# Patient Record
Sex: Female | Born: 1940
Health system: Southern US, Community
[De-identification: ages and names within clinical notes are randomized; demographics above are authoritative.]

## PROBLEM LIST (undated history)

## (undated) DIAGNOSIS — E669 Obesity, unspecified: Secondary | ICD-10-CM

## (undated) DIAGNOSIS — I10 Essential (primary) hypertension: Secondary | ICD-10-CM

## (undated) DIAGNOSIS — I482 Chronic atrial fibrillation, unspecified: Secondary | ICD-10-CM

## (undated) DIAGNOSIS — I2699 Other pulmonary embolism without acute cor pulmonale: Secondary | ICD-10-CM

## (undated) DIAGNOSIS — M1990 Unspecified osteoarthritis, unspecified site: Secondary | ICD-10-CM

## (undated) HISTORY — PX: ABDOMINAL HYSTERECTOMY: SHX81

## (undated) HISTORY — DX: Other pulmonary embolism without acute cor pulmonale: I26.99

## (undated) HISTORY — PX: CHOLECYSTECTOMY: SHX55

## (undated) HISTORY — DX: Essential (primary) hypertension: I10

## (undated) HISTORY — DX: Unspecified osteoarthritis, unspecified site: M19.90

## (undated) HISTORY — PX: JOINT REPLACEMENT: SHX530

## (undated) HISTORY — DX: Obesity, unspecified: E66.9

---

## 1999-02-25 ENCOUNTER — Encounter: Payer: Self-pay | Admitting: Orthopedic Surgery

## 1999-02-28 ENCOUNTER — Inpatient Hospital Stay (HOSPITAL_COMMUNITY): Admission: RE | Admit: 1999-02-28 | Discharge: 1999-03-04 | Payer: Self-pay | Admitting: Orthopedic Surgery

## 1999-03-04 ENCOUNTER — Inpatient Hospital Stay (HOSPITAL_COMMUNITY)
Admission: RE | Admit: 1999-03-04 | Discharge: 1999-03-14 | Payer: Self-pay | Admitting: Physical Medicine & Rehabilitation

## 1999-05-24 ENCOUNTER — Encounter: Admission: RE | Admit: 1999-05-24 | Discharge: 1999-08-22 | Payer: Self-pay | Admitting: Orthopedic Surgery

## 2000-03-08 ENCOUNTER — Encounter: Admission: RE | Admit: 2000-03-08 | Discharge: 2000-03-08 | Payer: Self-pay | Admitting: Orthopedic Surgery

## 2000-03-08 ENCOUNTER — Encounter: Payer: Self-pay | Admitting: Orthopedic Surgery

## 2000-05-24 ENCOUNTER — Encounter: Payer: Self-pay | Admitting: Orthopedic Surgery

## 2000-05-28 ENCOUNTER — Inpatient Hospital Stay (HOSPITAL_COMMUNITY): Admission: RE | Admit: 2000-05-28 | Discharge: 2000-06-02 | Payer: Self-pay | Admitting: Orthopedic Surgery

## 2000-07-12 ENCOUNTER — Encounter: Admission: RE | Admit: 2000-07-12 | Discharge: 2000-08-16 | Payer: Self-pay | Admitting: Orthopedic Surgery

## 2002-10-06 ENCOUNTER — Encounter: Payer: Self-pay | Admitting: Family Medicine

## 2002-10-06 ENCOUNTER — Encounter: Admission: RE | Admit: 2002-10-06 | Discharge: 2002-10-06 | Payer: Self-pay | Admitting: Family Medicine

## 2003-10-27 ENCOUNTER — Encounter: Admission: RE | Admit: 2003-10-27 | Discharge: 2004-01-25 | Payer: Self-pay | Admitting: Family Medicine

## 2003-12-30 ENCOUNTER — Encounter (HOSPITAL_BASED_OUTPATIENT_CLINIC_OR_DEPARTMENT_OTHER): Admission: RE | Admit: 2003-12-30 | Discharge: 2004-03-29 | Payer: Self-pay | Admitting: Internal Medicine

## 2004-02-04 ENCOUNTER — Encounter: Admission: RE | Admit: 2004-02-04 | Discharge: 2004-02-04 | Payer: Self-pay | Admitting: Family Medicine

## 2004-03-23 ENCOUNTER — Encounter (HOSPITAL_BASED_OUTPATIENT_CLINIC_OR_DEPARTMENT_OTHER): Admission: RE | Admit: 2004-03-23 | Discharge: 2004-06-21 | Payer: Self-pay | Admitting: Internal Medicine

## 2004-08-09 ENCOUNTER — Encounter (HOSPITAL_BASED_OUTPATIENT_CLINIC_OR_DEPARTMENT_OTHER): Admission: RE | Admit: 2004-08-09 | Discharge: 2004-10-25 | Payer: Self-pay | Admitting: Internal Medicine

## 2004-09-22 ENCOUNTER — Encounter (INDEPENDENT_AMBULATORY_CARE_PROVIDER_SITE_OTHER): Payer: Self-pay | Admitting: *Deleted

## 2004-09-22 ENCOUNTER — Ambulatory Visit (HOSPITAL_COMMUNITY): Admission: RE | Admit: 2004-09-22 | Discharge: 2004-09-22 | Payer: Self-pay | Admitting: Gastroenterology

## 2004-11-07 ENCOUNTER — Encounter (HOSPITAL_BASED_OUTPATIENT_CLINIC_OR_DEPARTMENT_OTHER): Admission: RE | Admit: 2004-11-07 | Discharge: 2005-02-05 | Payer: Self-pay | Admitting: Surgery

## 2004-12-29 ENCOUNTER — Encounter: Admission: RE | Admit: 2004-12-29 | Discharge: 2004-12-29 | Payer: Self-pay | Admitting: Family Medicine

## 2005-01-09 ENCOUNTER — Emergency Department (HOSPITAL_COMMUNITY): Admission: EM | Admit: 2005-01-09 | Discharge: 2005-01-10 | Payer: Self-pay | Admitting: Emergency Medicine

## 2006-01-01 ENCOUNTER — Ambulatory Visit: Payer: Self-pay | Admitting: Family Medicine

## 2006-03-01 ENCOUNTER — Ambulatory Visit: Payer: Self-pay | Admitting: Family Medicine

## 2006-03-13 ENCOUNTER — Encounter: Admission: RE | Admit: 2006-03-13 | Discharge: 2006-03-13 | Payer: Self-pay | Admitting: Family Medicine

## 2006-03-13 LAB — HM DEXA SCAN: HM Dexa Scan: NORMAL

## 2006-04-26 ENCOUNTER — Ambulatory Visit: Payer: Self-pay | Admitting: Family Medicine

## 2006-05-09 ENCOUNTER — Ambulatory Visit: Payer: Self-pay

## 2006-05-09 ENCOUNTER — Encounter: Payer: Self-pay | Admitting: Internal Medicine

## 2006-06-26 ENCOUNTER — Ambulatory Visit: Payer: Self-pay | Admitting: Family Medicine

## 2006-07-02 ENCOUNTER — Encounter: Admission: RE | Admit: 2006-07-02 | Discharge: 2006-07-02 | Payer: Self-pay | Admitting: Family Medicine

## 2006-09-17 ENCOUNTER — Ambulatory Visit: Payer: Self-pay | Admitting: Family Medicine

## 2006-11-20 ENCOUNTER — Ambulatory Visit: Payer: Self-pay | Admitting: Family Medicine

## 2006-12-04 ENCOUNTER — Ambulatory Visit: Payer: Self-pay | Admitting: Family Medicine

## 2007-02-04 ENCOUNTER — Ambulatory Visit: Payer: Self-pay | Admitting: Family Medicine

## 2007-05-30 ENCOUNTER — Ambulatory Visit: Payer: Self-pay | Admitting: Family Medicine

## 2007-08-15 ENCOUNTER — Ambulatory Visit: Payer: Self-pay | Admitting: Family Medicine

## 2007-08-16 ENCOUNTER — Encounter: Admission: RE | Admit: 2007-08-16 | Discharge: 2007-08-16 | Payer: Self-pay | Admitting: Family Medicine

## 2007-10-17 ENCOUNTER — Ambulatory Visit: Payer: Self-pay | Admitting: Family Medicine

## 2007-12-05 ENCOUNTER — Ambulatory Visit: Payer: Self-pay | Admitting: Family Medicine

## 2008-01-20 ENCOUNTER — Ambulatory Visit: Payer: Self-pay | Admitting: Family Medicine

## 2008-03-26 ENCOUNTER — Ambulatory Visit: Payer: Self-pay | Admitting: Family Medicine

## 2008-05-04 ENCOUNTER — Ambulatory Visit: Payer: Self-pay | Admitting: Family Medicine

## 2008-05-20 ENCOUNTER — Ambulatory Visit: Payer: Self-pay | Admitting: Family Medicine

## 2008-08-12 ENCOUNTER — Ambulatory Visit: Payer: Self-pay | Admitting: Family Medicine

## 2008-11-11 ENCOUNTER — Ambulatory Visit: Payer: Self-pay | Admitting: Family Medicine

## 2008-12-07 ENCOUNTER — Ambulatory Visit: Payer: Self-pay | Admitting: Family Medicine

## 2009-02-09 ENCOUNTER — Ambulatory Visit: Payer: Self-pay | Admitting: Family Medicine

## 2009-03-22 ENCOUNTER — Ambulatory Visit: Payer: Self-pay | Admitting: Family Medicine

## 2009-04-26 ENCOUNTER — Ambulatory Visit: Payer: Self-pay | Admitting: Family Medicine

## 2009-06-08 ENCOUNTER — Ambulatory Visit: Payer: Self-pay | Admitting: Family Medicine

## 2009-09-07 ENCOUNTER — Ambulatory Visit: Payer: Self-pay | Admitting: Family Medicine

## 2009-12-06 ENCOUNTER — Other Ambulatory Visit: Admission: RE | Admit: 2009-12-06 | Discharge: 2009-12-06 | Payer: Self-pay | Admitting: Family Medicine

## 2009-12-06 ENCOUNTER — Ambulatory Visit: Payer: Self-pay | Admitting: Family Medicine

## 2009-12-06 LAB — HM PAP SMEAR: HM Pap smear: NEGATIVE

## 2010-02-28 ENCOUNTER — Ambulatory Visit: Payer: Self-pay | Admitting: Family Medicine

## 2010-04-07 ENCOUNTER — Ambulatory Visit: Payer: Self-pay | Admitting: Family Medicine

## 2010-05-16 LAB — HM MAMMOGRAPHY

## 2010-05-24 ENCOUNTER — Ambulatory Visit: Payer: Self-pay | Admitting: Family Medicine

## 2010-08-22 ENCOUNTER — Ambulatory Visit
Admission: RE | Admit: 2010-08-22 | Discharge: 2010-08-22 | Payer: Self-pay | Source: Home / Self Care | Attending: Family Medicine | Admitting: Family Medicine

## 2010-10-26 ENCOUNTER — Encounter (INDEPENDENT_AMBULATORY_CARE_PROVIDER_SITE_OTHER): Payer: Medicare Other | Admitting: Family Medicine

## 2010-10-26 DIAGNOSIS — I10 Essential (primary) hypertension: Secondary | ICD-10-CM

## 2010-10-26 DIAGNOSIS — Z79899 Other long term (current) drug therapy: Secondary | ICD-10-CM

## 2010-10-26 DIAGNOSIS — E7800 Pure hypercholesterolemia, unspecified: Secondary | ICD-10-CM

## 2010-10-26 DIAGNOSIS — E119 Type 2 diabetes mellitus without complications: Secondary | ICD-10-CM

## 2010-12-01 ENCOUNTER — Inpatient Hospital Stay (HOSPITAL_COMMUNITY)
Admission: EM | Admit: 2010-12-01 | Discharge: 2010-12-11 | DRG: 176 | Disposition: A | Discharge status: Home / Self Care | Payer: Medicare Other | Attending: Cardiology | Admitting: Cardiology

## 2010-12-01 ENCOUNTER — Ambulatory Visit (INDEPENDENT_AMBULATORY_CARE_PROVIDER_SITE_OTHER): Payer: Medicare Other | Admitting: Family Medicine

## 2010-12-01 ENCOUNTER — Emergency Department (HOSPITAL_COMMUNITY): Payer: Medicare Other

## 2010-12-01 DIAGNOSIS — R61 Generalized hyperhidrosis: Secondary | ICD-10-CM

## 2010-12-01 DIAGNOSIS — R000 Tachycardia, unspecified: Secondary | ICD-10-CM

## 2010-12-01 DIAGNOSIS — M6281 Muscle weakness (generalized): Secondary | ICD-10-CM

## 2010-12-01 DIAGNOSIS — Z7901 Long term (current) use of anticoagulants: Secondary | ICD-10-CM

## 2010-12-01 DIAGNOSIS — Z96659 Presence of unspecified artificial knee joint: Secondary | ICD-10-CM

## 2010-12-01 DIAGNOSIS — E669 Obesity, unspecified: Secondary | ICD-10-CM | POA: Diagnosis present

## 2010-12-01 DIAGNOSIS — E785 Hyperlipidemia, unspecified: Secondary | ICD-10-CM | POA: Diagnosis present

## 2010-12-01 DIAGNOSIS — I498 Other specified cardiac arrhythmias: Secondary | ICD-10-CM | POA: Diagnosis present

## 2010-12-01 DIAGNOSIS — Z87891 Personal history of nicotine dependence: Secondary | ICD-10-CM

## 2010-12-01 DIAGNOSIS — Z7982 Long term (current) use of aspirin: Secondary | ICD-10-CM

## 2010-12-01 DIAGNOSIS — I10 Essential (primary) hypertension: Secondary | ICD-10-CM | POA: Diagnosis present

## 2010-12-01 DIAGNOSIS — F411 Generalized anxiety disorder: Secondary | ICD-10-CM | POA: Diagnosis present

## 2010-12-01 DIAGNOSIS — E119 Type 2 diabetes mellitus without complications: Secondary | ICD-10-CM | POA: Diagnosis present

## 2010-12-01 DIAGNOSIS — D649 Anemia, unspecified: Secondary | ICD-10-CM | POA: Diagnosis present

## 2010-12-01 DIAGNOSIS — I2789 Other specified pulmonary heart diseases: Secondary | ICD-10-CM | POA: Diagnosis present

## 2010-12-01 DIAGNOSIS — Z79899 Other long term (current) drug therapy: Secondary | ICD-10-CM

## 2010-12-01 DIAGNOSIS — I2699 Other pulmonary embolism without acute cor pulmonale: Principal | ICD-10-CM | POA: Diagnosis present

## 2010-12-01 LAB — POCT CARDIAC MARKERS
CKMB, poc: 4.8 ng/mL (ref 1.0–8.0)
Myoglobin, poc: 98.6 ng/mL (ref 12–200)
Troponin i, poc: 0.64 ng/mL (ref 0.00–0.09)

## 2010-12-01 LAB — CBC
HCT: 33.9 % — ABNORMAL LOW (ref 36.0–46.0)
Hemoglobin: 11.2 g/dL — ABNORMAL LOW (ref 12.0–15.0)
MCH: 27.1 pg (ref 26.0–34.0)
MCHC: 33 g/dL (ref 30.0–36.0)
MCV: 82.1 fL (ref 78.0–100.0)
Platelets: 184 10*3/uL (ref 150–400)
RBC: 4.13 MIL/uL (ref 3.87–5.11)
RDW: 13.6 % (ref 11.5–15.5)
WBC: 7.2 10*3/uL (ref 4.0–10.5)

## 2010-12-01 LAB — COMPREHENSIVE METABOLIC PANEL
ALT: 22 U/L (ref 0–35)
AST: 25 U/L (ref 0–37)
Albumin: 3.7 g/dL (ref 3.5–5.2)
Alkaline Phosphatase: 94 U/L (ref 39–117)
BUN: 28 mg/dL — ABNORMAL HIGH (ref 6–23)
CO2: 26 mEq/L (ref 19–32)
Calcium: 9.6 mg/dL (ref 8.4–10.5)
Chloride: 102 mEq/L (ref 96–112)
Creatinine, Ser: 1.17 mg/dL (ref 0.4–1.2)
GFR calc non Af Amer: 45 mL/min — ABNORMAL LOW (ref >60)
Glucose, Bld: 96 mg/dL (ref 70–99)
Potassium: 3.8 mEq/L (ref 3.5–5.1)
Sodium: 139 mEq/L (ref 135–145)
Total Bilirubin: 0.2 mg/dL — ABNORMAL LOW (ref 0.3–1.2)
Total Protein: 7.5 g/dL (ref 6.0–8.3)

## 2010-12-01 LAB — BASIC METABOLIC PANEL
BUN: 28 mg/dL — ABNORMAL HIGH (ref 6–23)
CO2: 26 mEq/L (ref 19–32)
Calcium: 9.5 mg/dL (ref 8.4–10.5)
Chloride: 102 mEq/L (ref 96–112)
Creatinine, Ser: 1.16 mg/dL (ref 0.4–1.2)
GFR calc non Af Amer: 46 mL/min — ABNORMAL LOW (ref >60)
Glucose, Bld: 97 mg/dL (ref 70–99)
Potassium: 4 mEq/L (ref 3.5–5.1)
Sodium: 137 mEq/L (ref 135–145)

## 2010-12-01 LAB — CK TOTAL AND CKMB (NOT AT ARMC)
CK, MB: 4.4 ng/mL — ABNORMAL HIGH (ref 0.3–4.0)
Relative Index: 2.3 (ref 0.0–2.5)
Total CK: 194 U/L — ABNORMAL HIGH (ref 7–177)

## 2010-12-01 LAB — PROTIME-INR
INR: 0.92 (ref 0.00–1.49)
Prothrombin Time: 12.6 seconds (ref 11.6–15.2)

## 2010-12-01 LAB — DIFFERENTIAL
Basophils Absolute: 0 10*3/uL (ref 0.0–0.1)
Basophils Relative: 0 % (ref 0–1)
Eosinophils Absolute: 0.1 10*3/uL (ref 0.0–0.7)
Eosinophils Relative: 1 % (ref 0–5)
Lymphocytes Relative: 40 % (ref 12–46)
Lymphs Abs: 2.9 10*3/uL (ref 0.7–4.0)
Monocytes Absolute: 0.9 10*3/uL (ref 0.1–1.0)
Monocytes Relative: 12 % (ref 3–12)
Neutro Abs: 3.3 10*3/uL (ref 1.7–7.7)
Neutrophils Relative %: 46 % (ref 43–77)

## 2010-12-01 LAB — MAGNESIUM: Magnesium: 1.8 mg/dL (ref 1.5–2.5)

## 2010-12-01 LAB — GLUCOSE, CAPILLARY: Glucose-Capillary: 152 mg/dL — ABNORMAL HIGH (ref 70–99)

## 2010-12-01 LAB — TROPONIN I

## 2010-12-01 LAB — MRSA PCR SCREENING

## 2010-12-01 LAB — HEMOGLOBIN A1C: Mean Plasma Glucose: 140 mg/dL — ABNORMAL HIGH (ref <117)

## 2010-12-01 LAB — APTT: aPTT: 27 seconds (ref 24–37)

## 2010-12-01 LAB — TSH: TSH: 1.554 u[IU]/mL (ref 0.350–4.500)

## 2010-12-02 ENCOUNTER — Inpatient Hospital Stay (HOSPITAL_COMMUNITY): Payer: Medicare Other

## 2010-12-02 LAB — CARDIAC PANEL(CRET KIN+CKTOT+MB+TROPI)
CK, MB: 3.5 ng/mL (ref 0.3–4.0)
CK, MB: 2.7 ng/mL (ref 0.3–4.0)
Relative Index: 2.1 (ref 0.0–2.5)
Relative Index: 1.7 (ref 0.0–2.5)
Total CK: 165 U/L (ref 7–177)
Total CK: 156 U/L (ref 7–177)
Troponin I: 0.39 ng/mL (ref <0.30)

## 2010-12-02 LAB — CBC
HCT: 29.9 % — ABNORMAL LOW (ref 36.0–46.0)
Hemoglobin: 9.9 g/dL — ABNORMAL LOW (ref 12.0–15.0)
MCH: 27.2 pg (ref 26.0–34.0)
MCHC: 33.1 g/dL (ref 30.0–36.0)
MCV: 82.1 fL (ref 78.0–100.0)
Platelets: 172 10*3/uL (ref 150–400)
RBC: 3.64 MIL/uL — ABNORMAL LOW (ref 3.87–5.11)
RDW: 13.6 % (ref 11.5–15.5)
WBC: 4.6 10*3/uL (ref 4.0–10.5)

## 2010-12-02 LAB — FERRITIN: Ferritin: 74 ng/mL (ref 10–291)

## 2010-12-02 LAB — URINE MICROSCOPIC-ADD ON

## 2010-12-02 LAB — URINALYSIS, ROUTINE W REFLEX MICROSCOPIC
Bilirubin Urine: NEGATIVE
Glucose, UA: NEGATIVE mg/dL
Hgb urine dipstick: NEGATIVE
Ketones, ur: NEGATIVE mg/dL
Nitrite: NEGATIVE
Protein, ur: NEGATIVE mg/dL
Specific Gravity, Urine: 1.019 (ref 1.005–1.030)
Urobilinogen, UA: 0.2 mg/dL (ref 0.0–1.0)
pH: 5.5 (ref 5.0–8.0)

## 2010-12-02 LAB — GLUCOSE, CAPILLARY
Glucose-Capillary: 102 mg/dL — ABNORMAL HIGH (ref 70–99)
Glucose-Capillary: 100 mg/dL — ABNORMAL HIGH (ref 70–99)
Glucose-Capillary: 110 mg/dL — ABNORMAL HIGH (ref 70–99)

## 2010-12-02 LAB — LIPID PANEL
Cholesterol: 162 mg/dL (ref 0–200)
HDL: 59 mg/dL (ref >39)
LDL Cholesterol: 94 mg/dL (ref 0–99)
Total CHOL/HDL Ratio: 2.7 RATIO
Triglycerides: 45 mg/dL (ref <150)
VLDL: 9 mg/dL (ref 0–40)

## 2010-12-02 LAB — BASIC METABOLIC PANEL
BUN: 26 mg/dL — ABNORMAL HIGH (ref 6–23)
CO2: 27 mEq/L (ref 19–32)
Calcium: 9 mg/dL (ref 8.4–10.5)
Chloride: 106 mEq/L (ref 96–112)
Creatinine, Ser: 1.03 mg/dL (ref 0.4–1.2)
GFR calc non Af Amer: 53 mL/min — ABNORMAL LOW (ref >60)
Glucose, Bld: 106 mg/dL — ABNORMAL HIGH (ref 70–99)
Potassium: 3.8 mEq/L (ref 3.5–5.1)
Sodium: 139 mEq/L (ref 135–145)

## 2010-12-02 LAB — IRON AND TIBC
Iron: 62 ug/dL (ref 42–135)
Saturation Ratios: 22 % (ref 20–55)
TIBC: 281 ug/dL (ref 250–470)
UIBC: 219 ug/dL

## 2010-12-02 LAB — HEPARIN LEVEL (UNFRACTIONATED): Heparin Unfractionated: 0.31 IU/mL (ref 0.30–0.70)

## 2010-12-02 LAB — VITAMIN B12: Vitamin B-12: 1348 pg/mL — ABNORMAL HIGH (ref 211–911)

## 2010-12-02 LAB — D-DIMER, QUANTITATIVE

## 2010-12-02 LAB — FOLATE: Folate: 11.5 ng/mL

## 2010-12-02 LAB — APTT

## 2010-12-02 MED ORDER — XENON XE 133 GAS
10.0000 | GAS_FOR_INHALATION | Freq: Once | RESPIRATORY_TRACT | Status: AC | PRN
  Administered 2010-12-02: 10 via RESPIRATORY_TRACT

## 2010-12-02 MED ORDER — TECHNETIUM TO 99M ALBUMIN AGGREGATED
6.0000 | Freq: Once | INTRAVENOUS | Status: AC | PRN
  Administered 2010-12-02: 6 via INTRAVENOUS

## 2010-12-03 LAB — CBC
HCT: 29.6 % — ABNORMAL LOW (ref 36.0–46.0)
Hemoglobin: 9.6 g/dL — ABNORMAL LOW (ref 12.0–15.0)
MCH: 26.8 pg (ref 26.0–34.0)
MCHC: 32.4 g/dL (ref 30.0–36.0)
MCV: 82.7 fL (ref 78.0–100.0)
Platelets: 163 10*3/uL (ref 150–400)
RBC: 3.58 MIL/uL — ABNORMAL LOW (ref 3.87–5.11)
RDW: 13.7 % (ref 11.5–15.5)
WBC: 4.5 10*3/uL (ref 4.0–10.5)

## 2010-12-03 LAB — PROTIME-INR
INR: 1.01 (ref 0.00–1.49)
Prothrombin Time: 13.5 seconds (ref 11.6–15.2)

## 2010-12-03 LAB — GLUCOSE, CAPILLARY
Glucose-Capillary: 127 mg/dL — ABNORMAL HIGH (ref 70–99)
Glucose-Capillary: 123 mg/dL — ABNORMAL HIGH (ref 70–99)
Glucose-Capillary: 122 mg/dL — ABNORMAL HIGH (ref 70–99)
Glucose-Capillary: 81 mg/dL (ref 70–99)
Glucose-Capillary: 99 mg/dL (ref 70–99)
Glucose-Capillary: 94 mg/dL (ref 70–99)

## 2010-12-03 LAB — HEPARIN LEVEL (UNFRACTIONATED): Heparin Unfractionated: 0.34 IU/mL (ref 0.30–0.70)

## 2010-12-04 DIAGNOSIS — I2699 Other pulmonary embolism without acute cor pulmonale: Secondary | ICD-10-CM

## 2010-12-04 LAB — CBC
HCT: 28.9 % — ABNORMAL LOW (ref 36.0–46.0)
Hemoglobin: 9.6 g/dL — ABNORMAL LOW (ref 12.0–15.0)
MCH: 27.4 pg (ref 26.0–34.0)
MCHC: 33.2 g/dL (ref 30.0–36.0)
MCV: 82.3 fL (ref 78.0–100.0)
Platelets: 167 10*3/uL (ref 150–400)
RBC: 3.51 MIL/uL — ABNORMAL LOW (ref 3.87–5.11)
RDW: 13.6 % (ref 11.5–15.5)
WBC: 5.3 10*3/uL (ref 4.0–10.5)

## 2010-12-04 LAB — PROTIME-INR
INR: 1.03 (ref 0.00–1.49)
Prothrombin Time: 13.7 seconds (ref 11.6–15.2)

## 2010-12-04 LAB — GLUCOSE, CAPILLARY: Glucose-Capillary: 95 mg/dL (ref 70–99)

## 2010-12-04 LAB — HEMOCCULT GUIAC POC 1CARD (OFFICE)
Fecal Occult Bld: NEGATIVE
Fecal Occult Bld: NEGATIVE

## 2010-12-04 LAB — HEPARIN LEVEL (UNFRACTIONATED): Heparin Unfractionated: 0.48 IU/mL (ref 0.30–0.70)

## 2010-12-05 LAB — BASIC METABOLIC PANEL
BUN: 17 mg/dL (ref 6–23)
CO2: 25 mEq/L (ref 19–32)
Calcium: 8.9 mg/dL (ref 8.4–10.5)
Chloride: 103 mEq/L (ref 96–112)
Creatinine, Ser: 0.76 mg/dL (ref 0.4–1.2)
GFR calc Af Amer: >60 mL/min (ref >60)
GFR calc non Af Amer: >60 mL/min (ref >60)
Glucose, Bld: 85 mg/dL (ref 70–99)
Potassium: 4.2 mEq/L (ref 3.5–5.1)
Sodium: 136 mEq/L (ref 135–145)

## 2010-12-05 LAB — GLUCOSE, CAPILLARY
Glucose-Capillary: 91 mg/dL (ref 70–99)
Glucose-Capillary: 106 mg/dL — ABNORMAL HIGH (ref 70–99)
Glucose-Capillary: 90 mg/dL (ref 70–99)

## 2010-12-05 LAB — CBC
HCT: 29.9 % — ABNORMAL LOW (ref 36.0–46.0)
Hemoglobin: 9.7 g/dL — ABNORMAL LOW (ref 12.0–15.0)
MCH: 26.9 pg (ref 26.0–34.0)
MCHC: 32.4 g/dL (ref 30.0–36.0)
MCV: 82.8 fL (ref 78.0–100.0)
Platelets: 164 10*3/uL (ref 150–400)
RBC: 3.61 MIL/uL — ABNORMAL LOW (ref 3.87–5.11)
RDW: 13.5 % (ref 11.5–15.5)
WBC: 5.2 10*3/uL (ref 4.0–10.5)

## 2010-12-05 LAB — HEPARIN LEVEL (UNFRACTIONATED): Heparin Unfractionated: 0.64 IU/mL (ref 0.30–0.70)

## 2010-12-05 LAB — HEMOCCULT GUIAC POC 1CARD (OFFICE): Fecal Occult Bld: POSITIVE

## 2010-12-05 LAB — PROTIME-INR
INR: 1.16 (ref 0.00–1.49)
Prothrombin Time: 15 seconds (ref 11.6–15.2)

## 2010-12-06 LAB — PROTIME-INR
INR: 1.3 (ref 0.00–1.49)
Prothrombin Time: 16.4 seconds — ABNORMAL HIGH (ref 11.6–15.2)

## 2010-12-06 LAB — GLUCOSE, CAPILLARY
Glucose-Capillary: 87 mg/dL (ref 70–99)
Glucose-Capillary: 90 mg/dL (ref 70–99)
Glucose-Capillary: 104 mg/dL — ABNORMAL HIGH (ref 70–99)
Glucose-Capillary: 111 mg/dL — ABNORMAL HIGH (ref 70–99)

## 2010-12-06 LAB — HEMOCCULT GUIAC POC 1CARD (OFFICE): Fecal Occult Bld: NEGATIVE

## 2010-12-06 LAB — HEPARIN LEVEL (UNFRACTIONATED): Heparin Unfractionated: 0.69 IU/mL (ref 0.30–0.70)

## 2010-12-06 LAB — CBC
HCT: 30.9 % — ABNORMAL LOW (ref 36.0–46.0)
Hemoglobin: 10.2 g/dL — ABNORMAL LOW (ref 12.0–15.0)
MCH: 27.3 pg (ref 26.0–34.0)
MCHC: 33 g/dL (ref 30.0–36.0)
MCV: 82.6 fL (ref 78.0–100.0)
Platelets: 175 10*3/uL (ref 150–400)
RBC: 3.74 MIL/uL — ABNORMAL LOW (ref 3.87–5.11)
RDW: 13.6 % (ref 11.5–15.5)
WBC: 5.4 10*3/uL (ref 4.0–10.5)

## 2010-12-06 NOTE — H&P (Signed)
NAMEGRACELEE, STEMMLER               ACCOUNT NO.:  [id number]  MEDICAL RECORD NO.:  [id number]           PATIENT TYPE:  I  LOCATION:  2916                         FACILITY:  [facility]  PHYSICIAN:  Landry Corporal, MDDATE OF BIRTH:  19-Dec-1940  DATE OF ADMISSION:  12/01/2010 DATE OF DISCHARGE:                             HISTORY & PHYSICAL   CHIEF COMPLAINT:  She did not feel well today, fatigue.  HISTORY OF PRESENT ILLNESS:  A 70 year old African American female was seen by Dr. Susann Givens today due to not feeling well and fatigue, weakness. In the office, she was found to be tachycardic.  Labs were done including troponin, which was elevated at 1.22.  At this point, she was sent to the emergency room for further evaluation and we were called. She also was so weak, she had some loss of balance type complaints.  PAST MEDICAL HISTORY:  Positive for diabetes mellitus, hypertension, dyslipidemia.  She had right total knee replaced in 2001.  She also has obesity.  FAMILY HISTORY:  Mother died at 65, questionable cause.  Father died at 32.  He was in the nursing home, unsure the recent.  One brother with diabetes.  Other family members no coronary disease that she is aware of.  SOCIAL HISTORY:  She is widowed.  She has three sons and four grandchildren.  No tobacco, she stopped 25 years ago.  No alcohol.  No street drugs.  She is active.  ALLERGIES:  No drug allergies.  OUTPATIENT MEDS:  She does not know the dosages.  She is on calcium, iron, Avalide, metformin, Lipitor, reserpine, vitamin B12, and Centrum Silver.  REVIEW OF SYSTEMS:  GENERAL:  No colds or fevers, generalized weakness today and not feeling well. NEUROLOGIC:  Weakness, little off balance today, but no syncope. GU:  No hematuria or dysuria. GI:  No diarrhea, constipation, or melena. MUSCULOSKELETAL:  No problems since her knee replacement. CARDIOVASCULAR:  Denies chest pain. PULMONARY:  Denies shortness of  breath. ENDOCRINE:  She is diabetic.  No thyroid problems.  PHYSICAL EXAMINATION:  VITAL SIGNS:  Blood pressure 135/78, pulse 110, temperature 98.3, respirations 20, oxygen saturation on room air is 99%. GENERAL:  Alert, oriented, pleasant affect, African American female in no acute distress. HEENT:  Normocephalic.  Sclerae clear.  Pupils are equal, round, and reactive to light and accommodation. NECK:  Supple.  No JVD.  No bruits. LUNGS:  Clear to auscultation bilaterally.  No rales, rhonchi, or wheezes. HEART:  S1-S2, regular rate and rhythm, tachycardic, but no murmur, gallop or rub. ABDOMEN:  Obese, soft, nontender, positive bowel sounds.  Do not palpate liver, spleen or masses. EXTREMITIES:  1+ bilateral pedal edema, 2+ pedals bilaterally.  No femoral bruits. SKIN:  No rashes or ulcers, warm and dry with brisk capillary refill. NEUROLOGIC:  Alert and oriented x3.  Cranial nerves II through VIII grossly intact.  LABORATORY VALUES:  Sodium 137, potassium 4.0, chloride 102, CO2 26, glucose 97, BUN 28, creatinine 1.16, calcium 9.5.  Cardiac markers; CK- MB 4.8, troponin-I 0.64, myoglobin 98.6.  Hemoglobin 11.2, hematocrit 33.9, WBC 7.2, platelets 184, neutrophils 46, lymphs  40.  Chest x-ray is pending.  IMPRESSION: 1. Postive Troponin in the setting of atypical symptoms: Non-ST-elevation myocardial  infarction vs. PE. 2. Diabetes mellitus type 2. 3. Hypertension. 4. Dyslipidemia. 5. Anxiety.  PLAN:  Serial CK-MBs, D-Dimer, IV heparin, nitroglycerin paste as the patient is pain free.  Beta Blocker.   Dr. Herbie Baltimore did discuss cardiac catheterization with the patient depending upon her initial course.   We will make n.p.o. after midnight.  She is quite anxious about the cath; she will, however, watch a video on the heart cath and hopefully plan to proceed.  Dr. Herbie Baltimore also talked to the patient's son about the heart catheterization.  We will admit to step-down bed and further  evaluate her.    Darcella Gasman. Annie Paras, N.P.   ______________________________ Landry Corporal, MD    LRI/MEDQ  D:  12/01/2010  T:  12/02/2010  Job:  [id number]  cc:   Sharlot Gowda, M.D.  Electronically Signed by Nada Boozer N.P. on 12/02/2010 03:14:45 PM Electronically Signed by Bryan Lemma MD on 12/06/2010 04:54:09 AM

## 2010-12-07 LAB — CBC
HCT: 28.6 % — ABNORMAL LOW (ref 36.0–46.0)
Hemoglobin: 9.6 g/dL — ABNORMAL LOW (ref 12.0–15.0)
MCH: 27.8 pg (ref 26.0–34.0)
MCHC: 33.6 g/dL (ref 30.0–36.0)
MCV: 82.9 fL (ref 78.0–100.0)
Platelets: 168 10*3/uL (ref 150–400)
RBC: 3.45 MIL/uL — ABNORMAL LOW (ref 3.87–5.11)
RDW: 13.7 % (ref 11.5–15.5)
WBC: 5.1 10*3/uL (ref 4.0–10.5)

## 2010-12-07 LAB — GLUCOSE, CAPILLARY
Glucose-Capillary: 86 mg/dL (ref 70–99)
Glucose-Capillary: 90 mg/dL (ref 70–99)
Glucose-Capillary: 96 mg/dL (ref 70–99)
Glucose-Capillary: 93 mg/dL (ref 70–99)

## 2010-12-07 LAB — PROTIME-INR
INR: 1.56 — ABNORMAL HIGH (ref 0.00–1.49)
Prothrombin Time: 18.9 seconds — ABNORMAL HIGH (ref 11.6–15.2)

## 2010-12-07 LAB — HEPARIN LEVEL (UNFRACTIONATED)
Heparin Unfractionated: 0.84 IU/mL — ABNORMAL HIGH (ref 0.30–0.70)
Heparin Unfractionated: 0.71 IU/mL — ABNORMAL HIGH (ref 0.30–0.70)

## 2010-12-08 LAB — CBC
HCT: 30.8 % — ABNORMAL LOW (ref 36.0–46.0)
Hemoglobin: 9.9 g/dL — ABNORMAL LOW (ref 12.0–15.0)
MCH: 26.5 pg (ref 26.0–34.0)
MCHC: 32.1 g/dL (ref 30.0–36.0)
MCV: 82.6 fL (ref 78.0–100.0)
Platelets: 179 10*3/uL (ref 150–400)
RBC: 3.73 MIL/uL — ABNORMAL LOW (ref 3.87–5.11)
RDW: 13.7 % (ref 11.5–15.5)
WBC: 4.8 10*3/uL (ref 4.0–10.5)

## 2010-12-08 LAB — GLUCOSE, CAPILLARY
Glucose-Capillary: 90 mg/dL (ref 70–99)
Glucose-Capillary: 130 mg/dL — ABNORMAL HIGH (ref 70–99)
Glucose-Capillary: 86 mg/dL (ref 70–99)
Glucose-Capillary: 97 mg/dL (ref 70–99)

## 2010-12-08 LAB — BASIC METABOLIC PANEL
BUN: 16 mg/dL (ref 6–23)
CO2: 28 mEq/L (ref 19–32)
Calcium: 9.5 mg/dL (ref 8.4–10.5)
Chloride: 105 mEq/L (ref 96–112)
Creatinine, Ser: 0.82 mg/dL (ref 0.4–1.2)
GFR calc Af Amer: >60 mL/min (ref >60)
GFR calc non Af Amer: >60 mL/min (ref >60)
Glucose, Bld: 86 mg/dL (ref 70–99)
Potassium: 4.3 mEq/L (ref 3.5–5.1)
Sodium: 140 mEq/L (ref 135–145)

## 2010-12-08 LAB — PROTIME-INR
INR: 1.56 — ABNORMAL HIGH (ref 0.00–1.49)
Prothrombin Time: 18.9 seconds — ABNORMAL HIGH (ref 11.6–15.2)

## 2010-12-08 LAB — HEPARIN LEVEL (UNFRACTIONATED): Heparin Unfractionated: 0.41 IU/mL (ref 0.30–0.70)

## 2010-12-09 LAB — CBC
HCT: 31 % — ABNORMAL LOW (ref 36.0–46.0)
Hemoglobin: 10.2 g/dL — ABNORMAL LOW (ref 12.0–15.0)
MCH: 27 pg (ref 26.0–34.0)
MCHC: 32.9 g/dL (ref 30.0–36.0)
MCV: 82 fL (ref 78.0–100.0)
Platelets: 167 10*3/uL (ref 150–400)
RBC: 3.78 MIL/uL — ABNORMAL LOW (ref 3.87–5.11)
RDW: 13.7 % (ref 11.5–15.5)
WBC: 5 10*3/uL (ref 4.0–10.5)

## 2010-12-09 LAB — GLUCOSE, CAPILLARY
Glucose-Capillary: 92 mg/dL (ref 70–99)
Glucose-Capillary: 95 mg/dL (ref 70–99)
Glucose-Capillary: 89 mg/dL (ref 70–99)
Glucose-Capillary: 99 mg/dL (ref 70–99)

## 2010-12-09 LAB — PROTIME-INR
INR: 1.88 — ABNORMAL HIGH (ref 0.00–1.49)
Prothrombin Time: 21.8 seconds — ABNORMAL HIGH (ref 11.6–15.2)

## 2010-12-09 LAB — HEPARIN LEVEL (UNFRACTIONATED)
Heparin Unfractionated: 0.15 IU/mL — ABNORMAL LOW (ref 0.30–0.70)
Heparin Unfractionated: 0.42 IU/mL (ref 0.30–0.70)

## 2010-12-10 LAB — GLUCOSE, CAPILLARY
Glucose-Capillary: 95 mg/dL (ref 70–99)
Glucose-Capillary: 93 mg/dL (ref 70–99)
Glucose-Capillary: 126 mg/dL — ABNORMAL HIGH (ref 70–99)
Glucose-Capillary: 97 mg/dL (ref 70–99)

## 2010-12-10 LAB — HEPARIN LEVEL (UNFRACTIONATED): Heparin Unfractionated: 0.37 IU/mL (ref 0.30–0.70)

## 2010-12-10 LAB — CBC
HCT: 31.4 % — ABNORMAL LOW (ref 36.0–46.0)
Hemoglobin: 10.3 g/dL — ABNORMAL LOW (ref 12.0–15.0)
MCH: 26.9 pg (ref 26.0–34.0)
MCHC: 32.8 g/dL (ref 30.0–36.0)
MCV: 82 fL (ref 78.0–100.0)
Platelets: 187 10*3/uL (ref 150–400)
RBC: 3.83 MIL/uL — ABNORMAL LOW (ref 3.87–5.11)
RDW: 13.8 % (ref 11.5–15.5)
WBC: 5.3 10*3/uL (ref 4.0–10.5)

## 2010-12-10 LAB — PROTIME-INR
INR: 2.08 — ABNORMAL HIGH (ref 0.00–1.49)
Prothrombin Time: 23.5 seconds — ABNORMAL HIGH (ref 11.6–15.2)

## 2010-12-11 LAB — CBC
HCT: 30.8 % — ABNORMAL LOW (ref 36.0–46.0)
Hemoglobin: 10 g/dL — ABNORMAL LOW (ref 12.0–15.0)
MCH: 26.8 pg (ref 26.0–34.0)
MCHC: 32.5 g/dL (ref 30.0–36.0)
MCV: 82.6 fL (ref 78.0–100.0)
Platelets: 198 10*3/uL (ref 150–400)
RBC: 3.73 MIL/uL — ABNORMAL LOW (ref 3.87–5.11)
RDW: 13.9 % (ref 11.5–15.5)
WBC: 4.6 10*3/uL (ref 4.0–10.5)

## 2010-12-11 LAB — PROTIME-INR
INR: 2.09 — ABNORMAL HIGH (ref 0.00–1.49)
Prothrombin Time: 23.6 seconds — ABNORMAL HIGH (ref 11.6–15.2)

## 2010-12-11 LAB — GLUCOSE, CAPILLARY
Glucose-Capillary: 91 mg/dL (ref 70–99)
Glucose-Capillary: 89 mg/dL (ref 70–99)

## 2010-12-14 ENCOUNTER — Encounter: Payer: Self-pay | Admitting: Family Medicine

## 2010-12-14 ENCOUNTER — Ambulatory Visit (INDEPENDENT_AMBULATORY_CARE_PROVIDER_SITE_OTHER): Payer: Medicare Other | Admitting: Family Medicine

## 2010-12-14 VITALS — BP 104/70 | HR 62

## 2010-12-14 DIAGNOSIS — E118 Type 2 diabetes mellitus with unspecified complications: Secondary | ICD-10-CM | POA: Insufficient documentation

## 2010-12-14 DIAGNOSIS — E119 Type 2 diabetes mellitus without complications: Secondary | ICD-10-CM

## 2010-12-14 DIAGNOSIS — I2699 Other pulmonary embolism without acute cor pulmonale: Secondary | ICD-10-CM

## 2010-12-14 DIAGNOSIS — E6609 Other obesity due to excess calories: Secondary | ICD-10-CM | POA: Insufficient documentation

## 2010-12-14 DIAGNOSIS — E669 Obesity, unspecified: Secondary | ICD-10-CM

## 2010-12-14 DIAGNOSIS — Z86711 Personal history of pulmonary embolism: Secondary | ICD-10-CM | POA: Insufficient documentation

## 2010-12-14 NOTE — Patient Instructions (Signed)
Return in the morning around 8:30 before you take your warfarin

## 2010-12-14 NOTE — Progress Notes (Signed)
  Subjective:    Patient ID: Kayla [name], female    DOB: 1941-07-03, 70 y.o.   MRN: [id number]  HPI she is here for a followup visit. She was recently hospitalized and found to have a pulmonary embolus. The hospital admission history and physical exam as well as discharge summary was reviewed. Evaluation showed no cause for the  Embolus She was placed on Lovenox and presently is on 10 mg of Coumadin. SHe continues on other medications listed in the chart. Presently she has no particular concerns or complaints. He did take her Coumadin 2 hours ago.    Review of Systems    negative except as above Objective:   Physical Exam alert and in no distress otherwise not examined        Assessment & Plan:  Pulmonary embolus. Diabetes. Obesity. Anemia. Hypertension. I spent over 25 minutes discussing her situation with her. Explained taking Coumadin for the next 6 months and monitoring her closely. She will return tomorrow for PT/INR before taking her medication.

## 2010-12-15 ENCOUNTER — Other Ambulatory Visit: Payer: Medicare Other

## 2010-12-15 DIAGNOSIS — Z7901 Long term (current) use of anticoagulants: Secondary | ICD-10-CM

## 2010-12-16 ENCOUNTER — Telehealth: Payer: Self-pay

## 2010-12-16 LAB — PROTIME-INR
INR: 1.47 (ref <1.50)
Prothrombin Time: 18 seconds — ABNORMAL HIGH (ref 11.6–15.2)

## 2010-12-16 NOTE — Op Note (Signed)
[place]. [facility]  Patient:    Kayla [name], Kayla [name]                      MRN: [id number] Proc. Date: 05/28/00 Adm. Date:  [id number] Attending:  Carolan Shiver Ii                           Operative Report  PREOPERATIVE DIAGNOSIS:  Failed tibial component right total knee.  SURGICAL PROCEDURE:  Revision tibial component LCS right total knee.  POSTOPERATIVE DIAGNOSIS:  Failed tibial component right total knee.  SURGEON:  John L. Dorothyann Gibbs, M.D.  ASSISTANT:  Jamelle Rushing, P.A.-C.  ANESTHESIA:  General.  PATHOLOGY:  The patient is a little over a year out from right total knee. She weighs 265.  The medial tibia has sunk in a good cm to cm and half shifting the cement mantle in the proximal tibial metaphysis.  Patient is symptomatic from this and complains of pain with weightbearing.  The leg is in more varus.  PROCEDURE:  Under general anesthesia, the right leg was prepared with Duraprep and draped as a sterile field.  Tourniquet and Esmarch were used.  The leg was wrapped out with the Esmarch and the tourniquet was used at 400 mm.  The previous skin incision was excised.  A medial parapatellar incision was reopened.  The patella was everted with care protecting the patellar tendon inferiorly, but releasing fat pad from the front of the tibia to roll out a ______  patella.  Once this was done, the tibial stem was osteotomized, that is the plastic rotating bearing stem was osteotomized to free up the joint space, removing the plastic bearing.  With this out of the way, and the medial tibial metaphysis exposed, peeling the deep fibers of the MCL from the tibia with the Key elevator, the tibial tray was exposed using osteotome and mallet to remove overhanging cortex tibia where the plate was sunken in.  A 1 inch osteotome was then placed beneath the tibial tray and smaller osteotome was used to lever it up and the tibial tray was removed in this way.   It leaves a cement mantle behind, shows overall to be solid, but was simply sunken in as a whole part.  Following this, a center peg hole was placed at the bottom of the tibial tray location.  An intramedullary tibial resection guide was used and the proximal tibia was resected to a level that the tibial tray had sunk.  In this manner, about 10 mm proximal tibia were removed.  After appropriate resection was completed, excessive remaining cement around the tibial stem was removed and care was taken to curet and remove any debris.  There is very sclerotic bone in the medial tibia and this was multiply drilled.  Following this, the tibia was sized to the standard, a center peg hole was placed, a canal finder was placed and reaming sequentially up to 14 mm was done, which gave it good cortical fit distally.  Once this was complete, trial seating of the revision component was done.  It took several tries to get this right, as it wanted to go a couple of mm off center and the tibial tray hole in the proximal cortex had to be enlarged slightly to seat the components satisfactorily.  Once this was done, the permanent component was readied, cement was mixed.  All appropriate irrigation with pulse  irrigation and antibiotic solution was done and the permanent component was seated, cementing the proximal tibial metaphysis only and not the 14.5 flutes that were seated distally.  Once this was completed and the cement was hardened, different bearing sizes had been tested and a 17.5 was the best fit.  A 17.5 bearing was installed and trial reduction revealed excellent fit, alignment and stability with the exception of some slight ligamentous laxity laterally and the small stitch was placed in the posterolateral corner that seems to tighten this up nicely with #2 Ethibond.  Once this was completed, cement was hardened, the tourniquet was let down, multiple small vessels were cauterized.  The knee  was taken through range of motion and found to be satisfactorily stable in both flexion and extension with balanced gaps and no instability of the bearing. The wound was then closed in layers with #1 Ti-Cron, 0 and 2-0 Vicryl and skin clips.  Tourniquet time was approximately 1 hour and 15 minutes.  Total time approximately 1 hour and 25-30 minutes. DD:  05/28/00 TD:  05/28/00 Job: [id number]

## 2010-12-16 NOTE — Consult Note (Signed)
NAME:  Kayla [name], Kayla [name]                         ACCOUNT NO.:  [id number]   MEDICAL RECORD NO.:  [id number]                   PATIENT TYPE:  REC   LOCATION:  FOOT                                 FACILITY:  [facility]   PHYSICIAN:  Jonelle Sports. Sevier, M.D.              DATE OF BIRTH:  1941-07-30   DATE OF CONSULTATION:  01/04/2004  DATE OF DISCHARGE:                                   CONSULTATION   HISTORY:  This 70 year old black female was seen at the courtesy of Dr.  Susann Givens for general diabetes foot care.   She apparently had no concerns about her feet until in May of this year when  she tripped over the corner of a loose carpet runner and fell, spraining her  left great toe. She was evaluated at time with x-rays which showed no  fracture and was treated with ice pack applications and elevations for the  first 48 hours.  Since that time, she has noted some persistence of pain,  not on a chronic basis but intermittently in that hallux and was concerned  it had not gone away.  She had in the meanwhile been referred her by Dr.  Susann Givens for general diabetes care and so wants [place] to address this issue as  well.   PAST MEDICAL HISTORY:  Notable for hypertension in addition to her diabetes.  She has no known medicinal allergies.  Her regular medications include  Lipitor, Ogen, hydrochlorothiazide, Reserpine, multivitamins, calcium, and  Glucophage.   PHYSICAL EXAMINATION:  Examination today is limited to the distal lower  extremities.  The patient's feet are minimally edematous bilaterally, and  skin temperatures are high-normal and symmetrical.  Pulses are faint but  palpable and are adequate on Doppler evaluation.   Considerable in-curving of the fifth toes bilaterally, and they are callused  on their plantar aspects.  She has in addition a callus on the tip of the  left second toe, and both heels are roughened and mildly callused.   The nail on the hallux on the left is ingrown at both of  its distal margins,  and this apparently was a source of discomfort to the patient.  In addition,  most of her nails throughout both feet are involved with a chronic  onychomycosis without periungual inflammation.   Monofilament testing shows that she lacks protective sensation throughout  both feet.   DISPOSITION:  1. The patient was given instruction regarding foot care and diabetes by     video with nurse and physician reinforcement.  2. Her foot wear is evaluated, and it is found that her dressy shoes are too     narrow and pointed toes, and it is recommended to the patient that she     make every effort to get into a more squared off toe if not an oxford     shoe.  3. The tips of the ingrown hallucal of the  left foot are trimmed away, and     this will hopefully give at least temporary relief.  4. There is some maceration between her toes dressed with application of     __________cream and the placement of lamb's wool as spacers.  5. The calluses underlying the fifth toes bilaterally as well as that at the     tip of the left second toe and those on the heels are dremeled without     incident.  6. The matter of possible use of healing sandals was discussed with the     patient, and she decided against this.  7. She is instructed to get Lamisil cream for use at home in the     interdictional spaces and to follow this with lamb's wool application.     She is advised to continue this for at least 10 days beyond any     clinically apparent infection.  8. Followup visit will be to this clinic on p.r.n. basis.                                               Jonelle Sports. Cheryll Cockayne, M.D.    RES/MEDQ  D:  01/04/2004  T:  01/05/2004  Job:  [id number]   cc:   Sharlot Gowda, M.D.  [address]  [place], [place] [address]  Fax: [phone]

## 2010-12-16 NOTE — Telephone Encounter (Signed)
Called pt let her know pt inr ok continue on med re check on monday

## 2010-12-16 NOTE — Discharge Summary (Signed)
[place]. [facility]  Patient:    Kayla [name], Kayla [name]                      MRN: [id number] Adm. Date:  [id number] Disc. Date: [id number] Attending:  Carolan Shiver Ii Dictator:   Arnoldo Morale, P.A. CC:         Ronnald Nian, M.D.  Dr. Meredeth Ide   Discharge Summary  ADMISSION DIAGNOSES: 1. 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 2. Morbid obesity. 3. Hypertension. 4. Anemia.  DISCHARGE DIAGNOSES: 1. 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 2. Morbid obesity. 3. Hypertension. 4. Anemia. 5. Post hemorrhagic anemia. 6. Constipation.  SURGICAL PROCEDURE:  On May 28, 2000, [name] underwent a revision of the tibial component of her right total knee arthroplasty by Jonny Ruiz L. Dorothyann Gibbs, M.D.  COMPLICATIONS:  None.  CONSULTS: 1. Pharmacy consult for Coumadin therapy on May 28, 2000. 2. Physical therapy consult on May 29, 2000. 3. Occupational therapy consult on May 30, 2000.  HISTORY OF PRESENT ILLNESS:  This 70 year old black female presented to [facility], M.D., with a history of a right total knee arthroplasty on February 28, 2000.  She had complaints of increasing achiness and stiffness in her right knee over the last several months with prolonged sitting or standing. On x-rays on a routine chest, she was found to have some collapse of the tibial tray.  She is now being admitted for a revision of the tibial component of her right total knee.  HOSPITAL COURSE:  [name] tolerated her surgical procedure well without immediate complications.  She was started on Coumadin per protocol and transferred to 4700.  On postoperative day #1, the temperature maximum was 101.4 degrees, and vitals stable.  The hemoglobin was 9.7 with a hematocrit of 28.  The right leg was neurovascularly intact.  Dressing intact.  She was complaining of some muscle spasm, was started on medications for that, and  was doing well.  On postoperative day #2, the temperature maximum was 99.3 degrees, vital signs stable, hemoglobin 9.2, and hematocrit 27.1.  The right knee incision was well approximated with staples.  A moderate amount of blood drainage.  Leg neurovascularly intact.  She was switched from IV medications to p.o. medications and continued on therapy per protocol.  On postoperative day #3, she continued to do well, temperature maximum 99.7 degrees, vitals stable, wound unchanged, hemoglobin 9.5, and hematocrit 28.2.  She was given a laxative for complaints of constipation. She continued to do well over the next two days.  On June 02, 2000, she was ready for discharge home.  70 year old 1. She was to resume all prehospitalization medications and diet, except    Celebrex and Vicodin. 2. OxyContin 10 mg one tablet p.o. q.12h. (20 with no refill) OxyIR one to    two tablets p.o. q.4h. p.r.n. for pain (40 with no refill), and Coumadin    one tablet p.o. q.d. with the dose per pharmacy. 3. She is to be weightbearing as tolerated on the right leg with the use of a    walker. 4. She is to keep her right knee incision clean and dry and follow up with    John L. Dorothyann Gibbs, M.D., in our office on Monday, June 11, 2000.  She    is to call [phone] to set up that appointment. 5. She is arranged for home health physical therapy and home  health R.N. 6. She is to notify John L. Rendall III, M.D., of temperature greater than    101.5 degrees, chills, pain unrelieved by medications or foul-smelling    drainage from the wound.  She stated good understanding of these instructions and was subsequently discharged home.  LABORATORY DATA:  On May 24, 2000, the hemoglobin was 12 and hematocrit 34.5.  On May 29, 2000, hemoglobin 9.7 and hematocrit 28.  On May 30, 2000, hemoglobin 9.2 and hematocrit 27.1.  On May 31, 2000, hemoglobin 9.5 and hematocrit 28.2.  On June 01, 2000, hemoglobin 9.5 and hematocrit 26.9.  On May 24, 2000, the PT was 12.3, INR 0.9, and PTT 26.  On June 02, 2000, the PT was 13.7 and INR 1.1.  On May 29, 2000, her glucose was elevated at 137.  All other laboratory studies were within normal limits. DD:  06/27/00 TD:  06/27/00 Job: [id number]

## 2010-12-19 ENCOUNTER — Other Ambulatory Visit: Payer: Medicare Other

## 2010-12-19 DIAGNOSIS — Z7901 Long term (current) use of anticoagulants: Secondary | ICD-10-CM

## 2010-12-19 LAB — PROTIME-INR
INR: 1.84 — ABNORMAL HIGH (ref <1.50)
Prothrombin Time: 21.4 seconds — ABNORMAL HIGH (ref 11.6–15.2)

## 2010-12-20 ENCOUNTER — Telehealth: Payer: Self-pay

## 2010-12-20 NOTE — Telephone Encounter (Signed)
Pt informed of pt/inr called [facility] for 2mg  pill to add to the 10mg  she is takeing and she is to come back for pt/inr 5/29

## 2010-12-20 NOTE — Telephone Encounter (Signed)
Pt informed about pt/inr

## 2010-12-23 ENCOUNTER — Telehealth: Payer: Self-pay | Admitting: Family Medicine

## 2010-12-27 ENCOUNTER — Other Ambulatory Visit: Payer: Medicare Other

## 2010-12-27 DIAGNOSIS — Z7901 Long term (current) use of anticoagulants: Secondary | ICD-10-CM

## 2010-12-27 LAB — PROTIME-INR
INR: 2.88 — ABNORMAL HIGH (ref <1.50)
Prothrombin Time: 30.2 seconds — ABNORMAL HIGH (ref 11.6–15.2)

## 2010-12-27 NOTE — Telephone Encounter (Signed)
Called Kayla [name]  They should be getting her an apt

## 2010-12-29 ENCOUNTER — Telehealth: Payer: Self-pay

## 2010-12-29 NOTE — Telephone Encounter (Signed)
Pt informed of lab

## 2010-12-30 ENCOUNTER — Other Ambulatory Visit: Payer: Self-pay | Admitting: Family Medicine

## 2010-12-30 ENCOUNTER — Other Ambulatory Visit: Payer: Self-pay

## 2010-12-30 MED ORDER — WARFARIN SODIUM 2 MG PO TABS: 2.0000 mg | ORAL_TABLET | Freq: Every day | ORAL | Status: DC

## 2010-12-30 MED ORDER — CARVEDILOL 3.125 MG PO TABS: 3.1250 mg | ORAL_TABLET | Freq: Two times a day (BID) | ORAL | Status: DC

## 2010-12-30 MED ORDER — RESERPINE 0.1 MG PO TABS: 0.1000 mg | ORAL_TABLET | Freq: Every day | ORAL | Status: DC

## 2010-12-30 MED ORDER — LOSARTAN POTASSIUM 25 MG PO TABS: 25.0000 mg | ORAL_TABLET | Freq: Every day | ORAL | Status: DC

## 2010-12-30 MED ORDER — ATORVASTATIN CALCIUM 80 MG PO TABS: 80.0000 mg | ORAL_TABLET | Freq: Every day | ORAL | Status: DC

## 2010-12-30 MED ORDER — METFORMIN HCL 500 MG PO TABS: 500.0000 mg | ORAL_TABLET | Freq: Two times a day (BID) | ORAL | Status: DC

## 2010-12-30 MED ORDER — WARFARIN SODIUM 10 MG PO TABS: 10.0000 mg | ORAL_TABLET | Freq: Every day | ORAL | Status: DC

## 2011-01-01 NOTE — Discharge Summary (Signed)
Kayla [name], Kayla [name]               ACCOUNT NO.:  [id number]  MEDICAL RECORD NO.:  [id number]           PATIENT TYPE:  I  LOCATION:  2012                         FACILITY:  [facility]  PHYSICIAN:  Thurmon Fair, MD     DATE OF BIRTH:  Sep 26, 1940  DATE OF ADMISSION:  12/01/2010 DATE OF DISCHARGE:  12/11/2010                              DISCHARGE SUMMARY   DISCHARGE DIAGNOSES: 1. Acute pulmonary embolism, currently on Coumadin anticoagulation.     INR on discharge is 2.09.  The patient has had 24 hours of overlap     with Lovenox. 2. Increased troponin secondary to pulmonary embolism. 3. Diabetes mellitus, stable. 4. Anemia, stable.  She has had heme-negative stool checks, currently     on iron. 5. Bradycardia, stable.  HOSPITAL COURSE:  Mr. Cale is a 70 year old African American female who was originally seen up prior to admission by Dr. Susann Givens for fatigue and weakness.  Upon arriving to the office, she was found to be with intact cardiac.  Troponin was elevated at 1.22.  She was subsequently sent to the emergency room for further evaluation.  She was admitted.  Cardiac enzymes were cycled x3.  She was started on IV heparin and nitroglycerin paste.  She was made n.p.o. for a possible cardiac catheterization.  A D- dimer was also checked and found to be 10.99.  Subsequently had V/Q perfusion scan which revealed a large unmatched perfusion defect in the right upper lobe and right middle lobe.  This had high probability of acute pulmonary embolism.  Echocardiogram was also completed which showed normal left ventricle cavity size with mild LVH.  Systolic function was normal with an EF of 55-60% with normal wall motion.  Grade 1 diastolic dysfunction.  Peak PA pressures of 48 mmHg which was consistent with moderate pulmonary hypertension.  The patient was started on Coumadin and continued on IV heparin.  Her target INR goal was 2-3.  Initial chest x-ray showed no acute cardiopulmonary  disease and had normal cardiac silhouette and size.  Lower extremity arterial and venous Dopplers were ordered.  There was no evidence of deep vein or superficial thrombosis involving the right lower extremity or left lower extremity.  No evidence of Baker cyst in the right or left.  There was a plan to discharge the patient on Lovenox-Coumadin bridge, however, the patient adamantly requested to stay in the hospital that she could not give herself injections.  The patient's Lopressor was decreased and she was changed to Coreg 3.125 twice daily.  On discharge was noted to have a drop in her hemoglobin from the initially 11.2 to as low as 9.6.  She was checked for blood in her stool which was found to be negative. Hemoglobin has been stable.  Also of note, on two occasions she has complained of one hematoma on her left medial thigh which the patient had with sitting on the bed rail for quite some time and then noticed a large bruise and bump that approximately 3 cm in size.  Ice was applied and then subsequently the patient stated it felt better.  She also noted  a hematoma on her forearm.  This was after walking down the hall, she sat down and noticed that this was approximately 1-2 cm in size.  Ice was applied to that as well.  For the most part, the patient has been stable.  She has just been waiting her INR to become therapeutic and to have 24 hours of overlap, this is now concluded.  The patient has been seen by Dr. Royann Shivers and she is stable for discharge home.  She will return to our [facility] in approximately 3-4 days for INR check.  TIME SPENT:  Greater than 30 minutes were spent on discharge planning.  DISCHARGE LABORATORY DATA:  WBCs 4.6, hemoglobin 10.0, hematocrit 30.8, and platelets 198.  Pro-time 23.6 and INR 2.09.  Sodium 140, potassium 4.3, chloride 105, carbon dioxide 28, glucose 86, BUN 16, creatinine 0.82, and calcium 9.5.  Iron 62, total iron binding capacity was  281, saturation 22, UIBC 219, vitamin B12 was 1348, serum folate was 11.5, and ferritin was 74.  MRSA was negative.  Thyroid stimulating hormone was 1.554.  Total cholesterol 162, triglycerides 45, HDL 59, LDL 94, VLDL 9, and total cholesterol HDL ratio was 2.7.  Hemoglobin A1c was 6.5.  STUDIES/PROCEDURES: 1. Chest x-ray Dec 01, 2010 showed no acute cardiopulmonary disease.     Cardiac silhouette was normal in size. 2. Nuclear medicine ventilation perfusion scan, Dec 02, 2010 showed a     large unmatched perfusion defect within the right upper lobe and     right middle lobe.  High probably for acute pulmonary embolism. 3. Echocardiogram, Dec 02, 2010.  Study conclusion shows left     ventricle's cavity size was normal, wall thickness was increased,     and mild LVH.  There was mild concentric hypertrophy.  Systolic     function was normal with ejection fraction of 55-60%.  Normal wall     motion.  Grade 1 diastolic dysfunction noted.  The aortic valve     showed mildly calcified annulus.  Checked leaflets, mildly     calcified leaflets.  Mild regurgitation.  Mitral valve showed     mildly calcified annulus.  Mild regurgitation directed     eccentrically.  The right ventricle systolic pressure was     increased.  Atrial septum showed no defective or patent foramen     ovale.  Tricuspid valve showed moderate regurgitation.  Peak PA     pressures of 48 mmHg consistent with moderate pulmonary     hypertension. 4. Lower extremity venous Dopplers on Dec 04, 2010.  There was no     obvious evidence of deep vein or superficial thrombosis involving     the right lower extremity and left lower extremity.  No Baker cyst     on the right or left.  DISCHARGE MEDICATIONS: 1. Aspirin 325 mg enteric-coated 1 tablet by mouth daily. 2. Carvedilol 3.125 mg 1 tablet by mouth twice daily with meals. 3. Hydrochlorothiazide 12.5 mg 1 tablet by mouth daily. 4. Olmesartan 20 mg 1 tablet by mouth daily. 5.  Warfarin 10 mg by mouth daily. 6. Calcium carbonate/vitamin D over-the-counter 1 tablet by mouth     daily. 7. Ferrex 325 mg 1 tablet by mouth daily. 8. Lipitor 80 mg 1 tablet by mouth daily at bedtime. 9. Metformin 500 mg 1 tablet by mouth twice daily. 10.Multivitamin therapeutic 1 tablet by mouth daily. 11.Reserpine 0.1 mg 1 tablet by mouth daily. 12.Vitamin B12 over-the-counter 1 tablet by mouth  daily.  DISPOSITION:  Kayla [name] will be discharged home in stable condition.  It is recommended that she increase her activity slowly.  She should also eat a heart-healthy low-carb diet.  She will follow up with Dr. Herbie Baltimore in approximately 1-2 weeks and for an INR check in approximately 3 days and the office will call with the appointment times.    ______________________________ Wilburt Finlay, PA   ______________________________ Thurmon Fair, MD    BH/MEDQ  D:  12/11/2010  T:  12/11/2010  Job:  [id number]  cc:   Sharlot Gowda, M.D.  Electronically Signed by Wilburt Finlay PA on 12/19/2010 01:45:43 PM Electronically Signed by Thurmon Fair M.D. on 01/01/2011 08:65:78 PM

## 2011-01-04 ENCOUNTER — Encounter: Payer: Medicare Other | Attending: Family Medicine | Admitting: Dietician

## 2011-01-04 DIAGNOSIS — E119 Type 2 diabetes mellitus without complications: Secondary | ICD-10-CM | POA: Insufficient documentation

## 2011-01-04 DIAGNOSIS — Z713 Dietary counseling and surveillance: Secondary | ICD-10-CM | POA: Insufficient documentation

## 2011-01-04 DIAGNOSIS — E669 Obesity, unspecified: Secondary | ICD-10-CM | POA: Insufficient documentation

## 2011-01-10 ENCOUNTER — Telehealth: Payer: Self-pay | Admitting: Family Medicine

## 2011-01-10 ENCOUNTER — Telehealth: Payer: Self-pay

## 2011-01-10 NOTE — Telephone Encounter (Signed)
Pt needs to come in June 29 for nurse visit pt/inr check

## 2011-01-10 NOTE — Telephone Encounter (Signed)
Pt needs to come in June 29 for pt/inr

## 2011-01-10 NOTE — Telephone Encounter (Signed)
Pt called to find out when next appt was, advised none scheduled.  Stated Cheri told her she needed to come back but not sure when.  Please call pt and let her know when she is to return.  Thanks

## 2011-01-27 ENCOUNTER — Other Ambulatory Visit: Payer: Medicare Other

## 2011-01-27 DIAGNOSIS — Z7901 Long term (current) use of anticoagulants: Secondary | ICD-10-CM

## 2011-01-27 LAB — PROTIME-INR
INR: 3.15 — ABNORMAL HIGH (ref <1.50)
Prothrombin Time: 33.3 seconds — ABNORMAL HIGH (ref 11.6–15.2)

## 2011-01-30 ENCOUNTER — Telehealth: Payer: Self-pay

## 2011-01-30 NOTE — Telephone Encounter (Signed)
Pt informed of pt/inr to recheck in one month

## 2011-02-01 ENCOUNTER — Emergency Department (HOSPITAL_COMMUNITY): Payer: Medicare Other

## 2011-02-01 ENCOUNTER — Emergency Department (HOSPITAL_COMMUNITY)
Admission: EM | Admit: 2011-02-01 | Discharge: 2011-02-01 | Disposition: A | Discharge status: Home / Self Care | Payer: Medicare Other | Attending: Emergency Medicine | Admitting: Emergency Medicine

## 2011-02-01 DIAGNOSIS — R05 Cough: Secondary | ICD-10-CM | POA: Insufficient documentation

## 2011-02-01 DIAGNOSIS — Z79899 Other long term (current) drug therapy: Secondary | ICD-10-CM | POA: Insufficient documentation

## 2011-02-01 DIAGNOSIS — J3489 Other specified disorders of nose and nasal sinuses: Secondary | ICD-10-CM | POA: Insufficient documentation

## 2011-02-01 DIAGNOSIS — Z7982 Long term (current) use of aspirin: Secondary | ICD-10-CM | POA: Insufficient documentation

## 2011-02-01 DIAGNOSIS — Z7901 Long term (current) use of anticoagulants: Secondary | ICD-10-CM | POA: Insufficient documentation

## 2011-02-01 DIAGNOSIS — Z86718 Personal history of other venous thrombosis and embolism: Secondary | ICD-10-CM | POA: Insufficient documentation

## 2011-02-01 DIAGNOSIS — R059 Cough, unspecified: Secondary | ICD-10-CM | POA: Insufficient documentation

## 2011-02-01 DIAGNOSIS — R51 Headache: Secondary | ICD-10-CM | POA: Insufficient documentation

## 2011-02-01 DIAGNOSIS — J069 Acute upper respiratory infection, unspecified: Secondary | ICD-10-CM | POA: Insufficient documentation

## 2011-02-01 DIAGNOSIS — E119 Type 2 diabetes mellitus without complications: Secondary | ICD-10-CM | POA: Insufficient documentation

## 2011-02-01 DIAGNOSIS — I10 Essential (primary) hypertension: Secondary | ICD-10-CM | POA: Insufficient documentation

## 2011-02-01 DIAGNOSIS — E7800 Pure hypercholesterolemia, unspecified: Secondary | ICD-10-CM | POA: Insufficient documentation

## 2011-02-01 LAB — BASIC METABOLIC PANEL
BUN: 27 mg/dL — ABNORMAL HIGH (ref 6–23)
CO2: 27 mEq/L (ref 19–32)
Calcium: 9.3 mg/dL (ref 8.4–10.5)
Chloride: 104 mEq/L (ref 96–112)
Creatinine, Ser: 0.83 mg/dL (ref 0.50–1.10)
GFR calc Af Amer: >60 mL/min (ref >60)
GFR calc non Af Amer: >60 mL/min (ref >60)
Glucose, Bld: 94 mg/dL (ref 70–99)
Potassium: 4.1 mEq/L (ref 3.5–5.1)
Sodium: 140 mEq/L (ref 135–145)

## 2011-02-01 LAB — CBC
HCT: 25.8 % — ABNORMAL LOW (ref 36.0–46.0)
Hemoglobin: 8.4 g/dL — ABNORMAL LOW (ref 12.0–15.0)
MCH: 27 pg (ref 26.0–34.0)
MCHC: 32.6 g/dL (ref 30.0–36.0)
MCV: 83 fL (ref 78.0–100.0)
Platelets: 268 10*3/uL (ref 150–400)
RBC: 3.11 MIL/uL — ABNORMAL LOW (ref 3.87–5.11)
RDW: 14.4 % (ref 11.5–15.5)
WBC: 6.1 10*3/uL (ref 4.0–10.5)

## 2011-02-01 LAB — DIFFERENTIAL
Basophils Absolute: 0 10*3/uL (ref 0.0–0.1)
Basophils Relative: 1 % (ref 0–1)
Eosinophils Absolute: 0.1 10*3/uL (ref 0.0–0.7)
Eosinophils Relative: 2 % (ref 0–5)
Lymphocytes Relative: 33 % (ref 12–46)
Lymphs Abs: 2 10*3/uL (ref 0.7–4.0)
Monocytes Absolute: 0.6 10*3/uL (ref 0.1–1.0)
Monocytes Relative: 9 % (ref 3–12)
Neutro Abs: 3.4 10*3/uL (ref 1.7–7.7)
Neutrophils Relative %: 56 % (ref 43–77)

## 2011-02-01 LAB — PROTIME-INR
INR: 2.97 — ABNORMAL HIGH (ref 0.00–1.49)
Prothrombin Time: 31.4 seconds — ABNORMAL HIGH (ref 11.6–15.2)

## 2011-02-03 ENCOUNTER — Telehealth: Payer: Self-pay

## 2011-02-03 ENCOUNTER — Telehealth: Payer: Self-pay | Admitting: Family Medicine

## 2011-02-03 NOTE — Telephone Encounter (Signed)
Stay on the Vicodin and see me next week if continued difficulty

## 2011-02-03 NOTE — Telephone Encounter (Signed)
PT SAID SHE WAS IN THE HOSPITAL [facility] FOR HEADACHE SAID THE DR THERE GAVE HER VICODIN SHE ASKED IF THERE WAS ANYTHING ELSE SHE COULD TAKE FOR HER HEADACHE

## 2011-02-03 NOTE — Telephone Encounter (Signed)
CALLED PT TO TELL HER TO STAY VICODIN IF HEADACHE CONTINUES TO MAKE APPT TO SEE [facility]

## 2011-02-04 ENCOUNTER — Emergency Department (HOSPITAL_COMMUNITY): Payer: Medicare Other

## 2011-02-04 ENCOUNTER — Emergency Department (HOSPITAL_COMMUNITY)
Admission: EM | Admit: 2011-02-04 | Discharge: 2011-02-04 | Disposition: A | Discharge status: Home / Self Care | Payer: Medicare Other | Attending: Emergency Medicine | Admitting: Emergency Medicine

## 2011-02-04 DIAGNOSIS — M542 Cervicalgia: Secondary | ICD-10-CM | POA: Insufficient documentation

## 2011-02-04 DIAGNOSIS — I10 Essential (primary) hypertension: Secondary | ICD-10-CM | POA: Insufficient documentation

## 2011-02-04 DIAGNOSIS — E7800 Pure hypercholesterolemia, unspecified: Secondary | ICD-10-CM | POA: Insufficient documentation

## 2011-02-04 DIAGNOSIS — E119 Type 2 diabetes mellitus without complications: Secondary | ICD-10-CM | POA: Insufficient documentation

## 2011-02-04 DIAGNOSIS — D649 Anemia, unspecified: Secondary | ICD-10-CM | POA: Insufficient documentation

## 2011-02-04 DIAGNOSIS — Z86711 Personal history of pulmonary embolism: Secondary | ICD-10-CM | POA: Insufficient documentation

## 2011-02-04 DIAGNOSIS — R51 Headache: Secondary | ICD-10-CM | POA: Insufficient documentation

## 2011-02-04 DIAGNOSIS — Z79899 Other long term (current) drug therapy: Secondary | ICD-10-CM | POA: Insufficient documentation

## 2011-02-04 DIAGNOSIS — Z7901 Long term (current) use of anticoagulants: Secondary | ICD-10-CM | POA: Insufficient documentation

## 2011-02-04 LAB — BASIC METABOLIC PANEL
BUN: 26 mg/dL — ABNORMAL HIGH (ref 6–23)
CO2: 28 mEq/L (ref 19–32)
Calcium: 8.8 mg/dL (ref 8.4–10.5)
Chloride: 103 mEq/L (ref 96–112)
Creatinine, Ser: 0.8 mg/dL (ref 0.50–1.10)
GFR calc Af Amer: >60 mL/min (ref >60)
GFR calc non Af Amer: >60 mL/min (ref >60)
Glucose, Bld: 95 mg/dL (ref 70–99)
Potassium: 4.2 mEq/L (ref 3.5–5.1)
Sodium: 138 mEq/L (ref 135–145)

## 2011-02-04 LAB — DIFFERENTIAL
Basophils Absolute: 0 10*3/uL (ref 0.0–0.1)
Basophils Relative: 1 % (ref 0–1)
Eosinophils Absolute: 0.2 10*3/uL (ref 0.0–0.7)
Eosinophils Relative: 3 % (ref 0–5)
Lymphocytes Relative: 31 % (ref 12–46)
Lymphs Abs: 1.5 10*3/uL (ref 0.7–4.0)
Monocytes Absolute: 0.4 10*3/uL (ref 0.1–1.0)
Monocytes Relative: 9 % (ref 3–12)
Neutro Abs: 2.7 10*3/uL (ref 1.7–7.7)
Neutrophils Relative %: 56 % (ref 43–77)

## 2011-02-04 LAB — CBC
HCT: 25.6 % — ABNORMAL LOW (ref 36.0–46.0)
Hemoglobin: 8.4 g/dL — ABNORMAL LOW (ref 12.0–15.0)
MCH: 27.2 pg (ref 26.0–34.0)
MCHC: 32.8 g/dL (ref 30.0–36.0)
MCV: 82.8 fL (ref 78.0–100.0)
Platelets: 286 10*3/uL (ref 150–400)
RBC: 3.09 MIL/uL — ABNORMAL LOW (ref 3.87–5.11)
RDW: 14.4 % (ref 11.5–15.5)
WBC: 4.8 10*3/uL (ref 4.0–10.5)

## 2011-02-04 LAB — PROTIME-INR
INR: 3 — ABNORMAL HIGH (ref 0.00–1.49)
Prothrombin Time: 31.6 seconds — ABNORMAL HIGH (ref 11.6–15.2)

## 2011-02-07 ENCOUNTER — Encounter: Payer: Self-pay | Admitting: Family Medicine

## 2011-02-07 ENCOUNTER — Ambulatory Visit (INDEPENDENT_AMBULATORY_CARE_PROVIDER_SITE_OTHER): Payer: Medicare Other | Admitting: Family Medicine

## 2011-02-07 VITALS — BP 140/70 | HR 68 | Wt 224.0 lb

## 2011-02-07 DIAGNOSIS — R51 Headache: Secondary | ICD-10-CM

## 2011-02-07 DIAGNOSIS — J329 Chronic sinusitis, unspecified: Secondary | ICD-10-CM

## 2011-02-07 MED ORDER — AMOXICILLIN 875 MG PO TABS: 875.0000 mg | ORAL_TABLET | Freq: Two times a day (BID) | ORAL | Status: AC

## 2011-02-07 NOTE — Telephone Encounter (Signed)
DONE

## 2011-02-07 NOTE — Patient Instructions (Signed)
Take all the antibiotic and if you're not totally back to normal, giving a call

## 2011-02-07 NOTE — Progress Notes (Signed)
  Subjective:    Patient ID: Kayla [name], female    DOB: 12/06/1940, 70 y.o.   MRN: [id number]  HPI She is here for consult concerning her headache. She was seen in the emergency room. CT scan MRI was negative. She was given pain meds and states that this time she is not having any pain but is having some sinus congestion it is relieved when she has rhinorrhea or PND.   Review of Systems     Objective:   Physical Exam .....................alert and in no distress. Tympanic membranes and canals are normal. Throat is clear. Tonsils are normal. Neck is supple without adenopathy or thyromegaly. Cardiac exam shows a regular sinus rhythm without murmurs or gallops. Lungs are clear to auscultation. Nasal mucosa is red with tenderness over frontal and maxillary sinuses        Assessment & Plan:  Headache with sinusitis. I will treat her with Amoxil. She is to call me if continued difficulty

## 2011-02-08 ENCOUNTER — Telehealth: Payer: Self-pay | Admitting: Family Medicine

## 2011-02-09 ENCOUNTER — Telehealth: Payer: Self-pay

## 2011-02-09 NOTE — Telephone Encounter (Signed)
Called pt to tell her take antibiotic the headache and med are seprate

## 2011-02-09 NOTE — Telephone Encounter (Signed)
I think a headache and taken the antibiotic were separate. Have her take the antibiotic

## 2011-02-22 ENCOUNTER — Encounter: Payer: Self-pay | Admitting: Podiatry

## 2011-02-27 ENCOUNTER — Other Ambulatory Visit: Payer: Medicare Other

## 2011-02-27 DIAGNOSIS — Z7901 Long term (current) use of anticoagulants: Secondary | ICD-10-CM

## 2011-02-27 LAB — PROTIME-INR
INR: 3.75 — ABNORMAL HIGH (ref <1.50)
Prothrombin Time: 37.4 seconds — ABNORMAL HIGH (ref 11.6–15.2)

## 2011-02-27 NOTE — Progress Notes (Signed)
Addended by: Lavell Islam on: 02/27/2011 09:42 AM   Modules accepted: Orders

## 2011-02-28 ENCOUNTER — Telehealth: Payer: Self-pay

## 2011-02-28 NOTE — Telephone Encounter (Signed)
Pt advised and has appt aug 13

## 2011-03-13 ENCOUNTER — Encounter: Payer: Self-pay | Admitting: Family Medicine

## 2011-03-13 ENCOUNTER — Ambulatory Visit (INDEPENDENT_AMBULATORY_CARE_PROVIDER_SITE_OTHER): Payer: Medicare Other | Admitting: Family Medicine

## 2011-03-13 ENCOUNTER — Telehealth: Payer: Self-pay

## 2011-03-13 VITALS — BP 120/70 | HR 63 | Wt 224.0 lb

## 2011-03-13 DIAGNOSIS — M542 Cervicalgia: Secondary | ICD-10-CM

## 2011-03-13 DIAGNOSIS — R0982 Postnasal drip: Secondary | ICD-10-CM

## 2011-03-13 DIAGNOSIS — Z7901 Long term (current) use of anticoagulants: Secondary | ICD-10-CM

## 2011-03-13 LAB — PROTIME-INR
INR: 2.81 — ABNORMAL HIGH (ref <1.50)
Prothrombin Time: 29.8 seconds — ABNORMAL HIGH (ref 11.6–15.2)

## 2011-03-13 NOTE — Progress Notes (Signed)
  Subjective:    Patient ID: Kayla [name], female    DOB: 07/21/1941, 70 y.o.   MRN: [id number]  HPI She states that her sinus symptoms are doing much better but now she is having intermittent neck pain especially with rotational kind of motion. She also continues to complain of some rhinorrhea as well as PND but no sneezing, itchy watery eyes. She continues on medications listed in the chart including Coumadin  Review of Systems     Objective:   Physical Exam alert and in no distress. Tympanic membranes and canals are normal. Throat is clear. Tonsils are normal. Neck is supple without adenopathy or thyromegaly. Cardiac exam shows a regular sinus rhythm without murmurs or gallops. Lungs are clear to auscultation.        Assessment & Plan:  Postnasal drainage. Chronic Coumadin therapy. History of sinusitis. PT/INR drawn which was therapeutic. She is to continue on this medication. Recommend Claritin for her runny nose and Tylenol for her neck pain.

## 2011-03-13 NOTE — Telephone Encounter (Signed)
Pt informed to continue med and recheck pt/inr in 1 mth

## 2011-03-13 NOTE — Patient Instructions (Signed)
Use Claritin for the drainage and Tylenol for the neck pain

## 2011-04-06 ENCOUNTER — Telehealth: Payer: Self-pay | Admitting: Family Medicine

## 2011-04-06 NOTE — Telephone Encounter (Signed)
Clarification request

## 2011-04-11 ENCOUNTER — Encounter: Payer: Self-pay | Admitting: Family Medicine

## 2011-04-11 ENCOUNTER — Telehealth: Payer: Self-pay

## 2011-04-11 NOTE — Telephone Encounter (Signed)
Pt said [facility] sent pt wrong med but it looks loike med was on back ordered and pt is to take 1/2 of pill qd

## 2011-04-13 ENCOUNTER — Other Ambulatory Visit: Payer: Medicare Other

## 2011-04-17 ENCOUNTER — Other Ambulatory Visit: Payer: Medicare Other

## 2011-04-17 ENCOUNTER — Telehealth: Payer: Self-pay

## 2011-04-17 DIAGNOSIS — Z7901 Long term (current) use of anticoagulants: Secondary | ICD-10-CM

## 2011-04-17 LAB — PROTIME-INR
INR: 3.52 — ABNORMAL HIGH (ref <1.50)
Prothrombin Time: 35.5 seconds — ABNORMAL HIGH (ref 11.6–15.2)

## 2011-04-17 NOTE — Telephone Encounter (Signed)
Left message for pt to recheck in 1 mth

## 2011-04-24 ENCOUNTER — Encounter: Payer: Self-pay | Admitting: Family Medicine

## 2011-04-24 ENCOUNTER — Ambulatory Visit (INDEPENDENT_AMBULATORY_CARE_PROVIDER_SITE_OTHER): Payer: Medicare Other | Admitting: Family Medicine

## 2011-04-24 DIAGNOSIS — R059 Cough, unspecified: Secondary | ICD-10-CM

## 2011-04-24 DIAGNOSIS — R05 Cough: Secondary | ICD-10-CM

## 2011-04-24 DIAGNOSIS — J069 Acute upper respiratory infection, unspecified: Secondary | ICD-10-CM

## 2011-04-24 MED ORDER — BENZONATATE 100 MG PO CAPS: ORAL_CAPSULE | ORAL | Status: DC

## 2011-04-24 NOTE — Patient Instructions (Signed)
Add Claritin. Continue Tussin.  Start Tessalon as needed for cough Return immediately if develops fever, worsening cough, purulence, shortness of breath or other concerns

## 2011-04-24 NOTE — Progress Notes (Signed)
"I gotta a croup".  Described further as a hard, deep cough.  Started having pain in the anterior neck and sides of neck.  Got worse a couple of days later, and then started with a cough.  Coughing up phlegm, initially brown in color, now it is white.  Also having runny nose and eyes.  Nasal mucus is clear.  Denies fevers, sinus pain or headaches.  Denies sore throat.  Voice is hoarse from coughing.  Denies any sick contacts.  Using Tussin every 4 hours, but it doesn't seem to help.    Past Medical History  Diagnosis Date  . Diabetes mellitus   . Hypertension   . Arthritis   . Pulmonary embolus   . Obesity    Current Outpatient Prescriptions on File Prior to Visit  Medication Sig Dispense Refill  . aspirin 325 MG EC tablet Take 325 mg by mouth daily.        [name] atorvastatin (LIPITOR) 80 MG tablet Take 1 tablet (80 mg total) by mouth daily.  90 tablet  2  . Calcium Carbonate (CALTRATE 600 PO) Take by mouth.        . carvedilol (COREG) 3.125 MG tablet Take 1 tablet (3.125 mg total) by mouth 2 (two) times daily.  180 tablet  4  . hydrochlorothiazide (,MICROZIDE/HYDRODIURIL,) 12.5 MG capsule Take 12.5 mg by mouth daily.        . iron polysaccharides (NIFEREX) 150 MG capsule Take 150 mg by mouth 2 (two) times daily.        [name] losartan (COZAAR) 25 MG tablet Take 1 tablet (25 mg total) by mouth daily.  90 tablet  4  . metFORMIN (GLUCOPHAGE) 500 MG tablet Take 1 tablet (500 mg total) by mouth 2 (two) times daily with a meal.  180 tablet  1  . Multiple Vitamins-Minerals (MULTIVITAMIN WITH MINERALS) tablet Take 1 tablet by mouth daily.        . reserpine 0.1 MG TABS Take 1 tablet (0.1 mg total) by mouth daily.  90 tablet  4  . vitamin B-12 (CYANOCOBALAMIN) 1000 MCG tablet Take 1,000 mcg by mouth daily.        [name] warfarin (COUMADIN) 10 MG tablet Take 1 tablet (10 mg total) by mouth daily.  90 tablet  0  . warfarin (COUMADIN) 2 MG tablet Take 1 tablet (2 mg total) by mouth daily.  90 tablet  0  . carvedilol  (COREG) 12.5 MG tablet Take 12.5 mg by mouth 2 (two) times daily with a meal.         No Known Allergies  ROS:  Denies chest pain, but sometimes feels tight and congested.  Denies myalgias.  Denies fevers, nausea, vomiting, diarrhea.  Denies any bleeding--rarely with wiping, especially if having frequent stools.  H/o internal hemorrhoid  PHYSICAL EXAM: BP 120/64  Pulse 64  Temp(Src) 98.4 F (36.9 C) (Oral)  Ht 5\' 4"  (1.626 m)  Wt 228 lb (103.42 kg)  BMI 39.14 kg/m2 Well developed, pleasant female with slightly hoarse voice.  Speaking comfortably, in no distress HEENT: PERRL, conjunctiva clear; nasal mucosa--mild edematous, no erythema or purulence.  Sinuses nontender.  OP clear Neck: no lymphadenopathy or mass Heart: regular rate and rhythm without murmur Lungs: clear bilaterally without wheezes, rales or ronchi, even with forced expiration Skin: no rash  ASSESSENT/PLAN: 1. Cough  benzonatate (TESSALON PERLES) 100 MG capsule  2. URI (upper respiratory infection)     Add Claritin. Continue Tussin.  Start Tessalon as needed  for cough Return immediately if develops fever, worsening cough, purulence, shortness of breath or other concerns

## 2011-04-28 ENCOUNTER — Telehealth: Payer: Self-pay | Admitting: Family Medicine

## 2011-05-01 ENCOUNTER — Telehealth: Payer: Self-pay | Admitting: *Deleted

## 2011-05-01 ENCOUNTER — Encounter: Payer: Self-pay | Admitting: *Deleted

## 2011-05-01 NOTE — Telephone Encounter (Signed)
Spoke with patient, she verified that she is taking Coreg(carvedilol 3.125mg ), not 12.5.

## 2011-05-15 ENCOUNTER — Other Ambulatory Visit: Payer: Medicare Other

## 2011-05-15 DIAGNOSIS — Z7901 Long term (current) use of anticoagulants: Secondary | ICD-10-CM

## 2011-05-15 LAB — PROTIME-INR
INR: 2.81 — ABNORMAL HIGH (ref <1.50)
Prothrombin Time: 29.8 seconds — ABNORMAL HIGH (ref 11.6–15.2)

## 2011-05-17 NOTE — Telephone Encounter (Signed)
dt ?

## 2011-05-19 LAB — HM MAMMOGRAPHY

## 2011-05-22 ENCOUNTER — Ambulatory Visit (INDEPENDENT_AMBULATORY_CARE_PROVIDER_SITE_OTHER): Payer: Medicare Other | Admitting: Family Medicine

## 2011-05-22 ENCOUNTER — Encounter: Payer: Self-pay | Admitting: Family Medicine

## 2011-05-22 DIAGNOSIS — K922 Gastrointestinal hemorrhage, unspecified: Secondary | ICD-10-CM

## 2011-05-22 DIAGNOSIS — I10 Essential (primary) hypertension: Secondary | ICD-10-CM | POA: Insufficient documentation

## 2011-05-22 DIAGNOSIS — R5381 Other malaise: Secondary | ICD-10-CM

## 2011-05-22 DIAGNOSIS — D649 Anemia, unspecified: Secondary | ICD-10-CM

## 2011-05-22 DIAGNOSIS — I152 Hypertension secondary to endocrine disorders: Secondary | ICD-10-CM | POA: Insufficient documentation

## 2011-05-22 DIAGNOSIS — R5383 Other fatigue: Secondary | ICD-10-CM

## 2011-05-22 DIAGNOSIS — E1159 Type 2 diabetes mellitus with other circulatory complications: Secondary | ICD-10-CM

## 2011-05-22 DIAGNOSIS — Z79899 Other long term (current) drug therapy: Secondary | ICD-10-CM

## 2011-05-22 DIAGNOSIS — E119 Type 2 diabetes mellitus without complications: Secondary | ICD-10-CM

## 2011-05-22 DIAGNOSIS — E1169 Type 2 diabetes mellitus with other specified complication: Secondary | ICD-10-CM | POA: Insufficient documentation

## 2011-05-22 DIAGNOSIS — E785 Hyperlipidemia, unspecified: Secondary | ICD-10-CM

## 2011-05-22 NOTE — Patient Instructions (Signed)
We will call you with the results of your blood work. We will also set her up to see Dr. Elnoria Howard

## 2011-05-22 NOTE — Progress Notes (Signed)
  Subjective:    Patient ID: Kayla [name], female    DOB: 05/15/1941, 70 y.o.   MRN: [id number]  HPI She is here for a diabetes recheck. She continues on her iron pills but is only taking one per day. She has a previous history of iron deficiency anemia. She does complain of some fatigue. She's also been having continued difficulty with rectal bleeding and apparently was told she had internal hemorrhoids. She continues to check her blood sugars but mainly in the morning. She does have an eye exam set up in the near future. She had a recent mammogram. She does occasionally check her feet. Her exercise is quite minimal. She does not smoke or drink.   Review of Systems     Objective:   Physical Exam Rectal exam shows no lesions however the stool was guaiac-positive. No internal lesions were palpable. Foot exam shows difficulty obtaining pulses with decreased reflexes but normal skin.       Assessment & Plan:   1. Diabetes mellitus    2. Hypertension associated with diabetes    3. Anemia  Ambulatory referral to Gastroenterology, CBC with Differential  4. Hyperlipidemia LDL goal <70  Lipid panel  5. Fatigue  TSH  6. Encounter for long-term (current) use of other medications  CBC with Differential, Comprehensive metabolic panel, Lipid panel  7. GI bleed     encouraged her to continue take good care of herself. I will send her to GI for further evaluation of her GI bleeding. Routine blood screening. Recheck here in several months.

## 2011-05-23 LAB — COMPREHENSIVE METABOLIC PANEL
ALT: 20 U/L (ref 0–35)
AST: 23 U/L (ref 0–37)
Albumin: 3.6 g/dL (ref 3.5–5.2)
Alkaline Phosphatase: 74 U/L (ref 39–117)
BUN: 24 mg/dL — ABNORMAL HIGH (ref 6–23)
CO2: 26 mEq/L (ref 19–32)
Calcium: 8.5 mg/dL (ref 8.4–10.5)
Chloride: 106 mEq/L (ref 96–112)
Creat: 0.77 mg/dL (ref 0.50–1.10)
Glucose, Bld: 89 mg/dL (ref 70–99)
Potassium: 4.1 mEq/L (ref 3.5–5.3)
Sodium: 139 mEq/L (ref 135–145)
Total Bilirubin: 0.2 mg/dL — ABNORMAL LOW (ref 0.3–1.2)
Total Protein: 6.5 g/dL (ref 6.0–8.3)

## 2011-05-23 LAB — CBC WITH DIFFERENTIAL/PLATELET
Basophils Absolute: 0 10*3/uL (ref 0.0–0.1)
Basophils Relative: 1 % (ref 0–1)
Eosinophils Absolute: 0.2 10*3/uL (ref 0.0–0.7)
Eosinophils Relative: 3 % (ref 0–5)
HCT: 25.8 % — ABNORMAL LOW (ref 36.0–46.0)
Hemoglobin: 7.9 g/dL — ABNORMAL LOW (ref 12.0–15.0)
Lymphocytes Relative: 33 % (ref 12–46)
Lymphs Abs: 1.6 10*3/uL (ref 0.7–4.0)
MCH: 24.2 pg — ABNORMAL LOW (ref 26.0–34.0)
MCHC: 30.6 g/dL (ref 30.0–36.0)
MCV: 78.9 fL (ref 78.0–100.0)
Monocytes Absolute: 0.4 10*3/uL (ref 0.1–1.0)
Monocytes Relative: 9 % (ref 3–12)
Neutro Abs: 2.6 10*3/uL (ref 1.7–7.7)
Neutrophils Relative %: 54 % (ref 43–77)
Platelets: 308 10*3/uL (ref 150–400)
RBC: 3.27 MIL/uL — ABNORMAL LOW (ref 3.87–5.11)
RDW: 15.2 % (ref 11.5–15.5)
WBC: 4.9 10*3/uL (ref 4.0–10.5)

## 2011-05-23 LAB — LIPID PANEL
Cholesterol: 159 mg/dL (ref 0–200)
HDL: 53 mg/dL (ref >39)
LDL Cholesterol: 97 mg/dL (ref 0–99)
Total CHOL/HDL Ratio: 3 Ratio
Triglycerides: 43 mg/dL (ref <150)
VLDL: 9 mg/dL (ref 0–40)

## 2011-05-23 LAB — TSH: TSH: 1.483 u[IU]/mL (ref 0.350–4.500)

## 2011-05-26 ENCOUNTER — Telehealth: Payer: Self-pay | Admitting: Family Medicine

## 2011-05-26 NOTE — Telephone Encounter (Signed)
Kayla [name] notified patient of results cause patient called in to see what the doctor had said.

## 2011-05-26 NOTE — Telephone Encounter (Signed)
Patient states that she has a sore throat and would like some medications to help, but she does not want to come in for a OV. CLS

## 2011-05-26 NOTE — Telephone Encounter (Signed)
Have her gargle with Chloraseptic or Cepastat orienting works the best for her. An antibiotic would not be appropriate and less I could see her to make sure we are treating

## 2011-05-29 ENCOUNTER — Ambulatory Visit (INDEPENDENT_AMBULATORY_CARE_PROVIDER_SITE_OTHER): Payer: Medicare Other | Admitting: Family Medicine

## 2011-05-29 ENCOUNTER — Encounter: Payer: Self-pay | Admitting: Family Medicine

## 2011-05-29 VITALS — BP 130/80 | HR 75 | Wt 230.0 lb

## 2011-05-29 DIAGNOSIS — Z7901 Long term (current) use of anticoagulants: Secondary | ICD-10-CM

## 2011-05-29 DIAGNOSIS — J069 Acute upper respiratory infection, unspecified: Secondary | ICD-10-CM

## 2011-05-29 NOTE — Progress Notes (Signed)
  Subjective:    Patient ID: Kayla [name], female    DOB: [birth year], 70 y.o.   MRN: [id number]  HPI She is here for consultation. She has had some difficulty recently with nasal congestion, rhinorrhea and slight cough. No fever or chills. She also is concerned about her pulmonary embolus and continuing on the Coumadin. Recently she did have difficulty with rectal bleeding and colonoscopy was recommended. She is reluctant to stop her medications. I did review her hospital record. The evaluation for the pulmonary embolus in terms of finding an etiology was essentially negative.   Review of Systems     Objective:   Physical Exam alert and in no distress. Tympanic membranes and canals are normal. Throat is clear. Tonsils are normal. Neck is supple without adenopathy or thyromegaly. Cardiac exam shows a regular sinus rhythm without murmurs or gallops. Lungs are clear to auscultation.        Assessment & Plan:   1. Encounter for long-term (current) use of anticoagulants   2. Acute URI    supportive care for the URI. I will discuss possibly stopping the Coumadin with Dr. Herbie Baltimore and then let her know what our findings are.

## 2011-06-02 ENCOUNTER — Encounter: Payer: Self-pay | Admitting: Family Medicine

## 2011-06-02 NOTE — Progress Notes (Signed)
  Subjective:    Patient ID: Kayla [name], female    DOB: 03/28/1941, 70 y.o.   MRN: [id number]  HPI    Review of Systems     Objective:   Physical Exam        Assessment & Plan:   I discussed the therapy and workup for her PE with Dr. Herbie Baltimore. He and I both agree that she is now at 6 months, she can stop the Coumadin. We discussed the possibility of doing a hypercoagulable workup but will defer this. I relayed this information to her. Encouraged her to call Dr. Loreta Ave and set up a colonoscopy whenever it's good for her schedule.

## 2011-06-20 ENCOUNTER — Encounter: Payer: Self-pay | Admitting: Family Medicine

## 2011-06-20 ENCOUNTER — Ambulatory Visit (INDEPENDENT_AMBULATORY_CARE_PROVIDER_SITE_OTHER): Payer: Medicare Other | Admitting: Family Medicine

## 2011-06-20 VITALS — BP 124/80 | HR 60 | Wt 229.0 lb

## 2011-06-20 DIAGNOSIS — L309 Dermatitis, unspecified: Secondary | ICD-10-CM

## 2011-06-20 DIAGNOSIS — Z7901 Long term (current) use of anticoagulants: Secondary | ICD-10-CM

## 2011-06-20 DIAGNOSIS — L259 Unspecified contact dermatitis, unspecified cause: Secondary | ICD-10-CM

## 2011-06-20 NOTE — Patient Instructions (Signed)
Use cold compresses on that area and then try Claritin or Allegra. Let me know if that works and if it does not we'll try something different

## 2011-06-20 NOTE — Progress Notes (Signed)
  Subjective:    Patient ID: Kayla [name], female    DOB: Mar 08, 1941, 70 y.o.   MRN: [id number]  HPI She is here for consultation concerning an itching sensation that she has on her abdomen. His had a history of shingles but states that the itching in the area slightly different from the shingles. She would also like a repeat of her PT/INR. She will be on the Coumadin for approximately 6 months and plans to stop the medication at the end of the month.   Review of Systems     Objective:   Physical Exam Alert and in no distress. Exam of her abdomen shows no obvious lesions.       Assessment & Plan:   1. Encounter for long-term (current) use of anticoagulants   dermatitis Recommend she use cool compresses on the areas itch as well as using antihistamine. If continued difficulty, she is to call me

## 2011-06-21 LAB — PROTIME-INR
INR: 2.34 — ABNORMAL HIGH (ref <1.50)
Prothrombin Time: 26.4 seconds — ABNORMAL HIGH (ref 11.6–15.2)

## 2011-06-26 ENCOUNTER — Telehealth: Payer: Self-pay | Admitting: Family Medicine

## 2011-06-26 MED ORDER — METFORMIN HCL 500 MG PO TABS: 500.0000 mg | ORAL_TABLET | Freq: Two times a day (BID) | ORAL | Status: DC

## 2011-06-26 NOTE — Telephone Encounter (Signed)
Refilled pt med for them

## 2011-07-27 ENCOUNTER — Ambulatory Visit (INDEPENDENT_AMBULATORY_CARE_PROVIDER_SITE_OTHER): Payer: Medicare Other | Admitting: Family Medicine

## 2011-07-27 ENCOUNTER — Encounter: Payer: Self-pay | Admitting: Family Medicine

## 2011-07-27 VITALS — BP 124/70 | HR 75 | Wt 235.0 lb

## 2011-07-27 DIAGNOSIS — J209 Acute bronchitis, unspecified: Secondary | ICD-10-CM

## 2011-07-27 DIAGNOSIS — Z23 Encounter for immunization: Secondary | ICD-10-CM

## 2011-07-27 DIAGNOSIS — M79601 Pain in right arm: Secondary | ICD-10-CM

## 2011-07-27 DIAGNOSIS — M79609 Pain in unspecified limb: Secondary | ICD-10-CM

## 2011-07-27 MED ORDER — AMOXICILLIN 875 MG PO TABS: 875.0000 mg | ORAL_TABLET | Freq: Two times a day (BID) | ORAL | Status: AC

## 2011-07-27 NOTE — Progress Notes (Signed)
  Subjective:    Patient ID: Kayla [name], female    DOB: 04/13/1941, 70 y.o.   MRN: [id number]  HPI She complains of a two-week history of coughing up thick clear material but no fever, chills, sore throat or earache. She has no underlying allergies and does not smoke. She also has a intermittent history of right neck and arm pain. There's been no numbness tingling or weakness. She cannot relate this to any particular activities or head position.   Review of Systems     Objective:   Physical Exam alert and in no distress. Tympanic membranes and canals are normal. Throat is clear. Tonsils are normal. Neck is supple without adenopathy or thyromegaly. Cardiac exam shows a regular sinus rhythm without murmurs or gallops. Lungs are clear to auscultation. Good motion of her neck. Normal motor, sensory and DTRs of her arms.       Assessment & Plan:   1. Bronchitis, acute   2. Right arm pain    I will treat with Amoxil for the bronchitis. She is to treat the arm and neck pain symptomatically. If the symptoms worsen, she is to call me.

## 2011-07-27 NOTE — Patient Instructions (Signed)
Take all of the antibiotic and if not totally back to normal, give me a call. If you have worsening neck and arm pain, set up another appointment

## 2011-08-07 ENCOUNTER — Telehealth: Payer: Self-pay

## 2011-08-07 NOTE — Telephone Encounter (Signed)
She can cut out meat but does need to get protein and some other form like through eating nuts

## 2011-08-07 NOTE — Telephone Encounter (Signed)
Pt called wanted me to ask you if it would be ok if she didn't eat meat for 21 days I think it is some kind of fasting that she wants to do please advise

## 2011-08-07 NOTE — Telephone Encounter (Signed)
Pt informed

## 2011-08-17 ENCOUNTER — Ambulatory Visit (INDEPENDENT_AMBULATORY_CARE_PROVIDER_SITE_OTHER): Payer: Medicare Other | Admitting: Family Medicine

## 2011-08-17 VITALS — BP 140/70 | HR 92 | Temp 101.1°F | Wt 228.0 lb

## 2011-08-17 DIAGNOSIS — R509 Fever, unspecified: Secondary | ICD-10-CM

## 2011-08-17 DIAGNOSIS — J101 Influenza due to other identified influenza virus with other respiratory manifestations: Secondary | ICD-10-CM

## 2011-08-17 DIAGNOSIS — J111 Influenza due to unidentified influenza virus with other respiratory manifestations: Secondary | ICD-10-CM

## 2011-08-17 LAB — POCT INFLUENZA A/B
Influenza A, POC: POSITIVE
Influenza B, POC: NEGATIVE

## 2011-08-17 MED ORDER — OSELTAMIVIR PHOSPHATE 75 MG PO CAPS: 75.0000 mg | ORAL_CAPSULE | Freq: Two times a day (BID) | ORAL | Status: AC

## 2011-08-17 NOTE — Progress Notes (Signed)
  Subjective:    Patient ID: Kayla [name], female    DOB: June 20, 1941, 71 y.o.   MRN: [id number]  HPI Yesterday she started with difficulty with runny nose,PND followed by weakness. This morning she noted chest congestion and coughing as well as fever.   Review of Systems     Objective:   Physical Exam alert and in no distress. Tympanic membranes and canals are normal. Throat is clear. Tonsils are normal. Neck is supple without adenopathy or thyromegaly. Cardiac exam shows a regular sinus rhythm without murmurs or gallops. Lungs are clear to auscultation. Influenza screen was positive      Assessment & Plan:   1. Fever chills  POCT Influenza A/B  2. Influenza B     will place her on Tamiflu. Discussed the dosing and possible side effects. She is to call if she has worsening cough, congestion or shortness of breath

## 2011-08-17 NOTE — Patient Instructions (Signed)
Take the Tamiflu twice a day. You can also use Tylenol for the fever aches and pains. If your cough and congestion get worse, call me. If you have problems with the Tamiflu call me

## 2011-09-06 ENCOUNTER — Other Ambulatory Visit: Payer: Self-pay | Admitting: Family Medicine

## 2011-09-06 NOTE — Telephone Encounter (Signed)
Patient needs to schedule an appointment to receive anymore refills after this.Thank you

## 2011-09-12 ENCOUNTER — Telehealth: Payer: Self-pay | Admitting: Family Medicine

## 2011-09-12 NOTE — Telephone Encounter (Signed)
This has been taken care of she has appt tomorrow

## 2011-09-13 ENCOUNTER — Ambulatory Visit (INDEPENDENT_AMBULATORY_CARE_PROVIDER_SITE_OTHER): Payer: Medicare Other | Admitting: Family Medicine

## 2011-09-13 ENCOUNTER — Encounter: Payer: Self-pay | Admitting: Family Medicine

## 2011-09-13 DIAGNOSIS — E1159 Type 2 diabetes mellitus with other circulatory complications: Secondary | ICD-10-CM

## 2011-09-13 DIAGNOSIS — E669 Obesity, unspecified: Secondary | ICD-10-CM

## 2011-09-13 DIAGNOSIS — E119 Type 2 diabetes mellitus without complications: Secondary | ICD-10-CM

## 2011-09-13 DIAGNOSIS — I152 Hypertension secondary to endocrine disorders: Secondary | ICD-10-CM

## 2011-09-13 DIAGNOSIS — E1169 Type 2 diabetes mellitus with other specified complication: Secondary | ICD-10-CM

## 2011-09-13 DIAGNOSIS — D649 Anemia, unspecified: Secondary | ICD-10-CM

## 2011-09-13 DIAGNOSIS — I10 Essential (primary) hypertension: Secondary | ICD-10-CM

## 2011-09-13 NOTE — Progress Notes (Signed)
  Subjective:    Patient ID: Kayla [name], female    DOB: 1941/02/19, 71 y.o.   MRN: [id number]  HPI She is here for a recheck. Recently she was on a new diet with other church members which was basically a vegetarian. She did lose approximately 10 pounds. She also states that she felt much better and had less aches and pains while she is on a diet. She has since resumed her regular eating habits. She continues on medications listed in the chart. Review of the record indicates a low hemoglobin. She was supposed to increase her iron intake. She does periodically check her blood sugars. She does not smoke or drink.   Review of Systems     Objective:   Physical Exam Alert and in no distress otherwise not examined. Hemoglobin A1c is 6.1.       Assessment & Plan:   1. Diabetes mellitus  POCT HgB A1C  2. Anemia  CBC with Differential  3. Hypertension associated with diabetes    4. Obesity     encourage her to start back on her vegetarian diet since it's helping make her feel better and have less aches and pains as well as with weight reduction. Suggested that she could possibly get off some of her medications if she continues to lose weight.

## 2011-09-13 NOTE — Patient Instructions (Signed)
I will call you with your blood results tomorrow. Go back to the vegetarian diet and makes her eat protein

## 2011-09-14 LAB — CBC WITH DIFFERENTIAL/PLATELET
Basophils Absolute: 0 10*3/uL (ref 0.0–0.1)
Basophils Relative: 1 % (ref 0–1)
Eosinophils Absolute: 0.2 10*3/uL (ref 0.0–0.7)
Eosinophils Relative: 4 % (ref 0–5)
HCT: 28.4 % — ABNORMAL LOW (ref 36.0–46.0)
Hemoglobin: 8.4 g/dL — ABNORMAL LOW (ref 12.0–15.0)
Lymphocytes Relative: 42 % (ref 12–46)
Lymphs Abs: 1.7 10*3/uL (ref 0.7–4.0)
MCH: 22.3 pg — ABNORMAL LOW (ref 26.0–34.0)
MCHC: 29.6 g/dL — ABNORMAL LOW (ref 30.0–36.0)
MCV: 75.5 fL — ABNORMAL LOW (ref 78.0–100.0)
Monocytes Absolute: 0.5 10*3/uL (ref 0.1–1.0)
Monocytes Relative: 12 % (ref 3–12)
Neutro Abs: 1.7 10*3/uL (ref 1.7–7.7)
Neutrophils Relative %: 41 % — ABNORMAL LOW (ref 43–77)
Platelets: 279 10*3/uL (ref 150–400)
RBC: 3.76 MIL/uL — ABNORMAL LOW (ref 3.87–5.11)
RDW: 17.3 % — ABNORMAL HIGH (ref 11.5–15.5)
WBC: 4.1 10*3/uL (ref 4.0–10.5)

## 2011-09-20 ENCOUNTER — Other Ambulatory Visit: Payer: Self-pay | Admitting: Family Medicine

## 2011-09-20 NOTE — Telephone Encounter (Signed)
Is this okay?

## 2011-10-10 ENCOUNTER — Telehealth: Payer: Self-pay | Admitting: Family Medicine

## 2011-10-10 NOTE — Telephone Encounter (Signed)
Have her set up an appointment for possible switch to a different medication

## 2011-10-10 NOTE — Telephone Encounter (Signed)
Pt has appt

## 2011-10-18 ENCOUNTER — Ambulatory Visit (INDEPENDENT_AMBULATORY_CARE_PROVIDER_SITE_OTHER): Payer: Medicare Other | Admitting: Family Medicine

## 2011-10-18 ENCOUNTER — Encounter: Payer: Self-pay | Admitting: Family Medicine

## 2011-10-18 VITALS — BP 120/82 | HR 75 | Wt 228.0 lb

## 2011-10-18 DIAGNOSIS — E1159 Type 2 diabetes mellitus with other circulatory complications: Secondary | ICD-10-CM

## 2011-10-18 DIAGNOSIS — E1169 Type 2 diabetes mellitus with other specified complication: Secondary | ICD-10-CM

## 2011-10-18 DIAGNOSIS — D649 Anemia, unspecified: Secondary | ICD-10-CM

## 2011-10-18 DIAGNOSIS — I10 Essential (primary) hypertension: Secondary | ICD-10-CM

## 2011-10-18 DIAGNOSIS — I152 Hypertension secondary to endocrine disorders: Secondary | ICD-10-CM

## 2011-10-18 LAB — CBC WITH DIFFERENTIAL/PLATELET
Basophils Absolute: 0 10*3/uL (ref 0.0–0.1)
Basophils Relative: 1 % (ref 0–1)
Eosinophils Absolute: 0.2 10*3/uL (ref 0.0–0.7)
Eosinophils Relative: 4 % (ref 0–5)
HCT: 32.3 % — ABNORMAL LOW (ref 36.0–46.0)
Hemoglobin: 9.8 g/dL — ABNORMAL LOW (ref 12.0–15.0)
Lymphocytes Relative: 35 % (ref 12–46)
Lymphs Abs: 1.6 10*3/uL (ref 0.7–4.0)
MCH: 23.3 pg — ABNORMAL LOW (ref 26.0–34.0)
MCHC: 30.3 g/dL (ref 30.0–36.0)
MCV: 76.9 fL — ABNORMAL LOW (ref 78.0–100.0)
Monocytes Absolute: 0.4 10*3/uL (ref 0.1–1.0)
Monocytes Relative: 8 % (ref 3–12)
Neutro Abs: 2.4 10*3/uL (ref 1.7–7.7)
Neutrophils Relative %: 53 % (ref 43–77)
Platelets: 276 10*3/uL (ref 150–400)
RBC: 4.2 MIL/uL (ref 3.87–5.11)
RDW: 19.1 % — ABNORMAL HIGH (ref 11.5–15.5)
WBC: 4.6 10*3/uL (ref 4.0–10.5)

## 2011-10-18 NOTE — Progress Notes (Signed)
  Subjective:    Patient ID: Kayla [name], female    DOB: 1940-11-29, 71 y.o.   MRN: [id number]  HPI She is here for recheck. Apparently serpentine is no longer available. She would like to switch to a different medication. Review of the record indicates she has lost a few pounds. Her blood pressure today is also quite good. She also has a history of anemia and is on iron replacement. Review of Systems     Objective:   Physical Exam Alert and in no distress otherwise not examined       Assessment & Plan:   1. Hypertension associated with diabetes    2. Anemia  CBC with Differential   will monitor her blood pressure to see if she indeed does need to be placed on a new medication. Also discussed combining some of her medications to light and the pillow. When she runs out of her blood pressure medication she will call me.

## 2011-10-18 NOTE — Patient Instructions (Signed)
Stay on your present medications and I will check you again in several months.

## 2011-11-27 ENCOUNTER — Encounter: Payer: Self-pay | Admitting: Cardiology

## 2011-12-12 ENCOUNTER — Other Ambulatory Visit: Payer: Self-pay | Admitting: Family Medicine

## 2012-01-01 ENCOUNTER — Telehealth: Payer: Self-pay | Admitting: Family Medicine

## 2012-01-01 MED ORDER — GLUCOSE BLOOD VI STRP: ORAL_STRIP | Status: DC

## 2012-01-01 NOTE — Telephone Encounter (Signed)
SENT TEST STRIPS TO [facility] PER PT

## 2012-01-09 ENCOUNTER — Encounter: Payer: Self-pay | Admitting: Family Medicine

## 2012-01-09 ENCOUNTER — Ambulatory Visit (INDEPENDENT_AMBULATORY_CARE_PROVIDER_SITE_OTHER): Payer: Medicare Other | Admitting: Family Medicine

## 2012-01-09 VITALS — BP 120/70 | HR 87 | Wt 230.0 lb

## 2012-01-09 DIAGNOSIS — I152 Hypertension secondary to endocrine disorders: Secondary | ICD-10-CM

## 2012-01-09 DIAGNOSIS — D649 Anemia, unspecified: Secondary | ICD-10-CM

## 2012-01-09 DIAGNOSIS — E119 Type 2 diabetes mellitus without complications: Secondary | ICD-10-CM

## 2012-01-09 DIAGNOSIS — E1169 Type 2 diabetes mellitus with other specified complication: Secondary | ICD-10-CM

## 2012-01-09 DIAGNOSIS — E1159 Type 2 diabetes mellitus with other circulatory complications: Secondary | ICD-10-CM

## 2012-01-09 DIAGNOSIS — E785 Hyperlipidemia, unspecified: Secondary | ICD-10-CM

## 2012-01-09 DIAGNOSIS — I10 Essential (primary) hypertension: Secondary | ICD-10-CM

## 2012-01-09 LAB — CBC WITH DIFFERENTIAL/PLATELET
Basophils Absolute: 0 10*3/uL (ref 0.0–0.1)
Basophils Relative: 1 % (ref 0–1)
Eosinophils Absolute: 0.1 10*3/uL (ref 0.0–0.7)
Eosinophils Relative: 3 % (ref 0–5)
HCT: 33.1 % — ABNORMAL LOW (ref 36.0–46.0)
Hemoglobin: 10.5 g/dL — ABNORMAL LOW (ref 12.0–15.0)
Lymphocytes Relative: 38 % (ref 12–46)
Lymphs Abs: 1.5 10*3/uL (ref 0.7–4.0)
MCH: 26.2 pg (ref 26.0–34.0)
MCHC: 31.7 g/dL (ref 30.0–36.0)
MCV: 82.5 fL (ref 78.0–100.0)
Monocytes Absolute: 0.3 10*3/uL (ref 0.1–1.0)
Monocytes Relative: 8 % (ref 3–12)
Neutro Abs: 1.9 10*3/uL (ref 1.7–7.7)
Neutrophils Relative %: 50 % (ref 43–77)
Platelets: 267 10*3/uL (ref 150–400)
RBC: 4.01 MIL/uL (ref 3.87–5.11)
RDW: 16.5 % — ABNORMAL HIGH (ref 11.5–15.5)
WBC: 3.9 10*3/uL — ABNORMAL LOW (ref 4.0–10.5)

## 2012-01-09 LAB — POCT GLYCOSYLATED HEMOGLOBIN (HGB A1C): Hemoglobin A1C: 6.5

## 2012-01-09 MED ORDER — CARVEDILOL 3.125 MG PO TABS: 3.1250 mg | ORAL_TABLET | Freq: Two times a day (BID) | ORAL | Status: DC

## 2012-01-09 MED ORDER — LOSARTAN POTASSIUM-HCTZ 50-12.5 MG PO TABS: 1.0000 | ORAL_TABLET | Freq: Every day | ORAL | Status: DC

## 2012-01-09 NOTE — Patient Instructions (Signed)
Use Claritin and see what that'll do to help with the fluid in the back of the throat

## 2012-01-09 NOTE — Progress Notes (Signed)
  Subjective:    Patient ID: Kayla [name], female    DOB: [birth year], 71 y.o.   MRN: [id number]  HPI She is here for recheck. She has had difficulty with dizziness in the last day or so but no fever, chills, blurred or double vision, weakness numbness or tingling. She continues on her medications listed in the chart. She does be several of the blood pressure meds renewed. She also has a history of anemia and has been on iron for some time now. She also needs a form filled out so she can continue to use [facility]. She also complains of fluid in the back of the throat but no cough, fever, chills, sneezing.   Review of Systems     Objective:   Physical Exam alert and in no distress. Tympanic membranes and canals are normal. Throat is clear. Tonsils are normal. Neck is supple without adenopathy or thyromegaly. Cardiac exam shows a regular sinus rhythm without murmurs or gallops. Lungs are clear to auscultation. Hemoglobin A1c is 6.5.      Assessment & Plan:   1. Type II or unspecified type diabetes mellitus without mention of complication, not stated as uncontrolled  POCT HgB A1C  2. Diabetes mellitus    3. Hyperlipidemia LDL goal <70    4. Hypertension associated with diabetes  carvedilol (COREG) 3.125 MG tablet, losartan-hydrochlorothiazide (HYZAAR) 50-12.5 MG per tablet  5. Anemia  CBC with Differential   I explained that I did not find a reason behind her dizziness and recommend she followup with me if this continues. I did renew her Coreg and switch her to Hyzaar to help reduce her pill load. Recommend she try Claritin to see if this will help with the fluid in her throat.

## 2012-01-10 NOTE — Progress Notes (Signed)
Advised pt of labs, she would like to know what is making her white blood count be low.  Please advise pt

## 2012-01-12 ENCOUNTER — Ambulatory Visit: Payer: Medicare Other | Admitting: Family Medicine

## 2012-02-29 ENCOUNTER — Other Ambulatory Visit: Payer: Self-pay | Admitting: Family Medicine

## 2012-05-09 ENCOUNTER — Ambulatory Visit (INDEPENDENT_AMBULATORY_CARE_PROVIDER_SITE_OTHER): Payer: Medicare Other | Admitting: Family Medicine

## 2012-05-09 ENCOUNTER — Encounter: Payer: Self-pay | Admitting: Family Medicine

## 2012-05-09 VITALS — BP 120/72 | HR 78 | Wt 233.0 lb

## 2012-05-09 DIAGNOSIS — E1159 Type 2 diabetes mellitus with other circulatory complications: Secondary | ICD-10-CM

## 2012-05-09 DIAGNOSIS — I10 Essential (primary) hypertension: Secondary | ICD-10-CM

## 2012-05-09 DIAGNOSIS — I152 Hypertension secondary to endocrine disorders: Secondary | ICD-10-CM

## 2012-05-09 DIAGNOSIS — E1169 Type 2 diabetes mellitus with other specified complication: Secondary | ICD-10-CM

## 2012-05-09 DIAGNOSIS — E119 Type 2 diabetes mellitus without complications: Secondary | ICD-10-CM

## 2012-05-09 DIAGNOSIS — D649 Anemia, unspecified: Secondary | ICD-10-CM

## 2012-05-09 DIAGNOSIS — E785 Hyperlipidemia, unspecified: Secondary | ICD-10-CM

## 2012-05-09 DIAGNOSIS — E669 Obesity, unspecified: Secondary | ICD-10-CM

## 2012-05-09 LAB — POCT GLYCOSYLATED HEMOGLOBIN (HGB A1C): Hemoglobin A1C: 6.6

## 2012-05-09 LAB — CBC WITH DIFFERENTIAL/PLATELET
Basophils Absolute: 0 10*3/uL (ref 0.0–0.1)
Basophils Relative: 1 % (ref 0–1)
Eosinophils Absolute: 0.1 10*3/uL (ref 0.0–0.7)
Eosinophils Relative: 3 % (ref 0–5)
HCT: 33.3 % — ABNORMAL LOW (ref 36.0–46.0)
Hemoglobin: 11.1 g/dL — ABNORMAL LOW (ref 12.0–15.0)
Lymphocytes Relative: 36 % (ref 12–46)
Lymphs Abs: 1.4 10*3/uL (ref 0.7–4.0)
MCH: 26.9 pg (ref 26.0–34.0)
MCHC: 33.3 g/dL (ref 30.0–36.0)
MCV: 80.6 fL (ref 78.0–100.0)
Monocytes Absolute: 0.4 10*3/uL (ref 0.1–1.0)
Monocytes Relative: 9 % (ref 3–12)
Neutro Abs: 2.1 10*3/uL (ref 1.7–7.7)
Neutrophils Relative %: 51 % (ref 43–77)
Platelets: 235 10*3/uL (ref 150–400)
RBC: 4.13 MIL/uL (ref 3.87–5.11)
RDW: 14.5 % (ref 11.5–15.5)
WBC: 4 10*3/uL (ref 4.0–10.5)

## 2012-05-09 NOTE — Patient Instructions (Signed)
Increase your activities even if it's only 5 or 10 minutes at a time.

## 2012-05-09 NOTE — Progress Notes (Signed)
  Subjective:    Patient ID: Kayla [name], female    DOB: 1940/10/14, 71 y.o.   MRN: [id number]  HPI She is here for recheck. She continues on medications listed in the chart. She has no particular concerns or complaints about her medications She has had difficulty recently with low back and hip discomfort especially with increased physical activities. She also notes slight discomfort when she sits for long period of time. She does check her blood sugars intermittently she does not complaining of any difficulties from hypoglycemia,. Her last eye exam was May. She does see Dr. Charlsie Merles for her foot care. She has a mammogram set up in the near future. Her social history was reviewed.Laboratory data was reviewed. She does have a slight anemia.  Review of Systems     Objective:   Physical Exam Alert and in no distress otherwise not examined. Hemoglobin A1c is 6.6       Assessment & Plan:   1. Diabetes mellitus  POCT glycosylated hemoglobin (Hb A1C)  2. Hyperlipidemia LDL goal <70    3. Hypertension associated with diabetes    4. Obesity    5. Anemia  CBC with Differential  Curvature to become more physically active even if it for shorter periods of time. Recommended Tylenol for her arthritis symptoms.

## 2012-05-10 NOTE — Progress Notes (Signed)
Quick Note:  The hemoglobin continues to rise. Have her continue on a multivitamin with iron ______

## 2012-05-20 LAB — HM MAMMOGRAPHY: HM Mammogram: NEGATIVE

## 2012-05-21 ENCOUNTER — Encounter: Payer: Self-pay | Admitting: Internal Medicine

## 2012-07-04 ENCOUNTER — Other Ambulatory Visit: Payer: Self-pay | Admitting: Family Medicine

## 2012-07-29 ENCOUNTER — Telehealth: Payer: Self-pay | Admitting: Family Medicine

## 2012-07-29 NOTE — Telephone Encounter (Signed)
PT CALLED AND STATED THAT SHE HAS A NEW MAIL ORDER PHARM. NEEDS REFILLS SENT TO [facility]. LIPITOR, COREG, METFORMIN AND HYZAAR. PLEASE NOTE THIS IS A NEW PHARMACY.

## 2012-07-29 NOTE — Telephone Encounter (Signed)
Pt account # for [employer] [id number]

## 2012-08-01 ENCOUNTER — Other Ambulatory Visit: Payer: Self-pay

## 2012-08-01 DIAGNOSIS — E1159 Type 2 diabetes mellitus with other circulatory complications: Secondary | ICD-10-CM

## 2012-08-01 DIAGNOSIS — I152 Hypertension secondary to endocrine disorders: Secondary | ICD-10-CM

## 2012-08-01 MED ORDER — METFORMIN HCL 500 MG PO TABS: 500.0000 mg | ORAL_TABLET | Freq: Two times a day (BID) | ORAL | Status: DC

## 2012-08-01 MED ORDER — LOSARTAN POTASSIUM-HCTZ 50-12.5 MG PO TABS: 1.0000 | ORAL_TABLET | Freq: Every day | ORAL | Status: DC

## 2012-08-01 MED ORDER — ATORVASTATIN CALCIUM 80 MG PO TABS: 80.0000 mg | ORAL_TABLET | Freq: Every day | ORAL | Status: DC

## 2012-08-01 MED ORDER — CARVEDILOL 3.125 MG PO TABS: 3.1250 mg | ORAL_TABLET | Freq: Two times a day (BID) | ORAL | Status: DC

## 2012-08-01 NOTE — Telephone Encounter (Signed)
Renew these meds 

## 2012-08-01 NOTE — Telephone Encounter (Signed)
SENT IN MEDS TO [facility] LIPITOR, COREG, METFORMIN, HAYZAAR

## 2012-08-19 ENCOUNTER — Telehealth: Payer: Self-pay | Admitting: Internal Medicine

## 2012-08-19 NOTE — Telephone Encounter (Signed)
Okay to renew

## 2012-08-20 ENCOUNTER — Other Ambulatory Visit: Payer: Self-pay

## 2012-08-20 MED ORDER — POLYSACCHARIDE IRON COMPLEX 150 MG PO CAPS: 150.0000 mg | ORAL_CAPSULE | Freq: Two times a day (BID) | ORAL | Status: DC

## 2012-08-20 NOTE — Telephone Encounter (Signed)
IRON SENT IN PER [facility]

## 2012-08-26 ENCOUNTER — Other Ambulatory Visit: Payer: Self-pay

## 2012-08-26 MED ORDER — IRON POLYSACCH CMPLX-B12-FA 150-0.025-1 MG PO CAPS: 150.0000 mg | ORAL_CAPSULE | Freq: Two times a day (BID) | ORAL | Status: DC

## 2012-08-26 NOTE — Telephone Encounter (Signed)
SENT MED IN 

## 2012-09-09 ENCOUNTER — Encounter: Payer: Self-pay | Admitting: Family Medicine

## 2012-09-09 ENCOUNTER — Ambulatory Visit (INDEPENDENT_AMBULATORY_CARE_PROVIDER_SITE_OTHER): Payer: Medicare Other | Admitting: Family Medicine

## 2012-09-09 VITALS — BP 122/80 | HR 78 | Wt 229.0 lb

## 2012-09-09 DIAGNOSIS — I152 Hypertension secondary to endocrine disorders: Secondary | ICD-10-CM

## 2012-09-09 DIAGNOSIS — E1149 Type 2 diabetes mellitus with other diabetic neurological complication: Secondary | ICD-10-CM

## 2012-09-09 DIAGNOSIS — E119 Type 2 diabetes mellitus without complications: Secondary | ICD-10-CM

## 2012-09-09 DIAGNOSIS — E785 Hyperlipidemia, unspecified: Secondary | ICD-10-CM

## 2012-09-09 DIAGNOSIS — Z79899 Other long term (current) drug therapy: Secondary | ICD-10-CM

## 2012-09-09 DIAGNOSIS — E1169 Type 2 diabetes mellitus with other specified complication: Secondary | ICD-10-CM

## 2012-09-09 DIAGNOSIS — E669 Obesity, unspecified: Secondary | ICD-10-CM

## 2012-09-09 DIAGNOSIS — E1159 Type 2 diabetes mellitus with other circulatory complications: Secondary | ICD-10-CM

## 2012-09-09 DIAGNOSIS — I10 Essential (primary) hypertension: Secondary | ICD-10-CM

## 2012-09-09 DIAGNOSIS — D649 Anemia, unspecified: Secondary | ICD-10-CM

## 2012-09-09 DIAGNOSIS — E1140 Type 2 diabetes mellitus with diabetic neuropathy, unspecified: Secondary | ICD-10-CM

## 2012-09-09 DIAGNOSIS — E1142 Type 2 diabetes mellitus with diabetic polyneuropathy: Secondary | ICD-10-CM

## 2012-09-09 LAB — COMPREHENSIVE METABOLIC PANEL
ALT: 12 U/L (ref 0–35)
AST: 14 U/L (ref 0–37)
Albumin: 3.8 g/dL (ref 3.5–5.2)
Alkaline Phosphatase: 78 U/L (ref 39–117)
BUN: 19 mg/dL (ref 6–23)
CO2: 28 mEq/L (ref 19–32)
Calcium: 9.3 mg/dL (ref 8.4–10.5)
Chloride: 104 mEq/L (ref 96–112)
Creat: 0.81 mg/dL (ref 0.50–1.10)
Glucose, Bld: 100 mg/dL — ABNORMAL HIGH (ref 70–99)
Potassium: 4.3 mEq/L (ref 3.5–5.3)
Sodium: 139 mEq/L (ref 135–145)
Total Bilirubin: 0.4 mg/dL (ref 0.3–1.2)
Total Protein: 7.1 g/dL (ref 6.0–8.3)

## 2012-09-09 LAB — CBC WITH DIFFERENTIAL/PLATELET
Basophils Absolute: 0.1 10*3/uL (ref 0.0–0.1)
Basophils Relative: 1 % (ref 0–1)
Eosinophils Absolute: 0.1 10*3/uL (ref 0.0–0.7)
Eosinophils Relative: 4 % (ref 0–5)
HCT: 33.1 % — ABNORMAL LOW (ref 36.0–46.0)
Hemoglobin: 10.7 g/dL — ABNORMAL LOW (ref 12.0–15.0)
Lymphocytes Relative: 38 % (ref 12–46)
Lymphs Abs: 1.4 10*3/uL (ref 0.7–4.0)
MCH: 26.7 pg (ref 26.0–34.0)
MCHC: 32.3 g/dL (ref 30.0–36.0)
MCV: 82.5 fL (ref 78.0–100.0)
Monocytes Absolute: 0.4 10*3/uL (ref 0.1–1.0)
Monocytes Relative: 11 % (ref 3–12)
Neutro Abs: 1.7 10*3/uL (ref 1.7–7.7)
Neutrophils Relative %: 46 % (ref 43–77)
Platelets: 257 10*3/uL (ref 150–400)
RBC: 4.01 MIL/uL (ref 3.87–5.11)
RDW: 14.4 % (ref 11.5–15.5)
WBC: 3.6 10*3/uL — ABNORMAL LOW (ref 4.0–10.5)

## 2012-09-09 LAB — LIPID PANEL
Cholesterol: 259 mg/dL — ABNORMAL HIGH (ref 0–200)
HDL: 56 mg/dL (ref >39)
LDL Cholesterol: 189 mg/dL — ABNORMAL HIGH (ref 0–99)
Total CHOL/HDL Ratio: 4.6 Ratio
Triglycerides: 72 mg/dL (ref <150)
VLDL: 14 mg/dL (ref 0–40)

## 2012-09-09 LAB — POCT GLYCOSYLATED HEMOGLOBIN (HGB A1C): Hemoglobin A1C: 6.3

## 2012-09-09 NOTE — Progress Notes (Signed)
  Subjective:    Patient ID: Kayla [name], female    DOB: [birth year], 72 y.o.   MRN: [id number]  HPI  she is here for a recheck.she continues on medications listed in the chart.she is presently on multiple OTC meds including iron supplement as well as multivitamins. She does have a history of anemia. She does plan on getting involved in an exercise program through [employer]. She gets her feet checked regularly and still has difficulty with that. She has an eye exam every October. Smoking and drinking were reviewed.   Review of Systems     Objective:   Physical Exam Alert and in no distress. Foot exam is recorded -- evidence of neuropathy as well as thickening especially of the heels       Assessment & Plan:   1. Diabetes mellitus  POCT glycosylated hemoglobin (Hb A1C)   POCT glycosylated hemoglobin (Hb A1C)  2. Diabetic neuropathy, type II diabetes mellitus    3. Obesity    4. Hyperlipidemia LDL goal <70    5. Anemia  CBC with Differential   CBC with Differential  6. Hypertension associated with diabetes    7. Encounter for long-term (current) use of other medications  CBC with Differential   CBC with Differential   Lipid panel   Comprehensive metabolic panel  encouraged her to become more physically active especially going to silver sneakers.also discussed the fact that she needs to be very careful with  her feet.she states she might see a different podiatrist to get their input into better foot care.

## 2012-09-10 NOTE — Progress Notes (Signed)
Quick Note:  Reinforce taking her Lipitor. I have a feeling that she stopped. ______

## 2012-09-10 NOTE — Progress Notes (Signed)
Quick Note:  Pt informed and verbalized understanding . ______ 

## 2012-10-01 ENCOUNTER — Other Ambulatory Visit: Payer: Self-pay | Admitting: Family Medicine

## 2012-10-11 ENCOUNTER — Telehealth: Payer: Self-pay | Admitting: Internal Medicine

## 2012-10-11 ENCOUNTER — Telehealth: Payer: Self-pay

## 2012-10-11 NOTE — Telephone Encounter (Signed)
Left message to stop b-12 cod liver oil just take a good multi and iron as far as vitamins go

## 2012-10-11 NOTE — Telephone Encounter (Signed)
PT CALLED STATING YOU TOLD HER TO STOP SOME OF HER VITAMINS HER LAST OV AND SHE WANTED TO KNOW WHICH ONES PLEASE ADVISE

## 2012-10-11 NOTE — Telephone Encounter (Signed)
error 

## 2012-10-11 NOTE — Telephone Encounter (Signed)
She does needs an multivitamin and iron

## 2012-11-01 ENCOUNTER — Ambulatory Visit (INDEPENDENT_AMBULATORY_CARE_PROVIDER_SITE_OTHER): Payer: Medicare Other | Admitting: Family Medicine

## 2012-11-01 VITALS — BP 122/74 | HR 64 | Wt 226.0 lb

## 2012-11-01 DIAGNOSIS — Z96651 Presence of right artificial knee joint: Secondary | ICD-10-CM

## 2012-11-01 DIAGNOSIS — M25519 Pain in unspecified shoulder: Secondary | ICD-10-CM

## 2012-11-01 DIAGNOSIS — Z96659 Presence of unspecified artificial knee joint: Secondary | ICD-10-CM

## 2012-11-01 DIAGNOSIS — M25511 Pain in right shoulder: Secondary | ICD-10-CM

## 2012-11-01 NOTE — Progress Notes (Signed)
  Subjective:    Patient ID: Kayla [name], female    DOB: [birth year], 72 y.o.   MRN: [id number]  HPI 3 days ago she woke up with right shoulder and neck pain. No numbness, tingling or weakness. She does complain of pain going down her armShe is still having difficulty with postnasal drainage. She has been taking Tylenol. He also complains of a several month history of intermittent right thigh discomfort. She states that when she does have this it does get worse when she stands. She has a previous history of total knee replacement and apparently has a rod as well.   Review of Systems     Objective:   Physical Exam Exam of her right shoulder and back shows a trigger point in the trapezius. Good motion of the shoulder and neck. Normal motor sensory and DTRs. Exam of her right leg shows motion of the knee with no pain. Hip motion causes no discomfort. She has no tenderness to palpation of the thigh area.       Assessment & Plan:  Right shoulder pain  History of total knee replacement, right recommend heat and stretching for the right shoulder pain. I will refer to orthopedics for followup on the thigh pain as this could be related to her TKR.

## 2012-11-01 NOTE — Patient Instructions (Signed)
Heat for 20 minutes 3 times per day and when you do the heat then do some stretching of your neck and shoulder.keep taking the Tylenol you can have 2 tablets 4 times a day.

## 2012-12-19 ENCOUNTER — Other Ambulatory Visit: Payer: Self-pay | Admitting: Cardiology

## 2012-12-19 NOTE — Telephone Encounter (Signed)
Rx was sent to pharmacy electronically. 

## 2012-12-20 ENCOUNTER — Other Ambulatory Visit: Payer: Self-pay | Admitting: Family Medicine

## 2013-01-06 ENCOUNTER — Encounter: Payer: Self-pay | Admitting: Family Medicine

## 2013-01-06 ENCOUNTER — Ambulatory Visit (INDEPENDENT_AMBULATORY_CARE_PROVIDER_SITE_OTHER): Payer: Medicare Other | Admitting: Family Medicine

## 2013-01-06 VITALS — BP 132/90 | HR 60 | Wt 221.0 lb

## 2013-01-06 DIAGNOSIS — D649 Anemia, unspecified: Secondary | ICD-10-CM

## 2013-01-06 DIAGNOSIS — E1159 Type 2 diabetes mellitus with other circulatory complications: Secondary | ICD-10-CM

## 2013-01-06 DIAGNOSIS — E1169 Type 2 diabetes mellitus with other specified complication: Secondary | ICD-10-CM

## 2013-01-06 DIAGNOSIS — E669 Obesity, unspecified: Secondary | ICD-10-CM

## 2013-01-06 DIAGNOSIS — I10 Essential (primary) hypertension: Secondary | ICD-10-CM

## 2013-01-06 DIAGNOSIS — E119 Type 2 diabetes mellitus without complications: Secondary | ICD-10-CM

## 2013-01-06 DIAGNOSIS — I152 Hypertension secondary to endocrine disorders: Secondary | ICD-10-CM

## 2013-01-06 DIAGNOSIS — E785 Hyperlipidemia, unspecified: Secondary | ICD-10-CM

## 2013-01-06 NOTE — Progress Notes (Signed)
  Subjective:    Patient ID: Kayla [name], female    DOB: [birth year], 72 y.o.   MRN: [id number]  HPI She is here for recheck on her diabetes. She continues on medications listed in the chart. She has lost a few pounds. She is going to [employer]. Social history was reviewed and is unchanged. She continues on her iron supplementation. She does check her blood sugars periodically. She had an eye exam done within the last month or 2. Her social history was reviewed.   Review of Systems     Objective:   Physical Exam Alert and in no distress otherwise not examined. Hemoglobin A1c is 6.6.       Assessment & Plan:  Obesity  Diabetes mellitus  Hyperlipidemia LDL goal <70  Anemia  Hypertension associated with diabetes I encouraged her to continue with her present lifestyle and medications. Recheck here in 4 months.

## 2013-01-06 NOTE — Patient Instructions (Signed)
Check your blood sugar either before a meal or 2 hours after a meal 

## 2013-01-10 ENCOUNTER — Encounter: Payer: Self-pay | Admitting: Family Medicine

## 2013-02-01 ENCOUNTER — Other Ambulatory Visit: Payer: Self-pay | Admitting: Family Medicine

## 2013-02-04 ENCOUNTER — Encounter: Payer: Self-pay | Admitting: Family Medicine

## 2013-02-04 ENCOUNTER — Ambulatory Visit (INDEPENDENT_AMBULATORY_CARE_PROVIDER_SITE_OTHER): Payer: Medicare Other | Admitting: Family Medicine

## 2013-02-04 VITALS — BP 120/76 | HR 81 | Wt 222.0 lb

## 2013-02-04 DIAGNOSIS — S20219A Contusion of unspecified front wall of thorax, initial encounter: Secondary | ICD-10-CM

## 2013-02-04 DIAGNOSIS — R42 Dizziness and giddiness: Secondary | ICD-10-CM

## 2013-02-04 DIAGNOSIS — S20211A Contusion of right front wall of thorax, initial encounter: Secondary | ICD-10-CM

## 2013-02-04 NOTE — Patient Instructions (Signed)
Pay attention to the dizziness and see if any makes it better or worse and let me know

## 2013-02-04 NOTE — Progress Notes (Signed)
  Subjective:    Patient ID: Kayla [name], female    DOB: [birth year], 72 y.o.   MRN: [id number]  HPI She fell yesterday going from the kitchen into her bedroom. She does not remember exactly what happened but does complain of right anterior rib pain. Since then she has had difficulty with dizziness but no blurred or double vision, headache, nausea or vomiting.   Review of Systems     Objective:   Physical Exam Alert and in no distress. Minimal tenderness palpation of the right anterior ribs. Abdominal exam shows no masses or tenderness. Cardiac exam shows regular rhythm without murmurs or gallops. EOMI. Cerebellar testing is negative.      Assessment & Plan:  Dizziness  Contusion of rib, right, initial encounter  conservative care for the rib contusion. Discussed dizziness with her and since she is having no symptoms, watchful waiting is appropriate. Instructed her to call if any symptoms change.

## 2013-02-17 ENCOUNTER — Telehealth: Payer: Self-pay | Admitting: Family Medicine

## 2013-02-17 MED ORDER — GLUCOSE BLOOD VI STRP: ORAL_STRIP | Status: DC

## 2013-02-17 NOTE — Telephone Encounter (Signed)
Pt called and stated she needs test strips. She states they are for machine one touch ultra blue. Please send into [facility] [address]

## 2013-02-17 NOTE — Telephone Encounter (Signed)
SENT IN TEST STRIPS 

## 2013-02-24 ENCOUNTER — Other Ambulatory Visit: Payer: Self-pay | Admitting: Family Medicine

## 2013-04-30 ENCOUNTER — Other Ambulatory Visit: Payer: Self-pay | Admitting: Family Medicine

## 2013-05-07 ENCOUNTER — Telehealth: Payer: Self-pay | Admitting: Family Medicine

## 2013-05-07 MED ORDER — POLYSACCHARIDE IRON COMPLEX 150 MG PO CAPS: ORAL_CAPSULE | ORAL | Status: DC

## 2013-05-07 NOTE — Telephone Encounter (Signed)
Ferex. renewed

## 2013-05-08 ENCOUNTER — Encounter: Payer: Self-pay | Admitting: Family Medicine

## 2013-05-08 ENCOUNTER — Ambulatory Visit (INDEPENDENT_AMBULATORY_CARE_PROVIDER_SITE_OTHER): Payer: Medicare Other | Admitting: Family Medicine

## 2013-05-08 VITALS — BP 120/70 | HR 79 | Wt 218.0 lb

## 2013-05-08 DIAGNOSIS — E785 Hyperlipidemia, unspecified: Secondary | ICD-10-CM

## 2013-05-08 DIAGNOSIS — E1149 Type 2 diabetes mellitus with other diabetic neurological complication: Secondary | ICD-10-CM

## 2013-05-08 DIAGNOSIS — E1159 Type 2 diabetes mellitus with other circulatory complications: Secondary | ICD-10-CM

## 2013-05-08 DIAGNOSIS — Z23 Encounter for immunization: Secondary | ICD-10-CM

## 2013-05-08 DIAGNOSIS — E1169 Type 2 diabetes mellitus with other specified complication: Secondary | ICD-10-CM

## 2013-05-08 DIAGNOSIS — I10 Essential (primary) hypertension: Secondary | ICD-10-CM

## 2013-05-08 DIAGNOSIS — E1142 Type 2 diabetes mellitus with diabetic polyneuropathy: Secondary | ICD-10-CM

## 2013-05-08 DIAGNOSIS — I152 Hypertension secondary to endocrine disorders: Secondary | ICD-10-CM

## 2013-05-08 DIAGNOSIS — E669 Obesity, unspecified: Secondary | ICD-10-CM

## 2013-05-08 DIAGNOSIS — E1140 Type 2 diabetes mellitus with diabetic neuropathy, unspecified: Secondary | ICD-10-CM

## 2013-05-08 LAB — POCT GLYCOSYLATED HEMOGLOBIN (HGB A1C): Hemoglobin A1C: 6.3

## 2013-05-08 NOTE — Progress Notes (Signed)
  Subjective:    Kayla [name] is a 71 y.o. female who presents for follow-up of Type 2 diabetes mellitus.    Home blood sugar records: 106  Current symptoms/problems NO Daily foot checks:    Any foot concerns:  YES/THICKNESS OF SKIN SEE'S PODIATRIST  Last eye exam:  01/10/13   Medication compliance: Good. Current diet: STAY AWAY FROM CARBS,SODIUM,SUGAR Current exercise: RUSH Known diabetic complications: none Cardiovascular risk factors: advanced age (older than 77 for men, 95 for women), diabetes mellitus, dyslipidemia, hypertension, obesity (BMI >= 30 kg/m2) and sedentary lifestyle She also complains of continued difficulty with nasal congestion and rhinorrhea.  The following portions of the patient's history were reviewed and updated as appropriate: allergies, current medications, past medical history, past social history and problem list.  ROS as in subjective above    Objective:  Hemoglobin A1c 6.3  General appearence: alert, no distress, WD/WN Exam of her toes does show evidence of onychomycosis as well as curling of the great toes.   Lab Review Lab Results  Component Value Date   HGBA1C 6.3 09/09/2012   Lab Results  Component Value Date   CHOL 259* 09/09/2012   HDL 56 09/09/2012   LDLCALC 189* 09/09/2012   TRIG 72 09/09/2012   CHOLHDL 4.6 09/09/2012   No results found for this basenameConcepcion Elk     Chemistry      Component Value Date/Time   NA 139 09/09/2012 1151   K 4.3 09/09/2012 1151   CL 104 09/09/2012 1151   CO2 28 09/09/2012 1151   BUN 19 09/09/2012 1151   CREATININE 0.81 09/09/2012 1151   CREATININE 0.80 02/04/2011 1621      Component Value Date/Time   CALCIUM 9.3 09/09/2012 1151   ALKPHOS 78 09/09/2012 1151   AST 14 09/09/2012 1151   ALT 12 09/09/2012 1151   BILITOT 0.4 09/09/2012 1151        Chemistry      Component Value Date/Time   NA 139 09/09/2012 1151   K 4.3 09/09/2012 1151   CL 104 09/09/2012 1151   CO2 28 09/09/2012 1151   BUN 19  09/09/2012 1151   CREATININE 0.81 09/09/2012 1151   CREATININE 0.80 02/04/2011 1621      Component Value Date/Time   CALCIUM 9.3 09/09/2012 1151   ALKPHOS 78 09/09/2012 1151   AST 14 09/09/2012 1151   ALT 12 09/09/2012 1151   BILITOT 0.4 09/09/2012 1151       Last optometry/ophthalmology exam reviewed from:    Assessment:   Encounter Diagnoses  Name Primary?  . Diabetic neuropathy, type II diabetes mellitus Yes  . Need for prophylactic vaccination and inoculation against influenza   . Hypertension associated with diabetes   . Hyperlipidemia LDL goal <70   . Obesity            Plan:    1.  Rx changes: none 2.  Education: Reviewed 'ABCs' of diabetes management (respective goals in parentheses):  A1C (<7), blood pressure (<130/80), and cholesterol (LDL <100). 3.  Compliance at present is estimated to be good. Efforts to improve compliance (if necessary) will be directed at increased exercise. 4. Follow up: 4 months  Try Zyrtec if that doesn't work you can also try the new over-the-counter nasal spray called Flonase

## 2013-05-08 NOTE — Patient Instructions (Signed)
Try Zyrtec if that doesn't work you can also try the new over-the-counter nasal spray called Flonase

## 2013-05-13 ENCOUNTER — Telehealth: Payer: Self-pay

## 2013-05-13 NOTE — Telephone Encounter (Signed)
Pt advised of appointment at [facility] for [facility] brachial index Friday 17th at 10 am pt verbalized understanding

## 2013-05-16 ENCOUNTER — Ambulatory Visit (HOSPITAL_COMMUNITY)
Admission: RE | Admit: 2013-05-16 | Discharge: 2013-05-16 | Disposition: A | Discharge status: Home / Self Care | Payer: Medicare Other | Source: Ambulatory Visit | Attending: Family Medicine | Admitting: Family Medicine

## 2013-05-16 DIAGNOSIS — E1159 Type 2 diabetes mellitus with other circulatory complications: Secondary | ICD-10-CM

## 2013-05-16 DIAGNOSIS — E785 Hyperlipidemia, unspecified: Secondary | ICD-10-CM

## 2013-05-16 DIAGNOSIS — E1140 Type 2 diabetes mellitus with diabetic neuropathy, unspecified: Secondary | ICD-10-CM

## 2013-05-16 DIAGNOSIS — E119 Type 2 diabetes mellitus without complications: Secondary | ICD-10-CM

## 2013-05-16 DIAGNOSIS — I152 Hypertension secondary to endocrine disorders: Secondary | ICD-10-CM

## 2013-05-16 DIAGNOSIS — E669 Obesity, unspecified: Secondary | ICD-10-CM

## 2013-05-16 DIAGNOSIS — M79609 Pain in unspecified limb: Secondary | ICD-10-CM

## 2013-05-16 NOTE — Progress Notes (Signed)
VASCULAR LAB PRELIMINARY  ARTERIAL  ABI completed:    RIGHT    LEFT    PRESSURE WAVEFORM  PRESSURE WAVEFORM  BRACHIAL 169 Triphasic BRACHIAL 166 Triphasic  DP 160 Triphasic DP 154 Triphasic  PT 165 Triphasic PT 153 Biphasic    RIGHT LEFT  ABI 0.98 0.91   ABIs are normal on the right with a minimal reduction on the left. Doppler waveforms were within normal limits bilaterally at rest.  Anni Hocevar, RVS 05/16/2013, 3:27 PM

## 2013-05-19 ENCOUNTER — Ambulatory Visit (INDEPENDENT_AMBULATORY_CARE_PROVIDER_SITE_OTHER): Payer: Medicare Other | Admitting: Podiatry

## 2013-05-19 ENCOUNTER — Encounter: Payer: Self-pay | Admitting: Podiatry

## 2013-05-19 VITALS — BP 138/65 | HR 70 | Resp 18

## 2013-05-19 DIAGNOSIS — B351 Tinea unguium: Secondary | ICD-10-CM

## 2013-05-19 DIAGNOSIS — M79609 Pain in unspecified limb: Secondary | ICD-10-CM

## 2013-05-19 NOTE — Progress Notes (Signed)
Quick Note:  PT INFORMED AND VERBALIZED UNDERSTANDING  ______ 

## 2013-05-19 NOTE — Progress Notes (Signed)
°  Subjective:    Patient ID: Kayla [name], female    DOB: 1941-02-26, 72 y.o.   MRN: [id number]  HPI trim my nails and and my doctor said this toenail is infected     Review of Systems  Constitutional: Negative.   HENT:       Mucus in throat  Eyes: Negative.   Respiratory: Negative.   Cardiovascular: Negative.   Gastrointestinal: Negative.   Endocrine: Positive for cold intolerance and heat intolerance.       Diabetic   Genitourinary: Negative.   Musculoskeletal: Negative.   Skin: Negative.   Allergic/Immunologic: Negative.   Neurological: Negative.   Hematological: Negative.   Psychiatric/Behavioral: Negative.        Objective:   Physical Exam        Assessment & Plan:

## 2013-05-20 NOTE — Progress Notes (Signed)
Subjective:     Patient ID: Kayla [name], female   DOB: 1941-01-03, 72 y.o.   MRN: [id number]  HPI patient states that she needs her nails cut they are painful and she feels 1 may be infected. Points to the right hallux  Review of Systems  All other systems reviewed and are negative.       Objective:   Physical Exam  Cardiovascular: Intact distal pulses.   Musculoskeletal: Normal range of motion.   nails are incurvated 1 through 5 bilateral. I did not note any redness or drainage of the hallux nail right except for discomfort secondary to nail position     Assessment:     Mycotic nail infection 1-5 bilateral with pain    Plan:     Debridement nailbeds 1-5 bilateral no iatrogenic bleeding noted

## 2013-05-26 LAB — HM MAMMOGRAPHY: HM Mammogram: NEGATIVE

## 2013-05-28 ENCOUNTER — Encounter: Payer: Self-pay | Admitting: Internal Medicine

## 2013-06-05 ENCOUNTER — Encounter: Payer: Self-pay | Admitting: Family Medicine

## 2013-06-07 LAB — HM COLONOSCOPY

## 2013-07-25 ENCOUNTER — Other Ambulatory Visit: Payer: Self-pay | Admitting: Family Medicine

## 2013-07-28 ENCOUNTER — Other Ambulatory Visit: Payer: Self-pay | Admitting: Family Medicine

## 2013-08-18 ENCOUNTER — Ambulatory Visit: Payer: Medicare Other | Admitting: Podiatry

## 2013-08-27 ENCOUNTER — Ambulatory Visit: Payer: Medicare Other | Admitting: Podiatrist

## 2013-09-01 ENCOUNTER — Ambulatory Visit: Payer: Medicare Other | Admitting: Podiatry

## 2013-09-11 ENCOUNTER — Ambulatory Visit (INDEPENDENT_AMBULATORY_CARE_PROVIDER_SITE_OTHER): Payer: Medicare Other | Admitting: Family Medicine

## 2013-09-11 ENCOUNTER — Encounter: Payer: Self-pay | Admitting: Family Medicine

## 2013-09-11 VITALS — BP 130/64 | HR 80 | Wt 220.0 lb

## 2013-09-11 DIAGNOSIS — J309 Allergic rhinitis, unspecified: Secondary | ICD-10-CM

## 2013-09-11 NOTE — Patient Instructions (Signed)
Try Allegra regularly to see what to do. Also you use a nasal steroid spray which can help stop that drainage

## 2013-09-11 NOTE — Progress Notes (Signed)
   Subjective:    Patient ID: Kayla [name], female    DOB: 1940/08/09, 73 y.o.   MRN: [id number]  HPI She is here apparently because she has been having a great deal of difficulty with PND. She states she has been having this her whole life. She states that her gastroenterologist once this cleared up before he doesn't EGD. No sneezing, itchy watery eyes, runny nose. She has tried Claritin and Mucinex in the past with little success.   Review of Systems     Objective:   Physical Exam alert and in no distress. Tympanic membranes and canals are normal. Throat is clear. Tonsils are normal. Neck is supple without adenopathy or thyromegaly. Cardiac exam shows a regular sinus rhythm without murmurs or gallops. Lungs are clear to auscultation.        Assessment & Plan:  Allergic rhinitis  recommend she try Allegra to help with her symptoms and if that doesn't work she is to call me and I will place her on a nasal steroid spray.

## 2013-09-24 ENCOUNTER — Encounter: Payer: Self-pay | Admitting: Podiatrist

## 2013-09-24 ENCOUNTER — Ambulatory Visit (INDEPENDENT_AMBULATORY_CARE_PROVIDER_SITE_OTHER): Payer: Medicare Other | Admitting: Podiatrist

## 2013-09-24 VITALS — BP 129/61 | HR 85 | Resp 16

## 2013-09-24 DIAGNOSIS — B351 Tinea unguium: Secondary | ICD-10-CM

## 2013-09-24 DIAGNOSIS — M79609 Pain in unspecified limb: Secondary | ICD-10-CM

## 2013-09-24 NOTE — Patient Instructions (Signed)
Diabetes and Foot Care Diabetes may cause you to have problems because of poor blood supply (circulation) to your feet and legs. This may cause the skin on your feet to become thinner, break easier, and heal more slowly. Your skin may become dry, and the skin may peel and crack. You may also have nerve damage in your legs and feet causing decreased feeling in them. You may not notice minor injuries to your feet that could lead to infections or more serious problems. Taking care of your feet is one of the most important things you can do for yourself.  HOME CARE INSTRUCTIONS  Wear shoes at all times, even in the house. Do not go barefoot. Bare feet are easily injured.  Check your feet daily for blisters, cuts, and redness. If you cannot see the bottom of your feet, use a mirror or ask someone for help.  Wash your feet with warm water (do not use hot water) and mild soap. Then pat your feet and the areas between your toes until they are completely dry. Do not soak your feet as this can dry your skin.  Apply a moisturizing lotion or petroleum jelly (that does not contain alcohol and is unscented) to the skin on your feet and to dry, brittle toenails. Do not apply lotion between your toes.  Trim your toenails straight across. Do not dig under them or around the cuticle. File the edges of your nails with an emery board or nail file.  Do not cut corns or calluses or try to remove them with medicine.  Wear clean socks or stockings every day. Make sure they are not too tight. Do not wear knee-high stockings since they may decrease blood flow to your legs.  Wear shoes that fit properly and have enough cushioning. To break in new shoes, wear them for just a few hours a day. This prevents you from injuring your feet. Always look in your shoes before you put them on to be sure there are no objects inside.  Do not cross your legs. This may decrease the blood flow to your feet.  If you find a minor scrape,  cut, or break in the skin on your feet, keep it and the skin around it clean and dry. These areas may be cleansed with mild soap and water. Do not cleanse the area with peroxide, alcohol, or iodine.  When you remove an adhesive bandage, be sure not to damage the skin around it.  If you have a wound, look at it several times a day to make sure it is healing.  Do not use heating pads or hot water bottles. They may burn your skin. If you have lost feeling in your feet or legs, you may not know it is happening until it is too late.  Make sure your health care provider performs a complete foot exam at least annually or more often if you have foot problems. Report any cuts, sores, or bruises to your health care provider immediately. SEEK MEDICAL CARE IF:   You have an injury that is not healing.  You have cuts or breaks in the skin.  You have an ingrown nail.  You notice redness on your legs or feet.  You feel burning or tingling in your legs or feet.  You have pain or cramps in your legs and feet.  Your legs or feet are numb.  Your feet always feel cold. SEEK IMMEDIATE MEDICAL CARE IF:   There is increasing redness,   swelling, or pain in or around a wound.  There is a red line that goes up your leg.  Pus is coming from a wound.  You develop a fever or as directed by your health care provider.  You notice a bad smell coming from an ulcer or wound. Document Released: 07/14/2000 Document Revised: 03/19/2013 Document Reviewed: 12/24/2012 [employer] Patient Information 2014 [employer].  

## 2013-09-24 NOTE — Progress Notes (Signed)
Subjective: Patient presents today for diabetic foot and nail care. She states she's having a significant amount of pain of bilateral great toenails and would like them numbed up before having them trim. Overall she states she's doing well and her diabetes is in acceptable range.  Objective: Vascular status is intact with palpable pedal pulses at 2/4 DP and 1/4 PT bilateral. Capillary refill time is within normal limits bilateral. Pes planovalgus deformity is present with mild contracture deformity of lesser digits. Bilateral hallux nails are severely incurvated on the medial and lateral nail border and painful with pressure. The remainder of the nails are thickened, discolored, dystrophic, clinically mycotic and uncomfortable. She recently had an ABI performed 05/16/2013 with ABI on the right of 0.98 an ABI on the left of 0.91.  Assessment: Symptomatic mycotic toenails, diabetes with neuropathy  Plan: Patient's toenails were debrided without complication at today's visit. Bilateral hallux nails were anesthetized per her request with lidocaine and Marcaine mixture without complication. She will be seen back in 3 months for continued care and if any problems or concerns arise prior that visit she is instructed to call immediately.

## 2013-09-25 ENCOUNTER — Ambulatory Visit: Payer: Medicare Other | Admitting: Podiatrist

## 2013-10-09 ENCOUNTER — Telehealth: Payer: Self-pay | Admitting: Internal Medicine

## 2013-10-09 MED ORDER — ATORVASTATIN CALCIUM 80 MG PO TABS: 80.0000 mg | ORAL_TABLET | Freq: Every day | ORAL | Status: DC

## 2013-10-09 NOTE — Telephone Encounter (Signed)
Refill request for atorvastatin #90 to [facility]

## 2013-10-09 NOTE — Telephone Encounter (Signed)
Medication sent in. 

## 2013-10-17 ENCOUNTER — Telehealth: Payer: Self-pay | Admitting: Family Medicine

## 2013-10-20 NOTE — Telephone Encounter (Signed)
Have her try Flonase which is now over-the-counter. If she has difficulty with that, then I will call in a prescription.

## 2013-10-20 NOTE — Telephone Encounter (Signed)
Pt.notified

## 2013-11-03 ENCOUNTER — Telehealth: Payer: Self-pay | Admitting: Family Medicine

## 2013-11-03 ENCOUNTER — Other Ambulatory Visit: Payer: Self-pay | Admitting: Family Medicine

## 2013-11-03 NOTE — Telephone Encounter (Signed)
Medication sent in. 

## 2013-11-06 ENCOUNTER — Encounter: Payer: Self-pay | Admitting: Family Medicine

## 2013-11-06 ENCOUNTER — Ambulatory Visit (INDEPENDENT_AMBULATORY_CARE_PROVIDER_SITE_OTHER): Payer: Medicare Other | Admitting: Family Medicine

## 2013-11-06 VITALS — BP 130/75 | HR 68 | Wt 220.0 lb

## 2013-11-06 DIAGNOSIS — E785 Hyperlipidemia, unspecified: Secondary | ICD-10-CM

## 2013-11-06 DIAGNOSIS — E1140 Type 2 diabetes mellitus with diabetic neuropathy, unspecified: Secondary | ICD-10-CM

## 2013-11-06 DIAGNOSIS — E1149 Type 2 diabetes mellitus with other diabetic neurological complication: Secondary | ICD-10-CM

## 2013-11-06 DIAGNOSIS — I152 Hypertension secondary to endocrine disorders: Secondary | ICD-10-CM

## 2013-11-06 DIAGNOSIS — E1159 Type 2 diabetes mellitus with other circulatory complications: Secondary | ICD-10-CM

## 2013-11-06 DIAGNOSIS — E119 Type 2 diabetes mellitus without complications: Secondary | ICD-10-CM

## 2013-11-06 DIAGNOSIS — E1169 Type 2 diabetes mellitus with other specified complication: Secondary | ICD-10-CM

## 2013-11-06 DIAGNOSIS — D649 Anemia, unspecified: Secondary | ICD-10-CM

## 2013-11-06 DIAGNOSIS — I10 Essential (primary) hypertension: Secondary | ICD-10-CM

## 2013-11-06 DIAGNOSIS — E669 Obesity, unspecified: Secondary | ICD-10-CM

## 2013-11-06 LAB — CBC WITH DIFFERENTIAL/PLATELET
Basophils Absolute: 0 10*3/uL (ref 0.0–0.1)
Basophils Relative: 1 % (ref 0–1)
Eosinophils Absolute: 0.1 10*3/uL (ref 0.0–0.7)
Eosinophils Relative: 3 % (ref 0–5)
HCT: 31.1 % — ABNORMAL LOW (ref 36.0–46.0)
Hemoglobin: 10.2 g/dL — ABNORMAL LOW (ref 12.0–15.0)
Lymphocytes Relative: 38 % (ref 12–46)
Lymphs Abs: 1.4 10*3/uL (ref 0.7–4.0)
MCH: 26.6 pg (ref 26.0–34.0)
MCHC: 32.8 g/dL (ref 30.0–36.0)
MCV: 81 fL (ref 78.0–100.0)
Monocytes Absolute: 0.4 10*3/uL (ref 0.1–1.0)
Monocytes Relative: 10 % (ref 3–12)
Neutro Abs: 1.7 10*3/uL (ref 1.7–7.7)
Neutrophils Relative %: 48 % (ref 43–77)
Platelets: 247 10*3/uL (ref 150–400)
RBC: 3.84 MIL/uL — ABNORMAL LOW (ref 3.87–5.11)
RDW: 14.5 % (ref 11.5–15.5)
WBC: 3.6 10*3/uL — ABNORMAL LOW (ref 4.0–10.5)

## 2013-11-06 LAB — COMPREHENSIVE METABOLIC PANEL
ALT: 16 U/L (ref 0–35)
AST: 20 U/L (ref 0–37)
Albumin: 3.8 g/dL (ref 3.5–5.2)
Alkaline Phosphatase: 81 U/L (ref 39–117)
BUN: 16 mg/dL (ref 6–23)
CO2: 28 mEq/L (ref 19–32)
Calcium: 9.1 mg/dL (ref 8.4–10.5)
Chloride: 101 mEq/L (ref 96–112)
Creat: 0.72 mg/dL (ref 0.50–1.10)
Glucose, Bld: 99 mg/dL (ref 70–99)
Potassium: 4.4 mEq/L (ref 3.5–5.3)
Sodium: 138 mEq/L (ref 135–145)
Total Bilirubin: 0.4 mg/dL (ref 0.2–1.2)
Total Protein: 6.8 g/dL (ref 6.0–8.3)

## 2013-11-06 LAB — LIPID PANEL
Cholesterol: 157 mg/dL (ref 0–200)
HDL: 53 mg/dL (ref >39)
LDL Cholesterol: 92 mg/dL (ref 0–99)
Total CHOL/HDL Ratio: 3 Ratio
Triglycerides: 61 mg/dL (ref <150)
VLDL: 12 mg/dL (ref 0–40)

## 2013-11-06 LAB — POCT GLYCOSYLATED HEMOGLOBIN (HGB A1C): Hemoglobin A1C: 6.5

## 2013-11-06 NOTE — Progress Notes (Signed)
   Subjective:    Patient ID: Kayla [name], female    DOB: 1941/05/08, 73 y.o.   MRN: [id number]  HPI She is here for a diabetes recheck. She does have a history of anemia and it was recommended she have an endoscopy however she has not elected to have it done yet. She continues on medications listed in the chart. She does not smoke or drink and is now exercising several times per week through silver sneakers. She very much enjoys that. She does check her feet regularly and has had an eye exam. She has no other concerns or complaints. Her immunizations were reviewed.  Review of Systems     Objective:   Physical Exam Alert and in no distress. Hemoglobin A1c is 6.5.       Assessment & Plan:  Anemia - Plan: CBC with Differential  Diabetic neuropathy, type II diabetes mellitus  Diabetes mellitus - Plan: CBC with Differential, Comprehensive metabolic panel, Lipid panel, HgB A1c, CANCELED: POCT UA - Microalbumin  Hyperlipidemia LDL goal <70 - Plan: Lipid panel  Hypertension associated with diabetes - Plan: CBC with Differential, Comprehensive metabolic panel  Obesity - Plan: CBC with Differential, Comprehensive metabolic panel, Lipid panel  I discussed Zostavax and Pneumovax with her and at this point she is not interested in new one. Recommend she continue with her physical activities as well as her present medication regimen. Recheck here in 4 months.

## 2013-11-24 ENCOUNTER — Other Ambulatory Visit: Payer: Self-pay | Admitting: Family Medicine

## 2013-12-18 LAB — HM DIABETES EYE EXAM

## 2013-12-25 ENCOUNTER — Ambulatory Visit: Payer: Medicare Other | Admitting: Podiatrist

## 2014-01-02 ENCOUNTER — Other Ambulatory Visit: Payer: Self-pay

## 2014-01-02 ENCOUNTER — Encounter: Payer: Self-pay | Admitting: Family Medicine

## 2014-01-02 MED ORDER — METFORMIN HCL 500 MG PO TABS: 500.0000 mg | ORAL_TABLET | Freq: Every day | ORAL | Status: DC

## 2014-01-05 ENCOUNTER — Ambulatory Visit: Payer: Medicare Other | Admitting: Podiatrist

## 2014-01-05 ENCOUNTER — Encounter: Payer: Self-pay | Admitting: Podiatrist

## 2014-01-05 ENCOUNTER — Ambulatory Visit (INDEPENDENT_AMBULATORY_CARE_PROVIDER_SITE_OTHER): Payer: Medicare Other | Admitting: Podiatrist

## 2014-01-05 VITALS — BP 145/72 | HR 69 | Resp 12

## 2014-01-05 DIAGNOSIS — M25579 Pain in unspecified ankle and joints of unspecified foot: Secondary | ICD-10-CM

## 2014-01-05 DIAGNOSIS — M76829 Posterior tibial tendinitis, unspecified leg: Secondary | ICD-10-CM

## 2014-01-05 DIAGNOSIS — B351 Tinea unguium: Secondary | ICD-10-CM

## 2014-01-05 DIAGNOSIS — M79609 Pain in unspecified limb: Secondary | ICD-10-CM

## 2014-01-05 NOTE — Patient Instructions (Addendum)
Try a wrap around elastic ankle brace-- I've seen them at drug stores or [employer]-- it wraps around the foot to the ankle and adhere's to itself with velcro-  I am sending you to a specialized lab to make you a prescription ankle and foot insert-- it should be covered by insurance, but call if you have trouble.    Posterior Tibial Tendon Tendinitis with Rehab Tendonitis is a condition that is characterized by inflammation of a tendon or the lining (sheath) that surrounds it. The inflammation is usually caused by damage to the tendon, such as a tendon tear (strain). Sprains are classified into three categories. Grade 1 sprains cause pain, but the tendon is not lengthened. Grade 2 sprains include a lengthened ligament due to the ligament being stretched or partially ruptured. With grade 2 sprains there is still function, although the function may be diminished. Grade 3 sprains are characterized by a complete tear of the tendon or muscle, and function is usually impaired. Posterior tibialis tendonitis is tendonitis of the posterior tibial tendon, which attaches muscles of the lower leg to the foot. The posterior tibial tendon is located in the back of the ankle and helps the body straighten (plantarflex) and rotate inward (medially rotate) the ankle. SYMPTOMS   Pain, tenderness, swelling, warmth, and/or redness over the back of the inner ankle at the posterior tibial tendon or the inner part of the mid-foot.  Pain that worsens with plantarflexion or medial rotation of the ankle.  A crackling sound (crepitation) when the tendon is moved or touched. CAUSES  Posterior tibial tendonitis occurs when damage to the posterior tibial tendon starts an inflammatory response. Common mechanisms of injury include:  Degenerative (occurs with aging) processes that weaken the tendon and make it more susceptible to injury.  Stress placed on the tendon from an increase in the intensity, frequency, or duration of  training.  Direct trauma to the ankle.  Returning to activity before a previous ankle injury is allowed to heal. RISK INCREASES WITH:  Activities that involve repetitive and/or stressful plantarflexion (jumping, kicking, or running up/down hills).  Poor strength and flexibility.  Flat feet.  Previous injury to the foot, ankle, or leg. PREVENTION   Warm up and stretch properly before activity.  Allow for adequate recovery between workouts.  Maintain physical fitness:  Strength, flexibility, and endurance.  Cardiovascular fitness.  Learn and use proper technique. When possible, have a coach correct improper technique.  Complete rehabilitation from a previous foot, ankle, or leg injury.  If you have flat feet, wear arch supports (orthotics). PROGNOSIS  If treated properly, then the symptoms of tendonitis usually resolve within 6 weeks. This period may be shorter for injuries caused by direct trauma. RELATED COMPLICATIONS   Prolonged healing time, if improperly treated or re-injured.  Recurrent symptoms that result in a chronic problem.  Partial or complete tendon tear (rupture) requiring surgery. TREATMENT  Treatment initially involves the use of ice and medication to help reduce pain and inflammation. The use of strengthening and stretching exercises may help reduce pain with activity. These exercises may be performed at home or with referral to a therapist. Often times, your caregiver will recommend immobilizing the ankle to allow the tendon to heal. If you have flat feet, the you may be advised to wear orthotic arch supports. If symptoms persist for greater than 6 months despite non-surgical (conservative) treatment, then surgery may be recommended. MEDICATION   If pain medication is necessary, then nonsteroidal anti-inflammatory medications, such  as aspirin and ibuprofen, or other minor pain relievers, such as acetaminophen, are often recommended.  Do not take pain  medication for 7 days before surgery.  Prescription pain relievers may be given if deemed necessary by your caregiver. Use only as directed and only as much as you need.  Corticosteroid injections may be given by your caregiver. These injections should be reserved for the most serious cases, because they may only be given a certain number of times. HEAT AND COLD  Cold treatment (icing) relieves pain and reduces inflammation. Cold treatment should be applied for 10 to 15 minutes every 2 to 3 hours for inflammation and pain and immediately after any activity that aggravates your symptoms. Use ice packs or massage the area with a piece of ice (ice massage).  Heat treatment may be used prior to performing the stretching and strengthening activities prescribed by your caregiver, physical therapist, or athletic trainer. Use a heat pack or soak the injury in warm water. SEEK MEDICAL CARE IF:  Treatment seems to offer no benefit, or the condition worsens.  Any medications produce adverse side effects. EXERCISES RANGE OF MOTION (ROM) AND STRETCHING EXERCISES - Posterior Tibial Tendon Tendinitis These exercises may help you when beginning to rehabilitate your injury. Your symptoms may resolve with or without further involvement from your physician, physical therapist or athletic trainer. While completing these exercises, remember:   Restoring tissue flexibility helps normal motion to return to the joints. This allows healthier, less painful movement and activity.  An effective stretch should be held for at least 30 seconds.  A stretch should never be painful. You should only feel a gentle lengthening or release in the stretched tissue. RANGE OF MOTION - Ankle Plantar Flexion   Sit with your right / left leg crossed over your opposite knee.  Use your opposite hand to pull the top of your foot and toes toward you.  You should feel a gentle stretch on the top of your foot/ankle. Hold this position  for __________ seconds. Repeat __________ times. Complete this exercise __________ times per day.  RANGE OF MOTION - Ankle Eversion   Sit with your right / left ankle crossed over your opposite knee.  Grip your foot with your opposite hand, placing your thumb on the top of your foot and your fingers across the bottom of your foot.  Gently push your foot downward with a slight rotation so your littlest toes rise slightly  You should feel a gentle stretch on the inside of your ankle. Hold the stretch for __________ seconds. Repeat __________ times. Complete this exercise __________ times per day.  RANGE OF MOTION - Ankle Inversion   Sit with your right / left ankle crossed over your opposite knee.  Grip your foot with your opposite hand, placing your thumb on the bottom of your foot and your fingers across the top of your foot.  Gently pull your foot so the smallest toe comes toward you and your thumb pushes the inside of the ball of your foot away from you.  You should feel a gentle stretch on the outside of your ankle. Hold the stretch for __________ seconds. Repeat __________ times. Complete this exercise __________ times per day.  RANGE OF MOTION - Dorsi/Plantar Flexion  While sitting with your right / left knee straight, draw the top of your foot upwards by flexing your ankle. Then reverse the motion, pointing your toes downward.  Hold each position for __________ seconds.  After completing your first  set of exercises, repeat this exercise with your knee bent. Repeat __________ times. Complete this exercise __________ times per day.  RANGE OF MOTION - Ankle Alphabet  Imagine your right / left big toe is a pen.  Keeping your hip and knee still, write out the entire alphabet with your "pen." Make the letters as large as you can without increasing any discomfort. Repeat __________ times. Complete this exercise __________ times per day.  STRETCH - Gastrocsoleus   Sit with your  right / left leg extended. Holding onto both ends of a belt or towel, loop it around the ball of your foot.  Keeping your right / left ankle and foot relaxed and your knee straight, pull your foot and ankle toward you using the belt/towel.  You should feel a gentle stretch behind your calf or knee. Hold this position for __________ seconds. Repeat __________ times. Complete this exercise __________ times per day.  STRETCH  Gastroc, Standing   Place hands on wall.  Extend right / left leg, keeping the front knee somewhat bent.  Slightly point your toes inward on your back foot.  Keeping your right / left heel on the floor and your knee straight, shift your weight toward the wall, not allowing your back to arch.  You should feel a gentle stretch in the right / left calf. Hold this position for __________ seconds. Repeat __________ times. Complete this stretch __________ times per day. STRETCH  Soleus, Standing   Place hands on wall.  Extend right / left leg, keeping the other knee somewhat bent.  Slightly point your toes inward on your back foot.  Keep your right / left heel on the floor, bend your back knee, and slightly shift your weight over the back leg so that you feel a gentle stretch deep in your back calf.  Hold this position for __________ seconds. Repeat __________ times. Complete this stretch __________ times per day. STRENGTHENING EXERCISES - Posterior Tibial Tendon Tendinitis These exercises may help you when beginning to rehabilitate your injury. They may resolve your symptoms with or without further involvement from your physician, physical therapist or athletic trainer. While completing these exercises, remember:   Muscles can gain both the endurance and the strength needed for everyday activities through controlled exercises.  Complete these exercises as instructed by your physician, physical therapist or athletic trainer. Progress the resistance and repetitions only  as guided. STRENGTH - Dorsiflexors  Secure a rubber exercise band/tubing to a fixed object (ie. table, pole) and loop the other end around your right / left foot.  Sit on the floor facing the fixed object. The band/tubing should be slightly tense when your foot is relaxed.  Slowly draw your foot back toward you using your ankle and toes.  Hold this position for __________ seconds. Slowly release the tension in the band and return your foot to the starting position. Repeat __________ times. Complete this exercise __________ times per day.  STRENGTH - Towel Curls  Sit in a chair positioned on a non-carpeted surface.  Place your foot on a towel, keeping your heel on the floor.  Pull the towel toward your heel by only curling your toes. Keep your heel on the floor.  If instructed by your physician, physical therapist or athletic trainer, add ____________________ at the end of the towel. Repeat __________ times. Complete this exercise __________ times per day. STRENGTH - Ankle Eversion   Secure one end of a rubber exercise band/tubing to a fixed object (table, pole).  Loop the other end around your foot just before your toes.  Place your fists between your knees. This will focus your strengthening at your ankle.  Drawing the band/tubing across your opposite foot, slowly, pull your little toe out and up. Make sure the band/tubing is positioned to resist the entire motion.  Hold this position for __________ seconds.  Have your muscles resist the band/tubing as it slowly pulls your foot back to the starting position. Repeat __________ times. Complete this exercise __________ times per day.  STRENGTH - Ankle Inversion   Secure one end of a rubber exercise band/tubing to a fixed object (table, pole). Loop the other end around your foot just before your toes.  Place your fists between your knees. This will focus your strengthening at your ankle.  Slowly, pull your big toe up and in, making  sure the band/tubing is positioned to resist the entire motion.  Hold this position for __________ seconds.  Have your muscles resist the band/tubing as it slowly pulls your foot back to the starting position. Repeat __________ times. Complete this exercises __________ times per day.  Document Released: 07/17/2005 Document Revised: 10/09/2011 Document Reviewed: 10/29/2008 [facility] Patient Information 2014 [place], [place].

## 2014-01-05 NOTE — Progress Notes (Signed)
Subjective: Patient presents today for diabetic foot and nail care. She states she's having a significant amount of pain of bilateral great toenails and would like them numbed up before having them trimed. Overall she states she's doing well and her diabetes is in acceptable range. She also relates pain in her arch and ankle region of the left foot.   Objective: Vascular status is intact with palpable pedal pulses at 2/4 DP and 1/4 PT bilateral. Capillary refill time is within normal limits bilateral. Pes planovalgus deformity is present with mild contracture deformity of lesser digits. Bilateral hallux nails are severely incurvated on the medial and lateral nail border and painful with pressure. The remainder of the nails are thickened, discolored, dystrophic, clinically mycotic and uncomfortable.  She recently had an ABI performed 05/16/2013 with ABI on the right of 0.98 an ABI on the left of 0.91. Posterior tibial tendinitis symptomatology is elicited left foot and ankle.  Assessment: Symptomatic mycotic toenails, diabetes with neuropathy, posterior tibial tendinitis left  Plan: Patient's toenails were debrided without complication at today's visit. Bilateral hallux nails were anesthetized per her request with lidocaine and Marcaine mixture without complication. Recommended a ankle-foot orthotics is through [facility] and a prescription was written for her today as well. She will be seen back in 3 months for continued care and if any problems or concerns arise prior that visit she is instructed to call immediately.

## 2014-01-08 ENCOUNTER — Other Ambulatory Visit: Payer: Self-pay | Admitting: Family Medicine

## 2014-01-09 ENCOUNTER — Encounter: Payer: Self-pay | Admitting: Internal Medicine

## 2014-01-17 ENCOUNTER — Other Ambulatory Visit: Payer: Self-pay | Admitting: Family Medicine

## 2014-01-19 NOTE — Telephone Encounter (Signed)
Is this ok to refill?  

## 2014-01-22 NOTE — Telephone Encounter (Signed)
Is this ok to refill?  

## 2014-02-03 ENCOUNTER — Telehealth: Payer: Self-pay | Admitting: Family Medicine

## 2014-02-03 NOTE — Telephone Encounter (Signed)
CALLED PT TO FIND OUT WHY SHE NEEDED THE PLACARD LEFT MESSAGE ON HOME # TO PLEASE CALL us BACK AND LET us KNOW WHY SHE NEEDS THE PLACARD

## 2014-02-03 NOTE — Telephone Encounter (Signed)
Pt dropped off parking placard to be completed. I am sending back in your folder. Please call pt at [phone] when complete.

## 2014-02-03 NOTE — Telephone Encounter (Signed)
If you can find out from her why she needs the handicap placard. I see nothing documented in the record

## 2014-02-04 ENCOUNTER — Other Ambulatory Visit: Payer: Self-pay | Admitting: Family Medicine

## 2014-02-05 NOTE — Telephone Encounter (Signed)
SHE SAID SHE NEEDS IT BECAUSE SHE CANT WALK FAR

## 2014-02-12 ENCOUNTER — Telehealth: Payer: Self-pay | Admitting: *Deleted

## 2014-02-12 NOTE — Telephone Encounter (Signed)
I wrote her a rx for an ankle foot orthosis from [facility]-- if it isn't covered by insurance, there aren't any other options. If there is a question about the prescription itself, the people from [facility] will need to call to clarify the order.    Is her question in this ballpark?  Thanks!

## 2014-02-12 NOTE — Telephone Encounter (Signed)
Pt called and stated that she was calling to follow up with dr egerton regarding [facility]. Please call

## 2014-02-13 NOTE — Telephone Encounter (Signed)
Called patient back and she stated that the cost was $130 for a deposit and was not guaranteed that the insurance would cover it, so she stated that Dr  Valentina Lucks said there was a brace or wrap she could buy at [place] wanted to know what it was called , per dr egerton it is just a ankle support brace not sure of exact name of it was. Patient understood

## 2014-02-16 NOTE — Telephone Encounter (Signed)
02/16/2014 °

## 2014-03-06 ENCOUNTER — Telehealth: Payer: Self-pay

## 2014-03-06 NOTE — Telephone Encounter (Signed)
CALLED AND LEFT MESSAGE FOR [name] TO CALL AND MAKE APPT. FOR HER DIABETES CHECK

## 2014-03-18 ENCOUNTER — Telehealth: Payer: Self-pay | Admitting: *Deleted

## 2014-03-18 ENCOUNTER — Other Ambulatory Visit: Payer: Self-pay | Admitting: Family Medicine

## 2014-03-18 MED ORDER — LOSARTAN POTASSIUM-HCTZ 50-12.5 MG PO TABS: ORAL_TABLET | ORAL | Status: DC

## 2014-03-18 NOTE — Telephone Encounter (Signed)
Pt called and states [facility] advised her we had refused to refill her [place].  I advised pt that we just received refill request today and you have not reviewed it yet.  Pt also has a diabetic followup on 04/01/14

## 2014-03-18 NOTE — Telephone Encounter (Signed)
Pertaining to ankle stabilizer that I had talked to Dr. Valentina Lucks about.  I went by [facility] and they wanted to know if she would approve of a compression stocking?  They need to know my compression level.  I would like to hear from you before the store closes.  Thank You.

## 2014-03-18 NOTE — Addendum Note (Signed)
Addended by: Denita Lung on: 03/18/2014 12:50 PM   Modules accepted: Orders

## 2014-03-19 NOTE — Telephone Encounter (Signed)
A compression stocking is not going to be helpful for her foot pain.  These are only useful for swelling.  If she does want one anyway, she would be a [id number] rx.. Knee high stocking.  Thanks.    She needs to go to [facility] for the AFO that I wrote her for.

## 2014-03-20 NOTE — Telephone Encounter (Signed)
I called and gave her Dr. Shaune Pollack response that compression hose will not help the foot it's basically for swelling.  She stated, [facility] said if I pay $139 it won't guarantee that [facility] will pay for the other $500 dollars and I can't afford that.  I'll talk to her when I come in on April 02, 2014.

## 2014-04-01 ENCOUNTER — Ambulatory Visit (INDEPENDENT_AMBULATORY_CARE_PROVIDER_SITE_OTHER): Payer: Medicare Other | Admitting: Family Medicine

## 2014-04-01 VITALS — BP 118/70 | HR 78 | Wt 220.0 lb

## 2014-04-01 DIAGNOSIS — I152 Hypertension secondary to endocrine disorders: Secondary | ICD-10-CM

## 2014-04-01 DIAGNOSIS — E669 Obesity, unspecified: Secondary | ICD-10-CM

## 2014-04-01 DIAGNOSIS — E1140 Type 2 diabetes mellitus with diabetic neuropathy, unspecified: Secondary | ICD-10-CM

## 2014-04-01 DIAGNOSIS — I10 Essential (primary) hypertension: Secondary | ICD-10-CM

## 2014-04-01 DIAGNOSIS — E785 Hyperlipidemia, unspecified: Secondary | ICD-10-CM

## 2014-04-01 DIAGNOSIS — E1159 Type 2 diabetes mellitus with other circulatory complications: Secondary | ICD-10-CM

## 2014-04-01 DIAGNOSIS — E1149 Type 2 diabetes mellitus with other diabetic neurological complication: Secondary | ICD-10-CM

## 2014-04-01 DIAGNOSIS — E1142 Type 2 diabetes mellitus with diabetic polyneuropathy: Secondary | ICD-10-CM

## 2014-04-01 DIAGNOSIS — E1169 Type 2 diabetes mellitus with other specified complication: Secondary | ICD-10-CM

## 2014-04-01 LAB — POCT GLYCOSYLATED HEMOGLOBIN (HGB A1C): Hemoglobin A1C: 6.4

## 2014-04-01 NOTE — Progress Notes (Signed)
Subjective:    Kayla [name] is a 73 y.o. female who presents for follow-up of Type 2 diabetes mellitus.    Home blood sugar records: TEST 1 TIME A DAY  90 TO 100  Current symptoms/problems include NONE Daily foot checks:   Any foot concerns: YES/ LEFT FOOT PAIN seeing podiatrist Last eye exam:  12/16/2013   Medication compliance:good Current diet: NONE Current exercise: SILVER SNIKERS she very much enjoys this. It also provides some socialization for her.  Known diabetic complications: none Cardiovascular risk factors: advanced age (older than 25 for men, 46 for women), diabetes mellitus, dyslipidemia, hypertension and obesity (BMI >= 30 kg/m2)   The following portions of the patient's history were reviewed and updated as appropriate: allergies, current medications, past family history, past medical history, past social history and problem list.  ROS as in subjective above    Objective:    Wt 220 lb (99.791 kg)   General appearence: alert, no distress, WD/WN Neck: supple, no lymphadenopathy, no thyromegaly, no masses Heart: RRR, normal S1, S2, no murmurs Lungs: CTA bilaterally, no wheezes, rhonchi, or rales Abdomen: +bs, soft, non tender, non distended, no masses, no hepatomegaly, no splenomegaly Pulses: 2+ symmetric, upper and lower extremities, normal cap refill Ext: no edema Foot exam:  Neuro: foot monofilament exam normal   Lab Review Lab Results  Component Value Date   HGBA1C 6.5 11/06/2013   Lab Results  Component Value Date   CHOL 157 11/06/2013   HDL 53 11/06/2013   LDLCALC 92 11/06/2013   TRIG 61 11/06/2013   CHOLHDL 3.0 11/06/2013   No results found for this basenameDerl [name]     Chemistry      Component Value Date/Time   NA 138 11/06/2013 1111   K 4.4 11/06/2013 1111   CL 101 11/06/2013 1111   CO2 28 11/06/2013 1111   BUN 16 11/06/2013 1111   CREATININE 0.72 11/06/2013 1111   CREATININE 0.80 02/04/2011 1621      Component Value Date/Time   CALCIUM  9.1 11/06/2013 1111   ALKPHOS 81 11/06/2013 1111   AST 20 11/06/2013 1111   ALT 16 11/06/2013 1111   BILITOT 0.4 11/06/2013 1111        Chemistry      Component Value Date/Time   NA 138 11/06/2013 1111   K 4.4 11/06/2013 1111   CL 101 11/06/2013 1111   CO2 28 11/06/2013 1111   BUN 16 11/06/2013 1111   CREATININE 0.72 11/06/2013 1111   CREATININE 0.80 02/04/2011 1621      Component Value Date/Time   CALCIUM 9.1 11/06/2013 1111   ALKPHOS 81 11/06/2013 1111   AST 20 11/06/2013 1111   ALT 16 11/06/2013 1111   BILITOT 0.4 11/06/2013 1111      Hemoglobin A1c 6.4  Assessment:   Encounter Diagnoses  Name Primary?  . Type II or unspecified type diabetes mellitus with neurological manifestations, not stated as uncontrolled Yes  . Obesity   . Hypertension associated with diabetes   . Hyperlipidemia LDL goal <70   . Diabetic neuropathy, type II diabetes mellitus            Plan:    1.  Rx changes: none 2.  Education: Reviewed 'ABCs' of diabetes management (respective goals in parentheses):  A1C (<7), blood pressure (<130/80), and cholesterol (LDL <100). 3.  Compliance at present is estimated to be good. Efforts to improve compliance (if necessary) will be directed at increased exercise. 4. Follow up:  4 months

## 2014-04-02 ENCOUNTER — Ambulatory Visit (INDEPENDENT_AMBULATORY_CARE_PROVIDER_SITE_OTHER): Payer: Medicare Other | Admitting: Podiatrist

## 2014-04-02 DIAGNOSIS — M25579 Pain in unspecified ankle and joints of unspecified foot: Secondary | ICD-10-CM

## 2014-04-02 DIAGNOSIS — M76822 Posterior tibial tendinitis, left leg: Secondary | ICD-10-CM

## 2014-04-02 DIAGNOSIS — B351 Tinea unguium: Secondary | ICD-10-CM

## 2014-04-02 DIAGNOSIS — M79676 Pain in unspecified toe(s): Secondary | ICD-10-CM

## 2014-04-02 DIAGNOSIS — M79609 Pain in unspecified limb: Secondary | ICD-10-CM

## 2014-04-02 DIAGNOSIS — M25572 Pain in left ankle and joints of left foot: Secondary | ICD-10-CM

## 2014-04-02 DIAGNOSIS — M76829 Posterior tibial tendinitis, unspecified leg: Secondary | ICD-10-CM

## 2014-04-02 NOTE — Progress Notes (Signed)
  Subjective: Patient presents today for diabetic foot and nail care. She states she's having a significant amount of pain of bilateral great toenails and would like them numbed up before having them trimed. Overall she states she's doing well and her diabetes is in acceptable range. She also relates pain in her arch and ankle region of the left foot which is unchanged as she was not able to get the custom afo from [facility] due to insurance reasons.  Objective: Vascular status is intact with palpable pedal pulses at 2/4 DP and 1/4 PT bilateral. Capillary refill time is within normal limits bilateral. Pes planovalgus deformity is present with mild contracture deformity of lesser digits. Bilateral hallux nails are severely incurvated on the medial and lateral nail border and painful with pressure. The remainder of the nails are thickened, discolored, dystrophic, clinically mycotic and uncomfortable.  She recently had an ABI performed 05/16/2013 with ABI on the right of 0.98 an ABI on the left of 0.91. Posterior tibial tendinitis symptomatology is elicited left foot and ankle.  Assessment: Symptomatic mycotic toenails, diabetes with neuropathy, posterior tibial tendinitis left  Plan: Patient's toenails were debrided without complication at today's visit. Fitted her with an ankle stablilization brace.  At the last visit I recommended a ankle-foot orthotics is through [facility] and a prescription was written however she states her insurance will not cover the braces. She will be seen back in 3 months for continued care and if any problems or concerns arise prior that visit she is instructed to call immediately.

## 2014-04-07 ENCOUNTER — Telehealth: Payer: Self-pay | Admitting: *Deleted

## 2014-04-07 NOTE — Telephone Encounter (Signed)
I returned her call.  She asked if we have anything else we can replace the brace with.  I told her no we don't have anything else to replace it.  I attempted to explain how to use it.  She stated, "I am headed out the door now.  Is there anyway I can come over there and let someone show me how to do it."  I told her yes, just bring it over someone will show you how to do it.  She asked if she needed an appointment.  I told her no appointment is needed.  She stated, "Okay I will stop by."

## 2014-04-07 NOTE — Telephone Encounter (Signed)
Calling to see if Dr. Valentina Lucks is in today.  It's pertaining to the ankle stabilizer that I got there the other day.

## 2014-04-07 NOTE — Telephone Encounter (Signed)
My call is pertaining to that stabilize brace that was given to me.   I can't figure out how to put it on.  I would like to know if there is something else I could use that doesn't have a whole lot of wrapping.  I'll just wait for you all to call me back, thank you.

## 2014-04-14 ENCOUNTER — Telehealth: Payer: Self-pay | Admitting: Family Medicine

## 2014-04-14 NOTE — Telephone Encounter (Signed)
SLEEPY TIME THROAT TAMER IS THE MEDICATION /CHAMOMILE, ELM BARK,LICORICE,GINGER,TILLIAESTRELLA,SLIPPERY ELM, IT IS USED TO HELP HER THROAT SHE HAS LOST HER VOICE

## 2014-04-14 NOTE — Telephone Encounter (Signed)
Find out what is in the medicine.

## 2014-04-14 NOTE — Telephone Encounter (Signed)
It's okay for her to use

## 2014-04-15 NOTE — Telephone Encounter (Signed)
CALLED TO LET HER KNOW THAT IT IS OKAY TO TAKE SUPPLEMENT PER [facility]

## 2014-05-01 ENCOUNTER — Other Ambulatory Visit: Payer: Self-pay | Admitting: Family Medicine

## 2014-05-26 LAB — HM MAMMOGRAPHY

## 2014-06-04 ENCOUNTER — Other Ambulatory Visit: Payer: Self-pay | Admitting: Family Medicine

## 2014-06-04 ENCOUNTER — Other Ambulatory Visit: Payer: Self-pay | Admitting: Medical

## 2014-06-16 ENCOUNTER — Telehealth: Payer: Self-pay | Admitting: Family Medicine

## 2014-06-16 NOTE — Telephone Encounter (Signed)
This is okay.

## 2014-06-16 NOTE — Telephone Encounter (Signed)
Called Kayla [name] to let her it was okay and verbalized understanding

## 2014-06-29 ENCOUNTER — Telehealth: Payer: Self-pay | Admitting: Family Medicine

## 2014-06-29 NOTE — Telephone Encounter (Signed)
Advised pt that per Dr Redmond School she is ok to get cortisone shot in knee

## 2014-07-03 ENCOUNTER — Ambulatory Visit: Payer: Medicare Other | Admitting: Podiatrist

## 2014-07-03 ENCOUNTER — Encounter: Payer: Self-pay | Admitting: Podiatrist

## 2014-07-03 ENCOUNTER — Ambulatory Visit (INDEPENDENT_AMBULATORY_CARE_PROVIDER_SITE_OTHER): Payer: Medicare Other | Admitting: Podiatrist

## 2014-07-03 DIAGNOSIS — B351 Tinea unguium: Secondary | ICD-10-CM

## 2014-07-03 DIAGNOSIS — M79676 Pain in unspecified toe(s): Secondary | ICD-10-CM

## 2014-07-03 NOTE — Progress Notes (Signed)
  Subjective: Patient presents today for diabetic foot and nail care. Overall she states she's doing well and her diabetes is in acceptable range.   Objective: Vascular status is intact with palpable pedal pulses at 2/4 DP and 1/4 PT bilateral. Capillary refill time is within normal limits bilateral. Pes planovalgus deformity is present with mild contracture deformity of lesser digits. Bilateral hallux nails are severely incurvated on the medial and lateral nail border and painful with pressure. The remainder of the nails are thickened, discolored, dystrophic, clinically mycotic and uncomfortable.  She recently had an ABI performed 05/16/2013 with ABI on the right of 0.98 an ABI on the left of 0.91. Posterior tibial tendinitis symptomatology is elicited left foot and ankle.   Assessment: Symptomatic mycotic toenails, diabetes with neuropathy, posterior tibial tendinitis left   Plan: Patient's toenails were debrided without complication at today's visit. She will be seen back in 3 months for continued care and if any problems or concerns arise prior that visit she is instructed to call immediately.

## 2014-07-03 NOTE — Patient Instructions (Signed)
Diabetes and Foot Care Diabetes may cause you to have problems because of poor blood supply (circulation) to your feet and legs. This may cause the skin on your feet to become thinner, break easier, and heal more slowly. Your skin may become dry, and the skin may peel and crack. You may also have nerve damage in your legs and feet causing decreased feeling in them. You may not notice minor injuries to your feet that could lead to infections or more serious problems. Taking care of your feet is one of the most important things you can do for yourself.  HOME CARE INSTRUCTIONS  Wear shoes at all times, even in the house. Do not go barefoot. Bare feet are easily injured.  Check your feet daily for blisters, cuts, and redness. If you cannot see the bottom of your feet, use a mirror or ask someone for help.  Wash your feet with warm water (do not use hot water) and mild soap. Then pat your feet and the areas between your toes until they are completely dry. Do not soak your feet as this can dry your skin.  Apply a moisturizing lotion or petroleum jelly (that does not contain alcohol and is unscented) to the skin on your feet and to dry, brittle toenails. Do not apply lotion between your toes.  Trim your toenails straight across. Do not dig under them or around the cuticle. File the edges of your nails with an emery board or nail file.  Do not cut corns or calluses or try to remove them with medicine.  Wear clean socks or stockings every day. Make sure they are not too tight. Do not wear knee-high stockings since they may decrease blood flow to your legs.  Wear shoes that fit properly and have enough cushioning. To break in new shoes, wear them for just a few hours a day. This prevents you from injuring your feet. Always look in your shoes before you put them on to be sure there are no objects inside.  Do not cross your legs. This may decrease the blood flow to your feet.  If you find a minor scrape,  cut, or break in the skin on your feet, keep it and the skin around it clean and dry. These areas may be cleansed with mild soap and water. Do not cleanse the area with peroxide, alcohol, or iodine.  When you remove an adhesive bandage, be sure not to damage the skin around it.  If you have a wound, look at it several times a day to make sure it is healing.  Do not use heating pads or hot water bottles. They may burn your skin. If you have lost feeling in your feet or legs, you may not know it is happening until it is too late.  Make sure your health care provider performs a complete foot exam at least annually or more often if you have foot problems. Report any cuts, sores, or bruises to your health care provider immediately. SEEK MEDICAL CARE IF:   You have an injury that is not healing.  You have cuts or breaks in the skin.  You have an ingrown nail.  You notice redness on your legs or feet.  You feel burning or tingling in your legs or feet.  You have pain or cramps in your legs and feet.  Your legs or feet are numb.  Your feet always feel cold. SEEK IMMEDIATE MEDICAL CARE IF:   There is increasing redness,   swelling, or pain in or around a wound.  There is a red line that goes up your leg.  Pus is coming from a wound.  You develop a fever or as directed by your health care provider.  You notice a bad smell coming from an ulcer or wound. Document Released: 07/14/2000 Document Revised: 03/19/2013 Document Reviewed: 12/24/2012 Exit[employer] Patient Information 2015 [employer]. This information is not intended to replace advice given to you by your health care provider. Make sure you discuss any questions you have with your health care provider.  

## 2014-07-28 ENCOUNTER — Encounter: Payer: Self-pay | Admitting: Family Medicine

## 2014-07-28 ENCOUNTER — Ambulatory Visit (INDEPENDENT_AMBULATORY_CARE_PROVIDER_SITE_OTHER): Payer: Medicare Other | Admitting: Family Medicine

## 2014-07-28 VITALS — BP 124/84 | HR 80 | Wt 212.0 lb

## 2014-07-28 DIAGNOSIS — M129 Arthropathy, unspecified: Secondary | ICD-10-CM

## 2014-07-28 DIAGNOSIS — M1712 Unilateral primary osteoarthritis, left knee: Secondary | ICD-10-CM

## 2014-07-28 DIAGNOSIS — L309 Dermatitis, unspecified: Secondary | ICD-10-CM

## 2014-07-28 NOTE — Progress Notes (Signed)
   Subjective:    Patient ID: [name], female    DOB: 1940-08-24, 73 y.o.   MRN: [id number]  HPI She is here for consult concerning itching to the right shoulder. No other areas are involved. He also recently had a left knee injection with steroids but states the medication did not last very long. Her orthopedic surgeon was to give her a series of 3 injections and she wants my input into this. He apparently told her that surgery was not an option at this time.   Review of Systems     Objective:   Physical Exam Alert and in no distress. Exam of the right shoulder shows minimal drying effect over the deltoid area. Knee exam not done.       Assessment & Plan:  Dermatitis  Arthritis of left knee  recommend cortisone cream and cool compresses for the shoulder. Also discussed the arthritis symptoms she is having. Encouraged her to go ahead and get the series of 3 injections to see if this would help. Hip no improvement then I think she should consider a knee replacement since she has had her right one replaced.

## 2014-07-28 NOTE — Patient Instructions (Signed)
Use cortisone cream on your shoulder and a cold washcloth will help the itching as well as. Give your orthopedic surgeon a call concerning further injections.

## 2014-08-12 ENCOUNTER — Telehealth: Payer: Self-pay | Admitting: Internal Medicine

## 2014-08-12 NOTE — Telephone Encounter (Signed)
Pt informed and verbalized understanding

## 2014-08-12 NOTE — Telephone Encounter (Signed)
Pt called stating if she can take tylnelol 8 hour 650mg  for her arthitis. Pt is going out of town and wants to know this before she leaves. Please advise pt

## 2014-08-12 NOTE — Telephone Encounter (Signed)
This is okay.

## 2014-08-25 ENCOUNTER — Ambulatory Visit: Payer: Medicare Other | Admitting: Family Medicine

## 2014-08-27 ENCOUNTER — Other Ambulatory Visit: Payer: Self-pay | Admitting: Family Medicine

## 2014-08-31 ENCOUNTER — Ambulatory Visit (INDEPENDENT_AMBULATORY_CARE_PROVIDER_SITE_OTHER): Payer: Medicare Other | Admitting: Family Medicine

## 2014-08-31 ENCOUNTER — Encounter: Payer: Self-pay | Admitting: Family Medicine

## 2014-08-31 VITALS — BP 122/72 | HR 90 | Wt 222.0 lb

## 2014-08-31 DIAGNOSIS — I152 Hypertension secondary to endocrine disorders: Secondary | ICD-10-CM

## 2014-08-31 DIAGNOSIS — M25511 Pain in right shoulder: Secondary | ICD-10-CM

## 2014-08-31 DIAGNOSIS — E669 Obesity, unspecified: Secondary | ICD-10-CM

## 2014-08-31 DIAGNOSIS — E1159 Type 2 diabetes mellitus with other circulatory complications: Secondary | ICD-10-CM

## 2014-08-31 DIAGNOSIS — I10 Essential (primary) hypertension: Secondary | ICD-10-CM

## 2014-08-31 DIAGNOSIS — E785 Hyperlipidemia, unspecified: Secondary | ICD-10-CM

## 2014-08-31 DIAGNOSIS — E119 Type 2 diabetes mellitus without complications: Secondary | ICD-10-CM

## 2014-08-31 DIAGNOSIS — E1169 Type 2 diabetes mellitus with other specified complication: Secondary | ICD-10-CM

## 2014-08-31 LAB — POCT GLYCOSYLATED HEMOGLOBIN (HGB A1C): Hemoglobin A1C: 6.6

## 2014-08-31 MED ORDER — TRIAMCINOLONE ACETONIDE 40 MG/ML IJ SUSP
40.0000 mg | Freq: Once | INTRAMUSCULAR | Status: AC
  Administered 2014-08-31: 40 mg via INTRAMUSCULAR

## 2014-08-31 NOTE — Addendum Note (Signed)
Addended by: Minette Headland A on: 08/31/2014 03:37 PM   Modules accepted: Orders

## 2014-08-31 NOTE — Progress Notes (Signed)
   Subjective:    Patient ID: Kayla [name], female    DOB: 1941-07-21, 74 y.o.   MRN: [id number]  HPI She is here for follow-up on her diabetes. Her ankle troubles keep her from being as active as she would like to be. She is in the process of getting a brace for her left ankle. She has seen podiatry for this. She continues on medications listed in the chart. Her blood sugars run in the low 100s. Her diet is unchanged. Smoking and drinking were reviewed. She does complain of a two-week history of right shoulder pain. No history of injury to the shoulder. No popping or grinding. No numbness, tingling or weakness in the arm.   Review of Systems     Objective:   Physical Exam Alert and in no distress. Full motion of the shoulder. Drop arm test was uncomfortable. Supraspinatus testing negative. Neer's and Hawkins tests were uncomfortable. Hemoglobin A1c is 6.6      Assessment & Plan:  Obesity  Hypertension associated with diabetes  Hyperlipidemia LDL goal <70  Type 2 diabetes mellitus without complication  Right shoulder pain  she will continue on her present medication regimen. Discussed treatment of the shoulder and have elected to inject the shoulder. The posterior lateral aspect of the right shoulder was prepped with Betadine. 40 mg of Kenalog and 3 mL of Xylocaine was injected into the subacromial bursa without difficulty. She obtained relief of her symptoms relatively quickly and was quite happy with this. Recheck here in 4 months.

## 2014-08-31 NOTE — Addendum Note (Signed)
Addended by: Minette Headland A on: 08/31/2014 02:16 PM   Modules accepted: Orders

## 2014-09-01 ENCOUNTER — Ambulatory Visit: Payer: Self-pay | Admitting: Family Medicine

## 2014-10-02 ENCOUNTER — Encounter: Payer: Self-pay | Admitting: Podiatrist

## 2014-10-02 ENCOUNTER — Ambulatory Visit (INDEPENDENT_AMBULATORY_CARE_PROVIDER_SITE_OTHER): Payer: Medicare Other | Admitting: Podiatrist

## 2014-10-02 DIAGNOSIS — M79676 Pain in unspecified toe(s): Secondary | ICD-10-CM | POA: Diagnosis not present

## 2014-10-02 DIAGNOSIS — E1140 Type 2 diabetes mellitus with diabetic neuropathy, unspecified: Secondary | ICD-10-CM

## 2014-10-02 DIAGNOSIS — B351 Tinea unguium: Secondary | ICD-10-CM | POA: Diagnosis not present

## 2014-10-02 DIAGNOSIS — Q828 Other specified congenital malformations of skin: Secondary | ICD-10-CM

## 2014-10-02 DIAGNOSIS — Q667 Congenital pes cavus: Secondary | ICD-10-CM

## 2014-10-02 DIAGNOSIS — M216X9 Other acquired deformities of unspecified foot: Secondary | ICD-10-CM

## 2014-10-02 NOTE — Progress Notes (Signed)
  Subjective: Patient presents today for diabetic foot and nail care. Overall she states she's doing well and her diabetes is in acceptable range.   Objective: Vascular status is intact with palpable pedal pulses at 2/4 DP and 1/4 PT bilateral. Capillary refill time is within normal limits bilateral. Pes planovalgus deformity is present with mild contracture deformity of lesser digits. Bilateral hallux nails are severely incurvated on the medial and lateral nail border and painful with pressure. The remainder of the nails are thickened, discolored, dystrophic, clinically mycotic and uncomfortable.  She recently had an ABI performed 05/16/2013 with ABI on the right of 0.98 an ABI on the left of 0.91. Posterior tibial tendinitis symptomatology is elicited left foot and ankle of which she is now wearing a afo which she has had for 2 days.  Large plaque like hyperkeratotic lesions are present bilateral heels which are symptomatic   Assessment: Symptomatic mycotic toenails, diabetes with neuropathy, keratotic lesions x 2  Plan: Patient's toenails and keratotic lesions were debrided without complication at today's visit. She will be seen back in 3 months for continued care and if any problems or concerns arise prior that visit she is instructed to call immediately.

## 2014-10-02 NOTE — Patient Instructions (Signed)
Diabetes and Foot Care Diabetes may cause you to have problems because of poor blood supply (circulation) to your feet and legs. This may cause the skin on your feet to become thinner, break easier, and heal more slowly. Your skin may become dry, and the skin may peel and crack. You may also have nerve damage in your legs and feet causing decreased feeling in them. You may not notice minor injuries to your feet that could lead to infections or more serious problems. Taking care of your feet is one of the most important things you can do for yourself.  HOME CARE INSTRUCTIONS  Wear shoes at all times, even in the house. Do not go barefoot. Bare feet are easily injured.  Check your feet daily for blisters, cuts, and redness. If you cannot see the bottom of your feet, use a mirror or ask someone for help.  Wash your feet with warm water (do not use hot water) and mild soap. Then pat your feet and the areas between your toes until they are completely dry. Do not soak your feet as this can dry your skin.  Apply a moisturizing lotion or petroleum jelly (that does not contain alcohol and is unscented) to the skin on your feet and to dry, brittle toenails. Do not apply lotion between your toes.  Trim your toenails straight across. Do not dig under them or around the cuticle. File the edges of your nails with an emery board or nail file.  Do not cut corns or calluses or try to remove them with medicine.  Wear clean socks or stockings every day. Make sure they are not too tight. Do not wear knee-high stockings since they may decrease blood flow to your legs.  Wear shoes that fit properly and have enough cushioning. To break in new shoes, wear them for just a few hours a day. This prevents you from injuring your feet. Always look in your shoes before you put them on to be sure there are no objects inside.  Do not cross your legs. This may decrease the blood flow to your feet.  If you find a minor scrape,  cut, or break in the skin on your feet, keep it and the skin around it clean and dry. These areas may be cleansed with mild soap and water. Do not cleanse the area with peroxide, alcohol, or iodine.  When you remove an adhesive bandage, be sure not to damage the skin around it.  If you have a wound, look at it several times a day to make sure it is healing.  Do not use heating pads or hot water bottles. They may burn your skin. If you have lost feeling in your feet or legs, you may not know it is happening until it is too late.  Make sure your health care provider performs a complete foot exam at least annually or more often if you have foot problems. Report any cuts, sores, or bruises to your health care provider immediately. SEEK MEDICAL CARE IF:   You have an injury that is not healing.  You have cuts or breaks in the skin.  You have an ingrown nail.  You notice redness on your legs or feet.  You feel burning or tingling in your legs or feet.  You have pain or cramps in your legs and feet.  Your legs or feet are numb.  Your feet always feel cold. SEEK IMMEDIATE MEDICAL CARE IF:   There is increasing redness,   swelling, or pain in or around a wound.  There is a red line that goes up your leg.  Pus is coming from a wound.  You develop a fever or as directed by your health care provider.  You notice a bad smell coming from an ulcer or wound. Document Released: 07/14/2000 Document Revised: 03/19/2013 Document Reviewed: 12/24/2012 Exit[employer] Patient Information 2015 [employer]. This information is not intended to replace advice given to you by your health care provider. Make sure you discuss any questions you have with your health care provider.  

## 2014-10-15 ENCOUNTER — Other Ambulatory Visit: Payer: Self-pay | Admitting: Family Medicine

## 2014-11-03 ENCOUNTER — Encounter: Payer: Self-pay | Admitting: Family Medicine

## 2014-11-03 ENCOUNTER — Ambulatory Visit (INDEPENDENT_AMBULATORY_CARE_PROVIDER_SITE_OTHER): Payer: Medicare Other | Admitting: Family Medicine

## 2014-11-03 VITALS — BP 120/78 | HR 76 | Wt 214.0 lb

## 2014-11-03 DIAGNOSIS — M545 Low back pain, unspecified: Secondary | ICD-10-CM

## 2014-11-03 DIAGNOSIS — M25552 Pain in left hip: Secondary | ICD-10-CM | POA: Diagnosis not present

## 2014-11-03 DIAGNOSIS — M25562 Pain in left knee: Secondary | ICD-10-CM

## 2014-11-03 DIAGNOSIS — E669 Obesity, unspecified: Secondary | ICD-10-CM

## 2014-11-03 NOTE — Progress Notes (Signed)
   Subjective:    Patient ID: Kayla [name], female    DOB: 07/27/1941, 74 y.o.   MRN: [id number]  HPI She is here for evaluation of a three-week history of low back pain. It has gotten worse in the last 2 weeks. The pain is made worse with extension. No history of injury, numbness, tingling or weakness. She also has a history of left knee pain and did have an injection in the recent past however she says the injection has not helped. He saw Dr. French Ana for this.   Review of Systems     Objective:   Physical Exam Exam of the low back shows no tenderness with normal lumbar curve however extension did cause some discomfort. Flexion was less painful. Negative straight leg raising. There was some pain on hip motion. No tenderness over SI joints. Flexes difficult to do.       Assessment & Plan:  Obesity  Left hip pain - Plan: DG HIP UNILAT WITH PELVIS 1V LEFT  Left-sided low back pain without sciatica - Plan: DG Lumbar Spine Complete  Left knee pain Recommend she call Dr. French Ana back again and discuss injection again. Further evaluation pending x-rays. Also recommend 2 Aleve twice per day.

## 2014-11-04 ENCOUNTER — Ambulatory Visit
Admission: RE | Admit: 2014-11-04 | Discharge: 2014-11-04 | Disposition: A | Discharge status: Home / Self Care | Payer: Medicare Other | Source: Ambulatory Visit | Attending: Family Medicine | Admitting: Family Medicine

## 2014-11-04 ENCOUNTER — Other Ambulatory Visit: Payer: Self-pay | Admitting: Family Medicine

## 2014-11-04 DIAGNOSIS — M545 Low back pain, unspecified: Secondary | ICD-10-CM

## 2014-11-04 DIAGNOSIS — M25552 Pain in left hip: Secondary | ICD-10-CM

## 2014-11-09 ENCOUNTER — Telehealth: Payer: Self-pay

## 2014-11-09 NOTE — Telephone Encounter (Signed)
Patient has been informed.

## 2014-11-09 NOTE — Telephone Encounter (Signed)
This is okay.

## 2014-11-09 NOTE — Telephone Encounter (Signed)
Patients eye Dr wanted to make sure it was okay for patient to take claritin d please advise

## 2014-11-10 ENCOUNTER — Telehealth: Payer: Self-pay | Admitting: Family Medicine

## 2014-11-10 NOTE — Telephone Encounter (Signed)
Tell her I am okay with this

## 2014-11-10 NOTE — Telephone Encounter (Signed)
Pt called and stated that Dr. French Ana wants her to stop alieve and start taking mobic. He did send that in already. She just wants to make sure it okay with you. Please call pt at [phone].

## 2014-11-10 NOTE — Telephone Encounter (Signed)
Patient informed and verbalized

## 2014-11-12 ENCOUNTER — Other Ambulatory Visit: Payer: Self-pay | Admitting: Family Medicine

## 2014-11-17 ENCOUNTER — Ambulatory Visit (INDEPENDENT_AMBULATORY_CARE_PROVIDER_SITE_OTHER): Payer: Medicare Other | Admitting: Family Medicine

## 2014-11-17 ENCOUNTER — Encounter: Payer: Self-pay | Admitting: Family Medicine

## 2014-11-17 VITALS — BP 122/82 | HR 90 | Temp 97.8°F | Wt 219.0 lb

## 2014-11-17 DIAGNOSIS — J0140 Acute pansinusitis, unspecified: Secondary | ICD-10-CM | POA: Diagnosis not present

## 2014-11-17 MED ORDER — AMOXICILLIN 875 MG PO TABS: 875.0000 mg | ORAL_TABLET | Freq: Two times a day (BID) | ORAL | Status: DC

## 2014-11-17 NOTE — Patient Instructions (Signed)
You can use NyQuil at night to help with the coughing. Take all the antibiotic and if you are not totally back to normal when you finish give me a call

## 2014-11-17 NOTE — Progress Notes (Signed)
   Subjective:    Patient ID: Kayla [name], female    DOB: April 21, 1941, 74 y.o.   MRN: [id number]  HPI She has a one-week history of sinus congestion with purulent PND and slight throat discomfort with coughing,hoarse voice. No nasal congestion fever, chills or earache. Cough is nonproductive. She does not smoke and she continues on her diabetes medications.  Review of Systems     Objective:   Physical Exam Alert and in no distress. Tympanic membranes and canals are normal. Pharyngeal area is normal. Neck is supple without adenopathy or thyromegaly. Cardiac exam shows a regular sinus rhythm without murmurs or gallops. Lungs are clear to auscultation.nontneder sinuses with normal mucosa.       Assessment & Plan:  Acute pansinusitis, recurrence not specified - Plan: amoxicillin (AMOXIL) 875 MG tablet

## 2014-12-07 ENCOUNTER — Ambulatory Visit: Payer: Medicare Other | Admitting: Family Medicine

## 2014-12-07 ENCOUNTER — Encounter: Payer: Self-pay | Admitting: Family Medicine

## 2014-12-07 ENCOUNTER — Ambulatory Visit (INDEPENDENT_AMBULATORY_CARE_PROVIDER_SITE_OTHER): Payer: Medicare Other | Admitting: Family Medicine

## 2014-12-07 VITALS — BP 130/62 | HR 80 | Wt 221.6 lb

## 2014-12-07 DIAGNOSIS — R296 Repeated falls: Secondary | ICD-10-CM | POA: Diagnosis not present

## 2014-12-07 DIAGNOSIS — S40021A Contusion of right upper arm, initial encounter: Secondary | ICD-10-CM | POA: Diagnosis not present

## 2014-12-07 NOTE — Patient Instructions (Addendum)
Start Back exercising and if you need to go do the water Heat to your right arm 20 minutes 3 times per day. Tylenol for pain.

## 2014-12-07 NOTE — Progress Notes (Signed)
   Subjective:    Patient ID: Kayla [name], female    DOB: 1941/05/22, 74 y.o.   MRN: [id number]  HPI She fell one week ago injuring her right arm. Initially after the fall she had no pain but then noted some mid right arm discomfort and some slight swelling and discoloration. In the past she has been involved in an exercise program but was having knee trouble and stopped. She states that she does have her house with railings etc.   Review of Systems     Objective:   Physical Exam Good motion of the shoulder and no palpable tenderness. An abrasion with surrounding erythema is noted to the right mid posterior arm area. No palpable bony tenderness.       Assessment & Plan:  Multiple falls  Arm contusion, right, initial encounter  Discussed this falls with her in detail. Discussed nightlights, proper bathroom fixtures, rugs etc. Discussed having a home health agency come by however she declined. Strongly encouraged her to get back involved with a vigorous activity program.

## 2014-12-31 LAB — HM DIABETES EYE EXAM

## 2015-01-01 ENCOUNTER — Ambulatory Visit: Payer: Medicare Other

## 2015-01-04 ENCOUNTER — Ambulatory Visit (INDEPENDENT_AMBULATORY_CARE_PROVIDER_SITE_OTHER): Payer: Medicare Other | Admitting: Family Medicine

## 2015-01-04 ENCOUNTER — Encounter: Payer: Self-pay | Admitting: Family Medicine

## 2015-01-04 VITALS — BP 122/80 | HR 79 | Ht 67.0 in | Wt 226.0 lb

## 2015-01-04 DIAGNOSIS — I10 Essential (primary) hypertension: Secondary | ICD-10-CM | POA: Diagnosis not present

## 2015-01-04 DIAGNOSIS — I152 Hypertension secondary to endocrine disorders: Secondary | ICD-10-CM

## 2015-01-04 DIAGNOSIS — E669 Obesity, unspecified: Secondary | ICD-10-CM

## 2015-01-04 DIAGNOSIS — E1159 Type 2 diabetes mellitus with other circulatory complications: Secondary | ICD-10-CM

## 2015-01-04 DIAGNOSIS — R531 Weakness: Secondary | ICD-10-CM | POA: Diagnosis not present

## 2015-01-04 DIAGNOSIS — E1169 Type 2 diabetes mellitus with other specified complication: Secondary | ICD-10-CM | POA: Diagnosis not present

## 2015-01-04 DIAGNOSIS — E119 Type 2 diabetes mellitus without complications: Secondary | ICD-10-CM

## 2015-01-04 DIAGNOSIS — Z23 Encounter for immunization: Secondary | ICD-10-CM

## 2015-01-04 DIAGNOSIS — E785 Hyperlipidemia, unspecified: Secondary | ICD-10-CM | POA: Diagnosis not present

## 2015-01-04 DIAGNOSIS — S40021A Contusion of right upper arm, initial encounter: Secondary | ICD-10-CM

## 2015-01-04 LAB — CBC WITH DIFFERENTIAL/PLATELET
Basophils Absolute: 0.1 10*3/uL (ref 0.0–0.1)
Basophils Relative: 1 % (ref 0–1)
Eosinophils Absolute: 0.2 10*3/uL (ref 0.0–0.7)
Eosinophils Relative: 3 % (ref 0–5)
HCT: 31.8 % — ABNORMAL LOW (ref 36.0–46.0)
Hemoglobin: 10.1 g/dL — ABNORMAL LOW (ref 12.0–15.0)
Lymphocytes Relative: 29 % (ref 12–46)
Lymphs Abs: 1.6 10*3/uL (ref 0.7–4.0)
MCH: 26.4 pg (ref 26.0–34.0)
MCHC: 31.8 g/dL (ref 30.0–36.0)
MCV: 83 fL (ref 78.0–100.0)
MPV: 10.1 fL (ref 8.6–12.4)
Monocytes Absolute: 0.5 10*3/uL (ref 0.1–1.0)
Monocytes Relative: 9 % (ref 3–12)
Neutro Abs: 3.2 10*3/uL (ref 1.7–7.7)
Neutrophils Relative %: 58 % (ref 43–77)
Platelets: 279 10*3/uL (ref 150–400)
RBC: 3.83 MIL/uL — ABNORMAL LOW (ref 3.87–5.11)
RDW: 14.7 % (ref 11.5–15.5)
WBC: 5.5 10*3/uL (ref 4.0–10.5)

## 2015-01-04 LAB — LIPID PANEL
Cholesterol: 161 mg/dL (ref 0–200)
HDL: 53 mg/dL (ref >=46)
LDL Cholesterol: 95 mg/dL (ref 0–99)
Total CHOL/HDL Ratio: 3 Ratio
Triglycerides: 65 mg/dL (ref <150)
VLDL: 13 mg/dL (ref 0–40)

## 2015-01-04 LAB — COMPREHENSIVE METABOLIC PANEL
ALT: 15 U/L (ref 0–35)
AST: 17 U/L (ref 0–37)
Albumin: 3.7 g/dL (ref 3.5–5.2)
Alkaline Phosphatase: 85 U/L (ref 39–117)
BUN: 19 mg/dL (ref 6–23)
CO2: 27 mEq/L (ref 19–32)
Calcium: 9.2 mg/dL (ref 8.4–10.5)
Chloride: 102 mEq/L (ref 96–112)
Creat: 0.73 mg/dL (ref 0.50–1.10)
Glucose, Bld: 97 mg/dL (ref 70–99)
Potassium: 4.1 mEq/L (ref 3.5–5.3)
Sodium: 137 mEq/L (ref 135–145)
Total Bilirubin: 0.3 mg/dL (ref 0.2–1.2)
Total Protein: 6.7 g/dL (ref 6.0–8.3)

## 2015-01-04 LAB — TSH: TSH: 1.413 u[IU]/mL (ref 0.350–4.500)

## 2015-01-04 LAB — POCT GLYCOSYLATED HEMOGLOBIN (HGB A1C): Hemoglobin A1C: 6.4

## 2015-01-04 NOTE — Progress Notes (Signed)
  Subjective:    Patient ID: Kayla [name], female    DOB: [birth year], 74 y.o.   MRN: [id number]  Kayla [name] is a 74 y.o. female who presents for follow-up of Type 2 diabetes mellitus. She also complains of feeling weak and cold. No skin changes, hair changes or mental issues.Her right shoulder is causing her less difficulty than in the past.  Home blood sugar records: Patient test one time a day Current symptoms/problems NONE Daily foot checks: yes  Any foot concerns: She does have flatfeet and lateral deviation. [facility]. Exercise: none Although she does plan to go to [employer] Eyes:12/2013 The following portions of the patient's history were reviewed and updated as appropriate: allergies, current medications, past medical history, past social history and problem list.  ROS as in subjective above.     Objective:    Physical Exam Alert and in no distress Skin appears normal. Cardiac exam shows regular rhythm without murmurs or rales. Lungs are clear to auscultation.Foot exam shows normal pulses and sensations.  Lab Review Diabetic Labs Latest Ref Rng 08/31/2014 04/01/2014 11/06/2013 05/08/2013 09/09/2012  HbA1c - 6.6% 6.4 6.5 6.3 6.3  Chol 0 - 200 mg/dL - - 157 - 259(H)  HDL >39 mg/dL - - 53 - 56  Calc LDL 0 - 99 mg/dL - - 92 - 189(H)  Triglycerides <150 mg/dL - - 61 - 72  Creatinine 0.50 - 1.10 mg/dL - - 0.72 - 0.81   BP/Weight 12/07/2014 11/17/2014 11/03/2014 08/31/2014 60/63/0160  Systolic BP 109 323 557 322 025  Diastolic BP 62 82 78 72 84  Wt. (Lbs) 221.6 219 214 222 212  BMI 34.7 34.29 33.51 34.76 33.2   Foot/eye exam completion dates Latest Ref Rng 12/18/2013  Eye Exam No Retinopathy No Retinopathy  Foot Form Completion - -  Hemoglobin A1c is 6.4  [place]  reports that she has never smoked. She has never used smokeless tobacco. She reports that she does not drink alcohol or use illicit drugs.     Assessment & Plan:  Need for prophylactic  vaccination against Streptococcus pneumoniae (pneumococcus) - Plan: Pneumococcal conjugate vaccine 13-valent  Arm contusion, right, initial encounter  Obesity  Hypertension associated with diabetes - Plan: CBC with Differential/Platelet, Comprehensive metabolic panel  Hyperlipidemia associated with type 2 diabetes mellitus - Plan: Lipid panel  Type 2 diabetes mellitus without complication - Plan: POCT glycosylated hemoglobin (Hb A1C), POCT UA - Microalbumin, CBC with Differential/Platelet, Comprehensive metabolic panel, Lipid panel  Weakness - Plan: TSH   1. Rx changes: none 2. Education: Reviewed 'ABCs' of diabetes management (respective goals in parentheses):  A1C (<7), blood pressure (<130/80), and cholesterol (LDL <100). 3. Compliance at present is estimated to be good. Efforts to improve compliance (if necessary) will be directed at increased exercise. 4. Follow up: 4 months

## 2015-01-05 MED ORDER — LOSARTAN POTASSIUM-HCTZ 50-12.5 MG PO TABS: ORAL_TABLET | ORAL | Status: DC

## 2015-01-05 MED ORDER — METFORMIN HCL 500 MG PO TABS: ORAL_TABLET | ORAL | Status: DC

## 2015-01-05 MED ORDER — ATORVASTATIN CALCIUM 80 MG PO TABS: ORAL_TABLET | ORAL | Status: DC

## 2015-01-05 MED ORDER — CARVEDILOL 3.125 MG PO TABS: ORAL_TABLET | ORAL | Status: DC

## 2015-01-05 MED ORDER — POLYSACCHARIDE IRON COMPLEX 150 MG PO CAPS: 150.0000 mg | ORAL_CAPSULE | Freq: Two times a day (BID) | ORAL | Status: DC

## 2015-01-05 NOTE — Addendum Note (Signed)
Addended by: Denita Lung on: 01/05/2015 11:52 AM   Modules accepted: Orders

## 2015-01-07 ENCOUNTER — Encounter: Payer: Self-pay | Admitting: Podiatry

## 2015-01-07 ENCOUNTER — Ambulatory Visit (INDEPENDENT_AMBULATORY_CARE_PROVIDER_SITE_OTHER): Payer: Medicare Other | Admitting: Podiatry

## 2015-01-07 DIAGNOSIS — B351 Tinea unguium: Secondary | ICD-10-CM

## 2015-01-07 DIAGNOSIS — E1140 Type 2 diabetes mellitus with diabetic neuropathy, unspecified: Secondary | ICD-10-CM | POA: Diagnosis not present

## 2015-01-07 DIAGNOSIS — M79676 Pain in unspecified toe(s): Secondary | ICD-10-CM

## 2015-01-07 NOTE — Progress Notes (Signed)
Patient ID: Kayla [name], female   DOB: 05-Mar-1941, 74 y.o.   MRN: [id number] Complaint:  Visit Type: Patient returns to my office for continued preventative foot care services. Complaint: Patient states" my nails have grown long and thick and become painful to walk and wear shoes" Patient has been diagnosed with DM with neuropathy.[name] He presents for preventative foot care services. No changes to ROS,  Heel calluses noted.  Podiatric Exam: Vascular: dorsalis pedis and posterior tibial pulses are palpable bilateral. Capillary return is immediate. Temperature gradient is WNL. Skin turgor WNL  Sensorium: Normal Semmes Weinstein monofilament test. Normal tactile sensation bilaterally. Nail Exam: Pt has thick disfigured discolored nails with subungual debris noted bilateral entire nail hallux through fifth toenails Ulcer Exam: There is no evidence of ulcer or pre-ulcerative changes or infection. Orthopedic Exam: Muscle tone and strength are WNL. No limitations in general ROM. No crepitus or effusions noted. Foot type and digits show no abnormalities. Bony prominences are unremarkable. Skin: No Porokeratosis. No infection or ulcers.  Heel calluses B/L.  Diagnosis:  Tinea unguium, Pain in right toe, pain in left toes  Treatment & Plan Procedures and Treatment: Consent by patient was obtained for treatment procedures. The patient understood the discussion of treatment and procedures well. All questions were answered thoroughly reviewed. Debridement of mycotic and hypertrophic toenails, 1 through 5 bilateral and clearing of subungual debris. No ulceration, no infection noted.  Return Visit-Office Procedure: Patient instructed to return to the office for a follow up visit 3 months for continued evaluation and treatment.

## 2015-01-12 ENCOUNTER — Other Ambulatory Visit: Payer: Self-pay | Admitting: Family Medicine

## 2015-01-13 ENCOUNTER — Encounter: Payer: Self-pay | Admitting: Internal Medicine

## 2015-02-02 ENCOUNTER — Ambulatory Visit (INDEPENDENT_AMBULATORY_CARE_PROVIDER_SITE_OTHER): Payer: Medicare Other | Admitting: Family Medicine

## 2015-02-02 ENCOUNTER — Encounter: Payer: Self-pay | Admitting: Family Medicine

## 2015-02-02 VITALS — BP 112/80 | HR 90 | Wt 223.0 lb

## 2015-02-02 DIAGNOSIS — H748X2 Other specified disorders of left middle ear and mastoid: Secondary | ICD-10-CM

## 2015-02-02 NOTE — Progress Notes (Signed)
   Subjective:    Patient ID: Kayla [name], female    DOB: 26-Oct-1940, 74 y.o.   MRN: [id number]  HPI She injured her left ear drum a few days ago when she had a sensation of itching and place an instrument in her ear canal further than she should have. She is experiencing some fullness and ringing in her ears.   Review of Systems     Objective:   Physical Exam The left ear canal is normal. The tympanic membrane does show evidence of bleeding with a questionable perforation in the superior posterior aspect of the TM.       Assessment & Plan:  Hemotympanum, left I reassured her that this should heal up on its own however I will recheck this in 10 days. Encouraged her to never put any instruments inside the ear. She can use cortisone cream on external canal where is itching which should help.

## 2015-02-15 ENCOUNTER — Ambulatory Visit (INDEPENDENT_AMBULATORY_CARE_PROVIDER_SITE_OTHER): Payer: Medicare Other | Admitting: Family Medicine

## 2015-02-15 ENCOUNTER — Encounter: Payer: Self-pay | Admitting: Family Medicine

## 2015-02-15 VITALS — BP 116/74 | HR 84 | Wt 226.0 lb

## 2015-02-15 DIAGNOSIS — D638 Anemia in other chronic diseases classified elsewhere: Secondary | ICD-10-CM

## 2015-02-15 DIAGNOSIS — M545 Low back pain, unspecified: Secondary | ICD-10-CM

## 2015-02-15 DIAGNOSIS — I152 Hypertension secondary to endocrine disorders: Secondary | ICD-10-CM

## 2015-02-15 DIAGNOSIS — E1169 Type 2 diabetes mellitus with other specified complication: Secondary | ICD-10-CM

## 2015-02-15 DIAGNOSIS — E1159 Type 2 diabetes mellitus with other circulatory complications: Secondary | ICD-10-CM

## 2015-02-15 DIAGNOSIS — I10 Essential (primary) hypertension: Secondary | ICD-10-CM

## 2015-02-15 DIAGNOSIS — E669 Obesity, unspecified: Secondary | ICD-10-CM | POA: Diagnosis not present

## 2015-02-15 DIAGNOSIS — H748X2 Other specified disorders of left middle ear and mastoid: Secondary | ICD-10-CM | POA: Diagnosis not present

## 2015-02-15 DIAGNOSIS — R296 Repeated falls: Secondary | ICD-10-CM | POA: Diagnosis not present

## 2015-02-15 NOTE — Progress Notes (Signed)
   Subjective:    Patient ID: Kayla [name], female    DOB: Jun 26, 1941, 74 y.o.   MRN: [id number]  HPI She is here for follow-up on her tympanic membrane. She still is having some slight difficulty with that. She has limited her physical activity due to having recent falls and still complaining of some mild low back pain but no specific area. She has had 2 falls recently which have contributed to her back pain. He was seen recently by her gastroenterologist and told to stop iron as it has not been doing any good. I do not have that report. Her weight is unchanged. Physical activity level is unchanged. She does not complain of any cognitive or depression symptoms.   Review of Systems     Objective:   Physical Exam Alert and in no distress. Exam of the left tympanogram remained does show dried blood in the superior posterior aspect of the TM. Left TM is normal. No tenderness palpation of the lumbar spine or paravertebral muscles.       Assessment & Plan:  Hemotympanum, left  Obesity  Hypertension associated with diabetes  Multiple falls  Anemia of chronic illness  Midline low back pain without sciatica  will stop taking the iron. I will follow-up with blood work on this in several months. Continue on her present medication regimen. Discussed the fact that physical activity be very useful to help build up her strength and also to help with her back pain. Strongly encouraged her to get involved with [employer].

## 2015-02-15 NOTE — Patient Instructions (Signed)
Go to [employer]. Use Advil or Aleve for your pain.

## 2015-04-15 ENCOUNTER — Telehealth: Payer: Self-pay | Admitting: Family Medicine

## 2015-04-15 NOTE — Telephone Encounter (Signed)
Pt will bring in [employer] form but needs it completed by 05/09/15

## 2015-04-22 ENCOUNTER — Ambulatory Visit (INDEPENDENT_AMBULATORY_CARE_PROVIDER_SITE_OTHER): Payer: Medicare Other | Admitting: Podiatry

## 2015-04-22 ENCOUNTER — Encounter: Payer: Self-pay | Admitting: Podiatry

## 2015-04-22 DIAGNOSIS — M79676 Pain in unspecified toe(s): Secondary | ICD-10-CM | POA: Diagnosis not present

## 2015-04-22 DIAGNOSIS — E1140 Type 2 diabetes mellitus with diabetic neuropathy, unspecified: Secondary | ICD-10-CM

## 2015-04-22 DIAGNOSIS — B351 Tinea unguium: Secondary | ICD-10-CM

## 2015-04-22 NOTE — Progress Notes (Signed)
Patient ID: Kayla [name], female   DOB: 05/14/1941, 74 y.o.   MRN: [id number] Complaint:  Visit Type: Patient returns to my office for continued preventative foot care services. Complaint: Patient states" my nails have grown long and thick and become painful to walk and wear shoes" Patient has been diagnosed with DM with neuropathy.. He presents for preventative foot care services. No changes to ROS,  Heel calluses noted.  Podiatric Exam: Vascular: dorsalis pedis and posterior tibial pulses are palpable bilateral. Capillary return is immediate. Temperature gradient is WNL. Skin turgor WNL  Sensorium: Normal Semmes Weinstein monofilament test. Normal tactile sensation bilaterally. Nail Exam: Pt has thick disfigured discolored nails with subungual debris noted bilateral entire nail hallux through fifth toenails Ulcer Exam: There is no evidence of ulcer or pre-ulcerative changes or infection. Orthopedic Exam: Muscle tone and strength are WNL. No limitations in general ROM. No crepitus or effusions noted. Foot type and digits show no abnormalities. Bony prominences are unremarkable. Skin: No Porokeratosis. No infection or ulcers.  Heel calluses B/L.  Diagnosis:  Tinea unguium, Pain in right toe, pain in left toes  Treatment & Plan Procedures and Treatment: Consent by patient was obtained for treatment procedures. The patient understood the discussion of treatment and procedures well. All questions were answered thoroughly reviewed. Debridement of mycotic and hypertrophic toenails, 1 through 5 bilateral and clearing of subungual debris. No ulceration, no infection noted.  Return Visit-Office Procedure: Patient instructed to return to the office for a follow up visit 3 months for continued evaluation and treatment. 

## 2015-04-28 ENCOUNTER — Other Ambulatory Visit: Payer: Self-pay | Admitting: Family Medicine

## 2015-04-29 ENCOUNTER — Ambulatory Visit (INDEPENDENT_AMBULATORY_CARE_PROVIDER_SITE_OTHER): Payer: Medicare Other | Admitting: Family Medicine

## 2015-04-29 ENCOUNTER — Encounter: Payer: Self-pay | Admitting: Family Medicine

## 2015-04-29 VITALS — BP 118/72 | HR 68 | Wt 220.2 lb

## 2015-04-29 DIAGNOSIS — H9193 Unspecified hearing loss, bilateral: Secondary | ICD-10-CM | POA: Diagnosis not present

## 2015-04-29 DIAGNOSIS — R519 Headache, unspecified: Secondary | ICD-10-CM

## 2015-04-29 DIAGNOSIS — R51 Headache: Secondary | ICD-10-CM | POA: Diagnosis not present

## 2015-04-29 DIAGNOSIS — M542 Cervicalgia: Secondary | ICD-10-CM

## 2015-04-29 NOTE — Progress Notes (Signed)
   Subjective:    Patient ID: Kayla [name], female    DOB: [birth year], 73 y.o.   MRN: [id number]  HPI She is here for consult concerning continued to difficulty with hearing loss. In the past she had had some trauma to one of her ears and she states that she is still having difficulty hearing. She also complains of headache but did not localize where the headache was occurring. She does state that when she has a runny nose, the headache does diminish. He has had no blurred vision, double vision, nausea, vomiting. She does walk with a cane. She's had no sneezing, itchy watery eyes. She has no previous history of allergies. He also complains of neck pain especially when she rotates her neck or moves it up or down but again no numbness, tingling or weakness in her arms. She is a poor historian.   Review of Systems     Objective:   Physical Exam Alert and in no distress. Both TMs appear normal. Neck is supple without adenopathy. Pain on motion of the neck. No palpable tenderness. Hearing screening in the office showed no ability to hear at all from the left ear and 40 dB hearing loss across all frequencies on the right.       Assessment & Plan:  Neck pain - Plan: DG Cervical Spine 2 or 3 views  Headache, chronic daily - Plan: Ambulatory referral to ENT  Hearing loss, bilateral - Plan: Ambulatory referral to ENT  history of headache and relief with rhinorrhea is certainly concerning, this plus the profound hearing loss has been quite concerned. The neck pain seems to be arthritic in nature however I will get an x-ray to look at this.

## 2015-05-06 ENCOUNTER — Ambulatory Visit: Payer: Medicare Other | Admitting: Family Medicine

## 2015-05-17 ENCOUNTER — Telehealth: Payer: Self-pay | Admitting: Family Medicine

## 2015-05-17 NOTE — Telephone Encounter (Signed)
Pt dropped off a [employer] form to be completed. Pt states that last time she was in it was competed but [employer] states part was missing. Apparently we did not get a copy because it is nowhere in her chart. Please complete all portions of form and return to [place]. Form needs to be faxed to [phone] and then mailed to pt. I am sending back form in folder.

## 2015-05-18 NOTE — Telephone Encounter (Signed)
Form completed. Faxed per instructions and mailed to pt.

## 2015-05-28 LAB — HM MAMMOGRAPHY: HM Mammogram: NEGATIVE

## 2015-05-31 ENCOUNTER — Other Ambulatory Visit: Payer: Self-pay | Admitting: Family Medicine

## 2015-06-03 ENCOUNTER — Encounter: Payer: Self-pay | Admitting: Family Medicine

## 2015-07-14 ENCOUNTER — Ambulatory Visit: Payer: Medicare Other | Admitting: Podiatry

## 2015-07-21 ENCOUNTER — Ambulatory Visit (INDEPENDENT_AMBULATORY_CARE_PROVIDER_SITE_OTHER): Payer: Medicare Other | Admitting: Podiatry

## 2015-07-21 ENCOUNTER — Encounter: Payer: Self-pay | Admitting: Podiatry

## 2015-07-21 DIAGNOSIS — E1140 Type 2 diabetes mellitus with diabetic neuropathy, unspecified: Secondary | ICD-10-CM

## 2015-07-21 DIAGNOSIS — M79676 Pain in unspecified toe(s): Secondary | ICD-10-CM | POA: Diagnosis not present

## 2015-07-21 DIAGNOSIS — B351 Tinea unguium: Secondary | ICD-10-CM | POA: Diagnosis not present

## 2015-07-21 NOTE — Progress Notes (Signed)
Patient ID: Kayla [name], female   DOB: Sep 12, 1940, 74 y.o.   MRN: [id number] Complaint:  Visit Type: Patient returns to my office for continued preventative foot care services. Complaint: Patient states" my nails have grown long and thick and become painful to walk and wear shoes" Patient has been diagnosed with DM with neuropathy.[name] He presents for preventative foot care services. No changes to ROS,  Heel calluses noted.  Podiatric Exam: Vascular: dorsalis pedis and posterior tibial pulses are palpable bilateral. Capillary return is immediate. Temperature gradient is WNL. Skin turgor WNL  Sensorium: Normal Semmes Weinstein monofilament test. Normal tactile sensation bilaterally. Nail Exam: Pt has thick disfigured discolored nails with subungual debris noted bilateral entire nail hallux through fifth toenails Ulcer Exam: There is no evidence of ulcer or pre-ulcerative changes or infection. Orthopedic Exam: Muscle tone and strength are WNL. No limitations in general ROM. No crepitus or effusions noted. Foot type and digits show no abnormalities. Bony prominences are unremarkable. Skin: No Porokeratosis. No infection or ulcers.  Heel calluses B/L.  Diagnosis:  Tinea unguium, Pain in right toe, pain in left toes  Treatment & Plan Procedures and Treatment: Consent by patient was obtained for treatment procedures. The patient understood the discussion of treatment and procedures well. All questions were answered thoroughly reviewed. Debridement of mycotic and hypertrophic toenails, 1 through 5 bilateral and clearing of subungual debris. No ulceration, no infection noted.  Return Visit-Office Procedure: Patient instructed to return to the office for a follow up visit 3 months for continued evaluation and treatment.   Gardiner Barefoot DPM

## 2015-07-23 ENCOUNTER — Encounter: Payer: Self-pay | Admitting: Family Medicine

## 2015-07-23 ENCOUNTER — Ambulatory Visit (INDEPENDENT_AMBULATORY_CARE_PROVIDER_SITE_OTHER): Payer: Medicare Other | Admitting: Family Medicine

## 2015-07-23 VITALS — BP 130/72 | HR 66 | Temp 98.1°F | Resp 14 | Wt 211.9 lb

## 2015-07-23 DIAGNOSIS — J040 Acute laryngitis: Secondary | ICD-10-CM | POA: Diagnosis not present

## 2015-07-23 NOTE — Patient Instructions (Signed)
Try Claritin for the drainage. Voice rest

## 2015-07-23 NOTE — Progress Notes (Signed)
   Subjective:    Patient ID: Kayla [name], female    DOB: [birth year], 74 y.o.   MRN: [id number]  HPI She complains of a 2 day history of hoarse voice, sneezing, right ear discomfort, PND but no sore throat, fever, chills or shortness of breath.   Review of Systems     Objective:   Physical Exam Alert and in no distress. Tympanic membranes and canals are normal. Pharyngeal area is normal. Neck is supple without adenopathy or thyromegaly. Cardiac exam shows a regular sinus rhythm without murmurs or gallops. Lungs are clear to auscultation.        Assessment & Plan:  Laryngitis  recommended supportive care and call if further difficulty.

## 2015-08-03 ENCOUNTER — Other Ambulatory Visit: Payer: Self-pay | Admitting: Family Medicine

## 2015-08-30 ENCOUNTER — Other Ambulatory Visit (INDEPENDENT_AMBULATORY_CARE_PROVIDER_SITE_OTHER): Payer: Medicare Other

## 2015-08-30 DIAGNOSIS — Z23 Encounter for immunization: Secondary | ICD-10-CM | POA: Diagnosis not present

## 2015-09-06 ENCOUNTER — Telehealth: Payer: Self-pay | Admitting: *Deleted

## 2015-09-06 NOTE — Telephone Encounter (Signed)
Patient advised.

## 2015-09-06 NOTE — Telephone Encounter (Signed)
Patient called and said that she spoke with you this weekend (on call) and you recommended a cough medicine. She said mucinex HBP so I wasn't sure if ti was mucinex or coricidin HBP or both?

## 2015-09-06 NOTE — Telephone Encounter (Signed)
It was both--Mucinex (plain, with separate delsym prn, or Mucinex DM) and coricidin HBP

## 2015-09-13 ENCOUNTER — Encounter: Payer: Self-pay | Admitting: Family Medicine

## 2015-09-13 ENCOUNTER — Ambulatory Visit (INDEPENDENT_AMBULATORY_CARE_PROVIDER_SITE_OTHER): Payer: Medicare Other | Admitting: Family Medicine

## 2015-09-13 VITALS — BP 128/76 | HR 73 | Wt 223.0 lb

## 2015-09-13 DIAGNOSIS — B351 Tinea unguium: Secondary | ICD-10-CM | POA: Diagnosis not present

## 2015-09-13 DIAGNOSIS — I152 Hypertension secondary to endocrine disorders: Secondary | ICD-10-CM

## 2015-09-13 DIAGNOSIS — R296 Repeated falls: Secondary | ICD-10-CM | POA: Diagnosis not present

## 2015-09-13 DIAGNOSIS — E669 Obesity, unspecified: Secondary | ICD-10-CM | POA: Diagnosis not present

## 2015-09-13 DIAGNOSIS — I10 Essential (primary) hypertension: Secondary | ICD-10-CM | POA: Diagnosis not present

## 2015-09-13 DIAGNOSIS — E1159 Type 2 diabetes mellitus with other circulatory complications: Secondary | ICD-10-CM | POA: Diagnosis not present

## 2015-09-13 DIAGNOSIS — E118 Type 2 diabetes mellitus with unspecified complications: Secondary | ICD-10-CM | POA: Diagnosis not present

## 2015-09-13 DIAGNOSIS — E1140 Type 2 diabetes mellitus with diabetic neuropathy, unspecified: Secondary | ICD-10-CM | POA: Diagnosis not present

## 2015-09-13 LAB — POCT GLYCOSYLATED HEMOGLOBIN (HGB A1C): Hemoglobin A1C: 6.8

## 2015-09-13 NOTE — Progress Notes (Signed)
  Subjective:    Patient ID: Kayla [name], female    DOB: 02-15-1941, 75 y.o.   MRN: [id number]  Kayla [name] is a 75 y.o. female who presents for follow-up of Type 2 diabetes mellitus.  Home blood sugar records: fasting range: 95 to 115 Current symptoms/problems include none and have been stable. Daily foot checks:   Any foot concerns: has a brace on her left foot which does interfere with her physical mobility. Exercise: The patient does not participate in regular exercise at present. planning on joining [employer].She has had no recent falls. She does complain of some itching over the dorsum of her right foot and has been using cortisone cream. Her last eye exam was June 2016 The following portions of the patient's history were reviewed and updated as appropriate: allergies, current medications, past medical history, past social history and problem list.  ROS as in subjective above.     Objective:    Physical Exam Alert and in no distress.Right foot exam does show evidence of thickening of the nails however the dorsum of the foot has normal sensation, not hot red or tender  Blood pressure 128/76, pulse 73, weight 223 lb (101.152 kg), SpO2 98 %.  Lab Review Diabetic Labs Latest Ref Rng 09/13/2015 01/04/2015 08/31/2014 04/01/2014 11/06/2013  HbA1c - 6.8 6.4 6.6% 6.4 6.5  Chol 0 - 200 mg/dL - 161 - - 157  HDL >=46 mg/dL - 53 - - 53  Calc LDL 0 - 99 mg/dL - 95 - - 92  Triglycerides <150 mg/dL - 65 - - 61  Creatinine 0.50 - 1.10 mg/dL - 0.73 - - 0.72   BP/Weight 09/13/2015 07/23/2015 04/29/2015 [id number]  Systolic BP 0000000 [id number]  Diastolic BP 76 72 72 74 80  Wt. (Lbs) 223 211.9 220.2 226 223  BMI 34.92 33.18 34.48 35.39 34.92   Foot/eye exam completion dates Latest Ref Rng 01/04/2015 12/31/2014  Eye Exam No Retinopathy - No Retinopathy  Foot Form Completion - Done -  Hemoglobin A1c is 6.8  Kayla [name]  reports that she has never smoked. She has never used smokeless  tobacco. She reports that she does not drink alcohol or use illicit drugs.     Assessment & Plan:    Type 2 diabetes mellitus with diabetic neuropathy, unspecified long term insulin use status ([address]) - Plan: POCT glycosylated hemoglobin (Hb A1C)  Onychomycosis  Multiple falls  Type 2 diabetes mellitus with complication, without long-term current use of insulin ([place])  Hypertension associated with diabetes ([place])  Obesity   Rx changes: none  Education: Reviewed 'ABCs' of diabetes management (respective goals in parentheses):  A1C (<7), blood pressure (<130/80), and cholesterol (LDL <100).  Compliance at present is estimated to be good. Efforts to improve compliance (if necessary) will be directed at increased exercise.She does plan on joining [employer]  Follow up: 4 months No therapy at the present time for her onychomycosis.

## 2015-09-21 ENCOUNTER — Telehealth: Payer: Self-pay | Admitting: Family Medicine

## 2015-09-21 NOTE — Telephone Encounter (Signed)
Informed patient it is ok to take medications.

## 2015-09-21 NOTE — Telephone Encounter (Signed)
Tell her  that's fine

## 2015-09-21 NOTE — Telephone Encounter (Signed)
Pt called and wanted to know if she could take rainbow vitamins with pro-biotics. Please advise pt at [phone].

## 2015-10-05 ENCOUNTER — Other Ambulatory Visit: Payer: Self-pay | Admitting: Family Medicine

## 2015-10-18 ENCOUNTER — Other Ambulatory Visit: Payer: Self-pay | Admitting: Family Medicine

## 2015-10-18 NOTE — Telephone Encounter (Signed)
Pt needs refill as soon as possible on her Aspirin, because she has SKAT set up to take her today to the pharmacy

## 2015-10-20 ENCOUNTER — Ambulatory Visit (INDEPENDENT_AMBULATORY_CARE_PROVIDER_SITE_OTHER): Payer: Medicare Other | Admitting: Podiatry

## 2015-10-20 DIAGNOSIS — M79676 Pain in unspecified toe(s): Secondary | ICD-10-CM | POA: Diagnosis not present

## 2015-10-20 DIAGNOSIS — E1140 Type 2 diabetes mellitus with diabetic neuropathy, unspecified: Secondary | ICD-10-CM

## 2015-10-20 DIAGNOSIS — B351 Tinea unguium: Secondary | ICD-10-CM

## 2015-10-20 NOTE — Progress Notes (Signed)
Patient ID: Kayla [name], female   DOB: 09/16/1940, 75 y.o.   MRN: [id number] Complaint:  Visit Type: Patient returns to my office for continued preventative foot care services. Complaint: Patient states" my nails have grown long and thick and become painful to walk and wear shoes" Patient has been diagnosed with DM with neuropathy.. He presents for preventative foot care services. No changes to ROS,  Heel calluses noted.  Podiatric Exam: Vascular: dorsalis pedis and posterior tibial pulses are palpable bilateral. Capillary return is immediate. Temperature gradient is WNL. Skin turgor WNL  Sensorium: Normal Semmes Weinstein monofilament test. Normal tactile sensation bilaterally. Nail Exam: Pt has thick disfigured discolored nails with subungual debris noted bilateral entire nail hallux through fifth toenails Ulcer Exam: There is no evidence of ulcer or pre-ulcerative changes or infection. Orthopedic Exam: Muscle tone and strength are WNL. No limitations in general ROM. No crepitus or effusions noted. Foot type and digits show no abnormalities. Bony prominences are unremarkable. Skin: No Porokeratosis. No infection or ulcers.  Heel calluses B/L.  Diagnosis:  Tinea unguium, Pain in right toe, pain in left toes  Treatment & Plan Procedures and Treatment: Consent by patient was obtained for treatment procedures. The patient understood the discussion of treatment and procedures well. All questions were answered thoroughly reviewed. Debridement of mycotic and hypertrophic toenails, 1 through 5 bilateral and clearing of subungual debris. No ulceration, no infection noted.  Return Visit-Office Procedure: Patient instructed to return to the office for a follow up visit 3 months for continued evaluation and treatment.   Aliea Bobe DPM 

## 2015-10-21 ENCOUNTER — Telehealth: Payer: Self-pay | Admitting: Family Medicine

## 2015-10-21 MED ORDER — ATORVASTATIN CALCIUM 80 MG PO TABS: ORAL_TABLET | ORAL | Status: DC

## 2015-10-21 MED ORDER — METFORMIN HCL 500 MG PO TABS: ORAL_TABLET | ORAL | Status: DC

## 2015-10-21 NOTE — Telephone Encounter (Signed)
Pt needs refills Atorvastatin & Metformin to [facility] mail order for 90 days each

## 2015-10-21 NOTE — Telephone Encounter (Signed)
Done

## 2015-12-02 LAB — HM DIABETES EYE EXAM

## 2015-12-09 ENCOUNTER — Other Ambulatory Visit: Payer: Self-pay | Admitting: Family Medicine

## 2015-12-16 ENCOUNTER — Encounter: Payer: Self-pay | Admitting: Family Medicine

## 2016-01-12 ENCOUNTER — Encounter: Payer: Self-pay | Admitting: Podiatry

## 2016-01-12 ENCOUNTER — Ambulatory Visit (INDEPENDENT_AMBULATORY_CARE_PROVIDER_SITE_OTHER): Payer: Medicare Other | Admitting: Podiatry

## 2016-01-12 DIAGNOSIS — E1140 Type 2 diabetes mellitus with diabetic neuropathy, unspecified: Secondary | ICD-10-CM | POA: Diagnosis not present

## 2016-01-12 DIAGNOSIS — B351 Tinea unguium: Secondary | ICD-10-CM | POA: Diagnosis not present

## 2016-01-12 DIAGNOSIS — M79676 Pain in unspecified toe(s): Secondary | ICD-10-CM | POA: Diagnosis not present

## 2016-01-12 NOTE — Progress Notes (Signed)
Patient ID: Kayla [name], female   DOB: [birth year], 75 y.o.   MRN: [id number] Complaint:  Visit Type: Patient returns to my office for continued preventative foot care services. Complaint: Patient states" my nails have grown long and thick and become painful to walk and wear shoes" Patient has been diagnosed with DM with neuropathy.[name] He presents for preventative foot care services. No changes to ROS,  Heel calluses noted.  Podiatric Exam: Vascular: dorsalis pedis and posterior tibial pulses are palpable bilateral. Capillary return is immediate. Temperature gradient is WNL. Skin turgor WNL  Sensorium: Normal Semmes Weinstein monofilament test. Normal tactile sensation bilaterally. Nail Exam: Pt has thick disfigured discolored nails with subungual debris noted bilateral entire nail hallux through fifth toenails Ulcer Exam: There is no evidence of ulcer or pre-ulcerative changes or infection. Orthopedic Exam: Muscle tone and strength are WNL. No limitations in general ROM. No crepitus or effusions noted. Foot type and digits show no abnormalities. Bony prominences are unremarkable. Skin: No Porokeratosis. No infection or ulcers.  Heel calluses B/L.  Diagnosis:  Tinea unguium, Pain in right toe, pain in left toes  Treatment & Plan Procedures and Treatment: Consent by patient was obtained for treatment procedures. The patient understood the discussion of treatment and procedures well. All questions were answered thoroughly reviewed. Debridement of mycotic and hypertrophic toenails, 1 through 5 bilateral and clearing of subungual debris. No ulceration, no infection noted.  Return Visit-Office Procedure: Patient instructed to return to the office for a follow up visit 3 months for continued evaluation and treatment.   Gardiner Barefoot DPM

## 2016-02-17 ENCOUNTER — Other Ambulatory Visit: Payer: Self-pay | Admitting: Family Medicine

## 2016-03-06 ENCOUNTER — Telehealth: Payer: Self-pay

## 2016-03-06 ENCOUNTER — Ambulatory Visit (INDEPENDENT_AMBULATORY_CARE_PROVIDER_SITE_OTHER): Payer: Medicare Other | Admitting: Family Medicine

## 2016-03-06 ENCOUNTER — Encounter: Payer: Self-pay | Admitting: Family Medicine

## 2016-03-06 VITALS — BP 130/80 | HR 79 | Ht 67.0 in | Wt 229.0 lb

## 2016-03-06 DIAGNOSIS — E1140 Type 2 diabetes mellitus with diabetic neuropathy, unspecified: Secondary | ICD-10-CM | POA: Diagnosis not present

## 2016-03-06 DIAGNOSIS — H9193 Unspecified hearing loss, bilateral: Secondary | ICD-10-CM

## 2016-03-06 DIAGNOSIS — E669 Obesity, unspecified: Secondary | ICD-10-CM

## 2016-03-06 DIAGNOSIS — E1169 Type 2 diabetes mellitus with other specified complication: Secondary | ICD-10-CM

## 2016-03-06 DIAGNOSIS — D638 Anemia in other chronic diseases classified elsewhere: Secondary | ICD-10-CM | POA: Diagnosis not present

## 2016-03-06 DIAGNOSIS — I152 Hypertension secondary to endocrine disorders: Secondary | ICD-10-CM

## 2016-03-06 DIAGNOSIS — E785 Hyperlipidemia, unspecified: Secondary | ICD-10-CM

## 2016-03-06 DIAGNOSIS — E1159 Type 2 diabetes mellitus with other circulatory complications: Secondary | ICD-10-CM

## 2016-03-06 DIAGNOSIS — I10 Essential (primary) hypertension: Secondary | ICD-10-CM

## 2016-03-06 LAB — COMPREHENSIVE METABOLIC PANEL
ALT: 14 U/L (ref 6–29)
AST: 16 U/L (ref 10–35)
Albumin: 3.9 g/dL (ref 3.6–5.1)
Alkaline Phosphatase: 80 U/L (ref 33–130)
BUN: 21 mg/dL (ref 7–25)
CO2: 25 mmol/L (ref 20–31)
Calcium: 9.3 mg/dL (ref 8.6–10.4)
Chloride: 102 mmol/L (ref 98–110)
Creat: 0.75 mg/dL (ref 0.60–0.93)
Glucose, Bld: 92 mg/dL (ref 65–99)
Potassium: 4.3 mmol/L (ref 3.5–5.3)
Sodium: 138 mmol/L (ref 135–146)
Total Bilirubin: 0.4 mg/dL (ref 0.2–1.2)
Total Protein: 6.9 g/dL (ref 6.1–8.1)

## 2016-03-06 LAB — CBC WITH DIFFERENTIAL/PLATELET
Basophils Absolute: 48 cells/uL (ref 0–200)
Basophils Relative: 1 %
Eosinophils Absolute: 144 cells/uL (ref 15–500)
Eosinophils Relative: 3 %
HCT: 32.9 % — ABNORMAL LOW (ref 35.0–45.0)
Hemoglobin: 10.6 g/dL — ABNORMAL LOW (ref 11.7–15.5)
Lymphocytes Relative: 36 %
Lymphs Abs: 1728 cells/uL (ref 850–3900)
MCH: 26 pg — ABNORMAL LOW (ref 27.0–33.0)
MCHC: 32.2 g/dL (ref 32.0–36.0)
MCV: 80.6 fL (ref 80.0–100.0)
MPV: 10.2 fL (ref 7.5–12.5)
Monocytes Absolute: 528 cells/uL (ref 200–950)
Monocytes Relative: 11 %
Neutro Abs: 2352 cells/uL (ref 1500–7800)
Neutrophils Relative %: 49 %
Platelets: 271 10*3/uL (ref 140–400)
RBC: 4.08 MIL/uL (ref 3.80–5.10)
RDW: 15.5 % — ABNORMAL HIGH (ref 11.0–15.0)
WBC: 4.8 10*3/uL (ref 4.0–10.5)

## 2016-03-06 LAB — LIPID PANEL
Cholesterol: 261 mg/dL — ABNORMAL HIGH (ref 125–200)
HDL: 63 mg/dL (ref >=46)
LDL Cholesterol: 181 mg/dL — ABNORMAL HIGH (ref <130)
Total CHOL/HDL Ratio: 4.1 Ratio (ref <=5.0)
Triglycerides: 85 mg/dL (ref <150)
VLDL: 17 mg/dL (ref <30)

## 2016-03-06 LAB — POCT GLYCOSYLATED HEMOGLOBIN (HGB A1C): Hemoglobin A1C: 6.9

## 2016-03-06 MED ORDER — CARVEDILOL 3.125 MG PO TABS: 3.1250 mg | ORAL_TABLET | Freq: Two times a day (BID) | ORAL | 3 refills | Status: DC

## 2016-03-06 MED ORDER — ATORVASTATIN CALCIUM 80 MG PO TABS: ORAL_TABLET | ORAL | 3 refills | Status: DC

## 2016-03-06 MED ORDER — METFORMIN HCL 500 MG PO TABS: ORAL_TABLET | ORAL | 1 refills | Status: DC

## 2016-03-06 MED ORDER — LOSARTAN POTASSIUM-HCTZ 50-12.5 MG PO TABS: 1.0000 | ORAL_TABLET | Freq: Every day | ORAL | 3 refills | Status: DC

## 2016-03-06 NOTE — Telephone Encounter (Signed)
Patients son [name] wants the health care power of attorney and the living will sent to him his mom was there and said I could send him a copy of each I have mailed these out

## 2016-03-06 NOTE — Patient Instructions (Signed)
Use water aerobics

## 2016-03-06 NOTE — Progress Notes (Signed)
Subjective:   HPI  Kayla [name] is a 75 y.o. female who presents for a complete physical.  Medical care team includes:  Dr.Mayer foot  Dr.Hung   Preventative care: Last ophthalmology visit:2017 Last dental visit:? Last colonoscopy:2014 Last mammogram:2016 Last gynecological exam: Last EKG:12/12/2010 Last labs:01/04/2015  Prior vaccinations: TD or Tdap:04/26/2006 Influenza:08/30/15 Pneumococcal: 13: 01/04/15 Shingles/Zostavax:6 Other:   Advanced directive:Yes Health care power of attorney:Yes Living will:Yes Concerns:She does have underlying diabetes and does occasionally check his blood sugars and they usually run below 100. She sees a podiatrist for her feet and she uses a brace due to arthritic condition. She did have her eyes checked She sees Dr. Benson Norway occasionally for GI issuesShe continues on atorvastatin, metformin, Coreg and losartan/ HCTZ. She does wear hearing aid on the left. Her exercise is quite minimal. She does have a history of anemia of Chronic illness. She also has a history of falls but none within the last year.   Reviewed their medical, surgical, family, social, medication, and allergy history and updated chart as appropriate.    Review of Systems Constitutional: -fever, -chills, -sweats, -unexpected weight change, -decreased appetite, -fatigue Allergy: -sneezing, -itching, -congestion Dermatology: -changing moles, --rash, -lumps ENT: -runny nose, -ear pain, -sore throat, -hoarseness, -sinus pain, -teeth pain, - ringing in ears, -hearing loss, -nosebleeds Cardiology: -chest pain, -palpitations, -swelling, -difficulty breathing when lying flat, -waking up short of breath Respiratory: -cough, -shortness of breath, -difficulty breathing with exercise or exertion, -wheezing, -coughing up blood Gastroenterology: -abdominal pain, -nausea, -vomiting, -diarrhea, -constipation, -blood in stool, -changes in bowel movement, -difficulty swallowing or  eating Hematology: -bleeding, -bruising  Musculoskeletal: -joint aches, -muscle aches, -joint swelling, -back pain, -neck pain, -cramping, -changes in gait Ophthalmology: denies vision changes, eye redness, itching, discharge Urology: -burning with urination, -difficulty urinating, -blood in urine, -urinary frequency, -urgency, -incontinence Neurology: -headache, -weakness, -tingling, -numbness, -memory loss, -falls, -dizziness Psychology: -depressed mood, -agitation, -sleep problems     Objective:   Physical Exam:Alert and in no distress with normal cognitive function  Skin: Normal General appearance: alert, no distress, WD/WN,  HEENT: normocephalic, conjunctiva/corneas normal, sclerae anicteric, PERRLA, EOMi, nares.Hearing aid present in left ear patent, no discharge or erythema, pharynx normal Oral cavity: MMM, tongue normal, teeth normal Neck: supple, no lymphadenopathy, no thyromegaly, no masses, normal ROM Chest: non tender, normal shape and expansion Heart: RRR, normal S1, S2, no murmurs Lungs: CTA bilaterally, no wheezes, rhonchi, or rales Pulses: 2+ symmetric, upper and lower extremities, normal cap refill Neurological: alert, oriented x 3, CN2-12 intact, strength normal upper extremities and lower extremities, sensation normal throughout, DTRs 2+ throughout, no cerebellar signs, gait normal Psychiatric: normal affect, behavior normal, pleasant  Hemoglobin A1c is 6.9    Assessment and Plan :   Type 2 diabetes mellitus with diabetic neuropathy, unspecified long term insulin use status ([facility]) - Plan: HgB A1c, metFORMIN (GLUCOPHAGE) 500 MG tablet, CBC with Differential/Platelet, Comprehensive metabolic panel, Lipid panel, POCT UA - Microalbumin  Hypertension associated with diabetes ([facility]CC) - Plan: losartan-hydrochlorothiazide (HYZAAR) 50-12.5 MG tablet, carvedilol (COREG) 3.125 MG tablet, CBC with Differential/Platelet, Comprehensive metabolic panel  Obesity  Hearing loss,  bilateral  Hyperlipidemia associated with type 2 diabetes mellitus ([facility]CC) - Plan: atorvastatin (LIPITOR) 80 MG tablet, Lipid panel  Anemia of chronic illness Recommend she get the shingles vaccine as well as TDaP at the pharmacy to keep her cost do Physical exam - discussed healthy lifestyle, diet, exercise, preventative care, vaccinations, and addressed their concerns.  Handout given.Encouraged her to tart  water aerobics.  Follow-up 4 months.

## 2016-03-20 ENCOUNTER — Telehealth: Payer: Self-pay | Admitting: Family Medicine

## 2016-03-20 NOTE — Telephone Encounter (Signed)
error 

## 2016-03-20 NOTE — Telephone Encounter (Signed)
Pt called and stated that a friend of hers told her to try Glucosamine for her arthritis pain. She wants to know if that is ok. Please call pt at [phone].

## 2016-03-21 NOTE — Telephone Encounter (Signed)
Ok

## 2016-03-21 NOTE — Telephone Encounter (Signed)
Pt was notified.  

## 2016-03-21 NOTE — Telephone Encounter (Signed)
Tried to call patient but unable to leave a message

## 2016-03-27 ENCOUNTER — Other Ambulatory Visit: Payer: Self-pay | Admitting: Family Medicine

## 2016-03-29 ENCOUNTER — Ambulatory Visit (INDEPENDENT_AMBULATORY_CARE_PROVIDER_SITE_OTHER): Payer: Medicare Other | Admitting: Podiatry

## 2016-03-29 DIAGNOSIS — B351 Tinea unguium: Secondary | ICD-10-CM

## 2016-03-29 DIAGNOSIS — M79676 Pain in unspecified toe(s): Secondary | ICD-10-CM | POA: Diagnosis not present

## 2016-03-29 DIAGNOSIS — E1140 Type 2 diabetes mellitus with diabetic neuropathy, unspecified: Secondary | ICD-10-CM | POA: Diagnosis not present

## 2016-03-29 NOTE — Progress Notes (Signed)
Patient ID: Kayla [name], female   DOB: 1940-09-06, 75 y.o.   MRN: [id number] Complaint:  Visit Type: Patient returns to my office for continued preventative foot care services. Complaint: Patient states" my nails have grown long and thick and become painful to walk and wear shoes" Patient has been diagnosed with DM with neuropathy.[name] He presents for preventative foot care services. No changes to ROS,  Heel calluses noted.  Podiatric Exam: Vascular: dorsalis pedis and posterior tibial pulses are palpable bilateral. Capillary return is immediate. Temperature gradient is WNL. Skin turgor WNL  Sensorium: Normal Semmes Weinstein monofilament test. Normal tactile sensation bilaterally. Nail Exam: Pt has thick disfigured discolored nails with subungual debris noted bilateral entire nail hallux through fifth toenails Ulcer Exam: There is no evidence of ulcer or pre-ulcerative changes or infection. Orthopedic Exam: Muscle tone and strength are WNL. No limitations in general ROM. No crepitus or effusions noted. Foot type and digits show no abnormalities. Bony prominences are unremarkable. Skin: No Porokeratosis. No infection or ulcers.  Heel calluses B/L.  Diagnosis:  Tinea unguium, Pain in right toe, pain in left toes  Treatment & Plan Procedures and Treatment: Consent by patient was obtained for treatment procedures. The patient understood the discussion of treatment and procedures well. All questions were answered thoroughly reviewed. Debridement of mycotic and hypertrophic toenails, 1 through 5 bilateral and clearing of subungual debris. No ulceration, no infection noted.  Return Visit-Office Procedure: Patient instructed to return to the office for a follow up visit 3 months for continued evaluation and treatment.   Gardiner Barefoot DPM

## 2016-04-04 ENCOUNTER — Telehealth: Payer: Self-pay | Admitting: Family Medicine

## 2016-04-04 NOTE — Telephone Encounter (Signed)
[name] the patients son left a message on my voice mail that you had mailed him [employer] Directive information.  He states he has some documents already in her will with [place] and wants to know if he can send you that.  Please call him at [phone]

## 2016-04-04 NOTE — Telephone Encounter (Signed)
Called and left message yes to either fax or mail [place] a copy to go into her chart

## 2016-04-12 ENCOUNTER — Ambulatory Visit: Payer: Medicare Other | Admitting: Podiatry

## 2016-05-31 LAB — HM MAMMOGRAPHY

## 2016-06-01 ENCOUNTER — Encounter: Payer: Self-pay | Admitting: Family Medicine

## 2016-06-28 ENCOUNTER — Ambulatory Visit (INDEPENDENT_AMBULATORY_CARE_PROVIDER_SITE_OTHER): Payer: Medicare Other | Admitting: Podiatry

## 2016-06-28 ENCOUNTER — Encounter: Payer: Self-pay | Admitting: Podiatry

## 2016-06-28 VITALS — Ht 67.0 in | Wt 229.0 lb

## 2016-06-28 DIAGNOSIS — E1140 Type 2 diabetes mellitus with diabetic neuropathy, unspecified: Secondary | ICD-10-CM | POA: Diagnosis not present

## 2016-06-28 DIAGNOSIS — M79676 Pain in unspecified toe(s): Secondary | ICD-10-CM | POA: Diagnosis not present

## 2016-06-28 DIAGNOSIS — B351 Tinea unguium: Secondary | ICD-10-CM

## 2016-06-28 NOTE — Progress Notes (Signed)
Patient ID: Kayla [name], female   DOB: [birth year], 75 y.o.   MRN: [id number] Complaint:  Visit Type: Patient returns to my office for continued preventative foot care services. Complaint: Patient states" my nails have grown long and thick and become painful to walk and wear shoes" Patient has been diagnosed with DM with neuropathy.[name] He presents for preventative foot care services. No changes to ROS,  Heel calluses noted.  Podiatric Exam: Vascular: dorsalis pedis and posterior tibial pulses are palpable bilateral. Capillary return is immediate. Temperature gradient is WNL. Skin turgor WNL  Sensorium: Normal Semmes Weinstein monofilament test. Normal tactile sensation bilaterally. Nail Exam: Pt has thick disfigured discolored nails with subungual debris noted bilateral entire nail hallux through fifth toenails Ulcer Exam: There is no evidence of ulcer or pre-ulcerative changes or infection. Orthopedic Exam: Muscle tone and strength are WNL. No limitations in general ROM. No crepitus or effusions noted. Foot type and digits show no abnormalities. Bony prominences are unremarkable. Skin: No Porokeratosis. No infection or ulcers.  Heel calluses B/L.  Diagnosis:  Tinea unguium, Pain in right toe, pain in left toes  Treatment & Plan Procedures and Treatment: Consent by patient was obtained for treatment procedures. The patient understood the discussion of treatment and procedures well. All questions were answered thoroughly reviewed. Debridement of mycotic and hypertrophic toenails, 1 through 5 bilateral and clearing of subungual debris. No ulceration, no infection noted.  Return Visit-Office Procedure: Patient instructed to return to the office for a follow up visit 3 months for continued evaluation and treatment.   Gardiner Barefoot DPM

## 2016-07-11 ENCOUNTER — Encounter: Payer: Self-pay | Admitting: Family Medicine

## 2016-07-11 ENCOUNTER — Ambulatory Visit (INDEPENDENT_AMBULATORY_CARE_PROVIDER_SITE_OTHER): Payer: Medicare Other | Admitting: Family Medicine

## 2016-07-11 VITALS — BP 120/70 | HR 80 | Ht 67.0 in | Wt 229.0 lb

## 2016-07-11 DIAGNOSIS — E1140 Type 2 diabetes mellitus with diabetic neuropathy, unspecified: Secondary | ICD-10-CM

## 2016-07-11 DIAGNOSIS — E6609 Other obesity due to excess calories: Secondary | ICD-10-CM | POA: Diagnosis not present

## 2016-07-11 DIAGNOSIS — IMO0001 Reserved for inherently not codable concepts without codable children: Secondary | ICD-10-CM

## 2016-07-11 DIAGNOSIS — I10 Essential (primary) hypertension: Secondary | ICD-10-CM | POA: Diagnosis not present

## 2016-07-11 DIAGNOSIS — E785 Hyperlipidemia, unspecified: Secondary | ICD-10-CM

## 2016-07-11 DIAGNOSIS — E1169 Type 2 diabetes mellitus with other specified complication: Secondary | ICD-10-CM | POA: Diagnosis not present

## 2016-07-11 DIAGNOSIS — Z23 Encounter for immunization: Secondary | ICD-10-CM | POA: Diagnosis not present

## 2016-07-11 DIAGNOSIS — I152 Hypertension secondary to endocrine disorders: Secondary | ICD-10-CM

## 2016-07-11 DIAGNOSIS — E1159 Type 2 diabetes mellitus with other circulatory complications: Secondary | ICD-10-CM | POA: Diagnosis not present

## 2016-07-11 LAB — POCT GLYCOSYLATED HEMOGLOBIN (HGB A1C): Hemoglobin A1C: 6.7

## 2016-07-11 NOTE — Progress Notes (Signed)
  Subjective:    Patient ID: Kayla [name], female    DOB: 10-14-1940, 75 y.o.   MRN: [id number]  Kayla [name] is a 75 y.o. female who presents for follow-up of Type 2 diabetes mellitus.  Patient is checking home blood sugars.   Home blood sugar records: 98 to 127 How often is blood sugars being checked: once a day Current symptoms/problems burning and stinging in both on and off Daily foot checks: yes   Any foot concerns:  Dry skin on foot. She has seen podiatrist but this was apparently not addressed. This does interfere with her physical activity level. Last eye exam: 12/02/15 Exercise: no because of foot  She continues on her Lipitor as well as Coreg and losartan. She also takes metformin. She is having no difficulty with any these medications. The following portions of the patient's history were reviewed and updated as appropriate: allergies, current medications, past medical history, past social history and problem list.  ROS as in subjective above.     Objective:    Physical Exam Alert and in no distress .Both feet do show extensive dryness especially over the heels with thickening of the skin.   Lab Review Diabetic Labs Latest Ref Rng & Units 03/06/2016 09/13/2015 01/04/2015 08/31/2014 04/01/2014  HbA1c - 6.9 6.8 6.4 6.6% 6.4  Chol 125 - 200 mg/dL 261(H) - 161 - -  HDL >=46 mg/dL 63 - 53 - -  Calc LDL <130 mg/dL 181(H) - 95 - -  Triglycerides <150 mg/dL 85 - 65 - -  Creatinine 0.60 - 0.93 mg/dL 0.75 - 0.73 - -   BP/Weight 06/28/2016 03/06/2016 09/13/2015 07/23/2015 [id number]  Systolic BP - [id number]  Diastolic BP - 80 76 72 72  Wt. (Lbs) 229 229 223 211.9 220.2  BMI 35.87 35.87 34.92 33.18 34.48   Foot/eye exam completion dates Latest Ref Rng & Units 12/02/2015 01/04/2015  Eye Exam No Retinopathy No Retinopathy -  Foot Form Completion - - Done  A1c is 6.7  [name]  reports that she has never smoked. She has never used smokeless tobacco. She reports that she does not drink  alcohol or use drugs.     Assessment & Plan:    Type 2 diabetes mellitus with diabetic neuropathy, unspecified long term insulin use status ([place]) - Plan: HgB A1c  Need for prophylactic vaccination and inoculation against influenza - Plan: Flu vaccine HIGH DOSE PF (Fluzone High dose)  Need for prophylactic vaccination against Streptococcus pneumoniae (pneumococcus) - Plan: Pneumococcal polysaccharide vaccine 23-valent greater than or equal to 2yo subcutaneous/IM  Hypertension associated with diabetes ([place])  Class 3 obesity due to excess calories without serious comorbidity in adult, unspecified BMI  Hyperlipidemia associated with type 2 diabetes mellitus ([place])   1. Rx changes: none 2. Education: Reviewed 'ABCs' of diabetes management (respective goals in parentheses):  A1C (<7), blood pressure (<130/80), and cholesterol (LDL <100). 3. Compliance at present is estimated to be good. Efforts to improve compliance (if necessary) will be directed at increased exercise. 4. Follow up: 4 months Encouraged her to discuss with her podiatrist the problems she is having with her feet. Apparently the thickness of the skin is interfering with her ability to walk due to pain.  He was also given a prescription for TDaP

## 2016-08-10 ENCOUNTER — Ambulatory Visit (INDEPENDENT_AMBULATORY_CARE_PROVIDER_SITE_OTHER): Payer: Medicare Other | Admitting: Family Medicine

## 2016-08-10 VITALS — BP 114/70 | HR 92 | Temp 98.5°F | Wt 224.0 lb

## 2016-08-10 DIAGNOSIS — B9789 Other viral agents as the cause of diseases classified elsewhere: Secondary | ICD-10-CM

## 2016-08-10 DIAGNOSIS — J069 Acute upper respiratory infection, unspecified: Secondary | ICD-10-CM | POA: Diagnosis not present

## 2016-08-10 DIAGNOSIS — R05 Cough: Secondary | ICD-10-CM | POA: Diagnosis not present

## 2016-08-10 DIAGNOSIS — R059 Cough, unspecified: Secondary | ICD-10-CM

## 2016-08-10 LAB — POC INFLUENZA A&B (BINAX/QUICKVUE)
Influenza A, POC: NEGATIVE
Influenza B, POC: NEGATIVE

## 2016-08-10 NOTE — Progress Notes (Signed)
   Subjective:    Patient ID: Kayla [name], female    DOB: 10/18/1940, 76 y.o.   MRN: [id number]  HPI She complains of a five-day history started with a cough followed by sore throat, rhinorrhea, hoarse voice but no fever, chills or myalgias. He is having some slight right upper chest discomfort. She has had a recent mammogram.   Review of Systems     Objective:   Physical Exam Alert and in no distress. Tympanic membranes and canals are normal. Pharyngeal area is normal. Neck is supple without adenopathy or thyromegaly. Cardiac exam shows a regular sinus rhythm without murmurs or gallops. Lungs are clear to auscultation.        Assessment & Plan:  Cough - Plan: POC Influenza A&B(BINAX/QUICKVUE)  Viral URI with cough Recommend supportive care with antihistamine and NyQuil at night. Called to beginning of the week is still having difficulty.

## 2016-08-10 NOTE — Patient Instructions (Signed)
Use the Allegra to help with the sneezing and the runny nose. He can use NyQuil at night to help with the coughing. If he keeps having difficulty To give me a call

## 2016-08-21 ENCOUNTER — Ambulatory Visit (INDEPENDENT_AMBULATORY_CARE_PROVIDER_SITE_OTHER): Payer: Medicare Other | Admitting: Medical

## 2016-08-21 ENCOUNTER — Encounter: Payer: Self-pay | Admitting: Medical

## 2016-08-21 VITALS — BP 136/80 | HR 87 | Temp 98.1°F | Wt 227.2 lb

## 2016-08-21 DIAGNOSIS — R05 Cough: Secondary | ICD-10-CM

## 2016-08-21 DIAGNOSIS — J988 Other specified respiratory disorders: Secondary | ICD-10-CM | POA: Diagnosis not present

## 2016-08-21 DIAGNOSIS — R059 Cough, unspecified: Secondary | ICD-10-CM

## 2016-08-21 MED ORDER — AZITHROMYCIN 250 MG PO TABS: ORAL_TABLET | ORAL | 0 refills | Status: DC

## 2016-08-21 NOTE — Progress Notes (Signed)
  Subjective:  Kayla [name] is a 76 y.o. female who presents for respiratory illness.  She was seen here  08/10/16 with Dr. Redmond School for same.   At that time she had 5 day hx/o cough, congestion, sore throat.  She had used some allegra and the runny nose improved but now has more persistent cough and productive cough, colored mucous.   Doesn't feel much different other than sore throat and runny nose improved.    Denies fever, SOB, wheezing.  No NVD.  No sick contacts.   No other aggravating or relieving factors. No other complaint.   Past Medical History:  Diagnosis Date  . Arthritis   . Diabetes mellitus   . Hypertension   . Obesity   . Pulmonary embolus (HCC)     ROS as in subjective   Objective: BP 136/80   Pulse 87   Temp 98.1 F (36.7 C)   Wt 227 lb 3.2 oz (103.1 kg)   SpO2 99%   BMI 35.58 kg/m   General appearance: Alert, WD/WN, no distress                             Skin: warm, no rash                           Head: no sinus tenderness                            Eyes: conjunctiva normal, corneas clear, PERRLA                            Ears: pearly TMs, external ear canals normal                          Nose: septum midline, turbinates swollen, with erythema and clear discharge             Mouth/throat: MMM, tongue normal, mild pharyngeal erythema                           Neck: supple, no adenopathy, no thyromegaly, non tender                          Heart: RRR, normal S1, S2, no murmurs                         Lungs: CTA bilaterally, no wheezes, rales, or rhonchi      Assessment  Encounter Diagnoses  Name Primary?  [name] Respiratory tract infection Yes  . Cough       Plan: Discussed symptoms, concerns.   Has some residual cough, productive cough, on day 16 of symptoms, more cough now than prior.  Advised relative rest, hydration, can use Mucienx DM.   Script for zpak, particularly if worse or not improving in the next 4-5 days.  patient voiced understanding  of diagnosis, recommendations, and treatment plan.  Reviewed her recent labs regarding diabetes, last HgbA1C 6.7% on 07/11/16.

## 2016-08-21 NOTE — Patient Instructions (Signed)
Recommendations  Begin Mucinex DM OTC for cough and mucous  Make sure you are drinking at least 64 oz water daily  If no improvement in the next 3-4 days, then begin Zpak antibiotic  If not improving or questions , then call back

## 2016-08-25 ENCOUNTER — Telehealth: Payer: Self-pay | Admitting: Family Medicine

## 2016-08-25 NOTE — Telephone Encounter (Signed)
Yes, begin Zpak

## 2016-08-25 NOTE — Telephone Encounter (Signed)
Pt has tried Mucinex as instructed. This has helped her cough slightly but still coughing. Pt wants to know if she should go ahead and pick up the antibiotic now and start it.

## 2016-08-28 NOTE — Telephone Encounter (Signed)
Pt aware and has been taking ABX over weekend.

## 2016-08-31 ENCOUNTER — Telehealth: Payer: Self-pay | Admitting: Family Medicine

## 2016-08-31 MED ORDER — AMOXICILLIN-POT CLAVULANATE 875-125 MG PO TABS: 1.0000 | ORAL_TABLET | Freq: Two times a day (BID) | ORAL | 0 refills | Status: DC

## 2016-08-31 NOTE — Telephone Encounter (Signed)
Pt was notified. Pt just finished up anitbotics last week and will see how she does over the next couple days to see if that will help if not she will pick up med

## 2016-08-31 NOTE — Telephone Encounter (Signed)
Let her know that I [facility]. When she finishes this if she's not totally back to normal, have her set up an appointment

## 2016-08-31 NOTE — Telephone Encounter (Signed)
Pt called and stated that she just has no energy. Pt was offered an appt but she stated she has been in twice recently. She saw [facility] once and then [place]. She states she has finished medication but is still coughing up mucus. Her biggest complaint is she just has no energy and is very tired. Pt can be reached at [phone] and uses [facility].

## 2016-09-01 ENCOUNTER — Telehealth: Payer: Self-pay | Admitting: Family Medicine

## 2016-09-01 NOTE — Telephone Encounter (Signed)
Pt says she took #1 Amoxicillin 875-125 mg yesterday evening for the first time and she had a very bad headache all night. She is afraid to take the med at all today in case it caused her headache. What should she do?

## 2016-09-01 NOTE — Telephone Encounter (Signed)
Pt aware. She will call back to update [place] if she has another headache.

## 2016-09-01 NOTE — Telephone Encounter (Signed)
Area unlikely that the antibiotic caused her headache. Have her take it again today and see how she does

## 2016-09-20 ENCOUNTER — Ambulatory Visit (INDEPENDENT_AMBULATORY_CARE_PROVIDER_SITE_OTHER): Payer: Medicare Other | Admitting: Podiatry

## 2016-09-20 ENCOUNTER — Encounter: Payer: Self-pay | Admitting: Podiatry

## 2016-09-20 DIAGNOSIS — E1140 Type 2 diabetes mellitus with diabetic neuropathy, unspecified: Secondary | ICD-10-CM | POA: Diagnosis not present

## 2016-09-20 DIAGNOSIS — M79676 Pain in unspecified toe(s): Secondary | ICD-10-CM | POA: Diagnosis not present

## 2016-09-20 DIAGNOSIS — L851 Acquired keratosis [keratoderma] palmaris et plantaris: Secondary | ICD-10-CM

## 2016-09-20 DIAGNOSIS — B351 Tinea unguium: Secondary | ICD-10-CM | POA: Diagnosis not present

## 2016-09-20 NOTE — Progress Notes (Signed)
Patient ID: Kayla [name], female   DOB: 01/07/1941, 76 y.o.   MRN: [id number] Complaint:  Visit Type: Patient returns to my office for continued preventative foot care services. Complaint: Patient states" my nails have grown long and thick and become painful to walk and wear shoes" Patient has been diagnosed with DM with neuropathy.. He presents for preventative foot care services. No changes to ROS,  Heel calluses noted.  Podiatric Exam: Vascular: dorsalis pedis and posterior tibial pulses are palpable bilateral. Capillary return is immediate. Temperature gradient is WNL. Skin turgor WNL  Sensorium: Normal Semmes Weinstein monofilament test. Normal tactile sensation bilaterally. Nail Exam: Pt has thick disfigured discolored nails with subungual debris noted bilateral entire nail hallux through fifth toenails Ulcer Exam: There is no evidence of ulcer or pre-ulcerative changes or infection. Orthopedic Exam: Muscle tone and strength are WNL. No limitations in general ROM. No crepitus or effusions noted. Foot type and digits show no abnormalities. Bony prominences are unremarkable. Skin: No Porokeratosis. No infection or ulcers.  Heel calluses B/L.  Diagnosis:  Tinea unguium, Pain in right toe, pain in left toes,  Debridement of heel callus.  Treatment & Plan Procedures and Treatment: Consent by patient was obtained for treatment procedures. The patient understood the discussion of treatment and procedures well. All questions were answered thoroughly reviewed. Debridement of mycotic and hypertrophic toenails, 1 through 5 bilateral and clearing of subungual debris. No ulceration, no infection noted.  Heel callus was treated with dremel tool. Return Visit-Office Procedure: Patient instructed to return to the office for a follow up visit 3 months for continued evaluation and treatment.   Jiyaan Steinhauser DPM 

## 2016-10-03 ENCOUNTER — Telehealth: Payer: Self-pay | Admitting: Family Medicine

## 2016-10-03 NOTE — Telephone Encounter (Signed)
yes

## 2016-10-03 NOTE — Telephone Encounter (Signed)
Pt called and asked if she can take a Vitamin D supplement. Please advise pt at [phone]. Ok to leave message.

## 2016-10-03 NOTE — Telephone Encounter (Signed)
Pt informed okay to take Vit D per [employer]

## 2016-10-03 NOTE — Telephone Encounter (Signed)
Called but got no answer

## 2016-10-30 ENCOUNTER — Other Ambulatory Visit: Payer: Self-pay | Admitting: Family Medicine

## 2016-10-30 DIAGNOSIS — E1140 Type 2 diabetes mellitus with diabetic neuropathy, unspecified: Secondary | ICD-10-CM

## 2016-11-25 ENCOUNTER — Emergency Department (HOSPITAL_COMMUNITY)
Admission: EM | Admit: 2016-11-25 | Discharge: 2016-11-25 | Disposition: A | Discharge status: Home / Self Care | Payer: Medicare Other | Attending: Emergency Medicine | Admitting: Emergency Medicine

## 2016-11-25 ENCOUNTER — Emergency Department (HOSPITAL_COMMUNITY): Payer: Medicare Other

## 2016-11-25 ENCOUNTER — Encounter (HOSPITAL_COMMUNITY): Payer: Self-pay | Admitting: Emergency Medicine

## 2016-11-25 DIAGNOSIS — E1140 Type 2 diabetes mellitus with diabetic neuropathy, unspecified: Secondary | ICD-10-CM | POA: Insufficient documentation

## 2016-11-25 DIAGNOSIS — Z79899 Other long term (current) drug therapy: Secondary | ICD-10-CM | POA: Insufficient documentation

## 2016-11-25 DIAGNOSIS — Z7984 Long term (current) use of oral hypoglycemic drugs: Secondary | ICD-10-CM | POA: Diagnosis not present

## 2016-11-25 DIAGNOSIS — Z7982 Long term (current) use of aspirin: Secondary | ICD-10-CM | POA: Diagnosis not present

## 2016-11-25 DIAGNOSIS — M19042 Primary osteoarthritis, left hand: Secondary | ICD-10-CM | POA: Diagnosis not present

## 2016-11-25 DIAGNOSIS — M79642 Pain in left hand: Secondary | ICD-10-CM

## 2016-11-25 DIAGNOSIS — I10 Essential (primary) hypertension: Secondary | ICD-10-CM | POA: Insufficient documentation

## 2016-11-25 LAB — BASIC METABOLIC PANEL
Anion gap: 10 (ref 5–15)
BUN: 15 mg/dL (ref 6–20)
CO2: 25 mmol/L (ref 22–32)
Calcium: 9.1 mg/dL (ref 8.9–10.3)
Chloride: 104 mmol/L (ref 101–111)
Creatinine, Ser: 0.8 mg/dL (ref 0.44–1.00)
GFR calc Af Amer: >60 mL/min (ref >60)
GFR calc non Af Amer: >60 mL/min (ref >60)
Glucose, Bld: 109 mg/dL — ABNORMAL HIGH (ref 65–99)
Potassium: 3.7 mmol/L (ref 3.5–5.1)
Sodium: 139 mmol/L (ref 135–145)

## 2016-11-25 LAB — SEDIMENTATION RATE: Sed Rate: 60 mm/hr — ABNORMAL HIGH (ref 0–22)

## 2016-11-25 LAB — CBC
HCT: 31.7 % — ABNORMAL LOW (ref 36.0–46.0)
Hemoglobin: 9.9 g/dL — ABNORMAL LOW (ref 12.0–15.0)
MCH: 24.8 pg — ABNORMAL LOW (ref 26.0–34.0)
MCHC: 31.2 g/dL (ref 30.0–36.0)
MCV: 79.3 fL (ref 78.0–100.0)
Platelets: 252 10*3/uL (ref 150–400)
RBC: 4 MIL/uL (ref 3.87–5.11)
RDW: 15.3 % (ref 11.5–15.5)
WBC: 6.6 10*3/uL (ref 4.0–10.5)

## 2016-11-25 LAB — D-DIMER, QUANTITATIVE (NOT AT ARMC): D-Dimer, Quant: 1.48 ug/mL-FEU — ABNORMAL HIGH (ref 0.00–0.50)

## 2016-11-25 LAB — I-STAT TROPONIN, ED: Troponin i, poc: 0 ng/mL (ref 0.00–0.08)

## 2016-11-25 MED ORDER — ACETAMINOPHEN 325 MG PO TABS
650.0000 mg | ORAL_TABLET | Freq: Once | ORAL | Status: AC
  Administered 2016-11-25: 650 mg via ORAL
  Filled 2016-11-25: qty 2

## 2016-11-25 MED ORDER — IBUPROFEN 400 MG PO TABS
400.0000 mg | ORAL_TABLET | Freq: Once | ORAL | Status: AC
  Administered 2016-11-25: 400 mg via ORAL
  Filled 2016-11-25: qty 1

## 2016-11-25 MED ORDER — IOPAMIDOL (ISOVUE-370) INJECTION 76%
INTRAVENOUS | Status: AC
  Administered 2016-11-25: 100 mL
  Filled 2016-11-25: qty 100

## 2016-11-25 NOTE — ED Triage Notes (Signed)
Pt. Stated, I woke up and had terrible hand pain, sore to touch. Pt. Denies any other symptoms.

## 2016-11-25 NOTE — ED Notes (Signed)
Pt transported to CT>  [facility]

## 2016-11-25 NOTE — ED Provider Notes (Signed)
[facility] DEPT Provider Note   CSN: [id number] Arrival date & time: 11/25/16  0725     History   Chief Complaint Chief Complaint  Patient presents with  . Hand Pain    HPI Kayla [name] is a 76 y.o. female.  HPI 76 year old female who presents today complaining of left hand pain. She denies any injury. States it is fine when she went to bed. She woke up at about 3 AM with pain in her left hand. She points to the thenar eminence. She has not had any fever or prior injury to this area. She also states that she had some sub-sternal discomfort on Wednesday. She denies that it was pain but states that she had difficulty breathing for about an hour. She also had some burping. It has been relieved. She has a history of PE in 2012 and was anticoagulated for at least a year afterwards. She is currently taking aspirin only. She denies any lower extremity swelling, history or signs of DVT. She is not having chest pain today. Past Medical History:  Diagnosis Date  . Arthritis   . Diabetes mellitus   . Hypertension   . Obesity   . Pulmonary embolus [facility])     Patient Active Problem List   Diagnosis Date Noted  . Anemia of chronic illness 02/15/2015  . Multiple falls 02/15/2015  . Diabetic neuropathy, type II diabetes mellitus ([place]) 09/09/2012  . Hyperlipidemia associated with type 2 diabetes mellitus ([place]) 05/22/2011  . Hypertension associated with diabetes ([place]) 05/22/2011  . Obesity 12/14/2010  . History of pulmonary embolus (PE) 12/14/2010    Past Surgical History:  Procedure Laterality Date  . ABDOMINAL HYSTERECTOMY    . CHOLECYSTECTOMY    . JOINT REPLACEMENT  Right    OB History    No data available       Home Medications    Prior to Admission medications   Medication Sig Start Date End Date Taking? Authorizing Provider  amoxicillin-clavulanate (AUGMENTIN) 875-125 MG tablet Take 1 tablet by mouth 2 (two) times daily. 08/31/16   Denita Lung, MD  aspirin EC 325  MG tablet USE PER THE INSTRUCTIONS ON PACKAGE LABEL 02/17/16   Denita Lung, MD  atorvastatin (LIPITOR) 80 MG tablet Take 1 tablet by mouth  daily at [facility] 03/06/16   Denita Lung, MD  azithromycin [facility]) 250 MG tablet 2 tablets day 1, then 1 tablet days 2-4 08/21/16   Camelia Eng Tysinger, PA-C  carvedilol (COREG) 3.125 MG tablet Take 1 tablet (3.125 mg total) by mouth 2 (two) times daily. 03/06/16   Denita Lung, MD  losartan-hydrochlorothiazide (HYZAAR) 50-12.5 MG tablet Take 1 tablet by mouth daily. 03/06/16   Denita Lung, MD  metFORMIN (GLUCOPHAGE) 500 MG tablet TAKE 1 TABLET BY MOUTH TWO TIMES DAILY WITH MEALS 10/30/16   Denita Lung, MD  Multiple Vitamins-Minerals (MULTIVITAMIN WITH MINERALS) tablet Take 1 tablet by mouth daily.    Historical Provider, MD  ONE TOUCH ULTRA TEST test strip USE AS INSTRUCTED (THIS IS FOR ONE TOUCH ULTRA BLUE) Patient taking differently: testing once a day(THIS IS FOR ONE TOUCH ULTRA BLUE) 07/25/13   Denita Lung, MD    Family History No family history on file.  Social History Social History  Substance Use Topics  . Smoking status: Never Smoker  . Smokeless tobacco: Never Used  . Alcohol use No     Allergies   Patient has no known allergies.   Review of  Systems Review of Systems  All other systems reviewed and are negative.    Physical Exam Updated Vital Signs BP (!) 163/68 (BP Location: Right Arm)   Pulse 92   Temp 98.4 F (36.9 C) (Oral)   Resp (!) 26   Ht 5\' 4"  (1.626 m)   Wt 105.9 kg   SpO2 95%   BMI 40.06 kg/m   Physical Exam  Constitutional: She appears well-developed and well-nourished. No distress.  HENT:  Head: Normocephalic and atraumatic.  Right Ear: External ear normal.  Left Ear: External ear normal.  Eyes: Pupils are equal, round, and reactive to light.  Neck: Normal range of motion.  Cardiovascular: Normal rate and regular rhythm.   Pulmonary/Chest: Effort normal and breath sounds normal.  Abdominal: Soft.  Bowel sounds are normal.  Musculoskeletal: Normal range of motion.       Hands: Some swelling left dorsal left hand mild warmth, no fluctuance, pain   Nursing note and vitals reviewed.    ED Treatments / Results  Labs (all labs ordered are listed, but only abnormal results are displayed) Labs Reviewed  CBC - Abnormal; Notable for the following:       Result Value   Hemoglobin 9.9 (*)    HCT 31.7 (*)    MCH 24.8 (*)    All other components within normal limits  D-DIMER, QUANTITATIVE (NOT AT [facility]) - Abnormal; Notable for the following:    D-Dimer, Quant 1.48 (*)    All other components within normal limits  BASIC METABOLIC PANEL  SEDIMENTATION RATE  I-STAT TROPOININ, ED    EKG  EKG Interpretation None       Radiology Dg Chest 2 View  Result Date: 11/25/2016 CLINICAL DATA:  Shortness of breath for 3 days EXAM: CHEST  2 VIEW COMPARISON:  February 01, 2011 FINDINGS: There is no edema or consolidation. The heart size and pulmonary vascularity are normal. No adenopathy. There is aortic atherosclerosis. There is degenerative change in the thoracic spine. IMPRESSION: Aortic atherosclerosis.  No edema or consolidation. Electronically Signed   By: Lowella Grip III M.D.   On: 11/25/2016 10:08   Ct Angio Chest Pe W Or Wo Contrast  Result Date: 11/25/2016 CLINICAL DATA:  Shortness of Breath EXAM: CT ANGIOGRAPHY CHEST WITH CONTRAST TECHNIQUE: Multidetector CT imaging of the chest was performed using the standard protocol during bolus administration of intravenous contrast. Multiplanar CT image reconstructions and MIPs were obtained to evaluate the vascular anatomy. CONTRAST:  100 mL Isovue 370 nonionic COMPARISON:  November 25, 2016 FINDINGS: Cardiovascular: There is no demonstrable pulmonary embolus. There is no thoracic aortic aneurysm or dissection. There is slight atherosclerotic calcification in the proximal visualized great vessels. Visualized great vessels otherwise appear normal. There  is scattered atherosclerotic calcification in the aorta. There are multiple foci of coronary artery calcification. Pericardium is not appreciably thickened. Mediastinum/Nodes: Thyroid appears unremarkable. There is no appreciable thoracic adenopathy. Lungs/Pleura: There is patchy lower lobe atelectatic change bilaterally, slightly more on the left than on the right. There is no appreciable edema or consolidation. There is no appreciable pleural effusion or pleural thickening. Upper Abdomen: There is aortic atherosclerosis. There is also calcification in the proximal superior mesenteric artery. Visualized upper abdominal structures otherwise appear unremarkable. Musculoskeletal: There is diffuse idiopathic skeletal hyperostosis in the thoracic spine. There are no blastic or lytic bone lesions. Review of the MIP images confirms the above findings. IMPRESSION: No demonstrable pulmonary embolus. Areas of patchy atelectatic change bilaterally, slightly more on the  left than on the right. No lung edema or consolidation. No adenopathy. There are foci of atherosclerotic calcification in the aorta and major arterial vessels as well as foci of coronary artery calcification. Electronically Signed   By: Lowella Grip III M.D.   On: 11/25/2016 10:54   Dg Hand 2 View Left  Result Date: 11/25/2016 CLINICAL DATA:  Pt complains of swelling and pain to left hand along the thumb and 1st metacarpal onset around 3 AM today and intermittent shortness of breath x 3 days. No known injury. Hx of DM, HTN, and PE 2012. EXAM: LEFT HAND - 2 VIEW COMPARISON:  None. FINDINGS: No acute fracture or dislocation. Moderate osteoarthritis of the first [place] joint. Moderate osteoarthritis of the first IP joint. Mild osteoarthritis of the PIP and DIP joints. Mild osteoarthritis of the scaphotrapeziotrapezoid joint. Mild osteoarthritis of the second MCP joint. Small erosion of the ulnar styloid process. No soft tissue abnormality. IMPRESSION: 1.  No  acute osseous injury of the left hand. 2. Scattered osteoarthritis of the left hand as described above. Electronically Signed   By: Kathreen Devoid   On: 11/25/2016 10:11    Procedures Procedures (including critical care time)  Medications Ordered in ED Medications  acetaminophen (TYLENOL) tablet 650 mg (not administered)     Initial Impression / Assessment and Plan / ED Course  I have reviewed the triage vital signs and the nursing notes.  Pertinent labs & imaging results that were available during my care of the patient were reviewed by me and considered in my medical decision making (see chart for details).     1- hand pain- no obvious infection - no redness, fluctuance- +mild warmth, swelling, no leukocytosis, sed rate 60  Discussed with hand surgery Discussed with Dr. Fredna Dow- likely oa pain with changes throughout hands notedpaln thumb spica, short course low dose nsaid, and f/u with return precautions.  2- episodic dyspnea- no pe , ekg without acute changes 3- anemia-stable Final Clinical Impressions(s) / ED Diagnoses   Final diagnoses:  Left hand pain  Osteoarthritis of left hand, unspecified osteoarthritis type    New Prescriptions New Prescriptions   No medications on file     Pattricia Boss, MD 11/25/16 1141

## 2016-11-25 NOTE — Discharge Instructions (Signed)
Take ibuprofen 400 mg up to 3 times a day for the next 3-5 days. Use splint. Return if increased redness, swelling, streaking, or fever. Recheck with Dr. Fredna Dow and hand is worsening. Follow-up with Dr. Redmond School this week.

## 2016-11-25 NOTE — ED Notes (Signed)
Pt reports she has doctors appointment scheduled for central chest pain that occurred this past Wednesday and Thursday while at rest, reports she felt short of breath with this. Hx of PE in 2012. Not currently on anticoagulation, only takes daily aspirin. Pt reports woke up this morning with left medial hand pain that is tender to touch. No redness noted, pulses equal bilaterally. Limited movement due to pain.

## 2016-11-27 ENCOUNTER — Ambulatory Visit (INDEPENDENT_AMBULATORY_CARE_PROVIDER_SITE_OTHER): Payer: Medicare Other | Admitting: Family Medicine

## 2016-11-27 ENCOUNTER — Encounter: Payer: Self-pay | Admitting: Family Medicine

## 2016-11-27 VITALS — BP 140/88 | HR 87 | Wt 225.0 lb

## 2016-11-27 DIAGNOSIS — R6889 Other general symptoms and signs: Secondary | ICD-10-CM

## 2016-11-27 DIAGNOSIS — J309 Allergic rhinitis, unspecified: Secondary | ICD-10-CM | POA: Diagnosis not present

## 2016-11-27 DIAGNOSIS — R531 Weakness: Secondary | ICD-10-CM | POA: Diagnosis not present

## 2016-11-27 LAB — POCT URINALYSIS DIPSTICK
Bilirubin, UA: NEGATIVE
Blood, UA: NEGATIVE
Glucose, UA: NEGATIVE
Ketones, UA: NEGATIVE
Leukocytes, UA: NEGATIVE
Nitrite, UA: NEGATIVE
Spec Grav, UA: >=1.03 — AB (ref 1.010–1.025)
Urobilinogen, UA: NEGATIVE E.U./dL — AB
pH, UA: 6 (ref 5.0–8.0)

## 2016-11-27 LAB — TSH: TSH: 0.92 mIU/L

## 2016-11-27 NOTE — Progress Notes (Signed)
   Subjective:    Patient ID: Kayla [name], female    DOB: 1940/09/17, 76 y.o.   MRN: [id number]  HPI She is here complaining of a one-month history of feeling weak, thinning of her hair and feeling cold although she states the cold is been going on for 3 months. She also has noted a decrease in her appetite. She does also complain of slight headache and rhinorrhea. No sneezing, itchy watery eyes, fever or chills. Review of the record does show hemoglobin 9.9 which is down from previous readings. She has seen Dr. Benson Norway in the past for this. Presently she is not on a multivitamin with iron.  Review of Systems     Objective:   Physical Exam Alert and in no distress. DTRs are normal. Tympanic membranes and canals are normal. Pharyngeal area is normal. Neck is supple without adenopathy or thyromegaly. Cardiac exam shows a regular sinus rhythm without murmurs or gallops. Lungs are clear to auscultation. Urinalysis is negative.       Assessment & Plan:  Weakness - Plan: TSH, Urinalysis Dipstick  Cold intolerance - Plan: TSH  Allergic rhinitis, unspecified seasonality, unspecified trigger Recommend she use Allegra as well as take Tylenol for the headache. Also to start back taking a vitamin with iron. We will follow-up based on her thyroid function.

## 2016-11-27 NOTE — Patient Instructions (Addendum)
Get back on a multivitamin with iron You can take 2 Tylenol 4 times a day for the headache. Start back on Allegra.

## 2016-12-06 LAB — HM DIABETES EYE EXAM

## 2016-12-13 ENCOUNTER — Ambulatory Visit (INDEPENDENT_AMBULATORY_CARE_PROVIDER_SITE_OTHER): Payer: Medicare Other | Admitting: Podiatry

## 2016-12-13 ENCOUNTER — Encounter: Payer: Self-pay | Admitting: Podiatry

## 2016-12-13 DIAGNOSIS — M79676 Pain in unspecified toe(s): Secondary | ICD-10-CM

## 2016-12-13 DIAGNOSIS — B351 Tinea unguium: Secondary | ICD-10-CM | POA: Diagnosis not present

## 2016-12-13 DIAGNOSIS — L84 Corns and callosities: Secondary | ICD-10-CM

## 2016-12-13 DIAGNOSIS — E1140 Type 2 diabetes mellitus with diabetic neuropathy, unspecified: Secondary | ICD-10-CM

## 2016-12-13 NOTE — Progress Notes (Signed)
Patient ID: Kayla [name], female   DOB: 12/07/1940, 76 y.o.   MRN: [id number] Complaint:  Visit Type: Patient returns to my office for continued preventative foot care services. Complaint: Patient states" my nails have grown long and thick and become painful to walk and wear shoes" Patient has been diagnosed with DM with neuropathy.. He presents for preventative foot care services. No changes to ROS,  Heel calluses noted.  Podiatric Exam: Vascular: dorsalis pedis and posterior tibial pulses are palpable bilateral. Capillary return is immediate. Temperature gradient is WNL. Skin turgor WNL  Sensorium: Normal Semmes Weinstein monofilament test. Normal tactile sensation bilaterally. Nail Exam: Pt has thick disfigured discolored nails with subungual debris noted bilateral entire nail hallux through fifth toenails Ulcer Exam: There is no evidence of ulcer or pre-ulcerative changes or infection. Orthopedic Exam: Muscle tone and strength are WNL. No limitations in general ROM. No crepitus or effusions noted. Foot type and digits show no abnormalities. Bony prominences are unremarkable. Skin: No Porokeratosis. No infection or ulcers.  Heel calluses B/L.  Diagnosis:  Tinea unguium, Pain in right toe, pain in left toes,  Debridement of heel callus.  Treatment & Plan Procedures and Treatment: Consent by patient was obtained for treatment procedures. The patient understood the discussion of treatment and procedures well. All questions were answered thoroughly reviewed. Debridement of mycotic and hypertrophic toenails, 1 through 5 bilateral and clearing of subungual debris. No ulceration, no infection noted.  Heel callus was treated with dremel tool. Return Visit-Office Procedure: Patient instructed to return to the office for a follow up visit 3 months for continued evaluation and treatment.   Essa Malachi DPM 

## 2016-12-18 ENCOUNTER — Encounter: Payer: Self-pay | Admitting: Family Medicine

## 2017-01-01 ENCOUNTER — Ambulatory Visit (INDEPENDENT_AMBULATORY_CARE_PROVIDER_SITE_OTHER): Payer: Medicare Other | Admitting: Family Medicine

## 2017-01-01 ENCOUNTER — Telehealth: Payer: Self-pay | Admitting: Family Medicine

## 2017-01-01 ENCOUNTER — Encounter: Payer: Self-pay | Admitting: Family Medicine

## 2017-01-01 DIAGNOSIS — R609 Edema, unspecified: Secondary | ICD-10-CM | POA: Diagnosis not present

## 2017-01-01 DIAGNOSIS — E1169 Type 2 diabetes mellitus with other specified complication: Secondary | ICD-10-CM | POA: Diagnosis not present

## 2017-01-01 DIAGNOSIS — I10 Essential (primary) hypertension: Secondary | ICD-10-CM | POA: Diagnosis not present

## 2017-01-01 DIAGNOSIS — Z6841 Body Mass Index (BMI) 40.0 and over, adult: Secondary | ICD-10-CM

## 2017-01-01 DIAGNOSIS — E1159 Type 2 diabetes mellitus with other circulatory complications: Secondary | ICD-10-CM | POA: Diagnosis not present

## 2017-01-01 DIAGNOSIS — E1140 Type 2 diabetes mellitus with diabetic neuropathy, unspecified: Secondary | ICD-10-CM | POA: Diagnosis not present

## 2017-01-01 DIAGNOSIS — E785 Hyperlipidemia, unspecified: Secondary | ICD-10-CM

## 2017-01-01 DIAGNOSIS — I152 Hypertension secondary to endocrine disorders: Secondary | ICD-10-CM

## 2017-01-01 LAB — POCT GLYCOSYLATED HEMOGLOBIN (HGB A1C): Hemoglobin A1C: 6.5

## 2017-01-01 MED ORDER — ASPIRIN EC 325 MG PO TBEC: DELAYED_RELEASE_TABLET | ORAL | 3 refills | Status: DC

## 2017-01-01 NOTE — Telephone Encounter (Signed)
[facility] [address] F[facility] refill request  [facility] aspirin EC 325 mg #90

## 2017-01-01 NOTE — Progress Notes (Signed)
  Subjective:    Patient ID: Kayla [name], female    DOB: [birth year], 76 y.o.   MRN: [id number]  Kayla [name] is a 76 y.o. female who presents for follow-up of Type 2 diabetes mellitus.  Patient is checking home blood sugars.   Home blood sugar records: BGs range between 117 and 120 How often is blood sugars being checked: everyday Current symptoms/problems include none and have been unchanged. Daily foot checks:yes   Any foot concerns: Reports dry skin on feet.  Last eye exam: 12/06/2016 Exercise: The patient does not participate in regular exercise at present.  She continues on her Glucophage and is having no difficulty with that. She is also taking Hyzaar and Coreg and his had no weakness numbness or tingling. She is on Lipitor for her lipids and having no muscle aches or pains. He does complain of swelling in her extremities and does tend to go away when she gets up in the morning. The following portions of the patient's history were reviewed and updated as appropriate: allergies, current medications, past medical history, past social history and problem list.  ROS as in subjective above.     Objective:    Physical Exam Alert and in no distress. 2+ pitting edema is noted in her lower extremities. Negative Homans sign. Skin appears normal.   Lab Review Diabetic Labs Latest Ref Rng & Units 11/25/2016 07/11/2016 03/06/2016 09/13/2015 01/04/2015  HbA1c - - 6.7 6.9 6.8 6.4  Chol 125 - 200 mg/dL - - 261(H) - 161  HDL >=46 mg/dL - - 63 - 53  Calc LDL <130 mg/dL - - 181(H) - 95  Triglycerides <150 mg/dL - - 85 - 65  Creatinine 0.44 - 1.00 mg/dL 0.80 - 0.75 - 0.73   BP/Weight 11/27/2016 11/25/2016 08/21/2016 08/10/2016 51/76/1607  Systolic BP 371 062 694 854 627  Diastolic BP 88 65 80 70 70  Wt. (Lbs) 225 233.38 227.2 224 229  BMI 38.62 40.06 35.58 35.08 35.87   Foot/eye exam completion dates Latest Ref Rng & Units 12/06/2016 12/02/2015  Eye Exam No Retinopathy No Retinopathy No Retinopathy   Foot Form Completion - - -  A1c is 6.5  Kayla [name]  reports that she has never smoked. She has never used smokeless tobacco. She reports that she does not drink alcohol or use drugs.     Assessment & Plan:    Type 2 diabetes mellitus with diabetic neuropathy, unspecified whether long term insulin use ([place]) - Plan: POCT UA - Microalbumin, HgB A1c  Dependent edema  Hyperlipidemia associated with type 2 diabetes mellitus ([place])  Hypertension associated with diabetes ([place])  Class 3 severe obesity due to excess calories without serious comorbidity with body mass index (BMI) of 45.0 to 49.9 in adult ([place])   1. Rx changes: none 2. Education: Reviewed 'ABCs' of diabetes management (respective goals in parentheses):  A1C (<7), blood pressure (<130/80), and cholesterol (LDL <100). 3. Compliance at present is estimated to be fair. Efforts to improve compliance (if necessary) will be directed at increased exercise. Encouraged her to get involved in water aerobics since she is having difficulty with her mobility. 4. Follow up: 4 months Recommend elevating her feet as much as possible during the day and to wear support hose when she gets up in the morning. Explained treating this with medication would not necessarily be appropriate.

## 2017-02-21 ENCOUNTER — Other Ambulatory Visit: Payer: Self-pay | Admitting: Family Medicine

## 2017-02-21 DIAGNOSIS — I152 Hypertension secondary to endocrine disorders: Secondary | ICD-10-CM

## 2017-02-21 DIAGNOSIS — E1159 Type 2 diabetes mellitus with other circulatory complications: Secondary | ICD-10-CM

## 2017-02-21 DIAGNOSIS — I10 Essential (primary) hypertension: Principal | ICD-10-CM

## 2017-03-03 ENCOUNTER — Other Ambulatory Visit: Payer: Self-pay | Admitting: Family Medicine

## 2017-03-14 ENCOUNTER — Ambulatory Visit (INDEPENDENT_AMBULATORY_CARE_PROVIDER_SITE_OTHER): Payer: Medicare Other | Admitting: Podiatry

## 2017-03-14 ENCOUNTER — Encounter: Payer: Self-pay | Admitting: Podiatry

## 2017-03-14 DIAGNOSIS — M79676 Pain in unspecified toe(s): Secondary | ICD-10-CM | POA: Diagnosis not present

## 2017-03-14 DIAGNOSIS — B351 Tinea unguium: Secondary | ICD-10-CM | POA: Diagnosis not present

## 2017-03-14 DIAGNOSIS — E1140 Type 2 diabetes mellitus with diabetic neuropathy, unspecified: Secondary | ICD-10-CM | POA: Diagnosis not present

## 2017-03-14 DIAGNOSIS — L851 Acquired keratosis [keratoderma] palmaris et plantaris: Secondary | ICD-10-CM

## 2017-03-14 DIAGNOSIS — L84 Corns and callosities: Secondary | ICD-10-CM

## 2017-03-14 NOTE — Progress Notes (Signed)
Patient ID: Kayla [name], female   DOB: 01/12/1941, 76 y.o.   MRN: [id number] Complaint:  Visit Type: Patient returns to my office for continued preventative foot care services. Complaint: Patient states" my nails have grown long and thick and become painful to walk and wear shoes" Patient has been diagnosed with DM with neuropathy.. He presents for preventative foot care services. No changes to ROS,  Heel calluses noted.  Podiatric Exam: Vascular: dorsalis pedis and posterior tibial pulses are palpable bilateral. Capillary return is immediate. Temperature gradient is WNL. Skin turgor WNL  Sensorium: Normal Semmes Weinstein monofilament test. Normal tactile sensation bilaterally. Nail Exam: Pt has thick disfigured discolored nails with subungual debris noted bilateral entire nail hallux through fifth toenails Ulcer Exam: There is no evidence of ulcer or pre-ulcerative changes or infection. Orthopedic Exam: Muscle tone and strength are WNL. No limitations in general ROM. No crepitus or effusions noted. Foot type and digits show no abnormalities. Bony prominences are unremarkable. Skin: No Porokeratosis. No infection or ulcers.  Heel calluses B/L.  Diagnosis:  Tinea unguium, Pain in right toe, pain in left toes,  Debridement of heel callus.  Treatment & Plan Procedures and Treatment: Consent by patient was obtained for treatment procedures. The patient understood the discussion of treatment and procedures well. All questions were answered thoroughly reviewed. Debridement of mycotic and hypertrophic toenails, 1 through 5 bilateral and clearing of subungual debris. No ulceration, no infection noted.  Heel callus was treated with dremel tool. Return Visit-Office Procedure: Patient instructed to return to the office for a follow up visit 3 months for continued evaluation and treatment.   Selena Swaminathan DPM 

## 2017-03-28 ENCOUNTER — Other Ambulatory Visit: Payer: Self-pay | Admitting: Family Medicine

## 2017-03-28 DIAGNOSIS — E1159 Type 2 diabetes mellitus with other circulatory complications: Secondary | ICD-10-CM

## 2017-03-28 DIAGNOSIS — I152 Hypertension secondary to endocrine disorders: Secondary | ICD-10-CM

## 2017-03-28 DIAGNOSIS — I10 Essential (primary) hypertension: Principal | ICD-10-CM

## 2017-05-02 ENCOUNTER — Telehealth: Payer: Self-pay | Admitting: Family Medicine

## 2017-05-02 MED ORDER — GLUCOSE BLOOD VI STRP: ORAL_STRIP | 5 refills | Status: DC

## 2017-05-02 NOTE — Telephone Encounter (Signed)
done

## 2017-05-02 NOTE — Telephone Encounter (Signed)
Pt requesting a script for One Touch Ultra test strips [facility] [place] so she can get these cheaper than over the counter.

## 2017-05-03 ENCOUNTER — Other Ambulatory Visit: Payer: Self-pay | Admitting: Family Medicine

## 2017-05-03 DIAGNOSIS — E1140 Type 2 diabetes mellitus with diabetic neuropathy, unspecified: Secondary | ICD-10-CM

## 2017-06-15 LAB — HM MAMMOGRAPHY

## 2017-06-19 ENCOUNTER — Encounter: Payer: Self-pay | Admitting: Family Medicine

## 2017-06-20 ENCOUNTER — Ambulatory Visit: Payer: Medicare Other | Admitting: Podiatry

## 2017-06-20 ENCOUNTER — Encounter: Payer: Self-pay | Admitting: Podiatry

## 2017-06-20 DIAGNOSIS — B351 Tinea unguium: Secondary | ICD-10-CM

## 2017-06-20 DIAGNOSIS — M79676 Pain in unspecified toe(s): Secondary | ICD-10-CM | POA: Diagnosis not present

## 2017-06-20 DIAGNOSIS — L851 Acquired keratosis [keratoderma] palmaris et plantaris: Secondary | ICD-10-CM

## 2017-06-20 DIAGNOSIS — E1140 Type 2 diabetes mellitus with diabetic neuropathy, unspecified: Secondary | ICD-10-CM

## 2017-06-20 NOTE — Progress Notes (Signed)
Patient ID: Kayla [name], female   DOB: [name], 1941, 76 y.o.   MRN: [id number] Complaint:  Visit Type: Patient returns to my office for continued preventative foot care services. Complaint: Patient states" my nails have grown long and thick and become painful to walk and wear shoes" Patient has been diagnosed with DM with neuropathy.[name] He presents for preventative foot care services. No changes to ROS,  Heel calluses noted.  Podiatric Exam: Vascular: dorsalis pedis and posterior tibial pulses are palpable bilateral. Capillary return is immediate. Temperature gradient is WNL. Skin turgor WNL  Sensorium: Normal Semmes Weinstein monofilament test. Normal tactile sensation bilaterally. Nail Exam: Pt has thick disfigured discolored nails with subungual debris noted bilateral entire nail hallux through fifth toenails Ulcer Exam: There is no evidence of ulcer or pre-ulcerative changes or infection. Orthopedic Exam: Muscle tone and strength are WNL. No limitations in general ROM. No crepitus or effusions noted. Foot type and digits show no abnormalities. Bony prominences are unremarkable. Skin: No Porokeratosis. No infection or ulcers.  Heel calluses B/L.  Diagnosis:  Tinea unguium, Pain in right toe, pain in left toes,  Debridement of heel callus.  Treatment & Plan Procedures and Treatment: Consent by patient was obtained for treatment procedures. The patient understood the discussion of treatment and procedures well. All questions were answered thoroughly reviewed. Debridement of mycotic and hypertrophic toenails, 1 through 5 bilateral and clearing of subungual debris. No ulceration, no infection noted.  Heel callus was treated with dremel tool. Return Visit-Office Procedure: Patient instructed to return to the office for a follow up visit 3 months for continued evaluation and treatment.   Gardiner Barefoot DPM

## 2017-06-26 ENCOUNTER — Encounter: Payer: Self-pay | Admitting: Family Medicine

## 2017-06-26 ENCOUNTER — Ambulatory Visit: Payer: Medicare Other | Admitting: Family Medicine

## 2017-06-26 VITALS — BP 150/70 | HR 78 | Temp 97.8°F | Resp 18 | Wt 233.0 lb

## 2017-06-26 DIAGNOSIS — J0100 Acute maxillary sinusitis, unspecified: Secondary | ICD-10-CM | POA: Diagnosis not present

## 2017-06-26 MED ORDER — AMOXICILLIN 875 MG PO TABS: 875.0000 mg | ORAL_TABLET | Freq: Two times a day (BID) | ORAL | 0 refills | Status: DC

## 2017-06-26 NOTE — Progress Notes (Signed)
   Subjective:    Patient ID: Kayla [name], female    DOB: March 11, 1941, 76 y.o.   MRN: [id number]  HPI She complains of a one-week history of rhinorrhea, sneezing, sinus pressure, PND but no fever, chills, earache, sore throat or coughing.  She does not smoke.   Review of Systems     Objective:   Physical Exam Alert and in no distress. Tympanic membranes and canals are normal. Pharyngeal area is normal. Neck is supple without adenopathy or thyromegaly. Cardiac exam shows a regular sinus rhythm without murmurs or gallops. Lungs are clear to auscultation.  Nasal mucosa is normal but she does have tenderness over her maxillary sinuses        Assessment & Plan:  Acute non-recurrent maxillary sinusitis - Plan: amoxicillin (AMOXIL) 875 MG tablet She is to take all the antibiotic and if not totally back to normal when she finishes, she will call.

## 2017-06-26 NOTE — Patient Instructions (Signed)
Take all the antibiotic and if not totally back to normal when you finish call me °

## 2017-06-30 ENCOUNTER — Other Ambulatory Visit: Payer: Self-pay | Admitting: Family Medicine

## 2017-07-17 ENCOUNTER — Telehealth: Payer: Self-pay | Admitting: Family Medicine

## 2017-07-17 MED ORDER — AMOXICILLIN-POT CLAVULANATE 875-125 MG PO TABS: 1.0000 | ORAL_TABLET | Freq: Two times a day (BID) | ORAL | 0 refills | Status: DC

## 2017-07-17 NOTE — Telephone Encounter (Signed)
Let her know that I called in a more potent version of the medicine she is on.  Have her let us know how she is doing in 10 days.

## 2017-07-17 NOTE — Telephone Encounter (Signed)
Pt called and states that she is still not feeling good she is only about 60 percent better, she states she is still having some light greenish mucos coming up, she is stll having a hard time getting it to come up, she is still having some sinus headaches coming and going, pt finished her antibiotic pt wants to know what else she can do now pt [facility]  [id number] [place] [place] [facility] [address] pt can be reached at [phone]

## 2017-07-18 NOTE — Telephone Encounter (Signed)
Pt informed. Kayla [name]

## 2017-08-02 ENCOUNTER — Other Ambulatory Visit: Payer: Self-pay | Admitting: Family Medicine

## 2017-08-02 DIAGNOSIS — E1140 Type 2 diabetes mellitus with diabetic neuropathy, unspecified: Secondary | ICD-10-CM

## 2017-08-02 NOTE — Telephone Encounter (Signed)
Pt has appt for 08/02/2017. Kayla [name]

## 2017-08-03 ENCOUNTER — Ambulatory Visit: Payer: Medicare Other | Admitting: Family Medicine

## 2017-08-03 ENCOUNTER — Encounter: Payer: Self-pay | Admitting: Family Medicine

## 2017-08-03 VITALS — BP 134/72 | HR 78 | Wt 232.8 lb

## 2017-08-03 DIAGNOSIS — E785 Hyperlipidemia, unspecified: Secondary | ICD-10-CM

## 2017-08-03 DIAGNOSIS — E1159 Type 2 diabetes mellitus with other circulatory complications: Secondary | ICD-10-CM | POA: Diagnosis not present

## 2017-08-03 DIAGNOSIS — I10 Essential (primary) hypertension: Secondary | ICD-10-CM | POA: Diagnosis not present

## 2017-08-03 DIAGNOSIS — E1169 Type 2 diabetes mellitus with other specified complication: Secondary | ICD-10-CM | POA: Diagnosis not present

## 2017-08-03 DIAGNOSIS — E118 Type 2 diabetes mellitus with unspecified complications: Secondary | ICD-10-CM | POA: Diagnosis not present

## 2017-08-03 DIAGNOSIS — R296 Repeated falls: Secondary | ICD-10-CM

## 2017-08-03 DIAGNOSIS — I152 Hypertension secondary to endocrine disorders: Secondary | ICD-10-CM

## 2017-08-03 DIAGNOSIS — Z6841 Body Mass Index (BMI) 40.0 and over, adult: Secondary | ICD-10-CM

## 2017-08-03 DIAGNOSIS — J0100 Acute maxillary sinusitis, unspecified: Secondary | ICD-10-CM | POA: Diagnosis not present

## 2017-08-03 NOTE — Progress Notes (Signed)
  Subjective:    Patient ID: Kayla [name], female    DOB: [birth year], 77 y.o.   MRN: [id number]  Kayla [name] is a 77 y.o. female who presents for follow-up of Type 2 diabetes mellitus.  Patient is checking home blood sugars.   Home blood sugar records: 100-150 How often is blood sugars being checked: once daily Current symptoms/problems include hyperglycemia issues  Daily foot checks: yes   Any foot concerns: ankle and top of foot painful Last eye exam: 2018 Exercise: no, tries to move legs around some. She continues on metformin and doing well on that.  She is also taking Hyzaar and Coreg for her blood pressure as well as atorvastatin.  She is having no difficulty with that.  She does see orthopedics for continued difficulty with foot pain.  She is also had some trouble with sinusitis and recently finished a course of Augmentin however at this point she is not interested in being placed on any more medication even though she is still having some symptoms. The following portions of the patient's history were reviewed and updated as appropriate: allergies, current medications, past medical history, past social history and problem list.  ROS as in subjective above.     Objective:    Physical Exam Alert and in no distress otherwise not examined.   Lab Review Diabetic Labs Latest Ref Rng & Units 01/01/2017 11/25/2016 07/11/2016 03/06/2016 09/13/2015  HbA1c - 6.5 - 6.7 6.9 6.8  Chol 125 - 200 mg/dL - - - 261(H) -  HDL >=46 mg/dL - - - 63 -  Calc LDL <130 mg/dL - - - 181(H) -  Triglycerides <150 mg/dL - - - 85 -  Creatinine 0.44 - 1.00 mg/dL - 0.80 - 0.75 -   BP/Weight 06/26/2017 11/27/2016 11/25/2016 08/21/2016 12/08/2583  Systolic BP 277 824 235 361 443  Diastolic BP 70 88 65 80 70  Wt. (Lbs) 233 225 233.38 227.2 224  BMI 39.99 38.62 40.06 35.58 35.08   Foot/eye exam completion dates Latest Ref Rng & Units 12/06/2016 12/02/2015  Eye Exam No Retinopathy No Retinopathy No Retinopathy  Foot  Form Completion - - -  A1c is 6.9  Vanilla  reports that  has never smoked. she has never used smokeless tobacco. She reports that she does not drink alcohol or use drugs.     Assessment & Plan:    Type 2 diabetes mellitus with complication, without long-term current use of insulin ([facility])  Multiple falls  Hyperlipidemia associated with type 2 diabetes mellitus ([place])  Hypertension associated with diabetes ([place])  Class 3 severe obesity due to excess calories without serious comorbidity with body mass index (BMI) of 45.0 to 49.9 in adult [facility])  Acute non-recurrent maxillary sinusitis   1. Rx changes: none 2. Education: Reviewed 'ABCs' of diabetes management (respective goals in parentheses):  A1C (<7), blood pressure (<130/80), and cholesterol (LDL <100). 3. Compliance at present is estimated to be fair. Efforts to improve compliance (if necessary) will be directed at increased exercise. 4. Follow up: 4 months Offered a flu shot today however she is not interested. Did recommend she get the shingles vaccine as well as TDaP

## 2017-09-10 ENCOUNTER — Ambulatory Visit: Payer: Medicare Other | Admitting: Family Medicine

## 2017-09-10 ENCOUNTER — Encounter: Payer: Self-pay | Admitting: Family Medicine

## 2017-09-10 VITALS — BP 142/78 | HR 86 | Wt 232.8 lb

## 2017-09-10 DIAGNOSIS — Z9181 History of falling: Secondary | ICD-10-CM

## 2017-09-10 DIAGNOSIS — S40022A Contusion of left upper arm, initial encounter: Secondary | ICD-10-CM

## 2017-09-10 DIAGNOSIS — S0081XA Abrasion of other part of head, initial encounter: Secondary | ICD-10-CM

## 2017-09-10 NOTE — Progress Notes (Signed)
   Subjective:    Patient ID: Kayla [name], female    DOB: 09/05/1940, 77 y.o.   MRN: [id number]  HPI She fell approximately a week ago at her home while she was attempting to go up steps.  She sustained an abrasion  to the left side of her face as well as her left arm.  She does complain of some right arm tenderness with motion.  She did not lose consciousness and did not go to an emergency room.   Review of Systems     Objective:   Physical Exam Alert and in no distress.  Healing abrasion noted to the left temporal area.  I shows no swelling discoloration.  Cornea and conjunctivae are normal.  Left arm does show a ecchymotic area just distal to the shoulder with normal motion of the shoulder.  No palpable tenderness.  Right arm exam shows full motion with no tenderness to the arm or clavicular area.       Assessment & Plan:  History of recent fall  Arm contusion, left, initial encounter  Abrasion of face, initial encounter  Recommend supportive care with heat to the left arm 20 minutes 3 times per day and Tylenol for pain.  She was comfortable with that.

## 2017-09-10 NOTE — Patient Instructions (Signed)
Heat for 20 minutes 3 times per day for your arm.  You can take 2 Tylenol 4 times per day as needed

## 2017-09-12 ENCOUNTER — Encounter: Payer: Self-pay | Admitting: Podiatry

## 2017-09-12 ENCOUNTER — Ambulatory Visit: Payer: Medicare Other | Admitting: Podiatry

## 2017-09-12 DIAGNOSIS — M79676 Pain in unspecified toe(s): Secondary | ICD-10-CM

## 2017-09-12 DIAGNOSIS — B351 Tinea unguium: Secondary | ICD-10-CM | POA: Diagnosis not present

## 2017-09-12 DIAGNOSIS — L851 Acquired keratosis [keratoderma] palmaris et plantaris: Secondary | ICD-10-CM

## 2017-09-12 DIAGNOSIS — E1140 Type 2 diabetes mellitus with diabetic neuropathy, unspecified: Secondary | ICD-10-CM

## 2017-09-12 NOTE — Progress Notes (Signed)
Patient ID: Kayla [name], female   DOB: [birth year], 77 y.o.   MRN: [id number] Complaint:  Visit Type: Patient returns to my office for continued preventative foot care services. Complaint: Patient states" my nails have grown long and thick and become painful to walk and wear shoes" Patient has been diagnosed with DM with neuropathy.[name] He presents for preventative foot care services. No changes to ROS,  Heel calluses noted.  Podiatric Exam: Vascular: dorsalis pedis and posterior tibial pulses are palpable bilateral. Capillary return is immediate. Temperature gradient is WNL. Skin turgor WNL  Sensorium: Normal Semmes Weinstein monofilament test. Normal tactile sensation bilaterally. Nail Exam: Pt has thick disfigured discolored nails with subungual debris noted bilateral entire nail hallux through fifth toenails Ulcer Exam: There is no evidence of ulcer or pre-ulcerative changes or infection. Orthopedic Exam: Muscle tone and strength are WNL. No limitations in general ROM. No crepitus or effusions noted. Foot type and digits show no abnormalities. Bony prominences are unremarkable. Skin: No Porokeratosis. No infection or ulcers.  Heel calluses B/L.  Diagnosis:  Tinea unguium, Pain in right toe, pain in left toes,  Debridement of heel callus.  Treatment & Plan Procedures and Treatment: Consent by patient was obtained for treatment procedures. The patient understood the discussion of treatment and procedures well. All questions were answered thoroughly reviewed. Debridement of mycotic and hypertrophic toenails, 1 through 5 bilateral and clearing of subungual debris. No ulceration, no infection noted.  Heel callus was treated with dremel tool. Return Visit-Office Procedure: Patient instructed to return to the office for a follow up visit 3 months for continued evaluation and treatment.   Gardiner Barefoot DPM

## 2017-10-16 ENCOUNTER — Other Ambulatory Visit: Payer: Self-pay

## 2017-10-16 ENCOUNTER — Telehealth: Payer: Self-pay | Admitting: Family Medicine

## 2017-10-16 DIAGNOSIS — I152 Hypertension secondary to endocrine disorders: Secondary | ICD-10-CM

## 2017-10-16 DIAGNOSIS — I10 Essential (primary) hypertension: Principal | ICD-10-CM

## 2017-10-16 DIAGNOSIS — E1159 Type 2 diabetes mellitus with other circulatory complications: Secondary | ICD-10-CM

## 2017-10-16 NOTE — Telephone Encounter (Signed)
Pt requesting a referral to see heart & vascular doctor, Dr Johny Chess, to have her heart rechecked for occasional slow heart beat. Pt said she has seen Dr Johny Chess in the past for heart issues and would like to go to him instead of coming here for heart issues first. She did not want to make an appointment here when offered because she did not wan to pay 2 copays for this issue.

## 2017-10-16 NOTE — Telephone Encounter (Signed)
Referral was put in  

## 2017-10-16 NOTE — Telephone Encounter (Signed)
Ok

## 2017-10-24 ENCOUNTER — Other Ambulatory Visit: Payer: Self-pay | Admitting: Family Medicine

## 2017-10-24 DIAGNOSIS — E1140 Type 2 diabetes mellitus with diabetic neuropathy, unspecified: Secondary | ICD-10-CM

## 2017-11-02 ENCOUNTER — Ambulatory Visit: Payer: Medicare Other | Admitting: Podiatry

## 2017-11-02 ENCOUNTER — Ambulatory Visit: Payer: Self-pay

## 2017-11-02 ENCOUNTER — Encounter: Payer: Self-pay | Admitting: Podiatry

## 2017-11-02 DIAGNOSIS — M25572 Pain in left ankle and joints of left foot: Secondary | ICD-10-CM

## 2017-11-02 DIAGNOSIS — R52 Pain, unspecified: Secondary | ICD-10-CM

## 2017-11-02 NOTE — Progress Notes (Signed)
This patient presents to the office stating that she is starting to have left ankle pain.  She says her ankle appears to be painful and spasming.  She says she was seen by Dr. Gershon Mussel and he took xrays.  She presents to this office and was brought to the x-ray room and she refused to allow her ankle to be x-rayed.  She says the en at Dr. Lindley Magnus office.  Again, he explained that she needs to acquire those xrays from Dr. Gershon Mussel prior to treatment .  She says she d she will be reappointed to this office at a later date.  Gardiner Barefoot DPM

## 2017-11-06 ENCOUNTER — Encounter: Payer: Self-pay | Admitting: Podiatry

## 2017-11-06 ENCOUNTER — Ambulatory Visit (INDEPENDENT_AMBULATORY_CARE_PROVIDER_SITE_OTHER): Payer: Medicare Other | Admitting: Podiatry

## 2017-11-06 DIAGNOSIS — E1142 Type 2 diabetes mellitus with diabetic polyneuropathy: Secondary | ICD-10-CM

## 2017-11-06 DIAGNOSIS — M129 Arthropathy, unspecified: Secondary | ICD-10-CM

## 2017-11-06 NOTE — Progress Notes (Addendum)
This patient presents the office stating that she is experiencing left ankle pain.  She says her ankle is painful as she walks.  She says she has been seen by Dr. Gershon Mussel who took x-rays and told her she was having ankle problems.  He proceeded to treat her with injection therapy and recommended she present to this  office today for a permanent brace.  She presents the office today bringing the x-rays of her left foot and ankle taken by Dr. Gershon Mussel.  She desires to discuss obtaining a brace for her left ankle pain.  She also presents wearing a brace she received from [employer]  that she says has been ineffective at controlling her painful ankle.  She also states that she is not interested in any surgical intervention.    General Appearance  Alert, conversant and in no acute stress.  Vascular  Dorsalis pedis and posterior tibial  pulses are palpable  bilaterally.  Capillary return is within normal limits  bilaterally. Temperature is within normal limits  bilaterally.  Neurologic  Senn-Weinstein monofilament wire test within normal limits  bilaterally. Muscle power within normal limits bilaterally.  Nails Thick disfigured discolored nails with subungual debris  from hallux to fifth toes bilaterally. No evidence of bacterial infection or drainage bilaterally.  Orthopedic  No limitations of motion of motion feet .  No crepitus or effusions noted.   No evidence of forefoot pathology left foot.  There is a medial prominence noted at the talonavicular joint left foot as well as and he converted heel left foot.  This chronic arthritis is in  a fixed position with limited range of motion through the subtalar joint. Normal dorsiflexion and plantarflexion is noted through the ankle joint, left foot.  Sinus tarsal pain upon palpation.    Skin  normotropic skin with no porokeratosis noted bilaterally.  No signs of infections or ulcers noted.   Chronic rearfoot arthritis  STJ left foot.    Sinus tarsitis left foot.    ROV.     [name] X-rays were reviewed and there is  no arthritis noted in the talocrural joint left foot.  (ankle)  . There is significant arthritis in the talonavicular and calcaneal cuboid joint, left. There is calcification at the insertion plantar fascia and achilles tendon left foot.  Midfoot arthritis is noted dorsally.  There is a severe plantarflexed talus left ankle.  This possibly could have been a previous Charcot foot.  . I took time to explain that she is not having any arthritis to her left ankle.  Her arthritis is in her rear foot complex, especially the subtalar joint, left foot.  The arthritis has become a fixed deformity and I told this patient that a brace would not be helpful since there is no motion in her rear foot complex.  I then recommended an injection through the sinus tarsi .  . I also suggested that if she wants to discuss wearing a brace to make an appointment with the pedorthist at her next visit.  .  She is to return to the office for her left foot pain as needed.   Gardiner Barefoot DPM   Addendum  This patient proved to be a difficult patient.  She had come to the office last week for scheduled appointment and refused to have x-rays taken since the xrays  were at Dr. Lindley Magnus office already.  She presents the office today with the x-rays which  were reviewed by myself.  The patient then requested  that she brought to the treatment room in a wheelchair.   She then said that she needed to have juice and crackers since she was feeling weak.  I explained to her that her ankle joint was not her problem but her foot had significant arthritis.  She argued with me that her ankle was the problem.  I even brought the xrays on the computer  into the room to show this patient that her rearfoot was the problem.  She seemed upset with me that my diagnosis was different from Dr. Lindley Magnus diagnosis.  I then  offered to have her be seen by another doctor. . She then questioned me why I was upset.  . I told her again  that her pain was in her foot and not her ankle.  She had more confidence in Dr. Lindley Magnus diagnosis, than my diagnosis. I then told her that since her rearfoot was in a fixed skew foot deformity that brace would not be effective.   I then offered her an appointment with a pedorthist which she was hesitant due to an additional copay.  Finally I suggested injection therapy in her sinus tarsi to help reduce her left rearfoot pain which she accepted. She was told that she could return for her left foot pain in the future and we can have her see the pedorthist at that visit.    Gardiner Barefoot DPM

## 2017-11-07 ENCOUNTER — Ambulatory Visit (INDEPENDENT_AMBULATORY_CARE_PROVIDER_SITE_OTHER): Payer: Medicare Other | Admitting: Adult Health

## 2017-11-07 ENCOUNTER — Encounter: Payer: Self-pay | Admitting: Adult Health

## 2017-11-07 VITALS — BP 162/86 | HR 76 | Ht 64.0 in | Wt 231.0 lb

## 2017-11-07 DIAGNOSIS — R142 Eructation: Secondary | ICD-10-CM | POA: Diagnosis not present

## 2017-11-07 DIAGNOSIS — R079 Chest pain, unspecified: Secondary | ICD-10-CM

## 2017-11-07 DIAGNOSIS — R0602 Shortness of breath: Secondary | ICD-10-CM

## 2017-11-07 DIAGNOSIS — Z86711 Personal history of pulmonary embolism: Secondary | ICD-10-CM | POA: Diagnosis not present

## 2017-11-07 DIAGNOSIS — I10 Essential (primary) hypertension: Secondary | ICD-10-CM | POA: Diagnosis not present

## 2017-11-07 DIAGNOSIS — E1159 Type 2 diabetes mellitus with other circulatory complications: Secondary | ICD-10-CM | POA: Diagnosis not present

## 2017-11-07 DIAGNOSIS — R0789 Other chest pain: Secondary | ICD-10-CM | POA: Diagnosis not present

## 2017-11-07 DIAGNOSIS — I152 Hypertension secondary to endocrine disorders: Secondary | ICD-10-CM

## 2017-11-07 MED ORDER — PANTOPRAZOLE SODIUM 20 MG PO TBEC: 20.0000 mg | DELAYED_RELEASE_TABLET | Freq: Every day | ORAL | 2 refills | Status: DC

## 2017-11-07 NOTE — Patient Instructions (Signed)
Medication Instructions:  START PROTONIX 20MG  DAILY  If you need a refill on your cardiac medications before your next appointment, please call your pharmacy.  Testing/Procedures: Echocardiogram - Your physician has requested that you have an echocardiogram. Echocardiography is a painless test that uses sound waves to create images of your heart. It provides your doctor with information about the size and shape of your heart and how well your heart's chambers and valves are working. This procedure takes approximately one hour. There are no restrictions for this procedure. This will be performed at our [facility] location - [address].  Your physician has requested that you have a lexiscan myoview. A cardiac stress test is a cardiological test that measures the heart's ability to respond to external stress in a controlled clinical environment. The stress response is induced byintravenous pharmacological stimulation. For further information please visit [employer]. Please follow instructions below. How to prepare for your Myocardial Perfusion Test:   Do not eat or drink 3 hours prior to your test, except you may have water.  Do not consume products containing caffeine (regular or decaffeinated) 12 hours prior to your test. (ex: coffee, chocolate, sodas, tea).  Do wear comfortable clothes (no dresses or overalls) and walking shoes, tennis shoes preferred (No heels or open toe shoes are allowed).  Do NOT wear cologne, perfume, aftershave, or lotions (deodorant is allowed).  If these instructions are not followed, your test will have to be rescheduled.  If you have questions or concerns about your appointment, you can call the Nuclear Lab at [phone].   Follow-Up: Your physician wants you to follow-up in: [place] ([place]), DNP.  Thank you for choosing [facility] at [facility]!!

## 2017-11-07 NOTE — Progress Notes (Signed)
24 cardiology Office Note   Date:  11/07/2017   ID:  Kayla [name], DOB 12/30/1940, MRN [id number]  PCP:  Denita Lung, MD  Cardiologist: Dr. Ellyn Hack Chief Complaint  Patient presents with  . Follow-up  . Hypertension     History of Present Illness: Kayla [name] is a 77 y.o. female who presents for ongoing assessment and management of acute pulmonary embolism diagnosed in May 2012, was placed on Coumadin anticoagulation therapy.  Other history includes diabetes, anemia on iron replacement therapy, and bradycardia.  She has not been seen by cardiology since 2012 hospitalization.    She is also being followed by Dr. Gardiner Barefoot for pain in her left ankle.  She had been followed by "Dr. Gershon Mussel" who gave her steroid injections, and she was to follow-up with orthopedic physician for ankle brace.  She was found to have significant arthritis of the left ankle and foot.  She has been experiencing some dyspnea, some heartburn with pressure when she lies down, with frequent burping.  Her energy level has decreased somewhat she is chocking up to her age.  She also is not as active due to pain in her left ankle.  She denies any new diagnoses, medications, or allergies.  She has not had any invasive surgeries.  Past Medical History:  Diagnosis Date  . Arthritis   . Diabetes mellitus   . Hypertension   . Obesity   . Pulmonary embolus [facility])     Past Surgical History:  Procedure Laterality Date  . ABDOMINAL HYSTERECTOMY    . CHOLECYSTECTOMY    . JOINT REPLACEMENT  Right     Current Outpatient Medications  Medication Sig Dispense Refill  . aspirin EC 325 MG tablet USE PER THE INSTRUCTIONS ON PACKAGE LABEL 100 tablet 3  . carvedilol (COREG) 3.125 MG tablet TAKE 1 TABLET BY MOUTH TWICE A DAY 180 tablet 3  . glucose blood (ONE TOUCH ULTRA TEST) test strip Test once a day (THIS IS FOR ONE TOUCH ULTRA ) 100 each 5  . losartan-hydrochlorothiazide (HYZAAR) 50-12.5 MG tablet TAKE 1 TABLET BY  MOUTH DAILY. 90 tablet 2  . metFORMIN (GLUCOPHAGE) 500 MG tablet TAKE 1 TABLET BY MOUTH TWO TIMES DAILY WITH MEALS 180 tablet 0  . Multiple Vitamins-Minerals (MULTIVITAMIN WITH MINERALS) tablet Take 1 tablet by mouth daily.    . pantoprazole (PROTONIX) 20 MG tablet Take 1 tablet (20 mg total) by mouth daily. 30 tablet 2   No current facility-administered medications for this visit.     Allergies:   Patient has no known allergies.    Social History:  The patient  reports that she has never smoked. She has never used smokeless tobacco. She reports that she does not drink alcohol or use drugs.   Family History:  The patient's family history includes Hypertension in her mother.    ROS: All other systems are reviewed and negative. Unless otherwise mentioned in H&P    PHYSICAL EXAM: VS:  BP (!) 162/86 (BP Location: Left Arm)   Pulse 76   Ht 5\' 4"  (1.626 m)   Wt 231 lb (104.8 kg)   BMI 39.65 kg/m  , BMI Body mass index is 39.65 kg/m. GEN: Well nourished, well developed, in no acute distress obese HEENT: normal  Neck: no JVD, carotid bruits, or masses Cardiac: RRR; no murmurs, rubs, or gallops,no edema  Respiratory:  Clear to auscultation bilaterally, normal work of breathing GI: soft, nontender, nondistended, + BS MS: no deformity or  atrophy  Skin: warm and dry, no rash Neuro:  Strength and sensation are intact Psych: euthymic mood, full affect   EKG: Normal sinus rhythm heart rate of 76 bpm with left atrial enlargement and LVH.  Recent Labs: 11/25/2016: BUN 15; Creatinine, Ser 0.80; Hemoglobin 9.9; Platelets 252; Potassium 3.7; Sodium 139 11/27/2016: TSH 0.92    Lipid Panel    Component Value Date/Time   CHOL 261 (H) 03/06/2016 1110   TRIG 85 03/06/2016 1110   HDL 63 03/06/2016 1110   CHOLHDL 4.1 03/06/2016 1110   VLDL 17 03/06/2016 1110   LDLCALC 181 (H) 03/06/2016 1110      Wt Readings from Last 3 Encounters:  11/07/17 231 lb (104.8 kg)  09/10/17 232 lb 12.8 oz  (105.6 kg)  08/03/17 232 lb 12.8 oz (105.6 kg)      Other studies Reviewed: Echocardiogram was completed on 11/20/2011 however results are not identifiable in the chart only that it was completed.  ASSESSMENT AND PLAN:  1.  Dyspnea with some chest pressure: I will plan a Lexiscan Myoview for diagnostic prognostic purposes with multiple cardiovascular risk factors to include hypertension, diabetes, age, hypercholesterolemia (not taking statin therapy).  I have explained this test to the patient who verbalizes understanding and is willing to proceed.  2.  Hypertension: Blood pressure is elevated today.  However she had to walk a long way to the clinic room.  She was also feeling as if she was going to be late.  I reviewed her past blood pressures which were significantly lower, one low [id number] systolic.  I am therefore not going to change any medications at this time.  We will follow-up with this during her stress test and on next appointment.  In the interim I will be checking echocardiogram for changes in LV function, and worsening hypertrophy based upon EKG.  3.  Diabetes: Patient is on metformin twice daily and does test her blood glucose at home.  4.  Hypercholesterolemia: She states she is intolerant to statin therapy and will not take any Lipitor or other medications to reduce cholesterol.  Most recent lipid study in August 2017 revealed a cholesterol of 261, LDL of 101, HDL of 63 with triglycerides of 85.  The patient will need follow-up labs to identify worsening hypercholesterolemia and hyperlipidemia.   5.  Arthritis of the left foot and ankle: She is being followed by "Dr. Gershon Mussel" and has been referred to orthopedics who have placed a brace on her foot.  6. Frequent burping and some GERD symptoms: I will start her on low dose PPI, protonix 20 mg daily.   Current medicines are reviewed at length with the patient today.    Labs/ tests ordered today include: Lexiscan Myoview,  echocardiogram.  Phill Myron. West Pugh, ANP, AACC   11/07/2017 4:13 PM    [facility] [address], [place], [place] [address] Phone: [phone]; Fax: [phone]

## 2017-11-08 ENCOUNTER — Telehealth: Payer: Self-pay | Admitting: Adult Health

## 2017-11-08 NOTE — Telephone Encounter (Signed)
Staff message sent to scheduling to cb pt to rsc

## 2017-11-08 NOTE — Telephone Encounter (Signed)
Returned call to patient, discussed stress test and patient is aware.    Patient states she needs to reschedule stress test because she is going out of town and needs to reschedule after May 12th.   She needs to call her son to see when he is coming into town to help with transportation.  Patient states she will find out the dates and call back to request to reschedule this.

## 2017-11-08 NOTE — Telephone Encounter (Signed)
New Message:     Please have Dr Purcell Nails call the patient.She wants to get more details and a better understanding about the Stress Test.She said she did not get everything about it on yesterday,she was concerned about her ride home.

## 2017-11-15 ENCOUNTER — Other Ambulatory Visit: Payer: Self-pay

## 2017-11-15 ENCOUNTER — Ambulatory Visit (HOSPITAL_COMMUNITY): Payer: Medicare Other | Attending: Cardiology

## 2017-11-15 DIAGNOSIS — Z86711 Personal history of pulmonary embolism: Secondary | ICD-10-CM

## 2017-11-15 DIAGNOSIS — R079 Chest pain, unspecified: Secondary | ICD-10-CM | POA: Insufficient documentation

## 2017-11-15 DIAGNOSIS — E1159 Type 2 diabetes mellitus with other circulatory complications: Secondary | ICD-10-CM | POA: Diagnosis present

## 2017-11-15 DIAGNOSIS — I10 Essential (primary) hypertension: Secondary | ICD-10-CM | POA: Insufficient documentation

## 2017-11-15 DIAGNOSIS — R142 Eructation: Secondary | ICD-10-CM

## 2017-11-15 DIAGNOSIS — R0602 Shortness of breath: Secondary | ICD-10-CM

## 2017-11-15 DIAGNOSIS — I152 Hypertension secondary to endocrine disorders: Secondary | ICD-10-CM

## 2017-11-15 DIAGNOSIS — R0789 Other chest pain: Secondary | ICD-10-CM

## 2017-11-20 ENCOUNTER — Ambulatory Visit (HOSPITAL_COMMUNITY): Payer: Medicare Other

## 2017-11-21 ENCOUNTER — Ambulatory Visit (HOSPITAL_COMMUNITY): Payer: Medicare Other

## 2017-11-29 ENCOUNTER — Other Ambulatory Visit: Payer: Self-pay | Admitting: Family Medicine

## 2017-11-29 ENCOUNTER — Telehealth: Payer: Self-pay | Admitting: Adult Health

## 2017-11-29 DIAGNOSIS — I152 Hypertension secondary to endocrine disorders: Secondary | ICD-10-CM

## 2017-11-29 DIAGNOSIS — E1159 Type 2 diabetes mellitus with other circulatory complications: Secondary | ICD-10-CM

## 2017-11-29 DIAGNOSIS — I10 Essential (primary) hypertension: Principal | ICD-10-CM

## 2017-11-29 NOTE — Telephone Encounter (Signed)
Do not need this encounter °

## 2017-12-12 ENCOUNTER — Encounter: Payer: Self-pay | Admitting: Adult Health

## 2017-12-13 ENCOUNTER — Ambulatory Visit (INDEPENDENT_AMBULATORY_CARE_PROVIDER_SITE_OTHER): Payer: Medicare Other | Admitting: Adult Health

## 2017-12-13 ENCOUNTER — Encounter: Payer: Self-pay | Admitting: Adult Health

## 2017-12-13 VITALS — BP 142/80 | HR 108 | Ht 64.0 in | Wt 234.3 lb

## 2017-12-13 DIAGNOSIS — E1159 Type 2 diabetes mellitus with other circulatory complications: Secondary | ICD-10-CM | POA: Diagnosis not present

## 2017-12-13 DIAGNOSIS — I10 Essential (primary) hypertension: Secondary | ICD-10-CM

## 2017-12-13 DIAGNOSIS — R0602 Shortness of breath: Secondary | ICD-10-CM | POA: Diagnosis not present

## 2017-12-13 NOTE — Patient Instructions (Signed)
Special Instructions: CALL AND RE-SCHEDULE LEXISCAN   Follow-Up: Your physician wants you to in:  [place] ([place]), DNP,AACC IF PRIMARY CARDIOLOGIST IS UNAVAILABLE.    Nuclear Medicine Exam A nuclear medicine exam is a safe and painless imaging test. It helps your health care provider detect and diagnose disease in the body. It also provides information about the way your organs work and how they are structured. For a nuclear medicine exam, you will be given a radioactive tracer. This substance is absorbed by your body's organs. A large scanning machine detects the tracer and creates pictures of the areas that your health care provider wants to look at. There are several kinds of nuclear medicine exams. They include:  CT scan.  MRI.  PET scan.  Tell a health care provider about:  Any allergies you have.  All medicines you are taking, including vitamins, herbs, eye drops, creams, and over-the-counter medicines.  Any problems you or family members have had with anesthetic medicines.  Any blood disorders you have.  Any surgeries you have had.  Any medical conditions you have.  Whether you are pregnant or may be pregnant.  Whether you are nursing. What happens before the procedure?  Ask your health care provider about changing or stopping your regular medicines.  Follow instructions from your health care provider about eating or drinking restrictions.  Do not wear jewelry.  Wear loose, comfortable clothing. You may be asked to wear a hospital gown for the procedure.  Bring previous imaging studies, such as X-rays, with you to the exam if they are available. What happens during the procedure?  An IV tube may be inserted into one of your veins.  You will be asked to lie on a table or sit in a chair.  You will be given the radioactive tracer. You may get: ? A pill or liquid to swallow. ? An injection. ? Medicine through  your IV tube. ? A gas to inhale.  A large scanning machine will be used to create images of your body. After the pictures are taken, you may have to wait so your health care provider can make sure that enough good images were taken. The procedure may vary among health care providers and hospitals. What happens after the procedure?  You may go right home after the procedure and return to your usual activities, unless told otherwise by your health care provider.  Drink enough water to keep urine clear or pale yellow. This helps to flush the radioactive tracer out of your body.  It is your responsibility to get your test results. Ask your health care provider or the department performing the test when your results will be ready.  Seek immediate medical care if you have shortness of breath. This information is not intended to replace advice given to you by your health care provider. Make sure you discuss any questions you have with your health care provider. Document Released: 08/24/2004 Document Revised: 03/16/2016 Document Reviewed: 02/03/2015 Elsevier Interactive Patient Education  [employer].

## 2017-12-13 NOTE — Progress Notes (Signed)
Cardiology Office Note   Date:  12/13/2017   ID:  Kayla [name], DOB May 23, 1941, MRN [id number]  PCP:  Denita Lung, MD  Cardiologist:  Dr. Ellyn Hack  Chief Complaint  Patient presents with  . Shortness of Breath     History of Present Illness: Kayla [name] is a 77 y.o. female who presents for ongoing assessment and management of bradycardia HTN, hx of PE no longer on coumadin, with chronic dyspnea. When seen last, due to worsening symptoms and CVRF she was scheduled for a Lexiscan Myoview and echocardiogram. She was started on PPI for frequent GERD symptoms and burping.   Echocardiogram demonstrated normal LVEF with Grade I diastolic dysfunction, trivial MR,normal RV size, mild LVH. No significant valvular abnormality. Stress test was not completed as patient had to go out of town, the patient is reluctant to proceed with stress test at this time.  She denies any chest discomfort, but continues to have dyspnea on exertion.  The patient does have obesity with arthritis in her knees and her ankles which slows her gait and decreases her stamina.  The patient is not adhering to a low-sodium diet at this time.   Past Medical History:  Diagnosis Date  . Arthritis   . Diabetes mellitus   . Hypertension   . Obesity   . Pulmonary embolus [facility])     Past Surgical History:  Procedure Laterality Date  . ABDOMINAL HYSTERECTOMY    . CHOLECYSTECTOMY    . JOINT REPLACEMENT  Right     Current Outpatient Medications  Medication Sig Dispense Refill  . aspirin EC 325 MG tablet USE PER THE INSTRUCTIONS ON PACKAGE LABEL 100 tablet 3  . carvedilol (COREG) 3.125 MG tablet TAKE 1 TABLET BY MOUTH TWICE A DAY 180 tablet 3  . glucose blood (ONE TOUCH ULTRA TEST) test strip Test once a day (THIS IS FOR ONE TOUCH ULTRA ) 100 each 5  . losartan-hydrochlorothiazide (HYZAAR) 50-12.5 MG tablet TAKE 1 TABLET BY MOUTH DAILY. 90 tablet 2  . metFORMIN (GLUCOPHAGE) 500 MG tablet TAKE 1 TABLET BY MOUTH TWO  TIMES DAILY WITH MEALS 180 tablet 0  . Multiple Vitamins-Minerals (MULTIVITAMIN WITH MINERALS) tablet Take 1 tablet by mouth daily.    . pantoprazole (PROTONIX) 20 MG tablet Take 1 tablet (20 mg total) by mouth daily. 30 tablet 2   No current facility-administered medications for this visit.     Allergies:   Patient has no known allergies.    Social History:  The patient  reports that she has never smoked. She has never used smokeless tobacco. She reports that she does not drink alcohol or use drugs.   Family History:  The patient's family history includes Hypertension in her mother.    ROS: All other systems are reviewed and negative. Unless otherwise mentioned in H&P    PHYSICAL EXAM: VS:  BP (!) 142/80 (BP Location: Right Arm)   Pulse (!) 108   Ht 5\' 4"  (1.626 m)   Wt 234 lb 4.8 oz (106.3 kg)   SpO2 97%   BMI 40.22 kg/m  , BMI Body mass index is 40.22 kg/m. GEN: Well nourished, well developed, in no acute distress obese HEENT: normal  Neck: no JVD, carotid bruits, or masses Cardiac: RRR; no murmurs, rubs, or gallops,no edema  Respiratory:  Clear to auscultation bilaterally, normal work of breathing GI: soft, nontender, nondistended, + BS MS: no deformity or atrophy brace to her left ankle.  Uses cane for ambulation. Skin:  warm and dry, no rash Neuro:  Strength and sensation are intact Psych: euthymic mood, full affect   EKG: Not completed during this office visit  Recent Labs: No results found for requested labs within last 8760 hours.    Lipid Panel    Component Value Date/Time   CHOL 261 (H) 03/06/2016 1110   TRIG 85 03/06/2016 1110   HDL 63 03/06/2016 1110   CHOLHDL 4.1 03/06/2016 1110   VLDL 17 03/06/2016 1110   LDLCALC 181 (H) 03/06/2016 1110      Wt Readings from Last 3 Encounters:  12/13/17 234 lb 4.8 oz (106.3 kg)  11/07/17 231 lb (104.8 kg)  09/10/17 232 lb 12.8 oz (105.6 kg)      Other studies Reviewed: Echo as above  ASSESSMENT AND  PLAN:  1.  Chronic dyspnea on exertion: Multifactorial in the setting of obesity, decreased stamina,  However, the need for ischemic testing still continues to be an option for her.  Especially in light of multiple cardiovascular risk factors.  The patient is reluctant to proceed with past but she would like to talk with her son about it further before proceeding.  I have explained the test to her verbally and we are providing her with a written instruction and education on this test to the use for discussion with her son.  If she chooses to have the test we will have the scheduled and have follow-up appointment to discuss results.  Otherwise the patient will be seen again in 3 to 6 months unless she is symptomatic.  2.  Hypertension: Blood pressure is elevated today at at this office visit.  The patient did have to walk a fair bit to come to the clinic area.  I rechecked her blood pressure in the exam room and found it to be 138/78.  I have advised her to continue her current medication regimen, advised her on a low-sodium diet.  She verbalizes understanding.  3.  Diabetes: Patient is being followed by her primary care physician for ongoing management.  Consider SG LP-2 inhibition for cardio protective properties.  Current medicines are reviewed at length with the patient today.    Labs/ tests ordered today include: None Phill Myron. West Pugh, ANP, AACC   12/13/2017 11:17 AM    [facility], [place], [place] [address] Phone: [phone]; Fax: [phone]

## 2017-12-14 ENCOUNTER — Ambulatory Visit: Payer: Medicare Other | Admitting: Podiatry

## 2017-12-14 ENCOUNTER — Encounter: Payer: Self-pay | Admitting: Podiatry

## 2017-12-14 DIAGNOSIS — E1140 Type 2 diabetes mellitus with diabetic neuropathy, unspecified: Secondary | ICD-10-CM

## 2017-12-14 DIAGNOSIS — M79676 Pain in unspecified toe(s): Secondary | ICD-10-CM

## 2017-12-14 DIAGNOSIS — M129 Arthropathy, unspecified: Secondary | ICD-10-CM | POA: Diagnosis not present

## 2017-12-14 DIAGNOSIS — B351 Tinea unguium: Secondary | ICD-10-CM | POA: Diagnosis not present

## 2017-12-14 NOTE — Progress Notes (Signed)
This patient presents the office stating that she presents to the office for preventative foot care services.  She says the nails are painful walking and wearing her shoes.  She also says she did well with her previous injection left foot. Weeks ago.  She says that today the pain had returned and it was painful walking and wearing her shoes.  She requests treat for her painful left ankle.    General Appearance  Alert, conversant and in no acute stress.  Vascular  Dorsalis pedis and posterior tibial  pulses are palpable  bilaterally.  Capillary return is within normal limits  bilaterally. Temperature is within normal limits  bilaterally.  Neurologic  Senn-Weinstein monofilament wire test within normal limits  bilaterally. Muscle power within normal limits bilaterally.  Nails Thick disfigured discolored nails with subungual debris  from hallux to fifth toes bilaterally. No evidence of bacterial infection or drainage bilaterally.  Orthopedic  No limitations of motion of motion feet .  No crepitus or effusions noted.   No evidence of forefoot pathology left foot.  There is a medial prominence noted at the talonavicular joint left foot as well as and he converted heel left foot.  This chronic arthritis is in  a fixed position with limited range of motion through the subtalar joint. Normal dorsiflexion and plantarflexion is noted through the ankle joint, left foot.  Sinus tarsal pain upon palpation.    Skin  normotropic skin with no porokeratosis noted bilaterally.  No signs of infections or ulcers noted.   Chronic rearfoot arthritis  STJ left foot.    Sinus tarsitis left foot.  Onychomycosis  B/l  ROV.  Debridement of onychomycosis  X 10.  Injection therapy left ankle.  Injection therapy using 1.0 cc. Of 2% xylocaine( 20 mg.) plus 1 cc. of kenalog-la ( 10 mg) plus 1/2 cc. of dexamethazone phosphate ( 2 mg).  RTC 10 weeks.   Gardiner Barefoot DPM

## 2018-01-22 ENCOUNTER — Other Ambulatory Visit: Payer: Self-pay | Admitting: Family Medicine

## 2018-01-22 DIAGNOSIS — E1140 Type 2 diabetes mellitus with diabetic neuropathy, unspecified: Secondary | ICD-10-CM

## 2018-01-23 LAB — HM DIABETES EYE EXAM

## 2018-01-28 ENCOUNTER — Ambulatory Visit: Payer: Medicare Other | Admitting: Family Medicine

## 2018-01-28 ENCOUNTER — Encounter: Payer: Self-pay | Admitting: Family Medicine

## 2018-01-28 VITALS — BP 134/82 | HR 77 | Temp 98.0°F | Wt 230.2 lb

## 2018-01-28 DIAGNOSIS — E785 Hyperlipidemia, unspecified: Secondary | ICD-10-CM | POA: Diagnosis not present

## 2018-01-28 DIAGNOSIS — Z6841 Body Mass Index (BMI) 40.0 and over, adult: Secondary | ICD-10-CM

## 2018-01-28 DIAGNOSIS — E1159 Type 2 diabetes mellitus with other circulatory complications: Secondary | ICD-10-CM | POA: Diagnosis not present

## 2018-01-28 DIAGNOSIS — I10 Essential (primary) hypertension: Secondary | ICD-10-CM | POA: Diagnosis not present

## 2018-01-28 DIAGNOSIS — E1169 Type 2 diabetes mellitus with other specified complication: Secondary | ICD-10-CM | POA: Diagnosis not present

## 2018-01-28 DIAGNOSIS — I152 Hypertension secondary to endocrine disorders: Secondary | ICD-10-CM

## 2018-01-28 DIAGNOSIS — E118 Type 2 diabetes mellitus with unspecified complications: Secondary | ICD-10-CM

## 2018-01-28 LAB — POCT GLYCOSYLATED HEMOGLOBIN (HGB A1C): Hemoglobin A1C: 6.5 % — AB (ref 4.0–5.6)

## 2018-01-28 NOTE — Addendum Note (Signed)
Addended by: Elyse Jarvis on: 01/28/2018 12:41 PM   Modules accepted: Orders

## 2018-01-28 NOTE — Patient Instructions (Signed)
Occasionally check your blood sugars 2 hours after meal,

## 2018-01-28 NOTE — Progress Notes (Signed)
  Subjective:    Patient ID: Kayla [name], female    DOB: 05/14/1941, 77 y.o.   MRN: [id number]  Kayla [name] is a 77 y.o. female who presents for follow-up of Type 2 diabetes mellitus.  Home blood sugar records: fasting range: 120 Current symptoms/problems include none and have been unchanged. Daily foot checks:   Any foot concerns: sees podiatrist Exercise: The patient does not participate in regular exercise at present. Diet:reg She continues on metformin and having no difficulty with that.  She also is taking losartan/HCTZ, Protonix and Coreg.  She has been seen by podiatry and by cardiology.  Cardiology did recommend a Lexiscan however she is concerned about the cost.  She does have some concerns with her feet but plans to follow-up with podiatry concerning this. The following portions of the patient's history were reviewed and updated as appropriate: allergies, current medications, past medical history, past social history and problem list.  ROS as in subjective above.     Objective:    Physical Exam Alert and in no distress otherwise not examined.  Blood pressure 134/82, pulse 77, temperature 98 F (36.7 C), weight 230 lb 3.2 oz (104.4 kg), SpO2 96 %.  Lab Review Diabetic Labs Latest Ref Rng & Units 01/01/2017 11/25/2016 07/11/2016 03/06/2016 09/13/2015  HbA1c - 6.5 - 6.7 6.9 6.8  Chol 125 - 200 mg/dL - - - 261(H) -  HDL >=46 mg/dL - - - 63 -  Calc LDL <130 mg/dL - - - 181(H) -  Triglycerides <150 mg/dL - - - 85 -  Creatinine 0.44 - 1.00 mg/dL - 0.80 - 0.75 -   BP/Weight 01/28/2018 12/13/2017 11/07/2017 0/27/2536 01/01/4033  Systolic BP 742 595 638 756 433  Diastolic BP 82 80 86 78 72  Wt. (Lbs) 230.2 234.3 231 232.8 232.8  BMI 39.51 40.22 39.65 39.96 39.96   Foot/eye exam completion dates Latest Ref Rng & Units 12/06/2016 12/02/2015  Eye Exam No Retinopathy No Retinopathy No Retinopathy  Foot Form Completion - - -  A1C 6.5  [name]  reports that she has never smoked. She has  never used smokeless tobacco. She reports that she does not drink alcohol or use drugs.     Assessment & Plan:    Hypertension associated with diabetes ([place])  Hyperlipidemia associated with type 2 diabetes mellitus ([place])  Class 3 severe obesity due to excess calories without serious comorbidity with body mass index (BMI) of 45.0 to 49.9 in adult [facility])  Type 2 diabetes mellitus with complication, without long-term current use of insulin ([place])   1. Rx changes: none 2. Education: Reviewed 'ABCs' of diabetes management (respective goals in parentheses):  A1C (<7), blood pressure (<130/80), and cholesterol (LDL <100). 3. Compliance at present is estimated to be fair. Efforts to improve compliance (if necessary) will be directed at increased exercise. 4. Follow up: 4 months At the end of the encounter [facility] and I strongly encouraged that.

## 2018-02-04 ENCOUNTER — Other Ambulatory Visit: Payer: Self-pay | Admitting: Adult Health

## 2018-02-04 NOTE — Telephone Encounter (Signed)
Rx sent to pharmacy   

## 2018-02-08 ENCOUNTER — Other Ambulatory Visit: Payer: Self-pay

## 2018-02-08 ENCOUNTER — Emergency Department (HOSPITAL_COMMUNITY): Payer: Medicare Other

## 2018-02-08 ENCOUNTER — Emergency Department (HOSPITAL_COMMUNITY)
Admission: EM | Admit: 2018-02-08 | Discharge: 2018-02-08 | Disposition: A | Discharge status: Home / Self Care | Payer: Medicare Other | Attending: Emergency Medicine | Admitting: Emergency Medicine

## 2018-02-08 ENCOUNTER — Encounter (HOSPITAL_COMMUNITY): Payer: Self-pay | Admitting: Emergency Medicine

## 2018-02-08 DIAGNOSIS — W228XXA Striking against or struck by other objects, initial encounter: Secondary | ICD-10-CM | POA: Diagnosis not present

## 2018-02-08 DIAGNOSIS — I10 Essential (primary) hypertension: Secondary | ICD-10-CM | POA: Insufficient documentation

## 2018-02-08 DIAGNOSIS — Y929 Unspecified place or not applicable: Secondary | ICD-10-CM | POA: Diagnosis not present

## 2018-02-08 DIAGNOSIS — Z7982 Long term (current) use of aspirin: Secondary | ICD-10-CM | POA: Insufficient documentation

## 2018-02-08 DIAGNOSIS — Y999 Unspecified external cause status: Secondary | ICD-10-CM | POA: Insufficient documentation

## 2018-02-08 DIAGNOSIS — Z79899 Other long term (current) drug therapy: Secondary | ICD-10-CM | POA: Insufficient documentation

## 2018-02-08 DIAGNOSIS — E119 Type 2 diabetes mellitus without complications: Secondary | ICD-10-CM | POA: Insufficient documentation

## 2018-02-08 DIAGNOSIS — Z7984 Long term (current) use of oral hypoglycemic drugs: Secondary | ICD-10-CM | POA: Insufficient documentation

## 2018-02-08 DIAGNOSIS — Y9301 Activity, walking, marching and hiking: Secondary | ICD-10-CM | POA: Insufficient documentation

## 2018-02-08 DIAGNOSIS — S20212A Contusion of left front wall of thorax, initial encounter: Secondary | ICD-10-CM | POA: Insufficient documentation

## 2018-02-08 DIAGNOSIS — S20302A Unspecified superficial injuries of left front wall of thorax, initial encounter: Secondary | ICD-10-CM | POA: Diagnosis present

## 2018-02-08 MED ORDER — DICLOFENAC SODIUM 1 % TD GEL
2.0000 g | Freq: Four times a day (QID) | TRANSDERMAL | Status: DC
  Administered 2018-02-08: 2 g via TOPICAL
  Filled 2018-02-08: qty 100

## 2018-02-08 NOTE — ED Triage Notes (Signed)
Pt arrives after a fall, reports fall after getting up too fast. Reports L rib pain, pain worse with inspiration.

## 2018-02-08 NOTE — Discharge Instructions (Signed)
You have a bone bruise of your ribs.  Please use the breathing device to suck air in to your lungs every commercial while watching TV.  You can apply the topical pain medicine 4 times a day.  Additionally, you can take Tylenol 500 mg every 6 hours as well as ibuprofen if that is not relieving her pain.  Please do not take ibuprofen as a long-term medicine.  Additionally, return if any symptoms are worsening or you are having continued pain.

## 2018-02-08 NOTE — ED Notes (Signed)
Patient transported to X-ray 

## 2018-02-08 NOTE — ED Provider Notes (Signed)
[facility] EMERGENCY DEPARTMENT Provider Note   CSN: [id number] Arrival date & time: 02/08/18  1903     History   Chief Complaint Chief Complaint  Patient presents with  . Fall    rib pain    HPI Kayla [name] is a 77 y.o. female.  HPI   Patient presents today with her daughter and granddaughter.  States she had fallen asleep in the chair when she stood up she feels that her feet got tangled and she hit the left side of her chest on the side table and caught herself with her arms.  No loss of consciousness, denies presyncopal feelings prior to this.  Denies any abdominal pain.  States she has never fractured her ribs before.  Tried Tylenol immediately prior to arrival.  She is concerned that she broke her ribs.  Past Medical History:  Diagnosis Date  . Arthritis   . Diabetes mellitus   . Hypertension   . Obesity   . Pulmonary embolus [facility])     Patient Active Problem List   Diagnosis Date Noted  . Dependent edema 01/01/2017  . Anemia of chronic illness 02/15/2015  . Multiple falls 02/15/2015  . Diabetic neuropathy, type II diabetes mellitus ([place]) 09/09/2012  . Hyperlipidemia associated with type 2 diabetes mellitus ([place]) 05/22/2011  . Hypertension associated with diabetes ([place]) 05/22/2011  . Obesity 12/14/2010  . Diabetes mellitus type 2 with complications ([place]) 24/03/7352  . History of pulmonary embolus (PE) 12/14/2010    Past Surgical History:  Procedure Laterality Date  . ABDOMINAL HYSTERECTOMY    . CHOLECYSTECTOMY    . JOINT REPLACEMENT  Right     OB History   None      Home Medications    Prior to Admission medications   Medication Sig Start Date End Date Taking? Authorizing Provider  aspirin EC 325 MG tablet USE PER THE INSTRUCTIONS ON PACKAGE LABEL 01/01/17   Denita Lung, MD  carvedilol (COREG) 3.125 MG tablet TAKE 1 TABLET BY MOUTH TWICE A DAY 03/28/17   Denita Lung, MD  glucose blood (ONE TOUCH ULTRA TEST) test strip  Test once a day (THIS IS FOR ONE TOUCH ULTRA ) 05/02/17   Denita Lung, MD  losartan-hydrochlorothiazide (HYZAAR) 50-12.5 MG tablet TAKE 1 TABLET BY MOUTH DAILY. 11/29/17   Denita Lung, MD  metFORMIN (GLUCOPHAGE) 500 MG tablet TAKE 1 TABLET BY MOUTH TWO TIMES DAILY WITH MEALS 01/22/18   Denita Lung, MD  Multiple Vitamins-Minerals (MULTIVITAMIN WITH MINERALS) tablet Take 1 tablet by mouth daily.    [provider]  pantoprazole (PROTONIX) 20 MG tablet TAKE 1 TABLET BY MOUTH EVERY DAY 02/04/18   Leonie Man, MD    Family History Family History  Problem Relation Age of Onset  . Hypertension Mother     Social History Social History   Tobacco Use  . Smoking status: Never Smoker  . Smokeless tobacco: Never Used  Substance Use Topics  . Alcohol use: No  . Drug use: No     Allergies   Patient has no known allergies.   Review of Systems Review of Systems  Constitutional: Negative for fever.  Eyes: Negative for visual disturbance.  Respiratory: Negative for chest tightness and shortness of breath.   Cardiovascular: Negative for chest pain.  Gastrointestinal: Negative for abdominal distention.  Musculoskeletal: Positive for arthralgias.  Skin: Negative for rash and wound.  All other systems reviewed and are negative.    Physical  Exam Updated Vital Signs BP 140/65   Pulse 77   Temp 98.5 F (36.9 C) (Oral)   Resp 18   Wt 104.3 kg (230 lb)   SpO2 99%   BMI 39.48 kg/m   Physical Exam  Constitutional: She is oriented to person, place, and time. She appears well-developed and well-nourished. No distress.  HENT:  Head: Normocephalic and atraumatic.  Mouth/Throat: Oropharynx is clear and moist.  Eyes: Conjunctivae and EOM are normal.  Neck: Normal range of motion.  Cardiovascular: Normal rate, regular rhythm and normal heart sounds.  No murmur heard. Pulmonary/Chest: Effort normal and breath sounds normal. She exhibits tenderness.    Tender to palpation  over left ribs in mid axillary line.  No rash overlying this or wound.  Abdominal: Soft. Bowel sounds are normal.  Neurological: She is alert and oriented to person, place, and time.  Skin: Skin is warm and dry. Capillary refill takes less than 2 seconds.  Psychiatric: She has a normal mood and affect.     ED Treatments / Results  Labs (all labs ordered are listed, but only abnormal results are displayed) Labs Reviewed - No data to display  EKG None  Radiology Dg Ribs Unilateral W/chest Left  Result Date: 02/08/2018 CLINICAL DATA:  Fall today.  Left lower chest wall pain. EXAM: LEFT RIBS AND CHEST - 3+ VIEW COMPARISON:  Chest CT, 11/25/2016 FINDINGS: No rib fracture or rib lesion. Heart is normal in size.  No mediastinal or hilar masses. Clear lungs.  No pleural effusion or pneumothorax. IMPRESSION: Negative. Electronically Signed   By: Lajean Manes M.D.   On: 02/08/2018 20:33    Procedures Procedures (including critical care time)  Medications Ordered in ED Medications  diclofenac sodium (VOLTAREN) 1 % transdermal gel 2 g (has no administration in time range)     Initial Impression / Assessment and Plan / ED Course  I have reviewed the triage vital signs and the nursing notes.  Pertinent labs & imaging results that were available during my care of the patient were reviewed by me and considered in my medical decision making (see chart for details).    Patient presents with left-sided rib pain, no abdominal pain.  Dedicated rib x-rays without fracture.  Will treat as rib contusion.  Will apply Voltaren gel prior to discharge here, discharge patient with additional part of this tube.  Additionally use Tylenol scheduled and ibuprofen for breakthrough at home.  Will send home with incentive spirometry.  Instructed patient to return if pain is not relieved over the next few days or if persistent or new shortness of breath or abdominal pain.  Final Clinical Impressions(s) / ED  Diagnoses   Final diagnoses:  Rib contusion, left, initial encounter    ED Discharge Orders    None       Sela Hilding, MD 02/08/18 2144    Elnora Morrison, MD 02/08/18 2350

## 2018-02-08 NOTE — ED Provider Notes (Signed)
MSE was initiated and I personally evaluated the patient and placed orders (if any) at  7:20 PM on February 08, 2018.  The patient appears stable so that the remainder of the MSE may be completed by another provider.  Patient placed in Quick Look pathway, seen and evaluated   Chief Complaint: left rib pain  HPI:   Patient presents to ED for evaluation of mechanical fall that occurred prior to arrival.  She fell asleep on a chair.  She then woke up, believes she got her foot stuck on something on the floor and fell.  She tried to brace herself for the fall by outstretching her arms but was unsuccessful.  Reports landing on her left side.  Denies any head injury or loss of consciousness.  States left-sided rib pain worse with palpation and movement.  Denies any prior rib fractures or injuries.  Denies any chest pain, shortness of breath, hemoptysis, cough, numbness in legs, neck pain or headache.  ROS: Left rib pain  Physical Exam:   Gen: No distress  Neuro: Awake and Alert  Skin: Warm    Focused Exam: Tenderness to palpation of the left lower ribs.  No abdominal tenderness palpation. Lungs CTAB. RRR. No C, T, L spine TTP.  Initiation of care has begun. The patient has been counseled on the process, plan, and necessity for staying for the completion/evaluation, and the remainder of the medical screening examination    Delia Heady, PA-C 02/08/18 Orvilla Cornwall, MD 02/08/18 2252

## 2018-02-08 NOTE — ED Notes (Signed)
ED Provider at bedside. 

## 2018-02-08 NOTE — ED Notes (Signed)
RN requested medication from pharmacy  

## 2018-02-13 NOTE — Progress Notes (Signed)
Chief Complaint  Patient presents with  . Follow-up    from ER visit. Left side under breast around to ribs. Still sore but getting better.     Patient presents for ER follow-up. She went to ER with concern for rib injury on 7/12. She was diagnosed with left rib contusion. She had fallen asleep in the chair, stood up and ER reported she felt like her feet got tangled (she states she wasn't really sure what happened, hadn't eaten breakfast yet, wasn't sure if it could have been due to low sugar), hit the left side of her chest on the side table and caught herself with her arms.   X-ray: No rib fracture or rib lesion.  She has noticed bruising--on the side it is getting lighter, the one under her breast is also getting smaller.  She was prescribed voltaren gel, which has been helping It is still a little sore, but improving. Has incentive spirometer, which she has been using 4x/day. Doesn't feel like she is splinting or not able to take deep breaths.  PMH, PSH, SH reviewed  Outpatient Encounter Medications as of 02/14/2018  Medication Sig  . aspirin EC 325 MG tablet USE PER THE INSTRUCTIONS ON PACKAGE LABEL  . carvedilol (COREG) 3.125 MG tablet TAKE 1 TABLET BY MOUTH TWICE A DAY  . diclofenac sodium (VOLTAREN) 1 % GEL Apply 2 g topically 4 (four) times daily.  [name] glucose blood (ONE TOUCH ULTRA TEST) test strip Test once a day (THIS IS FOR ONE TOUCH ULTRA )  . losartan-hydrochlorothiazide (HYZAAR) 50-12.5 MG tablet TAKE 1 TABLET BY MOUTH DAILY.  . metFORMIN (GLUCOPHAGE) 500 MG tablet TAKE 1 TABLET BY MOUTH TWO TIMES DAILY WITH MEALS  . Multiple Vitamins-Minerals (MULTIVITAMIN WITH MINERALS) tablet Take 1 tablet by mouth daily.  . pantoprazole (PROTONIX) 20 MG tablet TAKE 1 TABLET BY MOUTH EVERY DAY (Patient not taking: Reported on 02/14/2018)   No facility-administered encounter medications on file as of 02/14/2018.    No Known Allergies  ROS: no fever, chills, cough, shortness of breath,  nausea, vomiting, bleeding.  Bruising is improving, as is chest wall pain.    PHYSICAL EXAM:  BP 130/74   Pulse 80   Ht 5\' 4"  (1.626 m)   Wt 232 lb 6.4 oz (105.4 kg)   SpO2 99%   BMI 39.89 kg/m   Well appearing, pleasant, elderly female in no distress.  Had some trouble walking due to knee pain (used wheelchair for assistance).  Heart: regular rate and rhythm Lungs: clear bilatearlly Chest wall: notable for prominent left sternoclavicular joint, nontender Bruising left lateral chest wall--some clearing centrally, slight yellow anteriorly.  Only tender along the posterior aspect of the bruising (lateral chest wall), small focal area.  No bony abnormality palpable. Under breasts--slightly tender on the left. Discoloration noted bilaterally under her breasts, hyperpigmented, slightly raised   ASSESSMENT/PLAN:  Contusion of rib on left side, subsequent encounter - healing/resolving   Continue supportive measures. Discussed when can stop using incentive spirometer. Discoloration under breasts not related to bruising/injury (just the slight soreness on the right).  Discussed keeping area dry

## 2018-02-14 ENCOUNTER — Ambulatory Visit: Payer: Medicare Other | Admitting: Family Medicine

## 2018-02-14 ENCOUNTER — Other Ambulatory Visit: Payer: Self-pay | Admitting: Family Medicine

## 2018-02-14 ENCOUNTER — Encounter: Payer: Self-pay | Admitting: Family Medicine

## 2018-02-14 VITALS — BP 130/74 | HR 80 | Ht 64.0 in | Wt 232.4 lb

## 2018-02-14 DIAGNOSIS — S20212D Contusion of left front wall of thorax, subsequent encounter: Secondary | ICD-10-CM

## 2018-02-14 NOTE — Patient Instructions (Addendum)
It looks like you are improving nicely from your rib injury. The bruising is improving. The pain and discoloration should continue to gradually improve.  You may cut back the use of the "breathing machine" when you feel like you are taking normal deep breaths, and not splinting and breathing shallowly due to pain (as we discussed).  You likely do not need to use the pain gel under the breast, but just on the side of the rib, if it is hurting.  It is also fine to continue to use tylenol for pain, if needed.  The discoloration under the breast is likely related to some irritation and moisture (the soreness is from the injury, but not the discoloration)--you have similar discoloration under the right breast as well.  Try and keep the area under the breast dry--in this heat, sometimes that means using some powder in this area before putting your bra on.  Return if you develop worsening pain, bruising, fever, shortness of breath, or any other concerns.

## 2018-02-15 ENCOUNTER — Encounter: Payer: Self-pay | Admitting: Podiatry

## 2018-02-15 ENCOUNTER — Ambulatory Visit: Payer: Medicare Other | Admitting: Podiatry

## 2018-02-15 DIAGNOSIS — L600 Ingrowing nail: Secondary | ICD-10-CM | POA: Diagnosis not present

## 2018-02-17 NOTE — Progress Notes (Signed)
  Subjective:  Patient ID: Kayla [name], female    DOB: [birth year],  MRN: [id number]  Chief Complaint  Patient presents with  . Toe Pain    Hallux right - patient states toe is tender around toenail x 1 month - nail trim appt next on Aug 23rd   77 y.o. female returns for the above complaint.  States that the toe is tender.  Thinks that she may have an ingrown nail.  Objective:  There were no vitals filed for this visit. General AA&O x3. Normal mood and affect.  Vascular Pedal pulses palpable.  Neurologic Epicritic sensation grossly intact.  Dermatologic No open lesions. Skin normal texture and turgor.  Orthopedic: Painful ingrown nail right hallux nail medial and lateral borders   Assessment & Plan:  Patient was evaluated and treated and all questions answered.  Ingrown nail -Gentle slant back debridement of right hallux nail.  Patient verbalized relief post debridement. -Follow-up as scheduled for routine foot care.  No follow-ups on file.

## 2018-03-01 ENCOUNTER — Other Ambulatory Visit: Payer: Self-pay | Admitting: Family Medicine

## 2018-03-01 MED ORDER — ASPIRIN 325 MG PO TBEC: DELAYED_RELEASE_TABLET | ORAL | 1 refills | Status: DC

## 2018-03-01 NOTE — Addendum Note (Signed)
Addended by: Minette Headland A on: 03/01/2018 12:07 PM   Modules accepted: Orders

## 2018-03-22 ENCOUNTER — Ambulatory Visit: Payer: Medicare Other | Admitting: Podiatry

## 2018-03-22 ENCOUNTER — Encounter: Payer: Self-pay | Admitting: Podiatry

## 2018-03-22 DIAGNOSIS — E1140 Type 2 diabetes mellitus with diabetic neuropathy, unspecified: Secondary | ICD-10-CM

## 2018-03-22 DIAGNOSIS — M79676 Pain in unspecified toe(s): Secondary | ICD-10-CM

## 2018-03-22 DIAGNOSIS — B351 Tinea unguium: Secondary | ICD-10-CM

## 2018-03-22 NOTE — Progress Notes (Signed)
Patient ID: [name], female   DOB: [birth year], 77 y.o.   MRN: [id number] Complaint:  Visit Type: Patient returns to my office for continued preventative foot care services. Complaint: Patient states" my nails have grown long and thick and become painful to walk and wear shoes" Patient has been diagnosed with DM with neuropathy.[name] He presents for preventative foot care services. No changes to ROS,  Heel calluses noted.  Podiatric Exam: Vascular: dorsalis pedis and posterior tibial pulses are palpable bilateral. Capillary return is immediate. Temperature gradient is WNL. Skin turgor WNL  Sensorium: Normal Semmes Weinstein monofilament test. Normal tactile sensation bilaterally. Nail Exam: Pt has thick disfigured discolored nails with subungual debris noted bilateral entire nail hallux through fifth toenails Ulcer Exam: There is no evidence of ulcer or pre-ulcerative changes or infection. Orthopedic Exam: Muscle tone and strength are WNL. No limitations in general ROM. No crepitus or effusions noted. Foot type and digits show no abnormalities. Bony prominences are unremarkable. Skin: No Porokeratosis. No infection or ulcers.    Diagnosis:  Tinea unguium, Pain in right toe, pain in left toes,  Debridement of heel callus.  Treatment & Plan Procedures and Treatment: Consent by patient was obtained for treatment procedures. The patient understood the discussion of treatment and procedures well. All questions were answered thoroughly reviewed. Debridement of mycotic and hypertrophic toenails, 1 through 5 bilateral and clearing of subungual debris. No ulceration, no infection noted.   Return Visit-Office Procedure: Patient instructed to return to the office for a follow up visit 3 months for continued evaluation and treatment.   Gardiner Barefoot DPM

## 2018-04-09 ENCOUNTER — Ambulatory Visit: Payer: Medicare Other | Admitting: Family Medicine

## 2018-04-11 ENCOUNTER — Ambulatory Visit: Payer: Medicare Other | Admitting: Family Medicine

## 2018-04-16 ENCOUNTER — Encounter: Payer: Self-pay | Admitting: Family Medicine

## 2018-04-16 ENCOUNTER — Ambulatory Visit: Payer: Medicare Other | Admitting: Family Medicine

## 2018-04-16 VITALS — BP 144/88 | HR 78 | Temp 97.6°F | Wt 237.8 lb

## 2018-04-16 DIAGNOSIS — I152 Hypertension secondary to endocrine disorders: Secondary | ICD-10-CM

## 2018-04-16 DIAGNOSIS — E1159 Type 2 diabetes mellitus with other circulatory complications: Secondary | ICD-10-CM

## 2018-04-16 DIAGNOSIS — Z6841 Body Mass Index (BMI) 40.0 and over, adult: Secondary | ICD-10-CM

## 2018-04-16 DIAGNOSIS — E66813 Obesity, class 3: Secondary | ICD-10-CM

## 2018-04-16 DIAGNOSIS — E785 Hyperlipidemia, unspecified: Secondary | ICD-10-CM

## 2018-04-16 DIAGNOSIS — E118 Type 2 diabetes mellitus with unspecified complications: Secondary | ICD-10-CM

## 2018-04-16 DIAGNOSIS — Z86711 Personal history of pulmonary embolism: Secondary | ICD-10-CM

## 2018-04-16 DIAGNOSIS — E1140 Type 2 diabetes mellitus with diabetic neuropathy, unspecified: Secondary | ICD-10-CM

## 2018-04-16 DIAGNOSIS — E1169 Type 2 diabetes mellitus with other specified complication: Secondary | ICD-10-CM | POA: Diagnosis not present

## 2018-04-16 DIAGNOSIS — R609 Edema, unspecified: Secondary | ICD-10-CM

## 2018-04-16 DIAGNOSIS — Z23 Encounter for immunization: Secondary | ICD-10-CM | POA: Diagnosis not present

## 2018-04-16 DIAGNOSIS — K219 Gastro-esophageal reflux disease without esophagitis: Secondary | ICD-10-CM

## 2018-04-16 DIAGNOSIS — I10 Essential (primary) hypertension: Secondary | ICD-10-CM

## 2018-04-16 LAB — POCT GLYCOSYLATED HEMOGLOBIN (HGB A1C): Hemoglobin A1C: 6.7 % — AB (ref 4.0–5.6)

## 2018-04-16 NOTE — Progress Notes (Signed)
  Subjective:    Patient ID: Kayla [name], female    DOB: 1941/03/12, 77 y.o.   MRN: [id number]  Kayla [name] is a 77 y.o. female who presents for follow-up of Type 2 diabetes mellitus.  She is also scheduled to see podiatry as she continues have difficulty with left foot pain.  Apparently there is a question of whether this is a fracture or a different kind of foot problem.  She has not had any recent falls.  She continues on metformin without difficulty.  She is also taking losartan/HCTZ as well as Coreg.  Her physical activity is limited because of the foot.  Smoking and drinking were reviewed.  He continues on Protonix and is having no difficulty with that.  Patient is checking home blood sugars.   Home blood sugar records: meter record How often is blood sugars being checked: qd Current symptoms/problems include none and have been unchanged. Daily foot checks: yes   Any foot concerns: pain Last eye exam: 02-2018 Exercise: walking  The following portions of the patient's history were reviewed and updated as appropriate: allergies, current medications, past medical history, past social history and problem list.  ROS as in subjective above.     Objective:    Physical Exam Alert and in no distress otherwise not examined.  Blood pressure (!) 144/88, pulse 78, temperature 97.6 F (36.4 C), weight 237 lb 12.8 oz (107.9 kg), SpO2 99 %.  Lab Review Diabetic Labs Latest Ref Rng & Units 01/28/2018 01/01/2017 11/25/2016 07/11/2016 03/06/2016  HbA1c 4.0 - 5.6 % 6.5(A) 6.5 - 6.7 6.9  Chol 125 - 200 mg/dL - - - - 261(H)  HDL >=46 mg/dL - - - - 63  Calc LDL <130 mg/dL - - - - 181(H)  Triglycerides <150 mg/dL - - - - 85  Creatinine 0.44 - 1.00 mg/dL - - 0.80 - 0.75   BP/Weight 04/16/2018 02/14/2018 02/08/2018 01/28/2018 3/81/8299  Systolic BP 371 696 789 381 017  Diastolic BP 88 74 65 82 80  Wt. (Lbs) 237.8 232.4 230 230.2 234.3  BMI 40.82 39.89 39.48 39.51 40.22   Foot/eye exam completion dates  Latest Ref Rng & Units 01/23/2018 12/06/2016  Eye Exam No Retinopathy No Retinopathy No Retinopathy  Foot Form Completion - - -   A1c is 6.7 Kayla [name]  reports that she has never smoked. She has never used smokeless tobacco. She reports that she does not drink alcohol or use drugs.     Assessment & Plan:    Need for influenza vaccination - Plan: Flu vaccine HIGH DOSE PF (Fluzone High dose)  Hypertension associated with diabetes ([place])  Hyperlipidemia associated with type 2 diabetes mellitus ([place])  Class 3 severe obesity due to excess calories without serious comorbidity with body mass index (BMI) of 45.0 to 49.9 in adult [facility])  Type 2 diabetes mellitus with complication, without long-term current use of insulin ([place])  Type 2 diabetes mellitus with diabetic neuropathy, unspecified whether long term insulin use ([facility])  Dependent edema  History of pulmonary embolus (PE)  Gastroesophageal reflux disease without esophagitis  1. Rx changes: none 2. Education: Reviewed 'ABCs' of diabetes management (respective goals in parentheses):  A1C (<7), blood pressure (<130/80), and cholesterol (LDL <100). 3. Compliance at present is estimated to be fair. Efforts to improve compliance (if necessary) will be directed at increased exercise.  As tolerated due to foot pain 4. Follow up: 4 months

## 2018-04-16 NOTE — Addendum Note (Signed)
Addended by: Elyse Jarvis on: 04/16/2018 02:55 PM   Modules accepted: Orders

## 2018-04-19 ENCOUNTER — Ambulatory Visit: Payer: Medicare Other | Admitting: Podiatry

## 2018-04-19 ENCOUNTER — Ambulatory Visit (INDEPENDENT_AMBULATORY_CARE_PROVIDER_SITE_OTHER): Payer: Medicare Other

## 2018-04-19 ENCOUNTER — Other Ambulatory Visit: Payer: Self-pay | Admitting: Family Medicine

## 2018-04-19 DIAGNOSIS — M21072 Valgus deformity, not elsewhere classified, left ankle: Secondary | ICD-10-CM | POA: Diagnosis not present

## 2018-04-19 DIAGNOSIS — I10 Essential (primary) hypertension: Principal | ICD-10-CM

## 2018-04-19 DIAGNOSIS — E1159 Type 2 diabetes mellitus with other circulatory complications: Secondary | ICD-10-CM

## 2018-04-19 DIAGNOSIS — M778 Other enthesopathies, not elsewhere classified: Secondary | ICD-10-CM

## 2018-04-19 DIAGNOSIS — R600 Localized edema: Secondary | ICD-10-CM

## 2018-04-19 DIAGNOSIS — M779 Enthesopathy, unspecified: Secondary | ICD-10-CM

## 2018-04-19 DIAGNOSIS — M21062 Valgus deformity, not elsewhere classified, left knee: Secondary | ICD-10-CM | POA: Diagnosis not present

## 2018-04-19 DIAGNOSIS — I152 Hypertension secondary to endocrine disorders: Secondary | ICD-10-CM

## 2018-04-19 NOTE — Progress Notes (Signed)
Subjective:  Patient ID: Kayla [name], female    DOB: 1941-04-20,  MRN: [id number]  Chief Complaint  Patient presents with  . Foot Pain    severe left foot pain - patient also complains of left knee pain which she has seen an orthopedic surgeon for in the past.    77 y.o. female presents with the above complaint.  Reports severe pain to the left ankle.  Seen orthopedic surgeon for this in the past and offered injection but she does not interested in receiving injections.  Also with pain left knee but forgot that we do not treat knees.  Review of Systems: Negative except as noted in the HPI. Denies N/V/F/Ch.  Past Medical History:  Diagnosis Date  . Arthritis   . Diabetes mellitus   . Hypertension   . Obesity   . Pulmonary embolus ([facility])     Current Outpatient Medications:  .  aspirin ([employer] ASPIRIN EC) 325 MG EC tablet, USE PER THE INSTRUCTIONS ON PACKAGE LABEL, Disp: 300 tablet, Rfl: 1 .  aspirin EC 325 MG tablet, USE PER THE INSTRUCTIONS ON PACKAGE LABEL, Disp: 100 tablet, Rfl: 3 .  carvedilol (COREG) 3.125 MG tablet, TAKE 1 TABLET BY MOUTH TWICE A DAY, Disp: 180 tablet, Rfl: 3 .  diclofenac sodium (VOLTAREN) 1 % GEL, Apply 2 g topically 4 (four) times daily., Disp: , Rfl:  .  glucose blood (ONE TOUCH ULTRA TEST) test strip, Test once a day (THIS IS FOR ONE TOUCH ULTRA ), Disp: 100 each, Rfl: 5 .  losartan-hydrochlorothiazide (HYZAAR) 50-12.5 MG tablet, TAKE 1 TABLET BY MOUTH DAILY., Disp: 90 tablet, Rfl: 2 .  metFORMIN (GLUCOPHAGE) 500 MG tablet, TAKE 1 TABLET BY MOUTH TWO TIMES DAILY WITH MEALS, Disp: 180 tablet, Rfl: 0 .  Multiple Vitamins-Minerals (MULTIVITAMIN WITH MINERALS) tablet, Take 1 tablet by mouth daily., Disp: , Rfl:  .  pantoprazole (PROTONIX) 20 MG tablet, TAKE 1 TABLET BY MOUTH EVERY DAY (Patient not taking: Reported on 02/14/2018), Disp: 90 tablet, Rfl: 0  Social History   Tobacco Use  Smoking Status Never Smoker  Smokeless Tobacco Never Used    No Known  Allergies Objective:  There were no vitals filed for this visit. There is no height or weight on file to calculate BMI. Constitutional Well developed. Well nourished.  Vascular Dorsalis pedis pulses palpable bilaterally. Posterior tibial pulses palpable bilaterally. Capillary refill normal to all digits.  No cyanosis or clubbing noted. Pedal hair growth normal.  Neurologic Normal speech. Oriented to person, place, and time. Epicritic sensation to light touch grossly present bilaterally.  Dermatologic Nails well groomed and normal in appearance. No open wounds. No skin lesions.  Orthopedic: Normal joint ROM without pain or crepitus bilaterally. Severe pes planus with left ankle valgus Left knee valgus deformity noted Pain palpation about the medial lateral aspect of the left ankle and at the ATFL left   Radiographs: X-rays reviewed.  Ankle valgus with degenerative changes to the ankle.  Severe pes planus. Assessment:   1. Capsulitis of foot, left   2. Localized edema   3. Acquired valgus deformity of left ankle   4. Acquired valgus deformity knee, left    Plan:  Patient was evaluated and treated and all questions answered.  Pes planus with ankle valgus and degenerative changes -X-rays reviewed with patient -Discussed with patient that she would benefit from custom AFO due to the flatfoot that is now causing ankle issues.  I think she would also benefit follow-up with orthopedic  surgeon for knee issues however even if her knee is corrected she still has severe valgus deformity to the foot.  Localized edema left ankle -Soft cast consisting of Unna boot and compression dressing applied.  Patient to have removed on Wednesday we will have her casted for the brace at that time.  Return in about 5 days (around 04/24/2018) for Ankle arthritis left f/u, unna boot removal.

## 2018-04-23 ENCOUNTER — Ambulatory Visit (INDEPENDENT_AMBULATORY_CARE_PROVIDER_SITE_OTHER): Payer: Medicare Other

## 2018-04-23 DIAGNOSIS — R600 Localized edema: Secondary | ICD-10-CM | POA: Diagnosis not present

## 2018-04-25 ENCOUNTER — Ambulatory Visit (INDEPENDENT_AMBULATORY_CARE_PROVIDER_SITE_OTHER): Payer: Medicare Other | Admitting: Podiatry

## 2018-04-25 DIAGNOSIS — M21072 Valgus deformity, not elsewhere classified, left ankle: Secondary | ICD-10-CM | POA: Diagnosis not present

## 2018-04-25 DIAGNOSIS — R600 Localized edema: Secondary | ICD-10-CM

## 2018-04-25 DIAGNOSIS — M779 Enthesopathy, unspecified: Principal | ICD-10-CM

## 2018-04-25 DIAGNOSIS — M21062 Valgus deformity, not elsewhere classified, left knee: Secondary | ICD-10-CM | POA: Diagnosis not present

## 2018-04-25 DIAGNOSIS — M778 Other enthesopathies, not elsewhere classified: Secondary | ICD-10-CM

## 2018-04-25 NOTE — Progress Notes (Signed)
  Subjective:  Patient ID: Kayla [name], female    DOB: 1940-10-02,  MRN: [id number]  No chief complaint on file.   77 y.o. female presents with the above complaint.  Was seen in the office for an Unna boot change but states that the [employer] was getting uncomfortable so she came back in.  Scheduled for casting for an AFO in October still having pain in the ankle area.  States that she has received an AFO in the past and it was modified several times thinks it was a 2 to 3 years ago.  Review of Systems: Negative except as noted in the HPI. Denies N/V/F/Ch.  Past Medical History:  Diagnosis Date  . Arthritis   . Diabetes mellitus   . Hypertension   . Obesity   . Pulmonary embolus (HCC)     Current Outpatient Medications:  .  aspirin ([employer] ASPIRIN EC) 325 MG EC tablet, USE PER THE INSTRUCTIONS ON PACKAGE LABEL, Disp: 300 tablet, Rfl: 1 .  aspirin EC 325 MG tablet, USE PER THE INSTRUCTIONS ON PACKAGE LABEL, Disp: 100 tablet, Rfl: 3 .  carvedilol (COREG) 3.125 MG tablet, TAKE 1 TABLET BY MOUTH TWICE A DAY, Disp: 180 tablet, Rfl: 3 .  diclofenac sodium (VOLTAREN) 1 % GEL, Apply 2 g topically 4 (four) times daily., Disp: , Rfl:  .  glucose blood (ONE TOUCH ULTRA TEST) test strip, Test once a day (THIS IS FOR ONE TOUCH ULTRA ), Disp: 100 each, Rfl: 5 .  losartan-hydrochlorothiazide (HYZAAR) 50-12.5 MG tablet, TAKE 1 TABLET BY MOUTH DAILY., Disp: 90 tablet, Rfl: 2 .  metFORMIN (GLUCOPHAGE) 500 MG tablet, TAKE 1 TABLET BY MOUTH TWO TIMES DAILY WITH MEALS, Disp: 180 tablet, Rfl: 0 .  Multiple Vitamins-Minerals (MULTIVITAMIN WITH MINERALS) tablet, Take 1 tablet by mouth daily., Disp: , Rfl:  .  pantoprazole (PROTONIX) 20 MG tablet, TAKE 1 TABLET BY MOUTH EVERY DAY (Patient not taking: Reported on 02/14/2018), Disp: 90 tablet, Rfl: 0  Social History   Tobacco Use  Smoking Status Never Smoker  Smokeless Tobacco Never Used    No Known Allergies Objective:  There were no vitals filed for this  visit. There is no height or weight on file to calculate BMI. Constitutional Well developed. Well nourished.  Vascular Dorsalis pedis pulses palpable bilaterally. Posterior tibial pulses palpable bilaterally. Capillary refill normal to all digits.  No cyanosis or clubbing noted. Pedal hair growth normal.  Neurologic Normal speech. Oriented to person, place, and time. Epicritic sensation to light touch grossly present bilaterally.  Dermatologic Nails well groomed and normal in appearance. No open wounds. No skin lesions.  Orthopedic: Normal joint ROM without pain or crepitus bilaterally. Severe pes planus with left ankle valgus Left knee valgus deformity noted Pain palpation about the medial lateral aspect of the left ankle and at the ATFL left   Radiographs:  9/20: Ankle valgus with degenerative changes to the ankle.  Severe pes planus. Assessment:   1. Capsulitis of foot, left   2. Localized edema   3. Acquired valgus deformity of left ankle   4. Acquired valgus deformity knee, left    Plan:  Patient was evaluated and treated and all questions answered.  Pes planus with ankle valgus and degenerative changes -Discussed again that she benefit from AFO.  Scheduled for casting soon.  Advised to bring her prior AFO that she had.  Localized edema left ankle -Soft cast consisting of unna boot and Coban applied today. No follow-ups on file.

## 2018-04-26 ENCOUNTER — Other Ambulatory Visit: Payer: Medicare Other

## 2018-04-26 NOTE — Progress Notes (Signed)
Patient is here today for Unna boot change to her left foot.  She states that her foot is still hurting on the top and that the wrap was too tight.  Removed in a boot noted reduction in swelling, no pain on palpation.  Reapplied [employer] patient stated comfort with application.  She is to follow-up in 2 to 3 days to remove the Unna boot and be reevaluated.

## 2018-05-03 ENCOUNTER — Ambulatory Visit: Payer: Medicare Other | Admitting: Podiatry

## 2018-05-03 DIAGNOSIS — M21062 Valgus deformity, not elsewhere classified, left knee: Secondary | ICD-10-CM

## 2018-05-03 DIAGNOSIS — M779 Enthesopathy, unspecified: Secondary | ICD-10-CM | POA: Diagnosis not present

## 2018-05-03 DIAGNOSIS — M21072 Valgus deformity, not elsewhere classified, left ankle: Secondary | ICD-10-CM

## 2018-05-03 DIAGNOSIS — M778 Other enthesopathies, not elsewhere classified: Secondary | ICD-10-CM

## 2018-05-03 DIAGNOSIS — R600 Localized edema: Secondary | ICD-10-CM

## 2018-05-03 MED ORDER — MELOXICAM 7.5 MG PO TABS: 7.5000 mg | ORAL_TABLET | Freq: Every day | ORAL | 0 refills | Status: DC

## 2018-05-03 NOTE — Progress Notes (Signed)
Subjective:  Patient ID: Kayla [name], female    DOB: [birth year],  MRN: [id number]  Chief Complaint  Patient presents with  . Flat Foot    )Follow Up - diabetic with  severe left foot pain on top of foot towards ankle/ wakes pt up at nigh    77 y.o. female presents with the above complaint.  Patient still having severe foot pain.  Same pain as she was having before.  Now saying is waking her up at night.  Review of Systems: Negative except as noted in the HPI. Denies N/V/F/Ch.  Past Medical History:  Diagnosis Date  . Arthritis   . Diabetes mellitus   . Hypertension   . Obesity   . Pulmonary embolus (HCC)     Current Outpatient Medications:  .  aspirin ([employer] ASPIRIN EC) 325 MG EC tablet, USE PER THE INSTRUCTIONS ON PACKAGE LABEL, Disp: 300 tablet, Rfl: 1 .  aspirin EC 325 MG tablet, USE PER THE INSTRUCTIONS ON PACKAGE LABEL, Disp: 100 tablet, Rfl: 3 .  carvedilol (COREG) 3.125 MG tablet, TAKE 1 TABLET BY MOUTH TWICE A DAY, Disp: 180 tablet, Rfl: 3 .  diclofenac sodium (VOLTAREN) 1 % GEL, Apply 2 g topically 4 (four) times daily., Disp: , Rfl:  .  glucose blood (ONE TOUCH ULTRA TEST) test strip, Test once a day (THIS IS FOR ONE TOUCH ULTRA ), Disp: 100 each, Rfl: 5 .  losartan-hydrochlorothiazide (HYZAAR) 50-12.5 MG tablet, TAKE 1 TABLET BY MOUTH DAILY., Disp: 90 tablet, Rfl: 2 .  meloxicam (MOBIC) 7.5 MG tablet, Take 1 tablet (7.5 mg total) by mouth daily., Disp: 30 tablet, Rfl: 0 .  metFORMIN (GLUCOPHAGE) 500 MG tablet, TAKE 1 TABLET BY MOUTH TWO TIMES DAILY WITH MEALS, Disp: 180 tablet, Rfl: 0 .  Multiple Vitamins-Minerals (MULTIVITAMIN WITH MINERALS) tablet, Take 1 tablet by mouth daily., Disp: , Rfl:  .  pantoprazole (PROTONIX) 20 MG tablet, TAKE 1 TABLET BY MOUTH EVERY DAY (Patient not taking: Reported on 02/14/2018), Disp: 90 tablet, Rfl: 0  Social History   Tobacco Use  Smoking Status Never Smoker  Smokeless Tobacco Never Used    No Known Allergies Objective:  There  were no vitals filed for this visit. There is no height or weight on file to calculate BMI. Constitutional Well developed. Well nourished.  Vascular Dorsalis pedis pulses palpable bilaterally. Posterior tibial pulses palpable bilaterally. Capillary refill normal to all digits.  No cyanosis or clubbing noted. Pedal hair growth normal.  Neurologic Normal speech. Oriented to person, place, and time. Epicritic sensation to light touch grossly present bilaterally.  Dermatologic Nails well groomed and normal in appearance. No open wounds. No skin lesions.  Orthopedic: Normal joint ROM without pain or crepitus bilaterally. Severe pes planus with left ankle valgus Left knee valgus deformity noted Pain palpation about the medial lateral aspect of the left ankle and at the ATFL left   Radiographs:  9/20: Ankle valgus with degenerative changes to the ankle.  Severe pes planus. Assessment:   1. Capsulitis of foot, left   2. Localized edema   3. Acquired valgus deformity of left ankle   4. Acquired valgus deformity knee, left    Plan:  Patient was evaluated and treated and all questions answered.  Pes planus with ankle valgus and degenerative changes -Once again discussed with patient that there is nothing I can do for her shoulder provide a brace for her ankle.  I do believe that she has severe knee deformity that is likely  more contributing to her pain in her ankle.  She would benefit from AFO and I have made sure today that she has an appointment for fabrication of the brace.  I did order meloxicam today as requested.  I cannot do anything more for her until she gets the brace dispensed.  I offered additional Unna boot for compression but patient does not wish to have at this time.  Tubigrip applied today for swelling.  No follow-ups on file.

## 2018-05-07 ENCOUNTER — Ambulatory Visit: Payer: Medicare Other | Admitting: Orthotics

## 2018-05-07 DIAGNOSIS — M79676 Pain in unspecified toe(s): Secondary | ICD-10-CM

## 2018-05-07 DIAGNOSIS — B351 Tinea unguium: Secondary | ICD-10-CM

## 2018-05-07 DIAGNOSIS — M21072 Valgus deformity, not elsewhere classified, left ankle: Secondary | ICD-10-CM

## 2018-05-07 DIAGNOSIS — M129 Arthropathy, unspecified: Secondary | ICD-10-CM

## 2018-05-07 DIAGNOSIS — M778 Other enthesopathies, not elsewhere classified: Secondary | ICD-10-CM

## 2018-05-07 DIAGNOSIS — M21062 Valgus deformity, not elsewhere classified, left knee: Secondary | ICD-10-CM

## 2018-05-07 DIAGNOSIS — R600 Localized edema: Secondary | ICD-10-CM

## 2018-05-07 DIAGNOSIS — M779 Enthesopathy, unspecified: Principal | ICD-10-CM

## 2018-05-07 DIAGNOSIS — E1140 Type 2 diabetes mellitus with diabetic neuropathy, unspecified: Secondary | ICD-10-CM

## 2018-05-07 NOTE — Progress Notes (Signed)
Patient presents today for evaluation/casting for AFO brace (L).   Patient has hx of the following conditions: Gait instability,  Ankle instabilty,  Gait analysis done and patient displays abnormality of gait in both sagittial and frontal planes, and could benefit in aggressive ankle support.  Patient chose [place] brace w/ lace/speed laces.  

## 2018-05-24 ENCOUNTER — Other Ambulatory Visit: Payer: Self-pay | Admitting: Podiatry

## 2018-05-27 ENCOUNTER — Other Ambulatory Visit: Payer: Self-pay | Admitting: Family Medicine

## 2018-05-27 DIAGNOSIS — E1140 Type 2 diabetes mellitus with diabetic neuropathy, unspecified: Secondary | ICD-10-CM

## 2018-06-04 ENCOUNTER — Encounter: Payer: Self-pay | Admitting: Podiatry

## 2018-06-04 ENCOUNTER — Ambulatory Visit: Payer: Medicare Other | Admitting: Podiatry

## 2018-06-04 ENCOUNTER — Ambulatory Visit (INDEPENDENT_AMBULATORY_CARE_PROVIDER_SITE_OTHER): Payer: Self-pay | Admitting: Orthotics

## 2018-06-04 DIAGNOSIS — B351 Tinea unguium: Secondary | ICD-10-CM | POA: Diagnosis not present

## 2018-06-04 DIAGNOSIS — M779 Enthesopathy, unspecified: Secondary | ICD-10-CM

## 2018-06-04 DIAGNOSIS — M778 Other enthesopathies, not elsewhere classified: Secondary | ICD-10-CM

## 2018-06-04 DIAGNOSIS — L859 Epidermal thickening, unspecified: Secondary | ICD-10-CM

## 2018-06-04 DIAGNOSIS — E1142 Type 2 diabetes mellitus with diabetic polyneuropathy: Secondary | ICD-10-CM | POA: Diagnosis not present

## 2018-06-04 DIAGNOSIS — M79676 Pain in unspecified toe(s): Secondary | ICD-10-CM | POA: Diagnosis not present

## 2018-06-04 DIAGNOSIS — M21072 Valgus deformity, not elsewhere classified, left ankle: Secondary | ICD-10-CM | POA: Diagnosis not present

## 2018-06-04 DIAGNOSIS — L84 Corns and callosities: Secondary | ICD-10-CM

## 2018-06-04 MED ORDER — TRIAMCINOLONE ACETONIDE 0.05 % EX OINT: TOPICAL_OINTMENT | CUTANEOUS | 1 refills | Status: DC

## 2018-06-04 NOTE — Patient Instructions (Signed)

## 2018-06-05 NOTE — Progress Notes (Signed)
Patient picked up brace (AFO); it offered the support as intended in the frontal plane..  patient reported no issues in comfort; however she did express that perhaps she would need a larger shoe to be able to get it on off.  Proper break in was explained.

## 2018-06-19 LAB — HM MAMMOGRAPHY

## 2018-06-22 NOTE — Progress Notes (Signed)
Subjective: Kayla [name] is seen today for preventative foot care.  She has known history of diabetes, diabetic neuropathy and cc of painful, discolored, thick toenails and painful heel calluses which interfere with activities of daily living.  Painful toenails are aggravated when wearing enclosed shoe gear. Pain painful calluses are aggravated by weightbearing with or without shoe gear. Both conditions respond to periodic professional debridement.  Objective: Vascular Examination: Capillary refill time immediate to all 10 digits Dorsalis pedis and Posterior tibial pulses present b/l Pedal hair growth normal Skin temperature gradient within normal limits bilaterally  Dermatological Examination: Skin with normal turgor, texture and tone b/l Toenails 1-5 b/l discolored, thick, dystrophic with subungual debris and pain with palpation to nailbeds due to thickness of nails. Hyperkeratotic lesions noted plantar central heel as well as circumference of both heels bilaterally.  Musculoskeletal: Muscle strength 5/5 to all LE muscle groups  Neurological: Sensation intact with 10 gram monofilament. Vibratory sensation intact  Assessment: 1. Painful onychomycosis toenails 1-5 b/l 2. Callus/Corn  3. NIDDM with Diabetic neuropathy  Plan: 1. Continue diabetic foot care principles.  2. Toenails 1-5 b/l were debrided in length and girth without iatrogenic bleeding. 3. Hyperkeratotic lesion pared with sterile chisel blade bilateral heels.  Prescription written for triamcinolone ointment 0.005%.  Ointment is to be applied to rough skin on heels twice daily. 4. Patient to continue soft, supportive shoe gear 5. Patient to report any pedal injuries to medical professional  6. Follow up 3 months. Patient/POA to call should there be a concern in the interim.

## 2018-07-01 ENCOUNTER — Other Ambulatory Visit: Payer: Self-pay | Admitting: Podiatry

## 2018-07-01 ENCOUNTER — Ambulatory Visit: Payer: Medicare Other | Admitting: Orthotics

## 2018-07-01 DIAGNOSIS — M21072 Valgus deformity, not elsewhere classified, left ankle: Secondary | ICD-10-CM

## 2018-07-01 DIAGNOSIS — M79676 Pain in unspecified toe(s): Principal | ICD-10-CM

## 2018-07-01 DIAGNOSIS — M778 Other enthesopathies, not elsewhere classified: Secondary | ICD-10-CM

## 2018-07-01 DIAGNOSIS — B351 Tinea unguium: Secondary | ICD-10-CM

## 2018-07-01 DIAGNOSIS — M779 Enthesopathy, unspecified: Secondary | ICD-10-CM

## 2018-07-01 NOTE — Progress Notes (Signed)
Going to send brace back to get VELCRO bottom instead of lace.

## 2018-07-02 ENCOUNTER — Emergency Department (HOSPITAL_COMMUNITY)
Admission: EM | Admit: 2018-07-02 | Discharge: 2018-07-02 | Disposition: A | Discharge status: Home / Self Care | Payer: Medicare Other | Attending: Emergency Medicine | Admitting: Emergency Medicine

## 2018-07-02 ENCOUNTER — Other Ambulatory Visit: Payer: Self-pay

## 2018-07-02 DIAGNOSIS — Z7982 Long term (current) use of aspirin: Secondary | ICD-10-CM | POA: Diagnosis not present

## 2018-07-02 DIAGNOSIS — Z7984 Long term (current) use of oral hypoglycemic drugs: Secondary | ICD-10-CM | POA: Insufficient documentation

## 2018-07-02 DIAGNOSIS — Z79899 Other long term (current) drug therapy: Secondary | ICD-10-CM | POA: Diagnosis not present

## 2018-07-02 DIAGNOSIS — I10 Essential (primary) hypertension: Secondary | ICD-10-CM | POA: Insufficient documentation

## 2018-07-02 DIAGNOSIS — M542 Cervicalgia: Secondary | ICD-10-CM | POA: Diagnosis present

## 2018-07-02 DIAGNOSIS — E119 Type 2 diabetes mellitus without complications: Secondary | ICD-10-CM | POA: Diagnosis not present

## 2018-07-02 MED ORDER — METHOCARBAMOL 500 MG PO TABS
750.0000 mg | ORAL_TABLET | Freq: Once | ORAL | Status: AC
  Administered 2018-07-02: 750 mg via ORAL
  Filled 2018-07-02: qty 2

## 2018-07-02 MED ORDER — METHOCARBAMOL 500 MG PO TABS: 500.0000 mg | ORAL_TABLET | Freq: Two times a day (BID) | ORAL | 0 refills | Status: DC

## 2018-07-02 NOTE — ED Triage Notes (Signed)
Pt reports left side neck pain X2 days. Pt states she woke up with the pain yesterday and it has gotten worse today. She denies chest pain or shortness of breath.

## 2018-07-02 NOTE — ED Notes (Signed)
Discharge instructions discussed with Pt. Pt verbalized understanding. Pt stable and leaving via WC.    

## 2018-07-02 NOTE — Discharge Instructions (Addendum)
You were evaluated today for neck pain.  This is most likely musculoskeletal in nature.  I will send you home with muscle relaxers for your pain.  Please be careful as these may make you tired.  Please follow-up with your PCP as well as ENT for your chronic ear pain.  Return to the ED for any new or worsening symptoms.

## 2018-07-02 NOTE — ED Provider Notes (Signed)
[facility] EMERGENCY DEPARTMENT Provider Note   CSN: [id number] Arrival date & time: 07/02/18  1411   History   Chief Complaint Chief Complaint  Patient presents with  . Neck Pain    HPI Kayla [name] is a 77 y.o. female with past medical history significant for hx of PE, hypertension, diabetes who presents for evaluation of neck pain. Neck pain started approximately 36 hours ago.  Patient states she woke up with the pain.  Pain is located to the left side of her neck. States movement makes the pain worse.  Patient states pain was initially intermittent in nature, this morning when she woke up the pain is been constant.  Denies alleviating factors.  Rates her pain a 6/10.  Has not taken anything for her pain.  Denies fever, chills, nausea, vomiting, headache, blurred vision, chest pain, shortness of breath.  Patient admits to occasional left ear pain.  Pain has been ongoing for the last 2 years and she started wearing hearing aids in her left ear.  Denies drainage to ear.  Denies recent trauma or injury to neck.  Denies midline cervical neck pain, numbness/tingling in bilateral upper and lower extremities, slurred speech, weakness.  Denies radiation of pain.  Describes her pain as a spasms and aching sensation.  Denies history of known carotid artery stenosis or carotid bruit.  History obtained from patient.  No interpreter was used.  HPI  Past Medical History:  Diagnosis Date  . Arthritis   . Diabetes mellitus   . Hypertension   . Obesity   . Pulmonary embolus [facility])     Patient Active Problem List   Diagnosis Date Noted  . Gastroesophageal reflux disease without esophagitis 04/16/2018  . Dependent edema 01/01/2017  . Anemia of chronic illness 02/15/2015  . Multiple falls 02/15/2015  . Diabetic neuropathy, type II diabetes mellitus ([place]) 09/09/2012  . Hyperlipidemia associated with type 2 diabetes mellitus ([place]) 05/22/2011  . Hypertension associated with  diabetes ([place]) 05/22/2011  . Obesity 12/14/2010  . Diabetes mellitus type 2 with complications ([place]) 19/37/9024  . History of pulmonary embolus ([place]) 12/14/2010    Past Surgical History:  Procedure Laterality Date  . ABDOMINAL HYSTERECTOMY    . CHOLECYSTECTOMY    . JOINT REPLACEMENT  Right     OB History   None      Home Medications    Prior to Admission medications   Medication Sig Start Date End Date Taking? Authorizing Provider  aspirin ([employer] ASPIRIN EC) 325 MG EC tablet USE PER THE INSTRUCTIONS ON PACKAGE LABEL 03/01/18   Denita Lung, MD  aspirin EC 325 MG tablet USE PER THE INSTRUCTIONS ON PACKAGE LABEL 01/01/17   Denita Lung, MD  carvedilol (COREG) 3.125 MG tablet TAKE 1 TABLET BY MOUTH TWICE A DAY 04/19/18   Denita Lung, MD  diclofenac sodium (VOLTAREN) 1 % GEL Apply 2 g topically 4 (four) times daily.    [provider]  glucose blood (ONE TOUCH ULTRA TEST) test strip Test once a day (THIS IS FOR ONE TOUCH ULTRA ) 05/02/17   Denita Lung, MD  losartan-hydrochlorothiazide (HYZAAR) 50-12.5 MG tablet TAKE 1 TABLET BY MOUTH DAILY. 11/29/17   Denita Lung, MD  meloxicam (MOBIC) 7.5 MG tablet TAKE 1 TABLET BY MOUTH EVERY DAY 05/24/18   Evelina Bucy, DPM  metFORMIN (GLUCOPHAGE) 500 MG tablet TAKE 1 TABLET BY MOUTH TWO TIMES DAILY WITH MEALS 05/27/18   Denita Lung, MD  methocarbamol (ROBAXIN) 500 MG tablet Take 1 tablet (500 mg total) by mouth 2 (two) times daily. 07/02/18   Nilah Belcourt A, PA-C  Multiple Vitamins-Minerals (MULTIVITAMIN WITH MINERALS) tablet Take 1 tablet by mouth daily.    [provider]  pantoprazole (PROTONIX) 20 MG tablet TAKE 1 TABLET BY MOUTH EVERY DAY 02/04/18   Leonie Man, MD  TRIAMCINOLONE ACETONIDE, TOP, 0.05 % OINT Apply to rough skin on heels twice daily 06/04/18   Marzetta Board, DPM    Family History Family History  Problem Relation Age of Onset  . Hypertension Mother     Social History Social  History   Tobacco Use  . Smoking status: Never Smoker  . Smokeless tobacco: Never Used  Substance Use Topics  . Alcohol use: No  . Drug use: No     Allergies   Patient has no known allergies.   Review of Systems Review of Systems  Constitutional: Negative for activity change, appetite change, chills, fatigue, fever and unexpected weight change.  HENT: Positive for ear pain. Negative for congestion, dental problem, drooling, ear discharge, facial swelling, mouth sores, nosebleeds, postnasal drip, rhinorrhea, sinus pressure, sinus pain, sneezing, sore throat, tinnitus, trouble swallowing and voice change.        Chronic left ear hearing loss, wears hearing aid.  Respiratory: Negative for cough, choking, chest tightness, shortness of breath, wheezing and stridor.   Cardiovascular: Negative for chest pain, palpitations and leg swelling.  Gastrointestinal: Negative for abdominal pain, nausea and vomiting.  Musculoskeletal: Positive for neck pain. Negative for arthralgias, back pain, gait problem, joint swelling, myalgias and neck stiffness.  Skin: Negative.   Neurological: Negative for dizziness, tremors, syncope, facial asymmetry, speech difficulty, weakness, light-headedness, numbness and headaches.  All other systems reviewed and are negative.    Physical Exam Updated Vital Signs BP (!) 140/59 (BP Location: Right Arm)   Pulse (!) 103   Temp 98.9 F (37.2 C) (Oral)   Resp 16   SpO2 98%   Physical Exam  Constitutional: She appears well-developed and well-nourished.  Non-toxic appearance. She does not have a sickly appearance. She does not appear ill. No distress.  HENT:  Head: Normocephalic and atraumatic. Head is without Battle's sign, without abrasion, without contusion, without right periorbital erythema and without left periorbital erythema.  Right Ear: Tympanic membrane, external ear and ear canal normal. No drainage, swelling or tenderness. No foreign bodies. No mastoid  tenderness. Tympanic membrane is not scarred, not perforated, not erythematous, not retracted and not bulging. No middle ear effusion. No hemotympanum.  Left Ear: No drainage or swelling. No mastoid tenderness. Tympanic membrane is perforated.  No middle ear effusion. No hemotympanum.  Nose: Nose normal. Right sinus exhibits no maxillary sinus tenderness and no frontal sinus tenderness. Left sinus exhibits no maxillary sinus tenderness and no frontal sinus tenderness.  Mouth/Throat: Uvula is midline, oropharynx is clear and moist and mucous membranes are normal. No trismus in the jaw. No uvula swelling. No oropharyngeal exudate, posterior oropharyngeal edema, posterior oropharyngeal erythema or tonsillar abscesses. No tonsillar exudate.  No mastoid tenderness, no edema, erythema to mastoids.  Eyes: Pupils are equal, round, and reactive to light.  No horizontal, vertical or rotational nystagmus   Neck: Trachea normal, normal range of motion, full passive range of motion without pain and phonation normal. Neck supple. Normal carotid pulses and no JVD present. No tracheal tenderness, no spinous process tenderness and no muscular tenderness present. Carotid bruit is not present. No neck rigidity.  No edema, no erythema and normal range of motion present. No Brudzinski's sign and no Kernig's sign noted.  No midline neck tenderness.  Tenderness to palpation over left lateral neck over left trapezius. No right sided neck tenderness to palpation.  No decreased  range of motion.  No spinal process tenderness.  No neck rigidity, erythema or edema. Full active and passive ROM without pain. No midline or paraspinal tenderness. No nuchal rigidity or meningeal signs. No mastoid tenderness.  Cardiovascular: Normal heart sounds, intact distal pulses and normal pulses. Tachycardia present.  Pulses:      Carotid pulses are 2+ on the right side, and 2+ on the left side.      Radial pulses are 2+ on the right side, and 2+ on  the left side.       Posterior tibial pulses are 2+ on the right side, and 2+ on the left side.  No carotid artery bruit.  No JVD.  Pulmonary/Chest: Effort normal and breath sounds normal. No accessory muscle usage or stridor. No respiratory distress. She has no decreased breath sounds. She has no wheezes. She has no rhonchi. She has no rales.  Tachypnea at 103 initial set of vital signs, patient HR 81 on my initial evaluation.  Abdominal: Soft. Normal appearance. She exhibits no distension, no pulsatile liver, no fluid wave, no ascites, no pulsatile midline mass and no mass. There is no tenderness. There is no rigidity, no rebound, no guarding, no CVA tenderness, no tenderness at McBurney's point and negative Murphy's sign.  Musculoskeletal: Normal range of motion.  Lymphadenopathy:       Head (right side): No submental, no submandibular, no tonsillar, no posterior auricular and no occipital adenopathy present.       Head (left side): No submental, no submandibular, no tonsillar, no posterior auricular and no occipital adenopathy present.    She has no cervical adenopathy.       Right: No supraclavicular adenopathy present.       Left: No supraclavicular adenopathy present.  Neurological: She is alert. She has normal strength. No cranial nerve deficit or sensory deficit. She exhibits normal muscle tone.  Mental Status:  Alert, oriented, thought content appropriate. Speech fluent without evidence of aphasia. Able to follow 2 step commands without difficulty.  Cranial Nerves:  II:  Peripheral visual fields grossly normal, pupils equal, round, reactive to light III,IV, VI: ptosis not present, extra-ocular motions intact bilaterally  V,VII: smile symmetric, facial light touch sensation equal VIII: hearing grossly normal bilaterally  IX,X: midline uvula rise  XI: bilateral shoulder shrug equal and strong XII: midline tongue extension  Motor:  5/5 in upper and lower extremities bilaterally  including strong and equal grip strength and dorsiflexion/plantar flexion Sensory: Pinprick and light touch normal in all extremities.  Deep Tendon Reflexes: 2+ and symmetric  Cerebellar: normal finger-to-nose with bilateral upper extremities Gait: normal gait and balance CV: distal pulses palpable throughout    Skin: Skin is warm and dry. She is not diaphoretic.  No edema, erythema, ecchymosis or warmth.  Psychiatric: She has a normal mood and affect.  Nursing note and vitals reviewed.      ED Treatments / Results  Labs (all labs ordered are listed, but only abnormal results are displayed) Labs Reviewed - No data to display  EKG None  Radiology No results found.  Procedures Procedures (including critical care time)  Medications Ordered in ED Medications  methocarbamol (ROBAXIN) tablet 750 mg (750 mg Oral Given 07/02/18 1607)  Initial Impression / Assessment and Plan / ED Course  I have reviewed the triage vital signs and the nursing notes.  Pertinent labs & imaging results that were available during my care of the patient were reviewed by me and considered in my medical decision making (see chart for details).  77 year old who presents for evaluation of neck pain x 36 hours. Afebrile, non-septic, non ill appearing.  Lungs clear to auscultation bilaterally, heart clear without rubs, murmurs or gallops.  Pain to left lateral neck upon waking approximately 36 hours ago.  Describes pain as spasm and aching.  Tenderness to left trapezius muscle. Neck Full ROM without rigidity, stiffness or pain. No tenderness to mastoid. Right ear normal. Left ear with rupture membrane, no evidence of otitis externa or otitis media.  Patient states she has had issues with her left ear for approximately 2 years since she has started wearing hearing aids in her left ear.  Patient states she has had persistent pain for the last 2 years, this is not a new issue. Nonfocal neurologic exam without  neurologic deficits. No HA or vision changes. Denies trauma or injury to neck. No recent MVC.  Patient most likely with musculoskeletal pain to her left trapezius. Low suspicion for emergent pathology causing patient's pain.  Low suspicion for carotid artery etiology, mastoiditis, otitis externa or otitis media, discitis, osteomyelitis, transverse myelitis, acute fracture of cervical spine, meningitis.  Will DC home with muscle relaxers.  Patient is currently on Mobic.  Does not need additional NSAIDs at this time.  Discussed with family members follow-up with PCP as well as ENT for patient's left ear pain with her hearing aids. Discussed strict return precautions.  Patient is hemodynamically stable and appropriate for DC home at this time.  Patient and family voiced understanding and are agreeable for follow-up.   Patient was seen and evaluated by my attending, Dr. Sedonia Small who agrees with the treatment, plan and disposition of patient.   Final Clinical Impressions(s) / ED Diagnoses   Final diagnoses:  Neck pain    ED Discharge Orders         Ordered    methocarbamol (ROBAXIN) 500 MG tablet  2 times daily,   Status:  Discontinued     07/02/18 1610    methocarbamol (ROBAXIN) 500 MG tablet  2 times daily     07/02/18 1615           Carylon Tamburro A, PA-C 07/02/18 1626    Maudie Flakes, MD 07/02/18 2011

## 2018-07-03 ENCOUNTER — Ambulatory Visit (INDEPENDENT_AMBULATORY_CARE_PROVIDER_SITE_OTHER): Payer: Medicare Other | Admitting: Podiatry

## 2018-07-03 DIAGNOSIS — M24176 Other articular cartilage disorders, unspecified foot: Secondary | ICD-10-CM

## 2018-07-03 DIAGNOSIS — M21072 Valgus deformity, not elsewhere classified, left ankle: Secondary | ICD-10-CM | POA: Diagnosis not present

## 2018-07-03 DIAGNOSIS — M249 Joint derangement, unspecified: Secondary | ICD-10-CM

## 2018-07-03 DIAGNOSIS — R600 Localized edema: Secondary | ICD-10-CM | POA: Diagnosis not present

## 2018-07-03 DIAGNOSIS — G8929 Other chronic pain: Secondary | ICD-10-CM

## 2018-07-03 DIAGNOSIS — M21062 Valgus deformity, not elsewhere classified, left knee: Secondary | ICD-10-CM | POA: Diagnosis not present

## 2018-07-03 DIAGNOSIS — M129 Arthropathy, unspecified: Secondary | ICD-10-CM | POA: Diagnosis not present

## 2018-07-03 DIAGNOSIS — M24173 Other articular cartilage disorders, unspecified ankle: Secondary | ICD-10-CM

## 2018-07-03 DIAGNOSIS — M25572 Pain in left ankle and joints of left foot: Secondary | ICD-10-CM

## 2018-07-03 NOTE — Progress Notes (Signed)
Subjective:  Patient ID: Kayla [name], female    DOB: [birth year],  MRN: [id number]  Chief Complaint  Patient presents with  . Foot Pain     meloxicam did not work still having pain/discuss possible referal for knee pain    77 y.o. female presents with the above complaint.  Patient still having severe pain to the ankle did not get her custom brace yet.  Presents with her son today who assists and is asking a lot of questions regarding her plan of care.  Review of Systems: Negative except as noted in the HPI. Denies N/V/F/Ch.  Past Medical History:  Diagnosis Date  . Arthritis   . Diabetes mellitus   . Hypertension   . Obesity   . Pulmonary embolus (HCC)     Current Outpatient Medications:  .  atorvastatin (LIPITOR) 80 MG tablet, Take 1 tablet by mouth  daily at 6PM, Disp: , Rfl:  .  aspirin ([employer] ASPIRIN EC) 325 MG EC tablet, USE PER THE INSTRUCTIONS ON PACKAGE LABEL, Disp: 300 tablet, Rfl: 1 .  aspirin EC 325 MG tablet, USE PER THE INSTRUCTIONS ON PACKAGE LABEL, Disp: 100 tablet, Rfl: 3 .  carvedilol (COREG) 3.125 MG tablet, TAKE 1 TABLET BY MOUTH TWICE A DAY, Disp: 180 tablet, Rfl: 3 .  diclofenac sodium (VOLTAREN) 1 % GEL, Apply 2 g topically 4 (four) times daily., Disp: , Rfl:  .  glucose blood (ONE TOUCH ULTRA TEST) test strip, Test once a day (THIS IS FOR ONE TOUCH ULTRA ), Disp: 100 each, Rfl: 5 .  losartan-hydrochlorothiazide (HYZAAR) 50-12.5 MG tablet, TAKE 1 TABLET BY MOUTH DAILY., Disp: 90 tablet, Rfl: 2 .  meloxicam (MOBIC) 7.5 MG tablet, TAKE 1 TABLET BY MOUTH EVERY DAY, Disp: 30 tablet, Rfl: 0 .  metFORMIN (GLUCOPHAGE) 500 MG tablet, TAKE 1 TABLET BY MOUTH TWO TIMES DAILY WITH MEALS, Disp: 180 tablet, Rfl: 0 .  methocarbamol (ROBAXIN) 500 MG tablet, Take 1 tablet (500 mg total) by mouth 2 (two) times daily., Disp: 20 tablet, Rfl: 0 .  Multiple Vitamins-Minerals (MULTIVITAMIN WITH MINERALS) tablet, Take 1 tablet by mouth daily., Disp: , Rfl:  .  pantoprazole (PROTONIX)  20 MG tablet, TAKE 1 TABLET BY MOUTH EVERY DAY, Disp: 90 tablet, Rfl: 0 .  TRIAMCINOLONE ACETONIDE, TOP, 0.05 % OINT, Apply to rough skin on heels twice daily, Disp: 430 g, Rfl: 1 .  triamcinolone ointment (KENALOG) 0.5 %, APPLY TO ROUGH SKIN ON HEELS TWICE DAILY, Disp: , Rfl: 1  Social History   Tobacco Use  Smoking Status Never Smoker  Smokeless Tobacco Never Used    No Known Allergies Objective:  There were no vitals filed for this visit. There is no height or weight on file to calculate BMI. Constitutional Well developed. Well nourished.  Vascular Dorsalis pedis pulses palpable bilaterally. Posterior tibial pulses palpable bilaterally. Capillary refill normal to all digits.  No cyanosis or clubbing noted. Pedal hair growth normal.  Neurologic Normal speech. Oriented to person, place, and time. Epicritic sensation to light touch grossly present bilaterally.  Dermatologic Nails well groomed and normal in appearance. No open wounds. No skin lesions.  Orthopedic: Normal joint ROM without pain or crepitus bilaterally. Severe pes planus with left ankle valgus Left knee valgus deformity noted Pain palpation about the medial lateral aspect of the left ankle and at the ATFL left   Radiographs:  9/20: Ankle valgus with degenerative changes to the ankle.  Severe pes planus. Assessment:   1. Acquired valgus deformity  of left ankle   2. Chronic arthropathy   3. Acquired valgus deformity knee, left   4. Localized edema   5. Chronic pain of left ankle   6. Joint derangement of ankle or foot    Plan:  Patient was evaluated and treated and all questions answered.  Pes planus with ankle valgus and degenerative changes -Lengthy discussion had with patient and her son today about treatment plan.  Advised that fall the ankle deformity is somewhat abrasive although I think that she has significant knee deformity which is more likely contributing to her pain in her ankle in any effort a  brace in the ankle is not ideal without addressing the knee.  I have discussed this with patient multiple times.  Today her son seems more inclined to want to get this taken care of.  Request referral to a knee specialist for evaluation.  Patient son asked many questions about the knee procedure and what we have to be done and I advised that he defer these questions to the knee specialist.  30 minutes of face to face time were spent with the patient. >50% of this was spent on counseling and coordination of care. Specifically discussed with patient the above diagnoses and overall treatment plan.   Return in about 6 weeks (around 08/14/2018) for left foot/ankle pain.

## 2018-07-05 ENCOUNTER — Telehealth: Payer: Self-pay | Admitting: Medical

## 2018-07-05 ENCOUNTER — Other Ambulatory Visit: Payer: Self-pay | Admitting: Family Medicine

## 2018-07-05 ENCOUNTER — Ambulatory Visit: Payer: Medicare Other | Admitting: Medical

## 2018-07-05 VITALS — BP 130/74 | HR 92 | Temp 98.5°F | Resp 16

## 2018-07-05 DIAGNOSIS — M549 Dorsalgia, unspecified: Secondary | ICD-10-CM | POA: Diagnosis not present

## 2018-07-05 DIAGNOSIS — M436 Torticollis: Secondary | ICD-10-CM | POA: Diagnosis not present

## 2018-07-05 DIAGNOSIS — M62838 Other muscle spasm: Secondary | ICD-10-CM | POA: Diagnosis not present

## 2018-07-05 DIAGNOSIS — H6122 Impacted cerumen, left ear: Secondary | ICD-10-CM

## 2018-07-05 DIAGNOSIS — R5383 Other fatigue: Secondary | ICD-10-CM

## 2018-07-05 DIAGNOSIS — R0982 Postnasal drip: Secondary | ICD-10-CM

## 2018-07-05 DIAGNOSIS — J3489 Other specified disorders of nose and nasal sinuses: Secondary | ICD-10-CM

## 2018-07-05 NOTE — Patient Instructions (Signed)
Neck pain, neck spasm, right neck  Over the next 3 to 4 days you can use heat pad or warm towel to the neck as needed  Use some gentle stretching of your neck and shoulders and upper back  You can use topical icy hot if desired to the neck  You were prescribed Robaxin by the emergency department.  This can help the spasm but can also make you sleepy.  I would only use this at bedtime the next day or 2  You may use over-the-counter Tylenol 500mg  once or twice daily for pain over the weekend  If not much improved by Monday, call back and we can refer to physical therapy  Sinus pressure, congestion  Begin over-the-counter Mucinex once or twice daily for the next 3 to 4 days to expel mucus  Make sure you are drinking plenty of water throughout the day  If not much improved by mid next week then I would recommend beginning a round of antibiotic for sinus infection

## 2018-07-05 NOTE — Telephone Encounter (Signed)
Have her son bring her in for labs.   We got to talking and I forgot to have Verdis Frederickson come in and draw blood.

## 2018-07-05 NOTE — Progress Notes (Signed)
Subjective: Chief Complaint  Patient presents with  . neck pain    neck pain X Tuesday   Kayla Lung, MD here for primary care.  Accompanied by her son today  Here for neck pain x4 days.  She initially went to the emergency department on 07/02/2018 for the same.  According to the emergency department notes she presented for neck pain x36 hours on 07/02/2018 awaking with the pain, left side of her neck painful, movement was making the pain worse.  At that time she denied fever, nausea, vomiting, headache, blurred vision, chest pain, shortness of breath.  She also commented on some left ear pain.  She has been wearing hearing aids for 2 years.  Denies ear drainage.  No recent neck injury or trauma.  No paresthesias of upper extremities or arm pain.  Describes her pain as spasms or aching sensation.  Today she reports ongoing pain on left side of neck.   Has some intermittent headache.    Throat staying full of mucous, gets some nasal congestion, hx/o sinus issues.   No fever.  No sore throat, no cough.  Is having some left ear pain.  No arm pain.  Does get some arthritis pains in hands.    No shoulder pain but does have some upper left back pain.   Was prescribed robaxin muscle relaxer, took one today about an hour ago.  Took some robaxin last night as well.   Has only minimal improvement in symptoms.     She does have some ongoing sinus pressure, thick mucous, takes and hour seemingly to get out mucous in the mornings, but no fever, no sore throat, no cough, no NVD.  There was concern for perforated TM on left.   Used ice pack on neck last night.    Past Medical History:  Diagnosis Date  . Arthritis   . Diabetes mellitus   . Hypertension   . Obesity   . Pulmonary embolus [facility])    Current Outpatient Medications on File Prior to Visit  Medication Sig Dispense Refill  . aspirin ([employer]S ASPIRIN EC) 325 MG EC tablet USE PER THE INSTRUCTIONS ON PACKAGE LABEL 300 tablet 1  . atorvastatin  (LIPITOR) 80 MG tablet Take 1 tablet by mouth  daily at 6PM    . carvedilol (COREG) 3.125 MG tablet TAKE 1 TABLET BY MOUTH TWICE A DAY 180 tablet 3  . glucose blood (ONE TOUCH ULTRA TEST) test strip TEST ONCE A DAY (THIS IS FOR ONE TOUCH ULTRA ) 100 each 2  . losartan-hydrochlorothiazide (HYZAAR) 50-12.5 MG tablet TAKE 1 TABLET BY MOUTH DAILY. 90 tablet 2  . metFORMIN (GLUCOPHAGE) 500 MG tablet TAKE 1 TABLET BY MOUTH TWO TIMES DAILY WITH MEALS 180 tablet 0  . methocarbamol (ROBAXIN) 500 MG tablet Take 1 tablet (500 mg total) by mouth 2 (two) times daily. 20 tablet 0  . Multiple Vitamins-Minerals (MULTIVITAMIN WITH MINERALS) tablet Take 1 tablet by mouth daily.    . TRIAMCINOLONE ACETONIDE, TOP, 0.05 % OINT Apply to rough skin on heels twice daily 430 g 1  . diclofenac sodium (VOLTAREN) 1 % GEL Apply 2 g topically 4 (four) times daily.    . meloxicam (MOBIC) 7.5 MG tablet TAKE 1 TABLET BY MOUTH EVERY DAY (Patient not taking: Reported on 07/05/2018) 30 tablet 0  . pantoprazole (PROTONIX) 20 MG tablet TAKE 1 TABLET BY MOUTH EVERY DAY (Patient not taking: Reported on 07/05/2018) 90 tablet 0   No current facility-administered medications on  file prior to visit.    ROS as in subjective   Objective: BP 130/74   Pulse 92   Temp 98.5 F (36.9 C) (Oral)   Resp 16   SpO2 96%   Gen: wd, wn, nad Skin unremarkable of neck and back Tender left lateral neck and left upper back paraspinal, she has decreased left range of motion in general but certainly worse with left rotation Otherwise no mass no lymphadenopathy no thyromegaly HEENT with moderate cerumen left TM, right TMs, normal conjunctiva nares patent no erythema or mucus, pharynx normal Upper arms without tenderness, relatively normal range of motion no swelling or deformity Arms neurovascularly intact Psych: Pleasant, answers questions appropriately, alert and oriented x3   Assessment: Encounter Diagnoses  Name Primary?  . Torticollis Yes   . Neck muscle spasm   . Upper back pain   . Impacted cerumen of left ear   . Sinus pressure   . Post-nasal drainage   . Fatigue, unspecified type      Plan: Discussed findings.  Discussed risk/benefits of procedure and patient agrees to procedure. Successfully used warm water lavage to remove impacted cerumen from left ear canal. Patient tolerated procedure well. Advised they avoid using any cotton swabs or other devices to clean the ear canals.  Use basic hygiene as discussed.    Reviewed the folliwng with patient. Patient Instructions  Neck pain, neck spasm, right neck  Over the next 3 to 4 days you can use heat pad or warm towel to the neck as needed  Use some gentle stretching of your neck and shoulders and upper back  You can use topical icy hot if desired to the neck  You were prescribed Robaxin by the emergency department.  This can help the spasm but can also make you sleepy.  I would only use this at bedtime the next day or 2  You may use over-the-counter Tylenol 500mg  once or twice daily for pain over the weekend  If not much improved by Monday, call back and we can refer to physical therapy  Sinus pressure, congestion  Begin over-the-counter Mucinex once or twice daily for the next 3 to 4 days to expel mucus  Make sure you are drinking plenty of water throughout the day  If not much improved by mid next week then I would recommend beginning a round of antibiotic for sinus infection     Fatigue - advised labs.  Inadvertently, patient left before phlebotomist had a chance to come in for lab draw.   Foye was seen today for neck pain.  Diagnoses and all orders for this visit:  Torticollis  Neck muscle spasm  Upper back pain  Impacted cerumen of left ear  Sinus pressure  Post-nasal drainage  Fatigue, unspecified type -     Cancel: Comprehensive metabolic panel -     Cancel: CBC with Differential/Platelet -     Comprehensive metabolic panel;  Future -     CBC with Differential/Platelet; Future

## 2018-07-08 NOTE — Telephone Encounter (Signed)
Pt is coming in Tuesday will get labs then

## 2018-07-09 ENCOUNTER — Ambulatory Visit (INDEPENDENT_AMBULATORY_CARE_PROVIDER_SITE_OTHER): Payer: Medicare Other | Admitting: Medical

## 2018-07-09 VITALS — BP 130/80 | HR 88 | Temp 98.4°F | Resp 16 | Ht 64.0 in | Wt 235.6 lb

## 2018-07-09 DIAGNOSIS — R5383 Other fatigue: Secondary | ICD-10-CM

## 2018-07-09 DIAGNOSIS — M436 Torticollis: Secondary | ICD-10-CM | POA: Diagnosis not present

## 2018-07-09 DIAGNOSIS — M62838 Other muscle spasm: Secondary | ICD-10-CM | POA: Diagnosis not present

## 2018-07-09 DIAGNOSIS — M542 Cervicalgia: Secondary | ICD-10-CM | POA: Diagnosis not present

## 2018-07-09 DIAGNOSIS — J3489 Other specified disorders of nose and nasal sinuses: Secondary | ICD-10-CM

## 2018-07-09 DIAGNOSIS — R0982 Postnasal drip: Secondary | ICD-10-CM

## 2018-07-09 MED ORDER — AMOXICILLIN 875 MG PO TABS: 875.0000 mg | ORAL_TABLET | Freq: Two times a day (BID) | ORAL | 0 refills | Status: DC

## 2018-07-09 NOTE — Progress Notes (Signed)
Subjective: Chief Complaint  Patient presents with  . neck spasms    neck spasms doing better but still bothering her   Denita Lung, MD here for primary care.  Here for recheck.  I saw her last week for neck pain.   Has had slight improvement with heat and tylenol and stretching over weekend.   Still now back to normal.  No shoulder pain but does have some upper left back pain  Still having thick mucous in throat, congestion.   Using mucinex without improvement.  She does have some ongoing sinus pressure, thick mucous, takes and hour seemingly to get out mucous in the mornings, but no fever, no sore throat, no cough, no NVD.   Past Medical History:  Diagnosis Date  . Arthritis   . Diabetes mellitus   . Hypertension   . Obesity   . Pulmonary embolus [facility])    Current Outpatient Medications on File Prior to Visit  Medication Sig Dispense Refill  . aspirin (CVS ASPIRIN EC) 325 MG EC tablet USE PER THE INSTRUCTIONS ON PACKAGE LABEL 300 tablet 1  . carvedilol (COREG) 3.125 MG tablet TAKE 1 TABLET BY MOUTH TWICE A DAY 180 tablet 3  . glucose blood (ONE TOUCH ULTRA TEST) test strip TEST ONCE A DAY (THIS IS FOR ONE TOUCH ULTRA ) 100 each 2  . losartan-hydrochlorothiazide (HYZAAR) 50-12.5 MG tablet TAKE 1 TABLET BY MOUTH DAILY. 90 tablet 2  . metFORMIN (GLUCOPHAGE) 500 MG tablet TAKE 1 TABLET BY MOUTH TWO TIMES DAILY WITH MEALS 180 tablet 0  . methocarbamol (ROBAXIN) 500 MG tablet Take 1 tablet (500 mg total) by mouth 2 (two) times daily. 20 tablet 0  . Multiple Vitamins-Minerals (MULTIVITAMIN WITH MINERALS) tablet Take 1 tablet by mouth daily.    . TRIAMCINOLONE ACETONIDE, TOP, 0.05 % OINT Apply to rough skin on heels twice daily 430 g 1  . atorvastatin (LIPITOR) 80 MG tablet Take 1 tablet by mouth  daily at 6PM    . diclofenac sodium (VOLTAREN) 1 % GEL Apply 2 g topically 4 (four) times daily.    . meloxicam (MOBIC) 7.5 MG tablet TAKE 1 TABLET BY MOUTH EVERY DAY (Patient not taking:  Reported on 07/05/2018) 30 tablet 0  . pantoprazole (PROTONIX) 20 MG tablet TAKE 1 TABLET BY MOUTH EVERY DAY (Patient not taking: Reported on 07/05/2018) 90 tablet 0   No current facility-administered medications on file prior to visit.    ROS as in subjective   Objective: BP 130/80   Pulse 88   Temp 98.4 F (36.9 C) (Oral)   Resp 16   Ht 5\' 4"  (1.626 m)   Wt 235 lb 9.6 oz (106.9 kg)   SpO2 96%   BMI 40.44 kg/m   Gen: wd, wn, nad Skin unremarkable of neck and back Tender left lateral neck and left upper back paraspinal, she has decreased left range of motion in general but certainly worse with left rotation Otherwise no mass no lymphadenopathy no thyromegaly HEENT with pearly TMs, normal conjunctiva nares patent no erythema or mucus, pharynx with some post nasal drainage Upper arms without tenderness, relatively normal range of motion no swelling or deformity Arms neurovascularly intact Psych: Pleasant, answers questions appropriately, alert and oriented x3   Assessment: Encounter Diagnoses  Name Primary?  . Torticollis Yes  . Neck pain   . Neck muscle spasm   . Fatigue, unspecified type   . Sinus pressure   . Post-nasal drainage  Plan: Discussed symptoms, concerns, and recommendations as below  Patient Instructions  Neck pain and spasm  Please go to [facility] for your neck xray.   Their hours are 8am - 4:30 pm Monday - Friday.  Take your insurance card with you.  [facility] [phone]  [address], [place], [place] [address]  [address], [place] [address]    You can continue heat pad or warm Tylenol as needed, gentle stretching of your neck throughout the day, Tylenol over-the-counter  We will refer you to physical therapy.  We are going to try to get home health physical therapy to come out of your house   Fatigue-we will call with lab results   Sinus pressure, postnasal drainage-begin 10-day trial of  amoxicillin antibiotic to clear the mucus and respiratory tract infection.  You can continue Mucinex over-the-counter, good hydration and salt water gargles to help clear mucus out of the back of the throat    Michala was seen today for neck spasms.  Diagnoses and all orders for this visit:  Torticollis -     DG Cervical Spine Complete; Future  Neck pain -     DG Cervical Spine Complete; Future  Neck muscle spasm -     DG Cervical Spine Complete; Future  Fatigue, unspecified type -     Comprehensive metabolic panel -     CBC with Differential/Platelet  Sinus pressure  Post-nasal drainage  Other orders -     amoxicillin (AMOXIL) 875 MG tablet; Take 1 tablet (875 mg total) by mouth 2 (two) times daily.   f/u pending labs

## 2018-07-09 NOTE — Patient Instructions (Signed)
Neck pain and spasm  Please go to [facility] for your neck xray.   Their hours are 8am - 4:30 pm Monday - Friday.  Take your insurance card with you.  [facility] [phone]  [address], [place], [place] [address]  [address], [place] [address]    You can continue heat pad or warm Tylenol as needed, gentle stretching of your neck throughout the day, Tylenol over-the-counter  We will refer you to physical therapy.  We are going to try to get home health physical therapy to come out of your house   Fatigue-we will call with lab results   Sinus pressure, postnasal drainage-begin 10-day trial of amoxicillin antibiotic to clear the mucus and respiratory tract infection.  You can continue Mucinex over-the-counter, good hydration and salt water gargles to help clear mucus out of the back of the throat

## 2018-07-10 ENCOUNTER — Telehealth: Payer: Self-pay

## 2018-07-10 LAB — COMPREHENSIVE METABOLIC PANEL
ALT: 10 IU/L (ref 0–32)
AST: 10 IU/L (ref 0–40)
Albumin/Globulin Ratio: 1.3 (ref 1.2–2.2)
Albumin: 3.9 g/dL (ref 3.5–4.8)
Alkaline Phosphatase: 90 IU/L (ref 39–117)
BUN/Creatinine Ratio: 21 (ref 12–28)
BUN: 18 mg/dL (ref 8–27)
Bilirubin Total: <0.2 mg/dL (ref 0.0–1.2)
CO2: 23 mmol/L (ref 20–29)
Calcium: 9.3 mg/dL (ref 8.7–10.3)
Chloride: 101 mmol/L (ref 96–106)
Creatinine, Ser: 0.85 mg/dL (ref 0.57–1.00)
GFR calc Af Amer: 75 mL/min/{1.73_m2} (ref >59)
GFR calc non Af Amer: 65 mL/min/{1.73_m2} (ref >59)
Globulin, Total: 2.9 g/dL (ref 1.5–4.5)
Glucose: 93 mg/dL (ref 65–99)
Potassium: 4.4 mmol/L (ref 3.5–5.2)
Sodium: 137 mmol/L (ref 134–144)
Total Protein: 6.8 g/dL (ref 6.0–8.5)

## 2018-07-10 LAB — CBC WITH DIFFERENTIAL/PLATELET
Basophils Absolute: 0 10*3/uL (ref 0.0–0.2)
Basos: 1 %
EOS (ABSOLUTE): 0.2 10*3/uL (ref 0.0–0.4)
Eos: 4 %
Hematocrit: 30 % — ABNORMAL LOW (ref 34.0–46.6)
Hemoglobin: 9.6 g/dL — ABNORMAL LOW (ref 11.1–15.9)
Immature Grans (Abs): 0 10*3/uL (ref 0.0–0.1)
Immature Granulocytes: 0 %
Lymphocytes Absolute: 1.2 10*3/uL (ref 0.7–3.1)
Lymphs: 26 %
MCH: 26 pg — ABNORMAL LOW (ref 26.6–33.0)
MCHC: 32 g/dL (ref 31.5–35.7)
MCV: 81 fL (ref 79–97)
Monocytes Absolute: 0.4 10*3/uL (ref 0.1–0.9)
Monocytes: 10 %
Neutrophils Absolute: 2.7 10*3/uL (ref 1.4–7.0)
Neutrophils: 59 %
Platelets: 309 10*3/uL (ref 150–450)
RBC: 3.69 x10E6/uL — ABNORMAL LOW (ref 3.77–5.28)
RDW: 13.1 % (ref 12.3–15.4)
WBC: 4.5 10*3/uL (ref 3.4–10.8)

## 2018-07-10 NOTE — Telephone Encounter (Signed)
Faxed refill request for Meloxicam 7.5mg  denied. Patient did not want to continue this medication.

## 2018-07-18 ENCOUNTER — Telehealth: Payer: Self-pay

## 2018-07-18 ENCOUNTER — Ambulatory Visit: Payer: Medicare Other | Admitting: Orthotics

## 2018-07-18 DIAGNOSIS — M129 Arthropathy, unspecified: Secondary | ICD-10-CM

## 2018-07-18 DIAGNOSIS — M21062 Valgus deformity, not elsewhere classified, left knee: Secondary | ICD-10-CM

## 2018-07-18 DIAGNOSIS — M21072 Valgus deformity, not elsewhere classified, left ankle: Secondary | ICD-10-CM

## 2018-07-18 NOTE — Telephone Encounter (Signed)
Called pt to advise [facility] has been trying to reach her to make an appt. Pt has declined to go and referral will be closed. [place]

## 2018-07-18 NOTE — Progress Notes (Signed)
Picked up adjusted brace (change from lacing tp velcro); also added 4* varus RF post.

## 2018-07-25 ENCOUNTER — Encounter (INDEPENDENT_AMBULATORY_CARE_PROVIDER_SITE_OTHER): Payer: Self-pay | Admitting: Orthopaedic Surgery

## 2018-07-25 ENCOUNTER — Ambulatory Visit (INDEPENDENT_AMBULATORY_CARE_PROVIDER_SITE_OTHER): Payer: Medicare Other | Admitting: Orthopaedic Surgery

## 2018-07-25 ENCOUNTER — Ambulatory Visit (INDEPENDENT_AMBULATORY_CARE_PROVIDER_SITE_OTHER): Payer: Self-pay

## 2018-07-25 DIAGNOSIS — M1712 Unilateral primary osteoarthritis, left knee: Secondary | ICD-10-CM

## 2018-07-25 DIAGNOSIS — M21962 Unspecified acquired deformity of left lower leg: Secondary | ICD-10-CM | POA: Diagnosis not present

## 2018-07-25 DIAGNOSIS — G8929 Other chronic pain: Secondary | ICD-10-CM

## 2018-07-25 DIAGNOSIS — M25562 Pain in left knee: Secondary | ICD-10-CM | POA: Diagnosis not present

## 2018-07-25 DIAGNOSIS — M25572 Pain in left ankle and joints of left foot: Secondary | ICD-10-CM | POA: Diagnosis not present

## 2018-07-25 LAB — SPECIMEN STATUS REPORT

## 2018-07-25 LAB — B12 AND FOLATE PANEL
Folate: 12.3 ng/mL (ref >3.0)
Vitamin B-12: 285 pg/mL (ref 232–1245)

## 2018-07-25 LAB — IRON: Iron: 59 ug/dL (ref 27–139)

## 2018-07-25 MED ORDER — LIDOCAINE HCL 1 % IJ SOLN
3.0000 mL | INTRAMUSCULAR | Status: AC | PRN
  Administered 2018-07-25: 3 mL

## 2018-07-25 MED ORDER — METHYLPREDNISOLONE ACETATE 40 MG/ML IJ SUSP
40.0000 mg | INTRAMUSCULAR | Status: AC | PRN
  Administered 2018-07-25: 40 mg via INTRA_ARTICULAR

## 2018-07-25 NOTE — Progress Notes (Signed)
Office Visit Note   Patient: Kayla [name]           Date of Birth: Dec 25, 1940           MRN: [id number] Visit Date: 07/25/2018              Requested by: Denita Lung, MD [place], [place] [address] PCP: Denita Lung, MD   Assessment & Plan: Visit Diagnoses:  1. Chronic pain of left knee   2. Unilateral primary osteoarthritis, left knee   3. Left ankle joint deformity   4. Pain in left ankle and joints of left foot     Plan: This is certainly a tough situation for her.  She is a morbidly obese diabetic.  She does report good blood glucose control but I do not have a hemoglobin A1c.  She certainly has significant foot and ankle issues with malalignment of her ankle.  I do not have x-rays of her ankle today and cannot pull these up.  The plain films of her foot showed severe arthritic changes at the midfoot.  I would like to have my partner Dr. Sharol Given see her sometime in the next week or 2 to assess her left foot and ankle to help determine if any type of bracing is warranted versus a fusion or operative intervention.  At that visit she needs 3 views of her left ankle.  I did talk to her and her son in length about my comfort level with a total knee arthroplasty on her left knee.  Given her obesity combined with diabetes and a decent soft tissue envelope around her knee, I would not be comfortable performing that type of surgery on her.  Follow-Up Instructions: We will arrange follow-up with Dr. Sharol Given in 1 to 2 weeks Orders:  Orders Placed This Encounter  Procedures  . Large Joint Inj: L knee  . XR Knee 1-2 Views Left   No orders of the defined types were placed in this encounter.     Procedures: Large Joint Inj: L knee on 07/25/2018 4:16 PM Indications: diagnostic evaluation and pain Details: 22 G 1.5 in needle, superolateral approach  Arthrogram: No  Medications: 3 mL lidocaine 1 %; 40 mg methylPREDNISolone acetate 40 MG/ML Outcome: tolerated well, no  immediate complications Procedure, treatment alternatives, risks and benefits explained, specific risks discussed. Consent was given by the patient. Immediately prior to procedure a time out was called to verify the correct patient, procedure, equipment, support staff and site/side marked as required. Patient was prepped and draped in the usual sterile fashion.       Clinical Data: No additional findings.   Subjective: Chief Complaint  Patient presents with  . Left Knee - Pain  The patient is a 77 year old female who is morbidly obese with diabetes and sent to [place] for evaluation treatment of mainly her left knee pain.  She has a remote history of a right total knee arthroplasty done by Dr. Telford Nab in early 2000's.  She sees a podiatrist regularly for a left foot and ankle deformity.  She reports significant left ankle and left foot pain as well as left knee pain.  She does ambulate with a cane but is in a wheelchair today.  Her son is with her.  He has a lot of questions about her her in general as a relates to what treatment modalities can be provided for her knee and her foot and ankle.  Obviously her arthritis and pain is  detrimentally affected her mobility, her quality of life and her activities daily living.  Again she reports good blood glucose control is a diabetic but I do not have a recent hemoglobin A1c.  However it was below 7 a year ago.  There are x-rays on the canopy system of her left foot that was done recently.  HPI  Review of Systems She currently denies any headache, chest pain, shortness of breath, fever, chills, nausea, vomiting.  Objective: Vital Signs: There were no vitals taken for this visit.  Physical Exam She is alert oriented x3 and in no acute distress Ortho Exam Examination of her left knee shows slight varus malalignment.  There is patellofemoral capitation and no effusion but that is difficult to assess in terms of the obesity and soft tissue envelope around  her left knee.  I could feel the joint line.  Her left ankle seems to be aligned in a valgus type of position.  She has a flatfoot deformity and a bunion deformity.  She has a lot of midfoot pain.  Her foot is warm. Specialty Comments:  No specialty comments available.  Imaging: Xr Knee 1-2 Views Left  Result Date: 07/25/2018 2 views of the left knee show tricompartmental arthritic changes involving all 3 compartments.  There is severe joint space narrowing in all 3 compartments.  There are large particular osteophytes in all 3 compartments.  On the AP view a right total knee arthroplasty can be seen.    PMFS History: Patient Active Problem List   Diagnosis Date Noted  . Neck pain 07/09/2018  . Fatigue 07/09/2018  . Torticollis 07/05/2018  . Neck muscle spasm 07/05/2018  . Upper back pain 07/05/2018  . Impacted cerumen of left ear 07/05/2018  . Sinus pressure 07/05/2018  . Post-nasal drainage 07/05/2018  . Gastroesophageal reflux disease without esophagitis 04/16/2018  . Dependent edema 01/01/2017  . Anemia of chronic illness 02/15/2015  . Multiple falls 02/15/2015  . Diabetic neuropathy, type II diabetes mellitus ([facility]) 09/09/2012  . Hyperlipidemia associated with type 2 diabetes mellitus ([facility]) 05/22/2011  . Hypertension associated with diabetes ([facility]) 05/22/2011  . Obesity 12/14/2010  . Diabetes mellitus type 2 with complications ([place]) 85/46/2703  . History of pulmonary embolus ([facility]) 12/14/2010   Past Medical History:  Diagnosis Date  . Arthritis   . Diabetes mellitus   . Hypertension   . Obesity   . Pulmonary embolus (HCC)     Family History  Problem Relation Age of Onset  . Hypertension Mother     Past Surgical History:  Procedure Laterality Date  . ABDOMINAL HYSTERECTOMY    . CHOLECYSTECTOMY    . JOINT REPLACEMENT  Right   Social History   Occupational History  . Not on file  Tobacco Use  . Smoking status: Never Smoker  . Smokeless tobacco: Never Used    Substance and Sexual Activity  . Alcohol use: No  . Drug use: No  . Sexual activity: Not Currently

## 2018-07-30 ENCOUNTER — Ambulatory Visit: Payer: Medicare Other | Admitting: Orthotics

## 2018-07-30 DIAGNOSIS — M129 Arthropathy, unspecified: Secondary | ICD-10-CM

## 2018-07-30 DIAGNOSIS — M21062 Valgus deformity, not elsewhere classified, left knee: Secondary | ICD-10-CM

## 2018-07-30 DIAGNOSIS — L84 Corns and callosities: Secondary | ICD-10-CM

## 2018-07-30 NOTE — Progress Notes (Signed)
Added 1/8" lift to contralateral side to try tomake brace more even.

## 2018-08-05 ENCOUNTER — Ambulatory Visit (INDEPENDENT_AMBULATORY_CARE_PROVIDER_SITE_OTHER): Payer: Medicare Other | Admitting: Orthopedic Surgery

## 2018-08-05 ENCOUNTER — Encounter (INDEPENDENT_AMBULATORY_CARE_PROVIDER_SITE_OTHER): Payer: Self-pay | Admitting: Orthopedic Surgery

## 2018-08-05 ENCOUNTER — Ambulatory Visit (INDEPENDENT_AMBULATORY_CARE_PROVIDER_SITE_OTHER): Payer: Self-pay

## 2018-08-05 VITALS — Ht 64.0 in | Wt 235.0 lb

## 2018-08-05 DIAGNOSIS — E1142 Type 2 diabetes mellitus with diabetic polyneuropathy: Secondary | ICD-10-CM | POA: Diagnosis not present

## 2018-08-05 DIAGNOSIS — M1712 Unilateral primary osteoarthritis, left knee: Secondary | ICD-10-CM

## 2018-08-05 DIAGNOSIS — M25572 Pain in left ankle and joints of left foot: Secondary | ICD-10-CM | POA: Diagnosis not present

## 2018-08-05 DIAGNOSIS — Z6841 Body Mass Index (BMI) 40.0 and over, adult: Secondary | ICD-10-CM | POA: Diagnosis not present

## 2018-08-05 NOTE — Progress Notes (Signed)
Office Visit Note   Patient: Kayla [name]           Date of Birth: 01-03-1941           MRN: [id number] Visit Date: 08/05/2018              Requested by: Denita Lung, MD [address] [place], [place] [address] PCP: Denita Lung, MD  Chief Complaint  Patient presents with  . Left Foot - Pain  . Left Ankle - Pain      HPI: Patient is a 78 year old woman who presents for second opinion regarding osteoarthritis of her left knee.  Patient was initially seen by Dr. Ninfa Linden and discussed with her increased risks associated with surgery.  Patient also has been having some chronic pain in her left ankle she states she currently has a brace from her podiatrist and she states the brace does help.  Patient is diabetic she has a BMI greater than 40 and patient does have a history of a pulmonary embolus in 2012.  Assessment & Plan: Visit Diagnoses:  1. Pain in left ankle and joints of left foot   2. Unilateral primary osteoarthritis, left knee     Plan: Discussed with the patient as well as her son over the phone that surgery is an option but she is at increased risk of DVT infection potential for amputation.  Patient and her son state they are not willing to take the risk.  Recommended exercising for strength and conditioning.  Patient states she does have the Silver sneakers at [facility] and recommended following up with a trainer there.  Also discussed that she could use physical therapy on a temporary basis to give her the right exercises.  Discussed using anti-inflammatories as well as Tylenol for her symptoms.  Patient states she has had GI upset with Naprosyn in the past.  Follow-Up Instructions: Return if symptoms worsen or fail to improve.   Ortho Exam  Patient is alert, oriented, no adenopathy, well-dressed, normal affect, normal respiratory effort. Examination patient is ambulating a wheelchair she states she normally uses a cane.  Patient does have valgus  alignment to the left knee.  There is crepitation with range of motion she has global tenderness to palpation with varus and valgus stress she does have increased laxity of the MCL.  She lacks about 10 degrees to full extension and has flexion to 90 degrees.  Radiographs shows tricompartmental arthritic changes of the left knee status post revision total knee arthroplasty on the right knee.  Imaging: No results found. No images are attached to the encounter.  Labs: Lab Results  Component Value Date   HGBA1C 6.7 (A) 04/16/2018   HGBA1C 6.5 (A) 01/28/2018   HGBA1C 6.5 01/01/2017   ESRSEDRATE 60 (H) 11/25/2016     Lab Results  Component Value Date   ALBUMIN 3.9 07/09/2018   ALBUMIN 3.9 03/06/2016   ALBUMIN 3.7 01/04/2015    Body mass index is 40.34 kg/m.  Orders:  No orders of the defined types were placed in this encounter.  No orders of the defined types were placed in this encounter.    Procedures: No procedures performed  Clinical Data: No additional findings.  ROS:  All other systems negative, except as noted in the HPI. Review of Systems  Objective: Vital Signs: Ht 5\' 4"  (5.631 m)   Wt 235 lb (106.6 kg)   BMI 40.34 kg/m   Specialty Comments:  No specialty comments available.  PMFS History: Patient Active Problem List   Diagnosis Date Noted  . Neck pain 07/09/2018  . Fatigue 07/09/2018  . Torticollis 07/05/2018  . Neck muscle spasm 07/05/2018  . Upper back pain 07/05/2018  . Impacted cerumen of left ear 07/05/2018  . Sinus pressure 07/05/2018  . Post-nasal drainage 07/05/2018  . Gastroesophageal reflux disease without esophagitis 04/16/2018  . Dependent edema 01/01/2017  . Anemia of chronic illness 02/15/2015  . Multiple falls 02/15/2015  . Diabetic neuropathy, type II diabetes mellitus ([place]) 09/09/2012  . Hyperlipidemia associated with type 2 diabetes mellitus ([place]) 05/22/2011  . Hypertension associated with diabetes ([place]) 05/22/2011  . Obesity  12/14/2010  . Diabetes mellitus type 2 with complications ([place]) 06/16/3566  . History of pulmonary embolus (PE) 12/14/2010   Past Medical History:  Diagnosis Date  . Arthritis   . Diabetes mellitus   . Hypertension   . Obesity   . Pulmonary embolus (HCC)     Family History  Problem Relation Age of Onset  . Hypertension Mother     Past Surgical History:  Procedure Laterality Date  . ABDOMINAL HYSTERECTOMY    . CHOLECYSTECTOMY    . JOINT REPLACEMENT  Right   Social History   Occupational History  . Not on file  Tobacco Use  . Smoking status: Never Smoker  . Smokeless tobacco: Never Used  Substance and Sexual Activity  . Alcohol use: No  . Drug use: No  . Sexual activity: Not Currently

## 2018-08-07 ENCOUNTER — Ambulatory Visit: Payer: Medicare Other | Admitting: Podiatry

## 2018-08-07 ENCOUNTER — Encounter: Payer: Self-pay | Admitting: Podiatry

## 2018-08-07 DIAGNOSIS — B351 Tinea unguium: Secondary | ICD-10-CM

## 2018-08-07 DIAGNOSIS — E1142 Type 2 diabetes mellitus with diabetic polyneuropathy: Secondary | ICD-10-CM | POA: Diagnosis not present

## 2018-08-07 DIAGNOSIS — M79676 Pain in unspecified toe(s): Secondary | ICD-10-CM

## 2018-08-07 DIAGNOSIS — L84 Corns and callosities: Secondary | ICD-10-CM | POA: Diagnosis not present

## 2018-08-07 NOTE — Patient Instructions (Signed)
Diabetes Mellitus and Foot Care  Foot care is an important part of your health, especially when you have diabetes. Diabetes may cause you to have problems because of poor blood flow (circulation) to your feet and legs, which can cause your skin to:   Become thinner and drier.   Break more easily.   Heal more slowly.   Peel and crack.  You may also have nerve damage (neuropathy) in your legs and feet, causing decreased feeling in them. This means that you may not notice minor injuries to your feet that could lead to more serious problems. Noticing and addressing any potential problems early is the best way to prevent future foot problems.  How to care for your feet  Foot hygiene   Wash your feet daily with warm water and mild soap. Do not use hot water. Then, pat your feet and the areas between your toes until they are completely dry. Do not soak your feet as this can dry your skin.   Trim your toenails straight across. Do not dig under them or around the cuticle. File the edges of your nails with an emery board or nail file.   Apply a moisturizing lotion or petroleum jelly to the skin on your feet and to dry, brittle toenails. Use lotion that does not contain alcohol and is unscented. Do not apply lotion between your toes.  Shoes and socks   Wear clean socks or stockings every day. Make sure they are not too tight. Do not wear knee-high stockings since they may decrease blood flow to your legs.   Wear shoes that fit properly and have enough cushioning. Always look in your shoes before you put them on to be sure there are no objects inside.   To break in new shoes, wear them for just a few hours a day. This prevents injuries on your feet.  Wounds, scrapes, corns, and calluses   Check your feet daily for blisters, cuts, bruises, sores, and redness. If you cannot see the bottom of your feet, use a mirror or ask someone for help.   Do not cut corns or calluses or try to remove them with medicine.   If you  find a minor scrape, cut, or break in the skin on your feet, keep it and the skin around it clean and dry. You may clean these areas with mild soap and water. Do not clean the area with peroxide, alcohol, or iodine.   If you have a wound, scrape, corn, or callus on your foot, look at it several times a day to make sure it is healing and not infected. Check for:  ? Redness, swelling, or pain.  ? Fluid or blood.  ? Warmth.  ? Pus or a bad smell.  General instructions   Do not cross your legs. This may decrease blood flow to your feet.   Do not use heating pads or hot water bottles on your feet. They may burn your skin. If you have lost feeling in your feet or legs, you may not know this is happening until it is too late.   Protect your feet from hot and cold by wearing shoes, such as at the beach or on hot pavement.   Schedule a complete foot exam at least once a year (annually) or more often if you have foot problems. If you have foot problems, report any cuts, sores, or bruises to your health care provider immediately.  Contact a health care provider if:     You have a medical condition that increases your risk of infection and you have any cuts, sores, or bruises on your feet.   You have an injury that is not healing.   You have redness on your legs or feet.   You feel burning or tingling in your legs or feet.   You have pain or cramps in your legs and feet.   Your legs or feet are numb.   Your feet always feel cold.   You have pain around a toenail.  Get help right away if:   You have a wound, scrape, corn, or callus on your foot and:  ? You have pain, swelling, or redness that gets worse.  ? You have fluid or blood coming from the wound, scrape, corn, or callus.  ? Your wound, scrape, corn, or callus feels warm to the touch.  ? You have pus or a bad smell coming from the wound, scrape, corn, or callus.  ? You have a fever.  ? You have a red line going up your leg.  Summary   Check your feet every day  for cuts, sores, red spots, swelling, and blisters.   Moisturize feet and legs daily.   Wear shoes that fit properly and have enough cushioning.   If you have foot problems, report any cuts, sores, or bruises to your health care provider immediately.   Schedule a complete foot exam at least once a year (annually) or more often if you have foot problems.  This information is not intended to replace advice given to you by your health care provider. Make sure you discuss any questions you have with your health care provider.  Document Released: 07/14/2000 Document Revised: 08/29/2017 Document Reviewed: 08/18/2016  Elsevier Interactive Patient Education  2019 [employer]

## 2018-08-07 NOTE — Progress Notes (Signed)
Subjective: Kayla [name] is seen today for preventative foot care.  She has known history of diabetes, diabetic neuropathy and cc of painful, discolored, thick toenails and painful heel calluses which interfere with activities of daily living.  Both conditions respond to periodic professional debridement.  [name] states her heels are responding well with the triamcinolone ointment. She believes she has one refill left.  Objective:  Vascular Examination: Capillary refill time immediate to all 10 digits Dorsalis pedis and Posterior tibial pulses present b/l Pedal hair growth normal Skin temperature gradient within normal limits bilaterally Bilateral ankle edema  Dermatological Examination: Skin with normal turgor, texture and tone b/l  Chronic venous stasis skin changes noted b/l  No open wounds noted.  No interdigital macerations noted.  Toenails 1-5 b/l discolored, thick, dystrophic with subungual debris and pain with palpation to nailbeds due to thickness of nails.  Hyperkeratotic lesions noted plantar central heel as well as circumference of both heels bilaterally. Improved in appearance from previous visit.  Musculoskeletal: Muscle strength 5/5 to all LE muscle groups  Neurological: Sensation intact with 10 gram monofilament.  Vibratory sensation intact  Assessment: 1. Painful onychomycosis toenails 1-5 b/l 2. Calluses b/l heels 3. NIDDM with Diabetic neuropathy  Plan: 1. Continue diabetic foot care principles.  2. Toenails 1-5 b/l were debrided in length and girth without iatrogenic bleeding. 3. Hyperkeratotic lesions pared with sterile chisel blade bilateral heels.  Continue triamcinolone ointment 0.005%.  Ointment is to be applied to rough skin on heels twice daily. 4. Patient to continue soft, supportive shoe gear 5. Patient to report any pedal injuries to medical professional  6. Follow up 3 months. Patient/POA to call should there be a concern in the  interim.

## 2018-08-15 ENCOUNTER — Ambulatory Visit: Payer: Medicare Other | Admitting: Podiatry

## 2018-08-19 ENCOUNTER — Ambulatory Visit: Payer: Medicare Other | Admitting: Family Medicine

## 2018-08-19 ENCOUNTER — Encounter: Payer: Self-pay | Admitting: Family Medicine

## 2018-08-19 VITALS — BP 132/86 | HR 60 | Temp 97.9°F | Wt 236.8 lb

## 2018-08-19 DIAGNOSIS — E1169 Type 2 diabetes mellitus with other specified complication: Secondary | ICD-10-CM | POA: Diagnosis not present

## 2018-08-19 DIAGNOSIS — I152 Hypertension secondary to endocrine disorders: Secondary | ICD-10-CM

## 2018-08-19 DIAGNOSIS — D638 Anemia in other chronic diseases classified elsewhere: Secondary | ICD-10-CM

## 2018-08-19 DIAGNOSIS — Z6841 Body Mass Index (BMI) 40.0 and over, adult: Secondary | ICD-10-CM

## 2018-08-19 DIAGNOSIS — I10 Essential (primary) hypertension: Secondary | ICD-10-CM

## 2018-08-19 DIAGNOSIS — E118 Type 2 diabetes mellitus with unspecified complications: Secondary | ICD-10-CM

## 2018-08-19 DIAGNOSIS — E785 Hyperlipidemia, unspecified: Secondary | ICD-10-CM

## 2018-08-19 DIAGNOSIS — M542 Cervicalgia: Secondary | ICD-10-CM

## 2018-08-19 DIAGNOSIS — E1159 Type 2 diabetes mellitus with other circulatory complications: Secondary | ICD-10-CM

## 2018-08-19 NOTE — Addendum Note (Signed)
Addended by: Elyse Jarvis on: 08/19/2018 02:16 PM   Modules accepted: Orders

## 2018-08-19 NOTE — Progress Notes (Signed)
  Subjective:    Patient ID: Kayla [name], female    DOB: [birth year], 78 y.o.   MRN: [id number]  Kayla [name] is a 78 y.o. female who presents for follow-up of Type 2 diabetes mellitus.  Patient is checking home blood sugars.   Home blood sugar records: she write them down How often is blood sugars being checked: QD fasting 120-140 Current symptoms/problems include none at this time. Daily foot checks: yes   Any foot concerns: dryness podiatry handles Last eye exam: 01-23-2018 Exercise: sliver sneakers.  She sees orthopedics and podiatry.  They both recommended physical activity.  She is taking metformin and having no difficulty with that.  She has not taking meloxicam at present time.  Continues on Coreg and losartan/HCTZ.  She is not taking the pantoprazole.  Continues on atorvastatin.  She does complain of neck pain but so far not gotten the x-ray.  The following portions of the patient's history were reviewed and updated as appropriate: allergies, current medications, past medical history, past social history and problem list.  ROS as in subjective above.     Objective:    Physical Exam Alert and in no distress.  Foot exam difficult to do due to brace on the left foot.  Right foot exam does show questionable decreased sensation but good pulses.  Nail deformity is noted.  Lab Review Diabetic Labs Latest Ref Rng & Units 07/09/2018 04/16/2018 01/28/2018 01/01/2017 11/25/2016  HbA1c 4.0 - 5.6 % - 6.7(A) 6.5(A) 6.5 -  Chol 125 - 200 mg/dL - - - - -  HDL >=46 mg/dL - - - - -  Calc LDL <130 mg/dL - - - - -  Triglycerides <150 mg/dL - - - - -  Creatinine 0.57 - 1.00 mg/dL 0.85 - - - 0.80   BP/Weight 08/05/2018 07/09/2018 07/05/2018 07/02/2018 8/88/2800  Systolic BP - 349 179 150 569  Diastolic BP - 80 74 74 88  Wt. (Lbs) 235 235.6 - - 237.8  BMI 40.34 40.44 - - 40.82   Foot/eye exam completion dates Latest Ref Rng & Units 01/23/2018 12/06/2016  Eye Exam No Retinopathy No Retinopathy No  Retinopathy  Foot Form Completion - - -  A1c is 6.7  Kayla [name]  reports that she has never smoked. She has never used smokeless tobacco. She reports that she does not drink alcohol or use drugs.     Assessment & Plan:    Neck pain  Diabetes mellitus type 2 with complications (HCC)  Anemia of chronic illness  Hyperlipidemia associated with type 2 diabetes mellitus ([place])  Hypertension associated with diabetes ([place])  Class 3 severe obesity due to excess calories without serious comorbidity with body mass index (BMI) of 45.0 to 49.9 in adult ([place])    1. Rx changes: none 2. Education: Reviewed 'ABCs' of diabetes management (respective goals in parentheses):  A1C (<7), blood pressure (<130/80), and cholesterol (LDL <100). 3. Compliance at present is estimated to be good. Efforts to improve compliance (if necessary) will be directed at increased exercise.  Recommend she use water aerobics to go with easier on her joints.  She plans to do this. 4. Follow up: 4 months

## 2018-08-30 ENCOUNTER — Other Ambulatory Visit: Payer: Self-pay | Admitting: Family Medicine

## 2018-08-30 DIAGNOSIS — E1140 Type 2 diabetes mellitus with diabetic neuropathy, unspecified: Secondary | ICD-10-CM

## 2018-09-04 ENCOUNTER — Other Ambulatory Visit: Payer: Self-pay | Admitting: Family Medicine

## 2018-09-04 DIAGNOSIS — I152 Hypertension secondary to endocrine disorders: Secondary | ICD-10-CM

## 2018-09-04 DIAGNOSIS — I10 Essential (primary) hypertension: Principal | ICD-10-CM

## 2018-09-04 DIAGNOSIS — E1159 Type 2 diabetes mellitus with other circulatory complications: Secondary | ICD-10-CM

## 2018-10-04 ENCOUNTER — Other Ambulatory Visit: Payer: Medicare Other | Admitting: Orthotics

## 2018-10-10 ENCOUNTER — Ambulatory Visit: Payer: Medicare Other | Admitting: Orthotics

## 2018-10-10 ENCOUNTER — Other Ambulatory Visit: Payer: Self-pay

## 2018-10-10 DIAGNOSIS — L84 Corns and callosities: Secondary | ICD-10-CM

## 2018-10-10 DIAGNOSIS — E1142 Type 2 diabetes mellitus with diabetic polyneuropathy: Secondary | ICD-10-CM

## 2018-10-10 DIAGNOSIS — M129 Arthropathy, unspecified: Secondary | ICD-10-CM

## 2018-10-10 NOTE — Progress Notes (Signed)
Patient came to p/up shoe for brace.

## 2018-10-21 ENCOUNTER — Encounter (INDEPENDENT_AMBULATORY_CARE_PROVIDER_SITE_OTHER): Payer: Self-pay | Admitting: Orthopedic Surgery

## 2018-10-21 ENCOUNTER — Ambulatory Visit (INDEPENDENT_AMBULATORY_CARE_PROVIDER_SITE_OTHER): Payer: Medicare Other | Admitting: Orthopedic Surgery

## 2018-10-21 ENCOUNTER — Other Ambulatory Visit: Payer: Self-pay

## 2018-10-21 VITALS — Ht 64.0 in | Wt 236.0 lb

## 2018-10-21 DIAGNOSIS — M25572 Pain in left ankle and joints of left foot: Secondary | ICD-10-CM | POA: Diagnosis not present

## 2018-10-21 DIAGNOSIS — E1159 Type 2 diabetes mellitus with other circulatory complications: Secondary | ICD-10-CM

## 2018-10-21 DIAGNOSIS — M1712 Unilateral primary osteoarthritis, left knee: Secondary | ICD-10-CM

## 2018-10-21 DIAGNOSIS — I10 Essential (primary) hypertension: Secondary | ICD-10-CM

## 2018-10-21 DIAGNOSIS — E1165 Type 2 diabetes mellitus with hyperglycemia: Secondary | ICD-10-CM

## 2018-10-21 DIAGNOSIS — E785 Hyperlipidemia, unspecified: Secondary | ICD-10-CM

## 2018-10-21 DIAGNOSIS — E1169 Type 2 diabetes mellitus with other specified complication: Secondary | ICD-10-CM

## 2018-10-21 DIAGNOSIS — E669 Obesity, unspecified: Secondary | ICD-10-CM

## 2018-10-21 DIAGNOSIS — Z6841 Body Mass Index (BMI) 40.0 and over, adult: Secondary | ICD-10-CM

## 2018-10-21 MED ORDER — LIDOCAINE HCL 1 % IJ SOLN
5.0000 mL | INTRAMUSCULAR | Status: AC | PRN
  Administered 2018-10-21: 5 mL

## 2018-10-21 MED ORDER — METHYLPREDNISOLONE ACETATE 40 MG/ML IJ SUSP
40.0000 mg | INTRAMUSCULAR | Status: AC | PRN
  Administered 2018-10-21: 40 mg via INTRA_ARTICULAR

## 2018-10-21 NOTE — Progress Notes (Signed)
Office Visit Note   Patient: Kayla [name]           Date of Birth: 27-Dec-1940           MRN: [id number] Visit Date: 10/21/2018              Requested by: Denita Lung, MD [address] [place], [place] [address] PCP: Denita Lung, MD  Chief Complaint  Patient presents with  . Left Foot - Pain      HPI: Patient is a 78 year old woman who presents with painful osteoarthritis of her left knee type 2 diabetes currently poorly controlled with arthritic pain in her left foot as well.  Patient states she does have a brace but has not been wearing it because it is uncomfortable.  She does have soft shoes that she is obtained from her podiatrist.  Assessment & Plan: Visit Diagnoses:  1. Pain in left ankle and joints of left foot   2. Unilateral primary osteoarthritis, left knee     Plan: Recommended patient wear the brace for the left shoe to unload pressure through the midfoot.  Left knee was injected discussed that this may elevate her blood sugars temporarily but she should check this.  Reevaluate in 4 weeks she is to bring her brace with her so we can evaluate if any modifications need to be performed on the brace.  Follow-Up Instructions: Return in about 4 weeks (around 11/18/2018).   Ortho Exam  Patient is alert, oriented, no adenopathy, well-dressed, normal affect, normal respiratory effort. Examination patient ambulates in a wheelchair.  She cannot get from a sitting to a standing position she cannot manually flex her knee to 90 degrees.  She does have global pain to palpation around the left knee there is no redness no cellulitis no effusion.  Examination the left foot she has a good pulse she has pain to palpation across the midfoot.  Her radiographs were reviewed which show extensive arthritis through the midfoot including the talonavicular and the navicular cuneiform joints as well as osteophytic bone spurs around the calcaneus.    Imaging: No results found. No  images are attached to the encounter.  Labs: Lab Results  Component Value Date   HGBA1C 6.7 (A) 04/16/2018   HGBA1C 6.5 (A) 01/28/2018   HGBA1C 6.5 01/01/2017   ESRSEDRATE 60 (H) 11/25/2016     Lab Results  Component Value Date   ALBUMIN 3.9 07/09/2018   ALBUMIN 3.9 03/06/2016   ALBUMIN 3.7 01/04/2015    Body mass index is 40.51 kg/m.  Orders:  No orders of the defined types were placed in this encounter.  No orders of the defined types were placed in this encounter.    Procedures: Large Joint Inj: L knee on 10/21/2018 10:56 AM Indications: pain and diagnostic evaluation Details: 22 G 1.5 in needle, anteromedial approach  Arthrogram: No  Medications: 5 mL lidocaine 1 %; 40 mg methylPREDNISolone acetate 40 MG/ML Outcome: tolerated well, no immediate complications Procedure, treatment alternatives, risks and benefits explained, specific risks discussed. Consent was given by the patient. Immediately prior to procedure a time out was called to verify the correct patient, procedure, equipment, support staff and site/side marked as required. Patient was prepped and draped in the usual sterile fashion.      Clinical Data: No additional findings.  ROS:  All other systems negative, except as noted in the HPI. Review of Systems  Objective: Vital Signs: Ht 5\' 4"  (1.626 m)   Wt  236 lb (107 kg)   BMI 40.51 kg/m   Specialty Comments:  No specialty comments available.  PMFS History: Patient Active Problem List   Diagnosis Date Noted  . Neck pain 07/09/2018  . Fatigue 07/09/2018  . Torticollis 07/05/2018  . Neck muscle spasm 07/05/2018  . Upper back pain 07/05/2018  . Gastroesophageal reflux disease without esophagitis 04/16/2018  . Dependent edema 01/01/2017  . Anemia of chronic illness 02/15/2015  . Multiple falls 02/15/2015  . Diabetic neuropathy, type II diabetes mellitus ([place]) 09/09/2012  . Hyperlipidemia associated with type 2 diabetes mellitus ([place])  05/22/2011  . Hypertension associated with diabetes ([place]) 05/22/2011  . Obesity 12/14/2010  . Diabetes mellitus type 2 with complications ([place]) 88/32/5498  . History of pulmonary embolus (PE) 12/14/2010   Past Medical History:  Diagnosis Date  . Arthritis   . Diabetes mellitus   . Hypertension   . Obesity   . Pulmonary embolus (HCC)     Family History  Problem Relation Age of Onset  . Hypertension Mother     Past Surgical History:  Procedure Laterality Date  . ABDOMINAL HYSTERECTOMY    . CHOLECYSTECTOMY    . JOINT REPLACEMENT  Right   Social History   Occupational History  . Not on file  Tobacco Use  . Smoking status: Never Smoker  . Smokeless tobacco: Never Used  Substance and Sexual Activity  . Alcohol use: No  . Drug use: No  . Sexual activity: Not Currently

## 2018-10-28 ENCOUNTER — Telehealth (INDEPENDENT_AMBULATORY_CARE_PROVIDER_SITE_OTHER): Payer: Self-pay | Admitting: Radiology

## 2018-10-28 NOTE — Telephone Encounter (Signed)
FYI... Patient called requesting to speak with Dr. Sharol Given. She was seen in the office on 10/21/2018 and received an injection in her knee. She states that the injection only lasted two days, however, her foot seems to be bothering her considerably more. I asked if she was wearing the brace in her shoe that Dr. Sharol Given recommended at the visit, and she states that she has probably not been wearing it enough. I explained that he wanted her to try the brace and see if that would help her foot. She is going to try and wear the brace as directed. She will call back if she has continued problems.

## 2018-11-06 ENCOUNTER — Other Ambulatory Visit: Payer: Self-pay

## 2018-11-06 ENCOUNTER — Encounter: Payer: Self-pay | Admitting: Podiatry

## 2018-11-06 ENCOUNTER — Ambulatory Visit: Payer: Medicare Other | Admitting: Podiatry

## 2018-11-06 VITALS — Temp 97.5°F

## 2018-11-06 DIAGNOSIS — E1142 Type 2 diabetes mellitus with diabetic polyneuropathy: Secondary | ICD-10-CM

## 2018-11-06 DIAGNOSIS — M79676 Pain in unspecified toe(s): Secondary | ICD-10-CM | POA: Diagnosis not present

## 2018-11-06 DIAGNOSIS — L859 Epidermal thickening, unspecified: Secondary | ICD-10-CM | POA: Diagnosis not present

## 2018-11-06 DIAGNOSIS — L84 Corns and callosities: Secondary | ICD-10-CM

## 2018-11-06 DIAGNOSIS — B351 Tinea unguium: Secondary | ICD-10-CM

## 2018-11-07 ENCOUNTER — Encounter: Payer: Self-pay | Admitting: Podiatry

## 2018-11-07 NOTE — Progress Notes (Signed)
Subjective: Kayla [name] presents with diabetes, diabetic neuropathy for diabetic foot care. She has painful heel calluses b/l.  She relates the ointment prescribed on her last visit appears to be working.   She has seen ortho for her ankle pain and was told she has arthritis.  She voices no other pedal complaints on today's visit.  She has subjective signs of neuropathy.   Denita Lung, MD is her PCP and last visit was 08/19/2018.   Current Outpatient Medications:  .  amoxicillin (AMOXIL) 875 MG tablet, Take 1 tablet (875 mg total) by mouth 2 (two) times daily., Disp: 20 tablet, Rfl: 0 .  aspirin ([facility] ASPIRIN EC) 325 MG EC tablet, USE PER THE INSTRUCTIONS ON PACKAGE LABEL, Disp: 300 tablet, Rfl: 1 .  atorvastatin (LIPITOR) 80 MG tablet, Take 1 tablet by mouth  daily at 6PM, Disp: , Rfl:  .  carvedilol (COREG) 3.125 MG tablet, TAKE 1 TABLET BY MOUTH TWICE A DAY, Disp: 180 tablet, Rfl: 3 .  diclofenac sodium (VOLTAREN) 1 % GEL, Apply 2 g topically 4 (four) times daily., Disp: , Rfl:  .  glucose blood (ONE TOUCH ULTRA TEST) test strip, TEST ONCE A DAY (THIS IS FOR ONE TOUCH ULTRA ), Disp: 100 each, Rfl: 2 .  IRON, FERROUS GLUCONATE, PO, Take by mouth., Disp: , Rfl:  .  losartan-hydrochlorothiazide (HYZAAR) 50-12.5 MG tablet, TAKE 1 TABLET BY MOUTH DAILY., Disp: 90 tablet, Rfl: 0 .  meloxicam (MOBIC) 7.5 MG tablet, TAKE 1 TABLET BY MOUTH EVERY DAY, Disp: 30 tablet, Rfl: 0 .  metFORMIN (GLUCOPHAGE) 500 MG tablet, TAKE 1 TABLET BY MOUTH TWO TIMES DAILY WITH MEALS, Disp: 180 tablet, Rfl: 1 .  methocarbamol (ROBAXIN) 500 MG tablet, Take 1 tablet (500 mg total) by mouth 2 (two) times daily., Disp: 20 tablet, Rfl: 0 .  Multiple Vitamins-Minerals (MULTIVITAMIN WITH MINERALS) tablet, Take 1 tablet by mouth daily., Disp: , Rfl:  .  pantoprazole (PROTONIX) 20 MG tablet, TAKE 1 TABLET BY MOUTH EVERY DAY, Disp: 90 tablet, Rfl: 0 .  TRIAMCINOLONE ACETONIDE, TOP, 0.05 % OINT, Apply to rough skin on  heels twice daily, Disp: 430 g, Rfl: 1  No Known Allergies  Vascular Examination: Capillary refill time immediate x 10 digits.  Dorsalis pedis pulses present b/l.  Posterior tibial pulses present b/l.  Dgital hair sparse x 10 digits.  Skin temperature gradient WNL b/l.  Dermatological Examination: Skin with normal turgor, texture and tone b/l.  Toenails 1-5 b/l discolored, thick, dystrophic with subungual debris and pain with palpation to nailbeds due to thickness of nails.  Hyperkeratotic lesion b/l heels. No erythema, no edema, no drainage, no flocculence noted.   Musculoskeletal: Muscle strength 5/5 to all LE muscle groups  Severe pes planus with left ankle valgus  Left knee valgus deformity noted  Neurological: Sensation intact with 10 gram monofilament.  Assessment: 1. Painful onychomycosis toenails 1-5 b/l 2. Calluses b/l heels 3. NIDDM with Diabetic neuropathy  Plan: 1. Continue diabetic foot care principles. Literature dispensed on today. 2. Toenails 1-5 b/l were debrided in length and girth without iatrogenic bleeding. 3. Calluses pared b/l heels utilizing sterile scalpel blade without incident.  4. Continue AFO brace LLE. 5. Patient to continue soft, supportive shoe gear daily. 6. Patient to report any pedal injuries to medical professional  7. Follow up 3 months.  8. Patient/POA to call should there be a concern in the interim.

## 2018-11-12 ENCOUNTER — Telehealth: Payer: Self-pay | Admitting: Family Medicine

## 2018-11-12 NOTE — Telephone Encounter (Signed)
Pt called and is wanting to know if she can take Turmeric Curcumin 500mg  with 60 mg Ginger Powder, she wanted to make sure since she is a diabetic pt can be reached at [phone]

## 2018-11-12 NOTE — Telephone Encounter (Signed)
lvm for pt of [place]. [place]

## 2018-11-12 NOTE — Telephone Encounter (Signed)
ok 

## 2018-11-18 ENCOUNTER — Ambulatory Visit (INDEPENDENT_AMBULATORY_CARE_PROVIDER_SITE_OTHER): Payer: Medicare Other | Admitting: Orthopedic Surgery

## 2018-11-18 ENCOUNTER — Encounter (INDEPENDENT_AMBULATORY_CARE_PROVIDER_SITE_OTHER): Payer: Self-pay | Admitting: Orthopedic Surgery

## 2018-11-18 ENCOUNTER — Other Ambulatory Visit: Payer: Self-pay

## 2018-11-18 VITALS — Ht 64.0 in | Wt 236.0 lb

## 2018-11-18 DIAGNOSIS — E1142 Type 2 diabetes mellitus with diabetic polyneuropathy: Secondary | ICD-10-CM | POA: Diagnosis not present

## 2018-11-18 DIAGNOSIS — M1712 Unilateral primary osteoarthritis, left knee: Secondary | ICD-10-CM

## 2018-11-18 DIAGNOSIS — M25572 Pain in left ankle and joints of left foot: Secondary | ICD-10-CM | POA: Diagnosis not present

## 2018-11-18 DIAGNOSIS — Z6841 Body Mass Index (BMI) 40.0 and over, adult: Secondary | ICD-10-CM | POA: Diagnosis not present

## 2018-11-18 NOTE — Progress Notes (Signed)
Office Visit Note   Patient: Kayla [name]           Date of Birth: 03-24-1941           MRN: [id number] Visit Date: 11/18/2018              Requested by: Denita Lung, MD [address] [place], [place] [address] PCP: Denita Lung, MD  Chief Complaint  Patient presents with  . Left Ankle - Follow-up  . Left Knee - Follow-up      HPI: Patient is a 78 year old woman who presents in follow-up for osteoarthritis of her left knee and left midfoot.  States that the steroid injection in the left knee did not help.  She states that the arthritis in her left foot does feel better with her [place] brace but she states she cannot put it on herself and she lives alone.  Assessment & Plan: Visit Diagnoses:  1. Pain in left ankle and joints of left foot   2. Unilateral primary osteoarthritis, left knee   3. Body mass index 40.0-44.9, adult ([place])   4. Diabetic polyneuropathy associated with type 2 diabetes mellitus ([place])   5. Morbid obesity ([place])     Plan: Recommended exercise for strengthening of the muscles around the left knee to see if this will help unload pressure from the articular surface recommended the importance of weight loss.  Discussed that with her elevated BMI that it would not be safe to proceed with surgery recommended weight loss to get her BMI below 40.  Discussed that if she cannot wear the [place] brace a option would be for midfoot fusion.  Discussed that with her diabetes she is increased risk of infection and complications she states she is not interested in surgery on the foot or knee at this time.  Follow-Up Instructions: Return in about 4 weeks (around 12/16/2018).   Ortho Exam  Patient is alert, oriented, no adenopathy, well-dressed, normal affect, normal respiratory effort. Examination patient ambulates in a wheelchair she has a good dorsalis pedis pulse she has pain to palpation across the midfoot.  Examination of her knee she is tender to palpation  medial lateral joint line collaterals and cruciates are stable she is also tender to palpation the patellofemoral joint.  Her last hemoglobin A1c is under reasonable control with an A1c of 6.7.  Imaging: No results found. No images are attached to the encounter.  Labs: Lab Results  Component Value Date   HGBA1C 6.7 (A) 04/16/2018   HGBA1C 6.5 (A) 01/28/2018   HGBA1C 6.5 01/01/2017   ESRSEDRATE 60 (H) 11/25/2016     Lab Results  Component Value Date   ALBUMIN 3.9 07/09/2018   ALBUMIN 3.9 03/06/2016   ALBUMIN 3.7 01/04/2015    Body mass index is 40.51 kg/m.  Orders:  No orders of the defined types were placed in this encounter.  No orders of the defined types were placed in this encounter.    Procedures: No procedures performed  Clinical Data: No additional findings.  ROS:  All other systems negative, except as noted in the HPI. Review of Systems  Objective: Vital Signs: Ht 5\' 4"  (1.626 m)   Wt 236 lb (107 kg)   BMI 40.51 kg/m   Specialty Comments:  No specialty comments available.  PMFS History: Patient Active Problem List   Diagnosis Date Noted  . Neck pain 07/09/2018  . Fatigue 07/09/2018  . Torticollis 07/05/2018  . Neck muscle spasm 07/05/2018  .  Upper back pain 07/05/2018  . Gastroesophageal reflux disease without esophagitis 04/16/2018  . Dependent edema 01/01/2017  . Anemia of chronic illness 02/15/2015  . Multiple falls 02/15/2015  . Diabetic neuropathy, type II diabetes mellitus ([place]) 09/09/2012  . Hyperlipidemia associated with type 2 diabetes mellitus ([place]) 05/22/2011  . Hypertension associated with diabetes ([place]) 05/22/2011  . Obesity 12/14/2010  . Diabetes mellitus type 2 with complications ([place]) 76/54/6503  . History of pulmonary embolus (PE) 12/14/2010   Past Medical History:  Diagnosis Date  . Arthritis   . Diabetes mellitus   . Hypertension   . Obesity   . Pulmonary embolus (HCC)     Family History  Problem Relation Age  of Onset  . Hypertension Mother     Past Surgical History:  Procedure Laterality Date  . ABDOMINAL HYSTERECTOMY    . CHOLECYSTECTOMY    . JOINT REPLACEMENT  Right   Social History   Occupational History  . Not on file  Tobacco Use  . Smoking status: Never Smoker  . Smokeless tobacco: Never Used  Substance and Sexual Activity  . Alcohol use: No  . Drug use: No  . Sexual activity: Not Currently

## 2018-11-21 ENCOUNTER — Encounter: Payer: Self-pay | Admitting: Family Medicine

## 2018-12-07 ENCOUNTER — Other Ambulatory Visit: Payer: Self-pay | Admitting: Family Medicine

## 2018-12-07 DIAGNOSIS — E1159 Type 2 diabetes mellitus with other circulatory complications: Secondary | ICD-10-CM

## 2018-12-07 DIAGNOSIS — I152 Hypertension secondary to endocrine disorders: Secondary | ICD-10-CM

## 2018-12-16 ENCOUNTER — Encounter: Payer: Self-pay | Admitting: Orthopedic Surgery

## 2018-12-16 ENCOUNTER — Ambulatory Visit (INDEPENDENT_AMBULATORY_CARE_PROVIDER_SITE_OTHER): Payer: Medicare Other | Admitting: Orthopedic Surgery

## 2018-12-16 ENCOUNTER — Telehealth: Payer: Self-pay | Admitting: Orthopedic Surgery

## 2018-12-16 ENCOUNTER — Other Ambulatory Visit: Payer: Self-pay

## 2018-12-16 VITALS — Ht 64.0 in | Wt 236.0 lb

## 2018-12-16 DIAGNOSIS — Z6841 Body Mass Index (BMI) 40.0 and over, adult: Secondary | ICD-10-CM | POA: Diagnosis not present

## 2018-12-16 DIAGNOSIS — E1142 Type 2 diabetes mellitus with diabetic polyneuropathy: Secondary | ICD-10-CM

## 2018-12-16 DIAGNOSIS — M25572 Pain in left ankle and joints of left foot: Secondary | ICD-10-CM

## 2018-12-16 DIAGNOSIS — M1712 Unilateral primary osteoarthritis, left knee: Secondary | ICD-10-CM | POA: Diagnosis not present

## 2018-12-16 DIAGNOSIS — I872 Venous insufficiency (chronic) (peripheral): Secondary | ICD-10-CM

## 2018-12-16 NOTE — Progress Notes (Signed)
Office Visit Note   Patient: ABRIL CAPPIELLO           Date of Birth: Feb 16, 1941           MRN: [id number] Visit Date: 12/16/2018              Requested by: Denita Lung, MD [address] [place], [place] [address] PCP: Denita Lung, MD  Chief Complaint  Patient presents with  . Left Foot - Follow-up  . Left Knee - Follow-up      HPI: The patient is a 78 yo woman who is seen for left ankle pain. She reports the left knee injection helped some and she has been trying to do her strengthening exercises. She has noted some swelling of the lower extremities as well. She does use an ASO type brace over the left ankle and feels this usually helps some as well.   Assessment & Plan: Visit Diagnoses:  1. Pain in left ankle and joints of left foot   2. Unilateral primary osteoarthritis, left knee   3. Body mass index 40.0-44.9, adult ([place])   4. Diabetic polyneuropathy associated with type 2 diabetes mellitus ([place])   5. Venous insufficiency (chronic) (peripheral)     Plan: After informed consent, the left ankle was injected with a combination of lidocaine and Depo medrol under sterile techniques and the patient tolerated this well. Cautioned to monitor blood sugars following injection.  Continue left ankle ASO bracing.  Prescription for Vive medical compression socks, 15-20 mm Hg XL size provided for the patient.  Follow up in 4 weeks.   Follow-Up Instructions: Return in about 4 weeks (around 01/13/2019).   Ortho Exam  Patient is alert, oriented, no adenopathy, well-dressed, normal affect, normal respiratory effort. Bilateral lower leg edema noted with good palpable pedal pulses bilaterally. The calves are 38 cm.  She is tender to palpation over the left anterior ankle area more laterally than medially. No instability or increased pain with varus/valgus stress.  Non tender over mid foot. Ankle range of motion lacks about 5 degrees from neutral and the patient was instructed  to do Achilles stretching as well.   Imaging: No results found. No images are attached to the encounter.  Labs: Lab Results  Component Value Date   HGBA1C 6.6 (A) 12/18/2018   HGBA1C 6.7 (A) 04/16/2018   HGBA1C 6.5 (A) 01/28/2018   ESRSEDRATE 60 (H) 11/25/2016     Lab Results  Component Value Date   ALBUMIN 3.9 07/09/2018   ALBUMIN 3.9 03/06/2016   ALBUMIN 3.7 01/04/2015    Body mass index is 40.51 kg/m.  Orders:  Orders Placed This Encounter  Procedures  . Medium Joint Inj: L ankle   No orders of the defined types were placed in this encounter.    Procedures: Medium Joint Inj: L ankle on 12/16/2018 12:02 PM Indications: pain and diagnostic evaluation Details: 22 G 1.5 in needle, anteromedial approach Medications: 2 mL lidocaine 1 %; 40 mg methylPREDNISolone acetate 40 MG/ML Outcome: tolerated well, no immediate complications Procedure, treatment alternatives, risks and benefits explained, specific risks discussed. Consent was given by the patient. Immediately prior to procedure a time out was called to verify the correct patient, procedure, equipment, support staff and site/side marked as required. Patient was prepped and draped in the usual sterile fashion.      Clinical Data: No additional findings.  ROS:  All other systems negative, except as noted in the HPI. Review of Systems  Objective:  Vital Signs: Ht 5\' 4"  (1.626 m)   Wt 236 lb (107 kg)   BMI 40.51 kg/m   Specialty Comments:  No specialty comments available.  PMFS History: Patient Active Problem List   Diagnosis Date Noted  . Arthritis 12/18/2018  . Fatigue 07/09/2018  . Gastroesophageal reflux disease without esophagitis 04/16/2018  . Dependent edema 01/01/2017  . Anemia of chronic illness 02/15/2015  . Multiple falls 02/15/2015  . Diabetic neuropathy, type II diabetes mellitus ([place]) 09/09/2012  . Hyperlipidemia associated with type 2 diabetes mellitus ([place]) 05/22/2011  . Hypertension  associated with diabetes ([place]) 05/22/2011  . Morbid obesity ([place]) 12/14/2010  . Diabetes mellitus type 2 with complications ([place]) 29/93/7169  . History of pulmonary embolus (PE) 12/14/2010   Past Medical History:  Diagnosis Date  . Arthritis   . Diabetes mellitus   . Hypertension   . Obesity   . Pulmonary embolus (HCC)     Family History  Problem Relation Age of Onset  . Hypertension Mother     Past Surgical History:  Procedure Laterality Date  . ABDOMINAL HYSTERECTOMY    . CHOLECYSTECTOMY    . JOINT REPLACEMENT  Right   Social History   Occupational History  . Not on file  Tobacco Use  . Smoking status: Never Smoker  . Smokeless tobacco: Never Used  Substance and Sexual Activity  . Alcohol use: No  . Drug use: No  . Sexual activity: Not Currently

## 2018-12-16 NOTE — Telephone Encounter (Signed)
I called and sw pt's son and advised that she was given a rx for 15-20 XL vive compression sock. He states that [place] is requesting the specific ankle and calf measurement and that he will bring his mother back in the office today so that she can have her le measured. I will advise the front desk and we will bring the pt back and get these numbers for them and send back to [facility].

## 2018-12-16 NOTE — Telephone Encounter (Signed)
Patient son called Gianelle Mccaul called stated patient was told to get compression socks and no measurements were taken. Please call him asap so he can pick them up while they are out.  [phone]

## 2018-12-18 ENCOUNTER — Ambulatory Visit: Payer: Medicare Other | Admitting: Family Medicine

## 2018-12-18 ENCOUNTER — Other Ambulatory Visit: Payer: Self-pay

## 2018-12-18 ENCOUNTER — Encounter: Payer: Self-pay | Admitting: Family Medicine

## 2018-12-18 VITALS — BP 144/86 | HR 72 | Temp 97.1°F | Wt 240.6 lb

## 2018-12-18 DIAGNOSIS — D638 Anemia in other chronic diseases classified elsewhere: Secondary | ICD-10-CM | POA: Diagnosis not present

## 2018-12-18 DIAGNOSIS — Z0000 Encounter for general adult medical examination without abnormal findings: Secondary | ICD-10-CM | POA: Diagnosis not present

## 2018-12-18 DIAGNOSIS — E1140 Type 2 diabetes mellitus with diabetic neuropathy, unspecified: Secondary | ICD-10-CM | POA: Diagnosis not present

## 2018-12-18 DIAGNOSIS — I10 Essential (primary) hypertension: Secondary | ICD-10-CM

## 2018-12-18 DIAGNOSIS — E1169 Type 2 diabetes mellitus with other specified complication: Secondary | ICD-10-CM | POA: Diagnosis not present

## 2018-12-18 DIAGNOSIS — R609 Edema, unspecified: Secondary | ICD-10-CM

## 2018-12-18 DIAGNOSIS — E118 Type 2 diabetes mellitus with unspecified complications: Secondary | ICD-10-CM

## 2018-12-18 DIAGNOSIS — M1990 Unspecified osteoarthritis, unspecified site: Secondary | ICD-10-CM

## 2018-12-18 DIAGNOSIS — E1159 Type 2 diabetes mellitus with other circulatory complications: Secondary | ICD-10-CM | POA: Diagnosis not present

## 2018-12-18 DIAGNOSIS — I152 Hypertension secondary to endocrine disorders: Secondary | ICD-10-CM

## 2018-12-18 DIAGNOSIS — K219 Gastro-esophageal reflux disease without esophagitis: Secondary | ICD-10-CM

## 2018-12-18 DIAGNOSIS — E785 Hyperlipidemia, unspecified: Secondary | ICD-10-CM

## 2018-12-18 LAB — POCT GLYCOSYLATED HEMOGLOBIN (HGB A1C): Hemoglobin A1C: 6.6 % — AB (ref 4.0–5.6)

## 2018-12-18 MED ORDER — LOSARTAN POTASSIUM-HCTZ 100-12.5 MG PO TABS: 1.0000 | ORAL_TABLET | Freq: Every day | ORAL | 3 refills | Status: DC

## 2018-12-18 NOTE — Progress Notes (Signed)
Kayla [name] is a 78 y.o. female who presents for annual wellness visit,CPE and follow-up on chronic medical conditions.  She has had continued difficulty with foot pain and is being followed by Dr. Sharol Given for this.  They are taking a conservative approach due to her overall health condition.  This does interfere with her physical activity.  Her mobility due to her weight and foot pain limits her in terms of ADLs.  She does take Tylenol for her arthritis pain.  She does not smoke or drink.  Her sons are involved in her care and are taking good care of her.  She continues on her metformin and does periodically check her blood sugars.  She continues on losartan and Coreg for her blood pressure.  She is also taking iron supplement which does cause some difficulty with constipation for which she is using an OTC med which does seem to help that.  She continues on atorvastatin and does not have any complaints about that.  Apparently the reflux is doing well and she is having no trouble there.  Social and family history was reviewed  Immunizations and Health Maintenance Immunization History  Administered Date(s) Administered  . Influenza Split 07/27/2011  . Influenza, High Dose Seasonal PF 05/08/2013, 08/30/2015, 07/11/2016, 04/16/2018  . Pneumococcal Conjugate-13 01/04/2015  . Pneumococcal Polysaccharide-23 07/11/2016  . Td 02/12/1995, 04/26/2006   Health Maintenance Due  Topic Date Due  . TETANUS/TDAP  04/26/2016  . HEMOGLOBIN A1C  10/15/2018    Last Pap smear:aged out  Last mammogram: 2019 Last colonoscopy:two years ago per pt Last DEXA:03-13-2006 Dentist: over two years Ophtho:01-23-18 Exercise: no  Other doctors caring for patient include:Dr. Mitzie Na, Dr. Elisha Ponder podiatry, Jory Sims NP cardio  Advanced directives:yes copy asked for Does Patient Have a Medical Advance Directive?: Yes Type of Advance Directive: [place] will Does patient want to make  changes to medical advance directive?: No - Patient declined Copy of [place] in Chart?: No - copy requested  Depression screen:  See questionnaire below.  Depression screen [facility] 2/9 12/18/2018 01/28/2018 03/06/2016 02/15/2015 04/01/2014  Decreased Interest 0 0 0 0 0  Down, Depressed, Hopeless 1 0 0 0 0  PHQ - 2 Score 1 0 0 0 0    Fall Risk Screen: see questionnaire below. Fall Risk  12/18/2018 01/28/2018 03/06/2016 02/15/2015 04/01/2014  Falls in the past year? 1 No No Yes No  Number falls in past yr: 0 - - 2 or more -  Injury with Fall? 0 - - No -  Risk Factor Category  - - - High Fall Risk -  Risk for fall due to : - - - History of fall(s) -  Follow up - - - Education provided;Falls prevention discussed -    ADL screen:  See questionnaire below Functional Status Survey: Is the patient deaf or have difficulty hearing?: No Does the patient have difficulty seeing, even when wearing glasses/contacts?: No Does the patient have difficulty concentrating, remembering, or making decisions?: No Does the patient have difficulty walking or climbing stairs?: Yes Does the patient have difficulty dressing or bathing?: No Does the patient have difficulty doing errands alone such as visiting a doctor's office or shopping?: Yes   Review of Systems Constitutional: -, -unexpected weight change, -anorexia, -fatigue incontinence Negative except as above  PHYSICAL EXAM:  BP (!) 144/86 (BP Location: Left Arm, Patient Position: Sitting)   Pulse 72   Temp (!) 97.1 F (36.2 C)  Wt 240 lb 9.6 oz (109.1 kg)   SpO2 97%   BMI 41.30 kg/m   General Appearance: Alert, cooperative, no distress, appears stated age Head: Normocephalic, without obvious abnormality, atraumatic Eyes: PERRL, conjunctiva/corneas clear, EOM's intact, fundi benign Ears: Normal TM's and external ear canals Nose: Nares normal, mucosa normal, no drainage or sinus tenderness Throat: Lips, mucosa, and tongue normal; teeth  and gums normal Neck: Supple, no lymphadenopathy;  thyroid:  no enlargement/tenderness/nodules; no carotid bruit or JVD Lungs: Clear to auscultation bilaterally without wheezes, rales or ronchi; respirations unlabored Heart: Regular rate and rhythm, S1 and S2 normal, no murmur, rubor gallop  Extremities: No clubbing, cyanosis or edema.  Pedal pulses difficult to feel however sensation was normal.  Good capillary refill.  Lateral deviation of the toes was noted. Pulses: 2+ and symmetric all extremities Skin:  Skin color, texture, turgor normal, no rashes or lesions Lymph nodes: Cervical, supraclavicular, and axillary nodes normal Neurologic:  CNII-XII intact, normal strength, sensation and gait; reflexes 2+ and symmetric throughout Psych: Normal mood, affect, hygiene and grooming.  ASSESSMENT/PLAN: Routine general medical examination at a health care facility  Morbid obesity ([place])  Anemia of chronic illness  Hyperlipidemia associated with type 2 diabetes mellitus ([place]) - Plan: Lipid panel  Hypertension associated with diabetes ([place]) - Plan: losartan-hydrochlorothiazide (HYZAAR) 100-12.5 MG tablet  Dependent edema  Diabetes mellitus type 2 with complications (HCC)  Type 2 diabetes mellitus with diabetic neuropathy, unspecified whether long term insulin use (HCC)  Gastroesophageal reflux disease without esophagitis  Arthritis Continue on medications however increase the losartan to see if that will help with her blood pressure.  Also continue on the laxative.  She does note that she does need the Tdap as well as Shingrix vaccines.  Medicare Attestation I have personally reviewed: The patient's medical and social history Their use of alcohol, tobacco or illicit drugs Their current medications and supplements The patient's functional ability including ADLs,fall risks, home safety risks, cognitive, and hearing and visual impairment Diet and physical activities Evidence for  depression or mood disorders  The patient's weight, height, and BMI have been recorded in the chart.  I have made referrals, counseling, and provided education to the patient based on review of the above and I have provided the patient with a written personalized care plan for preventive services.     Jill Alexanders, MD   12/18/2018

## 2018-12-18 NOTE — Patient Instructions (Signed)
  Kayla [name] , Thank you for taking time to come for your Medicare Wellness Visit. I appreciate your ongoing commitment to your health goals. Please review the following plan we discussed and let me know if I can assist you in the future.   These are the goals we discussed: I will increase 5 your blood pressure medications otherwise stay on the present medications.  Get into the [place] program.  Continue with Dr. Sharol Given. This is a list of the screening recommended for you and due dates:  Health Maintenance  Topic Date Due  . Tetanus Vaccine  04/26/2016  . Hemoglobin A1C  10/15/2018  . Eye exam for diabetics  01/24/2019  . Flu Shot  03/01/2019  . Complete foot exam   08/20/2019  . DEXA scan (bone density measurement)  Completed  . Pneumonia vaccines  Completed

## 2018-12-18 NOTE — Addendum Note (Signed)
Addended by: Elyse Jarvis on: 12/18/2018 02:23 PM   Modules accepted: Orders

## 2018-12-19 ENCOUNTER — Other Ambulatory Visit: Payer: Self-pay

## 2018-12-19 ENCOUNTER — Telehealth: Payer: Self-pay | Admitting: Family Medicine

## 2018-12-19 DIAGNOSIS — E1159 Type 2 diabetes mellitus with other circulatory complications: Secondary | ICD-10-CM

## 2018-12-19 DIAGNOSIS — I152 Hypertension secondary to endocrine disorders: Secondary | ICD-10-CM

## 2018-12-19 LAB — LIPID PANEL
Chol/HDL Ratio: 4.6 ratio — ABNORMAL HIGH (ref 0.0–4.4)
Cholesterol, Total: 272 mg/dL — ABNORMAL HIGH (ref 100–199)
HDL: 59 mg/dL (ref >39)
LDL Calculated: 194 mg/dL — ABNORMAL HIGH (ref 0–99)
Triglycerides: 93 mg/dL (ref 0–149)
VLDL Cholesterol Cal: 19 mg/dL (ref 5–40)

## 2018-12-19 MED ORDER — METHYLPREDNISOLONE ACETATE 40 MG/ML IJ SUSP
40.0000 mg | INTRAMUSCULAR | Status: AC | PRN
  Administered 2018-12-16: 40 mg via INTRA_ARTICULAR

## 2018-12-19 MED ORDER — LOSARTAN POTASSIUM-HCTZ 100-12.5 MG PO TABS: 1.0000 | ORAL_TABLET | Freq: Every day | ORAL | 3 refills | Status: DC

## 2018-12-19 MED ORDER — LIDOCAINE HCL 1 % IJ SOLN
2.0000 mL | INTRAMUSCULAR | Status: AC | PRN
  Administered 2018-12-16: 2 mL

## 2018-12-19 NOTE — Telephone Encounter (Signed)
Pt called and states that her RX was sent to the wrong pharmacy her [facility] needs to go to [id number] - [place], [place]

## 2018-12-19 NOTE — Telephone Encounter (Signed)
Med was sent in and pt will be called . [place]

## 2018-12-24 ENCOUNTER — Other Ambulatory Visit: Payer: Self-pay

## 2018-12-24 ENCOUNTER — Telehealth: Payer: Self-pay

## 2018-12-24 MED ORDER — ATORVASTATIN CALCIUM 80 MG PO TABS: ORAL_TABLET | ORAL | 1 refills | Status: DC

## 2018-12-24 NOTE — Progress Notes (Signed)
Order this for me.

## 2018-12-24 NOTE — Telephone Encounter (Signed)
Pt son will be dropping off Parking form some time today. [place]

## 2019-01-13 ENCOUNTER — Other Ambulatory Visit: Payer: Self-pay

## 2019-01-13 ENCOUNTER — Encounter: Payer: Self-pay | Admitting: Physician Assistant

## 2019-01-13 ENCOUNTER — Ambulatory Visit (INDEPENDENT_AMBULATORY_CARE_PROVIDER_SITE_OTHER): Payer: Medicare Other | Admitting: Orthopedic Surgery

## 2019-01-13 VITALS — Ht 64.0 in | Wt 240.0 lb

## 2019-01-13 DIAGNOSIS — I872 Venous insufficiency (chronic) (peripheral): Secondary | ICD-10-CM

## 2019-01-13 DIAGNOSIS — M25572 Pain in left ankle and joints of left foot: Secondary | ICD-10-CM

## 2019-01-13 DIAGNOSIS — Z6841 Body Mass Index (BMI) 40.0 and over, adult: Secondary | ICD-10-CM

## 2019-01-13 NOTE — Progress Notes (Signed)
Office Visit Note   Patient: Kayla [name]           Date of Birth: November 07, 1940           MRN: [id number] Visit Date: 01/13/2019              Requested by: Denita Lung, MD [address] [place],  [place] [address] PCP: Denita Lung, MD  Chief Complaint  Patient presents with  . Left Ankle - Pain, Follow-up      HPI: Patient is an 78 year old woman with midfoot arthritis involving the talonavicular and the navicular medial cuneiform joint she previously is worn a ankle stabilizing orthosis which has helped.  Patient has not obtained compression socks yet she states previous ankle injection provided her very temporary relief.  She currently ambulates with a cane patient is currently not wearing her ankle stabilizing orthosis.  Assessment & Plan: Visit Diagnoses:  1. Pain in left ankle and joints of left foot   2. Body mass index 40.0-44.9, adult (HCC)   3. Venous insufficiency (chronic) (peripheral)     Plan: We will proceed with compression wraps for the left lower extremity to help decrease the venous stasis swelling.  Patient's leg measurements were obtained and she would be a double extra-large compression stocking.  Recommended at follow-up once we get the swelling down sufficiently we should be able to get her into some compression stockings.  Discussed that the midfoot pain is persistent the surgical option would be for a fusion from the talus to navicular to medial cuneiform.  Follow-Up Instructions: Return in about 1 week (around 01/20/2019).   Ortho Exam  Patient is alert, oriented, no adenopathy, well-dressed, normal affect, normal respiratory effort. Examination patient has an antalgic gait she uses a cane she has a good dorsalis pedis pulse she has significant swelling worse in the left than the right leg with a calf measuring 53 cm in circumference of the ankle 30 cm in circumference.  This is significantly increased swelling from her last measurements.   Patient has no tenderness to palpation no pain with dorsiflexion of the ankle no clinical signs of a DVT.  Her radiographs were reviewed which showed arthritic changes across the talonavicular and navicular medial cuneiform joint with degenerative changes in both AP lateral and oblique views.  Imaging: No results found. No images are attached to the encounter.  Labs: Lab Results  Component Value Date   HGBA1C 6.6 (A) 12/18/2018   HGBA1C 6.7 (A) 04/16/2018   HGBA1C 6.5 (A) 01/28/2018   ESRSEDRATE 60 (H) 11/25/2016     Lab Results  Component Value Date   ALBUMIN 3.9 07/09/2018   ALBUMIN 3.9 03/06/2016   ALBUMIN 3.7 01/04/2015    Body mass index is 41.2 kg/m.  Orders:  No orders of the defined types were placed in this encounter.  No orders of the defined types were placed in this encounter.    Procedures: No procedures performed  Clinical Data: No additional findings.  ROS:  All other systems negative, except as noted in the HPI. Review of Systems  Objective: Vital Signs: Ht 5\' 4"  (1.626 m)   Wt 240 lb (108.9 kg)   BMI 41.20 kg/m   Specialty Comments:  No specialty comments available.  PMFS History: Patient Active Problem List   Diagnosis Date Noted  . Arthritis 12/18/2018  . Fatigue 07/09/2018  . Gastroesophageal reflux disease without esophagitis 04/16/2018  . Dependent edema 01/01/2017  . Anemia of chronic illness  02/15/2015  . Multiple falls 02/15/2015  . Diabetic neuropathy, type II diabetes mellitus ([place]) 09/09/2012  . Hyperlipidemia associated with type 2 diabetes mellitus ([place]) 05/22/2011  . Hypertension associated with diabetes ([place]) 05/22/2011  . Morbid obesity ([place]) 12/14/2010  . Diabetes mellitus type 2 with complications ([place]) 80/16/5537  . History of pulmonary embolus (PE) 12/14/2010   Past Medical History:  Diagnosis Date  . Arthritis   . Diabetes mellitus   . Hypertension   . Obesity   . Pulmonary embolus (HCC)     Family  History  Problem Relation Age of Onset  . Hypertension Mother     Past Surgical History:  Procedure Laterality Date  . ABDOMINAL HYSTERECTOMY    . CHOLECYSTECTOMY    . JOINT REPLACEMENT  Right   Social History   Occupational History  . Not on file  Tobacco Use  . Smoking status: Never Smoker  . Smokeless tobacco: Never Used  Substance and Sexual Activity  . Alcohol use: No  . Drug use: No  . Sexual activity: Not Currently

## 2019-01-20 ENCOUNTER — Ambulatory Visit (INDEPENDENT_AMBULATORY_CARE_PROVIDER_SITE_OTHER): Payer: Medicare Other | Admitting: Orthopedic Surgery

## 2019-01-20 ENCOUNTER — Other Ambulatory Visit: Payer: Self-pay

## 2019-01-20 ENCOUNTER — Encounter: Payer: Self-pay | Admitting: Orthopedic Surgery

## 2019-01-20 VITALS — Ht 64.0 in | Wt 240.0 lb

## 2019-01-20 DIAGNOSIS — I872 Venous insufficiency (chronic) (peripheral): Secondary | ICD-10-CM | POA: Diagnosis not present

## 2019-01-23 ENCOUNTER — Encounter: Payer: Self-pay | Admitting: Orthopedic Surgery

## 2019-01-23 NOTE — Progress Notes (Signed)
Office Visit Note   Patient: Kayla [name]           Date of Birth: July 23, 1941           MRN: [id number] Visit Date: 01/20/2019              Requested by: Denita Lung, MD [address] [place],  [place] [address] PCP: Denita Lung, MD  Chief Complaint  Patient presents with  . Left Foot - Pain, Follow-up  . Left Ankle - Pain, Follow-up      HPI: Patient is an 78 year old woman with chronic venous and lymphatic insufficiency.  Patient presents at this time complaining of swelling and pain in the left ankle.  Patient states she has had a compression wrap on for a week for edema.  Assessment & Plan: Visit Diagnoses:  1. Venous insufficiency (chronic) (peripheral)     Plan: Patient's calf measures 53 cm in circumference recommended she get a 2 extra-large compression stocking.  We will wrap her legs with an Ace wrap until she gets the compression stocking.  Follow-Up Instructions: Return in about 4 weeks (around 02/17/2019).   Ortho Exam  Patient is alert, oriented, no adenopathy, well-dressed, normal affect, normal respiratory effort. Examination patient has significant swelling in the calf and ankle left lower extremity.  Her calf measures 53 cm in circumference there is no redness no cellulitis no drainage no open ulcers no signs of infection calf is soft no pain with dorsiflexion of the ankle.  Imaging: No results found. No images are attached to the encounter.  Labs: Lab Results  Component Value Date   HGBA1C 6.6 (A) 12/18/2018   HGBA1C 6.7 (A) 04/16/2018   HGBA1C 6.5 (A) 01/28/2018   ESRSEDRATE 60 (H) 11/25/2016     Lab Results  Component Value Date   ALBUMIN 3.9 07/09/2018   ALBUMIN 3.9 03/06/2016   ALBUMIN 3.7 01/04/2015    Body mass index is 41.2 kg/m.  Orders:  No orders of the defined types were placed in this encounter.  No orders of the defined types were placed in this encounter.    Procedures: No procedures performed   Clinical Data: No additional findings.  ROS:  All other systems negative, except as noted in the HPI. Review of Systems  Objective: Vital Signs: Ht 5\' 4"  (1.626 m)   Wt 240 lb (108.9 kg)   BMI 41.20 kg/m   Specialty Comments:  No specialty comments available.  PMFS History: Patient Active Problem List   Diagnosis Date Noted  . Arthritis 12/18/2018  . Fatigue 07/09/2018  . Gastroesophageal reflux disease without esophagitis 04/16/2018  . Dependent edema 01/01/2017  . Anemia of chronic illness 02/15/2015  . Multiple falls 02/15/2015  . Diabetic neuropathy, type II diabetes mellitus ([place]) 09/09/2012  . Hyperlipidemia associated with type 2 diabetes mellitus ([place]) 05/22/2011  . Hypertension associated with diabetes ([place]) 05/22/2011  . Morbid obesity ([place]) 12/14/2010  . Diabetes mellitus type 2 with complications ([place]) 35/46/5681  . History of pulmonary embolus (PE) 12/14/2010   Past Medical History:  Diagnosis Date  . Arthritis   . Diabetes mellitus   . Hypertension   . Obesity   . Pulmonary embolus (HCC)     Family History  Problem Relation Age of Onset  . Hypertension Mother     Past Surgical History:  Procedure Laterality Date  . ABDOMINAL HYSTERECTOMY    . CHOLECYSTECTOMY    . JOINT REPLACEMENT  Right   Social History  Occupational History  . Not on file  Tobacco Use  . Smoking status: Never Smoker  . Smokeless tobacco: Never Used  Substance and Sexual Activity  . Alcohol use: No  . Drug use: No  . Sexual activity: Not Currently

## 2019-01-27 ENCOUNTER — Telehealth: Payer: Self-pay

## 2019-01-27 NOTE — Telephone Encounter (Signed)
Pt coming in for  Glucose teaching. [place]

## 2019-01-28 ENCOUNTER — Other Ambulatory Visit: Payer: Medicare Other

## 2019-01-28 ENCOUNTER — Other Ambulatory Visit: Payer: Self-pay

## 2019-01-28 MED ORDER — ONETOUCH VERIO VI STRP: ORAL_STRIP | 12 refills | Status: DC

## 2019-01-28 MED ORDER — ONETOUCH DELICA LANCETS 33G MISC: 1.0000 | Freq: Once | 12 refills | Status: AC

## 2019-01-30 ENCOUNTER — Other Ambulatory Visit: Payer: Self-pay | Admitting: Family Medicine

## 2019-01-30 DIAGNOSIS — E1140 Type 2 diabetes mellitus with diabetic neuropathy, unspecified: Secondary | ICD-10-CM

## 2019-02-04 ENCOUNTER — Telehealth: Payer: Self-pay | Admitting: Orthopedic Surgery

## 2019-02-04 NOTE — Telephone Encounter (Signed)
Patients son called in wanting to help mom get something for pain. The last shots and medication is not working. She's still in pain and the swollen and every day living skill she's not able to function much.  There looking for alternates such as relief in inflammation.  Please advise  [phone]

## 2019-02-05 ENCOUNTER — Ambulatory Visit: Payer: Medicare Other | Admitting: Podiatry

## 2019-02-05 ENCOUNTER — Other Ambulatory Visit: Payer: Self-pay

## 2019-02-05 ENCOUNTER — Encounter: Payer: Self-pay | Admitting: Podiatry

## 2019-02-05 VITALS — Temp 97.9°F

## 2019-02-05 DIAGNOSIS — E1142 Type 2 diabetes mellitus with diabetic polyneuropathy: Secondary | ICD-10-CM

## 2019-02-05 DIAGNOSIS — L84 Corns and callosities: Secondary | ICD-10-CM | POA: Diagnosis not present

## 2019-02-05 DIAGNOSIS — M79675 Pain in left toe(s): Secondary | ICD-10-CM

## 2019-02-05 DIAGNOSIS — M79674 Pain in right toe(s): Secondary | ICD-10-CM

## 2019-02-05 DIAGNOSIS — B351 Tinea unguium: Secondary | ICD-10-CM | POA: Diagnosis not present

## 2019-02-05 MED ORDER — DICLOFENAC SODIUM 50 MG PO TBEC: 50.0000 mg | DELAYED_RELEASE_TABLET | Freq: Two times a day (BID) | ORAL | 1 refills | Status: DC

## 2019-02-05 NOTE — Telephone Encounter (Signed)
Start with 2 aleve 2 x a day, most pain relief will come from compression to decrease swelling

## 2019-02-05 NOTE — Telephone Encounter (Signed)
I called and sw the pt's son. She has not obtained the compression socks yet and states that he will pick these up for her. In the meanwhile he states that she is having a lot of foot and ankle pain and wants to know what rx she can be given for inflammation. Pt's son states that he has tried diclofenac and Indomethacin  before in the past and wants to know if this is something that she can be given to help with the pain. Also wants to investigate physical therapy. And states that the will call once he has decided on a facility and advised we can make the referral at that time.

## 2019-02-05 NOTE — Telephone Encounter (Signed)
Then call in diclofenac for her, if shes having a lot of pain then we should see her in the office

## 2019-02-05 NOTE — Telephone Encounter (Signed)
I did advise the son of this and he was very upset stating that this is what she had been advised at her office visit and that he felt like there were other options to take specifically with the medications I mentioned in the first message.Marland KitchenMarland KitchenMarland Kitchen

## 2019-02-06 ENCOUNTER — Telehealth: Payer: Self-pay | Admitting: Orthopedic Surgery

## 2019-02-06 NOTE — Telephone Encounter (Signed)
Can you please call the pt and let him know it should be 15/20 mm/hg compression and that rx for diclofenac was called into the pharm for patient.

## 2019-02-06 NOTE — Telephone Encounter (Signed)
Patient son Kayla [name] called about compression socks. While she was instructed to get 2X but [facility] asked what the level of compression was. Son states he has no idea what that mean.  Please call Kayla [name] [phone]. Both are SUCH NICE PEOPLE!!

## 2019-02-06 NOTE — Progress Notes (Signed)
Subjective:  Kayla [name] presents to clinic today with cc of  painful, thick, discolored, elongated toenails 1-5 b/l that become tender and cannot cut because of thickness. Pain is aggravated when wearing enclosed shoe gear and relieved with periodic professional debridement.   She relates pain in left ankle. She is followed by Ortho who wraps her leg. She is still under care of ortho for her ankle.  Denita Lung, MD is her PCP.    Current Outpatient Medications:  .  amoxicillin (AMOXIL) 875 MG tablet, Take 1 tablet (875 mg total) by mouth 2 (two) times daily., Disp: 20 tablet, Rfl: 0 .  aspirin ([employer] ASPIRIN EC) 325 MG EC tablet, USE PER THE INSTRUCTIONS ON PACKAGE LABEL, Disp: 300 tablet, Rfl: 1 .  atorvastatin (LIPITOR) 80 MG tablet, Take 1 tablet by mouth  daily at 6PM, Disp: 90 tablet, Rfl: 1 .  carvedilol (COREG) 3.125 MG tablet, TAKE 1 TABLET BY MOUTH TWICE A DAY, Disp: 180 tablet, Rfl: 3 .  diclofenac sodium (VOLTAREN) 1 % GEL, Apply 2 g topically 4 (four) times daily., Disp: , Rfl:  .  glucose blood (ONETOUCH VERIO) test strip, Use as instructed, Disp: 100 each, Rfl: 12 .  IRON, FERROUS GLUCONATE, PO, Take by mouth., Disp: , Rfl:  .  Lancets (ONETOUCH DELICA PLUS OHYWVP71G) MISC, , Disp: , Rfl:  .  losartan-hydrochlorothiazide (HYZAAR) 100-12.5 MG tablet, Take 1 tablet by mouth daily., Disp: 90 tablet, Rfl: 3 .  meloxicam (MOBIC) 7.5 MG tablet, TAKE 1 TABLET BY MOUTH EVERY DAY, Disp: 30 tablet, Rfl: 0 .  metFORMIN (GLUCOPHAGE) 500 MG tablet, TAKE 1 TABLET BY MOUTH TWICE A DAY WITH MEALS, Disp: 180 tablet, Rfl: 0 .  methocarbamol (ROBAXIN) 500 MG tablet, Take 1 tablet (500 mg total) by mouth 2 (two) times daily., Disp: 20 tablet, Rfl: 0 .  Multiple Vitamins-Minerals (MULTIVITAMIN WITH MINERALS) tablet, Take 1 tablet by mouth daily., Disp: , Rfl:  .  pantoprazole (PROTONIX) 20 MG tablet, TAKE 1 TABLET BY MOUTH EVERY DAY, Disp: 90 tablet, Rfl: 0 .  TRIAMCINOLONE ACETONIDE, TOP,  0.05 % OINT, Apply to rough skin on heels twice daily, Disp: 430 g, Rfl: 1 .  diclofenac (VOLTAREN) 50 MG EC tablet, Take 1 tablet (50 mg total) by mouth 2 (two) times daily., Disp: 60 tablet, Rfl: 1   No Known Allergies   Objective: Vitals:   02/05/19 1059  Temp: 97.9 F (36.6 C)    Physical Examination:  Vascular Examination: Capillary refill time immediate x 10 digits.  DP pulses 1/4 b/l.  PT pulses diminished b/l.  Digital hair absent b/l  Edema BLE left >right. No pain with calf compression. No increased warmth of either limb.  Skin temperature gradient WNL b/l.  Dermatological Examination: Skin with normal turgor, texture and tone b/l.  No open wounds b/l.  No interdigital macerations noted b/l.  Elongated, thick, discolored brittle toenails with subungual debris and pain on dorsal palpation of nailbeds 1-5 b/l.  Hyperkeratotic lesions b/l heels  with tenderness to palpation. No edema, no erythema, no drainage, no flocculence.  Musculoskeletal Examination: Muscle strength 5/5 to all muscle groups b/l.  Left knee valgus  Pes planovalgus of left foot.  Neurological Examination: Sensation intact 5/5 b/l with 10 gram monofilament.  Vibratory sensation intact b/l.  Proprioceptive sensation intact b/l.  Assessment: Mycotic nail infection with pain 1-5 b/l Callus b/l heels NIDDM with neuropathy  Plan: 1. Toenails 1-5 b/l were debrided in length and girth without  iatrogenic laceration. 2.  Calluses pared b/l heels utilizing sterile scalpel blade without incident. 3.  Continue soft, supportive shoe gear daily. 4.  Report any pedal injuries to medical professional. 5.  Follow up 3 months. 6.  Patient/POA to call should there be a question/concern in there interim.

## 2019-02-07 ENCOUNTER — Other Ambulatory Visit: Payer: Self-pay

## 2019-02-07 DIAGNOSIS — M21962 Unspecified acquired deformity of left lower leg: Secondary | ICD-10-CM

## 2019-02-07 DIAGNOSIS — M25562 Pain in left knee: Secondary | ICD-10-CM

## 2019-02-07 DIAGNOSIS — I872 Venous insufficiency (chronic) (peripheral): Secondary | ICD-10-CM

## 2019-02-07 DIAGNOSIS — G8929 Other chronic pain: Secondary | ICD-10-CM

## 2019-02-07 NOTE — Telephone Encounter (Signed)
Kayla [name],  patient's son was called and informed of Voltaren tabs and also compression socks measurements. He will like PT for patient with [facility]. I informed him that I would put that referral in today and someone will contact him with appt date and information from their facility.

## 2019-02-17 ENCOUNTER — Ambulatory Visit: Payer: Medicare Other | Admitting: Orthopedic Surgery

## 2019-02-20 ENCOUNTER — Ambulatory Visit: Payer: Medicare Other | Attending: Orthopedic Surgery | Admitting: Physical Therapy

## 2019-02-20 ENCOUNTER — Encounter: Payer: Self-pay | Admitting: Physical Therapy

## 2019-02-20 ENCOUNTER — Other Ambulatory Visit: Payer: Self-pay

## 2019-02-20 DIAGNOSIS — G8929 Other chronic pain: Secondary | ICD-10-CM | POA: Insufficient documentation

## 2019-02-20 DIAGNOSIS — M6281 Muscle weakness (generalized): Secondary | ICD-10-CM | POA: Diagnosis present

## 2019-02-20 DIAGNOSIS — R2689 Other abnormalities of gait and mobility: Secondary | ICD-10-CM

## 2019-02-20 DIAGNOSIS — M25562 Pain in left knee: Secondary | ICD-10-CM | POA: Insufficient documentation

## 2019-02-20 DIAGNOSIS — M25572 Pain in left ankle and joints of left foot: Secondary | ICD-10-CM | POA: Diagnosis present

## 2019-02-20 NOTE — Therapy (Signed)
[place], [place], [address] Phone: [phone]   Fax:  [phone]  Physical Therapy Evaluation  Patient Details  Name: Kayla [name] MRN: [id number] Date of Birth: 03/30/1941 Referring Provider (PT): Newt Minion, MD    Encounter Date: 02/20/2019  PT End of Session - 02/20/19 1239    Visit Number  1    Number of Visits  17    Date for PT Re-Evaluation  04/17/19    Authorization Type  MCR: Kx mod by 15th visit, progress note at 10th    PT Start Time  1232    PT Stop Time  1320    PT Time Calculation (min)  48 min    Activity Tolerance  Patient tolerated treatment well    Behavior During Therapy  [facility] for tasks assessed/performed       Past Medical History:  Diagnosis Date  . Arthritis   . Diabetes mellitus   . Hypertension   . Obesity   . Pulmonary embolus [facility])     Past Surgical History:  Procedure Laterality Date  . ABDOMINAL HYSTERECTOMY    . CHOLECYSTECTOMY    . JOINT REPLACEMENT  Right    There were no vitals filed for this visit.   Subjective Assessment - 02/20/19 1248    Subjective  pt is a 78 y.o F with CC or L ankle/ knee pain that started about 3-4 months with no specific MOI, which she feels could be from favoring the R leg and putting more weight through the LLE. She notes the pain fluctuates depending on activity. The ankle is worse than the knee, and has severely limited her general mobliity and can only ambulate limited distances. she notes having corisone injections last month in the ankle noting no improvement    Limitations  Standing;Walking    How long can you sit comfortably?  30-40 min    How long can you stand comfortably?  10 min    How long can you walk comfortably?  30 min    Diagnostic tests  x-rays at MD's office    Patient Stated Goals  be able to function, be able to walk, increase strength    Currently in Pain?  Yes    Pain Score  7    at worst gets up to  10/10   Pain Location  Knee    Pain Orientation  Left    Pain Descriptors / Indicators  Aching    Pain Type  Chronic pain    Pain Onset  More than a month ago    Pain Frequency  Intermittent    Aggravating Factors   standing/ walking, prolonged sitting    Pain Relieving Factors  icy hot, voltaren    Effect of Pain on Daily Activities  limited standing/ walking    Multiple Pain Sites  Yes    Pain Score  8   at worst 10/10   Pain Location  Ankle    Pain Orientation  Left    Pain Descriptors / Indicators  Shooting    Pain Type  Chronic pain    Pain Onset  More than a month ago    Pain Frequency  Intermittent    Aggravating Factors   prolonged sitting, prlonged walking/ standing    Pain Relieving Factors  moving the ankle, voltaren, icy hot    Effect of Pain on Daily Activities  limited standing/ walking  [facility] PT Assessment - 02/20/19 0001      Assessment   Medical Diagnosis  Venous insufficiency (chronic) (peripheral),  Left ankle joint deformity, Chronic pain of left knee      Referring Provider (PT)  Newt Minion, MD     Onset Date/Surgical Date  --   3-4 months   Hand Dominance  Right    Next MD Visit  --   needs to make one   Prior Therapy  yes      Precautions   Precautions  None      Restrictions   Weight Bearing Restrictions  No      Balance Screen   Has the patient fallen in the past 6 months  Yes    How many times?  1    Has the patient had a decrease in activity level because of a fear of falling?   No    Is the patient reluctant to leave their home because of a fear of falling?   No      Home Environment   Living Environment  Private residence    Living Arrangements  Alone    Type of [place] Access  Level entry    [place] - single point;Toilet riser;Grab bars - tub/shower      Prior Function   Level of Independence  Independent with basic ADLs    Vocation  Retired      Naval architect Status  Within Functional Limits for tasks assessed      Observation/Other Assessments   Observations  L foot in everted / pronated postion    Focus on Therapeutic Outcomes (FOTO)   71% limited   predicted 44% limited     Posture/Postural Control   Posture/Postural Control  Postural limitations    Postural Limitations  Rounded Shoulders;Forward head;Flexed trunk      ROM / Strength   AROM / PROM / Strength  AROM;PROM;Strength      AROM   AROM Assessment Site  Knee;Ankle    Right/Left Knee  Right;Left    Right Knee Extension  4    Right Knee Flexion  113    Left Knee Extension  10    Left Knee Flexion  110    Right/Left Ankle  Right;Left    Right Ankle Dorsiflexion  2    Right Ankle Plantar Flexion  45    Right Ankle Inversion  10    Right Ankle Eversion  8    Left Ankle Dorsiflexion  4    Left Ankle Plantar Flexion  38    Left Ankle Inversion  2   past neutral   Left Ankle Eversion  2   pt resting pos in 20 degrees of eversion     Strength   Strength Assessment Site  Knee;Ankle;Hip    Right/Left Hip  Right;Left    Right Hip Flexion  4/5    Right Hip ABduction  3+/5    Left Hip Flexion  3+/5    Left Hip ABduction  3/5    Right/Left Knee  Right;Left    Right Knee Flexion  4+/5    Right Knee Extension  4+/5    Left Knee Flexion  3+/5   reproduction of concordant symptoms   Left Knee Extension  3+/5   reproduction of concordant symptoms   Right/Left Ankle  Left;Right  Right Ankle Dorsiflexion  4+/5   in available ROM   Right Ankle Plantar Flexion  4-/5    Right Ankle Inversion  4-/5    Right Ankle Eversion  4-/5    Left Ankle Dorsiflexion  4+/5   in available ROM   Left Ankle Plantar Flexion  4/5    Left Ankle Inversion  4-/5    Left Ankle Eversion  4-/5      Ambulation/Gait   Ambulation/Gait  Yes    Gait Pattern  Step-through pattern;Decreased stride length;Decreased stance time - left;Decreased step length -  right;Trendelenburg;Antalgic;Trunk flexed    Pre-Gait Activities  pt was     Gait Comments  required use of W/C to make it to treatment area due to limited endurance with SPC      Timed Up and Go Test   Normal TUG (seconds)  38                Objective measurements completed on examination: See above findings.      [facility] Adult PT Treatment/Exercise - 02/20/19 0001      Exercises   Exercises  Knee/Hip;Ankle      Knee/Hip Exercises: Seated   Long Arc Quad  Left;1 set;5 reps;Strengthening    Heel Slides  1 set;5 reps;Strengthening;Left    Abduction/Adduction   1 set;10 reps   red theraband    Abd/Adduction Limitations  clamshell      Ankle Exercises: Seated   ABC's  1 rep             PT Education - 02/20/19 1333    Education Details  evaluation findings, POC, goals, HEP with proper form / rationale,    Person(s) Educated  Patient    Methods  Explanation;Verbal cues;Handout    Comprehension  Verbalized understanding;Verbal cues required       PT Short Term Goals - 02/20/19 1343      PT SHORT TERM GOAL #1   Title  pt to be I with inital HEP    Baseline  no previous HEP    Time  4    Period  Weeks    Status  New    Target Date  03/20/19      PT SHORT TERM GOAL #2   Title  pt to verbalize / demo techniques to reduce L ankle/ knee pain via RICE and HEP    Time  4    Period  Weeks    Status  New    Target Date  03/20/19      PT SHORT TERM GOAL #3   Title  pt to be able to ambulate >/= with [facility] for >/= 15 min with </= 2/10 pain for functional mobility functional endurnace progression    Time  4    Period  Weeks    Status  Achieved    Target Date  03/20/19        PT Long Term Goals - 02/20/19 1346      PT LONG TERM GOAL #1   Title  increase L ankle eversion/ PF by >/= 15 degrees to promote proper posture and assist with efficent mechanics    Time  8    Period  Weeks    Status  New    Target Date  04/17/19      PT LONG TERM GOAL #2   Title   pt to be able to stand and walk >/= 30-45 min with SPC with no report of pain  or instability for functional endurance required for community ambulation and safety    Time  8    Period  Weeks    Status  New    Target Date  04/17/19      PT LONG TERM GOAL #3   Title  increase LLE strength grossly to >/= 4/5 in to promote hip/ knee stability and maximize for walking/ standing    Time  8    Period  Weeks    Status  New    Target Date  04/17/19      PT LONG TERM GOAL #4   Title  improve TUG by >/= 10 sec to promote functional mobility    Time  8    Period  Weeks    Status  New    Target Date  04/17/19      PT LONG TERM GOAL #5   Title  increase FOTO score to </= 44% limited to demo improvement in function    Time  8    Period  Weeks    Status  New    Target Date  04/17/19             Plan - 02/20/19 1333    Clinical Impression Statement  pt presents to OPPT with CC on L ankle and knee pain that start 2-3 months ago with no specific MOI. She demonstrates limited L ankle and knee ROM and strength with reproduction of concordant pain, and exhibits L foot everted positioning. She currently utilizes [facility] with ambulation but demonstrates limited stability and endurance requiring WC to come back to the treatment room. She would benefit from physical therapy to increase ankle and knee ROM, increase strength, promote funcitonal endurance/ mobility with LRAD, and maxmize her function by addressing the deficits listed.    Personal Factors and Comorbidities  Age;Comorbidity 3+;Other    Comorbidities  hx or PE, DM, vascular insuffency, pt lives alone    Examination-Activity Limitations  Stand;Stairs;Squat;Transfers;Locomotion Level    Examination-Participation Restrictions  Cleaning    Stability/Clinical Decision Making  Unstable/Unpredictable    Clinical Decision Making  High    Rehab Potential  Good    PT Frequency  2x / week    PT Duration  8 weeks    PT Treatment/Interventions   ADLs/Self Care Home Management;Cryotherapy;Electrical Stimulation;Iontophoresis 4mg /ml Dexamethasone;Moist Heat;Ultrasound;Therapeutic activities;Therapeutic exercise;Balance training;Manual techniques;Dry needling;Passive range of motion;Patient/family education;Taping    PT Next Visit Plan  review/ update HEP, ankle ROM/ strengthening, hip / knee strengthening, gait training, Nu-step    PT Home Exercise Plan  heel slides, seated clamshell, LAQ, ankle ABC, towel scrunch    Consulted and Agree with Plan of Care  Patient       Patient will benefit from skilled therapeutic intervention in order to improve the following deficits and impairments:  Pain, Obesity, Postural dysfunction, Improper body mechanics, Decreased activity tolerance, Abnormal gait, Decreased mobility, Decreased strength, Difficulty walking, Decreased balance, Decreased endurance, Increased edema, Decreased range of motion, Decreased safety awareness  Visit Diagnosis: 1. Chronic pain of left knee   2. Pain in left ankle and joints of left foot   3. Muscle weakness (generalized)   4. Other abnormalities of gait and mobility        Problem List Patient Active Problem List   Diagnosis Date Noted  . Arthritis 12/18/2018  . Fatigue 07/09/2018  . Gastroesophageal reflux disease without esophagitis 04/16/2018  . Dependent edema 01/01/2017  . Anemia of chronic illness 02/15/2015  . Multiple  falls 02/15/2015  . Diabetic neuropathy, type II diabetes mellitus ([place]) 09/09/2012  . Hyperlipidemia associated with type 2 diabetes mellitus ([place]) 05/22/2011  . Hypertension associated with diabetes ([place]) 05/22/2011  . Morbid obesity ([place]) 12/14/2010  . Diabetes mellitus type 2 with complications ([place]) 49/97/1820  . History of pulmonary embolus ([place]) 12/14/2010   [place] PT, DPT, LAT, ATC  02/20/19  2:00 PM      [facility] [address] [place], [place],  [address] Phone: [phone]   Fax:  [phone]  Name: Kayla [name] MRN: [id number] Date of Birth: 1940/10/19

## 2019-02-25 ENCOUNTER — Ambulatory Visit: Payer: Medicare Other | Admitting: Physical Therapy

## 2019-02-25 ENCOUNTER — Other Ambulatory Visit: Payer: Self-pay

## 2019-02-25 ENCOUNTER — Encounter: Payer: Self-pay | Admitting: Physical Therapy

## 2019-02-25 DIAGNOSIS — G8929 Other chronic pain: Secondary | ICD-10-CM

## 2019-02-25 DIAGNOSIS — M25562 Pain in left knee: Secondary | ICD-10-CM

## 2019-02-25 DIAGNOSIS — M25572 Pain in left ankle and joints of left foot: Secondary | ICD-10-CM

## 2019-02-25 DIAGNOSIS — M6281 Muscle weakness (generalized): Secondary | ICD-10-CM

## 2019-02-25 DIAGNOSIS — R2689 Other abnormalities of gait and mobility: Secondary | ICD-10-CM

## 2019-02-25 NOTE — Therapy (Signed)
[place], [place], [address] Phone: [phone]   Fax:  [phone]  Physical Therapy Treatment  Patient Details  Name: Kayla [name] MRN: [id number] Date of Birth: 1940-11-18 Referring Provider (PT): Newt Minion, MD    Encounter Date: 02/25/2019  PT End of Session - 02/25/19 0917    Visit Number  2    Number of Visits  17    Date for PT Re-Evaluation  04/17/19    Authorization Type  MCR: Kx mod by 15th visit, progress note at 10th    PT Start Time  0915    PT Stop Time  0956    PT Time Calculation (min)  41 min    Equipment Utilized During Treatment  Gait belt    Activity Tolerance  Patient tolerated treatment well    Behavior During Therapy  [facility] for tasks assessed/performed       Past Medical History:  Diagnosis Date  . Arthritis   . Diabetes mellitus   . Hypertension   . Obesity   . Pulmonary embolus [facility])     Past Surgical History:  Procedure Laterality Date  . ABDOMINAL HYSTERECTOMY    . CHOLECYSTECTOMY    . JOINT REPLACEMENT  Right    There were no vitals filed for this visit.  Subjective Assessment - 02/25/19 0918    Subjective  "I am sitll having pain in the ankle,    Currently in Pain?  Yes    Pain Score  8     Pain Location  Knee    Pain Orientation  Left    Pain Score  7    Pain Location  Ankle    Pain Orientation  Left         [facility] PT Assessment - 02/25/19 0001      Assessment   Medical Diagnosis  Venous insufficiency (chronic) (peripheral),  Left ankle joint deformity, Chronic pain of left knee      Referring Provider (PT)  Newt Minion, MD                    [facility] Adult PT Treatment/Exercise - 02/25/19 0001      Knee/Hip Exercises: Stretches   Active Hamstring Stretch  2 reps;30 seconds    Gastroc Stretch  2 reps;30 seconds;Left   seated manually     Knee/Hip Exercises: Aerobic   Nustep  L4 x 5 min UE/LE   required assistane with  foot/placement     Knee/Hip Exercises: Standing   Gait Training  2 x 30 ft with SPC cues to pick up the L foot and prevent it from dragging      Knee/Hip Exercises: Seated   Long Arc Quad  2 sets;10 reps    Long Arc Quad Weight  3 lbs.    Long [employer] Limitations  tactile cues for proper form    Heel Slides  1 set;10 reps    Clamshell with TheraBand  Red   2 x 10    Marching  2 sets;10 reps   3# on LLE   Marching Limitations  tactile cues for form    Abduction/Adduction   1 set;10 reps   ball squeeze     Ankle Exercises: Seated   ABC's  2 reps   pt requires frequent cues to do more than Df/PF   Towel Crunch  --   2 x 20 reps (difficulty pulling towel)   Towel Inversion/Eversion  --  inversion 2 x 10    Heel Raises  15 reps   ball between the knees    Toe Raise  15 reps   ball between the knees to promote proper form            PT Education - 02/25/19 0947    Education Details  reviewed previous HEP and importance of consistency    Person(s) Educated  Patient    Methods  Explanation;Verbal cues    Comprehension  Verbalized understanding;Verbal cues required       PT Short Term Goals - 02/20/19 1343      PT SHORT TERM GOAL #1   Title  pt to be I with inital HEP    Baseline  no previous HEP    Time  4    Period  Weeks    Status  New    Target Date  03/20/19      PT SHORT TERM GOAL #2   Title  pt to verbalize / demo techniques to reduce L ankle/ knee pain via RICE and HEP    Time  4    Period  Weeks    Status  New    Target Date  03/20/19      PT SHORT TERM GOAL #3   Title  pt to be able to ambulate >/= with [facility] for >/= 15 min with </= 2/10 pain for functional mobility functional endurnace progression    Time  4    Period  Weeks    Status  Achieved    Target Date  03/20/19        PT Long Term Goals - 02/20/19 1346      PT LONG TERM GOAL #1   Title  increase L ankle eversion/ PF by >/= 15 degrees to promote proper posture and assist with  efficent mechanics    Time  8    Period  Weeks    Status  New    Target Date  04/17/19      PT LONG TERM GOAL #2   Title  pt to be able to stand and walk >/= 30-45 min with SPC with no report of pain or instability for functional endurance required for community ambulation and safety    Time  8    Period  Weeks    Status  New    Target Date  04/17/19      PT LONG TERM GOAL #3   Title  increase LLE strength grossly to >/= 4/5 in to promote hip/ knee stability and maximize for walking/ standing    Time  8    Period  Weeks    Status  New    Target Date  04/17/19      PT LONG TERM GOAL #4   Title  improve TUG by >/= 10 sec to promote functional mobility    Time  8    Period  Weeks    Status  New    Target Date  04/17/19      PT LONG TERM GOAL #5   Title  increase FOTO score to </= 44% limited to demo improvement in function    Time  8    Period  Weeks    Status  New    Target Date  04/17/19            Plan - 02/25/19 0948    Clinical Impression Statement  pt reports increased ankle /knee pain rating both betwee  7-8/10. reviewed HEP which she required frequent cues and reminder of form. following LLE strengthening she noted decreased pain in the knee and ankle. she was able to amb with [facility] utilzing SPC, but does drag the L foot requiring cues to DF which she did well with.    PT Treatment/Interventions  ADLs/Self Care Home Management;Cryotherapy;Electrical Stimulation;Iontophoresis 4mg /ml Dexamethasone;Moist Heat;Ultrasound;Therapeutic activities;Therapeutic exercise;Balance training;Manual techniques;Dry needling;Passive range of motion;Patient/family education;Taping    PT Next Visit Plan  update HEP PRN. , ankle ROM/ strengthening, hip / knee strengthening, gait training, Nu-step, calf stretching, hamstring stretching    PT Home Exercise Plan  heel slides, seated clamshell, LAQ, ankle ABC, towel scrunch    Consulted and Agree with Plan of Care  Patient       Patient  will benefit from skilled therapeutic intervention in order to improve the following deficits and impairments:  Pain, Obesity, Postural dysfunction, Improper body mechanics, Decreased activity tolerance, Abnormal gait, Decreased mobility, Decreased strength, Difficulty walking, Decreased balance, Decreased endurance, Increased edema, Decreased range of motion, Decreased safety awareness  Visit Diagnosis: 1. Chronic pain of left knee   2. Muscle weakness (generalized)   3. Pain in left ankle and joints of left foot   4. Other abnormalities of gait and mobility        Problem List Patient Active Problem List   Diagnosis Date Noted  . Arthritis 12/18/2018  . Fatigue 07/09/2018  . Gastroesophageal reflux disease without esophagitis 04/16/2018  . Dependent edema 01/01/2017  . Anemia of chronic illness 02/15/2015  . Multiple falls 02/15/2015  . Diabetic neuropathy, type II diabetes mellitus ([place]) 09/09/2012  . Hyperlipidemia associated with type 2 diabetes mellitus ([place]) 05/22/2011  . Hypertension associated with diabetes ([place]) 05/22/2011  . Morbid obesity ([place]) 12/14/2010  . Diabetes mellitus type 2 with complications ([place]) 63/87/5643  . History of pulmonary embolus (PE) 12/14/2010   Starr Lake PT, DPT, LAT, ATC  02/25/19  9:58 AM      [facility] [address] [place], [place], [address] Phone: [phone]   Fax:  [phone]  Name: YING BLANKENHORN MRN: [id number] Date of Birth: 10/04/1940

## 2019-02-26 ENCOUNTER — Ambulatory Visit: Payer: Medicare Other | Admitting: Physical Therapy

## 2019-03-04 ENCOUNTER — Ambulatory Visit: Payer: Medicare Other | Attending: Orthopedic Surgery | Admitting: Physical Therapy

## 2019-03-04 ENCOUNTER — Other Ambulatory Visit: Payer: Self-pay

## 2019-03-04 DIAGNOSIS — R2689 Other abnormalities of gait and mobility: Secondary | ICD-10-CM | POA: Insufficient documentation

## 2019-03-04 DIAGNOSIS — M25572 Pain in left ankle and joints of left foot: Secondary | ICD-10-CM | POA: Diagnosis present

## 2019-03-04 DIAGNOSIS — M25562 Pain in left knee: Secondary | ICD-10-CM | POA: Insufficient documentation

## 2019-03-04 DIAGNOSIS — G8929 Other chronic pain: Secondary | ICD-10-CM

## 2019-03-04 DIAGNOSIS — M6281 Muscle weakness (generalized): Secondary | ICD-10-CM | POA: Diagnosis present

## 2019-03-04 NOTE — Therapy (Signed)
[place], [place], [address] Phone: [phone]   Fax:  [phone]  Physical Therapy Treatment  Patient Details  Name: Kayla [name] MRN: [id number] Date of Birth: 02-28-1941 Referring Provider (PT): Newt Minion, MD    Encounter Date: 03/04/2019  PT End of Session - 03/04/19 1101    Visit Number  3    Number of Visits  17    Date for PT Re-Evaluation  04/17/19    Authorization Type  MCR: Kx mod by 15th visit, progress note at 10th    PT Start Time  1101    PT Stop Time  1140    PT Time Calculation (min)  39 min    Activity Tolerance  Patient tolerated treatment well    Behavior During Therapy  [facility] for tasks assessed/performed       Past Medical History:  Diagnosis Date  . Arthritis   . Diabetes mellitus   . Hypertension   . Obesity   . Pulmonary embolus [facility])     Past Surgical History:  Procedure Laterality Date  . ABDOMINAL HYSTERECTOMY    . CHOLECYSTECTOMY    . JOINT REPLACEMENT  Right    There were no vitals filed for this visit.  Subjective Assessment - 03/04/19 1101    Subjective  "I felt pretty good after the last session but did feel kind of sore since then"    Currently in Pain?  Yes    Pain Score  7     Pain Location  Knee    Pain Orientation  Left    Pain Descriptors / Indicators  Aching    Pain Type  Chronic pain    Pain Onset  More than a month ago    Pain Frequency  Intermittent    Aggravating Factors   standing/ walking    Pain Relieving Factors  icy hot, voltaren, exercise    Pain Score  7    Pain Location  Ankle    Pain Orientation  Left    Pain Onset  More than a month ago    Pain Frequency  Intermittent    Aggravating Factors   standing/ walking    Pain Relieving Factors  moving the ankle around                       [facility] Adult PT Treatment/Exercise - 03/04/19 0001      Knee/Hip Exercises: Stretches   Passive Hamstring Stretch  2  reps;30 seconds    Gastroc Stretch  2 reps;30 seconds;Left   seated with strap     Knee/Hip Exercises: Aerobic   Nustep  L4 x 6 min UE/LE      Knee/Hip Exercises: Standing   Gait Training  132 with SPC    with gait belt   Other Standing Knee Exercises  toe taps on 4 inch step 2 x 10 in //      Knee/Hip Exercises: Seated   Long Arc Quad  2 sets;15 reps    Long Arc Quad Weight  3 lbs.    Marching  2 sets;15 reps;Both;Strengthening    Marching Weights  3 lbs.    Abduction/Adduction   2 sets;20 reps   red theraband     Ankle Exercises: Seated   ABC's  2 reps    Heel Raises  15 reps    Toe Raise  15 reps    Other Seated Ankle Exercises  invversion/ eversion pivoting around heel 2 x 10    Other Seated Ankle Exercises  yellow theraband 2 x 10 DF/PF and Inversion/ eversion   cues for inversion to prevent hip IR to assist with mtion            PT Education - 03/04/19 1119    Education Details  updated HEP for seated calf stretch    Person(s) Educated  Patient    Methods  Explanation       PT Short Term Goals - 02/20/19 1343      PT SHORT TERM GOAL #1   Title  pt to be I with inital HEP    Baseline  no previous HEP    Time  4    Period  Weeks    Status  New    Target Date  03/20/19      PT SHORT TERM GOAL #2   Title  pt to verbalize / demo techniques to reduce L ankle/ knee pain via RICE and HEP    Time  4    Period  Weeks    Status  New    Target Date  03/20/19      PT SHORT TERM GOAL #3   Title  pt to be able to ambulate >/= with [facility] for >/= 15 min with </= 2/10 pain for functional mobility functional endurnace progression    Time  4    Period  Weeks    Status  Achieved    Target Date  03/20/19        PT Long Term Goals - 02/20/19 1346      PT LONG TERM GOAL #1   Title  increase L ankle eversion/ PF by >/= 15 degrees to promote proper posture and assist with efficent mechanics    Time  8    Period  Weeks    Status  New    Target Date  04/17/19       PT LONG TERM GOAL #2   Title  pt to be able to stand and walk >/= 30-45 min with SPC with no report of pain or instability for functional endurance required for community ambulation and safety    Time  8    Period  Weeks    Status  New    Target Date  04/17/19      PT LONG TERM GOAL #3   Title  increase LLE strength grossly to >/= 4/5 in to promote hip/ knee stability and maximize for walking/ standing    Time  8    Period  Weeks    Status  New    Target Date  04/17/19      PT LONG TERM GOAL #4   Title  improve TUG by >/= 10 sec to promote functional mobility    Time  8    Period  Weeks    Status  New    Target Date  04/17/19      PT LONG TERM GOAL #5   Title  increase FOTO score to </= 44% limited to demo improvement in function    Time  8    Period  Weeks    Status  New    Target Date  04/17/19            Plan - 03/04/19 1140    Clinical Impression Statement  continued emphasis on LLE strength working from hip to ankle. She continues to demonstrate difficulty with inversion due  to limited ROM. she was able to amb 132 ft today with SPC. She reported no increase in pain but did note fatigue.    PT Next Visit Plan  update HEP PRN. , ankle ROM/ strengthening, hip / knee strengthening, gait training, Nu-step, calf stretching, hamstring stretching    PT Home Exercise Plan  heel slides, seated clamshell, LAQ, ankle ABC, towel scrunch, seated calf stretch    Consulted and Agree with Plan of Care  Patient       Patient will benefit from skilled therapeutic intervention in order to improve the following deficits and impairments:     Visit Diagnosis: 1. Chronic pain of left knee   2. Muscle weakness (generalized)   3. Pain in left ankle and joints of left foot   4. Other abnormalities of gait and mobility        Problem List Patient Active Problem List   Diagnosis Date Noted  . Arthritis 12/18/2018  . Fatigue 07/09/2018  . Gastroesophageal reflux disease without  esophagitis 04/16/2018  . Dependent edema 01/01/2017  . Anemia of chronic illness 02/15/2015  . Multiple falls 02/15/2015  . Diabetic neuropathy, type II diabetes mellitus ([place]) 09/09/2012  . Hyperlipidemia associated with type 2 diabetes mellitus ([place]) 05/22/2011  . Hypertension associated with diabetes ([place]) 05/22/2011  . Morbid obesity ([place]) 12/14/2010  . Diabetes mellitus type 2 with complications ([place]) 45/36/4680  . History of pulmonary embolus (PE) 12/14/2010   Starr Lake PT, DPT, LAT, ATC  03/04/19  11:43 AM      [facility] [address] [place], [place], [address] Phone: [phone]   Fax:  [phone]  Name: Kayla [name] MRN: [id number] Date of Birth: 01/20/1941

## 2019-03-06 ENCOUNTER — Other Ambulatory Visit: Payer: Self-pay

## 2019-03-06 ENCOUNTER — Ambulatory Visit: Payer: Medicare Other | Admitting: Physical Therapy

## 2019-03-06 ENCOUNTER — Encounter: Payer: Self-pay | Admitting: Physical Therapy

## 2019-03-06 DIAGNOSIS — M25572 Pain in left ankle and joints of left foot: Secondary | ICD-10-CM

## 2019-03-06 DIAGNOSIS — R2689 Other abnormalities of gait and mobility: Secondary | ICD-10-CM

## 2019-03-06 DIAGNOSIS — M6281 Muscle weakness (generalized): Secondary | ICD-10-CM

## 2019-03-06 DIAGNOSIS — G8929 Other chronic pain: Secondary | ICD-10-CM

## 2019-03-06 DIAGNOSIS — M25562 Pain in left knee: Secondary | ICD-10-CM | POA: Diagnosis not present

## 2019-03-06 NOTE — Patient Instructions (Signed)
  Elevate feet above heart BID 15 min

## 2019-03-06 NOTE — Therapy (Signed)
[place], [place], [address] Phone: [phone]   Fax:  [phone]  Physical Therapy Treatment  Patient Details  Name: Kayla [name] MRN: [id number] Date of Birth: 26-Nov-1940 Referring Provider (PT): Newt Minion, MD    Encounter Date: 03/06/2019  PT End of Session - 03/06/19 1104    Visit Number  4    Number of Visits  17    Date for PT Re-Evaluation  04/17/19    Authorization Type  MCR: Kx mod by 15th visit, progress note at 10th    PT Start Time  1105    PT Stop Time  1147    PT Time Calculation (min)  42 min    Activity Tolerance  Patient tolerated treatment well    Behavior During Therapy  [facility] for tasks assessed/performed       Past Medical History:  Diagnosis Date  . Arthritis   . Diabetes mellitus   . Hypertension   . Obesity   . Pulmonary embolus [facility])     Past Surgical History:  Procedure Laterality Date  . ABDOMINAL HYSTERECTOMY    . CHOLECYSTECTOMY    . JOINT REPLACEMENT  Right    There were no vitals filed for this visit.  Subjective Assessment - 03/06/19 1109    Subjective  I had some terrible sharp pain shoot across my foot, it took me 5 hours to get to the bathroom.    Patient Stated Goals  be able to function, be able to walk, increase strength    Currently in Pain?  Yes    Pain Score  7     Pain Location  Ankle    Pain Orientation  Left;Anterior    Pain Descriptors / Indicators  Shooting;[facility] Adult PT Treatment/Exercise - 03/06/19 0001      Knee/Hip Exercises: Stretches   Passive Hamstring Stretch  2 reps;30 seconds    Passive Hamstring Stretch Limitations  seated edge of bed      Knee/Hip Exercises: Standing   Gait Training  with SPC 50 ft      Knee/Hip Exercises: Seated   Heel Slides  1 set;10 reps    Heel Slides Limitations  cues to keep foot forward      Knee/Hip Exercises: Supine   Quad Sets  10 reps;Left     Short Arc Quad Sets  15 reps    Hip Adduction Isometric Limitations  ball squeeze    Other Supine Knee/Hip Exercises  ankle circles, ankle inversion, toe extension/flx    Other Supine Knee/Hip Exercises  glut sets & mini bridges legs over bolster      Manual Therapy   Manual therapy comments  passive ankle inversion             PT Education - 03/06/19 1236    Education Details  bolster, spatula for cream, shoe BOS    Person(s) Educated  Patient    Methods  Explanation    Comprehension  Verbalized understanding       PT Short Term Goals - 02/20/19 1343      PT SHORT TERM GOAL #1   Title  pt to be I with inital HEP    Baseline  no previous HEP    Time  4    Period  Weeks    Status  New  Target Date  03/20/19      PT SHORT TERM GOAL #2   Title  pt to verbalize / demo techniques to reduce L ankle/ knee pain via RICE and HEP    Time  4    Period  Weeks    Status  New    Target Date  03/20/19      PT SHORT TERM GOAL #3   Title  pt to be able to ambulate >/= with [facility] for >/= 15 min with </= 2/10 pain for functional mobility functional endurnace progression    Time  4    Period  Weeks    Status  Achieved    Target Date  03/20/19        PT Long Term Goals - 02/20/19 1346      PT LONG TERM GOAL #1   Title  increase L ankle eversion/ PF by >/= 15 degrees to promote proper posture and assist with efficent mechanics    Time  8    Period  Weeks    Status  New    Target Date  04/17/19      PT LONG TERM GOAL #2   Title  pt to be able to stand and walk >/= 30-45 min with SPC with no report of pain or instability for functional endurance required for community ambulation and safety    Time  8    Period  Weeks    Status  New    Target Date  04/17/19      PT LONG TERM GOAL #3   Title  increase LLE strength grossly to >/= 4/5 in to promote hip/ knee stability and maximize for walking/ standing    Time  8    Period  Weeks    Status  New    Target Date  04/17/19       PT LONG TERM GOAL #4   Title  improve TUG by >/= 10 sec to promote functional mobility    Time  8    Period  Weeks    Status  New    Target Date  04/17/19      PT LONG TERM GOAL #5   Title  increase FOTO score to </= 44% limited to demo improvement in function    Time  8    Period  Weeks    Status  New    Target Date  04/17/19            Plan - 03/06/19 1111    Clinical Impression Statement  focused on supine exercises today for strengthening as foot was significantly swollen- discussed a wide base shoe is necessary as her arch is collapsing outside of the base of support. skin is very dry but she states she cannot reach the bottom of her foot- showed her a spatula used for diaper cream that should help her reach her foot. Very weak in SLR.    PT Treatment/Interventions  ADLs/Self Care Home Management;Cryotherapy;Electrical Stimulation;Iontophoresis 4mg /ml Dexamethasone;Moist Heat;Ultrasound;Therapeutic activities;Therapeutic exercise;Balance training;Manual techniques;Dry needling;Passive range of motion;Patient/family education;Taping    PT Next Visit Plan  update HEP PRN. , ankle ROM/ strengthening, hip / knee strengthening, gait training, Nu-step, calf stretching, hamstring stretching    PT Home Exercise Plan  heel slides, seated clamshell, LAQ, ankle ABC, towel scrunch, seated calf stretch; supine: SAQ, ankle circles, glut sets    Consulted and Agree with Plan of Care  Patient       Patient will benefit  from skilled therapeutic intervention in order to improve the following deficits and impairments:  Pain, Obesity, Postural dysfunction, Improper body mechanics, Decreased activity tolerance, Abnormal gait, Decreased mobility, Decreased strength, Difficulty walking, Decreased balance, Decreased endurance, Increased edema, Decreased range of motion, Decreased safety awareness  Visit Diagnosis: 1. Chronic pain of left knee   2. Muscle weakness (generalized)   3. Pain in left  ankle and joints of left foot   4. Other abnormalities of gait and mobility        Problem List Patient Active Problem List   Diagnosis Date Noted  . Arthritis 12/18/2018  . Fatigue 07/09/2018  . Gastroesophageal reflux disease without esophagitis 04/16/2018  . Dependent edema 01/01/2017  . Anemia of chronic illness 02/15/2015  . Multiple falls 02/15/2015  . Diabetic neuropathy, type II diabetes mellitus ([place]) 09/09/2012  . Hyperlipidemia associated with type 2 diabetes mellitus ([place]) 05/22/2011  . Hypertension associated with diabetes ([place]) 05/22/2011  . Morbid obesity ([place]) 12/14/2010  . Diabetes mellitus type 2 with complications ([place]) 32/35/5732  . History of pulmonary embolus (PE) 12/14/2010    Amena Dockham C. Julianne Chamberlin PT, DPT 03/06/19 12:37 PM   [facility] [address] [place], [place], [address] Phone: [phone]   Fax:  [phone]  Name: Kayla [name] MRN: [id number] Date of Birth: 08/15/1940

## 2019-03-09 ENCOUNTER — Other Ambulatory Visit: Payer: Self-pay | Admitting: Family Medicine

## 2019-03-09 DIAGNOSIS — I152 Hypertension secondary to endocrine disorders: Secondary | ICD-10-CM

## 2019-03-09 DIAGNOSIS — E1159 Type 2 diabetes mellitus with other circulatory complications: Secondary | ICD-10-CM

## 2019-03-11 ENCOUNTER — Encounter: Payer: Self-pay | Admitting: Physical Therapy

## 2019-03-11 ENCOUNTER — Ambulatory Visit: Payer: Medicare Other | Admitting: Physical Therapy

## 2019-03-11 ENCOUNTER — Other Ambulatory Visit: Payer: Self-pay

## 2019-03-11 DIAGNOSIS — M25572 Pain in left ankle and joints of left foot: Secondary | ICD-10-CM

## 2019-03-11 DIAGNOSIS — R2689 Other abnormalities of gait and mobility: Secondary | ICD-10-CM

## 2019-03-11 DIAGNOSIS — G8929 Other chronic pain: Secondary | ICD-10-CM

## 2019-03-11 DIAGNOSIS — M6281 Muscle weakness (generalized): Secondary | ICD-10-CM

## 2019-03-11 DIAGNOSIS — M25562 Pain in left knee: Secondary | ICD-10-CM | POA: Diagnosis not present

## 2019-03-11 NOTE — Therapy (Signed)
[place], [place], [address] Phone: [phone]   Fax:  [phone]  Physical Therapy Treatment  Patient Details  Name: Kayla [name] MRN: [id number] Date of Birth: [birth year] Referring Provider (PT): Newt Minion, MD    Encounter Date: 03/11/2019  PT End of Session - 03/11/19 1118    Visit Number  5    Number of Visits  17    Date for PT Re-Evaluation  04/17/19    Authorization Type  MCR: Kx mod by 15th visit, progress note at 10th    PT Start Time  1107    PT Stop Time  1145    PT Time Calculation (min)  38 min    Equipment Utilized During Treatment  Gait belt    Activity Tolerance  Patient tolerated treatment well    Behavior During Therapy  [facility] for tasks assessed/performed       Past Medical History:  Diagnosis Date  . Arthritis   . Diabetes mellitus   . Hypertension   . Obesity   . Pulmonary embolus [facility])     Past Surgical History:  Procedure Laterality Date  . ABDOMINAL HYSTERECTOMY    . CHOLECYSTECTOMY    . JOINT REPLACEMENT  Right    There were no vitals filed for this visit.  Subjective Assessment - 03/11/19 1242    Subjective  " I was very sore after the last session and almost did come in because I was so sore"    Patient Stated Goals  be able to function, be able to walk, increase strength    Currently in Pain?  Yes    Pain Score  6     Pain Location  Ankle    Pain Orientation  Left    Pain Descriptors / Indicators  Aching;Sore    Pain Onset  More than a month ago    Pain Frequency  Intermittent    Aggravating Factors   standing/ walking                       [facility] Adult PT Treatment/Exercise - 03/11/19 0001      Knee/Hip Exercises: Standing   Gait Training  with SPC 75 ft cues to practice heel striking/ toe off on the L      Knee/Hip Exercises: Seated   Long Arc Quad  2 sets;15 reps    Heel Slides  10 reps;1 set;Left    Marching  2 sets;20 reps    cues for big movements     Knee/Hip Exercises: Supine   Quad Sets  10 reps;Left      Manual Therapy   Manual Therapy  Joint mobilization    Manual therapy comments  passive ankle inversion    Joint Mobilization  grade II talocrural distarction and medial/ lateral mobs      Ankle Exercises: Seated   Heel Raises  15 reps   with tactile cues for maintain inversion   Toe Raise  15 reps   with tactile cues for maintain inversion            PT Education - 03/11/19 1237    Education Details  addressed pt and her son's questions regarding treatment and her current POC    Person(s) Educated  Patient    Methods  Explanation    Comprehension  Verbalized understanding       PT Short Term Goals - 02/20/19 1343  PT SHORT TERM GOAL #1   Title  pt to be I with inital HEP    Baseline  no previous HEP    Time  4    Period  Weeks    Status  New    Target Date  03/20/19      PT SHORT TERM GOAL #2   Title  pt to verbalize / demo techniques to reduce L ankle/ knee pain via RICE and HEP    Time  4    Period  Weeks    Status  New    Target Date  03/20/19      PT SHORT TERM GOAL #3   Title  pt to be able to ambulate >/= with [facility] for >/= 15 min with </= 2/10 pain for functional mobility functional endurnace progression    Time  4    Period  Weeks    Status  Achieved    Target Date  03/20/19        PT Long Term Goals - 02/20/19 1346      PT LONG TERM GOAL #1   Title  increase L ankle eversion/ PF by >/= 15 degrees to promote proper posture and assist with efficent mechanics    Time  8    Period  Weeks    Status  New    Target Date  04/17/19      PT LONG TERM GOAL #2   Title  pt to be able to stand and walk >/= 30-45 min with SPC with no report of pain or instability for functional endurance required for community ambulation and safety    Time  8    Period  Weeks    Status  New    Target Date  04/17/19      PT LONG TERM GOAL #3   Title  increase LLE strength  grossly to >/= 4/5 in to promote hip/ knee stability and maximize for walking/ standing    Time  8    Period  Weeks    Status  New    Target Date  04/17/19      PT LONG TERM GOAL #4   Title  improve TUG by >/= 10 sec to promote functional mobility    Time  8    Period  Weeks    Status  New    Target Date  04/17/19      PT LONG TERM GOAL #5   Title  increase FOTO score to </= 44% limited to demo improvement in function    Time  8    Period  Weeks    Status  New    Target Date  04/17/19            Plan - 03/11/19 1238    Clinical Impression Statement  pt noted increaesd soreness in the quads since last visit which is likely due to DOMS. Continued working hip / knee strength as well as ankle ROM. pt continues to demonstrate limited ankle mobliity utilizing hip ROM to promote mobility.    PT Treatment/Interventions  ADLs/Self Care Home Management;Cryotherapy;Electrical Stimulation;Iontophoresis 4mg /ml Dexamethasone;Moist Heat;Ultrasound;Therapeutic activities;Therapeutic exercise;Balance training;Manual techniques;Dry needling;Passive range of motion;Patient/family education;Taping    PT Next Visit Plan  update HEP PRN. , ankle ROM/ strengthening, hip / knee strengthening, gait training, Nu-step, calf stretching, hamstring stretching    PT Home Exercise Plan  heel slides, seated clamshell, LAQ, ankle ABC, towel scrunch, seated calf stretch; supine: SAQ, ankle circles, glut sets  Consulted and Agree with Plan of Care  Patient       Patient will benefit from skilled therapeutic intervention in order to improve the following deficits and impairments:  Pain, Obesity, Postural dysfunction, Improper body mechanics, Decreased activity tolerance, Abnormal gait, Decreased mobility, Decreased strength, Difficulty walking, Decreased balance, Decreased endurance, Increased edema, Decreased range of motion, Decreased safety awareness  Visit Diagnosis: 1. Chronic pain of left knee   2. Muscle  weakness (generalized)   3. Pain in left ankle and joints of left foot   4. Other abnormalities of gait and mobility        Problem List Patient Active Problem List   Diagnosis Date Noted  . Arthritis 12/18/2018  . Fatigue 07/09/2018  . Gastroesophageal reflux disease without esophagitis 04/16/2018  . Dependent edema 01/01/2017  . Anemia of chronic illness 02/15/2015  . Multiple falls 02/15/2015  . Diabetic neuropathy, type II diabetes mellitus ([place]) 09/09/2012  . Hyperlipidemia associated with type 2 diabetes mellitus ([place]) 05/22/2011  . Hypertension associated with diabetes ([place]) 05/22/2011  . Morbid obesity ([place]) 12/14/2010  . Diabetes mellitus type 2 with complications ([place]) 15/83/0940  . History of pulmonary embolus (PE) 12/14/2010   Starr Lake PT, DPT, LAT, ATC  03/11/19  12:47 PM      [facility] [address] [place], [place], [address] Phone: [phone]   Fax:  [phone]  Name: Kayla [name] MRN: [id number] Date of Birth: August 18, 1940

## 2019-03-13 ENCOUNTER — Ambulatory Visit: Payer: Medicare Other | Admitting: Physical Therapy

## 2019-03-13 ENCOUNTER — Encounter: Payer: Self-pay | Admitting: Physical Therapy

## 2019-03-13 ENCOUNTER — Other Ambulatory Visit: Payer: Self-pay

## 2019-03-13 DIAGNOSIS — G8929 Other chronic pain: Secondary | ICD-10-CM

## 2019-03-13 DIAGNOSIS — R2689 Other abnormalities of gait and mobility: Secondary | ICD-10-CM

## 2019-03-13 DIAGNOSIS — M25562 Pain in left knee: Secondary | ICD-10-CM | POA: Diagnosis not present

## 2019-03-13 DIAGNOSIS — M25572 Pain in left ankle and joints of left foot: Secondary | ICD-10-CM

## 2019-03-13 DIAGNOSIS — M6281 Muscle weakness (generalized): Secondary | ICD-10-CM

## 2019-03-13 NOTE — Therapy (Signed)
[place], [place], [address] Phone: [phone]   Fax:  [phone]  Physical Therapy Treatment  Patient Details  Name: Kayla [name] MRN: [id number] Date of Birth: June 11, 1941 Referring Provider (PT): Newt Minion, MD    Encounter Date: 03/13/2019  PT End of Session - 03/13/19 1019    Visit Number  6    Number of Visits  17    Date for PT Re-Evaluation  04/17/19    Authorization Type  MCR: Kx mod by 15th visit, progress note at 10th    PT Start Time  1019    PT Stop Time  1059    PT Time Calculation (min)  40 min    Activity Tolerance  Patient tolerated treatment well    Behavior During Therapy  [facility] for tasks assessed/performed       Past Medical History:  Diagnosis Date  . Arthritis   . Diabetes mellitus   . Hypertension   . Obesity   . Pulmonary embolus [facility])     Past Surgical History:  Procedure Laterality Date  . ABDOMINAL HYSTERECTOMY    . CHOLECYSTECTOMY    . JOINT REPLACEMENT  Right    There were no vitals filed for this visit.  Subjective Assessment - 03/13/19 1020    Subjective  "i don't know what was going on but I had increased soreness in my leg last night"    Patient Stated Goals  be able to function, be able to walk, increase strength    Currently in Pain?  Yes    Pain Score  6     Pain Location  Ankle    Pain Orientation  Left    Pain Descriptors / Indicators  Aching;Sore         [facility] PT Assessment - 03/13/19 0001      Assessment   Medical Diagnosis  Venous insufficiency (chronic) (peripheral),  Left ankle joint deformity, Chronic pain of left knee      Referring Provider (PT)  Newt Minion, MD                    [facility] Adult PT Treatment/Exercise - 03/13/19 0001      Knee/Hip Exercises: Aerobic   Nustep  L4 x 6 min UE/LE      Knee/Hip Exercises: Standing   Gait Training  with SPC 75 ft cues to practice heel striking/ toe off on the L   cues  for motivation     Knee/Hip Exercises: Supine   Stage manager;Both    Short Arc [employer]  10 reps;Strengthening;Both;4 sets    [employer] sets;10 reps    Bridges Limitations  with bolster beneath the knees and pt pushing through the knees    Other Supine Knee/Hip Exercises  ankle circles, ankle inversion, toe extension/flx    Other Supine Knee/Hip Exercises  marching 1 x 10 alternating L/ R   starting with bolster beneath knees     Manual Therapy   Manual therapy comments  passive and AAROM ankle inversion    Joint Mobilization  grade II talocrural distarction and medial/ lateral mobs               PT Short Term Goals - 02/20/19 1343      PT SHORT TERM GOAL #1   Title  pt to be I with inital HEP    Baseline  no previous HEP  Time  4    Period  Weeks    Status  New    Target Date  03/20/19      PT SHORT TERM GOAL #2   Title  pt to verbalize / demo techniques to reduce L ankle/ knee pain via RICE and HEP    Time  4    Period  Weeks    Status  New    Target Date  03/20/19      PT SHORT TERM GOAL #3   Title  pt to be able to ambulate >/= with [facility] for >/= 15 min with </= 2/10 pain for functional mobility functional endurnace progression    Time  4    Period  Weeks    Status  Achieved    Target Date  03/20/19        PT Long Term Goals - 02/20/19 1346      PT LONG TERM GOAL #1   Title  increase L ankle eversion/ PF by >/= 15 degrees to promote proper posture and assist with efficent mechanics    Time  8    Period  Weeks    Status  New    Target Date  04/17/19      PT LONG TERM GOAL #2   Title  pt to be able to stand and walk >/= 30-45 min with SPC with no report of pain or instability for functional endurance required for community ambulation and safety    Time  8    Period  Weeks    Status  New    Target Date  04/17/19      PT LONG TERM GOAL #3   Title  increase LLE strength grossly to >/= 4/5 in to promote hip/ knee stability  and maximize for walking/ standing    Time  8    Period  Weeks    Status  New    Target Date  04/17/19      PT LONG TERM GOAL #4   Title  improve TUG by >/= 10 sec to promote functional mobility    Time  8    Period  Weeks    Status  New    Target Date  04/17/19      PT LONG TERM GOAL #5   Title  increase FOTO score to </= 44% limited to demo improvement in function    Time  8    Period  Weeks    Status  New    Target Date  04/17/19            Plan - 03/13/19 1059    Clinical Impression Statement  continued working on ankle moblity and hip/ knee strengthening. She requires frequent verbal cues for motivation for exercises. pt reports she will be running to [employer] tomorrow to get new shoes. end of session she continues to report soreness along the top of the foot.    PT Next Visit Plan  update HEP PRN. , ankle ROM/ strengthening, hip / knee strengthening, gait training, Nu-step, calf stretching, hamstring stretching    PT Home Exercise Plan  heel slides, seated clamshell, LAQ, ankle ABC, towel scrunch, seated calf stretch; supine: SAQ, ankle circles, glut sets       Patient will benefit from skilled therapeutic intervention in order to improve the following deficits and impairments:     Visit Diagnosis: 1. Chronic pain of left knee   2. Muscle weakness (generalized)   3. Pain  in left ankle and joints of left foot   4. Other abnormalities of gait and mobility        Problem List Patient Active Problem List   Diagnosis Date Noted  . Arthritis 12/18/2018  . Fatigue 07/09/2018  . Gastroesophageal reflux disease without esophagitis 04/16/2018  . Dependent edema 01/01/2017  . Anemia of chronic illness 02/15/2015  . Multiple falls 02/15/2015  . Diabetic neuropathy, type II diabetes mellitus ([place]) 09/09/2012  . Hyperlipidemia associated with type 2 diabetes mellitus ([place]) 05/22/2011  . Hypertension associated with diabetes ([place]) 05/22/2011  . Morbid obesity ([place])  12/14/2010  . Diabetes mellitus type 2 with complications ([place]) 67/20/9198  . History of pulmonary embolus (PE) 12/14/2010    Starr Lake PT, DPT, LAT, ATC  03/13/19  11:02 AM      [facility] [address] [place], [place], [address] Phone: [phone]   Fax:  [phone]  Name: PIERRETTE SCHEU MRN: [id number] Date of Birth: 1940-08-30

## 2019-03-18 ENCOUNTER — Ambulatory Visit: Payer: Medicare Other | Admitting: Physical Therapy

## 2019-03-18 ENCOUNTER — Other Ambulatory Visit: Payer: Self-pay

## 2019-03-18 DIAGNOSIS — M6281 Muscle weakness (generalized): Secondary | ICD-10-CM

## 2019-03-18 DIAGNOSIS — M25562 Pain in left knee: Secondary | ICD-10-CM | POA: Diagnosis not present

## 2019-03-18 DIAGNOSIS — G8929 Other chronic pain: Secondary | ICD-10-CM

## 2019-03-18 DIAGNOSIS — M25572 Pain in left ankle and joints of left foot: Secondary | ICD-10-CM

## 2019-03-18 DIAGNOSIS — R2689 Other abnormalities of gait and mobility: Secondary | ICD-10-CM

## 2019-03-18 NOTE — Therapy (Signed)
[place], [place], [address] Phone: [phone]   Fax:  [phone]  Physical Therapy Treatment  Patient Details  Name: Kayla [name] MRN: [id number] Date of Birth: October 20, 1940 Referring Provider (PT): Newt Minion, MD    Encounter Date: 03/18/2019  PT End of Session - 03/18/19 1103    Visit Number  7    Number of Visits  17    Date for PT Re-Evaluation  04/17/19    Authorization Type  MCR: Kx mod by 15th visit, progress note at 10th    PT Start Time  1103    PT Stop Time  1145    PT Time Calculation (min)  42 min    Activity Tolerance  Patient tolerated treatment well    Behavior During Therapy  [facility] for tasks assessed/performed       Past Medical History:  Diagnosis Date  . Arthritis   . Diabetes mellitus   . Hypertension   . Obesity   . Pulmonary embolus [facility])     Past Surgical History:  Procedure Laterality Date  . ABDOMINAL HYSTERECTOMY    . CHOLECYSTECTOMY    . JOINT REPLACEMENT  Right    There were no vitals filed for this visit.  Subjective Assessment - 03/18/19 1109    Subjective  "I had increased soreness in the R ankle and knee. Last night my ankle really started bothering me"    Currently in Pain?  Yes    Pain Score  9     Pain Location  Ankle    Pain Orientation  Left    Pain Descriptors / Indicators  Aching    Pain Type  Chronic pain    Pain Onset  More than a month ago    Pain Frequency  Intermittent    Aggravating Factors   standing/ walking activity                       [facility] Adult PT Treatment/Exercise - 03/18/19 0001      Knee/Hip Exercises: Stretches   Gastroc Stretch  2 reps   with strap hodling 40 sec     Knee/Hip Exercises: Standing   Gait Training  SPC 50 ft before requesting to sit down. pt noted mild reduction in pain in the ankle with arch tape trial      Knee/Hip Exercises: Supine   Quad Sets  2 sets;10 reps   holding 3 seconds    Quad Sets Limitations  used ball between feet to promote hip/ knee internal rotation    Short Arc Quad Sets  2 sets;15 reps;Both    Heel Slides  1 set;10 reps;Both   limited motion on the LLE   Bridges  Strengthening;2 sets;10 reps    Bridges Limitations  with bolster beneath the knees and pt pushing through the knees    Other Supine Knee/Hip Exercises  ankle circles, ankle inversion, toe extension/flx    Other Supine Knee/Hip Exercises  marching 2 x 10 alternating L/ R   starting in hooklying positing with bolster     Manual Therapy   Manual Therapy  Other (comment)    Manual therapy comments  passive and AAROM ankle inversion    Other Manual Therapy  trialed medial arch taping      Ankle Exercises: Seated   Other Seated Ankle Exercises  rocking board (ifit) focusing on inversion 2 x 10 with gentel manual assist for end range  and tactile assist to preven knee from rotating               PT Short Term Goals - 02/20/19 1343      PT SHORT TERM GOAL #1   Title  pt to be I with inital HEP    Baseline  no previous HEP    Time  4    Period  Weeks    Status  New    Target Date  03/20/19      PT SHORT TERM GOAL #2   Title  pt to verbalize / demo techniques to reduce L ankle/ knee pain via RICE and HEP    Time  4    Period  Weeks    Status  New    Target Date  03/20/19      PT SHORT TERM GOAL #3   Title  pt to be able to ambulate >/= with [facility] for >/= 15 min with </= 2/10 pain for functional mobility functional endurnace progression    Time  4    Period  Weeks    Status  Achieved    Target Date  03/20/19        PT Long Term Goals - 02/20/19 1346      PT LONG TERM GOAL #1   Title  increase L ankle eversion/ PF by >/= 15 degrees to promote proper posture and assist with efficent mechanics    Time  8    Period  Weeks    Status  New    Target Date  04/17/19      PT LONG TERM GOAL #2   Title  pt to be able to stand and walk >/= 30-45 min with SPC with no report of  pain or instability for functional endurance required for community ambulation and safety    Time  8    Period  Weeks    Status  New    Target Date  04/17/19      PT LONG TERM GOAL #3   Title  increase LLE strength grossly to >/= 4/5 in to promote hip/ knee stability and maximize for walking/ standing    Time  8    Period  Weeks    Status  New    Target Date  04/17/19      PT LONG TERM GOAL #4   Title  improve TUG by >/= 10 sec to promote functional mobility    Time  8    Period  Weeks    Status  New    Target Date  04/17/19      PT LONG TERM GOAL #5   Title  increase FOTO score to </= 44% limited to demo improvement in function    Time  8    Period  Weeks    Status  New    Target Date  04/17/19            Plan - 03/18/19 1200    Clinical Impression Statement  pt noted increased soreness in the foot last night. continued working on ankle ROM focusing on inversion and trialed arch taping to promote support. she continues to demonstate limited hip/ knee endurance bil with R worse than L. continue working with gait but pt would benefit from progressing from a [facility] to a RW and plan to trial that next session.    PT Next Visit Plan  update HEP PRN. , ankle ROM/ strengthening, hip / knee strengthening, gait  training, Nu-step, calf stretching, hamstring stretching, trial RW    PT Home Exercise Plan  heel slides, seated clamshell, LAQ, ankle ABC, towel scrunch, seated calf stretch; supine: SAQ, ankle circles, glut sets       Patient will benefit from skilled therapeutic intervention in order to improve the following deficits and impairments:  Pain, Obesity, Postural dysfunction, Improper body mechanics, Decreased activity tolerance, Abnormal gait, Decreased mobility, Decreased strength, Difficulty walking, Decreased balance, Decreased endurance, Increased edema, Decreased range of motion, Decreased safety awareness  Visit Diagnosis: 1. Chronic pain of left knee   2. Muscle  weakness (generalized)   3. Pain in left ankle and joints of left foot   4. Other abnormalities of gait and mobility        Problem List Patient Active Problem List   Diagnosis Date Noted  . Arthritis 12/18/2018  . Fatigue 07/09/2018  . Gastroesophageal reflux disease without esophagitis 04/16/2018  . Dependent edema 01/01/2017  . Anemia of chronic illness 02/15/2015  . Multiple falls 02/15/2015  . Diabetic neuropathy, type II diabetes mellitus ([place]) 09/09/2012  . Hyperlipidemia associated with type 2 diabetes mellitus ([place]) 05/22/2011  . Hypertension associated with diabetes ([place]) 05/22/2011  . Morbid obesity ([place]) 12/14/2010  . Diabetes mellitus type 2 with complications ([place]) 31/54/0086  . History of pulmonary embolus (PE) 12/14/2010   Starr Lake PT, DPT, LAT, ATC  03/18/19  12:03 PM      [facility] [address] [place], [place], [address] Phone: [phone]   Fax:  [phone]  Name: Kayla [name] MRN: [id number] Date of Birth: 1940/10/23

## 2019-03-20 ENCOUNTER — Ambulatory Visit: Payer: Medicare Other | Admitting: Physical Therapy

## 2019-03-20 ENCOUNTER — Other Ambulatory Visit: Payer: Self-pay

## 2019-03-20 ENCOUNTER — Encounter: Payer: Self-pay | Admitting: Physical Therapy

## 2019-03-20 DIAGNOSIS — R2689 Other abnormalities of gait and mobility: Secondary | ICD-10-CM

## 2019-03-20 DIAGNOSIS — M25562 Pain in left knee: Secondary | ICD-10-CM | POA: Diagnosis not present

## 2019-03-20 DIAGNOSIS — G8929 Other chronic pain: Secondary | ICD-10-CM

## 2019-03-20 DIAGNOSIS — M6281 Muscle weakness (generalized): Secondary | ICD-10-CM

## 2019-03-20 DIAGNOSIS — M25572 Pain in left ankle and joints of left foot: Secondary | ICD-10-CM

## 2019-03-20 NOTE — Therapy (Signed)
[place], [place], [address] Phone: [phone]   Fax:  [phone]  Physical Therapy Treatment  Patient Details  Name: Kayla [name] MRN: [id number] Date of Birth: [birth year] Referring Provider (PT): Newt Minion, MD    Encounter Date: 03/20/2019  PT End of Session - 03/20/19 1148    Visit Number  8    Number of Visits  17    Date for PT Re-Evaluation  04/17/19    Authorization Type  MCR: Kx mod by 15th visit, progress note at 10th    PT Start Time  1109   pt arrived 9 min late   PT Stop Time  1147    PT Time Calculation (min)  38 min    Equipment Utilized During Treatment  Gait belt    Activity Tolerance  Patient tolerated treatment well    Behavior During Therapy  [facility] for tasks assessed/performed       Past Medical History:  Diagnosis Date  . Arthritis   . Diabetes mellitus   . Hypertension   . Obesity   . Pulmonary embolus [facility])     Past Surgical History:  Procedure Laterality Date  . ABDOMINAL HYSTERECTOMY    . CHOLECYSTECTOMY    . JOINT REPLACEMENT  Right    There were no vitals filed for this visit.  Subjective Assessment - 03/20/19 1112    Subjective  "I was doing pretty good at home until I tried doing bridges then my ankle started to really hurt    Currently in Pain?  Yes    Pain Score  7     Pain Location  Ankle    Pain Orientation  Left    Pain Descriptors / Indicators  Aching;Sore    Pain Score  7    Pain Location  Knee    Pain Orientation  Left    Pain Descriptors / Indicators  Aching;Sore    Pain Type  Chronic pain    Pain Onset  More than a month ago    Pain Frequency  Intermittent                       [facility] Adult PT Treatment/Exercise - 03/20/19 0001      Self-Care   Self-Care  Other Self-Care Comments    Other Self-Care Comments   benefits of using a RW to promote stability/ safety increase,       Knee/Hip Exercises: Standing   Hip  Flexion  Both;Stengthening;2 sets;10 reps   while using RW   Gait Training  pt ambulated 2 x 20 ft, then 1 x 185 ft with RW with intermittent cues to keep walker in good positioning   exhibited increased cadence and stride     Knee/Hip Exercises: Supine   Quad Sets  2 sets;10 reps   with bolster under heels    Short Arc Quad Sets  2 sets;15 reps;Both   tactile cues for proper form   Heel Slides  1 set;10 reps    Bridges  Strengthening;2 sets;15 reps    Bridges Limitations  with bolster beneath the knees and pt pushing through the knees    Other Supine Knee/Hip Exercises  marching 2 x 10 alternating L/ R   starging with legs on bolster     Manual Therapy   Manual therapy comments  passive and AAROM ankle inversion  PT Education - 03/20/19 1145    Education Details  discussed with family benefits of getting a walk for home use    Person(s) Educated  Patient    Methods  Explanation;Handout;Verbal cues    Comprehension  Verbalized understanding;Verbal cues required       PT Short Term Goals - 02/20/19 1343      PT SHORT TERM GOAL #1   Title  pt to be I with inital HEP    Baseline  no previous HEP    Time  4    Period  Weeks    Status  New    Target Date  03/20/19      PT SHORT TERM GOAL #2   Title  pt to verbalize / demo techniques to reduce L ankle/ knee pain via RICE and HEP    Time  4    Period  Weeks    Status  New    Target Date  03/20/19      PT SHORT TERM GOAL #3   Title  pt to be able to ambulate >/= with [facility] for >/= 15 min with </= 2/10 pain for functional mobility functional endurnace progression    Time  4    Period  Weeks    Status  Achieved    Target Date  03/20/19        PT Long Term Goals - 02/20/19 1346      PT LONG TERM GOAL #1   Title  increase L ankle eversion/ PF by >/= 15 degrees to promote proper posture and assist with efficent mechanics    Time  8    Period  Weeks    Status  New    Target Date  04/17/19      PT LONG  TERM GOAL #2   Title  pt to be able to stand and walk >/= 30-45 min with SPC with no report of pain or instability for functional endurance required for community ambulation and safety    Time  8    Period  Weeks    Status  New    Target Date  04/17/19      PT LONG TERM GOAL #3   Title  increase LLE strength grossly to >/= 4/5 in to promote hip/ knee stability and maximize for walking/ standing    Time  8    Period  Weeks    Status  New    Target Date  04/17/19      PT LONG TERM GOAL #4   Title  improve TUG by >/= 10 sec to promote functional mobility    Time  8    Period  Weeks    Status  New    Target Date  04/17/19      PT LONG TERM GOAL #5   Title  increase FOTO score to </= 44% limited to demo improvement in function    Time  8    Period  Weeks    Status  New    Target Date  04/17/19            Plan - 03/20/19 1122    Clinical Impression Statement  continued report of pain fluctuating depending on activity/ positioning. She reports ambulating from room to room at home but demonstrates significant limitation with ambulation in the clinic with use of SPC. practiced using the RW to promote stability and safety. She ambulated much farther with the RW and reported decreased pain in  the ankle. She would highly benefit from getting a RW for safety with ambulation.    PT Treatment/Interventions  ADLs/Self Care Home Management;Cryotherapy;Electrical Stimulation;Iontophoresis 4mg /ml Dexamethasone;Moist Heat;Ultrasound;Therapeutic activities;Therapeutic exercise;Balance training;Manual techniques;Dry needling;Passive range of motion;Patient/family education;Taping    PT Next Visit Plan  update HEP PRN. , ankle ROM/ strengthening, hip / knee strengthening, gait training, Nu-step, calf stretching, hamstring stretching, use RW    PT Home Exercise Plan  heel slides, seated clamshell, LAQ, ankle ABC, towel scrunch, seated calf stretch; supine: SAQ, ankle circles, glut sets    Consulted  and Agree with Plan of Care  Patient       Patient will benefit from skilled therapeutic intervention in order to improve the following deficits and impairments:  Pain, Obesity, Postural dysfunction, Improper body mechanics, Decreased activity tolerance, Abnormal gait, Decreased mobility, Decreased strength, Difficulty walking, Decreased balance, Decreased endurance, Increased edema, Decreased range of motion, Decreased safety awareness  Visit Diagnosis: Chronic pain of left knee  Muscle weakness (generalized)  Pain in left ankle and joints of left foot  Other abnormalities of gait and mobility     Problem List Patient Active Problem List   Diagnosis Date Noted  . Arthritis 12/18/2018  . Fatigue 07/09/2018  . Gastroesophageal reflux disease without esophagitis 04/16/2018  . Dependent edema 01/01/2017  . Anemia of chronic illness 02/15/2015  . Multiple falls 02/15/2015  . Diabetic neuropathy, type II diabetes mellitus ([place]) 09/09/2012  . Hyperlipidemia associated with type 2 diabetes mellitus ([place]) 05/22/2011  . Hypertension associated with diabetes ([place]) 05/22/2011  . Morbid obesity ([place]) 12/14/2010  . Diabetes mellitus type 2 with complications ([place]) 25/95/6387  . History of pulmonary embolus (PE) 12/14/2010   Starr Lake PT, DPT, LAT, ATC  03/20/19  11:49 AM      [place] [facility] [address] [place], [place], [address] Phone: [phone]   Fax:  [phone]  Name: ELIANIS FISCHBACH MRN: [id number] Date of Birth: [birth year]

## 2019-03-24 ENCOUNTER — Ambulatory Visit: Payer: Medicare Other | Admitting: Physical Therapy

## 2019-03-24 ENCOUNTER — Other Ambulatory Visit: Payer: Self-pay

## 2019-03-24 DIAGNOSIS — R2689 Other abnormalities of gait and mobility: Secondary | ICD-10-CM

## 2019-03-24 DIAGNOSIS — G8929 Other chronic pain: Secondary | ICD-10-CM

## 2019-03-24 DIAGNOSIS — M25562 Pain in left knee: Secondary | ICD-10-CM | POA: Diagnosis not present

## 2019-03-24 DIAGNOSIS — M6281 Muscle weakness (generalized): Secondary | ICD-10-CM

## 2019-03-24 DIAGNOSIS — M25572 Pain in left ankle and joints of left foot: Secondary | ICD-10-CM

## 2019-03-24 NOTE — Therapy (Signed)
[place], [place], [address] Phone: [phone]   Fax:  [phone]  Physical Therapy Treatment  Patient Details  Name: Kayla [name] MRN: [id number] Date of Birth: [birth year] Referring Provider (PT): Newt Minion, MD    Encounter Date: 03/24/2019  PT End of Session - 03/24/19 1603    Visit Number  9    Number of Visits  17    Date for PT Re-Evaluation  04/17/19    Authorization Type  MCR: Kx mod by 15th visit, progress note at 10th    PT Start Time  1555    PT Stop Time  1628    PT Time Calculation (min)  33 min    Activity Tolerance  Patient tolerated treatment well    Behavior During Therapy  [facility] for tasks assessed/performed       Past Medical History:  Diagnosis Date  . Arthritis   . Diabetes mellitus   . Hypertension   . Obesity   . Pulmonary embolus [facility])     Past Surgical History:  Procedure Laterality Date  . ABDOMINAL HYSTERECTOMY    . CHOLECYSTECTOMY    . JOINT REPLACEMENT  Right    There were no vitals filed for this visit.  Subjective Assessment - 03/24/19 1604    Subjective  " I am doing better but still have pain in the top of my foot"    Patient Stated Goals  be able to function, be able to walk, increase strength    Currently in Pain?  Yes    Pain Score  7     Pain Orientation  Left    Pain Descriptors / Indicators  Aching;Sore    Pain Type  Chronic pain    Pain Onset  More than a month ago    Pain Frequency  Intermittent                       [facility] Adult PT Treatment/Exercise - 03/24/19 0001      Knee/Hip Exercises: Stretches   Gastroc Stretch  2 reps;30 seconds   with strap     Knee/Hip Exercises: Seated   Long Arc Quad  2 sets;15 reps;Both;Strengthening    Heel Slides  Both;1 set;10 reps    Marching  2 sets;20 reps    Sit to [employer]  5 reps;3 sets   from hi-lo table with table lowered between sets     Ankle Exercises: Seated   Heel Raises   15 reps    Toe Raise  15 reps    Other Seated Ankle Exercises  rocking board (ifit) PF/DF and inversion 2 x 10 with gentel manual assist for end range and tactile assist to preven knee from rotating               PT Short Term Goals - 02/20/19 1343      PT SHORT TERM GOAL #1   Title  pt to be I with inital HEP    Baseline  no previous HEP    Time  4    Period  Weeks    Status  New    Target Date  03/20/19      PT SHORT TERM GOAL #2   Title  pt to verbalize / demo techniques to reduce L ankle/ knee pain via RICE and HEP    Time  4    Period  Weeks    Status  New  Target Date  03/20/19      PT SHORT TERM GOAL #3   Title  pt to be able to ambulate >/= with [facility] for >/= 15 min with </= 2/10 pain for functional mobility functional endurnace progression    Time  4    Period  Weeks    Status  Achieved    Target Date  03/20/19        PT Long Term Goals - 02/20/19 1346      PT LONG TERM GOAL #1   Title  increase L ankle eversion/ PF by >/= 15 degrees to promote proper posture and assist with efficent mechanics    Time  8    Period  Weeks    Status  New    Target Date  04/17/19      PT LONG TERM GOAL #2   Title  pt to be able to stand and walk >/= 30-45 min with SPC with no report of pain or instability for functional endurance required for community ambulation and safety    Time  8    Period  Weeks    Status  New    Target Date  04/17/19      PT LONG TERM GOAL #3   Title  increase LLE strength grossly to >/= 4/5 in to promote hip/ knee stability and maximize for walking/ standing    Time  8    Period  Weeks    Status  New    Target Date  04/17/19      PT LONG TERM GOAL #4   Title  improve TUG by >/= 10 sec to promote functional mobility    Time  8    Period  Weeks    Status  New    Target Date  04/17/19      PT LONG TERM GOAL #5   Title  increase FOTO score to </= 44% limited to demo improvement in function    Time  8    Period  Weeks    Status  New     Target Date  04/17/19            Plan - 03/24/19 1621    Clinical Impression Statement  continued working on ankle ROM and hip/ knee strengthening which she notes relief of pain inthe ankle following end of session. continued use of RW throughout session which she conitnues to do really well with. provided referral from DM for RW to get a RW for home use.    Comorbidities  hx or PE, DM, vascular insuffency, pt lives alone       Patient will benefit from skilled therapeutic intervention in order to improve the following deficits and impairments:  Pain, Obesity, Postural dysfunction, Improper body mechanics, Decreased activity tolerance, Abnormal gait, Decreased mobility, Decreased strength, Difficulty walking, Decreased balance, Decreased endurance, Increased edema, Decreased range of motion, Decreased safety awareness  Visit Diagnosis: Chronic pain of left knee  Muscle weakness (generalized)  Pain in left ankle and joints of left foot  Other abnormalities of gait and mobility     Problem List Patient Active Problem List   Diagnosis Date Noted  . Arthritis 12/18/2018  . Fatigue 07/09/2018  . Gastroesophageal reflux disease without esophagitis 04/16/2018  . Dependent edema 01/01/2017  . Anemia of chronic illness 02/15/2015  . Multiple falls 02/15/2015  . Diabetic neuropathy, type II diabetes mellitus ([place]) 09/09/2012  . Hyperlipidemia associated with type 2 diabetes mellitus ([place]) 05/22/2011  .  Hypertension associated with diabetes ([place]) 05/22/2011  . Morbid obesity ([place]) 12/14/2010  . Diabetes mellitus type 2 with complications ([place]) [id number]  . History of pulmonary embolus (PE) 12/14/2010    Starr Lake PT, DPT, LAT, ATC  03/24/19  4:28 PM      [facility] [address] [place], [place], [address] Phone: [phone]   Fax:  [phone]  Name: Kayla [name] MRN: [id number] Date of Birth:  04-Jul-1941

## 2019-03-27 ENCOUNTER — Telehealth: Payer: Self-pay | Admitting: Physical Therapy

## 2019-03-27 NOTE — Telephone Encounter (Signed)
Returned pt's son's call regarding information need inorder acquire a RW and provide necessary documentation. Spent time discussing about filling out medical release for all previous records. Stated that if they can bring the referral with them to their appointment, I can provide the treatment note for that day and we can fax it to the the location they choose to help streamline this process.

## 2019-04-01 ENCOUNTER — Ambulatory Visit: Payer: Medicare Other | Attending: Orthopedic Surgery | Admitting: Physical Therapy

## 2019-04-01 ENCOUNTER — Other Ambulatory Visit: Payer: Self-pay

## 2019-04-01 ENCOUNTER — Encounter: Payer: Self-pay | Admitting: Physical Therapy

## 2019-04-01 DIAGNOSIS — M25572 Pain in left ankle and joints of left foot: Secondary | ICD-10-CM | POA: Insufficient documentation

## 2019-04-01 DIAGNOSIS — R2689 Other abnormalities of gait and mobility: Secondary | ICD-10-CM | POA: Insufficient documentation

## 2019-04-01 DIAGNOSIS — M6281 Muscle weakness (generalized): Secondary | ICD-10-CM | POA: Diagnosis present

## 2019-04-01 DIAGNOSIS — M25562 Pain in left knee: Secondary | ICD-10-CM | POA: Insufficient documentation

## 2019-04-01 DIAGNOSIS — G8929 Other chronic pain: Secondary | ICD-10-CM | POA: Diagnosis present

## 2019-04-01 NOTE — Therapy (Signed)
[place], [place], [address] Phone: [phone]   Fax:  [phone]  Physical Therapy Treatment Progress Note Reporting Period 02/20/2019 to 04/01/2019  See note below for Objective Data and Assessment of Progress/Goals.       Patient Details  Name: Kayla [name] MRN: [id number] Date of Birth: 05-Apr-1941 Referring Provider (PT): Newt Minion, MD    Encounter Date: 04/01/2019  PT End of Session - 04/01/19 1156    Visit Number  10    Number of Visits  17    Date for PT Re-Evaluation  04/17/19    Authorization Type  MCR: Kx mod by 15th visit, progress note at 20th    PT Start Time  1145    PT Stop Time  1232    PT Time Calculation (min)  47 min    Equipment Utilized During Treatment  Other (comment)   used RW in the clinic   Activity Tolerance  Patient tolerated treatment well    Behavior During Therapy  [facility] for tasks assessed/performed       Past Medical History:  Diagnosis Date  . Arthritis   . Diabetes mellitus   . Hypertension   . Obesity   . Pulmonary embolus [facility])     Past Surgical History:  Procedure Laterality Date  . ABDOMINAL HYSTERECTOMY    . CHOLECYSTECTOMY    . JOINT REPLACEMENT  Right    There were no vitals filed for this visit.  Subjective Assessment - 04/01/19 1157    Subjective  pt arrived to treatment with her newly aqcuired new balance shoes to promote foot stability, and reports pain at 2/10 today. per her son they are currently working on getting her a RW, and would like to get some coverage for the new shoes"    Currently in Pain?  Yes    Pain Score  2     Pain Location  Ankle    Pain Orientation  Left    Pain Descriptors / Indicators  Aching;Sore    Pain Type  Chronic pain    Pain Onset  More than a month ago    Pain Frequency  Intermittent    Aggravating Factors   prolonged standing/ walking    Pain Relieving Factors  icey hot, volataren, Using the RW at  the clinic    Pain Score  2    Pain Location  Knee    Pain Orientation  Left    Pain Descriptors / Indicators  Aching;Sore    Pain Type  Chronic pain    Pain Onset  More than a month ago    Pain Frequency  Intermittent    Aggravating Factors   prolonged standing/ walking    Pain Relieving Factors  moving the ankle around         [facility] PT Assessment - 04/01/19 0001      Assessment   Medical Diagnosis  Venous insufficiency (chronic) (peripheral),  Left ankle joint deformity, Chronic pain of left knee      Referring Provider (PT)  Newt Minion, MD       Observation/Other Assessments   Focus on Therapeutic Outcomes (FOTO)   49% limited      AROM   Left Knee Extension  10    Left Knee Flexion  80   measures in supine   Left Ankle Dorsiflexion  4    Left Ankle Plantar Flexion  38    Left Ankle  Eversion  22   resting position is 14 degrees of eversion      PROM   PROM Assessment Site  Ankle    Right/Left Ankle  Left    Left Ankle Dorsiflexion  6   soreness in the foot   Left Ankle Inversion  10      Strength   Left Knee Flexion  3+/5   in available ROM   Left Knee Extension  3+/5   in available ROM   Left Ankle Dorsiflexion  4/5   in available ROM    Left Ankle Plantar Flexion  4/5   in available ROM    Left Ankle Inversion  4-/5   in available ROM    Left Ankle Eversion  4-/5   in available ROM      Ambulation/Gait   Assistive device  Rolling walker    Gait Pattern  Step-to pattern;Decreased stride length    Gait Comments  significant improvement in safety/ stabiltiy with RW. She demonstrated improvement in step length and stride.       Timed Up and Go Test   Normal TUG (seconds)  55   with RW   TUG Comments  significant improvement in stability and safety with RW   previous test was with [facility] with limited stability/ safety                  [facility] Adult PT Treatment/Exercise - 04/01/19 0001      Therapeutic Activites    Therapeutic Activities   Other Therapeutic Activities    Other Therapeutic Activities  practiced car transfer with RW require frequent verbal cues for form but was able to perform activity with no increase in pain and demonstrates significant improvement in safety.       Knee/Hip Exercises: Stretches   Active Hamstring Stretch  2 reps;30 seconds    Gastroc Stretch  2 reps;30 seconds      Knee/Hip Exercises: Aerobic   Nustep  L3 x 7 min LE only      Knee/Hip Exercises: Standing   Gait Training  pt is able to ambulate with a clinic Rolling walker utiliized.  3 x 100 ft, and utilzied RW ambulating between equipment/ exercises      Knee/Hip Exercises: Seated   Long Arc Quad  2 sets;15 reps   in chair to reduce posterior trunk lean   Heel Slides  1 set;10 reps;Both;Strengthening    Marching  2 sets;20 reps    Sit to [place]  2 sets;5 reps;without UE support   from hi low table lowering between sets     Manual Therapy   Manual therapy comments  passive and AAROM ankle inversion    Joint Mobilization  grade II talocrural distarction and medial/ lateral mobs      Ankle Exercises: Seated   Heel Raises  15 reps    Toe Raise  15 reps               PT Short Term Goals - 04/01/19 1204      PT SHORT TERM GOAL #1   Title  pt to be I with inital HEP    Period  Weeks    Status  Achieved      PT SHORT TERM GOAL #2   Title  pt to verbalize / demo techniques to reduce L ankle/ knee pain via RICE and HEP    Period  Weeks    Status  Achieved  PT SHORT TERM GOAL #3   Title  pt to be able to ambulate >/= with [facility] for >/= 15 min with </= 2/10 pain for functional mobility functional endurnace progression    Baseline  able to ambulate with RW 15 min with 2/10 pain    Status  Achieved        PT Long Term Goals - 04/01/19 1205      PT LONG TERM GOAL #1   Title  increase L ankle in rsion/ PF by >/= 15 degrees to promote proper posture and assist with efficent mechanics    Time  8    Period  Weeks     Status  On-going      PT LONG TERM GOAL #2   Title  pt to be able to stand and walk >/= 30-45 min with RW with no report of pain or instability for functional endurance required for community ambulation and safety    Time  8    Period  Weeks    Status  Revised      PT LONG TERM GOAL #3   Title  increase LLE strength grossly to >/= 4/5 in to promote hip/ knee stability and maximize for walking/ standing    Time  8    Status  On-going      PT LONG TERM GOAL #4   Title  improve TUG by >/= 10 sec to promote functional mobility    Baseline  55 seconds today with RW    Time  8    Period  Weeks    Status  On-going      PT LONG TERM GOAL #5   Title  increase FOTO score to </= 44% limited to demo improvement in function    Baseline  49% limited    Time  8    Period  Weeks    Status  On-going            Plan - 04/01/19 1242    Clinical Impression Statement  Kayla [name] reports significant reduction in ankle pain after getting her new shoes from [employer] rating pain at 2/10 compared to previous treatment at 7/10. Continued use of clinic RW to work with inthe session which she continues to demonstrate significant improvement with stride/ cadence as well as safety. She would benefit from getting a RW for home/ community use to maximize safety and general mobility. continued working on LLE strengthening from the ankle to the hip but does fatigue quickly. she would benefit from continued physical therapy working on LLE strength, maximize mobility/ safety, with current POC.    PT Treatment/Interventions  ADLs/Self Care Home Management;Cryotherapy;Electrical Stimulation;Iontophoresis 4mg /ml Dexamethasone;Moist Heat;Ultrasound;Therapeutic activities;Therapeutic exercise;Balance training;Manual techniques;Dry needling;Passive range of motion;Patient/family education;Taping    Consulted and Agree with Plan of Care  Patient       Patient will benefit from skilled therapeutic intervention in  order to improve the following deficits and impairments:  Pain, Obesity, Postural dysfunction, Improper body mechanics, Decreased activity tolerance, Abnormal gait, Decreased mobility, Decreased strength, Difficulty walking, Decreased balance, Decreased endurance, Increased edema, Decreased range of motion, Decreased safety awareness  Visit Diagnosis: Chronic pain of left knee  Muscle weakness (generalized)  Pain in left ankle and joints of left foot  Other abnormalities of gait and mobility     Problem List Patient Active Problem List   Diagnosis Date Noted  . Arthritis 12/18/2018  . Fatigue 07/09/2018  . Gastroesophageal reflux disease without esophagitis 04/16/2018  .  Dependent edema 01/01/2017  . Anemia of chronic illness 02/15/2015  . Multiple falls 02/15/2015  . Diabetic neuropathy, type II diabetes mellitus ([place]) 09/09/2012  . Hyperlipidemia associated with type 2 diabetes mellitus ([place]) 05/22/2011  . Hypertension associated with diabetes ([place]) 05/22/2011  . Morbid obesity ([place]) 12/14/2010  . Diabetes mellitus type 2 with complications ([place]) [id number]  . History of pulmonary embolus (PE) 12/14/2010   Starr Lake PT, DPT, LAT, ATC  04/01/19  12:49 PM      [facility] [address] [place], [place], [address] Phone: [phone]   Fax:  [phone]  Name: Kayla [name] MRN: [id number] Date of Birth: August 25, 1940

## 2019-04-02 ENCOUNTER — Other Ambulatory Visit: Payer: Self-pay

## 2019-04-02 DIAGNOSIS — M25572 Pain in left ankle and joints of left foot: Secondary | ICD-10-CM

## 2019-04-02 DIAGNOSIS — E1142 Type 2 diabetes mellitus with diabetic polyneuropathy: Secondary | ICD-10-CM

## 2019-04-03 ENCOUNTER — Ambulatory Visit: Payer: Medicare Other | Admitting: Physical Therapy

## 2019-04-03 ENCOUNTER — Telehealth: Payer: Self-pay | Admitting: Physical Therapy

## 2019-04-03 ENCOUNTER — Telehealth: Payer: Self-pay | Admitting: Orthopedic Surgery

## 2019-04-03 NOTE — Telephone Encounter (Signed)
Patient's son called wanting to know if PT Kristopher Leamon sent over a recommendation for the patient to get some walking shoes.  He stated that the patient needed a RX from Dr. Sharol Given.  The son would like for you to give him a call back.  CB#[phone].  Thank you.

## 2019-04-03 NOTE — Telephone Encounter (Signed)
Return patients call. She stated she was having increased pain in the ankle, and was asking why Dr. Sharol Given wrote a referral to the podiatrist.   Answered pt questions and discussed her benefits regarding their referral.

## 2019-04-04 ENCOUNTER — Telehealth: Payer: Self-pay | Admitting: Orthopedic Surgery

## 2019-04-04 NOTE — Telephone Encounter (Signed)
Patient's son [name] called asked if patient can get a Rx for the compression stockings? [name] asked if the Rx can reference these purchase dates 12/18/2018 and 02/10/2019  [name] asked if he can get two copies of the Rx mailed to him?  Please also see previous notes.   The number to contact [name] is [phone]

## 2019-04-04 NOTE — Telephone Encounter (Signed)
Patient's son [name] called. Would like a RX for walking shoes. He would like a copy of the RX that will be sent to [place] at [facility], for him to send to her insurance. His call back number is [phone].

## 2019-04-08 ENCOUNTER — Other Ambulatory Visit: Payer: Self-pay

## 2019-04-08 ENCOUNTER — Encounter: Payer: Self-pay | Admitting: Physical Therapy

## 2019-04-08 ENCOUNTER — Other Ambulatory Visit: Payer: Self-pay | Admitting: Family Medicine

## 2019-04-08 ENCOUNTER — Ambulatory Visit: Payer: Medicare Other | Admitting: Physical Therapy

## 2019-04-08 DIAGNOSIS — I152 Hypertension secondary to endocrine disorders: Secondary | ICD-10-CM

## 2019-04-08 DIAGNOSIS — M25562 Pain in left knee: Secondary | ICD-10-CM

## 2019-04-08 DIAGNOSIS — M25572 Pain in left ankle and joints of left foot: Secondary | ICD-10-CM

## 2019-04-08 DIAGNOSIS — G8929 Other chronic pain: Secondary | ICD-10-CM

## 2019-04-08 DIAGNOSIS — E1159 Type 2 diabetes mellitus with other circulatory complications: Secondary | ICD-10-CM

## 2019-04-08 DIAGNOSIS — R2689 Other abnormalities of gait and mobility: Secondary | ICD-10-CM

## 2019-04-08 DIAGNOSIS — M6281 Muscle weakness (generalized): Secondary | ICD-10-CM

## 2019-04-08 NOTE — Therapy (Signed)
[place], [place], [address] Phone: [phone]   Fax:  [phone]  Physical Therapy Treatment  Patient Details  Name: Kayla [name] MRN: [id number] Date of Birth: November 26, 1940 Referring Provider (PT): Newt Minion, MD    Encounter Date: 04/08/2019  PT End of Session - 04/08/19 1016    Visit Number  11    Number of Visits  17    Date for PT Re-Evaluation  04/17/19    Authorization Type  MCR: Kx mod by 15th visit, progress note at 20th    PT Start Time  1016    PT Stop Time  1101    PT Time Calculation (min)  45 min    Activity Tolerance  Patient tolerated treatment well    Behavior During Therapy  [facility] for tasks assessed/performed       Past Medical History:  Diagnosis Date  . Arthritis   . Diabetes mellitus   . Hypertension   . Obesity   . Pulmonary embolus [facility])     Past Surgical History:  Procedure Laterality Date  . ABDOMINAL HYSTERECTOMY    . CHOLECYSTECTOMY    . JOINT REPLACEMENT  Right    There were no vitals filed for this visit.  Subjective Assessment - 04/08/19 1044    Subjective  "those new shoes really hurt my feet, I couldn't move with them on. I did get my walker and it is helping out alot"    Currently in Pain?  Yes    Pain Score  8     Pain Location  Leg    Pain Orientation  Left    Pain Descriptors / Indicators  Aching         [facility] PT Assessment - 04/08/19 0001      Assessment   Medical Diagnosis  Venous insufficiency (chronic) (peripheral),  Left ankle joint deformity, Chronic pain of left knee      Referring Provider (PT)  Newt Minion, MD                    [facility] Adult PT Treatment/Exercise - 04/08/19 0001      Self-Care   Other Self-Care Comments   benefits of adjusting the RW and using tennis balls to assist with it rolling,       Knee/Hip Exercises: Seated   Long Arc Quad  2 sets;15 reps;Left   tactile cues for proper form   Heel  Slides  1 set;10 reps;Both;Strengthening    Marching  2 sets;20 reps    Sit to Sand  1 set;5 reps      Ankle Exercises: Seated   Heel Raises  10 reps;Both   x 2 sets    Toe Raise  10 reps   x 2 sets    Other Seated Ankle Exercises  rocking board (ifit) PF/DF and inversion 2 x 10 with gentel manual assist for end range and tactile assist to preven knee from rotating   tactile cues for proper form            PT Education - 04/08/19 1038    Education Details  adjusted walker to appropriate height and switch the front legs so the wheels were located on the outside to promote better base/ stability. To give the new shoes she got so she can get used to them and that some soreness is normal and it may take some time to get used to be is still  benefitting her.    Person(s) Educated  Patient    Methods  Explanation;Verbal cues    Comprehension  Verbal cues required       PT Short Term Goals - 04/01/19 1204      PT SHORT TERM GOAL #1   Title  pt to be I with inital HEP    Period  Weeks    Status  Achieved      PT SHORT TERM GOAL #2   Title  pt to verbalize / demo techniques to reduce L ankle/ knee pain via RICE and HEP    Period  Weeks    Status  Achieved      PT SHORT TERM GOAL #3   Title  pt to be able to ambulate >/= with [facility] for >/= 15 min with </= 2/10 pain for functional mobility functional endurnace progression    Baseline  able to ambulate with RW 15 min with 2/10 pain    Status  Achieved        PT Long Term Goals - 04/01/19 1205      PT LONG TERM GOAL #1   Title  increase L ankle in rsion/ PF by >/= 15 degrees to promote proper posture and assist with efficent mechanics    Time  8    Period  Weeks    Status  On-going      PT LONG TERM GOAL #2   Title  pt to be able to stand and walk >/= 30-45 min with RW with no report of pain or instability for functional endurance required for community ambulation and safety    Time  8    Period  Weeks    Status  Revised       PT LONG TERM GOAL #3   Title  increase LLE strength grossly to >/= 4/5 in to promote hip/ knee stability and maximize for walking/ standing    Time  8    Status  On-going      PT LONG TERM GOAL #4   Title  improve TUG by >/= 10 sec to promote functional mobility    Baseline  55 seconds today with RW    Time  8    Period  Weeks    Status  On-going      PT LONG TERM GOAL #5   Title  increase FOTO score to </= 44% limited to demo improvement in function    Baseline  49% limited    Time  8    Period  Weeks    Status  On-going            Plan - 04/08/19 1042    Clinical Impression Statement  pt arrived with her new RW but has regressed to using the slippers versus the shoes she got at [employer] due to reporting that she was more sore with them. continued working hip/ knee and ankle strengthening with emphasis on ankle ROM. discussed at length importance of using her new shoes and that she needs to get used to them giving it atleast a 3-4 week trial period. end of session she reported pain dropped to 4-5/10.    PT Next Visit Plan  update HEP PRN. , ankle ROM/ strengthening, hip / knee strengthening, gait training, Nu-step, calf stretching, hamstring stretching, use RW    PT Home Exercise Plan  heel slides, seated clamshell, LAQ, ankle ABC, towel scrunch, seated calf stretch; supine: SAQ, ankle circles, glut sets  Consulted and Agree with Plan of Care  Patient       Patient will benefit from skilled therapeutic intervention in order to improve the following deficits and impairments:     Visit Diagnosis: Chronic pain of left knee  Muscle weakness (generalized)  Pain in left ankle and joints of left foot  Other abnormalities of gait and mobility     Problem List Patient Active Problem List   Diagnosis Date Noted  . Arthritis 12/18/2018  . Fatigue 07/09/2018  . Gastroesophageal reflux disease without esophagitis 04/16/2018  . Dependent edema 01/01/2017  . Anemia  of chronic illness 02/15/2015  . Multiple falls 02/15/2015  . Diabetic neuropathy, type II diabetes mellitus ([place]) 09/09/2012  . Hyperlipidemia associated with type 2 diabetes mellitus ([place]) 05/22/2011  . Hypertension associated with diabetes ([place]) 05/22/2011  . Morbid obesity ([place]) 12/14/2010  . Diabetes mellitus type 2 with complications ([place]) [id number]  . History of pulmonary embolus (PE) 12/14/2010   Starr Lake PT, DPT, LAT, ATC  04/08/19  11:05 AM      [facility] [address] [place], [place], [address] Phone: [phone]   Fax:  [phone]  Name: Kayla [name] MRN: [id number] Date of Birth: [birth year]

## 2019-04-09 NOTE — Telephone Encounter (Signed)
This is a duplicate message. Can you please call the pt's son and find out what it is he is wanting [place] to do for her and I will sign off on the other two messages that have been sent. Thank you.

## 2019-04-10 ENCOUNTER — Ambulatory Visit: Payer: Medicare Other | Admitting: Physical Therapy

## 2019-04-10 NOTE — Telephone Encounter (Signed)
Patient's son [name] was called and informed that he can pick up patient's office notes from 12/16/2018 and Rx for walking shoes and compression socks up front when available. Patient's son discussed that Medicare denied the Rx for compression socks but he wants to file an appeal and get this in before 11/2019. All documents are copied for patient and will be signed off by physician at pickup. Patient's son wants reimbursement for these items.

## 2019-04-15 ENCOUNTER — Ambulatory Visit: Payer: Medicare Other | Admitting: Physical Therapy

## 2019-04-17 ENCOUNTER — Ambulatory Visit: Payer: Medicare Other | Admitting: Physical Therapy

## 2019-04-21 ENCOUNTER — Ambulatory Visit (INDEPENDENT_AMBULATORY_CARE_PROVIDER_SITE_OTHER): Payer: Medicare Other | Admitting: Family Medicine

## 2019-04-21 ENCOUNTER — Encounter: Payer: Self-pay | Admitting: Family Medicine

## 2019-04-21 ENCOUNTER — Other Ambulatory Visit: Payer: Self-pay

## 2019-04-21 VITALS — BP 132/78 | HR 99 | Wt 247.4 lb

## 2019-04-21 DIAGNOSIS — E1159 Type 2 diabetes mellitus with other circulatory complications: Secondary | ICD-10-CM

## 2019-04-21 DIAGNOSIS — E1169 Type 2 diabetes mellitus with other specified complication: Secondary | ICD-10-CM | POA: Diagnosis not present

## 2019-04-21 DIAGNOSIS — E118 Type 2 diabetes mellitus with unspecified complications: Secondary | ICD-10-CM

## 2019-04-21 DIAGNOSIS — E1140 Type 2 diabetes mellitus with diabetic neuropathy, unspecified: Secondary | ICD-10-CM

## 2019-04-21 DIAGNOSIS — K219 Gastro-esophageal reflux disease without esophagitis: Secondary | ICD-10-CM

## 2019-04-21 DIAGNOSIS — I152 Hypertension secondary to endocrine disorders: Secondary | ICD-10-CM

## 2019-04-21 DIAGNOSIS — I10 Essential (primary) hypertension: Secondary | ICD-10-CM

## 2019-04-21 DIAGNOSIS — E785 Hyperlipidemia, unspecified: Secondary | ICD-10-CM

## 2019-04-21 NOTE — Progress Notes (Signed)
  Subjective:    Patient ID: Kayla [name], female    DOB: 11/18/1940, 78 y.o.   MRN: [id number]  Kayla [name] is a 78 y.o. female who presents for follow-up of Type 2 diabetes mellitus.  Patient is checking home blood sugars.   Home blood sugar records: meter record How often is blood sugars being checked: Fasting 90-1 40  current symptoms/problems include none at this time  Daily foot checks: yes   Any foot concerns: None  last eye exam:01/23/18 Exercise: Physical therapy twice per week She admits to gaining weight due to her lack of physical activity.  She is involved in physical therapy to help with this.  Also her son gave her an ointment to use on her feet which has helped with the pain.  She is not sure exactly what this medication is.  She continues on metformin and is having no GI symptoms from that.  She is also taking losartan/HCTZ and Coreg without any difficulty.  She is taking atorvastatin and having no muscle aches or pains.  She is not on a PPI stating she is having no difficulty with GI symptoms.  The following portions of the patient's history were reviewed and updated as appropriate: allergies, current medications, past medical history, past social history and problem list.  ROS as in subjective above.     Objective:    Physical Exam Alert and in no distress otherwise not examined.  Hemoglobin A1c is 6.8 Lab Review Diabetic Labs Latest Ref Rng & Units 12/18/2018 07/09/2018 04/16/2018 01/28/2018 01/01/2017  HbA1c 4.0 - 5.6 % 6.6(A) - 6.7(A) 6.5(A) 6.5  Chol 100 - 199 mg/dL 272(H) - - - -  HDL >39 mg/dL 59 - - - -  Calc LDL 0 - 99 mg/dL 194(H) - - - -  Triglycerides 0 - 149 mg/dL 93 - - - -  Creatinine 0.57 - 1.00 mg/dL - 0.85 - - -   BP/Weight 04/21/2019 01/20/2019 01/13/2019 12/18/2018 0000000  Systolic BP [id number] - - 123456 -  Diastolic BP 78 - - 86 -  Wt. (Lbs) 247.4 240 240 240.6 236  BMI 42.47 41.2 41.2 41.3 40.51   Foot/eye exam completion dates Latest Ref Rng & Units  12/18/2018 08/19/2018  Eye Exam No Retinopathy - -  Foot Form Completion - Done Done    Kayla [name]  reports that she has never smoked. She has never used smokeless tobacco. She reports that she does not drink alcohol or use drugs.     Assessment & Plan:    Hyperlipidemia associated with type 2 diabetes mellitus ([place]) - Plan: Comprehensive metabolic panel, Lipid panel  Diabetes mellitus type 2 with complications ([place])  Hypertension associated with diabetes ([place])  Type 2 diabetes mellitus with diabetic neuropathy, unspecified whether long term insulin use ([place])  Gastroesophageal reflux disease without esophagitis   1. Rx changes: none 2. Education: Reviewed 'ABCs' of diabetes management (respective goals in parentheses):  A1C (<7), blood pressure (<130/80), and cholesterol (LDL <100). 3. Compliance at present is estimated to be fair. Efforts to improve compliance (if necessary) will be directed at increased exercise.  Her physical activity is quite limited due to the ankle problem but I expressed my interest in her getting more physically active and making dietary changes. 4. Follow up: 4 months Flu shot was offered however she would like to wait. I also encouraged her to get the TDaP at the drugstore.

## 2019-04-22 LAB — LIPID PANEL
Chol/HDL Ratio: 3.5 ratio (ref 0.0–4.4)
Cholesterol, Total: 177 mg/dL (ref 100–199)
HDL: 51 mg/dL (ref >39)
LDL Chol Calc (NIH): 108 mg/dL — ABNORMAL HIGH (ref 0–99)
Triglycerides: 96 mg/dL (ref 0–149)
VLDL Cholesterol Cal: 18 mg/dL (ref 5–40)

## 2019-04-22 LAB — COMPREHENSIVE METABOLIC PANEL
ALT: 13 IU/L (ref 0–32)
AST: 14 IU/L (ref 0–40)
Albumin/Globulin Ratio: 1.4 (ref 1.2–2.2)
Albumin: 4.1 g/dL (ref 3.7–4.7)
Alkaline Phosphatase: 109 IU/L (ref 39–117)
BUN/Creatinine Ratio: 27 (ref 12–28)
BUN: 24 mg/dL (ref 8–27)
Bilirubin Total: <0.2 mg/dL (ref 0.0–1.2)
CO2: 24 mmol/L (ref 20–29)
Calcium: 9.3 mg/dL (ref 8.7–10.3)
Chloride: 105 mmol/L (ref 96–106)
Creatinine, Ser: 0.9 mg/dL (ref 0.57–1.00)
GFR calc Af Amer: 70 mL/min/{1.73_m2} (ref >59)
GFR calc non Af Amer: 61 mL/min/{1.73_m2} (ref >59)
Globulin, Total: 3 g/dL (ref 1.5–4.5)
Glucose: 101 mg/dL — ABNORMAL HIGH (ref 65–99)
Potassium: 4.8 mmol/L (ref 3.5–5.2)
Sodium: 145 mmol/L — ABNORMAL HIGH (ref 134–144)
Total Protein: 7.1 g/dL (ref 6.0–8.5)

## 2019-04-23 ENCOUNTER — Other Ambulatory Visit: Payer: Self-pay | Admitting: Family Medicine

## 2019-04-26 ENCOUNTER — Other Ambulatory Visit: Payer: Self-pay | Admitting: Family Medicine

## 2019-04-26 DIAGNOSIS — E1140 Type 2 diabetes mellitus with diabetic neuropathy, unspecified: Secondary | ICD-10-CM

## 2019-05-07 ENCOUNTER — Ambulatory Visit: Payer: Medicare Other | Attending: Orthopedic Surgery | Admitting: Physical Therapy

## 2019-05-07 ENCOUNTER — Encounter: Payer: Self-pay | Admitting: Physical Therapy

## 2019-05-07 ENCOUNTER — Other Ambulatory Visit: Payer: Self-pay

## 2019-05-07 DIAGNOSIS — M6281 Muscle weakness (generalized): Secondary | ICD-10-CM | POA: Diagnosis present

## 2019-05-07 DIAGNOSIS — M25572 Pain in left ankle and joints of left foot: Secondary | ICD-10-CM | POA: Diagnosis present

## 2019-05-07 DIAGNOSIS — R2689 Other abnormalities of gait and mobility: Secondary | ICD-10-CM | POA: Diagnosis present

## 2019-05-07 DIAGNOSIS — M25562 Pain in left knee: Secondary | ICD-10-CM | POA: Insufficient documentation

## 2019-05-07 DIAGNOSIS — G8929 Other chronic pain: Secondary | ICD-10-CM | POA: Diagnosis present

## 2019-05-07 NOTE — Therapy (Signed)
[place], [place], [address] Phone: [phone]   Fax:  [phone]  Physical Therapy Treatment / Re-certification  Patient Details  Name: Kayla [name] MRN: [id number] Date of Birth: 02/02/1941 Referring Provider (PT): Newt Minion, MD    Encounter Date: 05/07/2019  PT End of Session - 05/07/19 1636    Visit Number  12    Number of Visits  17    Date for PT Re-Evaluation  06/06/19    Authorization Type  MCR: Kx mod by 15th visit, progress note at 20th    PT Start Time  1632    PT Stop Time  1718    PT Time Calculation (min)  46 min    Activity Tolerance  Patient tolerated treatment well    Behavior During Therapy  [facility] for tasks assessed/performed       Past Medical History:  Diagnosis Date  . Arthritis   . Diabetes mellitus   . Hypertension   . Obesity   . Pulmonary embolus [facility])     Past Surgical History:  Procedure Laterality Date  . ABDOMINAL HYSTERECTOMY    . CHOLECYSTECTOMY    . JOINT REPLACEMENT  Right    There were no vitals filed for this visit.  Subjective Assessment - 05/07/19 1636    Subjective  " I have no pain today , the walker has been helping. I haven't been wearing the shoes as much becuase they are hard to put on"    Patient Stated Goals  be able to function, be able to walk, increase strength    Pain Score  0-No pain   at worst 9/10   Pain Location  Ankle    Pain Orientation  Left    Pain Descriptors / Indicators  Aching         [facility] PT Assessment - 05/07/19 0001      Assessment   Medical Diagnosis  Venous insufficiency (chronic) (peripheral),  Left ankle joint deformity, Chronic pain of left knee      Referring Provider (PT)  Newt Minion, MD       AROM   Left Knee Extension  0    Left Knee Flexion  80   required cues for knee flexion    Left Ankle Dorsiflexion  8    Left Ankle Plantar Flexion  36    Left Ankle Inversion  -12      PROM   Left  Ankle Inversion  -8      Strength   Left Ankle Dorsiflexion  4+/5    Left Ankle Plantar Flexion  4/5    Left Ankle Inversion  3-/5    Left Ankle Eversion  3-/5      Ambulation/Gait   Assistive device  Rolling walker    Gait Pattern  Step-through pattern;Decreased stride length;Trendelenburg;Antalgic    Ambulation Surface  Level      6 Minute Walk- Baseline   6 Minute Walk- Baseline  yes      6 Minute walk- Post Test   6 Minute Walk Post Test  yes      6 minute walk test results    Aerobic Endurance Distance Walked  325   with RW                  [facility] Adult PT Treatment/Exercise - 05/07/19 0001      Knee/Hip Exercises: Aerobic   Nustep  L3 x 7 min LE only  Knee/Hip Exercises: Seated   Marching  2 sets;10 reps    Sit to Sand  1 set;10 reps      Ankle Exercises: Seated   Towel Inversion/Eversion  --   inversion 2 x 10 with ball between knees and ankles   Heel Raises  Both;10 reps;Right;Left    Toe Raise  10 reps      Ankle Exercises: Stretches   Slant Board Stretch  2 reps;30 seconds             PT Education - 05/07/19 1731    Education Details  Reviewed HEP and updated today. Discussed benefits of wearing her shoes versus house slippers to promote ankle stability. discussed POC    Person(s) Educated  Patient    Methods  Explanation;Verbal cues;Handout    Comprehension  Verbalized understanding;Verbal cues required       PT Short Term Goals - 04/01/19 1204      PT SHORT TERM GOAL #1   Title  pt to be I with inital HEP    Period  Weeks    Status  Achieved      PT SHORT TERM GOAL #2   Title  pt to verbalize / demo techniques to reduce L ankle/ knee pain via RICE and HEP    Period  Weeks    Status  Achieved      PT SHORT TERM GOAL #3   Title  pt to be able to ambulate >/= with [facility] for >/= 15 min with </= 2/10 pain for functional mobility functional endurnace progression    Baseline  able to ambulate with RW 15 min with 2/10 pain     Status  Achieved        PT Long Term Goals - 04/01/19 1205      PT LONG TERM GOAL #1   Title  increase L ankle in rsion/ PF by >/= 15 degrees to promote proper posture and assist with efficent mechanics    Time  8    Period  Weeks    Status  On-going      PT LONG TERM GOAL #2   Title  pt to be able to stand and walk >/= 30-45 min with RW with no report of pain or instability for functional endurance required for community ambulation and safety    Time  8    Period  Weeks    Status  Revised      PT LONG TERM GOAL #3   Title  increase LLE strength grossly to >/= 4/5 in to promote hip/ knee stability and maximize for walking/ standing    Time  8    Status  On-going      PT LONG TERM GOAL #4   Title  improve TUG by >/= 10 sec to promote functional mobility    Baseline  55 seconds today with RW    Time  8    Period  Weeks    Status  On-going      PT LONG TERM GOAL #5   Title  increase FOTO score to </= 44% limited to demo improvement in function    Baseline  49% limited    Time  8    Period  Weeks    Status  On-going            Plan - 05/07/19 1731    Clinical Impression Statement  pt arrived reporting no pain and that the RW has really helped, and she  had her shoes in a bag and was currently wearing her house slippers which she stated she wasn't able to put them on. Assisted pt with donning her shoes which she was able to put her shoes with minimal assistance. Continued working on bil LE strengthening which she does require cues for proper form. She was able to do a 6 min walk test and amb 325 ft with her RW. plan to see pt 1 x a week for the next 4 weeks to work on functional strength, mobility/ endurance and build upon her HEP.    PT Frequency  1x / week    PT Duration  4 weeks    PT Treatment/Interventions  ADLs/Self Care Home Management;Cryotherapy;Electrical Stimulation;Iontophoresis 4mg /ml Dexamethasone;Moist Heat;Ultrasound;Therapeutic activities;Therapeutic  exercise;Balance training;Manual techniques;Dry needling;Passive range of motion;Patient/family education;Taping    PT Next Visit Plan  update HEP PRN. , ankle ROM/ strengthening, hip / knee strengthening, gait training, Nu-step, calf stretching, hamstring stretching, use RW    PT Home Exercise Plan  heel slides, seated clamshell, LAQ, ankle ABC, towel scrunch, seated calf stretch; supine: SAQ, ankle circles, glut sets    Consulted and Agree with Plan of Care  Patient       Patient will benefit from skilled therapeutic intervention in order to improve the following deficits and impairments:  Pain, Obesity, Postural dysfunction, Improper body mechanics, Decreased activity tolerance, Abnormal gait, Decreased mobility, Decreased strength, Difficulty walking, Decreased balance, Decreased endurance, Increased edema, Decreased range of motion, Decreased safety awareness  Visit Diagnosis: Chronic pain of left knee  Muscle weakness (generalized)  Pain in left ankle and joints of left foot  Other abnormalities of gait and mobility     Problem List Patient Active Problem List   Diagnosis Date Noted  . Arthritis 12/18/2018  . Fatigue 07/09/2018  . Gastroesophageal reflux disease without esophagitis 04/16/2018  . Dependent edema 01/01/2017  . Anemia of chronic illness 02/15/2015  . Multiple falls 02/15/2015  . Diabetic neuropathy, type II diabetes mellitus ([place]) 09/09/2012  . Hyperlipidemia associated with type 2 diabetes mellitus ([place]) 05/22/2011  . Hypertension associated with diabetes ([place]) 05/22/2011  . Morbid obesity ([place]) 12/14/2010  . Diabetes mellitus type 2 with complications ([place]) [id number]  . History of pulmonary embolus (PE) 12/14/2010   Starr Lake PT, DPT, LAT, ATC  05/07/19  5:40 PM      [facility] [address] [place], [place], [address] Phone: [phone]   Fax:  [phone]  Name: ELEA KRAFFT MRN:  [id number] Date of Birth: 19-Feb-1941

## 2019-05-09 ENCOUNTER — Ambulatory Visit (INDEPENDENT_AMBULATORY_CARE_PROVIDER_SITE_OTHER): Payer: Medicare Other | Admitting: Podiatry

## 2019-05-09 ENCOUNTER — Encounter: Payer: Self-pay | Admitting: Podiatry

## 2019-05-09 ENCOUNTER — Other Ambulatory Visit: Payer: Self-pay

## 2019-05-09 DIAGNOSIS — L84 Corns and callosities: Secondary | ICD-10-CM

## 2019-05-09 DIAGNOSIS — M79675 Pain in left toe(s): Secondary | ICD-10-CM | POA: Diagnosis not present

## 2019-05-09 DIAGNOSIS — E118 Type 2 diabetes mellitus with unspecified complications: Secondary | ICD-10-CM

## 2019-05-09 DIAGNOSIS — M79674 Pain in right toe(s): Secondary | ICD-10-CM

## 2019-05-09 DIAGNOSIS — L853 Xerosis cutis: Secondary | ICD-10-CM

## 2019-05-09 DIAGNOSIS — B351 Tinea unguium: Secondary | ICD-10-CM

## 2019-05-09 MED ORDER — AMMONIUM LACTATE 12 % EX LOTN: 1.0000 "application " | TOPICAL_LOTION | CUTANEOUS | 5 refills | Status: DC | PRN

## 2019-05-09 NOTE — Patient Instructions (Addendum)
Get heel protectors and vaseline to both heels daily   Corns and Calluses Corns are small areas of thickened skin that occur on the top, sides, or tip of a toe. They contain a cone-shaped core with a point that can press on a nerve below. This causes pain.  Calluses are areas of thickened skin that can occur anywhere on the body, including the hands, fingers, palms, soles of the feet, and heels. Calluses are usually larger than corns. What are the causes? Corns and calluses are caused by rubbing (friction) or pressure, such as from shoes that are too tight or do not fit properly. What increases the risk? Corns are more likely to develop in people who have misshapen toes (toe deformities), such as hammer toes. Calluses can occur with friction to any area of the skin. They are more likely to develop in people who:  Work with their hands.  Wear shoes that fit poorly, are too tight, or are high-heeled.  Have toe deformities. What are the signs or symptoms? Symptoms of a corn or callus include:  A hard growth on the skin.  Pain or tenderness under the skin.  Redness and swelling.  Increased discomfort while wearing tight-fitting shoes, if your feet are affected. If a corn or callus becomes infected, symptoms may include:  Redness and swelling that gets worse.  Pain.  Fluid, blood, or pus draining from the corn or callus. How is this diagnosed? Corns and calluses may be diagnosed based on your symptoms, your medical history, and a physical exam. How is this treated? Treatment for corns and calluses may include:  Removing the cause of the friction or pressure. This may involve: ? Changing your shoes. ? Wearing shoe inserts (orthotics) or other protective layers in your shoes, such as a corn pad. ? Wearing gloves.  Applying medicine to the skin (topical medicine) to help soften skin in the hardened, thickened areas.  Removing layers of dead skin with a file to reduce the size of  the corn or callus.  Removing the corn or callus with a scalpel or laser.  Taking antibiotic medicines, if your corn or callus is infected.  Having surgery, if a toe deformity is the cause. Follow these instructions at home:   Take over-the-counter and prescription medicines only as told by your health care provider.  If you were prescribed an antibiotic, take it as told by your health care provider. Do not stop taking it even if your condition starts to improve.  Wear shoes that fit well. Avoid wearing high-heeled shoes and shoes that are too tight or too loose.  Wear any padding, protective layers, gloves, or orthotics as told by your health care provider.  Soak your hands or feet and then use a file or pumice stone to soften your corn or callus. Do this as told by your health care provider.  Check your corn or callus every day for symptoms of infection. Contact a health care provider if you:  Notice that your symptoms do not improve with treatment.  Have redness or swelling that gets worse.  Notice that your corn or callus becomes painful.  Have fluid, blood, or pus coming from your corn or callus.  Have new symptoms. Summary  Corns are small areas of thickened skin that occur on the top, sides, or tip of a toe.  Calluses are areas of thickened skin that can occur anywhere on the body, including the hands, fingers, palms, and soles of the feet. Calluses are  usually larger than corns.  Corns and calluses are caused by rubbing (friction) or pressure, such as from shoes that are too tight or do not fit properly.  Treatment may include wearing any padding, protective layers, gloves, or orthotics as told by your health care provider. This information is not intended to replace advice given to you by your health care provider. Make sure you discuss any questions you have with your health care provider. Document Released: 04/22/2004 Document Revised: 11/06/2018 Document  Reviewed: 05/30/2017 Elsevier Patient Education  [place]? An infection that lies within the keratin of your nail plate that is caused by a fungus.  WHY ME? Fungal infections affect all ages, sexes, races, and creeds.  There may be many factors that predispose you to a fungal infection such as age, coexisting medical conditions such as diabetes, or an autoimmune disease; stress, medications, fatigue, genetics, etc.  Bottom line: fungus thrives in a warm, moist environment and your shoes offer such a location.  IS IT CONTAGIOUS? Theoretically, yes.  You do not want to share shoes, nail clippers or files with someone who has fungal toenails.  Walking around barefoot in the same room or sleeping in the same bed is unlikely to transfer the organism.  It is important to realize, however, that fungus can spread easily from one nail to the next on the same foot.  HOW DO WE TREAT THIS?  There are several ways to treat this condition.  Treatment may depend on many factors such as age, medications, pregnancy, liver and kidney conditions, etc.  It is best to ask your doctor which options are available to you.  1. No treatment.   Unlike many other medical concerns, you can live with this condition.  However for many people this can be a painful condition and may lead to ingrown toenails or a bacterial infection.  It is recommended that you keep the nails cut short to help reduce the amount of fungal nail. 2. Topical treatment.  These range from herbal remedies to prescription strength nail lacquers.  About 40-50% effective, topicals require twice daily application for approximately 9 to 12 months or until an entirely new nail has grown out.  The most effective topicals are medical grade medications available through physicians offices. 3. Oral antifungal medications.  With an 80-90% cure rate, the most common oral medication requires 3 to 4 months of therapy  and stays in your system for a year as the new nail grows out.  Oral antifungal medications do require blood work to make sure it is a safe drug for you.  A liver function panel will be performed prior to starting the medication and after the first month of treatment.  It is important to have the blood work performed to avoid any harmful side effects.  In general, this medication safe but blood work is required. 4. Laser Therapy.  This treatment is performed by applying a specialized laser to the affected nail plate.  This therapy is noninvasive, fast, and non-painful.  It is not covered by insurance and is therefore, out of pocket.  The results have been very good with a 80-95% cure rate.  [facility] is the only practice in the area to offer this therapy. 5. Permanent Nail Avulsion.  Removing the entire nail so that a new nail will not grow back.

## 2019-05-11 NOTE — Progress Notes (Signed)
Subjective: Kayla [name] is a 78 y.o. y.o. female who presents for preventative diabetic foot care on today with cc of painful calluses b/l heels and mycotic toenails b/l which interfere with daily activities. Pain is aggravated when wearing enclosed shoe gear and relieved with periodic professional debridement.  She relates heel pain b/l heels. She is wheelchair bound. States her sons take turns assisting her with her feet.  She does admit to sleeping on her back nightly.  Current Outpatient Medications on File Prior to Visit  Medication Sig Dispense Refill  . amoxicillin (AMOXIL) 875 MG tablet Take 1 tablet (875 mg total) by mouth 2 (two) times daily. (Patient not taking: Reported on 04/21/2019) 20 tablet 0  . aspirin 325 MG EC tablet USE PER THE INSTRUCTIONS ON PACKAGE LABEL 125 tablet 4  . atorvastatin (LIPITOR) 80 MG tablet TAKE 1 TABLET BY MOUTH DAILY AT 6PM 90 tablet 1  . carvedilol (COREG) 3.125 MG tablet TAKE 1 TABLET BY MOUTH TWICE A DAY 180 tablet 3  . diclofenac (CATAFLAM) 50 MG tablet Take 50 mg by mouth 2 (two) times daily.    . diclofenac (VOLTAREN) 50 MG EC tablet Take 1 tablet (50 mg total) by mouth 2 (two) times daily. (Patient not taking: Reported on 04/21/2019) 60 tablet 1  . diclofenac sodium (VOLTAREN) 1 % GEL Apply 2 g topically 4 (four) times daily.    [name] glucose blood (ONETOUCH VERIO) test strip Use as instructed 100 each 12  . IRON, FERROUS GLUCONATE, PO Take by mouth.    . Lancets (ONETOUCH DELICA PLUS [id number]) [place]     . losartan-hydrochlorothiazide (HYZAAR) 100-12.5 MG tablet Take 1 tablet by mouth daily. 90 tablet 3  . meloxicam (MOBIC) 7.5 MG tablet TAKE 1 TABLET BY MOUTH EVERY DAY (Patient not taking: Reported on 04/21/2019) 30 tablet 0  . metFORMIN (GLUCOPHAGE) 500 MG tablet TAKE 1 TABLET BY MOUTH TWICE A DAY WITH MEALS 180 tablet 0  . methocarbamol (ROBAXIN) 500 MG tablet Take 1 tablet (500 mg total) by mouth 2 (two) times daily. (Patient not taking: Reported  on 04/21/2019) 20 tablet 0  . Multiple Vitamins-Minerals (MULTIVITAMIN WITH MINERALS) tablet Take 1 tablet by mouth daily.    . pantoprazole (PROTONIX) 20 MG tablet TAKE 1 TABLET BY MOUTH EVERY DAY (Patient not taking: Reported on 04/21/2019) 90 tablet 0  . TRIAMCINOLONE ACETONIDE, TOP, 0.05 % OINT Apply to rough skin on heels twice daily (Patient not taking: Reported on 04/21/2019) 430 g 1   No current facility-administered medications on file prior to visit.     No Known Allergies  Objective: Vascular Examination: Capillary refill time immediate x 10 digits.  Dorsalis pedis pulses 1/4 b/l.  Posterior tibial pulses diminished b/l.  No digital hair b/l.  Skin temperature gradient WNL b/l.   Edema BLE left>right. No pain with calf compression. No increased warmth.   Dermatological Examination: Plantar aspect of both feet with moderate amount of dry, flaky skin.   Toenails 1-5 b/l discolored, thick, dystrophic with subungual debris and pain with palpation to nailbeds due to thickness of nails.  Hyperkeratotic lesions b/l heels. No erythema, no edema, no drainage, no flocculence noted.   Musculoskeletal: Muscle strength 5/5 to all LE muscle groups b/l.  Left knee valgus. Pes planovalgus left foot.  Neurological: Sensation intact 5/5 b/l with 10 gram monofilament.  Vibratory sensation intact b/l.  Assessment: 1. Painful onychomycosis toenails 1-5 b/l 2.   Callus b/l heels 3.   NIDDM  Plan:  1. Continue diabetic foot care principles. Literature dispensed on today. 2. Toenails 1-5 b/l were debrided in length and girth without iatrogenic bleeding. 3. Hyperkeratotic lesion(s) pared b/l heels with sterile scalpel blade. Light pinpoint bleeding right heel addressed with Lumicain Hemostatic Solution. Alcohol and band-aid applied. She was advised to remove band-aid on tomorrow. Call if she has any problems. She noted relief of pain symptoms after today's treatment. 4. Recommended  she use heel protectors. She did not want to purchase from our office, but will have her niece purchase a pair for her. She was advised to use nightly when in bed. She related understanding. 5. For xerosis, prescription for LacHydrin 12% Lotion was written.  Patient is to apply to both feet twice a day avoiding application in between the toes.Patient to continue soft, supportive shoe gear daily. 6. Patient to report any pedal injuries to medical professional immediately. 7. Follow up 9 weeks.  8. Patient/POA to call should there be a concern in the interim.

## 2019-05-22 ENCOUNTER — Ambulatory Visit: Payer: Medicare Other | Admitting: Physical Therapy

## 2019-05-26 ENCOUNTER — Other Ambulatory Visit: Payer: Self-pay

## 2019-05-26 ENCOUNTER — Ambulatory Visit: Payer: Medicare Other | Admitting: Physical Therapy

## 2019-05-26 ENCOUNTER — Encounter: Payer: Self-pay | Admitting: Physical Therapy

## 2019-05-26 DIAGNOSIS — M25562 Pain in left knee: Secondary | ICD-10-CM | POA: Diagnosis not present

## 2019-05-26 DIAGNOSIS — M25572 Pain in left ankle and joints of left foot: Secondary | ICD-10-CM

## 2019-05-26 DIAGNOSIS — R2689 Other abnormalities of gait and mobility: Secondary | ICD-10-CM

## 2019-05-26 DIAGNOSIS — G8929 Other chronic pain: Secondary | ICD-10-CM

## 2019-05-26 DIAGNOSIS — M6281 Muscle weakness (generalized): Secondary | ICD-10-CM

## 2019-05-26 NOTE — Therapy (Signed)
[place], [place], [address] Phone: [phone]   Fax:  [phone]  Physical Therapy Treatment  Patient Details  Name: Kayla [name] MRN: [id number] Date of Birth: 19-Sep-1940 Referring Provider (PT): Newt Minion, MD    Encounter Date: 05/26/2019  PT End of Session - 05/26/19 1632    Visit Number  13    Number of Visits  17    Date for PT Re-Evaluation  06/06/19    Authorization Type  MCR: Kx mod by 15th visit, progress note at 20th    PT Start Time  1633    PT Stop Time  1715    PT Time Calculation (min)  42 min    Activity Tolerance  Patient tolerated treatment well    Behavior During Therapy  [facility] for tasks assessed/performed       Past Medical History:  Diagnosis Date  . Arthritis   . Diabetes mellitus   . Hypertension   . Obesity   . Pulmonary embolus [facility])     Past Surgical History:  Procedure Laterality Date  . ABDOMINAL HYSTERECTOMY    . CHOLECYSTECTOMY    . JOINT REPLACEMENT  Right    There were no vitals filed for this visit.  Subjective Assessment - 05/26/19 1639    Subjective  " I have been using my shoes and RW, I am doing pretty good. I did get some soreness in the L foot which I hadn't had in a while, I took some tylenol to get it to go away"    Currently in Pain?  Yes    Pain Score  2     Pain Orientation  Left    Pain Descriptors / Indicators  Aching;Sore    Pain Type  Chronic pain    Pain Onset  More than a month ago    Pain Frequency  Intermittent    Aggravating Factors   prolonged walking/ standing    Effect of Pain on Daily Activities  limited, standing/ walkng         [facility] PT Assessment - 05/26/19 0001      Assessment   Medical Diagnosis  Venous insufficiency (chronic) (peripheral),  Left ankle joint deformity, Chronic pain of left knee      Referring Provider (PT)  Newt Minion, MD                    [facility] Adult PT Treatment/Exercise -  05/26/19 0001      Therapeutic Activites    Therapeutic Activities  Other Therapeutic Activities    Other Therapeutic Activities  getting into the vehicle using RW to promote functional mobility       Knee/Hip Exercises: Stretches   Gastroc Stretch  2 reps;30 seconds   with strap     Knee/Hip Exercises: Aerobic   Nustep  L4 x 6 min UE/LE       Knee/Hip Exercises: Standing   Hip Flexion  2 sets;10 reps   tapping foot on 6 in step with use of RW     Knee/Hip Exercises: Seated   Long Arc Quad  2 sets;15 reps    Marching  2 sets;10 reps    Marching Limitations  using hurdle up and over to work on getting into / out of car               PT Short Term Goals - 04/01/19 1204      PT  SHORT TERM GOAL #1   Title  pt to be I with inital HEP    Period  Weeks    Status  Achieved      PT SHORT TERM GOAL #2   Title  pt to verbalize / demo techniques to reduce L ankle/ knee pain via RICE and HEP    Period  Weeks    Status  Achieved      PT SHORT TERM GOAL #3   Title  pt to be able to ambulate >/= with [facility] for >/= 15 min with </= 2/10 pain for functional mobility functional endurnace progression    Baseline  able to ambulate with RW 15 min with 2/10 pain    Status  Achieved        PT Long Term Goals - 04/01/19 1205      PT LONG TERM GOAL #1   Title  increase L ankle in rsion/ PF by >/= 15 degrees to promote proper posture and assist with efficent mechanics    Time  8    Period  Weeks    Status  On-going      PT LONG TERM GOAL #2   Title  pt to be able to stand and walk >/= 30-45 min with RW with no report of pain or instability for functional endurance required for community ambulation and safety    Time  8    Period  Weeks    Status  Revised      PT LONG TERM GOAL #3   Title  increase LLE strength grossly to >/= 4/5 in to promote hip/ knee stability and maximize for walking/ standing    Time  8    Status  On-going      PT LONG TERM GOAL #4   Title  improve TUG  by >/= 10 sec to promote functional mobility    Baseline  55 seconds today with RW    Time  8    Period  Weeks    Status  On-going      PT LONG TERM GOAL #5   Title  increase FOTO score to </= 44% limited to demo improvement in function    Baseline  49% limited    Time  8    Period  Weeks    Status  On-going            Plan - 05/26/19 1722    Clinical Impression Statement  pt reports overall she has been doing well but did have an intermittent episode of calf pain earlier today. focused on calf stretching, aerobic endurnace and hip flexor strengthening due to pts report of difficulty getting into/out of her car. she has limited hip flexion and relies on posterior trunk lean compensation. she does require intermittent cues on proper RW use and avoid pushing it off to the side to get into the car or sit down.    PT Next Visit Plan  update HEP PRN. review all HEP, pt may benefit from trying use of a leg lifter    PT Home Exercise Plan  heel slides, seated clamshell, LAQ, ankle ABC, towel scrunch, seated calf stretch; supine: SAQ, ankle circles, glut sets    Consulted and Agree with Plan of Care  Patient       Patient will benefit from skilled therapeutic intervention in order to improve the following deficits and impairments:  Pain, Obesity, Postural dysfunction, Improper body mechanics, Decreased activity tolerance, Abnormal gait, Decreased mobility, Decreased  strength, Difficulty walking, Decreased balance, Decreased endurance, Increased edema, Decreased range of motion, Decreased safety awareness  Visit Diagnosis: Chronic pain of left knee  Muscle weakness (generalized)  Pain in left ankle and joints of left foot  Other abnormalities of gait and mobility     Problem List Patient Active Problem List   Diagnosis Date Noted  . Arthritis 12/18/2018  . Fatigue 07/09/2018  . Gastroesophageal reflux disease without esophagitis 04/16/2018  . Dependent edema 01/01/2017  .  Anemia of chronic illness 02/15/2015  . Multiple falls 02/15/2015  . Diabetic neuropathy, type II diabetes mellitus ([place]) 09/09/2012  . Hyperlipidemia associated with type 2 diabetes mellitus ([place]) 05/22/2011  . Hypertension associated with diabetes ([place]) 05/22/2011  . Morbid obesity ([place]) 12/14/2010  . Diabetes mellitus type 2 with complications ([place]) [id number]  . History of pulmonary embolus ([place]) 12/14/2010   Starr Lake PT, DPT, LAT, ATC  05/26/19  5:27 PM      [facility] [address] [place], [place], [address] Phone: [phone]   Fax:  [phone]  Name: Kayla [name] MRN: [id number] Date of Birth: 1940/10/09

## 2019-06-05 ENCOUNTER — Ambulatory Visit: Payer: Medicare Other | Attending: Orthopedic Surgery | Admitting: Physical Therapy

## 2019-06-05 ENCOUNTER — Encounter: Payer: Self-pay | Admitting: Physical Therapy

## 2019-06-05 ENCOUNTER — Other Ambulatory Visit: Payer: Self-pay

## 2019-06-05 DIAGNOSIS — R2689 Other abnormalities of gait and mobility: Secondary | ICD-10-CM | POA: Diagnosis present

## 2019-06-05 DIAGNOSIS — M6281 Muscle weakness (generalized): Secondary | ICD-10-CM

## 2019-06-05 DIAGNOSIS — M25572 Pain in left ankle and joints of left foot: Secondary | ICD-10-CM | POA: Diagnosis present

## 2019-06-05 DIAGNOSIS — G8929 Other chronic pain: Secondary | ICD-10-CM | POA: Diagnosis present

## 2019-06-05 DIAGNOSIS — M25562 Pain in left knee: Secondary | ICD-10-CM | POA: Insufficient documentation

## 2019-06-05 NOTE — Therapy (Signed)
[place], [place], [address] Phone: [phone]   Fax:  [phone]  Physical Therapy Treatment / Discharge  Patient Details  Name: Kayla [name] MRN: [id number] Date of Birth: 17-Mar-1941 Referring Provider (PT): Newt Minion, MD    Encounter Date: 06/05/2019  PT End of Session - 06/05/19 1633    Visit Number  14    Number of Visits  17    Date for PT Re-Evaluation  06/06/19    Authorization Type  MCR: Kx mod by 15th visit, progress note at 20th    PT Start Time  1630    PT Stop Time  1708    PT Time Calculation (min)  38 min    Activity Tolerance  Patient tolerated treatment well    Behavior During Therapy  [facility] for tasks assessed/performed       Past Medical History:  Diagnosis Date  . Arthritis   . Diabetes mellitus   . Hypertension   . Obesity   . Pulmonary embolus [facility])     Past Surgical History:  Procedure Laterality Date  . ABDOMINAL HYSTERECTOMY    . CHOLECYSTECTOMY    . JOINT REPLACEMENT  Right    There were no vitals filed for this visit.  Subjective Assessment - 06/05/19 1636    Subjective  "I have some pains now and again, but overall I think it is much better than it was"    Currently in Pain?  Yes         [facility] PT Assessment - 06/05/19 0001      Strength   Right Knee Flexion  4+/5    Right Knee Extension  4+/5    Left Knee Flexion  4/5   in available ROM    Left Knee Extension  4/5   in available ROM    Left Ankle Dorsiflexion  4+/5    Left Ankle Plantar Flexion  4+/5    Left Ankle Inversion  3-/5    Left Ankle Eversion  3/5      6 minute walk test results    Aerobic Endurance Distance Walked  110   pt requested to do 2 min walk test today     Timed Up and Go Test   Normal TUG (seconds)  54   with RW                  [facility] Adult PT Treatment/Exercise - 06/05/19 0001      Therapeutic Activites    Other Therapeutic Activities  getting into  the vehicle using RW to promote functional mobility using strap as a " leg lifter"      Knee/Hip Exercises: Stretches   Gastroc Stretch  2 reps;30 seconds   with strap     Knee/Hip Exercises: Seated   Marching  2 sets;10 reps    Marching Limitations  using make shift "leg lifter" to practice getting into and out of the car             PT Education - 06/05/19 1715    Education Details  reviewed all HEP and discussed importance of these exercises being "forever exercises". provided handout for leg lifter and discussed benefits to assist with mobility.    Person(s) Educated  Patient    Methods  Explanation;Verbal cues    Comprehension  Verbal cues required       PT Short Term Goals - 04/01/19 1204      PT  SHORT TERM GOAL #1   Title  pt to be I with inital HEP    Period  Weeks    Status  Achieved      PT SHORT TERM GOAL #2   Title  pt to verbalize / demo techniques to reduce L ankle/ knee pain via RICE and HEP    Period  Weeks    Status  Achieved      PT SHORT TERM GOAL #3   Title  pt to be able to ambulate >/= with [facility] for >/= 15 min with </= 2/10 pain for functional mobility functional endurnace progression    Baseline  able to ambulate with RW 15 min with 2/10 pain    Status  Achieved        PT Long Term Goals - 06/05/19 1641      PT LONG TERM GOAL #1   Title  increase L ankle in rsion/ PF by >/= 15 degrees to promote proper posture and assist with efficent mechanics    Period  Weeks    Status  Partially Met      PT LONG TERM GOAL #2   Title  pt to be able to stand and walk >/= 30-45 min with RW with no report of pain or instability for functional endurance required for community ambulation and safety    Baseline  6-10 min at max    Period  Weeks    Status  Not Met      PT LONG TERM GOAL #3   Title  increase LLE strength grossly to >/= 4/5 in to promote hip/ knee stability and maximize for walking/ standing    Period  Weeks    Status  Partially Met       PT LONG TERM GOAL #4   Title  improve TUG by >/= 10 sec to promote functional mobility    Baseline  54 seconds today with RW    Period  Weeks    Status  Not Met      PT LONG TERM GOAL #5   Title  increase FOTO score to </= 44% limited to demo improvement in function    Baseline  72% limited    Period  Weeks    Status  Not Met            Plan - 06/05/19 1712    Clinical Impression Statement  pt reports she is pleased due to having dereased pain and notes improvement in mobility since she had gotten a RW and with the shoes. FOTO reassessment has regressed to 72% limited and she made no functional progress toward her goals. TUG measures 54 seconds with RW, and 2 min walk test was 110 ft which pt felt fatigued. practice using a stretch strap as a leg lifter to assist with getting in and out of the car safely. Based on limited functional progress as well as no measureable progress toward her goals plan to discharge at this time, which pt agreed.    PT Treatment/Interventions  ADLs/Self Care Home Management;Cryotherapy;Electrical Stimulation;Iontophoresis 34m/ml Dexamethasone;Moist Heat;Ultrasound;Therapeutic activities;Therapeutic exercise;Balance training;Manual techniques;Dry needling;Passive range of motion;Patient/family education;Taping    PT Next Visit Plan  d/c    PT Home Exercise Plan  heel slides, seated clamshell, LAQ, ankle ABC, towel scrunch, seated calf stretch; supine: SAQ, ankle circles, glut sets    Consulted and Agree with Plan of Care  Patient       Patient will benefit from skilled therapeutic intervention in order  to improve the following deficits and impairments:  Pain, Obesity, Postural dysfunction, Improper body mechanics, Decreased activity tolerance, Abnormal gait, Decreased mobility, Decreased strength, Difficulty walking, Decreased balance, Decreased endurance, Increased edema, Decreased range of motion, Decreased safety awareness  Visit Diagnosis: Chronic pain of  left knee  Muscle weakness (generalized)  Pain in left ankle and joints of left foot  Other abnormalities of gait and mobility     Problem List Patient Active Problem List   Diagnosis Date Noted  . Arthritis 12/18/2018  . Fatigue 07/09/2018  . Gastroesophageal reflux disease without esophagitis 04/16/2018  . Dependent edema 01/01/2017  . Anemia of chronic illness 02/15/2015  . Multiple falls 02/15/2015  . Diabetic neuropathy, type II diabetes mellitus ([place]) 09/09/2012  . Hyperlipidemia associated with type 2 diabetes mellitus ([place]) 05/22/2011  . Hypertension associated with diabetes ([place]) 05/22/2011  . Morbid obesity ([place]) 12/14/2010  . Diabetes mellitus type 2 with complications ([place]) 96/05/6434  . History of pulmonary embolus ([place]) 12/14/2010    [place] 06/05/2019, 5:23 PM  [facility] [address] [place], [place], [address] Phone: [phone]   Fax:  [phone]  Name: Kayla [name] MRN: [id number] Date of Birth: 01/23/1941       PHYSICAL THERAPY DISCHARGE SUMMARY  Visits from Start of Care: 14  Current functional level related to goals / functional outcomes: See goals, FOTO 72% limited    Remaining deficits: Limited walking/ standing endurance, intermittent ankle/knee, limited ankle inversion.  Pt noted improvement with  Supportive shoe and RW.    Education / Equipment: HEP, theraband, gait, DME education Plan: Patient agrees to discharge.  Patient goals were not met. Patient is being discharged due to meeting the stated rehab goals.  ?????          Felicha Frayne PT, DPT, LAT, ATC  06/05/19  5:29 PM

## 2019-06-12 ENCOUNTER — Ambulatory Visit: Payer: Medicare Other | Admitting: Physical Therapy

## 2019-07-22 ENCOUNTER — Ambulatory Visit: Payer: Medicare Other | Admitting: Podiatry

## 2019-08-11 ENCOUNTER — Ambulatory Visit: Payer: Medicare PPO | Admitting: Podiatry

## 2019-08-11 ENCOUNTER — Other Ambulatory Visit: Payer: Self-pay

## 2019-08-11 ENCOUNTER — Ambulatory Visit: Payer: Medicare PPO

## 2019-08-11 ENCOUNTER — Encounter: Payer: Self-pay | Admitting: Podiatry

## 2019-08-11 DIAGNOSIS — M19072 Primary osteoarthritis, left ankle and foot: Secondary | ICD-10-CM

## 2019-08-11 DIAGNOSIS — M779 Enthesopathy, unspecified: Secondary | ICD-10-CM

## 2019-08-11 DIAGNOSIS — E118 Type 2 diabetes mellitus with unspecified complications: Secondary | ICD-10-CM

## 2019-08-11 DIAGNOSIS — M21072 Valgus deformity, not elsewhere classified, left ankle: Secondary | ICD-10-CM | POA: Diagnosis not present

## 2019-08-11 MED ORDER — DICLOFENAC SODIUM 1 % EX GEL: 2.0000 g | Freq: Four times a day (QID) | CUTANEOUS | 2 refills | Status: DC

## 2019-08-15 ENCOUNTER — Encounter: Payer: Self-pay | Admitting: Podiatry

## 2019-08-15 ENCOUNTER — Ambulatory Visit: Payer: Medicare PPO | Admitting: Podiatry

## 2019-08-15 ENCOUNTER — Other Ambulatory Visit: Payer: Self-pay

## 2019-08-15 DIAGNOSIS — M79676 Pain in unspecified toe(s): Secondary | ICD-10-CM

## 2019-08-15 DIAGNOSIS — E119 Type 2 diabetes mellitus without complications: Secondary | ICD-10-CM

## 2019-08-15 DIAGNOSIS — L84 Corns and callosities: Secondary | ICD-10-CM

## 2019-08-15 DIAGNOSIS — B351 Tinea unguium: Secondary | ICD-10-CM

## 2019-08-15 NOTE — Progress Notes (Signed)
Subjective: Kayla [name] presents today for follow up of painful mycotic nails b/l that are difficult to trim. Pain interferes with ambulation. Aggravating factors include wearing enclosed shoe gear. Pain is relieved with periodic professional debridement., painful calluses and preventative diabetic foot care.   No Known Allergies   Objective: There were no vitals filed for this visit.  Vascular Examination:  Faintly palpable DP pulses, faintly palpable PT pulses, capillary refill time to digits immediate b/l, pedal hair absent and skin temperature gradient WNL b/l.  Chronic pedal edema noted b/l right>left.  Dermatological Examination: no open wounds bilaterally, no interdigital macerations bilaterally and pedal skin noted to be dry and flaky onychomycosis of the toenails, dystrophic nails, discolored, thickened, subungual debris and pain to palpation of nail beds  Hyperkeratotic lesions b/l heels with tenderness to palpation. No edema, no erythema, no drainage, no flocculence.  Musculoskeletal: normal muscle strength 5/5 to all lower extremity muscle groups bilaterally, no pain crepitus or joint limitation noted with ROM, pes planus deformity noted and noted left genu valgum   Neurological: sensation intact 5/5 intact bilaterally with 10g monofilament and vibratory sensation intact  Assessment: 1. Pain due to onychomycosis of toenail   2. Callus   3. Controlled type 2 diabetes mellitus without complication, without long-term current use of insulin (HCC)     Plan: -Toenails 1-5 b/l were debrided in length and girth without iatrogenic bleeding Calluses pared b/l heels utilizing sterile scalpel blade without incident. Advised she consider heel protectors.   -Patient to continue soft, supportive shoe gear daily -Patient to report any pedal injuries to medical professional immediately -Continue diabetic shoes daily -Patient to report any pedal injuries to medical professional  immediately. -Patient/POA to call should there be question/concern in the interim. -Follow up 3 months.

## 2019-08-18 NOTE — Progress Notes (Signed)
Subjective: 79 year old female presents the office today for concerns of left ankle pain.  She states that this is been ongoing for quite some time she previously has been seen for this.  She had x-rays performed last year she did not get new x-rays today.  She denies any recent injury.  She does state she gets some swelling she denies any redness or warmth.  She previously had an ankle brace made for her but she does not wear it due to the heel.  She feels at times her right foot has an odor but denies any open sores. Denies any systemic complaints such as fevers, chills, nausea, vomiting. No acute changes since last appointment, and no other complaints at this time.   Objective: AAO x3, NAD DP/PT pulses palpable bilaterally, CRT less than 3 seconds There is flatfoot present.  Majority discomfort in the anterior ankle joint line.  Mild discomfort in joint range of motion.  Mild edema but there is no erythema or warmth.  No area pinpoint tenderness.  Flexor, extensor tendons appear to be intact. On the right foot am not able to appreciate any odor there is no open lesions.  No interdigital maceration. No open lesions or pre-ulcerative lesions.  No pain with calf compression, swelling, warmth, erythema  Assessment: Left ankle arthritis, flatfoot  Plan: -All treatment options discussed with the patient including all alternatives, risks, complications.  -I try to put her into a Tri-Lock ankle brace today but she had this previously.  Ultimately she had a brace made for her but she does not wear it because of the heel.  I will have her follow-up with Liliane Channel to see if he can modify this to make this more comfortable for her. -Patient encouraged to call the office with any questions, concerns, change in symptoms.   Trula Slade DPM

## 2019-08-21 ENCOUNTER — Encounter: Payer: Self-pay | Admitting: Family Medicine

## 2019-08-21 ENCOUNTER — Other Ambulatory Visit: Payer: Self-pay

## 2019-08-21 ENCOUNTER — Ambulatory Visit: Payer: Medicare PPO | Admitting: Family Medicine

## 2019-08-21 VITALS — BP 140/76 | HR 82 | Temp 97.7°F | Ht 63.5 in | Wt 244.8 lb

## 2019-08-21 DIAGNOSIS — I10 Essential (primary) hypertension: Secondary | ICD-10-CM | POA: Diagnosis not present

## 2019-08-21 DIAGNOSIS — I152 Hypertension secondary to endocrine disorders: Secondary | ICD-10-CM

## 2019-08-21 DIAGNOSIS — E1169 Type 2 diabetes mellitus with other specified complication: Secondary | ICD-10-CM | POA: Diagnosis not present

## 2019-08-21 DIAGNOSIS — E1159 Type 2 diabetes mellitus with other circulatory complications: Secondary | ICD-10-CM | POA: Diagnosis not present

## 2019-08-21 DIAGNOSIS — E118 Type 2 diabetes mellitus with unspecified complications: Secondary | ICD-10-CM

## 2019-08-21 DIAGNOSIS — E785 Hyperlipidemia, unspecified: Secondary | ICD-10-CM

## 2019-08-21 LAB — POCT GLYCOSYLATED HEMOGLOBIN (HGB A1C): Hemoglobin A1C: 6.5 % — AB (ref 4.0–5.6)

## 2019-08-21 NOTE — Progress Notes (Signed)
  Subjective:    Patient ID: Kayla [name], female    DOB: [birth year], 79 y.o.   MRN: [id number]  Kayla [name] is a 79 y.o. female who presents for follow-up of Type 2 diabetes mellitus.  Home blood sugar records: meter records 98- 150 fasting  Current symptoms/problems include none at this time. Daily foot checks: yes   Any foot concerns: none  Exercise: Pt for about a month Diet: healthy diet She just finished physical therapy for her knee.  She is very sedentary.  She is not sure when she has her next eye exam set up.  She continues on Metformin and is having no GI symptoms with that.  Atorvastatin is causing no trouble with myalgias.  She continues on Coreg as well as losartan/HCTZ. The following portions of the patient's history were reviewed and updated as appropriate: allergies, current medications, past medical history, past social history and problem list.  ROS as in subjective above.     Objective:    Physical Exam Alert and in no distress otherwise not examined.  Hemoglobin A1c is 6.5 Lab Review Diabetic Labs Latest Ref Rng & Units 08/21/2019 04/21/2019 12/18/2018 07/09/2018 04/16/2018  HbA1c 4.0 - 5.6 % 6.5(A) - 6.6(A) - 6.7(A)  Chol 100 - 199 mg/dL - 177 272(H) - -  HDL >39 mg/dL - 51 59 - -  Calc LDL 0 - 99 mg/dL - 108(H) 194(H) - -  Triglycerides 0 - 149 mg/dL - 96 93 - -  Creatinine 0.57 - 1.00 mg/dL - 0.90 - 0.85 -   BP/Weight 08/21/2019 04/21/2019 01/20/2019 01/13/2019 [id number]  Systolic BP [id number] - - 123456  Diastolic BP 76 78 - - 86  Wt. (Lbs) 244.8 247.4 240 240 240.6  BMI 42.68 42.47 41.2 41.2 41.3   Foot/eye exam completion dates Latest Ref Rng & Units 12/18/2018 08/19/2018  Eye Exam No Retinopathy - -  Foot Form Completion - Done Done    Kayla [name]  reports that she has never smoked. She has never used smokeless tobacco. She reports that she does not drink alcohol or use drugs.     Assessment & Plan:    Diabetes mellitus type 2 with complications ([place]) -  Plan: POCT glycosylated hemoglobin (Hb A1C)  Hypertension associated with diabetes ([place])  Morbid obesity ([place])  Hyperlipidemia associated with type 2 diabetes mellitus ([place])   1. Rx changes: none 2. Education: Reviewed 'ABCs' of diabetes management (respective goals in parentheses):  A1C (<7), blood pressure (<130/80), and cholesterol (LDL <100). 3. Compliance at present is estimated to be fair. Efforts to improve compliance (if necessary) will be directed at increased exercise.  Her size and mobility issues really keep her from exercising on any greater level.  I will keep encourage her to do more. 4. Follow up: 4 months

## 2019-08-25 ENCOUNTER — Other Ambulatory Visit: Payer: Medicare PPO | Admitting: Orthotics

## 2019-09-12 DIAGNOSIS — Z1231 Encounter for screening mammogram for malignant neoplasm of breast: Secondary | ICD-10-CM | POA: Diagnosis not present

## 2019-09-12 LAB — HM MAMMOGRAPHY

## 2019-10-07 ENCOUNTER — Other Ambulatory Visit: Payer: Self-pay | Admitting: Family Medicine

## 2019-10-07 DIAGNOSIS — E1140 Type 2 diabetes mellitus with diabetic neuropathy, unspecified: Secondary | ICD-10-CM

## 2019-11-05 ENCOUNTER — Ambulatory Visit: Payer: Medicare PPO | Admitting: Podiatry

## 2019-11-05 ENCOUNTER — Other Ambulatory Visit: Payer: Self-pay

## 2019-11-05 DIAGNOSIS — R600 Localized edema: Secondary | ICD-10-CM

## 2019-11-05 DIAGNOSIS — M79676 Pain in unspecified toe(s): Secondary | ICD-10-CM | POA: Diagnosis not present

## 2019-11-05 DIAGNOSIS — E118 Type 2 diabetes mellitus with unspecified complications: Secondary | ICD-10-CM

## 2019-11-05 DIAGNOSIS — B351 Tinea unguium: Secondary | ICD-10-CM

## 2019-11-05 DIAGNOSIS — L84 Corns and callosities: Secondary | ICD-10-CM

## 2019-11-05 NOTE — Patient Instructions (Signed)
Diabetes Mellitus and Foot Care Foot care is an important part of your health, especially when you have diabetes. Diabetes may cause you to have problems because of poor blood flow (circulation) to your feet and legs, which can cause your skin to:  Become thinner and drier.  Break more easily.  Heal more slowly.  Peel and crack. You may also have nerve damage (neuropathy) in your legs and feet, causing decreased feeling in them. This means that you may not notice minor injuries to your feet that could lead to more serious problems. Noticing and addressing any potential problems early is the best way to prevent future foot problems. How to care for your feet Foot hygiene  Wash your feet daily with warm water and mild soap. Do not use hot water. Then, pat your feet and the areas between your toes until they are completely dry. Do not soak your feet as this can dry your skin.  Trim your toenails straight across. Do not dig under them or around the cuticle. File the edges of your nails with an emery board or nail file.  Apply a moisturizing lotion or petroleum jelly to the skin on your feet and to dry, brittle toenails. Use lotion that does not contain alcohol and is unscented. Do not apply lotion between your toes. Shoes and socks  Wear clean socks or stockings every day. Make sure they are not too tight. Do not wear knee-high stockings since they may decrease blood flow to your legs.  Wear shoes that fit properly and have enough cushioning. Always look in your shoes before you put them on to be sure there are no objects inside.  To break in new shoes, wear them for just a few hours a day. This prevents injuries on your feet. Wounds, scrapes, corns, and calluses  Check your feet daily for blisters, cuts, bruises, sores, and redness. If you cannot see the bottom of your feet, use a mirror or ask someone for help.  Do not cut corns or calluses or try to remove them with medicine.  If you  find a minor scrape, cut, or break in the skin on your feet, keep it and the skin around it clean and dry. You may clean these areas with mild soap and water. Do not clean the area with peroxide, alcohol, or iodine.  If you have a wound, scrape, corn, or callus on your foot, look at it several times a day to make sure it is healing and not infected. Check for: ? Redness, swelling, or pain. ? Fluid or blood. ? Warmth. ? Pus or a bad smell. General instructions  Do not cross your legs. This may decrease blood flow to your feet.  Do not use heating pads or hot water bottles on your feet. They may burn your skin. If you have lost feeling in your feet or legs, you may not know this is happening until it is too late.  Protect your feet from hot and cold by wearing shoes, such as at the beach or on hot pavement.  Schedule a complete foot exam at least once a year (annually) or more often if you have foot problems. If you have foot problems, report any cuts, sores, or bruises to your health care provider immediately. Contact a health care provider if:  You have a medical condition that increases your risk of infection and you have any cuts, sores, or bruises on your feet.  You have an injury that is not   healing.  You have redness on your legs or feet.  You feel burning or tingling in your legs or feet.  You have pain or cramps in your legs and feet.  Your legs or feet are numb.  Your feet always feel cold.  You have pain around a toenail. Get help right away if:  You have a wound, scrape, corn, or callus on your foot and: ? You have pain, swelling, or redness that gets worse. ? You have fluid or blood coming from the wound, scrape, corn, or callus. ? Your wound, scrape, corn, or callus feels warm to the touch. ? You have pus or a bad smell coming from the wound, scrape, corn, or callus. ? You have a fever. ? You have a red line going up your leg. Summary  Check your feet every day  for cuts, sores, red spots, swelling, and blisters.  Moisturize feet and legs daily.  Wear shoes that fit properly and have enough cushioning.  If you have foot problems, report any cuts, sores, or bruises to your health care provider immediately.  Schedule a complete foot exam at least once a year (annually) or more often if you have foot problems. This information is not intended to replace advice given to you by your health care provider. Make sure you discuss any questions you have with your health care provider. Document Revised: 04/09/2019 Document Reviewed: 08/18/2016 Elsevier Patient Education  2020 [employer] Inc[employer]  

## 2019-11-06 ENCOUNTER — Ambulatory Visit: Payer: Medicare PPO | Admitting: Family Medicine

## 2019-11-06 ENCOUNTER — Encounter: Payer: Self-pay | Admitting: Family Medicine

## 2019-11-06 ENCOUNTER — Encounter: Payer: Self-pay | Admitting: Podiatry

## 2019-11-06 VITALS — BP 138/66 | HR 87 | Temp 100.0°F

## 2019-11-06 DIAGNOSIS — T148XXA Other injury of unspecified body region, initial encounter: Secondary | ICD-10-CM | POA: Diagnosis not present

## 2019-11-06 NOTE — Progress Notes (Signed)
   Subjective:    Patient ID: Kayla [name], female    DOB: October 24, 1940, 79 y.o.   MRN: [id number]  HPI She has a history of a blister on the left lateral lower calf.  She is not having any pain, redness, swelling but does complain of left foot pain that she has had for quite some time.   Review of Systems     Objective:   Physical Exam Alert and in no distress.  A 3 cm blister is noted in the above mentioned area.  It was drained of clear fluid.  Skin is normal in temperature with no erythema or tenderness to palpation. A Band-Aid was applied.     Assessment & Plan:  Blister There is no real reason as to why she has this.  I explained that if this occurs again, we would need to see her as at this point there is no good reason why she would have a blister in an area where there is no real evidence of trauma.  It also does not appear to be edematous in nature.

## 2019-11-06 NOTE — Progress Notes (Signed)
Subjective: Kayla [name] presents today for follow up for preventative diabetic foot care and callus(es) b/l heels and painful mycotic toenails b/l that are difficult to trim. Pain interferes with ambulation. Aggravating factors include wearing enclosed shoe gear. Pain is relieved with periodic professional debridement.  She has new concern today of left leg blister which erupted on yesterday. She states area started draining. Left leg is also noted to be more swollen than her right leg. She states this is normal. She denies any fever, chills, nightsweats, nausea or vomiting. Her son placed a dressing on it.   She has additional request of needing help to place new tennis balls on her walker.  No Known Allergies   Objective: Pt is a pleasant 79 y.o. year old AA female, morbidly obese  in NAD. AAO x 3.   Vascular Examination:  Capillary refill time to digits immediate b/l. Faintly palpable DP pulses b/l. Faintly palpable PT pulses b/l. Pedal hair absent b/l Skin temperature gradient within normal limits b/l. Lower extremity edema b/l left > right. Evidence of chronic venous insufficiency b/l LE. No pain with calf compression b/l.  Dermatological Examination: No interdigital macerations bilaterally. Toenails 1-5 b/l elongated, dystrophic, thickened, crumbly with subungual debris and tenderness to dorsal palpation. Dressing noted left lower leg and is clean, dry and intact. Hyperkeratotic lesion(s) plantar aspect b/l heel pads.  No erythema, no edema, no drainage, no flocculence.  Musculoskeletal: Normal muscle strength 5/5 to all lower extremity muscle groups bilaterally, no pain crepitus or joint limitation noted with ROM b/l, pes planus deformity noted and utilizes walker for ambulation assistance.  Her walker has worn tennis balls which no longer provide protection for her walker.  Neurological: Protective sensation intact 5/5 intact bilaterally with 10g monofilament b/l Vibratory  sensation intact b/l.  Assessment: 1. Pain due to onychomycosis of toenail   2. Callus   3. Localized edema   4. Diabetes mellitus type 2 with complications ([place])    Plan: -Regarding left leg, Ms. Hooper was instructed to contact her PCP or Cardiologist tomorrow as she needs both wound care of left leg and chronic edema of her lower extremities addressed. She states she will contact her PCP on tomorrow. -I removed old worn tennis balls from her walker and place new ones on for her. -Continue diabetic foot care principles. Literature dispensed on today.  -Toenails 1-5 b/l were debrided in length and girth with sterile nail nippers and dremel without iatrogenic bleeding.  -Callus(es) plantar aspect b/l heel pads were debrided without complication or incident. Total number debrided =2. -Patient to continue soft, supportive shoe gear daily. -Patient to report any pedal injuries to medical professional immediately. -Patient/POA to call should there be question/concern in the interim.  Return in about 3 months (around 02/04/2020).

## 2019-11-10 ENCOUNTER — Other Ambulatory Visit: Payer: Self-pay

## 2019-11-10 ENCOUNTER — Ambulatory Visit (INDEPENDENT_AMBULATORY_CARE_PROVIDER_SITE_OTHER): Payer: Medicare PPO | Admitting: Family Medicine

## 2019-11-10 ENCOUNTER — Encounter: Payer: Self-pay | Admitting: Family Medicine

## 2019-11-10 VITALS — BP 130/68 | HR 100 | Temp 97.3°F

## 2019-11-10 DIAGNOSIS — L03119 Cellulitis of unspecified part of limb: Secondary | ICD-10-CM | POA: Diagnosis not present

## 2019-11-10 DIAGNOSIS — T148XXA Other injury of unspecified body region, initial encounter: Secondary | ICD-10-CM | POA: Diagnosis not present

## 2019-11-10 MED ORDER — DOXYCYCLINE HYCLATE 100 MG PO TABS: 100.0000 mg | ORAL_TABLET | Freq: Two times a day (BID) | ORAL | 0 refills | Status: DC

## 2019-11-10 NOTE — Progress Notes (Signed)
   Subjective:    Patient ID: Kayla [name], female    DOB: 12/04/1940, 79 y.o.   MRN: [id number]  HPI She is here for recheck on her left calf area.  It continues to drain clear fluids.  She thinks that the area is now more swollen.   Review of Systems     Objective:   Physical Exam Alert and in no distress.  Exam of the left calf area does show slight erythema as well as edema but it is not warm or tender.  There is still drainage from a 3 x 3 cm denuded area.      Assessment & Plan:

## 2019-11-17 ENCOUNTER — Telehealth: Payer: Self-pay

## 2019-11-17 NOTE — Telephone Encounter (Signed)
Pt. Called stating that she has ran out of the little gel packets you gave her last time she was in the office for her wound. She said if someone could call her back so she can send her son to pick up the gel packets.

## 2019-11-17 NOTE — Telephone Encounter (Signed)
Triple antibiotic ointment 

## 2019-11-17 NOTE — Telephone Encounter (Signed)
Pt. Aware that her son can pick up her ointment.

## 2019-11-24 ENCOUNTER — Ambulatory Visit: Payer: Medicare PPO | Admitting: Family Medicine

## 2019-11-25 ENCOUNTER — Other Ambulatory Visit: Payer: Self-pay

## 2019-11-25 ENCOUNTER — Ambulatory Visit (INDEPENDENT_AMBULATORY_CARE_PROVIDER_SITE_OTHER): Payer: Medicare PPO | Admitting: Family Medicine

## 2019-11-25 ENCOUNTER — Encounter: Payer: Self-pay | Admitting: Family Medicine

## 2019-11-25 VITALS — BP 146/74 | HR 84 | Temp 98.7°F

## 2019-11-25 DIAGNOSIS — I890 Lymphedema, not elsewhere classified: Secondary | ICD-10-CM

## 2019-11-25 NOTE — Progress Notes (Signed)
   Subjective:    Patient ID: Kayla [name], female    DOB: 15-Mar-1941, 79 y.o.   MRN: [id number]  HPI She is here for a recheck.  She continues have difficulty with drainage from the left lower extremity.   Review of Systems     Objective:   Physical Exam The original lesion seems to be slowly healing however more edema is noted in her lower extremities as well as pitting edema.       Assessment & Plan:  Lymphedema - Plan: AMB referral to wound care center Although it does not appear to be infected I think the lymphedema is making it such that whenever than a cleared up unless we get more aggressive with getting rid of the edema.  However further wound management

## 2019-11-27 ENCOUNTER — Other Ambulatory Visit: Payer: Self-pay

## 2019-11-27 DIAGNOSIS — I890 Lymphedema, not elsewhere classified: Secondary | ICD-10-CM

## 2019-11-27 DIAGNOSIS — T148XXA Other injury of unspecified body region, initial encounter: Secondary | ICD-10-CM

## 2019-12-02 ENCOUNTER — Telehealth: Payer: Self-pay | Admitting: Family Medicine

## 2019-12-02 NOTE — Telephone Encounter (Signed)
Patient would like to speak to Maudie Mercury about information she received from [place]. Pt is aware that Maudie Mercury is out of the office and could possibly be out tomorrow but she wants to speak directly to [facility] and will wait for her call

## 2019-12-03 ENCOUNTER — Telehealth: Payer: Self-pay | Admitting: Family Medicine

## 2019-12-03 NOTE — Telephone Encounter (Signed)
Pt called and states call her regarding wound care.  They are saying all booked up. Pt ph [phone]

## 2019-12-04 ENCOUNTER — Telehealth: Payer: Self-pay

## 2019-12-04 NOTE — Telephone Encounter (Signed)
Pt was advised that her referral had to be sent over to [facility] due to her insurance and other offices staffing issues. Pt was also advised that it may be a three day turn around from [facility]. [place]

## 2019-12-05 ENCOUNTER — Ambulatory Visit: Payer: Medicare PPO | Admitting: Podiatry

## 2019-12-08 ENCOUNTER — Telehealth: Payer: Self-pay

## 2019-12-08 NOTE — Telephone Encounter (Signed)
Called pt to have her to come into the office for a follow up on her leg. Pt referral was put in a over a week ago to multiple home health agenices and they are either under staffed or do not take her insurance. LVM for pt to call and make an appointment.

## 2019-12-09 ENCOUNTER — Encounter: Payer: Self-pay | Admitting: Family Medicine

## 2019-12-09 ENCOUNTER — Other Ambulatory Visit: Payer: Self-pay

## 2019-12-09 ENCOUNTER — Ambulatory Visit: Payer: Medicare PPO | Admitting: Family Medicine

## 2019-12-09 VITALS — BP 132/76 | HR 92 | Temp 97.5°F

## 2019-12-09 DIAGNOSIS — L03119 Cellulitis of unspecified part of limb: Secondary | ICD-10-CM

## 2019-12-09 DIAGNOSIS — I890 Lymphedema, not elsewhere classified: Secondary | ICD-10-CM | POA: Diagnosis not present

## 2019-12-09 NOTE — Progress Notes (Signed)
   Subjective:    Patient ID: Kayla [name], female    DOB: 1940-11-12, 79 y.o.   MRN: [id number]  HPI She is here for recheck on her lymphedema and cellulitis.  We have had difficult time getting an agency to come help take care of her at home.   Review of Systems     Objective:   Physical Exam Alert and in no distress.  The wound is pinkish with no discernible drainage.  The surrounding tissue appears normal.       Assessment & Plan:  Lymphedema  Cellulitis of calf The wound looks quite good.  Recommend she change the dressing daily.  She is to wash it with soap and water, use a little bit of antibiotic ointment on this as well as a dressing to keep it covered.  We will try to attempt to get some data, help change it as it would be very difficult for her to do on her own due to her size.  Recheck here in 2 weeks.

## 2019-12-11 ENCOUNTER — Other Ambulatory Visit: Payer: Self-pay

## 2019-12-11 NOTE — Patient Outreach (Signed)
[facility]) Care Management  12/11/2019  SPECIAL GARTLEY 1941-07-22 [id number]   Telephone Screen  Referral Date: 12/11/2019 Referral Source: MD Office Referral Reason: "pt needs wound care and issue is either not enough staffing or do not take insurance" Insurance: [employer]   Outreach attempt # 1 to patient.     Plan: RN CM will make outreach attempt to patient within 3-4 business days. RN CM will send message to referral source to advise that [facility] does not provide [facility] and/or wound care services.    Enzo Montgomery, RN,BSN,CCM [place] Management Telephonic Care Management Coordinator Direct Phone: [phone] Toll Free: [phone] Fax: [phone]

## 2019-12-12 ENCOUNTER — Telehealth: Payer: Self-pay

## 2019-12-12 ENCOUNTER — Other Ambulatory Visit: Payer: Self-pay

## 2019-12-12 NOTE — Patient Outreach (Signed)
[facility]) Care Management  12/12/2019  Kayla [name] 06-06-1941 [id number]   Telephone Screen  Referral Date: 12/11/2019 Referral Source: MD Office Referral Reason: "pt needs wound care and issue is either not enough staffing or do not take insurance" Insurance: [employer]   Outreach attempt #2 to patient. No answer after several rings. Noted in chart referral office aware that [facility] does not provide [place] services/wound care and office notified patient of this.       Plan: RN CM will make outreach attempt to patient within 3-4 business days to assess for any other [facility] needs.  Enzo Montgomery, RN,BSN,CCM [place] Management Telephonic Care Management Coordinator Direct Phone: [phone] Toll Free: [phone] Fax: [phone]

## 2019-12-12 NOTE — Telephone Encounter (Signed)
Pt was called to advised we are still trying to find an agency to help with her wound care. As of 12-12-19 no luck. [place]

## 2019-12-12 NOTE — Telephone Encounter (Signed)
Called pt to advise [facility] wasn't able to help. Placed a call back to [facility]

## 2019-12-12 NOTE — Telephone Encounter (Signed)
[facility] advised they still do not have staffing but to resend referral over to check . [place]

## 2019-12-15 ENCOUNTER — Other Ambulatory Visit: Payer: Self-pay

## 2019-12-15 NOTE — Patient Outreach (Signed)
[facility]) Care Management  12/15/2019  Kayla [name] 1940/11/19 [id number]   Telephone Screen  Referral Date:12/11/2019 Referral Source:MD Office Referral Reason:"pt needs wound care and issue is either not enough staffing or do not take insurance" Insurance:[employer]   Outreach attempt #3 to patient to assess for any other [facility] needs other than wound care. No answer after several rings.     Plan: RN CM will close case if no response from letter mailed to patient.   Enzo Montgomery, RN,BSN,CCM [place] Management Telephonic Care Management Coordinator Direct Phone: [phone] Toll Free: [phone] Fax: [phone]

## 2019-12-16 ENCOUNTER — Ambulatory Visit: Payer: Medicare Other

## 2019-12-23 ENCOUNTER — Ambulatory Visit: Payer: Medicare PPO | Admitting: Family Medicine

## 2019-12-23 ENCOUNTER — Encounter: Payer: Self-pay | Admitting: Family Medicine

## 2019-12-23 ENCOUNTER — Other Ambulatory Visit: Payer: Self-pay

## 2019-12-23 DIAGNOSIS — E1140 Type 2 diabetes mellitus with diabetic neuropathy, unspecified: Secondary | ICD-10-CM

## 2019-12-23 DIAGNOSIS — I890 Lymphedema, not elsewhere classified: Secondary | ICD-10-CM | POA: Insufficient documentation

## 2019-12-23 DIAGNOSIS — R296 Repeated falls: Secondary | ICD-10-CM

## 2019-12-23 NOTE — Progress Notes (Signed)
   Subjective:    Patient ID: [name], female    DOB: 12/23/1940, 79 y.o.   MRN: [id number]  HPI She is here for a recheck on her left lateral calf wound.  This seems to be slowly healing.  She does have an underlying history of lymphedema, diabetic neuropathy, morbid obesity and multiple falls putting her at great risk for worsening of her overall health.  We have had a very difficult time getting the wound care taken care of at home and have had to bring her here for multiple visits to get this taken care of.  She is now applying through [facility] to help with this as well as physical therapy and general needs.  She continues on medications listed in her chart.   Review of Systems     Objective:   Physical Exam Alert and in no distress.  Exam of the left lower leg does show evidence of edema as well as pinkish tissue that does seem to be healing.  There is no surrounding erythema warmth or tenderness.      Assessment & Plan:  Morbid obesity ([place])  Type 2 diabetes mellitus with diabetic neuropathy, without long-term current use of insulin ([place])  Multiple falls  Lymphedema Paperwork will be filled out to help with wound care as well as physical therapy and general health needs.

## 2019-12-24 ENCOUNTER — Other Ambulatory Visit: Payer: Self-pay

## 2019-12-24 NOTE — Patient Outreach (Signed)
[facility]) Care Management  12/24/2019  Kayla [name] 10/23/1940 [id number]   Telephone Screen  Referral Date:12/11/2019 Referral Source:MD Office Referral Reason:"pt needs wound care and issue is either not enough staffing or do not take insurance" Insurance:[employer]   Multiple attempts to establish contact with patient without success. No response from letter mailed to patient. Case is being closed at this time.     Plan: RN CM will close case at this time.   Enzo Montgomery, RN,BSN,CCM [place] Management Telephonic Care Management Coordinator Direct Phone: [phone] Toll Free: [phone] Fax: [phone]

## 2019-12-31 ENCOUNTER — Telehealth: Payer: Self-pay

## 2019-12-31 NOTE — Telephone Encounter (Signed)
Spoke to pt advising of next appt date and paperwork that was faxed to health and human services in [place] . Pt says she will call and check on status of her application for help at home [facility]

## 2020-01-03 ENCOUNTER — Other Ambulatory Visit: Payer: Self-pay | Admitting: Family Medicine

## 2020-01-03 DIAGNOSIS — E1140 Type 2 diabetes mellitus with diabetic neuropathy, unspecified: Secondary | ICD-10-CM

## 2020-01-05 NOTE — Telephone Encounter (Signed)
Pt called to check on refill. She states she is almost out.

## 2020-01-07 ENCOUNTER — Telehealth: Payer: Self-pay | Admitting: Family Medicine

## 2020-01-07 NOTE — Telephone Encounter (Signed)
Pt called and is requesting a refill on her losartan-hctz [id number] [place], [place]

## 2020-01-08 ENCOUNTER — Other Ambulatory Visit: Payer: Self-pay

## 2020-01-08 DIAGNOSIS — E1159 Type 2 diabetes mellitus with other circulatory complications: Secondary | ICD-10-CM

## 2020-01-08 DIAGNOSIS — I152 Hypertension secondary to endocrine disorders: Secondary | ICD-10-CM

## 2020-01-08 MED ORDER — LOSARTAN POTASSIUM-HCTZ 100-12.5 MG PO TABS: 1.0000 | ORAL_TABLET | Freq: Every day | ORAL | 3 refills | Status: DC

## 2020-01-20 ENCOUNTER — Other Ambulatory Visit: Payer: Self-pay

## 2020-01-20 ENCOUNTER — Encounter: Payer: Self-pay | Admitting: Family Medicine

## 2020-01-20 ENCOUNTER — Ambulatory Visit: Payer: Medicare PPO | Admitting: Family Medicine

## 2020-01-20 VITALS — BP 160/86 | HR 71 | Temp 97.8°F | Wt 250.2 lb

## 2020-01-20 DIAGNOSIS — I152 Hypertension secondary to endocrine disorders: Secondary | ICD-10-CM

## 2020-01-20 DIAGNOSIS — E118 Type 2 diabetes mellitus with unspecified complications: Secondary | ICD-10-CM | POA: Diagnosis not present

## 2020-01-20 DIAGNOSIS — M25512 Pain in left shoulder: Secondary | ICD-10-CM | POA: Diagnosis not present

## 2020-01-20 DIAGNOSIS — I10 Essential (primary) hypertension: Secondary | ICD-10-CM

## 2020-01-20 DIAGNOSIS — R609 Edema, unspecified: Secondary | ICD-10-CM

## 2020-01-20 DIAGNOSIS — D638 Anemia in other chronic diseases classified elsewhere: Secondary | ICD-10-CM | POA: Diagnosis not present

## 2020-01-20 DIAGNOSIS — E1140 Type 2 diabetes mellitus with diabetic neuropathy, unspecified: Secondary | ICD-10-CM

## 2020-01-20 DIAGNOSIS — T148XXA Other injury of unspecified body region, initial encounter: Secondary | ICD-10-CM

## 2020-01-20 DIAGNOSIS — E785 Hyperlipidemia, unspecified: Secondary | ICD-10-CM

## 2020-01-20 DIAGNOSIS — E1169 Type 2 diabetes mellitus with other specified complication: Secondary | ICD-10-CM

## 2020-01-20 DIAGNOSIS — E1159 Type 2 diabetes mellitus with other circulatory complications: Secondary | ICD-10-CM | POA: Diagnosis not present

## 2020-01-20 DIAGNOSIS — I890 Lymphedema, not elsewhere classified: Secondary | ICD-10-CM

## 2020-01-20 MED ORDER — ATORVASTATIN CALCIUM 80 MG PO TABS: 80.0000 mg | ORAL_TABLET | Freq: Every day | ORAL | 1 refills | Status: DC

## 2020-01-20 NOTE — Progress Notes (Signed)
Subjective:    Patient ID: Kayla [name], female    DOB: [birth year], 79 y.o.   MRN: [id number]  Kayla [name] is a 79 y.o. female who presents for follow-up of Type 2 diabetes mellitus.  Home blood sugar records: fasting range: 107 to150 Current symptoms/problems include none and have been unchanged. Daily foot checks:   Any foot concerns: no Exercise: The patient does not participate in regular exercise at present. Diet:reg She stopped taking atorvastatin and losartan due to concerns she heard about from other people about possible side effects.  She has continued on her Metformin.  She also stopped taking her iron.  She does have a history of anemia of chronic illness.  She also complains of left shoulder pain and is difficult to get a good history as to exactly when it started other than several months ago.  No history of injury or overuse.  She also needs the blistering on the left lateral lower calf reevaluated. The following portions of the patient's history were reviewed and updated as appropriate: allergies, current medications, past medical history, past social history and problem list.  ROS as in subjective above.     Objective:    Physical Exam Alert and in no distress .  Exam of the left shoulder does show some limitation of motion in all directions.  Difficult to do Neer's and Hawkins test.  Exam of left lower extremity does show healing lesion with central pinkish tissue and surrounding pigmentation changes that are slowly coalescing  Blood pressure (!) 160/86, pulse 71, temperature 97.8 F (36.6 C), weight 250 lb 3.2 oz (113.5 kg), SpO2 97 %.  Lab Review Diabetic Labs Latest Ref Rng & Units 08/21/2019 04/21/2019 12/18/2018 07/09/2018 04/16/2018  HbA1c 4.0 - 5.6 % 6.5(A) - 6.6(A) - 6.7(A)  Chol 100 - 199 mg/dL - 177 272(H) - -  HDL >39 mg/dL - 51 59 - -  Calc LDL 0 - 99 mg/dL - 108(H) 194(H) - -  Triglycerides 0 - 149 mg/dL - 96 93 - -  Creatinine 0.57 - 1.00 mg/dL - 0.90  - 0.85 -   BP/Weight 01/20/2020 12/23/2019 12/09/2019 11/25/2019 2/35/5732  Systolic BP 202 542 706 237 628  Diastolic BP 86 74 76 74 68  Wt. (Lbs) 250.2 - - - -  BMI 43.63 - - - -   Foot/eye exam completion dates Latest Ref Rng & Units 12/18/2018 08/19/2018  Eye Exam No Retinopathy - -  Foot Form Completion - Done Done  Hemoglobin A1c is 6.6.  Kayla [name]  reports that she has never smoked. She has never used smokeless tobacco. She reports that she does not drink alcohol and does not use drugs.     Assessment & Plan:    Diabetes mellitus type 2 with complications (HCC)  Morbid obesity (HCC)  Type 2 diabetes mellitus with diabetic neuropathy, without long-term current use of insulin (HCC)  Lymphedema  Blister  Left shoulder pain, unspecified chronicity - Plan: DG Shoulder Left  Hypertension associated with diabetes ([place])  Anemia of chronic illness - Plan: CBC with Differential/Platelet  Dependent edema  Hyperlipidemia associated with type 2 diabetes mellitus ([place]) - Plan: atorvastatin (LIPITOR) 80 MG tablet I will get an x-ray and follow-up depending upon what that shows. Also encouraged her to get the Covid vaccine. 1. Rx changes: none I reinforced concerns that she had.  The benefits greatly outweigh the risks.  The need for her to get back on her atorvastatin as well as losartan.  Explained that there is not any danger with these medications in terms of the 2. Education: Reviewed 'ABCs' of diabetes management (respective goals in parentheses):  A1C (<7), blood pressure (<130/80), and cholesterol (LDL <100). 3. Compliance at present is estimated to be fair. Efforts to improve compliance (if necessary) will be directed at increased exercise. 4. Follow up: 4 months The left leg was cleaned and it does appear quite dry.  Just recommended putting a Band-Aid on that to protect it so it can heal more.  She does not need the bandages off.

## 2020-01-20 NOTE — Patient Instructions (Addendum)
The Metformin is to lower your blood sugar for your diabetes The atorvastatin is to lower your cholesterol to help prevent heart attack The losartan just to lower your blood pressure to also keep you from having a heart attack or kidney failure You should be on the losartan 100/12.5

## 2020-01-21 LAB — CBC WITH DIFFERENTIAL/PLATELET
Basophils Absolute: 0.1 10*3/uL (ref 0.0–0.2)
Basos: 1 %
EOS (ABSOLUTE): 0.3 10*3/uL (ref 0.0–0.4)
Eos: 5 %
Hematocrit: 31.9 % — ABNORMAL LOW (ref 34.0–46.6)
Hemoglobin: 10 g/dL — ABNORMAL LOW (ref 11.1–15.9)
Immature Grans (Abs): 0.1 10*3/uL (ref 0.0–0.1)
Immature Granulocytes: 1 %
Lymphocytes Absolute: 1.3 10*3/uL (ref 0.7–3.1)
Lymphs: 25 %
MCH: 26.2 pg — ABNORMAL LOW (ref 26.6–33.0)
MCHC: 31.3 g/dL — ABNORMAL LOW (ref 31.5–35.7)
MCV: 84 fL (ref 79–97)
Monocytes Absolute: 0.5 10*3/uL (ref 0.1–0.9)
Monocytes: 10 %
Neutrophils Absolute: 3 10*3/uL (ref 1.4–7.0)
Neutrophils: 58 %
Platelets: 251 10*3/uL (ref 150–450)
RBC: 3.81 x10E6/uL (ref 3.77–5.28)
RDW: 13.4 % (ref 11.7–15.4)
WBC: 5.2 10*3/uL (ref 3.4–10.8)

## 2020-01-28 ENCOUNTER — Other Ambulatory Visit: Payer: Self-pay | Admitting: Family Medicine

## 2020-01-28 ENCOUNTER — Ambulatory Visit
Admission: RE | Admit: 2020-01-28 | Discharge: 2020-01-28 | Disposition: A | Discharge status: Home / Self Care | Payer: Medicare PPO | Source: Ambulatory Visit | Attending: Family Medicine | Admitting: Family Medicine

## 2020-01-28 DIAGNOSIS — M25512 Pain in left shoulder: Secondary | ICD-10-CM

## 2020-01-28 DIAGNOSIS — M19012 Primary osteoarthritis, left shoulder: Secondary | ICD-10-CM | POA: Diagnosis not present

## 2020-02-05 ENCOUNTER — Other Ambulatory Visit: Payer: Self-pay | Admitting: Family Medicine

## 2020-02-05 DIAGNOSIS — E1140 Type 2 diabetes mellitus with diabetic neuropathy, unspecified: Secondary | ICD-10-CM

## 2020-02-06 ENCOUNTER — Other Ambulatory Visit: Payer: Self-pay

## 2020-02-06 ENCOUNTER — Ambulatory Visit: Payer: Medicare PPO | Admitting: Podiatry

## 2020-02-06 VITALS — Temp 97.3°F

## 2020-02-06 DIAGNOSIS — L84 Corns and callosities: Secondary | ICD-10-CM | POA: Diagnosis not present

## 2020-02-06 DIAGNOSIS — E1142 Type 2 diabetes mellitus with diabetic polyneuropathy: Secondary | ICD-10-CM | POA: Diagnosis not present

## 2020-02-06 DIAGNOSIS — B351 Tinea unguium: Secondary | ICD-10-CM

## 2020-02-06 DIAGNOSIS — M79676 Pain in unspecified toe(s): Secondary | ICD-10-CM | POA: Diagnosis not present

## 2020-02-11 ENCOUNTER — Encounter: Payer: Self-pay | Admitting: Podiatry

## 2020-02-11 NOTE — Progress Notes (Signed)
Subjective: Kayla [name] presents today preventative diabetic foot care and painful callus(es) b/l heels and painful thick toenails that are difficult to trim. Pain interferes with ambulation. Aggravating factors include wearing enclosed shoe gear. Pain is relieved with periodic professional debridement.   Patient states left leg blister has healed.  She states she needs help at home in order to perform her daily activities.   Denita Lung, MD is patient's PCP. Last visit was: 12/23/2019.  Past Medical History:  Diagnosis Date  . Arthritis   . Diabetes mellitus   . Hypertension   . Obesity   . Pulmonary embolus [facility])      Patient Active Problem List   Diagnosis Date Noted  . Lymphedema 12/23/2019  . Arthritis 12/18/2018  . Fatigue 07/09/2018  . Gastroesophageal reflux disease without esophagitis 04/16/2018  . Dependent edema 01/01/2017  . Anemia of chronic illness 02/15/2015  . Multiple falls 02/15/2015  . Diabetic neuropathy, type II diabetes mellitus ([place]) 09/09/2012  . Hyperlipidemia associated with type 2 diabetes mellitus ([place]) 05/22/2011  . Hypertension associated with diabetes ([place]) 05/22/2011  . Morbid obesity ([place]) 12/14/2010  . Diabetes mellitus type 2 with complications ([place]) 57/84/6962  . History of pulmonary embolus ([place]) 12/14/2010    Current Outpatient Medications on File Prior to Visit  Medication Sig Dispense Refill  . aspirin 325 MG EC tablet USE PER THE INSTRUCTIONS ON PACKAGE LABEL 125 tablet 4  . atorvastatin (LIPITOR) 80 MG tablet Take 1 tablet (80 mg total) by mouth daily. 90 tablet 1  . carvedilol (COREG) 3.125 MG tablet TAKE 1 TABLET BY MOUTH TWICE A DAY 180 tablet 3  . diclofenac Sodium (VOLTAREN) 1 % GEL Apply 2 g topically 4 (four) times daily. Rub into affected area of foot 2 to 4 times daily 100 g 2  . glucose blood (ONETOUCH VERIO) test strip Use as instructed 100 each 12  . Lancets (ONETOUCH DELICA PLUS [id number]) MISC     .  losartan-hydrochlorothiazide (HYZAAR) 100-12.5 MG tablet Take 1 tablet by mouth daily. 90 tablet 3  . metFORMIN (GLUCOPHAGE) 500 MG tablet TAKE 1 TABLET BY MOUTH TWICE A DAY WITH MEALS 180 tablet 0  . Multiple Vitamins-Minerals (MULTIVITAMIN WITH MINERALS) tablet Take 1 tablet by mouth daily.    . TRIAMCINOLONE ACETONIDE, TOP, 0.05 % OINT Apply to rough skin on heels twice daily 430 g 1  . ammonium lactate (LAC-HYDRIN) 12 % lotion Apply 1 application topically as needed for dry skin. (Patient not taking: Reported on 01/20/2020) 400 g 5  . IRON, FERROUS GLUCONATE, PO Take by mouth. (Patient not taking: Reported on 01/20/2020)    . meloxicam (MOBIC) 7.5 MG tablet TAKE 1 TABLET BY MOUTH EVERY DAY (Patient not taking: Reported on 11/06/2019) 30 tablet 0  . methocarbamol (ROBAXIN) 500 MG tablet Take 1 tablet (500 mg total) by mouth 2 (two) times daily. (Patient not taking: Reported on 11/06/2019) 20 tablet 0  . pantoprazole (PROTONIX) 20 MG tablet TAKE 1 TABLET BY MOUTH EVERY DAY (Patient not taking: Reported on 11/06/2019) 90 tablet 0   No current facility-administered medications on file prior to visit.     No Known Allergies  Objective: Kayla TWICHELL is a pleasant 79 y.o. African American female morbidly obese in NAD. AAO x 3.  Vitals:   02/06/20 1126  Temp: (!) 97.3 F (36.3 C)    Vascular Examination: Capillary fill time to digits <3 seconds b/l lower extremities. Faintly palpable pedal pulses b/l. Pedal hair absent.  Lower extremity skin temperature gradient within normal limits. No pain with calf compression b/l. Lymphedema present b/l lower extremities.  Dermatological Examination: Pedal skin with normal turgor, texture and tone bilaterally. No open wounds bilaterally. No interdigital macerations bilaterally. Toenails 1-5 b/l elongated, discolored, dystrophic, thickened, crumbly with subungual debris and tenderness to dorsal palpation. Hyperkeratotic lesion(s) plantar aspect b/l heel pads.   No erythema, no edema, no drainage, no flocculence.  Musculoskeletal: Muscle strength 4/5to all LE muscle groups of b/l lower extremities. No pain crepitus or joint limitation noted with ROM b/l. Pes planus deformity noted b/l.  Utilizes wheelchair for mobility assistance.  Neurological Examination: Protective sensation intact 5/5 intact bilaterally with 10g monofilament b/l. Vibratory sensation intact b/l. Proprioception intact bilaterally. Clonus negative b/l.  Last A1c: Hemoglobin A1C Latest Ref Rng & Units 08/21/2019  HGBA1C 4.0 - 5.6 % 6.5(A)  Some recent data might be hidden   Assessment: 1. Pain due to onychomycosis of toenail   2. Callus   3. Diabetic peripheral neuropathy associated with type 2 diabetes mellitus ([place])    Plan: -Examined patient. -Continue diabetic foot care principles. -Toenails 1-5 b/l were debrided in length and girth with sterile nail nippers and dremel without iatrogenic bleeding.  -Callus(es) plantar aspect b/l heel pads pared utilizing sterile scalpel blade without complication or incident. Total number debrided =2. -Patient to report any pedal injuries to medical professional immediately. -Patient to continue soft, supportive shoe gear daily. -Patient/POA to call should there be question/concern in the interim.  Return in about 3 months (around 05/08/2020).  Marzetta Board, DPM

## 2020-03-08 ENCOUNTER — Other Ambulatory Visit: Payer: Self-pay | Admitting: Family Medicine

## 2020-03-10 ENCOUNTER — Telehealth: Payer: Self-pay

## 2020-03-10 NOTE — Telephone Encounter (Signed)
Spoke to son and advised that she can come by and pick a accu chek meter and a script can be sent in for the supplies. Son Reita Cliche ) advised that they would call us back tomorrow . [place]

## 2020-03-10 NOTE — Telephone Encounter (Signed)
Patient called about her Verio test strips. Called the pharmacy and they stated she needs a prior authorization. Please advise

## 2020-03-10 NOTE — Telephone Encounter (Signed)
Spoke to Kayla [name] about the authorization and she stated they will not approve without a reason. Patient has been informed that insurance won't cover verio test strips and that she will need to change to accu check or another device her insurance will cover. Patient stated she does not have a way of getting to the pharmacy right now.

## 2020-03-11 ENCOUNTER — Telehealth: Payer: Self-pay

## 2020-03-11 ENCOUNTER — Other Ambulatory Visit: Payer: Self-pay

## 2020-03-11 MED ORDER — GLUCOSE BLOOD VI STRP: 1.0000 | ORAL_STRIP | 3 refills | Status: DC | PRN

## 2020-03-11 MED ORDER — ACCU-CHEK SOFTCLIX LANCETS MISC: 1.0000 | Freq: Once | 3 refills | Status: AC

## 2020-03-11 NOTE — Telephone Encounter (Signed)
Meter was placed up front for pick up up. [place]

## 2020-03-11 NOTE — Telephone Encounter (Signed)
Pt. Called back about the accu chek meter I gave her the info in the message that she could pick it up here and we could call the supplies in to the pharmacy. She said she would have to get someone to come pick it up for her and she wasn't sure when that would be but would call back once she found a ride.

## 2020-03-25 ENCOUNTER — Other Ambulatory Visit: Payer: Self-pay

## 2020-03-25 ENCOUNTER — Telehealth: Payer: Self-pay

## 2020-03-25 MED ORDER — ACCU-CHEK SOFTCLIX LANCETS MISC: 1.0000 | Freq: Every day | 6 refills | Status: DC

## 2020-03-25 MED ORDER — GLUCOSE BLOOD VI STRP: 1.0000 | ORAL_STRIP | 3 refills | Status: DC | PRN

## 2020-03-25 NOTE — Telephone Encounter (Signed)
[facility] Needs new script sent in for accu-chek guide test strips and accu chek soft clix lancets. Pt. Last apt was 01/20/20 next apt is 05/24/20.

## 2020-03-26 NOTE — Telephone Encounter (Signed)
Pt. Called back I let her know that you sent the accu chek strips and lancets to [facility] since she has trouble getting rides to the pharmacy to pick up her meds.

## 2020-03-26 NOTE — Telephone Encounter (Signed)
Called pt to confirm if she wanted [facility] order. No answer. [place]

## 2020-03-29 ENCOUNTER — Other Ambulatory Visit: Payer: Self-pay | Admitting: Family Medicine

## 2020-03-30 ENCOUNTER — Other Ambulatory Visit: Payer: Self-pay

## 2020-03-30 MED ORDER — ACCU-CHEK SOFTCLIX LANCETS MISC: 1.0000 | Freq: Every day | 6 refills | Status: DC

## 2020-03-30 MED ORDER — GLUCOSE BLOOD VI STRP: 1.0000 | ORAL_STRIP | 3 refills | Status: DC | PRN

## 2020-04-09 ENCOUNTER — Other Ambulatory Visit: Payer: Self-pay | Admitting: Family Medicine

## 2020-04-09 DIAGNOSIS — E1140 Type 2 diabetes mellitus with diabetic neuropathy, unspecified: Secondary | ICD-10-CM

## 2020-04-09 DIAGNOSIS — E1159 Type 2 diabetes mellitus with other circulatory complications: Secondary | ICD-10-CM

## 2020-04-09 DIAGNOSIS — I152 Hypertension secondary to endocrine disorders: Secondary | ICD-10-CM

## 2020-04-21 ENCOUNTER — Encounter: Payer: Self-pay | Admitting: Family Medicine

## 2020-04-21 ENCOUNTER — Other Ambulatory Visit: Payer: Self-pay

## 2020-04-21 ENCOUNTER — Ambulatory Visit: Payer: Medicare PPO | Admitting: Family Medicine

## 2020-04-21 VITALS — BP 170/88 | HR 81 | Temp 98.1°F | Wt 256.6 lb

## 2020-04-21 DIAGNOSIS — T148XXA Other injury of unspecified body region, initial encounter: Secondary | ICD-10-CM | POA: Diagnosis not present

## 2020-04-21 DIAGNOSIS — I890 Lymphedema, not elsewhere classified: Secondary | ICD-10-CM | POA: Diagnosis not present

## 2020-04-21 MED ORDER — DOXYCYCLINE HYCLATE 100 MG PO TABS: 100.0000 mg | ORAL_TABLET | Freq: Two times a day (BID) | ORAL | 0 refills | Status: DC

## 2020-04-21 NOTE — Progress Notes (Signed)
   Subjective:    Patient ID: Kayla [name], female    DOB: Dec 24, 1940, 79 y.o.   MRN: [id number]  HPI  She is here for evaluation of continued difficulty with lower leg swelling and now blistering occurring in the same area as her previous wound.  No fever or chills but does complain of some discomfort.   Review of Systems     Objective:   Physical Exam Alert and in no distress.  Diffuse lower extremity edema is noted to the point of peau d'orange.  Blister noted on the left lateral lower extremity and slight erythema is noted.       Assessment & Plan:  Lymphedema  Blister - Plan: doxycycline (VIBRA-TABS) 100 MG tablet I explained that I will place her back on doxycycline but we need to get her in for wound care as the last treatment with the antibiotic although it helped her.remedy the situation entirely.  Referral will be made for wound care management.

## 2020-04-22 ENCOUNTER — Ambulatory Visit: Payer: Self-pay | Admitting: Family Medicine

## 2020-04-22 NOTE — Addendum Note (Signed)
Addended by: Elyse Jarvis on: 04/22/2020 08:30 AM   Modules accepted: Orders

## 2020-04-27 DIAGNOSIS — R609 Edema, unspecified: Secondary | ICD-10-CM | POA: Diagnosis not present

## 2020-04-27 DIAGNOSIS — I872 Venous insufficiency (chronic) (peripheral): Secondary | ICD-10-CM | POA: Diagnosis not present

## 2020-05-14 ENCOUNTER — Encounter: Payer: Self-pay | Admitting: Podiatry

## 2020-05-14 ENCOUNTER — Other Ambulatory Visit: Payer: Self-pay

## 2020-05-14 ENCOUNTER — Ambulatory Visit: Payer: Medicare PPO | Admitting: Podiatry

## 2020-05-14 DIAGNOSIS — M79676 Pain in unspecified toe(s): Secondary | ICD-10-CM | POA: Diagnosis not present

## 2020-05-14 DIAGNOSIS — E1142 Type 2 diabetes mellitus with diabetic polyneuropathy: Secondary | ICD-10-CM | POA: Diagnosis not present

## 2020-05-14 DIAGNOSIS — M21072 Valgus deformity, not elsewhere classified, left ankle: Secondary | ICD-10-CM

## 2020-05-14 DIAGNOSIS — B351 Tinea unguium: Secondary | ICD-10-CM | POA: Diagnosis not present

## 2020-05-14 DIAGNOSIS — L84 Corns and callosities: Secondary | ICD-10-CM | POA: Diagnosis not present

## 2020-05-18 NOTE — Progress Notes (Signed)
Subjective: Kayla [name] presents today preventative diabetic foot care and painful callus(es) b/l heels and painful thick toenails that are difficult to trim. Pain interferes with ambulation. Aggravating factors include wearing enclosed shoe gear. Pain is relieved with periodic professional debridement.   Patient states left leg blister opened. She states she called insurance company and they suggested she see a dermatologist for the wound. She will follow up with Dermatologist next week.  She states she still has not been able to obtain ADL assistance through any Rusk, John C, MD is patient's PCP. Last visit was: 04/21/2020.  Past Medical History:  Diagnosis Date  . Arthritis   . Diabetes mellitus   . Hypertension   . Obesity   . Pulmonary embolus [facility])      Patient Active Problem List   Diagnosis Date Noted  . Lymphedema 12/23/2019  . Arthritis 12/18/2018  . Fatigue 07/09/2018  . Gastroesophageal reflux disease without esophagitis 04/16/2018  . Dependent edema 01/01/2017  . Anemia of chronic illness 02/15/2015  . Multiple falls 02/15/2015  . Diabetic neuropathy, type II diabetes mellitus ([place]) 09/09/2012  . Hyperlipidemia associated with type 2 diabetes mellitus ([place]) 05/22/2011  . Hypertension associated with diabetes ([place]) 05/22/2011  . Morbid obesity ([place]) 12/14/2010  . Diabetes mellitus type 2 with complications ([place]) 29/51/8841  . History of pulmonary embolus (PE) 12/14/2010    Current Outpatient Medications on File Prior to Visit  Medication Sig Dispense Refill  . Accu-Chek Softclix Lancets lancets 1 each by Other route daily. Use as instructed 100 each 6  . acetaminophen (TYLENOL) 650 MG CR tablet Take 650 mg by mouth every 8 (eight) hours as needed for pain.    [name] ammonium lactate (LAC-HYDRIN) 12 % lotion Apply 1 application topically as needed for dry skin. (Patient not taking: Reported on 01/20/2020) 400 g 5  . aspirin 325 MG EC tablet USE  PER THE INSTRUCTIONS ON PACKAGE LABEL 125 tablet 0  . atorvastatin (LIPITOR) 80 MG tablet Take 1 tablet (80 mg total) by mouth daily. 90 tablet 1  . augmented betamethasone dipropionate (DIPROLENE-AF) 0.05 % ointment     . carvedilol (COREG) 3.125 MG tablet TAKE 1 TABLET BY MOUTH TWICE A DAY 180 tablet 0  . diclofenac Sodium (VOLTAREN) 1 % GEL Apply 2 g topically 4 (four) times daily. Rub into affected area of foot 2 to 4 times daily 100 g 2  . doxycycline (VIBRA-TABS) 100 MG tablet Take 1 tablet (100 mg total) by mouth 2 (two) times daily. 28 tablet 0  . glucose blood test strip 1 each by Other route as needed for other. Use as instructed 100 each 3  . IRON, FERROUS GLUCONATE, PO Take by mouth.     . losartan-hydrochlorothiazide (HYZAAR) 100-12.5 MG tablet Take 1 tablet by mouth daily. 90 tablet 3  . meloxicam (MOBIC) 7.5 MG tablet TAKE 1 TABLET BY MOUTH EVERY DAY (Patient not taking: Reported on 11/06/2019) 30 tablet 0  . metFORMIN (GLUCOPHAGE) 500 MG tablet TAKE 1 TABLET BY MOUTH TWICE A DAY WITH MEALS 180 tablet 0  . methocarbamol (ROBAXIN) 500 MG tablet Take 1 tablet (500 mg total) by mouth 2 (two) times daily. (Patient not taking: Reported on 11/06/2019) 20 tablet 0  . Multiple Vitamins-Minerals (MULTIVITAMIN WITH MINERALS) tablet Take 1 tablet by mouth daily.    . pantoprazole (PROTONIX) 20 MG tablet TAKE 1 TABLET BY MOUTH EVERY DAY (Patient not taking: Reported on 11/06/2019) 90 tablet 0  .  TRIAMCINOLONE ACETONIDE, TOP, 0.05 % OINT Apply to rough skin on heels twice daily 430 g 1   No current facility-administered medications on file prior to visit.     No Known Allergies  Objective: Kayla [name] is a pleasant 79 y.o. African American female morbidly obese in NAD. AAO x 3.  There were no vitals filed for this visit.  Vascular Examination: Capillary fill time to digits <3 seconds b/l lower extremities. Faintly palpable pedal pulses b/l. Pedal hair absent. Lower extremity skin  temperature gradient within normal limits. No pain with calf compression b/l. Lymphedema present b/l lower extremities.  Dermatological Examination: Pedal skin with normal turgor, texture and tone bilaterally. No open wounds bilaterally. No interdigital macerations bilaterally. Toenails 1-5 b/l elongated, discolored, dystrophic, thickened, crumbly with subungual debris and tenderness to dorsal palpation. Hyperkeratotic lesion(s) plantar aspect b/l heel pads.  No erythema, no edema, no drainage, no flocculence. She has a healing 2.0 x 2.0 x 0.2 cm punched out ulceration on anterior aspect of mid 1/3 of left leg. No drainage, no surrounding erythema, no purulence, no warmth.      Musculoskeletal: Muscle strength 4/5to all LE muscle groups of b/l lower extremities. No pain crepitus or joint limitation noted with ROM b/l. Pes planus deformity noted b/l.  Utilizes wheelchair for mobility assistance.  Neurological Examination: Protective sensation intact 5/5 intact bilaterally with 10g monofilament b/l. Vibratory sensation intact b/l. Proprioception intact bilaterally. Clonus negative b/l.  Last A1c: Hemoglobin A1C Latest Ref Rng & Units 08/21/2019  HGBA1C 4.0 - 5.6 % 6.5(A)  Some recent data might be hidden   Assessment: 1. Pain due to onychomycosis of toenail   2. Callus   3. Acquired valgus deformity of left ankle   4. Diabetic peripheral neuropathy associated with type 2 diabetes mellitus ([place])    Plan: -Examined patient. -Continue diabetic foot care principles. -Per medical record review, Dr. Redmond School placed her on doxycycline and made wound care referral for LLE. -I have spoken with Dr. Lanice Shirts staff on last visit and there are issues finding [place] that is in network with her insurance company. Patient's son is aware. -Toenails 1-5 b/l were debrided in length and girth with sterile nail nippers and dremel without iatrogenic bleeding.  -Callus(es) plantar aspect b/l heel  pads pared utilizing sterile scalpel blade without complication or incident. Total number debrided =2. -Patient to report any pedal injuries to medical professional immediately. -Patient to continue soft, supportive shoe gear daily. -Patient/POA to call should there be question/concern in the interim.  Return in about 3 months (around 08/14/2020) for diabetic nail and callus trim.  Marzetta Board, DPM

## 2020-05-24 ENCOUNTER — Ambulatory Visit: Payer: Self-pay | Admitting: Family Medicine

## 2020-05-25 DIAGNOSIS — I83009 Varicose veins of unspecified lower extremity with ulcer of unspecified site: Secondary | ICD-10-CM | POA: Diagnosis not present

## 2020-05-25 DIAGNOSIS — I872 Venous insufficiency (chronic) (peripheral): Secondary | ICD-10-CM | POA: Diagnosis not present

## 2020-05-28 ENCOUNTER — Ambulatory Visit: Payer: Self-pay | Admitting: Family Medicine

## 2020-05-29 ENCOUNTER — Other Ambulatory Visit: Payer: Self-pay | Admitting: Family Medicine

## 2020-06-01 ENCOUNTER — Encounter: Payer: Self-pay | Admitting: Family Medicine

## 2020-06-01 ENCOUNTER — Ambulatory Visit: Payer: Medicare PPO | Admitting: Family Medicine

## 2020-06-01 ENCOUNTER — Other Ambulatory Visit: Payer: Self-pay

## 2020-06-01 VITALS — BP 134/84 | HR 76 | Temp 96.7°F | Wt 232.6 lb

## 2020-06-01 DIAGNOSIS — E1159 Type 2 diabetes mellitus with other circulatory complications: Secondary | ICD-10-CM

## 2020-06-01 DIAGNOSIS — R609 Edema, unspecified: Secondary | ICD-10-CM | POA: Diagnosis not present

## 2020-06-01 DIAGNOSIS — E1140 Type 2 diabetes mellitus with diabetic neuropathy, unspecified: Secondary | ICD-10-CM | POA: Diagnosis not present

## 2020-06-01 DIAGNOSIS — E785 Hyperlipidemia, unspecified: Secondary | ICD-10-CM

## 2020-06-01 DIAGNOSIS — E1169 Type 2 diabetes mellitus with other specified complication: Secondary | ICD-10-CM

## 2020-06-01 DIAGNOSIS — I152 Hypertension secondary to endocrine disorders: Secondary | ICD-10-CM

## 2020-06-01 DIAGNOSIS — D638 Anemia in other chronic diseases classified elsewhere: Secondary | ICD-10-CM | POA: Diagnosis not present

## 2020-06-01 DIAGNOSIS — Z23 Encounter for immunization: Secondary | ICD-10-CM | POA: Diagnosis not present

## 2020-06-01 LAB — LIPID PANEL

## 2020-06-01 LAB — POCT GLYCOSYLATED HEMOGLOBIN (HGB A1C): Hemoglobin A1C: 6.5 % — AB (ref 4.0–5.6)

## 2020-06-01 NOTE — Progress Notes (Signed)
  Subjective:    Patient ID: Kayla [name], female    DOB: [birth year], 79 y.o.   MRN: [id number]  Kayla [name] is a 79 y.o. female who presents for follow-up of Type 2 diabetes mellitus.  Home blood sugar records: meter record, fasting, 108-140 Current symptoms/problems include none at this time. Daily foot checks:yes   Any foot concerns: none Exercise: staying active, most the time she is sitting in the wheelchair. Diet: good She continues on Lipitor 80 mg, Coreg, losartan/HCTZ, Metformin, iron supplementation and is having no difficulty with any of these. The following portions of the patient's history were reviewed and updated as appropriate: allergies, current medications, past medical history, past social history and problem list.  ROS as in subjective above.     Objective:    Physical Exam Alert and in no distress otherwise not examined.  Hemoglobin A1c is 6.5 Lab Review Diabetic Labs Latest Ref Rng & Units 08/21/2019 04/21/2019 12/18/2018 07/09/2018 04/16/2018  HbA1c 4.0 - 5.6 % 6.5(A) - 6.6(A) - 6.7(A)  Chol 100 - 199 mg/dL - 177 272(H) - -  HDL >39 mg/dL - 51 59 - -  Calc LDL 0 - 99 mg/dL - 108(H) 194(H) - -  Triglycerides 0 - 149 mg/dL - 96 93 - -  Creatinine 0.57 - 1.00 mg/dL - 0.90 - 0.85 -   BP/Weight 04/21/2020 01/20/2020 12/23/2019 12/09/2019 2/56/3893  Systolic BP 734 287 681 157 262  Diastolic BP 88 86 74 76 74  Wt. (Lbs) 256.6 250.2 - - -  BMI 44.74 43.63 - - -   Foot/eye exam completion dates Latest Ref Rng & Units 12/18/2018 08/19/2018  Eye Exam No Retinopathy - -  Foot Form Completion - Done Done    Kayla [name]  reports that she has never smoked. She has never used smokeless tobacco. She reports that she does not drink alcohol and does not use drugs.     Assessment & Plan:    Type 2 diabetes mellitus with diabetic neuropathy, without long-term current use of insulin ([place]) - Plan: POCT glycosylated hemoglobin (Hb A1C), Lipid panel, Comprehensive metabolic  panel, CBC with Differential/Platelet  Morbid obesity (HCC)  Hypertension associated with diabetes ([place]) - Plan: Comprehensive metabolic panel, CBC with Differential/Platelet  Anemia of chronic illness - Plan: CBC with Differential/Platelet  Dependent edema  Hyperlipidemia associated with type 2 diabetes mellitus ([place]) - Plan: Lipid panel  Need for influenza vaccination   1. Rx changes: none 2. Education: Reviewed 'ABCs' of diabetes management (respective goals in parentheses):  A1C (<7), blood pressure (<130/80), and cholesterol (LDL <100). 3. Compliance at present is estimated to be fair. Efforts to improve compliance (if necessary) will be directed at increased exercise. 4. Follow up: 6 months I again encouraged her to be as physically active as possible.  Also discussed in detail Covid vaccines however at this time she is not interested.

## 2020-06-01 NOTE — Patient Instructions (Signed)
20 minutes a day of activity of any kind

## 2020-06-02 LAB — LIPID PANEL
Chol/HDL Ratio: 3.6 ratio (ref 0.0–4.4)
Cholesterol, Total: 186 mg/dL (ref 100–199)
HDL: 51 mg/dL (ref >39)
LDL Chol Calc (NIH): 118 mg/dL — ABNORMAL HIGH (ref 0–99)
Triglycerides: 94 mg/dL (ref 0–149)
VLDL Cholesterol Cal: 17 mg/dL (ref 5–40)

## 2020-06-02 LAB — CBC WITH DIFFERENTIAL/PLATELET
Basophils Absolute: 0.1 10*3/uL (ref 0.0–0.2)
Basos: 1 %
EOS (ABSOLUTE): 0.2 10*3/uL (ref 0.0–0.4)
Eos: 4 %
Hematocrit: 32.8 % — ABNORMAL LOW (ref 34.0–46.6)
Hemoglobin: 10.3 g/dL — ABNORMAL LOW (ref 11.1–15.9)
Immature Grans (Abs): 0 10*3/uL (ref 0.0–0.1)
Immature Granulocytes: 0 %
Lymphocytes Absolute: 1.3 10*3/uL (ref 0.7–3.1)
Lymphs: 27 %
MCH: 26.3 pg — ABNORMAL LOW (ref 26.6–33.0)
MCHC: 31.4 g/dL — ABNORMAL LOW (ref 31.5–35.7)
MCV: 84 fL (ref 79–97)
Monocytes Absolute: 0.6 10*3/uL (ref 0.1–0.9)
Monocytes: 11 %
Neutrophils Absolute: 2.8 10*3/uL (ref 1.4–7.0)
Neutrophils: 57 %
Platelets: 217 10*3/uL (ref 150–450)
RBC: 3.92 x10E6/uL (ref 3.77–5.28)
RDW: 13.8 % (ref 11.7–15.4)
WBC: 4.9 10*3/uL (ref 3.4–10.8)

## 2020-06-02 LAB — COMPREHENSIVE METABOLIC PANEL
ALT: 11 IU/L (ref 0–32)
AST: 18 IU/L (ref 0–40)
Albumin/Globulin Ratio: 1.4 (ref 1.2–2.2)
Albumin: 4.1 g/dL (ref 3.6–4.6)
Alkaline Phosphatase: 106 IU/L (ref 44–121)
BUN/Creatinine Ratio: 24 (ref 12–28)
BUN: 24 mg/dL (ref 8–27)
Bilirubin Total: 0.2 mg/dL (ref 0.0–1.2)
CO2: 25 mmol/L (ref 20–29)
Calcium: 9.3 mg/dL (ref 8.7–10.3)
Chloride: 104 mmol/L (ref 96–106)
Creatinine, Ser: 0.98 mg/dL (ref 0.57–1.00)
GFR calc Af Amer: 63 mL/min/{1.73_m2} (ref >59)
GFR calc non Af Amer: 54 mL/min/{1.73_m2} — ABNORMAL LOW (ref >59)
Globulin, Total: 2.9 g/dL (ref 1.5–4.5)
Glucose: 101 mg/dL — ABNORMAL HIGH (ref 65–99)
Potassium: 4.6 mmol/L (ref 3.5–5.2)
Sodium: 142 mmol/L (ref 134–144)
Total Protein: 7 g/dL (ref 6.0–8.5)

## 2020-06-18 DIAGNOSIS — E119 Type 2 diabetes mellitus without complications: Secondary | ICD-10-CM | POA: Diagnosis not present

## 2020-06-18 DIAGNOSIS — H2513 Age-related nuclear cataract, bilateral: Secondary | ICD-10-CM | POA: Diagnosis not present

## 2020-06-18 LAB — HM DIABETES EYE EXAM

## 2020-07-01 ENCOUNTER — Encounter: Payer: Self-pay | Admitting: Family Medicine

## 2020-07-08 ENCOUNTER — Other Ambulatory Visit: Payer: Self-pay

## 2020-07-08 ENCOUNTER — Encounter: Payer: Self-pay | Admitting: Family Medicine

## 2020-07-08 ENCOUNTER — Ambulatory Visit: Payer: Medicare PPO | Admitting: Family Medicine

## 2020-07-08 VITALS — BP 120/80 | HR 85 | Wt 245.2 lb

## 2020-07-08 DIAGNOSIS — Z719 Counseling, unspecified: Secondary | ICD-10-CM

## 2020-07-08 DIAGNOSIS — Z7189 Other specified counseling: Secondary | ICD-10-CM | POA: Diagnosis not present

## 2020-07-08 NOTE — Progress Notes (Signed)
   Subjective:    Patient ID: Kayla [name], female    DOB: Jun 21, 1941, 79 y.o.   MRN: [id number]  HPI She is here for consult concerning feeling of application form for GTA access. She does have arthritis, morbid obesity, lymphedema that interfere with her mobility. She is also here to discuss getting the Covid vaccine.  Review of Systems     Objective:   Physical Exam Alert and in no distress otherwise not examined       Assessment & Plan:  Advice given about COVID-19 virus infection  Encounter for consultation The form was filled out for mobility access. I then discussed Covid vaccine, the benefits and possible side effects with her. She plans to return when her son comes back into town and get the shot when he is around.

## 2020-07-09 ENCOUNTER — Telehealth: Payer: Self-pay

## 2020-07-09 NOTE — Telephone Encounter (Signed)
Form for gta was faxed and copied. Pt will pick up her copy 07/16/20 on her nurse visit. Pt is aware. [place]

## 2020-07-16 ENCOUNTER — Ambulatory Visit: Payer: Medicare PPO

## 2020-07-18 ENCOUNTER — Other Ambulatory Visit: Payer: Self-pay | Admitting: Family Medicine

## 2020-07-18 DIAGNOSIS — E1159 Type 2 diabetes mellitus with other circulatory complications: Secondary | ICD-10-CM

## 2020-07-28 ENCOUNTER — Ambulatory Visit (INDEPENDENT_AMBULATORY_CARE_PROVIDER_SITE_OTHER): Payer: Medicare PPO

## 2020-07-28 ENCOUNTER — Other Ambulatory Visit: Payer: Self-pay

## 2020-07-28 DIAGNOSIS — Z23 Encounter for immunization: Secondary | ICD-10-CM | POA: Diagnosis not present

## 2020-08-05 ENCOUNTER — Ambulatory Visit: Payer: Medicare PPO | Admitting: Internal Medicine

## 2020-08-18 ENCOUNTER — Ambulatory Visit: Payer: Medicare PPO

## 2020-08-23 ENCOUNTER — Ambulatory Visit: Payer: Medicare PPO | Admitting: Podiatry

## 2020-08-26 ENCOUNTER — Other Ambulatory Visit: Payer: Self-pay | Admitting: Family Medicine

## 2020-08-26 DIAGNOSIS — E1140 Type 2 diabetes mellitus with diabetic neuropathy, unspecified: Secondary | ICD-10-CM

## 2020-08-30 ENCOUNTER — Ambulatory Visit (INDEPENDENT_AMBULATORY_CARE_PROVIDER_SITE_OTHER): Payer: Medicare PPO | Admitting: Podiatry

## 2020-08-30 ENCOUNTER — Other Ambulatory Visit: Payer: Self-pay

## 2020-08-30 ENCOUNTER — Encounter: Payer: Self-pay | Admitting: Podiatry

## 2020-08-30 DIAGNOSIS — K625 Hemorrhage of anus and rectum: Secondary | ICD-10-CM | POA: Insufficient documentation

## 2020-08-30 DIAGNOSIS — K6289 Other specified diseases of anus and rectum: Secondary | ICD-10-CM | POA: Insufficient documentation

## 2020-08-30 DIAGNOSIS — B351 Tinea unguium: Secondary | ICD-10-CM | POA: Diagnosis not present

## 2020-08-30 DIAGNOSIS — D500 Iron deficiency anemia secondary to blood loss (chronic): Secondary | ICD-10-CM | POA: Insufficient documentation

## 2020-08-30 DIAGNOSIS — L84 Corns and callosities: Secondary | ICD-10-CM | POA: Diagnosis not present

## 2020-08-30 DIAGNOSIS — M79676 Pain in unspecified toe(s): Secondary | ICD-10-CM

## 2020-08-30 DIAGNOSIS — E1142 Type 2 diabetes mellitus with diabetic polyneuropathy: Secondary | ICD-10-CM

## 2020-08-30 DIAGNOSIS — K5900 Constipation, unspecified: Secondary | ICD-10-CM | POA: Insufficient documentation

## 2020-08-30 DIAGNOSIS — R195 Other fecal abnormalities: Secondary | ICD-10-CM | POA: Insufficient documentation

## 2020-08-30 DIAGNOSIS — R600 Localized edema: Secondary | ICD-10-CM

## 2020-08-30 DIAGNOSIS — M21072 Valgus deformity, not elsewhere classified, left ankle: Secondary | ICD-10-CM

## 2020-08-30 DIAGNOSIS — K649 Unspecified hemorrhoids: Secondary | ICD-10-CM | POA: Insufficient documentation

## 2020-08-30 NOTE — Progress Notes (Signed)
Subjective: Kayla [name] presents today preventative diabetic foot care and painful callus(es) b/l heels and painful thick toenails that are difficult to trim. Pain interferes with ambulation. Aggravating factors include wearing enclosed shoe gear. Pain is relieved with periodic professional debridement.   She relates no new pedal problems on today's visit.  Kayla [name] is patient's PCP. Last visit was: 07/08/2020.  Past Medical History:  Diagnosis Date  . Arthritis   . Diabetes mellitus   . Hypertension   . Obesity   . Pulmonary embolus [facility])      Patient Active Problem List   Diagnosis Date Noted  . Anemia due to chronic blood loss 08/30/2020  . Constipation 08/30/2020  . Feces contents abnormal 08/30/2020  . Hemorrhoids 08/30/2020  . Proctalgia 08/30/2020  . Rectal bleeding 08/30/2020  . Lymphedema 12/23/2019  . Arthritis 12/18/2018  . Fatigue 07/09/2018  . Gastroesophageal reflux disease without esophagitis 04/16/2018  . Dependent edema 01/01/2017  . Anemia of chronic illness 02/15/2015  . Multiple falls 02/15/2015  . Diabetic neuropathy, type II diabetes mellitus ([place]) 09/09/2012  . Hyperlipidemia associated with type 2 diabetes mellitus ([place]) 05/22/2011  . Hypertension associated with diabetes ([place]) 05/22/2011  . Morbid obesity ([place]) 12/14/2010  . Diabetes mellitus type 2 with complications ([place]) 74/25/9563  . History of pulmonary embolus (PE) 12/14/2010    Current Outpatient Medications on File Prior to Visit  Medication Sig Dispense Refill  . nitroGLYCERIN (NITROGLYN) 2 % OINT ointment See admin instructions.    . Accu-Chek Softclix Lancets lancets 1 each by Other route daily. Use as instructed 100 each 6  . acetaminophen (TYLENOL) 650 MG CR tablet Take 650 mg by mouth every 8 (eight) hours as needed for pain.    . Acetaminophen-guaiFENesin (TYLENOL CHEST CONGESTION PO)     . ammonium lactate (LAC-HYDRIN) 12 % lotion Apply 1 application topically as  needed for dry skin. 400 g 5  . aspirin ([facility] ASPIRIN EC) 325 MG EC tablet USE PER THE INSTRUCTIONS ON PACKAGE LABEL 125 tablet 0  . Aspirin 81 MG CAPS     . atorvastatin (LIPITOR) 80 MG tablet Take 1 tablet (80 mg total) by mouth daily. 90 tablet 1  . augmented betamethasone dipropionate (DIPROLENE-AF) 0.05 % ointment     . carvedilol (COREG) 3.125 MG tablet TAKE 1 TABLET BY MOUTH TWICE A DAY 180 tablet 0  . CARVEDILOL PO     . diclofenac Sodium (VOLTAREN) 1 % GEL Apply 2 g topically 4 (four) times daily. Rub into affected area of foot 2 to 4 times daily 100 g 2  . glucose blood test strip 1 each by Other route as needed for other. Use as instructed 100 each 3  . IRON, FERROUS GLUCONATE, PO Take by mouth.     . linaCLOtide (LINZESS PO)     . LOSARTAN POTASSIUM PO     . losartan-hydrochlorothiazide (HYZAAR) 100-12.5 MG tablet Take 1 tablet by mouth daily. 90 tablet 3  . meloxicam (MOBIC) 7.5 MG tablet TAKE 1 TABLET BY MOUTH EVERY DAY 30 tablet 0  . metFORMIN (GLUCOPHAGE) 500 MG tablet TAKE 1 TABLET BY MOUTH TWICE A DAY WITH MEALS 180 tablet 0  . METFORMIN HCL PO     . Multiple Vitamins-Minerals (MULTIVITAMIN WITH MINERALS) tablet Take 1 tablet by mouth daily.    . TRIAMCINOLONE ACETONIDE, TOP, 0.05 % OINT Apply to rough skin on heels twice daily 430 g 1   No current facility-administered medications on file prior  to visit.     No Known Allergies  Objective: Kayla [name] is a pleasant 80 y.o. African American female morbidly obese in NAD. AAO x 3.  There were no vitals filed for this visit.  Vascular Examination: Capillary fill time to digits <3 seconds b/l lower extremities. Faintly palpable pedal pulses b/l. Pedal hair absent. Lower extremity skin temperature gradient within normal limits. No pain with calf compression b/l. Lymphedema present b/l lower extremities.  Dermatological Examination: Pedal skin with normal turgor, texture and tone bilaterally. No open wounds bilaterally.  No interdigital macerations bilaterally. Toenails 1-5 b/l elongated, discolored, dystrophic, thickened, crumbly with subungual debris and tenderness to dorsal palpation. Hyperkeratotic lesion(s) plantar aspect b/l heel pads.  No erythema, no edema, no drainage, no fluctuance.  Musculoskeletal: Muscle strength 4/5to all LE muscle groups of b/l lower extremities. No pain crepitus or joint limitation noted with ROM b/l. Pes planus deformity noted b/l.  Utilizes wheelchair for mobility assistance.  Neurological Examination: Protective sensation intact 5/5 intact bilaterally with 10g monofilament b/l. Vibratory sensation intact b/l. Proprioception intact bilaterally. Clonus negative b/l.  Last A1c: Hemoglobin A1C Latest Ref Rng & Units 06/01/2020  HGBA1C 4.0 - 5.6 % 6.5(A)  Some recent data might be hidden   Assessment: 1. Pain due to onychomycosis of toenail   2. Callus   3. Localized edema   4. Acquired valgus deformity of left ankle   5. Diabetic peripheral neuropathy associated with type 2 diabetes mellitus ([place])    Plan: -Examined patient. -Continue diabetic foot care principles. -Toenails 1-5 b/l were debrided in length and girth with sterile nail nippers and dremel without iatrogenic bleeding.  -Callus(es) plantar aspect b/l heel pads pared utilizing sterile scalpel blade without complication or incident. Total number debrided =2. -Patient to report any pedal injuries to medical professional immediately. -Patient to continue soft, supportive shoe gear daily. -Patient/POA to call should there be question/concern in the interim.  Return in about 3 months (around 11/27/2020).  Kayla [name], DPM

## 2020-09-10 ENCOUNTER — Other Ambulatory Visit: Payer: Self-pay

## 2020-09-10 ENCOUNTER — Ambulatory Visit (INDEPENDENT_AMBULATORY_CARE_PROVIDER_SITE_OTHER): Payer: Medicare PPO

## 2020-09-10 DIAGNOSIS — Z23 Encounter for immunization: Secondary | ICD-10-CM | POA: Diagnosis not present

## 2020-10-06 ENCOUNTER — Other Ambulatory Visit: Payer: Self-pay | Admitting: Family Medicine

## 2020-10-09 ENCOUNTER — Other Ambulatory Visit: Payer: Self-pay | Admitting: Family Medicine

## 2020-10-09 DIAGNOSIS — E1159 Type 2 diabetes mellitus with other circulatory complications: Secondary | ICD-10-CM

## 2020-10-09 DIAGNOSIS — I152 Hypertension secondary to endocrine disorders: Secondary | ICD-10-CM

## 2020-10-13 ENCOUNTER — Encounter (HOSPITAL_COMMUNITY): Payer: Self-pay

## 2020-10-13 ENCOUNTER — Other Ambulatory Visit: Payer: Self-pay

## 2020-10-13 ENCOUNTER — Ambulatory Visit (HOSPITAL_COMMUNITY)
Admission: EM | Admit: 2020-10-13 | Discharge: 2020-10-13 | Disposition: A | Discharge status: Home / Self Care | Payer: Medicare PPO | Attending: Medical Oncology | Admitting: Medical Oncology

## 2020-10-13 DIAGNOSIS — M25562 Pain in left knee: Secondary | ICD-10-CM | POA: Diagnosis not present

## 2020-10-13 MED ORDER — HYDROCODONE-ACETAMINOPHEN 5-325 MG PO TABS: 1.0000 | ORAL_TABLET | Freq: Four times a day (QID) | ORAL | 0 refills | Status: DC | PRN

## 2020-10-13 NOTE — ED Triage Notes (Signed)
Pt in with c/o left knee pain and leg swelling that has been going on for 1 week  States she went to a dermatologist but her leg started to swell again

## 2020-10-13 NOTE — ED Provider Notes (Signed)
[place]    CSN: [id number] Arrival date & time: 10/13/20  1105      History   Chief Complaint Chief Complaint  Patient presents with  . Knee Pain  . Leg Swelling    HPI Kayla [name] is a 80 y.o. female.   Patient is a poor historian  HPI  Knee Pain: Pt with a complex medical history presents with left knee swelling and leg pain for the past week.  Patient states that she has had pain in the anterior part of her left knee for years.  She has had a flare of this x1 week.  She has seen orthopedics in the past and was told that she would need a replacement or steroid injections.  She states that she has used steroid injections in the past but they only lasted 2 to 3 weeks for symptoms.  She reports that she does not want to return to orthopedics every 2 to 3 weeks for injections.  She is currently using Tylenol for pain.  She denies any calf pain, posterior knee pain, edema of the leg, chest pain or shortness of breath.   Past Medical History:  Diagnosis Date  . Arthritis   . Diabetes mellitus   . Hypertension   . Obesity   . Pulmonary embolus [facility])     Patient Active Problem List   Diagnosis Date Noted  . Anemia due to chronic blood loss 08/30/2020  . Constipation 08/30/2020  . Feces contents abnormal 08/30/2020  . Hemorrhoids 08/30/2020  . Proctalgia 08/30/2020  . Rectal bleeding 08/30/2020  . Lymphedema 12/23/2019  . Arthritis 12/18/2018  . Fatigue 07/09/2018  . Gastroesophageal reflux disease without esophagitis 04/16/2018  . Dependent edema 01/01/2017  . Anemia of chronic illness 02/15/2015  . Multiple falls 02/15/2015  . Diabetic neuropathy, type II diabetes mellitus ([place]) 09/09/2012  . Hyperlipidemia associated with type 2 diabetes mellitus ([place]) 05/22/2011  . Hypertension associated with diabetes ([place]) 05/22/2011  . Morbid obesity ([place]) 12/14/2010  . Diabetes mellitus type 2 with complications ([place]) 08/08/3233  . History of pulmonary embolus  (PE) 12/14/2010    Past Surgical History:  Procedure Laterality Date  . ABDOMINAL HYSTERECTOMY    . CHOLECYSTECTOMY    . JOINT REPLACEMENT  Right    OB History   No obstetric history on file.      Home Medications    Prior to Admission medications   Medication Sig Start Date End Date Taking? Authorizing Provider  Accu-Chek Softclix Lancets lancets 1 each by Other route daily. Use as instructed 03/30/20   Denita Lung, MD  acetaminophen (TYLENOL) 650 MG CR tablet Take 650 mg by mouth every 8 (eight) hours as needed for pain.    [provider]  Acetaminophen-guaiFENesin (TYLENOL CHEST CONGESTION PO)     [provider]  ammonium lactate (LAC-HYDRIN) 12 % lotion Apply 1 application topically as needed for dry skin. 05/09/19   Marzetta Board, DPM  aspirin ([facility] ASPIRIN EC) 325 MG EC tablet USE PER THE INSTRUCTIONS ON PACKAGE LABEL 10/06/20   Denita Lung, MD  Aspirin 81 MG CAPS     [provider]  atorvastatin (LIPITOR) 80 MG tablet Take 1 tablet (80 mg total) by mouth daily. 01/20/20   Denita Lung, MD  augmented betamethasone dipropionate (DIPROLENE-AF) 0.05 % ointment  05/03/20   [provider]  carvedilol (COREG) 3.125 MG tablet TAKE 1 TABLET BY MOUTH TWICE A DAY 10/11/20   Redmond School,  Elyse Jarvis, MD  CARVEDILOL PO     [provider]  diclofenac Sodium (VOLTAREN) 1 % GEL Apply 2 g topically 4 (four) times daily. Rub into affected area of foot 2 to 4 times daily 08/11/19   Trula Slade, DPM  glucose blood test strip 1 each by Other route as needed for other. Use as instructed 03/30/20   Denita Lung, MD  IRON, FERROUS GLUCONATE, PO Take by mouth.     [provider]  linaCLOtide (LINZESS PO)     [provider]  LOSARTAN POTASSIUM PO     [provider]  losartan-hydrochlorothiazide (HYZAAR) 100-12.5 MG tablet Take 1 tablet by mouth daily. 01/08/20   Denita Lung, MD  meloxicam (MOBIC) 7.5 MG tablet  TAKE 1 TABLET BY MOUTH EVERY DAY 05/24/18   Evelina Bucy, DPM  metFORMIN (GLUCOPHAGE) 500 MG tablet TAKE 1 TABLET BY MOUTH TWICE A DAY WITH MEALS 08/26/20   Denita Lung, MD  METFORMIN HCL PO     [provider]  Multiple Vitamins-Minerals (MULTIVITAMIN WITH MINERALS) tablet Take 1 tablet by mouth daily.    [provider]  nitroGLYCERIN (NITROGLYN) 2 % OINT ointment See admin instructions. 08/22/17   [provider]  TRIAMCINOLONE ACETONIDE, TOP, 0.05 % OINT Apply to rough skin on heels twice daily 06/04/18   Marzetta Board, DPM    Family History Family History  Problem Relation Age of Onset  . Hypertension Mother     Social History Social History   Tobacco Use  . Smoking status: Never Smoker  . Smokeless tobacco: Never Used  Vaping Use  . Vaping Use: Never used  Substance Use Topics  . Alcohol use: No  . Drug use: No     Allergies   Patient has no known allergies.   Review of Systems Review of Systems  As stated above in HPI Physical Exam Triage Vital Signs ED Triage Vitals  Enc Vitals Group     BP 10/13/20 1142 (!) 158/55     Pulse Rate 10/13/20 1140 90     Resp 10/13/20 1140 19     Temp 10/13/20 1140 98.2 F (36.8 C)     Temp src --      SpO2 10/13/20 1140 100 %     Weight --      Height --      Head Circumference --      Peak Flow --      Pain Score 10/13/20 1138 4     Pain Loc --      Pain Edu? --      Excl. in [place]? --    No data found.  Updated Vital Signs BP (!) 158/55   Pulse 90   Temp 98.2 F (36.8 C)   Resp 19   SpO2 100%   Physical Exam Vitals and nursing note reviewed.  Constitutional:      General: She is not in acute distress.    Appearance: She is not ill-appearing, toxic-appearing or diaphoretic.  Cardiovascular:     Pulses: Normal pulses.  Musculoskeletal:     Comments: Bilateral edema of knees which appears chronic. No erythema, tenderness to palpation. Reduced ROM by 50% of the left left.  NO increased warmth of knee. NO joint instability noted. No posterior left knee tenderness and negative homans sign  Skin:    General: Skin is warm.     Capillary Refill: Capillary refill takes less than 2 seconds.  Neurological:     General: No focal deficit present.     Mental Status: She is alert.      UC Treatments / Results  Labs (all labs ordered are listed, but only abnormal results are displayed) Labs Reviewed - No data to display  EKG   Radiology No results found.  Procedures Procedures (including critical care time)  Medications Ordered in UC Medications - No data to display  Initial Impression / Assessment and Plan / UC Course  I have reviewed the triage vital signs and the nursing notes.  Pertinent labs & imaging results that were available during my care of the patient were reviewed by me and considered in my medical decision making (see chart for details).     New.  This is likely a flare of her arthritis.  I took time to discuss with patient that she will likely need to see a pain management specialist along with an orthopedist.  She states that she does not currently have an orthopedist so I am going to give her information about [facility].  We discussed steroid injections versus gel injection versus replacements on the superficial level.  We also discussed pain management in the meantime and I will send in a short supply of pain medication for her as she is not able to sleep due to her pain.  We discussed how to use along with common potential side effects, black box warnings and fall precautions.  Also discussed red flag signs and symptoms in regard to her symptoms. Final Clinical Impressions(s) / UC Diagnoses   Final diagnoses:  None   Discharge Instructions   None    ED Prescriptions    None     PDMP not reviewed this encounter.   Hughie Closs, [place] 10/13/20 1246

## 2020-10-14 ENCOUNTER — Telehealth (HOSPITAL_COMMUNITY): Payer: Self-pay | Admitting: Emergency Medicine

## 2020-10-14 DIAGNOSIS — Z1231 Encounter for screening mammogram for malignant neoplasm of breast: Secondary | ICD-10-CM | POA: Diagnosis not present

## 2020-10-14 LAB — HM MAMMOGRAPHY

## 2020-10-14 MED ORDER — HYDROCODONE-ACETAMINOPHEN 5-325 MG PO TABS: 1.0000 | ORAL_TABLET | Freq: Four times a day (QID) | ORAL | 0 refills | Status: DC | PRN

## 2020-10-14 NOTE — Telephone Encounter (Signed)
Pharmacy change.  Meds ordered this encounter  Medications  . HYDROcodone-acetaminophen (NORCO/VICODIN) 5-325 MG tablet    Sig: Take 1 tablet by mouth every 6 (six) hours as needed for moderate pain or severe pain.    Dispense:  10 tablet    Refill:  0

## 2020-10-14 NOTE — Telephone Encounter (Signed)
Called and cancelled prescription for [facility] at [facility] for patient, they are out of stock.  Will be resent to [facility] on [address], per patient request.

## 2020-10-19 ENCOUNTER — Encounter: Payer: Self-pay | Admitting: Family Medicine

## 2020-10-22 DIAGNOSIS — I890 Lymphedema, not elsewhere classified: Secondary | ICD-10-CM | POA: Diagnosis not present

## 2020-10-22 DIAGNOSIS — E1169 Type 2 diabetes mellitus with other specified complication: Secondary | ICD-10-CM | POA: Diagnosis not present

## 2020-10-22 DIAGNOSIS — I509 Heart failure, unspecified: Secondary | ICD-10-CM | POA: Diagnosis not present

## 2020-10-22 DIAGNOSIS — I110 Hypertensive heart disease with heart failure: Secondary | ICD-10-CM | POA: Diagnosis not present

## 2020-10-22 DIAGNOSIS — E1140 Type 2 diabetes mellitus with diabetic neuropathy, unspecified: Secondary | ICD-10-CM | POA: Diagnosis not present

## 2020-10-22 DIAGNOSIS — E785 Hyperlipidemia, unspecified: Secondary | ICD-10-CM | POA: Diagnosis not present

## 2020-10-22 DIAGNOSIS — I700 Atherosclerosis of aorta: Secondary | ICD-10-CM | POA: Diagnosis not present

## 2020-10-22 DIAGNOSIS — I2510 Atherosclerotic heart disease of native coronary artery without angina pectoris: Secondary | ICD-10-CM | POA: Diagnosis not present

## 2020-11-24 DIAGNOSIS — M19072 Primary osteoarthritis, left ankle and foot: Secondary | ICD-10-CM | POA: Diagnosis not present

## 2020-11-24 DIAGNOSIS — M76822 Posterior tibial tendinitis, left leg: Secondary | ICD-10-CM | POA: Diagnosis not present

## 2020-11-24 DIAGNOSIS — I890 Lymphedema, not elsewhere classified: Secondary | ICD-10-CM | POA: Diagnosis not present

## 2020-11-24 DIAGNOSIS — M25572 Pain in left ankle and joints of left foot: Secondary | ICD-10-CM | POA: Diagnosis not present

## 2020-12-03 ENCOUNTER — Ambulatory Visit: Payer: Medicare PPO | Admitting: Family Medicine

## 2020-12-06 ENCOUNTER — Telehealth: Payer: Self-pay | Admitting: Family Medicine

## 2020-12-06 ENCOUNTER — Ambulatory Visit: Payer: Medicare PPO | Admitting: Family Medicine

## 2020-12-06 NOTE — Progress Notes (Deleted)
  Subjective:    Patient ID: Kayla [name], female    DOB: 1940-08-22, 80 y.o.   MRN: [id number]  Kayla [name] is a 80 y.o. female who presents for follow-up of Type 2 diabetes mellitus.  Home blood sugar records: {diabetes glucometry results:16657} Current symptoms/problems include {Symptoms; diabetes:14075} and have been {Desc; course:15616}. Daily foot checks:   Any foot concerns: *** Exercise: {types:19826} Diet: The following portions of the patient's history were reviewed and updated as appropriate: allergies, current medications, past medical history, past social history and problem list.  ROS as in subjective above.     Objective:    Physical Exam Alert and in no distress otherwise not examined.  There were no vitals taken for this visit.  Lab Review Diabetic Labs Latest Ref Rng & Units 06/01/2020 08/21/2019 04/21/2019 12/18/2018 07/09/2018  HbA1c 4.0 - 5.6 % 6.5(A) 6.5(A) - 6.6(A) -  Chol 100 - 199 mg/dL 186 - 177 272(H) -  HDL >39 mg/dL 51 - 51 59 -  Calc LDL 0 - 99 mg/dL 118(H) - 108(H) 194(H) -  Triglycerides 0 - 149 mg/dL 94 - 96 93 -  Creatinine 0.57 - 1.00 mg/dL 0.98 - 0.90 - 0.85   BP/Weight 10/13/2020 07/08/2020 06/01/2020 04/21/2020 5/59/7416  Systolic BP 384 536 468 032 122  Diastolic BP 55 80 84 88 86  Wt. (Lbs) - 245.2 232.6 256.6 250.2  BMI - 42.75 40.56 44.74 43.63   Foot/eye exam completion dates Latest Ref Rng & Units 06/18/2020 12/18/2018  Eye Exam No Retinopathy No Retinopathy -  Foot Form Completion - - Done    Kayla [name]  reports that she has never smoked. She has never used smokeless tobacco. She reports that she does not drink alcohol and does not use drugs.     Assessment & Plan:    No diagnosis found.  1. Rx changes: {none:33079} 2. Education: Reviewed 'ABCs' of diabetes management (respective goals in parentheses):  A1C (<7), blood pressure (<130/80), and cholesterol (LDL <100). 3. Compliance at present is estimated to be  {good/fair/poor:33178}. Efforts to improve compliance (if necessary) will be directed at {compliance:16716}. 4. Follow up: {NUMBERS; 0-10:33138} {time:11}

## 2020-12-06 NOTE — Telephone Encounter (Signed)
Pt called and states that her leg is extremely painful and she cant not walk on it. She cancelled her appt for today. When asked about being seen for her leg she sates Dr. Redmond [facility] can't help her. I questioned her further and she states that she is seeing someone else for leg but nobody seems to know what is going on. Pt seemed irritated and confused about the care of her leg. Pt can be reached at [phone].

## 2020-12-07 NOTE — Telephone Encounter (Signed)
Pt feel as if nothing was done for her concerning her leg. Pt was reminded th we did all we could with trying to find her home health for her wound and no agency would except her insurance and that wound care also would not see pt. I asked if this had been going on since last year (leg pain) and pt stated no. I advise pt that we will try and follow up with her at her upcoming appointment 12/09/20. [place]

## 2020-12-09 ENCOUNTER — Ambulatory Visit (INDEPENDENT_AMBULATORY_CARE_PROVIDER_SITE_OTHER): Payer: Medicare PPO | Admitting: Family Medicine

## 2020-12-09 ENCOUNTER — Other Ambulatory Visit: Payer: Self-pay

## 2020-12-09 ENCOUNTER — Encounter: Payer: Self-pay | Admitting: Family Medicine

## 2020-12-09 VITALS — BP 138/70 | HR 90 | Temp 99.4°F | Ht 64.0 in | Wt 230.2 lb

## 2020-12-09 DIAGNOSIS — I890 Lymphedema, not elsewhere classified: Secondary | ICD-10-CM | POA: Diagnosis not present

## 2020-12-09 DIAGNOSIS — E1140 Type 2 diabetes mellitus with diabetic neuropathy, unspecified: Secondary | ICD-10-CM | POA: Diagnosis not present

## 2020-12-09 DIAGNOSIS — E1169 Type 2 diabetes mellitus with other specified complication: Secondary | ICD-10-CM

## 2020-12-09 DIAGNOSIS — I152 Hypertension secondary to endocrine disorders: Secondary | ICD-10-CM | POA: Diagnosis not present

## 2020-12-09 DIAGNOSIS — E785 Hyperlipidemia, unspecified: Secondary | ICD-10-CM | POA: Diagnosis not present

## 2020-12-09 DIAGNOSIS — E1159 Type 2 diabetes mellitus with other circulatory complications: Secondary | ICD-10-CM | POA: Diagnosis not present

## 2020-12-09 DIAGNOSIS — L03119 Cellulitis of unspecified part of limb: Secondary | ICD-10-CM

## 2020-12-09 LAB — POCT GLYCOSYLATED HEMOGLOBIN (HGB A1C): Hemoglobin A1C: 6.3 % — AB (ref 4.0–5.6)

## 2020-12-09 MED ORDER — DICLOFENAC SODIUM 75 MG PO TBEC: 75.0000 mg | DELAYED_RELEASE_TABLET | Freq: Two times a day (BID) | ORAL | 0 refills | Status: DC

## 2020-12-09 MED ORDER — DOXYCYCLINE HYCLATE 100 MG PO TABS: 100.0000 mg | ORAL_TABLET | Freq: Two times a day (BID) | ORAL | 0 refills | Status: DC

## 2020-12-09 NOTE — Patient Instructions (Signed)
Pain take the diclofenac twice per day and you can also take 2 Tylenol 4 times per day to help control your pain.  If that does not work call me in about a week and I will talk again.  Number to call in an antibiotic

## 2020-12-09 NOTE — Progress Notes (Signed)
Subjective:    Patient ID: Kayla [name], female    DOB: [birth year], 80 y.o.   MRN: [id number]  Kayla [name] is a 80 y.o. female who presents for follow-up of Type 2 diabetes mellitus. Home blood sugar records: meter records, fasting, 98-158 Current symptoms/problems include wound on left leg not healing. Daily foot checks: yes   Any foot concerns: none per pt but will see podiatry soon Exercise: N/A due to pain Diet:good and fair She has been having increasing difficulty with leg pain and swelling mainly on the left.  She had problems with this in the past and we tried to get home health involved but could not get anybody to except her insurance.  In the past she did have difficulty with cellulitis in that area.  She complains of pain in the lateral posterior calf area and also into the foot area.  She continues on her atorvastatin, Coreg, topical Voltaren and is also using Mobic for pain relief.  She is also taking losartan without difficulty.  500 mg of metformin is also being used. The following portions of the patient's history were reviewed and updated as appropriate: allergies, current medications, past medical history, past social history and problem list.  ROS as in subjective above.     Objective:     Alert and in no distress   .  Exam of her left lower extremity does show swelling and warmth and tender to palpation to the distal left calf area and also over the dorsum of the foot. Hemoglobin A1c is 6.3.  Height 5\' 4"  (1.626 m), weight 230 lb 3.2 oz (104.4 kg).  Lab Review Diabetic Labs Latest Ref Rng & Units 06/01/2020 08/21/2019 04/21/2019 12/18/2018 07/09/2018  HbA1c 4.0 - 5.6 % 6.5(A) 6.5(A) - 6.6(A) -  Chol 100 - 199 mg/dL 186 - 177 272(H) -  HDL >39 mg/dL 51 - 51 59 -  Calc LDL 0 - 99 mg/dL 118(H) - 108(H) 194(H) -  Triglycerides 0 - 149 mg/dL 94 - 96 93 -  Creatinine 0.57 - 1.00 mg/dL 0.98 - 0.90 - 0.85   BP/Weight 12/09/2020 10/13/2020 07/08/2020 06/01/2020 3/55/7322   Systolic BP - 025 427 062 376  Diastolic BP - 55 80 84 88  Wt. (Lbs) 230.2 - 245.2 232.6 256.6  BMI 39.51 - 42.75 40.56 44.74   Foot/eye exam completion dates Latest Ref Rng & Units 06/18/2020 12/18/2018  Eye Exam No Retinopathy No Retinopathy -  Foot Form Completion - - Done    Kayla [name]  reports that she has never smoked. She has never used smokeless tobacco. She reports that she does not drink alcohol and does not use drugs.     Assessment & Plan:    Type 2 diabetes mellitus with diabetic neuropathy, without long-term current use of insulin ([place]) - Plan: POCT glycosylated hemoglobin (Hb A1C)  Morbid obesity ([facility])  Hyperlipidemia associated with type 2 diabetes mellitus ([place])  Hypertension associated with diabetes ([place])  Lymphedema  Cellulitis of calf - Plan: diclofenac (VOLTAREN) 75 MG EC tablet, doxycycline (VIBRA-TABS) 100 MG tablet   1. Rx changes: Doxycycline   2. Education: Reviewed 'ABCs' of diabetes management (respective goals in parentheses):  A1C (<7), blood pressure (<130/80), and cholesterol (LDL <100). 3. Compliance at present is estimated to be good. Efforts to improve compliance (if necessary) will be directed at No change. 4. Follow up: 4 months We will attempt to get her involved in home health as well.  We would like to  have someone to help with the lymphedema by getting her stockings on as she has a difficult time putting them on herself.  Recheck here in 1 month.  Also switch to Voltaren.  And stop Mobic

## 2020-12-10 ENCOUNTER — Other Ambulatory Visit: Payer: Self-pay | Admitting: Family Medicine

## 2020-12-10 ENCOUNTER — Ambulatory Visit: Payer: Medicare PPO | Admitting: Podiatry

## 2020-12-10 ENCOUNTER — Other Ambulatory Visit: Payer: Self-pay

## 2020-12-10 DIAGNOSIS — E1140 Type 2 diabetes mellitus with diabetic neuropathy, unspecified: Secondary | ICD-10-CM

## 2020-12-10 DIAGNOSIS — L03119 Cellulitis of unspecified part of limb: Secondary | ICD-10-CM

## 2020-12-10 DIAGNOSIS — I890 Lymphedema, not elsewhere classified: Secondary | ICD-10-CM

## 2020-12-10 NOTE — Progress Notes (Signed)
ambulatory

## 2020-12-17 ENCOUNTER — Other Ambulatory Visit (HOSPITAL_COMMUNITY): Payer: Self-pay | Admitting: Internal Medicine

## 2020-12-17 ENCOUNTER — Other Ambulatory Visit: Payer: Self-pay | Admitting: Internal Medicine

## 2020-12-17 ENCOUNTER — Other Ambulatory Visit: Payer: Self-pay

## 2020-12-17 ENCOUNTER — Encounter (HOSPITAL_BASED_OUTPATIENT_CLINIC_OR_DEPARTMENT_OTHER): Payer: Medicare PPO | Attending: Internal Medicine | Admitting: Internal Medicine

## 2020-12-17 DIAGNOSIS — E119 Type 2 diabetes mellitus without complications: Secondary | ICD-10-CM | POA: Insufficient documentation

## 2020-12-17 DIAGNOSIS — L97819 Non-pressure chronic ulcer of other part of right lower leg with unspecified severity: Secondary | ICD-10-CM

## 2020-12-17 DIAGNOSIS — I890 Lymphedema, not elsewhere classified: Secondary | ICD-10-CM

## 2020-12-17 DIAGNOSIS — L03116 Cellulitis of left lower limb: Secondary | ICD-10-CM

## 2020-12-17 DIAGNOSIS — L89899 Pressure ulcer of other site, unspecified stage: Secondary | ICD-10-CM | POA: Diagnosis not present

## 2020-12-17 DIAGNOSIS — I872 Venous insufficiency (chronic) (peripheral): Secondary | ICD-10-CM

## 2020-12-17 DIAGNOSIS — R2242 Localized swelling, mass and lump, left lower limb: Secondary | ICD-10-CM

## 2020-12-17 NOTE — Progress Notes (Signed)
Kayla [name] ([id number]) Visit Report for 12/17/2020 Chief Complaint Document Details Patient Name: Date of Service: Kayla [name], [name] 12/17/2020 7:30 A M Medical Record Number: [id number] Patient Account Number: [id number] Date of Birth/Sex: Treating RN: 1941/05/01 (80 y.o. Kayla [name] Primary Care Provider: Jill [name] Other Clinician: Referring Provider: Treating Provider/Extender: Kayla [name]: 0 Information Obtained from: Patient Chief Complaint Right lower extremity wound with bilateral lower extremity swelling and pain to the anterior left leg Electronic Signature(s) Signed: 12/17/2020 10:04:30 AM By: Kayla Shan [name] Entered By: Kayla [name] on 12/17/2020 09:48:30 -------------------------------------------------------------------------------- Debridement Details Patient Name: Date of Service: Kayla [name], Kayla A. 12/17/2020 7:30 A M Medical Record Number: [id number] Patient Account Number: [id number] Date of Birth/Sex: Treating RN: Dec 09, 1940 (80 y.o. Kayla [name] Primary Care Provider: Jill [name] Other Clinician: Referring Provider: Treating Provider/Extender: Kayla [name]: 0 Debridement Performed for Assessment: Wound #1 Right,Medial Lower Leg Performed By: Physician Kayla Shan, [name] Debridement Type: Debridement Severity of Tissue Pre Debridement: Fat layer exposed Level of Consciousness (Pre-procedure): Awake and Alert Pre-procedure Verification/Time Out Yes - 08:30 Taken: Start Time: 08:31 T Area Debrided (L x W): otal 0.3 (cm) x 0.3 (cm) = 0.09 (cm) Tissue and other material debrided: Non-Viable, Skin: Dermis Level: Skin/Dermis Debridement Description: Selective/Open Wound Instrument: Other : Gauze Bleeding: None End Time: 08:33 Response to [name]: Procedure was tolerated well Level of Consciousness (Post- Awake and Alert procedure): Post Debridement  Measurements of Total Wound Length: (cm) 0.3 Width: (cm) 0.3 Depth: (cm) 0.1 Volume: (cm) 0.007 Character of Wound/Ulcer Post Debridement: Stable Severity of Tissue Post Debridement: Fat layer exposed Post Procedure Diagnosis Same as Pre-procedure Electronic Signature(s) Signed: 12/17/2020 9:53:26 AM By: Kayla [name] Signed: 12/17/2020 10:04:30 AM By: Kayla Shan [name] Entered By: Kayla [name] on 12/17/2020 09:53:26 -------------------------------------------------------------------------------- HPI Details Patient Name: Date of Service: Kayla [name], Kayla A. 12/17/2020 7:30 A M Medical Record Number: [id number] Patient Account Number: [id number] Date of Birth/Sex: Treating RN: 1941-03-26 (80 y.o. Kayla [name] Primary Care Provider: Jill [name] Other Clinician: Referring Provider: Treating Provider/Extender: Kayla [name]: 0 History of Present Illness HPI Description: Admission 5/20 Ms. Kayla [name] is an 80 year old female with a past medical history of type 2 diabetes on oral agents, chronic venous insufficiency and lymphedema that presents to our clinic for bilateral lower extremity swelling with a small open wound to the right lower extremity. She states that her swelling has increased over the past year. She does not use compression stockings although she does own some. She states these are too hard to get on. She tells me she does not have a history of open wounds however in the EMR it is noted that she has had several cases of open wounds to her legs. She reports pain to shin of her left lower extremity. She is currently on doxycycline for cellulitis of her calf. She states that the pain to her shin has been present for the past 6 months and has not gotten better. There are no open wounds to the left lower extremity. Electronic Signature(s) Signed: 12/17/2020 10:04:30 AM By: Kayla Shan [name] Entered By: Kayla [name] on  12/17/2020 09:50:24 -------------------------------------------------------------------------------- Physical Exam Details Patient Name: Date of Service: Kayla [name], Kayla A. 12/17/2020 7:30 A M Medical Record Number: [id number] Patient Account Number: [id number] Date of Birth/Sex: Treating RN: 24-Mar-1941 (80 y.o. Kayla [name] Primary Care Provider: Jill [name] Other Clinician: Referring Provider: Treating Provider/Extender: Kayla [name]  Kayla PointerJessica [name], Kayla [name] in [name]: 0 Constitutional respirations regular, non-labored and within target range for patient.. Cardiovascular 2+ dorsalis pedis/posterior tibialis pulses. Psychiatric pleasant and cooperative. Notes Right lower extremity: On the medial lower aspect there is a small open wound limited to skin breakdown with granulation tissue present. No drainage noted. Left lower extremity: There are no open wounds however she does have an area of fluctuance to her anterior shin that is erythematous and painful. No drainage noted. 2+ pitting edema to the knees bilaterally. Venous stasis and lymphedema skin changes present bilaterally. Electronic Signature(s) Signed: 12/17/2020 10:04:30 AM By: Kayla [name] Entered By: Kayla [name] on 12/17/2020 09:54:44 -------------------------------------------------------------------------------- Physician Orders Details Patient Name: Date of Service: [name] 12/17/2020 7:30 A M Medical Record Number: [id number] Patient Account Number: [id number] Date of Birth/Sex: Treating RN: 07/31/1940 (80 y.o. Roel CluckF) Barnhart, Jodi Primary Care Provider: Sharlot GowdaLalonde, Kayla Other Clinician: Referring Provider: Treating Provider/Extender: Christella ScheuermannHoffman, Evan Osburn [name], Kayla [name] in [name]: 0 Verbal / Phone Orders: No Diagnosis Coding ICD-10 Coding Code Description L97.819 Non-pressure chronic ulcer of other part of right lower leg with unspecified severity L03.116 Cellulitis of left lower  limb I89.0 Lymphedema, not elsewhere classified I87.2 Venous insufficiency (chronic) (peripheral) E11.9 Type 2 diabetes mellitus without complications Follow-up Appointments ppointment in 1 week. - with Dr. Mikey BussingHoffman Return A Edema Control - Lymphedema / SCD / Other Other Edema Control Orders/Instructions: - Juxtalite CircAid ordered for right leg. Additional Orders / Instructions Follow Nutritious Diet Wound [name] Wound #1 - Lower Leg Wound Laterality: Right, Medial Cleanser: Soap and Water 1 x Per Day/15 Days Discharge Instructions: May shower and wash wound with dial antibacterial soap and water prior to dressing change. Prim Dressing: FIBRACOL Plus Dressing, 2x2 in (collagen) (DME) (Generic) 1 x Per Day/15 Days ary Discharge Instructions: Moisten collagen with saline or hydrogel Secondary Dressing: Bordered Gauze, 2x2 in (DME) (Generic) 1 x Per Day/15 Days Discharge Instructions: Apply over primary dressing as directed. Secondary Dressing: saline (DME) (Generic) 1 x Per Day/15 Days Services and Therapies nkle Brachial Index (ABI)/TBI:Bilateral - Referral made to [facility] Ultrasound of left lower leg - **STAT** - Nodule left lower leg, rule out abscess Notes [facility] for Supplies Electronic Signature(s) Signed: 12/17/2020 10:04:30 AM By: Kayla [name], Kayla Soules [name] Previous Signature: 12/17/2020 9:16:57 AM Version By: Antonieta IbaBarnhart, Jodi Entered By: Kayla [name], Quinto Tippy on 12/17/2020 10:03:24 Prescription 12/17/2020 -------------------------------------------------------------------------------- Kayla [name], Kayla A. Kayla [name], Mikeya Tomasetti [name] Patient Name: Provider: 07/19/1941 [phone] Date of Birth: NPI#: [id number] Sex: DEA #: [phone] Phone #: License #: [facility] Patient Address: [address] [place], [place] [address] [address] [place], [place] [address] [phone] Allergies No Known  Allergies Provider's Orders Ultrasound of left lower leg - **STAT** - Nodule left lower leg, rule out abscess Hand Signature: Date(s): Electronic Signature(s) Signed: 12/17/2020 10:04:30 AM By: Kayla [name], Jamyia Fortune [name] Entered By: Kayla [name], Monty Spicher on 12/17/2020 10:03:24 -------------------------------------------------------------------------------- Problem List Details Patient Name: Date of Service: Kayla [name], Kayla A. 12/17/2020 7:30 A M Medical Record Number: [id number] Patient Account Number: [id number] Date of Birth/Sex: Treating RN: 04/23/1941 (80 y.o. Roel CluckF) Barnhart, Jodi Primary Care Provider: Sharlot GowdaLalonde, Kayla Other Clinician: Referring Provider: Treating Provider/Extender: Christella ScheuermannHoffman, Veto Macqueen [name], Kayla [name] in [name]: 0 Active Problems ICD-10 Encounter Code Description Active Date MDM Diagnosis L97.819 Non-pressure chronic ulcer of other part of right lower leg with unspecified 12/17/2020 No Yes severity L03.116 Cellulitis of left lower limb 12/17/2020 No Yes I89.0 Lymphedema, not elsewhere  classified 12/17/2020 No Yes I87.2 Venous insufficiency (chronic) (peripheral) 12/17/2020 No Yes E11.9 Type 2 diabetes mellitus without complications 01/18/3085 No Yes Inactive Problems Resolved Problems Electronic Signature(s) Signed: 12/17/2020 10:04:30 AM By: Kayla Shan [name] Entered By: Kayla [name] on 12/17/2020 10:03:03 -------------------------------------------------------------------------------- Progress Note Details Patient Name: Date of Service: Kayla [name], Kayla A. 12/17/2020 7:30 A M Medical Record Number: [id number] Patient Account Number: [id number] Date of Birth/Sex: Treating RN: 1941-04-21 (79 y.o. Kayla [name] Primary Care Provider: Jill [name] Other Clinician: Referring Provider: Treating Provider/Extender: Kayla [name]: 0 Subjective Chief Complaint Information obtained from Patient Right lower extremity wound with  bilateral lower extremity swelling and pain to the anterior left leg History of Present Illness (HPI) Admission 5/20 Ms. Shterna Laramee is an 80 year old female with a past medical history of type 2 diabetes on oral agents, chronic venous insufficiency and lymphedema that presents to our clinic for bilateral lower extremity swelling with a small open wound to the right lower extremity. She states that her swelling has increased over the past year. She does not use compression stockings although she does own some. She states these are too hard to get on. She tells me she does not have a history of open wounds however in the EMR it is noted that she has had several cases of open wounds to her legs. She reports pain to shin of her left lower extremity. She is currently on doxycycline for cellulitis of her calf. She states that the pain to her shin has been present for the past 6 months and has not gotten better. There are no open wounds to the left lower extremity. Patient History Information obtained from Patient. Allergies No Known Allergies Family History Unknown History. Social History Never smoker, Marital Status - Widowed, Alcohol Use - Never, Drug Use - No History, Caffeine Use - Moderate. Medical History Eyes Denies history of Cataracts, Glaucoma, Optic Neuritis Ear/Nose/Mouth/Throat Denies history of Chronic sinus problems/congestion, Middle ear problems Hematologic/Lymphatic Patient has history of Lymphedema Denies history of Anemia, Hemophilia, Human Immunodeficiency Virus, Sickle Cell Disease Respiratory Denies history of Aspiration, Asthma, Chronic Obstructive Pulmonary Disease (COPD), Pneumothorax, Sleep Apnea, Tuberculosis Cardiovascular Patient has history of Hypertension Denies history of Angina, Arrhythmia, Congestive Heart Failure, Coronary Artery Disease, Deep Vein Thrombosis, Hypotension, Myocardial Infarction, Peripheral Arterial Disease, Peripheral Venous Disease,  Phlebitis, Vasculitis Gastrointestinal Denies history of Cirrhosis , Colitis, Crohnoos, Hepatitis A, Hepatitis B, Hepatitis C Endocrine Patient has history of Type II Diabetes Denies history of Type I Diabetes Genitourinary Denies history of End Stage Renal Disease Immunological Denies history of Lupus Erythematosus, Raynaudoos, Scleroderma Integumentary (Skin) Denies history of History of Burn Musculoskeletal Patient has history of Rheumatoid Arthritis, Osteoarthritis Denies history of Gout, Osteomyelitis Neurologic Patient has history of Neuropathy Denies history of Dementia, Quadriplegia, Paraplegia, Seizure Disorder Oncologic Denies history of Received Chemotherapy, Received Radiation Medical A Surgical History Notes nd Cardiovascular hyperlipidemia Gastrointestinal GERD Review of Systems (ROS) Constitutional Symptoms (General Health) Denies complaints or symptoms of Fatigue, Fever, Chills, Marked Weight Change. Eyes Denies complaints or symptoms of Dry Eyes, Vision Changes, Glasses / Contacts. Ear/Nose/Mouth/Throat Denies complaints or symptoms of Chronic sinus problems or rhinitis. Respiratory Denies complaints or symptoms of Chronic or frequent coughs, Shortness of Breath. Cardiovascular Denies complaints or symptoms of Chest pain. Gastrointestinal Denies complaints or symptoms of Frequent diarrhea, Nausea, Vomiting. Endocrine Denies complaints or symptoms of Heat/cold intolerance. Genitourinary Denies complaints or symptoms of Frequent urination. Musculoskeletal Denies complaints or symptoms of Muscle Pain,  Muscle Weakness. Neurologic Denies complaints or symptoms of Numbness/parasthesias. Psychiatric Denies complaints or symptoms of Claustrophobia, Suicidal. Objective Constitutional respirations regular, non-labored and within target range for patient.. Vitals Time Taken: 8:07 AM, Height: 63 in, Source: Stated, Weight: 250 lbs, Source: Stated, BMI: 44.3,  Temperature: 98.7 F, Pulse: 83 bpm, Respiratory Rate: 17 breaths/min, Blood Pressure: 141/74 mmHg, Capillary Blood Glucose: 112 mg/dl. Cardiovascular 2+ dorsalis pedis/posterior tibialis pulses. Psychiatric pleasant and cooperative. General Notes: Right lower extremity: On the medial lower aspect there is a small open wound limited to skin breakdown with granulation tissue present. No drainage noted. Left lower extremity: There are no open wounds however she does have an area of fluctuance to her anterior shin that is erythematous and painful. No drainage noted. 2+ pitting edema to the knees bilaterally. Venous stasis and lymphedema skin changes present bilaterally. Integumentary (Hair, Skin) Wound #1 status is Open. Original cause of wound was Blister. The date acquired was: 01/02/2020. The wound is located on the Right,Medial Lower Leg. The wound measures 0.3cm length x 0.3cm width x 0.1cm depth; 0.071cm^2 area and 0.007cm^3 volume. There is no tunneling or undermining noted. There is a medium amount of serosanguineous drainage noted. The wound margin is distinct with the outline attached to the wound base. There is large (67-100%) red, pink granulation within the wound bed. There is a small (1-33%) amount of necrotic tissue within the wound bed including Adherent Slough. Assessment Active Problems ICD-10 Non-pressure chronic ulcer of other part of right lower leg with unspecified severity Lymphedema, not elsewhere classified Venous insufficiency (chronic) (peripheral) Type 2 diabetes mellitus without complications Patient presents with a right lower extremity wound and area of fluctuance on her left anterior shin. On physical exam patient has signs of lymphedema and chronic venous insufficiency. She would benefit greatly from compression therapy for preventing future wounds. She currently has compression stockings but are too difficult to put on. We will order juxta lite compression. The  right lower extremity wound is very small and she was unaware that it was there. She is mainly concerned about the left lower extremity shin pain. I think for the right lower extremity wound she would be just fine with collagen daily. Her ABIs were noncompressible and I would like to order formal studies. Unless her wound progresses at this time I am not going to put her under compression therapy as I think it will cause more issues. For her left lower extremity I am concerned there could be an abscess and I am ordering a stat ultrasound. She overall feels well and vitals are stable. I recommended she finish her course of doxycycline that was prescribed by her PCP. There are no open wounds at this time on the left leg. I will see her in follow-up in 1 week. Procedures Wound #1 Pre-procedure diagnosis of Wound #1 is a Venous Leg Ulcer located on the Right,Medial Lower Leg .Severity of Tissue Pre Debridement is: Fat layer exposed. There was a Selective/Open Wound Skin/Dermis Debridement with a total area of 0.09 sq cm performed by Kayla Shan, [name]. With the following instrument(s): Gauze to remove Non-Viable tissue/material. Material removed includes Skin: Dermis. No specimens were taken. A time out was conducted at 08:30, prior to the start of the procedure. There was no bleeding. The procedure was tolerated well. Post Debridement Measurements: 0.3cm length x 0.3cm width x 0.1cm depth; 0.007cm^3 volume. Character of Wound/Ulcer Post Debridement is stable. Severity of Tissue Post Debridement is: Fat layer exposed. Post procedure Diagnosis Wound #  1: Same as Pre-Procedure Plan Follow-up Appointments: Return Appointment in 1 week. - with Dr. Heber [place] Edema Control - Lymphedema / SCD / Other: Other Edema Control Orders/Instructions: - Juxtalite CircAid ordered for right leg. Additional Orders / Instructions: Follow Nutritious Diet Services and Therapies ordered were: Ankle Brachial Index  (ABI)/TBI:Bilateral - Referral made to [facility] ordered were: Ultrasound of left lower leg - **STAT** - Nodule left lower leg, rule out abscess General Notes: [facility] Supp[facility] WOUND #1: - Lower Leg Wound Laterality: Right, Medial Cleanser: Soap and Water 1 x Per Day/15 Days Discharge Instructions: May shower and wash wound with dial antibacterial soap and water prior to dressing change. Prim Dressing: FIBRACOL Plus Dressing, 2x2 in (collagen) (DME) (Generic) 1 x Per Day/15 Days ary Discharge Instructions: Moisten collagen with saline or hydrogel Secondary Dressing: Bordered Gauze, 2x2 in (DME) (Generic) 1 x Per Day/15 Days Discharge Instructions: Apply over primary dressing as directed. Secondary Dressing: saline (DME) (Generic) 1 x Per Day/15 Days 1. Collagen daily 2. Stat ultrasound 3. Follow-up in 1 week Electronic Signature(s) Signed: 12/17/2020 10:04:30 AM By: Kayla Shan [name] Entered By: Kayla [name] on 12/17/2020 10:02:28 -------------------------------------------------------------------------------- HxROS Details Patient Name: Date of Service: Kayla [name], Kayla A. 12/17/2020 7:30 A M Medical Record Number: [id number] Patient Account Number: [id number] Date of Birth/Sex: Treating RN: [birth year] (80 y.o. [name], [name] Primary Care Provider: Jill [name] Other Clinician: Referring Provider: Treating Provider/Extender: Kayla [name]: 0 Information Obtained From Patient Constitutional Symptoms (General Health) Complaints and Symptoms: Negative for: Fatigue; Fever; Chills; Marked Weight Change Eyes Complaints and Symptoms: Negative for: Dry Eyes; Vision Changes; Glasses / Contacts Medical History: Negative for: Cataracts; Glaucoma; Optic Neuritis Ear/Nose/Mouth/Throat Complaints and Symptoms: Negative for: Chronic sinus problems or rhinitis Medical History: Negative for: Chronic sinus problems/congestion; Middle ear  problems Respiratory Complaints and Symptoms: Negative for: Chronic or frequent coughs; Shortness of Breath Medical History: Negative for: Aspiration; Asthma; Chronic Obstructive Pulmonary Disease (COPD); Pneumothorax; Sleep Apnea; Tuberculosis Cardiovascular Complaints and Symptoms: Negative for: Chest pain Medical History: Positive for: Hypertension Negative for: Angina; Arrhythmia; Congestive Heart Failure; Coronary Artery Disease; Deep Vein Thrombosis; Hypotension; Myocardial Infarction; Peripheral Arterial Disease; Peripheral Venous Disease; Phlebitis; Vasculitis Past Medical History Notes: hyperlipidemia Gastrointestinal Complaints and Symptoms: Negative for: Frequent diarrhea; Nausea; Vomiting Medical History: Negative for: Cirrhosis ; Colitis; Crohns; Hepatitis A; Hepatitis B; Hepatitis C Past Medical History Notes: GERD Endocrine Complaints and Symptoms: Negative for: Heat/cold intolerance Medical History: Positive for: Type II Diabetes Negative for: Type I Diabetes Genitourinary Complaints and Symptoms: Negative for: Frequent urination Medical History: Negative for: End Stage Renal Disease Musculoskeletal Complaints and Symptoms: Negative for: Muscle Pain; Muscle Weakness Medical History: Positive for: Rheumatoid Arthritis; Osteoarthritis Negative for: Gout; Osteomyelitis Neurologic Complaints and Symptoms: Negative for: Numbness/parasthesias Medical History: Positive for: Neuropathy Negative for: Dementia; Quadriplegia; Paraplegia; Seizure Disorder Psychiatric Complaints and Symptoms: Negative for: Claustrophobia; Suicidal Hematologic/Lymphatic Medical History: Positive for: Lymphedema Negative for: Anemia; Hemophilia; Human Immunodeficiency Virus; Sickle Cell Disease Immunological Medical History: Negative for: Lupus Erythematosus; Raynauds; Scleroderma Integumentary (Skin) Medical History: Negative for: History of Burn Oncologic Medical  History: Negative for: Received Chemotherapy; Received Radiation Immunizations Pneumococcal Vaccine: Received Pneumococcal Vaccination: Yes Implantable Devices None Family and Social History Unknown History: Yes; Never smoker; Marital Status - Widowed; Alcohol Use: Never; Drug Use: No History; Caffeine Use: Moderate; Financial Concerns: No; Food, Clothing or Shelter Needs: No; Support System Lacking: No; Transportation Concerns: No Electronic Signature(s) Signed: 12/17/2020 10:04:30 AM By: Kayla Shan [name] Signed:  12/17/2020 5:22:38 PM By: Rhae Hammock RN Entered By: Rhae Hammock on 12/17/2020 08:13:05 -------------------------------------------------------------------------------- SuperBill Details Patient Name: Date of Service: Kayla [name], Refugio A. 12/17/2020 Medical Record Number: [id number] Patient Account Number: [id number] Date of Birth/Sex: Treating RN: October 09, 1940 (80 y.o. Kayla [name] Primary Care Provider: Jill [name] Other Clinician: Referring Provider: Treating Provider/Extender: Kayla [name]: 0 Diagnosis Coding ICD-10 Codes Code Description [phone] Non-pressure chronic ulcer of other part of right lower leg with unspecified severity L03.116 Cellulitis of left lower limb I89.0 Lymphedema, not elsewhere classified I87.2 Venous insufficiency (chronic) (peripheral) E11.9 Type 2 diabetes mellitus without complications Facility Procedures CPT4 Code: [id number] Description: [id number] - WOUND CARE VISIT-LEV 3 EST PT Modifier: 25 Quantity: 1 CPT4 Code: [id number] Description: [id number] - DEBRIDE WOUND 1ST 20 SQ CM OR < ICD-10 Diagnosis Description L97.819 Non-pressure chronic ulcer of other part of right lower leg with unspecified seve Modifier: rity Quantity: 1 Physician Procedures : CPT4 Code Description Modifier [id number] - WC PHYS LEVEL 4 - NEW PT ICD-10 Diagnosis Description L97.819 Non-pressure chronic ulcer of other  part of right lower leg with unspecified severity L03.116 Cellulitis of left lower limb I89.0 Lymphedema,  not elsewhere classified I87.2 Venous insufficiency (chronic) (peripheral) Quantity: 1 : [id number] - WC PHYS DEBR WO ANESTH 20 SQ CM ICD-10 Diagnosis Description L97.819 Non-pressure chronic ulcer of other part of right lower leg with unspecified severity Quantity: 1 Electronic Signature(s) Signed: 12/17/2020 10:04:30 AM By: Kayla Shan [name] Previous Signature: 12/17/2020 9:54:26 AM Version By: Kayla [name] Entered By: Kayla [name] on 12/17/2020 10:04:04

## 2020-12-17 NOTE — Progress Notes (Addendum)
VY, BADLEY ([id number]) Visit Report for 12/17/2020 Allergy List Details Patient Name: Date of Service: Kayla [name] NES, [name] 12/17/2020 7:30 A M Medical Record Number: [id number] Patient Account Number: [id number] Date of Birth/Sex: Treating RN: July 22, 1941 (80 y.o. Female) Kayla [name] Primary Care Bobie Caris: Jill Alexanders Other Clinician: Referring Yanixan Mellinger: Treating Duaa Stelzner/Extender: Kayla [name] in Treatment: 0 Allergies Active Allergies No Known Allergies Allergy Notes Electronic Signature(s) Signed: 12/17/2020 5:22:38 PM By: Kayla Hammock RN Entered By: Kayla [name] on 12/17/2020 08:09:35 -------------------------------------------------------------------------------- Arrival Information Details Patient Name: Date of Service: Kayla [name] NES, [facility] A. 12/17/2020 7:30 A M Medical Record Number: [id number] Patient Account Number: [id number] Date of Birth/Sex: Treating RN: [birth year] (80 y.o. Female) Kayla [name] Primary Care Kayla [name]: Jill Alexanders Other Clinician: Referring Kayla [name]: Treating Darrius [name]/Extender: Kayla [name] in Treatment: 0 Visit Information Patient Arrived: Wheel Chair Arrival Time: 07:54 Accompanied By: self Transfer Assistance: None Patient Identification Verified: Yes Secondary Verification Process Completed: Yes Patient Requires Transmission-Based Precautions: No Patient Has Alerts: Yes Patient Alerts: R ABI= Electronic Signature(s) Signed: 12/17/2020 5:20:22 PM By: Kayla [name] Entered By: Kayla [name] on 12/17/2020 08:56:18 -------------------------------------------------------------------------------- Clinic Level of Care Assessment Details Patient Name: Date of Service: [name], [name] A. 12/17/2020 7:30 A M Medical Record Number: [id number] Patient Account Number: [id number] Date of Birth/Sex: Treating RN: [birth year] (80 y.o. Female) Kayla [name] Primary Care Kayla [name]: Jill Alexanders Other Clinician: Referring Kayla [name]: Treating Kayla [name]/Extender: Kayla [name] in Treatment: 0 Clinic Level of Care Assessment Items TOOL 1 Quantity Score X- 1 0 Use when EandM and Procedure is performed on INITIAL visit ASSESSMENTS - Nursing Assessment / Reassessment X- 1 20 General Physical Exam (combine w/ comprehensive assessment (listed just below) when performed on new pt. evals) X- 1 25 Comprehensive Assessment (HX, ROS, Risk Assessments, Wounds Hx, etc.) ASSESSMENTS - Wound and Skin Assessment / Reassessment X- 1 10 Dermatologic / Skin Assessment (not related to wound area) ASSESSMENTS - Ostomy and/or Continence Assessment and Care []  - 0 Incontinence Assessment and Management []  - 0 Ostomy Care Assessment and Management (repouching, etc.) PROCESS - Coordination of Care []  - 0 Simple Patient / Family Education for ongoing care X- 1 20 Complex (extensive) Patient / Family Education for ongoing care X- 1 10 Staff obtains Programmer, systems, Records, T Results / Process Orders est []  - 0 Staff telephones HHA, Nursing Homes / Clarify orders / etc []  - 0 Routine Transfer to another Facility (non-emergent condition) []  - 0 Routine Hospital Admission (non-emergent condition) []  - 0 New Admissions / Biomedical engineer / Ordering NPWT Apligraf, etc. , []  - 0 Emergency Hospital Admission (emergent condition) PROCESS - Special Needs []  - 0 Pediatric / Minor Patient Management []  - 0 Isolation Patient Management []  - 0 Hearing / Language / Visual special needs []  - 0 Assessment of Community assistance (transportation, D/C planning, etc.) []  - 0 Additional assistance / Altered mentation []  - 0 Support Surface(s) Assessment (bed, cushion, seat, etc.) INTERVENTIONS - Miscellaneous []  - 0 External ear exam []  - 0 Patient Transfer (multiple staff / Civil Service fast streamer / Similar devices) []  - 0 Simple Staple / Suture removal (25 or less) []  -  0 Complex Staple / Suture removal (26 or more) []  - 0 Hypo/Hyperglycemic Management (do not check if billed separately) X- 1 15 Ankle / Brachial Index (ABI) - do not check if billed separately Has the patient been seen at the hospital within the last three years: Yes Total Score: 100 Level Of  Care: New/Established - Level 3 Electronic Signature(s) Signed: 12/17/2020 5:20:22 PM By: Kayla [name] Entered By: Kayla [name] on 12/17/2020 09:54:15 -------------------------------------------------------------------------------- Lower Extremity Assessment Details Patient Name: Date of Service: [name], [facility] A. 12/17/2020 7:30 A M Medical Record Number: [id number] Patient Account Number: [id number] Date of Birth/Sex: Treating RN: April 25, 1941 (80 y.o. Female) Kayla [name] Primary Care Kayla [name]: Jill Alexanders Other Clinician: Referring Kayla [name]: Treating Kayla Goda/Extender: Kayla [name] in Treatment: 0 Edema Assessment Assessed: Kayla [name]: Yes] Kayla [name]: Yes] Edema: [Left: Yes] [Right: Yes] Calf Left: Right: Point of Measurement: 41 cm From Medial Instep 55 cm 52 cm Ankle Left: Right: Point of Measurement: 30 cm From Medial Instep 31.8 cm 31 cm Knee To Floor Left: Right: From Medial Instep 41 cm 41 cm Vascular Assessment Pulses: Dorsalis Pedis Palpable: [Left:Yes] [Right:Yes] Doppler Audible: [Right:Yes] Posterior Tibial Palpable: [Left:Yes] [Right:Yes] Notes Right ABI=Non Compressible Electronic Signature(s) Signed: 12/17/2020 9:55:48 AM By: Kayla [name] Signed: 12/17/2020 5:22:38 PM By: Kayla Hammock RN Entered By: Kayla [name] on 12/17/2020 09:55:48 -------------------------------------------------------------------------------- Multi Wound Chart Details Patient Name: Date of Service: Kayla [name] NES, [name] A. 12/17/2020 7:30 A M Medical Record Number: [id number] Patient Account Number: [id number] Date of Birth/Sex: Treating RN: 1940-12-12 (80 y.o.  Female) Kayla [name] Primary Care Laqueta Bonaventura: Jill Alexanders Other Clinician: Referring Hanna Ra: Treating Rakeem Colley/Extender: Kayla [name] in Treatment: 0 Vital Signs Height(in): 63 Capillary Blood Glucose(mg/dl): 112 Weight(lbs): 250 Pulse(bpm): 83 Body Mass Index(BMI): 46 Blood Pressure(mmHg): 141/74 Temperature(F): 98.7 Respiratory Rate(breaths/min): 17 Photos: [1:No Photos Right, Medial Lower Leg] [N/A:N/A N/A] Wound Location: [1:Blister] [N/A:N/A] Wounding Event: [1:Venous Leg Ulcer] [N/A:N/A] Primary Etiology: [1:Lymphedema, Hypertension, Type II] [N/A:N/A] Comorbid History: [1:Diabetes, Rheumatoid Arthritis, Osteoarthritis, Neuropathy 01/02/2020] [N/A:N/A] Date Acquired: [1:0] [N/A:N/A] Weeks of Treatment: [1:Open] [N/A:N/A] Wound Status: [1:0.3x0.3x0.1] [N/A:N/A] Measurements L x W x D (cm) [1:0.071] [N/A:N/A] A (cm) : rea [1:0.007] [N/A:N/A] Volume (cm) : [1:Full Thickness Without Exposed] [N/A:N/A] Classification: [1:Support Structures Medium] [N/A:N/A] Exudate A mount: [1:Serosanguineous] [N/A:N/A] Exudate Type: [1:red, brown] [N/A:N/A] Exudate Color: [1:Distinct, outline attached] [N/A:N/A] Wound Margin: [1:Large (67-100%)] [N/A:N/A] Granulation A mount: [1:Red, Pink] [N/A:N/A] Granulation Quality: [1:Small (1-33%)] [N/A:N/A] Necrotic A mount: [1:Fascia: No] [N/A:N/A] Exposed Structures: [1:Fat Layer (Subcutaneous Tissue): No Tendon: No Muscle: No Joint: No Bone: No None] [N/A:N/A] Epithelialization: [1:Debridement - Selective/Open Wound] [N/A:N/A] Debridement: Pre-procedure Verification/Time Out 08:30 [N/A:N/A] Taken: [1:Skin/Dermis] [N/A:N/A] Level: [1:0.09] [N/A:N/A] Debridement A (sq cm): [1:rea Other(Gauze)] [N/A:N/A] Instrument: [1:None] [N/A:N/A] Bleeding: [1:Procedure was tolerated well] [N/A:N/A] Debridement Treatment Response: [1:0.3x0.3x0.1] [N/A:N/A] Post Debridement Measurements L x W x D (cm) [1:0.007] [N/A:N/A] Post  Debridement Volume: (cm) [1:Debridement] [N/A:N/A] Treatment Notes Electronic Signature(s) Signed: 12/17/2020 9:54:37 AM By: Kayla [name] Entered By: Kayla [name] on 12/17/2020 09:54:37 -------------------------------------------------------------------------------- Multi-Disciplinary Care Plan Details Patient Name: Date of Service: [facility] NES, [facility] A. 12/17/2020 7:30 A M Medical Record Number: [id number] Patient Account Number: [id number] Date of Birth/Sex: Treating RN: 05-25-1941 (80 y.o. Female) Kayla [name] Primary Care Ahmeer Tuman: Jill Alexanders Other Clinician: Referring Laporchia Nakajima: Treating Arash Karstens/Extender: Kayla [name] in Treatment: 0 Active Inactive Orientation to the Wound Care Program Nursing Diagnoses: Knowledge deficit related to the wound healing center program Goals: Patient/caregiver will verbalize understanding of the [place] Date Initiated: 12/17/2020 Target Resolution Date: 01/21/2021 Goal Status: Active Interventions: Provide education on orientation to the wound center Notes: Venous Leg Ulcer Nursing Diagnoses: Actual venous Insuffiency (use after diagnosis is confirmed) Goals: Patient will maintain optimal edema control Date Initiated: 12/17/2020 Target Resolution Date: 01/21/2021 Goal Status: Active Interventions: Assess  peripheral edema status every visit. Compression as ordered Treatment Activities: Therapeutic compression applied : 12/17/2020 Notes: Electronic Signature(s) Signed: 12/17/2020 8:06:16 AM By: Kayla [name] Entered By: Kayla [name] on 12/17/2020 08:06:15 -------------------------------------------------------------------------------- Pain Assessment Details Patient Name: Date of Service: Kayla [name] NES, [facility] A. 12/17/2020 7:30 A M Medical Record Number: [id number] Patient Account Number: [id number] Date of Birth/Sex: Treating RN: [birth year] (80 y.o. Female) Kayla [name] Primary Care  Camarion Weier: Jill Alexanders Other Clinician: Referring Bralyn Espino: Treating Dacey Milberger/Extender: Kayla [name] in Treatment: 0 Active Problems Location of Pain Severity and Description of Pain Patient Has Paino Yes Site Locations Pain Location: Pain in Ulcers With Dressing Change: Yes Duration of the Pain. Constant / Intermittento Intermittent Rate the pain. Current Pain Level: 9 Worst Pain Level: 10 Least Pain Level: 0 Tolerable Pain Level: 9 Character of Pain Describe the Pain: Aching Pain Management and Medication Current Pain Management: Medication: Yes Cold Application: No Rest: Yes Massage: No Activity: No T.E.N.S.: No Heat Application: No Leg drop or elevation: No Is the Current Pain Management Adequate: Adequate How does your wound impact your activities of daily livingo Sleep: No Bathing: No Appetite: No Relationship With Others: No Bladder Continence: No Emotions: No Bowel Continence: No Work: No Toileting: No Drive: No Dressing: No Hobbies: No Electronic Signature(s) Signed: 12/17/2020 5:22:38 PM By: Kayla Hammock RN Entered By: Kayla [name] on 12/17/2020 08:15:56 -------------------------------------------------------------------------------- Patient/Caregiver Education Details Patient Name: Date of Service: Kayla [name] NES, [facility] 5/20/2022andnbsp7:30 [place] Record Number: [id number] Patient Account Number: [id number] Date of Birth/Gender: Treating RN: 08-04-1940 (80 y.o. Female) Kayla [name] Primary Care Physician: Jill Alexanders Other Clinician: Referring Physician: Treating Physician/Extender: Kayla [name] in Treatment: 0 Education Assessment Education Provided To: Patient Education Topics Provided Venous: Handouts: Controlling Swelling with Compression Stockings Methods: Explain/Verbal, Printed Responses: State content correctly [place]: o Handouts: Welcome T  [facility] [place] o Methods: Explain/Verbal, Printed Responses: State content correctly Electronic Signature(s) Signed: 12/17/2020 5:20:22 PM By: Kayla [name] Entered By: Kayla [name] on 12/17/2020 08:46:46 -------------------------------------------------------------------------------- Wound Assessment Details Patient Name: Date of Service: Kayla [name] NES, [facility] A. 12/17/2020 7:30 A M Medical Record Number: [id number] Patient Account Number: [id number] Date of Birth/Sex: Treating RN: 1940/09/22 (80 y.o. Female) Kayla [name] Primary Care Jacolby Risby: Jill Alexanders Other Clinician: Referring Tyrez Berrios: Treating Morna Flud/Extender: Kayla [name] in Treatment: 0 Wound Status Wound Number: 1 Primary Venous Leg Ulcer Etiology: Wound Location: Right, Medial Lower Leg Wound Open Wounding Event: Blister Status: Date Acquired: 01/02/2020 Comorbid Lymphedema, Hypertension, Type II Diabetes, Rheumatoid Weeks Of Treatment: 0 History: Arthritis, Osteoarthritis, Neuropathy Clustered Wound: No Photos Wound Measurements Length: (cm) 0.3 Width: (cm) 0.3 Depth: (cm) 0.1 Area: (cm) 0.071 Volume: (cm) 0.007 % Reduction in Area: 0% % Reduction in Volume: 0% Epithelialization: None Tunneling: No Undermining: No Wound Description Classification: Full Thickness Without Exposed Support Structures Wound Margin: Distinct, outline attached Exudate Amount: Medium Exudate Type: Serosanguineous Exudate Color: red, brown Foul Odor After Cleansing: No Slough/Fibrino Yes Wound Bed Granulation Amount: Large (67-100%) Exposed Structure Granulation Quality: Red, Pink Fascia Exposed: No Necrotic Amount: Small (1-33%) Fat Layer (Subcutaneous Tissue) Exposed: No Necrotic Quality: Adherent Slough Tendon Exposed: No Muscle Exposed: No Joint Exposed: No Bone Exposed: No Electronic Signature(s) Signed: 12/20/2020 3:41:13 PM By: Sandre Kitty Signed: 12/20/2020 5:30:27 PM  By: Kayla Hammock RN Previous Signature: 12/17/2020 5:22:38 PM Version By: Kayla Hammock RN Entered By: Sandre Kitty on 12/20/2020 09:13:46 -------------------------------------------------------------------------------- Vitals Details Patient Name: Date of Service:  [name], [place] A. 12/17/2020 7:30 A M Medical Record Number: [id number] Patient Account Number: [id number] Date of Birth/Sex: Treating RN: February 23, 1941 (80 y.o. Female) Kayla [name] Primary Care [place] Bryngelson: Jill Alexanders Other Clinician: Referring Chelsea Pedretti: Treating Amar Keenum/Extender: Kayla [name] in Treatment: 0 Vital Signs Time Taken: 08:07 Temperature (F): 98.7 Height (in): 63 Pulse (bpm): 83 Source: Stated Respiratory Rate (breaths/min): 17 Weight (lbs): 250 Blood Pressure (mmHg): 141/74 Source: Stated Capillary Blood Glucose (mg/dl): 112 Body Mass Index (BMI): 44.3 Reference Range: 80 - 120 mg / dl Electronic Signature(s) Signed: 12/17/2020 5:22:38 PM By: Kayla Hammock RN Entered By: Kayla [name] on 12/17/2020 08:55:46

## 2020-12-17 NOTE — Progress Notes (Signed)
Kayla, [name] ([id number]) Visit Report for 12/17/2020 Abuse/Suicide Risk Screen Details Patient Name: Date of Service: Kayla [name] NES, [name] 12/17/2020 7:30 A M Medical Record Number: [id number] Patient Account Number: [id number] Date of Birth/Sex: Treating RN: July 27, 1941 (80 y.o. Kayla [name] Primary Care Rache Klimaszewski: Jill Alexanders Other Clinician: Referring Lejon Afzal: Treating Kamyrah Feeser/Extender: [name] in Treatment: 0 Abuse/Suicide Risk Screen Items Answer ABUSE RISK SCREEN: Has anyone close to you tried to hurt or harm you recentlyo No Do you feel uncomfortable with anyone in your familyo No Has anyone forced you do things that you didnt want to doo No Electronic Signature(s) Signed: 12/17/2020 5:22:38 PM By: Rhae Hammock RN Entered By: Rhae Hammock on 12/17/2020 08:13:15 -------------------------------------------------------------------------------- Activities of Daily Living Details Patient Name: Date of Service: [facility] NES, [name] 12/17/2020 7:30 A M Medical Record Number: [id number] Patient Account Number: [id number] Date of Birth/Sex: Treating RN: April 20, 1941 (80 y.o. Kayla [name] Primary Care Chauntay Paszkiewicz: Jill Alexanders Other Clinician: Referring Signora Zucco: Treating Tylan Kinn/Extender: [name] in Treatment: 0 Activities of Daily Living Items Answer Activities of Daily Living (Please select one for each item) Drive Automobile Not Able T Medications ake Completely Able Use T elephone Completely Able Care for Appearance Need Assistance Use T oilet Need Assistance Bath / Shower Need Assistance Dress Self Need Assistance Feed Self Completely Able Walk Need Assistance Get In / Out Bed Need Assistance Housework Need Assistance Prepare Meals Need Assistance Handle Money Need Assistance Shop for Self Need Assistance Electronic Signature(s) Signed: 12/17/2020 5:22:38 PM By: Rhae Hammock  RN Entered By: Rhae Hammock on 12/17/2020 08:13:47 -------------------------------------------------------------------------------- Education Screening Details Patient Name: Date of Service: Kayla [name] NES, [facility] A. 12/17/2020 7:30 A M Medical Record Number: [id number] Patient Account Number: [id number] Date of Birth/Sex: Treating RN: 01/31/1941 (80 y.o. Kayla [name] Primary Care Carnesha Maravilla: Jill Alexanders Other Clinician: Referring Avonna Iribe: Treating Dayshaun Whobrey/Extender: [name] in Treatment: 0 Primary Learner Assessed: Patient Learning Preferences/Education Level/Primary Language Learning Preference: Explanation, Demonstration, Communication Board, Printed Material Highest Education Level: High School Preferred Language: English Cognitive Barrier Language Barrier: No Translator Needed: No Memory Deficit: No Emotional Barrier: No Cultural/Religious Beliefs Affecting Medical Care: No Physical Barrier Impaired Vision: Yes Glasses Impaired Hearing: No Decreased Hand dexterity: No Knowledge/Comprehension Knowledge Level: High Comprehension Level: High Ability to understand written instructions: High Ability to understand verbal instructions: High Motivation Anxiety Level: Calm Cooperation: Cooperative Education Importance: Denies Need Interest in Health Problems: Asks Questions Perception: Coherent Willingness to Engage in Self-Management High Activities: Readiness to Engage in Self-Management High Activities: Electronic Signature(s) Signed: 12/17/2020 5:22:38 PM By: Rhae Hammock RN Entered By: Rhae Hammock on 12/17/2020 08:14:13 -------------------------------------------------------------------------------- Fall Risk Assessment Details Patient Name: Date of Service: Kayla NES, [name] 12/17/2020 7:30 A M Medical Record Number: [id number] Patient Account Number: [id number] Date of Birth/Sex: Treating RN: 1940-08-24 (80 y.o. Kayla [name] Primary Care Kayla [name]: Jill Alexanders Other Clinician: Referring Necola Bluestein: Treating Zuriel Roskos/Extender: [name] in Treatment: 0 Fall Risk Assessment Items Have you had 2 or more falls in the last 12 monthso 0 Yes Have you had any fall that resulted in injury in the last 12 monthso 0 No FALLS RISK SCREEN History of falling - immediate or within 3 months 0 No Secondary diagnosis (Do you have 2 or more medical diagnoseso) 0 No Ambulatory aid None/bed rest/wheelchair/nurse 0 No Crutches/cane/walker 0 No Furniture 0 No Intravenous therapy Access/Saline/Heparin Lock 0 No Gait/Transferring Normal/ bed rest/ wheelchair 0 No  Weak (short steps with or without shuffle, stooped but able to lift head while walking, may seek 0 No support from furniture) Impaired (short steps with shuffle, may have difficulty arising from chair, head down, impaired 0 No balance) Mental Status Oriented to own ability 0 No Electronic Signature(s) Signed: 12/17/2020 5:22:38 PM By: Rhae Hammock RN Entered By: Rhae Hammock on 12/17/2020 08:14:23 -------------------------------------------------------------------------------- Foot Assessment Details Patient Name: Date of Service: Kayla [name] NES, Kayla A. 12/17/2020 7:30 [place] Record Number: [id number] Patient Account Number: [id number] Date of Birth/Sex: Treating RN: Dec 22, 1940 (80 y.o. Kayla [name] Primary Care Kolsen Choe: Jill Alexanders Other Clinician: Referring Remona Boom: Treating Arling Cerone/Extender: [name] in Treatment: 0 Foot Assessment Items Site Locations + = Sensation present, - = Sensation absent, C = Callus, U = Ulcer R = Redness, W = Warmth, M = Maceration, PU = Pre-ulcerative lesion F = Fissure, S = Swelling, D = Dryness Assessment Right: Left: Other Deformity: No No Prior Foot Ulcer: No No Prior Amputation: No No Charcot Joint: No No Ambulatory Status:  Ambulatory With Help Assistance Device: Wheelchair Gait: Steady Electronic Signature(s) Signed: 12/17/2020 5:22:38 PM By: Rhae Hammock RN Entered By: Rhae Hammock on 12/17/2020 08:24:09 -------------------------------------------------------------------------------- Nutrition Risk Screening Details Patient Name: Date of Service: Kayla NES, [place] A. 12/17/2020 7:30 [place] Record Number: [id number] Patient Account Number: [id number] Date of Birth/Sex: Treating RN: [birth year] (80 y.o. Kayla [name] Primary Care Niana Martorana: Jill Alexanders Other Clinician: Referring Laketha Leopard: Treating Krisinda Giovanni/Extender: [name] in Treatment: 0 Height (in): 63 Weight (lbs): 250 Body Mass Index (BMI): 44.3 Nutrition Risk Screening Items Score Screening NUTRITION RISK SCREEN: I have an illness or condition that made me change the kind and/or amount of food I eat 0 No I eat fewer than two meals per day 0 No I eat few fruits and vegetables, or milk products 0 No I have three or more drinks of beer, liquor or wine almost every day 0 No I have tooth or mouth problems that make it hard for me to eat 0 No I don't always have enough money to buy the food I need 0 No I eat alone most of the time 0 No I take three or more different prescribed or over-the-counter drugs a day 0 No Without wanting to, I have lost or gained 10 pounds in the last six months 0 No I am not always physically able to shop, cook and/or feed myself 0 No Nutrition Protocols Good Risk Protocol 0 No interventions needed Moderate Risk Protocol High Risk Proctocol Risk Level: Good Risk Score: 0 Electronic Signature(s) Signed: 12/17/2020 5:22:38 PM By: Rhae Hammock RN Entered By: Rhae Hammock on 12/17/2020 08:14:38

## 2020-12-21 ENCOUNTER — Other Ambulatory Visit: Payer: Self-pay

## 2020-12-21 ENCOUNTER — Ambulatory Visit (HOSPITAL_COMMUNITY)
Admission: RE | Admit: 2020-12-21 | Discharge: 2020-12-21 | Disposition: A | Discharge status: Home / Self Care | Payer: Medicare PPO | Source: Ambulatory Visit | Attending: Internal Medicine | Admitting: Internal Medicine

## 2020-12-21 DIAGNOSIS — R2242 Localized swelling, mass and lump, left lower limb: Secondary | ICD-10-CM | POA: Diagnosis not present

## 2020-12-21 DIAGNOSIS — R600 Localized edema: Secondary | ICD-10-CM | POA: Diagnosis not present

## 2020-12-24 ENCOUNTER — Other Ambulatory Visit: Payer: Self-pay

## 2020-12-24 ENCOUNTER — Encounter (HOSPITAL_BASED_OUTPATIENT_CLINIC_OR_DEPARTMENT_OTHER): Payer: Medicare PPO | Admitting: Internal Medicine

## 2020-12-24 DIAGNOSIS — L03116 Cellulitis of left lower limb: Secondary | ICD-10-CM | POA: Diagnosis not present

## 2020-12-24 DIAGNOSIS — I890 Lymphedema, not elsewhere classified: Secondary | ICD-10-CM

## 2020-12-24 DIAGNOSIS — I872 Venous insufficiency (chronic) (peripheral): Secondary | ICD-10-CM

## 2020-12-24 DIAGNOSIS — L97819 Non-pressure chronic ulcer of other part of right lower leg with unspecified severity: Secondary | ICD-10-CM | POA: Diagnosis not present

## 2020-12-24 DIAGNOSIS — E119 Type 2 diabetes mellitus without complications: Secondary | ICD-10-CM | POA: Diagnosis not present

## 2020-12-24 NOTE — Progress Notes (Addendum)
Kayla [name], Kayla [name] ([id number]) Visit Report for 12/24/2020 Arrival Information Details Patient Name: Date of Service: Kayla [name] [name], [name] 12/24/2020 10:30 A M Medical Record Number: [id number] Patient Account Number: [id number] Date of Birth/Sex: Treating RN: 1940-11-10 (80 y.o. Kayla [name] Primary Care Dryden Tapley: Kayla [name] Other Clinician: Referring Kayla [name]: Treating Kayla [name]/Extender: Kayla [name] in Treatment: 1 Visit Information History Since Last Visit Added or deleted any medications: No Patient Arrived: Wheel Chair Any new allergies or adverse reactions: No Arrival Time: 11:28 Had a fall or experienced change in No Accompanied By: self activities of daily living that may affect Transfer Assistance: Manual risk of falls: Patient Identification Verified: Yes Signs or symptoms of abuse/neglect since last visito No Secondary Verification Process Completed: Yes Hospitalized since last visit: No Patient Requires Transmission-Based Precautions: No Implantable device outside of the clinic excluding No Patient Has Alerts: Yes cellular tissue based products placed in the center Patient Alerts: R ABI=Kayla [name] since last visit: Has Dressing in Place as Prescribed: Yes Pain Present Now: Yes Electronic Signature(s) Signed: 12/28/2020 5:24:49 PM By: Kayla [name] Entered By: Kayla [name] on 12/24/2020 11:30:12 -------------------------------------------------------------------------------- Clinic Level of Care Assessment Details Patient Name: Date of Service: Kayla [name] [name], [name] 12/24/2020 10:30 A M Medical Record Number: [id number] Patient Account Number: [id number] Date of Birth/Sex: Treating RN: 08-Feb-1941 (80 y.o. Kayla [name] Primary Care Kayla [name]: Kayla [name] Other Clinician: Referring Kayla [name]: Treating Kayla [name]/Extender: Kayla [name] in Treatment: 1 Clinic Level of Care Assessment Items TOOL 4 Quantity Score X- 1  0 Use when only an EandM is performed on FOLLOW-UP visit ASSESSMENTS - Nursing Assessment / Reassessment X- 1 10 Reassessment of Co-morbidities (includes updates in patient status) X- 1 5 Reassessment of Adherence to Treatment Plan ASSESSMENTS - Wound and Skin A ssessment / Reassessment X - Simple Wound Assessment / Reassessment - one wound 1 5 []  - 0 Complex Wound Assessment / Reassessment - multiple wounds []  - 0 Dermatologic / Skin Assessment (not related to wound area) ASSESSMENTS - Focused Assessment X- 1 5 Circumferential Edema Measurements - multi extremities []  - 0 Nutritional Assessment / Counseling / Intervention []  - 0 Lower Extremity Assessment (monofilament, tuning fork, pulses) []  - 0 Peripheral Arterial Disease Assessment (using hand held doppler) ASSESSMENTS - Ostomy and/or Continence Assessment and Care []  - 0 Incontinence Assessment and Management []  - 0 Ostomy Care Assessment and Management (repouching, etc.) PROCESS - Coordination of Care []  - 0 Simple Patient / Family Education for ongoing care X- 1 20 Complex (extensive) Patient / Family Education for ongoing care []  - 0 Staff obtains Programmer, systems, Records, T Results / Process Orders est []  - 0 Staff telephones HHA, Nursing Homes / Clarify orders / etc []  - 0 Routine Transfer to another Facility (non-emergent condition) []  - 0 Routine Hospital Admission (non-emergent condition) []  - 0 New Admissions / Biomedical engineer / Ordering NPWT Apligraf, etc. , []  - 0 Emergency Hospital Admission (emergent condition) []  - 0 Simple Discharge Coordination []  - 0 Complex (extensive) Discharge Coordination PROCESS - Special Needs []  - 0 Pediatric / Minor Patient Management []  - 0 Isolation Patient Management []  - 0 Hearing / Language / Visual special needs []  - 0 Assessment of Community assistance (transportation, D/C planning, etc.) []  - 0 Additional assistance / Altered mentation []  -  0 Support Surface(s) Assessment (bed, cushion, seat, etc.) INTERVENTIONS - Wound Cleansing / Measurement []  - 0 Simple Wound Cleansing - one wound []  - 0 Complex Wound Cleansing -  multiple wounds X- 1 5 Wound Imaging (photographs - any number of wounds) []  - 0 Wound Tracing (instead of photographs) []  - 0 Simple Wound Measurement - one wound []  - 0 Complex Wound Measurement - multiple wounds INTERVENTIONS - Wound Dressings []  - 0 Small Wound Dressing one or multiple wounds X- 1 15 Medium Wound Dressing one or multiple wounds []  - 0 Large Wound Dressing one or multiple wounds []  - 0 Application of Medications - topical []  - 0 Application of Medications - injection INTERVENTIONS - Miscellaneous []  - 0 External ear exam []  - 0 Specimen Collection (cultures, biopsies, blood, body fluids, etc.) []  - 0 Specimen(s) / Culture(s) sent or taken to Lab for analysis []  - 0 Patient Transfer (multiple staff / Civil Service fast streamer / Similar devices) []  - 0 Simple Staple / Suture removal (25 or less) []  - 0 Complex Staple / Suture removal (26 or more) []  - 0 Hypo / Hyperglycemic Management (close monitor of Blood Glucose) []  - 0 Ankle / Brachial Index (ABI) - do not check if billed separately X- 1 5 Vital Signs Has the patient been seen at the hospital within the last three years: Yes Total Score: 70 Level Of Care: New/Established - Level 2 Electronic Signature(s) Signed: 12/24/2020 1:31:18 PM By: Kayla [name] Entered By: Kayla [name] on 12/24/2020 11:44:30 -------------------------------------------------------------------------------- Encounter Discharge Information Details Patient Name: Date of Service: Kayla [name] [name], [name] 12/24/2020 10:30 A M Medical Record Number: [id number] Patient Account Number: [id number] Date of Birth/Sex: Treating RN: 13-Apr-1941 (80 y.o. Kayla [name] Primary Care Kayla [name]: Kayla [name] Other Clinician: Referring Kayla [name]: Treating Kayla [name]/Extender:  Kayla [name] in Treatment: 1 Encounter Discharge Information Items Discharge Condition: Stable Ambulatory Status: Wheelchair Discharge Destination: Home Transportation: Private Auto Schedule Follow-up Appointment: Yes Clinical Summary of Care: Provided on 12/24/2020 Form Type Recipient Paper Patient Patient Electronic Signature(s) Signed: 12/24/2020 12:57:15 PM By: Kayla [name] Entered By: Kayla [name] on 12/24/2020 12:57:15 -------------------------------------------------------------------------------- Lower Extremity Assessment Details Patient Name: Date of Service: [facility] [name], [facility] [name] A. 12/24/2020 10:30 A M Medical Record Number: [id number] Patient Account Number: [id number] Date of Birth/Sex: Treating RN: [birth year] (80 y.o. Kayla [name] Primary Care Karmela Bram: Kayla [name] Other Clinician: Referring Shyla Gayheart: Treating Louis Ivery/Extender: Kayla [name] in Treatment: 1 Edema Assessment Assessed: Shirlyn Goltz: Yes] Patrice [name]: Yes] Edema: [Left: Yes] [Right: Yes] Calf Left: Right: Point of Measurement: 41 cm From Medial Instep 55 cm 52 cm Ankle Left: Right: Point of Measurement: 30 cm From Medial Instep 32 cm 32 cm Vascular Assessment Pulses: Dorsalis Pedis Palpable: [Left:Yes] [Right:Yes] Electronic Signature(s) Signed: 12/24/2020 11:36:09 AM By: Kayla [name] Entered By: Kayla [name] on 12/24/2020 11:36:09 -------------------------------------------------------------------------------- Multi Wound Chart Details Patient Name: Date of Service: Kayla [name] [name], Kayla A. 12/24/2020 10:30 A M Medical Record Number: [id number] Patient Account Number: [id number] Date of Birth/Sex: Treating RN: Nov 24, 1940 (80 y.o. Kayla [name] Primary Care Kelbie Moro: Kayla [name] Other Clinician: Referring Khamryn Calderone: Treating Tashanna Dolin/Extender: Kayla [name] in Treatment: 1 Vital Signs Height(in): 63 Capillary Blood  Glucose(mg/dl): 102 Weight(lbs): 250 Pulse(bpm): 27 Body Mass Index(BMI): 43 Blood Pressure(mmHg): 162/79 Temperature(F): 98.7 Respiratory Rate(breaths/min): 17 Photos: [1:No Photos Right, Medial Lower Leg] [N/A:N/A N/A] Wound Location: [1:Blister] [N/A:N/A] Wounding Event: [1:Venous Leg Ulcer] [N/A:N/A] Primary Etiology: [1:Lymphedema, Hypertension, Type II] [N/A:N/A] Comorbid History: [1:Diabetes, Rheumatoid Arthritis, Osteoarthritis, Neuropathy 01/02/2020] [N/A:N/A] Date Acquired: [1:1] [N/A:N/A] Weeks of Treatment: [1:Healed - Epithelialized] [N/A:N/A] Wound Status: [1:0x0x0] [N/A:N/A] Measurements L x W x D (cm) [1:0] [N/A:N/A] A (cm) :  rea [1:0] [N/A:N/A] Volume (cm) : [1:100.00%] [N/A:N/A] % Reduction in Area: [1:100.00%] [N/A:N/A] % Reduction in Volume: [1:Full Thickness Without Exposed] [N/A:N/A] Classification: [1:Support Structures] Treatment Notes Electronic Signature(s) Signed: 12/24/2020 1:31:18 PM By: Kayla [name] Signed: 12/24/2020 1:32:17 PM By: Kalman Shan DO Entered By: Kalman Shan on 12/24/2020 13:27:11 -------------------------------------------------------------------------------- Multi-Disciplinary Care Plan Details Patient Name: Date of Service: [facility] [name], [facility] A. 12/24/2020 10:30 A M Medical Record Number: [id number] Patient Account Number: [id number] Date of Birth/Sex: Treating RN: 02/01/1941 (80 y.o. Kayla [name] Primary Care Tachina Spoonemore: Kayla [name] Other Clinician: Referring Serene Kopf: Treating Veta Dambrosia/Extender: Kayla [name] in Treatment: 1 Active Inactive Electronic Signature(s) Signed: 01/26/2021 11:33:32 AM By: Kayla [name] Previous Signature: 12/24/2020 11:26:34 AM Version By: Kayla [name] Entered By: Kayla [name] on 01/26/2021 11:33:32 -------------------------------------------------------------------------------- Pain Assessment Details Patient Name: Date of Service: Kayla [name] [name], [facility] A.  12/24/2020 10:30 A M Medical Record Number: [id number] Patient Account Number: [id number] Date of Birth/Sex: Treating RN: 1941-03-12 (80 y.o. Kayla [name] Primary Care Glee Lashomb: Kayla [name] Other Clinician: Referring Blanche Gallien: Treating Izick Gasbarro/Extender: Kayla [name] in Treatment: 1 Active Problems Location of Pain Severity and Description of Pain Patient Has Paino Yes Site Locations Rate the pain. Current Pain Level: 2 Pain Management and Medication Current Pain Management: Electronic Signature(s) Signed: 12/24/2020 1:31:18 PM By: Kayla [name] Signed: 12/28/2020 5:24:49 PM By: Kayla [name] Entered By: Kayla [name] on 12/24/2020 11:31:34 -------------------------------------------------------------------------------- Patient/Caregiver Education Details Patient Name: Date of Service: Kayla [name] [name], Kayla [name] 5/27/2022andnbsp10:30 [place] Record Number: [id number] Patient Account Number: [id number] Date of Birth/Gender: Treating RN: 25-Dec-1940 (80 y.o. Kayla [name] Primary Care Physician: Kayla [name] Other Clinician: Referring Physician: Treating Physician/Extender: Kayla [name] in Treatment: 1 Education Assessment Education Provided To: Patient Education Topics Provided Venous: Methods: Demonstration, Explain/Verbal, Printed Responses: State content correctly Wound/Skin Impairment: Methods: Explain/Verbal, Printed Responses: State content correctly Electronic Signature(s) Signed: 12/24/2020 1:31:18 PM By: Kayla [name] Entered By: Kayla [name] on 12/24/2020 11:27:00 -------------------------------------------------------------------------------- Wound Assessment Details Patient Name: Date of Service: Kayla [name] [name], [facility] A. 12/24/2020 10:30 A M Medical Record Number: [id number] Patient Account Number: [id number] Date of Birth/Sex: Treating RN: 03/11/1941 (80 y.o. Kayla [name] Primary Care Sahir Tolson:  Kayla [name] Other Clinician: Referring Bernadett Milian: Treating Shadrach Bartunek/Extender: Kayla [name] in Treatment: 1 Wound Status Wound Number: 1 Primary Venous Leg Ulcer Etiology: Wound Location: Right, Medial Lower Leg Wound Healed - Epithelialized Wounding Event: Blister Status: Date Acquired: 01/02/2020 Comorbid Lymphedema, Hypertension, Type II Diabetes, Rheumatoid Weeks Of Treatment: 1 History: Arthritis, Osteoarthritis, Neuropathy Clustered Wound: No Wound Measurements Length: (cm) Width: (cm) Depth: (cm) Area: (cm) Volume: (cm) 0 % Reduction in Area: 100% 0 % Reduction in Volume: 100% 0 0 0 Wound Description Classification: Full Thickness Without Exposed Support Structur es Electronic Signature(s) Signed: 12/24/2020 11:35:41 AM By: Kayla [name] Entered By: Kayla [name] on 12/24/2020 11:35:41 -------------------------------------------------------------------------------- Vitals Details Patient Name: Date of Service: Kayla [name], Kayla A. 12/24/2020 10:30 A M Medical Record Number: [id number] Patient Account Number: [id number] Date of Birth/Sex: Treating RN: 09-28-1940 (80 y.o. Kayla [name] Primary Care Chritopher Coster: Kayla [name] Other Clinician: Referring Lorilynn Lehr: Treating Markeem Noreen/Extender: Kayla [name] in Treatment: 1 Vital Signs Time Taken: 11:30 Temperature (F): 98.7 Height (in): 63 Pulse (bpm): 79 Weight (lbs): 250 Respiratory Rate (breaths/min): 17 Body Mass Index (BMI): 44.3 Blood Pressure (mmHg): 162/79 Capillary Blood Glucose (mg/dl): 102 Reference Range: 80 - 120 mg / dl Electronic Signature(s) Signed: 12/28/2020 5:24:49 PM By: Rudean Curt,  [place] Entered By: Kayla [name] on 12/24/2020 11:30:34

## 2020-12-24 NOTE — Progress Notes (Signed)
Kayla [name], Kayla [name] ([id number]) Visit Report for 12/24/2020 Chief Complaint Document Details Patient Name: Date of Service: Kayla [name], [name] 12/24/2020 10:30 A M Medical Record Number: [id number] Patient Account Number: [id number] Date of Birth/Sex: Treating RN: Jun 25, 1941 (80 y.o. Kayla [name] Primary Care Provider: Jill [name] Other Clinician: Referring Provider: Treating Provider/Extender: Kayla [name] in Treatment: 1 Information Obtained from: Patient Chief Complaint Right lower extremity wound with bilateral lower extremity swelling and pain to the anterior left leg Electronic Signature(s) Signed: 12/24/2020 1:32:17 PM By: Kayla Shan DO Entered By: Kayla [name] on 12/24/2020 13:27:20 -------------------------------------------------------------------------------- HPI Details Patient Name: Date of Service: Kayla [name], Kayla A. 12/24/2020 10:30 A M Medical Record Number: [id number] Patient Account Number: [id number] Date of Birth/Sex: Treating RN: Sep 13, 1940 (80 y.o. Kayla [name] Primary Care Provider: Jill [name] Other Clinician: Referring Provider: Treating Provider/Extender: Kayla [name] in Treatment: 1 History of Present Illness HPI Description: Admission 5/20 Kayla [name] is an 80 year old female with a past medical history of type 2 diabetes on oral agents, chronic venous insufficiency and lymphedema that presents to our clinic for bilateral lower extremity swelling with a small open wound to the right lower extremity. She states that her swelling has increased over the past year. She does not use compression stockings although she does own some. She states these are too hard to get on. She tells me she does not have a history of open wounds however in the EMR it is noted that she has had several cases of open wounds to her legs. She reports pain to shin of her left lower extremity. She is currently on  doxycycline for cellulitis of her calf. She states that the pain to her shin has been present for the past 6 months and has not gotten better. There are no open wounds to the left lower extremity. 5/27; patient presents for 1 week follow-up. Her wound has healed on her right lower extremity. She has no open wounds to her left lower extremity. She had an ultrasound of an suspicious area last week on the left lower extremity that could possibly have been an abscess. She received her juxta light compression and has it today. She denies signs of infection. Electronic Signature(s) Signed: 12/24/2020 1:32:17 PM By: Kayla Shan DO Entered By: Kayla [name] on 12/24/2020 13:28:04 -------------------------------------------------------------------------------- Physical Exam Details Patient Name: Date of Service: Kayla [name], Kayla A. 12/24/2020 10:30 A M Medical Record Number: [id number] Patient Account Number: [id number] Date of Birth/Sex: Treating RN: 26-Apr-1941 (80 y.o. Kayla [name] Primary Care Provider: Jill [name] Other Clinician: Referring Provider: Treating Provider/Extender: Kayla [name] in Treatment: 1 Constitutional respirations regular, non-labored and within target range for patient.[name] Psychiatric pleasant and cooperative. Notes No open wounds. 2+ pitting edema to the knees bilaterally. There is still a fluctuance to the anterior shin on her left lower extremity that is mildly erythematous. No drainage. Venous stasis and lymphedema skin changes present bilaterally Electronic Signature(s) Signed: 12/24/2020 1:32:17 PM By: Kayla Shan DO Entered By: Kayla [name] on 12/24/2020 13:28:52 -------------------------------------------------------------------------------- Physician Orders Details Patient Name: Date of Service: Kayla [name], Kayla A. 12/24/2020 10:30 A M Medical Record Number: [id number] Patient Account Number: [id number] Date of Birth/Sex:  Treating RN: [birth year] (80 y.o. Kayla [name] Primary Care Provider: Jill [name] Other Clinician: Referring Provider: Treating Provider/Extender: Kayla [name] in Treatment: 1 Verbal / Phone Orders: No Diagnosis Coding ICD-10 Coding Code Description L97.819 Non-pressure chronic ulcer of other  part of right lower leg with unspecified severity L03.116 Cellulitis of left lower limb I89.0 Lymphedema, not elsewhere classified I87.2 Venous insufficiency (chronic) (peripheral) E11.9 Type 2 diabetes mellitus without complications Discharge From [facility] Discharge from [place] - No need to follow up, call if any changes or area re-opens. [facility] will be calling to set up appointment for referral sent in by Dr. Heber [name]. Edema Control - Lymphedema / SCD / Other Compression stocking or Garment 20-30 mm/Hg pressure to: - Juxtalite CircAid to right leg Additional Orders / Instructions Follow Nutritious Diet Other: - May try applying Voltaren Gel to knee for arthritis pain. Electronic Signature(s) Signed: 12/24/2020 1:32:17 PM By: Kayla Shan DO Previous Signature: 12/24/2020 11:26:27 AM Version By: Lorrin Jackson Entered By: Kayla [name] on 12/24/2020 13:29:05 -------------------------------------------------------------------------------- Problem List Details Patient Name: Date of Service: Kayla [name], Kayla A. 12/24/2020 10:30 A M Medical Record Number: [id number] Patient Account Number: [id number] Date of Birth/Sex: Treating RN: October 28, 1940 (80 y.o. Kayla [name] Primary Care Provider: Jill [name] Other Clinician: Referring Provider: Treating Provider/Extender: Kayla [name] in Treatment: 1 Active Problems ICD-10 Encounter Code Description Active Date MDM Diagnosis L97.819 Non-pressure chronic ulcer of other part of right lower leg with unspecified 12/17/2020 No Yes severity L03.116 Cellulitis of  left lower limb 12/17/2020 No Yes I89.0 Lymphedema, not elsewhere classified 12/17/2020 No Yes I87.2 Venous insufficiency (chronic) (peripheral) 12/17/2020 No Yes E11.9 Type 2 diabetes mellitus without complications 6/64/4034 No Yes Inactive Problems Resolved Problems Electronic Signature(s) Signed: 12/24/2020 1:32:17 PM By: Kayla Shan DO Previous Signature: 12/24/2020 11:25:52 AM Version By: Lorrin Jackson Entered By: Kayla [name] on 12/24/2020 13:26:46 -------------------------------------------------------------------------------- Progress Note Details Patient Name: Date of Service: Kayla [name], [place] A. 12/24/2020 10:30 A M Medical Record Number: [id number] Patient Account Number: [id number] Date of Birth/Sex: Treating RN: November 04, 1940 (80 y.o. Kayla [name] Primary Care Provider: Jill [name] Other Clinician: Referring Provider: Treating Provider/Extender: Kayla [name] in Treatment: 1 Subjective Chief Complaint Information obtained from Patient Right lower extremity wound with bilateral lower extremity swelling and pain to the anterior left leg History of Present Illness (HPI) Admission 5/20 Kayla [name] is an 80 year old female with a past medical history of type 2 diabetes on oral agents, chronic venous insufficiency and lymphedema that presents to our clinic for bilateral lower extremity swelling with a small open wound to the right lower extremity. She states that her swelling has increased over the past year. She does not use compression stockings although she does own some. She states these are too hard to get on. She tells me she does not have a history of open wounds however in the EMR it is noted that she has had several cases of open wounds to her legs. She reports pain to shin of her left lower extremity. She is currently on doxycycline for cellulitis of her calf. She states that the pain to her shin has been present for the past 6  months and has not gotten better. There are no open wounds to the left lower extremity. 5/27; patient presents for 1 week follow-up. Her wound has healed on her right lower extremity. She has no open wounds to her left lower extremity. She had an ultrasound of an suspicious area last week on the left lower extremity that could possibly have been an abscess. She received her juxta light compression and has it today. She denies signs of infection. Patient History Information obtained from Patient. Family History Unknown  History. Social History Never smoker, Marital Status - Widowed, Alcohol Use - Never, Drug Use - No History, Caffeine Use - Moderate. Medical History Eyes Denies history of Cataracts, Glaucoma, Optic Neuritis Ear/Nose/Mouth/Throat Denies history of Chronic sinus problems/congestion, Middle ear problems Hematologic/Lymphatic Patient has history of Lymphedema Denies history of Anemia, Hemophilia, Human Immunodeficiency Virus, Sickle Cell Disease Respiratory Denies history of Aspiration, Asthma, Chronic Obstructive Pulmonary Disease (COPD), Pneumothorax, Sleep Apnea, Tuberculosis Cardiovascular Patient has history of Hypertension Denies history of Angina, Arrhythmia, Congestive Heart Failure, Coronary Artery Disease, Deep Vein Thrombosis, Hypotension, Myocardial Infarction, Peripheral Arterial Disease, Peripheral Venous Disease, Phlebitis, Vasculitis Gastrointestinal Denies history of Cirrhosis , Colitis, Crohnoos, Hepatitis A, Hepatitis B, Hepatitis C Endocrine Patient has history of Type II Diabetes Denies history of Type I Diabetes Genitourinary Denies history of End Stage Renal Disease Immunological Denies history of Lupus Erythematosus, Raynaudoos, Scleroderma Integumentary (Skin) Denies history of History of Burn Musculoskeletal Patient has history of Rheumatoid Arthritis, Osteoarthritis Denies history of Gout, Osteomyelitis Neurologic Patient has history of  Neuropathy Denies history of Dementia, Quadriplegia, Paraplegia, Seizure Disorder Oncologic Denies history of Received Chemotherapy, Received Radiation Medical A Surgical History Notes nd Cardiovascular hyperlipidemia Gastrointestinal GERD Objective Constitutional respirations regular, non-labored and within target range for patient.. Vitals Time Taken: 11:30 AM, Height: 63 in, Weight: 250 lbs, BMI: 44.3, Temperature: 98.7 F, Pulse: 79 bpm, Respiratory Rate: 17 breaths/min, Blood Pressure: 162/79 mmHg, Capillary Blood Glucose: 102 mg/dl. Psychiatric pleasant and cooperative. General Notes: No open wounds. 2+ pitting edema to the knees bilaterally. There is still a fluctuance to the anterior shin on her left lower extremity that is mildly erythematous. No drainage. Venous stasis and lymphedema skin changes present bilaterally Integumentary (Hair, Skin) Wound #1 status is Healed - Epithelialized. Original cause of wound was Blister. The date acquired was: 01/02/2020. The wound has been in treatment 1 weeks. The wound is located on the Right,Medial Lower Leg. The wound measures 0cm length x 0cm width x 0cm depth; 0cm^2 area and 0cm^3 volume. Assessment Active Problems ICD-10 Non-pressure chronic ulcer of other part of right lower leg with unspecified severity Cellulitis of left lower limb Lymphedema, not elsewhere classified Venous insufficiency (chronic) (peripheral) Type 2 diabetes mellitus without complications Patient has no open wounds today. The small open wound to her right lower extremity is healed nicely. She has her juxta light today. She had an ultrasound done of her left lower extremity as I was concerned for an underlying abscess. Fortunately this did not show any fluid collection. I asked her to follow-up with her primary care provider for this issue. She is to call us with any questions or concerns. She is to follow-up as needed if any wounds develop. She unfortunately  cannot put on a compression stocking on the left lower extremity and cannot afford an out-of-pocket Velcro wrap. She would benefit from lymphedema pumps but needs conservative treatment for 4 weeks to qualify. Plan Discharge From [facility] Services: Discharge from [place] - No need to follow up, call if any changes or area re-opens. [facility] will be calling to set up appointment for referral sent in by Dr. Heber [place]. Edema Control - Lymphedema / SCD / Other: Compression stocking or Garment 20-30 mm/Hg pressure to: - Juxtalite CircAid to right leg Additional Orders / Instructions: Follow Nutritious Diet Other: - May try applying Voltaren Gel to knee for arthritis pain. 1. Discharge from our clinic due to closed wounds. 2. Follow-up as needed Electronic Signature(s) Signed: 12/24/2020 1:32:17 PM By:  Kayla Shan DO Entered By: Kayla [name] on 12/24/2020 13:31:13 -------------------------------------------------------------------------------- HxROS Details Patient Name: Date of Service: Kayla [name], TORIANN A. 12/24/2020 10:30 A M Medical Record Number: [id number] Patient Account Number: [id number] Date of Birth/Sex: Treating RN: 06/12/1941 (80 y.o. Kayla [name] Primary Care Provider: Jill [name] Other Clinician: Referring Provider: Treating Provider/Extender: Kayla [name] in Treatment: 1 Information Obtained From Patient Eyes Medical History: Negative for: Cataracts; Glaucoma; Optic Neuritis Ear/Nose/Mouth/Throat Medical History: Negative for: Chronic sinus problems/congestion; Middle ear problems Hematologic/Lymphatic Medical History: Positive for: Lymphedema Negative for: Anemia; Hemophilia; Human Immunodeficiency Virus; Sickle Cell Disease Respiratory Medical History: Negative for: Aspiration; Asthma; Chronic Obstructive Pulmonary Disease (COPD); Pneumothorax; Sleep Apnea; Tuberculosis Cardiovascular Medical History: Positive  for: Hypertension Negative for: Angina; Arrhythmia; Congestive Heart Failure; Coronary Artery Disease; Deep Vein Thrombosis; Hypotension; Myocardial Infarction; Peripheral Arterial Disease; Peripheral Venous Disease; Phlebitis; Vasculitis Past Medical History Notes: hyperlipidemia Gastrointestinal Medical History: Negative for: Cirrhosis ; Colitis; Crohns; Hepatitis A; Hepatitis B; Hepatitis C Past Medical History Notes: GERD Endocrine Medical History: Positive for: Type II Diabetes Negative for: Type I Diabetes Genitourinary Medical History: Negative for: End Stage Renal Disease Immunological Medical History: Negative for: Lupus Erythematosus; Raynauds; Scleroderma Integumentary (Skin) Medical History: Negative for: History of Burn Musculoskeletal Medical History: Positive for: Rheumatoid Arthritis; Osteoarthritis Negative for: Gout; Osteomyelitis Neurologic Medical History: Positive for: Neuropathy Negative for: Dementia; Quadriplegia; Paraplegia; Seizure Disorder Oncologic Medical History: Negative for: Received Chemotherapy; Received Radiation Immunizations Pneumococcal Vaccine: Received Pneumococcal Vaccination: Yes Implantable Devices None Family and Social History Unknown History: Yes; Never smoker; Marital Status - Widowed; Alcohol Use: Never; Drug Use: No History; Caffeine Use: Moderate; Financial Concerns: No; Food, Clothing or Shelter Needs: No; Support System Lacking: No; Transportation Concerns: No Electronic Signature(s) Signed: 12/24/2020 1:31:18 PM By: Lorrin Jackson Signed: 12/24/2020 1:32:17 PM By: Kayla Shan DO Entered By: Kayla [name] on 12/24/2020 13:28:11 -------------------------------------------------------------------------------- SuperBill Details Patient Name: Date of Service: Kayla [name], [place] A. 12/24/2020 Medical Record Number: [id number] Patient Account Number: [id number] Date of Birth/Sex: Treating RN: [birth year] (80 y.o. Kayla [name] Primary Care Provider: Jill [name] Other Clinician: Referring Provider: Treating Provider/Extender: Kayla [name] in Treatment: 1 Diagnosis Coding ICD-10 Codes Code Description [phone] Non-pressure chronic ulcer of other part of right lower leg with unspecified severity L03.116 Cellulitis of left lower limb I89.0 Lymphedema, not elsewhere classified I87.2 Venous insufficiency (chronic) (peripheral) E11.9 Type 2 diabetes mellitus without complications Facility Procedures CPT4 Code: [id number] Description: 9212 - WOUND CARE VISIT-LEV 2 EST PT Modifier: Quantity: 1 Physician Procedures : CPT4 Code Description Modifier [id number] - WC PHYS LEVEL 3 - EST PT ICD-10 Diagnosis Description L97.819 Non-pressure chronic ulcer of other part of right lower leg with unspecified severity I89.0 Lymphedema, not elsewhere classified I87.2 Venous  insufficiency (chronic) (peripheral) Quantity: 1 Electronic Signature(s) Signed: 12/24/2020 1:32:17 PM By: Kayla Shan DO Previous Signature: 12/24/2020 1:31:18 PM Version By: Lorrin Jackson Entered By: Kayla [name] on 12/24/2020 13:31:32

## 2020-12-28 ENCOUNTER — Telehealth: Payer: Self-pay | Admitting: Family Medicine

## 2020-12-28 ENCOUNTER — Other Ambulatory Visit: Payer: Self-pay | Admitting: Family Medicine

## 2020-12-28 DIAGNOSIS — I152 Hypertension secondary to endocrine disorders: Secondary | ICD-10-CM

## 2020-12-28 DIAGNOSIS — E1140 Type 2 diabetes mellitus with diabetic neuropathy, unspecified: Secondary | ICD-10-CM

## 2020-12-28 DIAGNOSIS — E1159 Type 2 diabetes mellitus with other circulatory complications: Secondary | ICD-10-CM

## 2020-12-28 MED ORDER — GLUCOSE BLOOD VI STRP: 1.0000 | ORAL_STRIP | 3 refills | Status: DC | PRN

## 2020-12-28 MED ORDER — METFORMIN HCL 500 MG PO TABS
1.0000 | ORAL_TABLET | Freq: Two times a day (BID) | ORAL | 1 refills | Status: DC
Start: 2020-12-28 — End: 2021-12-03

## 2020-12-28 NOTE — Telephone Encounter (Signed)
Pt needs refill on Metformin and the glucose test strips sent to the [facility] on [address].

## 2020-12-29 ENCOUNTER — Telehealth: Payer: Self-pay | Admitting: Family Medicine

## 2020-12-29 ENCOUNTER — Other Ambulatory Visit: Payer: Self-pay

## 2020-12-29 DIAGNOSIS — I152 Hypertension secondary to endocrine disorders: Secondary | ICD-10-CM

## 2020-12-29 DIAGNOSIS — E1159 Type 2 diabetes mellitus with other circulatory complications: Secondary | ICD-10-CM

## 2020-12-29 MED ORDER — CARVEDILOL 3.125 MG PO TABS: 3.1250 mg | ORAL_TABLET | Freq: Two times a day (BID) | ORAL | 0 refills | Status: DC

## 2020-12-29 NOTE — Telephone Encounter (Signed)
Pt called and requested refills on Carvedilol. Please send to [facility] [address]

## 2020-12-29 NOTE — Telephone Encounter (Signed)
Done KH 

## 2020-12-31 ENCOUNTER — Other Ambulatory Visit: Payer: Self-pay

## 2020-12-31 MED ORDER — GLUCOSE BLOOD VI STRP: 1.0000 | ORAL_STRIP | 3 refills | Status: DC | PRN

## 2021-01-04 ENCOUNTER — Encounter: Payer: Self-pay | Admitting: Podiatry

## 2021-01-04 ENCOUNTER — Ambulatory Visit (INDEPENDENT_AMBULATORY_CARE_PROVIDER_SITE_OTHER): Payer: Medicare PPO | Admitting: Podiatry

## 2021-01-04 ENCOUNTER — Other Ambulatory Visit: Payer: Self-pay

## 2021-01-04 DIAGNOSIS — M79676 Pain in unspecified toe(s): Secondary | ICD-10-CM | POA: Diagnosis not present

## 2021-01-04 DIAGNOSIS — E1142 Type 2 diabetes mellitus with diabetic polyneuropathy: Secondary | ICD-10-CM | POA: Diagnosis not present

## 2021-01-04 DIAGNOSIS — B351 Tinea unguium: Secondary | ICD-10-CM | POA: Diagnosis not present

## 2021-01-04 DIAGNOSIS — L84 Corns and callosities: Secondary | ICD-10-CM | POA: Diagnosis not present

## 2021-01-06 ENCOUNTER — Other Ambulatory Visit: Payer: Self-pay

## 2021-01-06 DIAGNOSIS — I739 Peripheral vascular disease, unspecified: Secondary | ICD-10-CM

## 2021-01-09 NOTE — Progress Notes (Signed)
Subjective: Kayla [name] is a pleasant 80 y.o. female patient seen today for at-risk foot care with h/o diabetic neuropathy with painful thick toenails that are difficult to trim. Pain interferes with ambulation. Aggravating factors include wearing enclosed shoe gear. Pain is relieved with periodic professional debridement. Patient also has b/l heel calluses which are relieved with periodic debridement.   She states her blood glucose was 114 mg/dl today.  She states she has been to Wound Care for wounds of b/l LE. She also states she will be seeing a Vascular specialist.  PCP is Denita Lung, MD. Last visit was: 12/09/2020.  No Known Allergies  Objective: Physical Exam  General: Kayla [name] is a pleasant 80 y.o. African American female, morbidly obese in NAD. AAO x 3.   Vascular:  Capillary fill time to digits <3 seconds b/l lower extremities. Faintly palpable DP pulse(s) b/l lower extremities. Faintly palpable PT pulse(s) b/l lower extremities. Pedal hair absent. Lower extremity skin temperature gradient within normal limits. Evidence of chronic venous insufficiency b/l lower extremities.  Dermatological:  Pedal skin with normal turgor, texture and tone bilaterally. No open wounds bilaterally. No interdigital macerations bilaterally. Toenails 1-5 b/l elongated, discolored, dystrophic, thickened, crumbly with subungual debris and tenderness to dorsal palpation. Hyperkeratotic lesion(s) plantar aspect b/l heel pads.  No erythema, no edema, no drainage, no fluctuance.  Musculoskeletal:  Muscle strength 4/5to all LE muscle groups of b/l lower extremities. No pain crepitus or joint limitation noted with ROM b/l. Pes planus deformity noted b/l.  Utilizes wheelchair for mobility assistance.  Neurological:  Pt has subjective symptoms of neuropathy. Protective sensation intact 5/5 intact bilaterally with 10g monofilament b/l. Vibratory sensation intact b/l.  Assessment and Plan:  1.  Pain due to onychomycosis of toenail   2. Callus   3. Diabetic peripheral neuropathy associated with type 2 diabetes mellitus ([place])     -Examined patient. -No new findings. No new orders. -Continue diabetic foot care principles. -Patient to continue soft, supportive shoe gear daily. -Toenails 1-5 b/l were debrided in length and girth with sterile nail nippers and dremel without iatrogenic bleeding.  -Callus(es) plantar aspect b/l heel pads pared utilizing sterile scalpel blade without complication or incident. Total number debrided =2. -Patient to report any pedal injuries to medical professional immediately. -Patient/POA to call should there be question/concern in the interim.  Return in about 3 months (around 04/06/2021).  Marzetta Board, DPM

## 2021-01-10 ENCOUNTER — Telehealth: Payer: Self-pay

## 2021-01-10 ENCOUNTER — Ambulatory Visit: Payer: Medicare PPO | Admitting: Family Medicine

## 2021-01-10 VITALS — BP 120/78 | HR 78 | Temp 97.9°F

## 2021-01-10 DIAGNOSIS — I890 Lymphedema, not elsewhere classified: Secondary | ICD-10-CM | POA: Diagnosis not present

## 2021-01-10 DIAGNOSIS — R3589 Other polyuria: Secondary | ICD-10-CM | POA: Diagnosis not present

## 2021-01-10 NOTE — Progress Notes (Signed)
   Subjective:    Patient ID: Kayla [name], female    DOB: [birth year], 80 y.o.   MRN: [id number]  HPI She is here for a recheck.  Since her last visit she has been seen by the wound care clinic.  That note was reviewed and essentially recommended that she did not have evidence of an infection but definitely did need her support stockings and her leg wrapped.  She states she has difficulty doing it on her own and would like help on a daily basis.  She did mention the fact that she needs much more help with doing ADLs than she has in the past.  She is going to call the home health and see if they can help on a daily basis.  I then mentioned I think that it would be worthwhile for her to consider going into assisted living.  Apparently her son is said the same thing but she is not interested. She then mentioned difficulty with excessive urination.    Review of Systems     Objective:   Physical Exam Alert and in no distress.  Exam of her lower extremity does show diffuse edema but no drainage warmth or tenderness.       Assessment & Plan:  Lymphedema  Polyuria She will work with home health to see if they can come out on a daily basis. Also instructed on getting a urine specimen and then bring it Back here. Over 30 minutes spent discussing this and coordinating her care.

## 2021-01-10 NOTE — Telephone Encounter (Signed)
Pt son was mailed note and AVS. 01/10/21 [place]

## 2021-01-24 ENCOUNTER — Ambulatory Visit (HOSPITAL_COMMUNITY)
Admission: RE | Admit: 2021-01-24 | Discharge: 2021-01-24 | Disposition: A | Discharge status: Home / Self Care | Payer: Medicare PPO | Source: Ambulatory Visit | Attending: Surgery | Admitting: Surgery

## 2021-01-24 ENCOUNTER — Encounter: Payer: Self-pay | Admitting: Surgery

## 2021-01-24 ENCOUNTER — Other Ambulatory Visit: Payer: Self-pay

## 2021-01-24 ENCOUNTER — Ambulatory Visit: Payer: Medicare PPO | Admitting: Surgery

## 2021-01-24 VITALS — BP 172/77 | HR 68 | Temp 97.7°F | Resp 20 | Ht 64.0 in | Wt 230.0 lb

## 2021-01-24 DIAGNOSIS — I890 Lymphedema, not elsewhere classified: Secondary | ICD-10-CM | POA: Diagnosis not present

## 2021-01-24 DIAGNOSIS — I739 Peripheral vascular disease, unspecified: Secondary | ICD-10-CM | POA: Insufficient documentation

## 2021-01-24 NOTE — Progress Notes (Addendum)
[facility] [place]  Patient name: Kayla [name] MRN: [id number] DOB: [birth year] Sex: female   REQUESTING PROVIDER:    Dr. Heber Fairview   REASON FOR CONSULT:    Venous insufficiency  HISTORY OF PRESENT ILLNESS:   Kayla [name] is a 80 y.o. female, who is referred for evaluation of chronic venous insufficiency with a wound.  She states that she has only been having swelling for approximately 5 years.  She also noticed discoloration of her feet which is relatively new.  She has been unable to tolerate compression therapy.  She is having a fair amount of pain in her feet.    Patient suffers from morbid obesity.  She has type 2 diabetes and history of pulmonary embolism.  PAST MEDICAL HISTORY    Past Medical History:  Diagnosis Date   Arthritis    Diabetes mellitus    Hypertension    Obesity    Pulmonary embolus (HCC)      FAMILY HISTORY   Family History  Problem Relation Age of Onset   Hypertension Mother     SOCIAL HISTORY:   Social History   Socioeconomic History   Marital status: Married    Spouse name: Not on file   Number of children: Not on file   Years of education: Not on file   Highest education level: Not on file  Occupational History   Not on file  Tobacco Use   Smoking status: Never   Smokeless tobacco: Never  Vaping Use   Vaping Use: Never used  Substance and Sexual Activity   Alcohol use: No   Drug use: No   Sexual activity: Not Currently  Other Topics Concern   Not on file  Social History Narrative   Not on file   Social Determinants of Health   Financial Resource Strain: Not on file  Food Insecurity: Not on file  Transportation Needs: Not on file  Physical Activity: Not on file  Stress: Not on file  Social Connections: Not on file  Intimate Partner Violence: Not on file    ALLERGIES:    No Known Allergies  CURRENT MEDICATIONS:    Current Outpatient Medications   Medication Sig Dispense Refill   Accu-Chek Softclix Lancets lancets 1 each by Other route daily. Use as instructed 100 each 6   ammonium lactate (LAC-HYDRIN) 12 % lotion Apply 1 application topically as needed for dry skin. 400 g 5   aspirin ([employer] ASPIRIN EC) 325 MG EC tablet USE PER THE INSTRUCTIONS ON PACKAGE LABEL 125 tablet 0   atorvastatin (LIPITOR) 80 MG tablet Take 1 tablet (80 mg total) by mouth daily. 90 tablet 1   augmented betamethasone dipropionate (DIPROLENE-AF) 0.05 % ointment      carvedilol (COREG) 3.125 MG tablet Take 1 tablet (3.125 mg total) by mouth 2 (two) times daily. 180 tablet 0   diclofenac Sodium (VOLTAREN) 1 % GEL Apply 2 g topically 4 (four) times daily. Rub into affected area of foot 2 to 4 times daily 100 g 2   glucose blood test strip 1 each by Other route as needed for other. Use as instructed 100 each 3   HYDROcodone-acetaminophen (NORCO/VICODIN) 5-325 MG tablet Take 1 tablet by mouth every 6 (six) hours as needed for moderate pain or severe pain. 10 tablet 0   losartan-hydrochlorothiazide (HYZAAR) 100-12.5 MG tablet TAKE 1 TABLET BY MOUTH EVERY DAY 90 tablet 0   metFORMIN (GLUCOPHAGE) 500 MG tablet Take 1 tablet (500 mg  total) by mouth 2 (two) times daily with a meal. 180 tablet 1   TRIAMCINOLONE ACETONIDE, TOP, 0.05 % OINT Apply to rough skin on heels twice daily 430 g 1   diclofenac (VOLTAREN) 75 MG EC tablet Take 1 tablet (75 mg total) by mouth 2 (two) times daily. (Patient not taking: No sig reported) 30 tablet 0   No current facility-administered medications for this visit.    REVIEW OF SYSTEMS:   [X]  denotes positive finding, [ ]  denotes negative finding Cardiac  Comments:  Chest pain or chest pressure:    Shortness of breath upon exertion:    Short of breath when lying flat:    Irregular heart rhythm:        Vascular    Pain in calf, thigh, or hip brought on by ambulation:    Pain in feet at night that wakes you up from your sleep:     Blood  clot in your veins:    Leg swelling:  x       Pulmonary    Oxygen at home:    Productive cough:     Wheezing:         Neurologic    Sudden weakness in arms or legs:     Sudden numbness in arms or legs:     Sudden onset of difficulty speaking or slurred speech:    Temporary loss of vision in one eye:     Problems with dizziness:         Gastrointestinal    Blood in stool:      Vomited blood:         Genitourinary    Burning when urinating:     Blood in urine:        Psychiatric    Major depression:         Hematologic    Bleeding problems:    Problems with blood clotting too easily:        Skin    Rashes or ulcers:        Constitutional    Fever or chills:     PHYSICAL EXAM:   Vitals:   01/24/21 1234  BP: (!) 172/77  Pulse: 68  Resp: 20  Temp: 97.7 F (36.5 C)  SpO2: 97%  Weight: 230 lb (104.3 kg)  Height: 5\' 4"  (1.626 m)    GENERAL: The patient is a well-nourished female, in no acute distress. The vital signs are documented above. CARDIAC: There is a regular rate and rhythm.  VASCULAR: Chronic bilateral edema with hyperpigmentation beginning in the mid calf out distally.  There is a small ulcer on the left leg at the ankle PULMONARY: Nonlabored respirations ABDOMEN: Soft and non-tender with normal pitched bowel sounds.  MUSCULOSKELETAL: There are no major deformities or cyanosis. NEUROLOGIC: No focal weakness or paresthesias are detected. SKIN: There are no ulcers or rashes noted. PSYCHIATRIC: The patient has a normal affect.  STUDIES:   I have reviewed the following: +-------+----------------+-----------+------------+------------+  ABI/TBIToday's ABI     Today's TBIPrevious ABIPrevious TBI  +-------+----------------+-----------+------------+------------+  Right  0.99                                                 +-------+----------------+-----------+------------+------------+  Left   Non-compressible                                      +-------+----------------+-----------+------------+------------+  ASSESSMENT and PLAN   Bilateral leg swelling with ulcer: This does not look like venous insufficiency but rather chronic lymphedema.  I have told her that she needs to keep her legs elevated when possible and utilize compression.  I doubt she is going to be able to put compression stockings on by herself.  I think she would be an excellent candidate for lymphedema pump.  She can not exercise due to body habitus.  She walks with a walker.  I will try to facilitate this.  She does have chronic pain in her foot.  I told her to speak to her primary care physician regarding management of this.  She states that she has a new wound on her foot, therefore I will get her back again with the wound center if this does not heal in short order.  She does have adequate blood flow to heal a wound.  Venous insufficiency testing was not performed today as we could not get the patient onto the table and she was afraid of getting into the [facility] lift.  I do not plan on repeating this as I think her swelling is mainly from lymphedema.   Leia Alf, MD, FACS [facility] [phone] Pager [phone]

## 2021-01-25 ENCOUNTER — Telehealth: Payer: Self-pay

## 2021-01-25 NOTE — Telephone Encounter (Signed)
Pt called for pain med for foot. She advised that she saw Vas. Yesterday. Pt was advised that a message could be sent to Dr. Redmond School about this but pt advised she did not want any more pain med that she had to take by mouth. Pt was also advised that a referral for more wound care was put in at her appointment yesterday and to make sure she got scheduled.  Blima Singer RMA

## 2021-02-04 ENCOUNTER — Telehealth: Payer: Self-pay

## 2021-02-04 NOTE — Telephone Encounter (Signed)
Patient's son calls today to request assistance for patient's lymphedema pump - someone to come out twice a day and help put it on for her. Advised him that [employer] comes out, provides a demonstration and HH was not involved. He stated, "so what you're saying is no one needed help with this ever?" I advised him that I didn't know, but I was unaware of any [place] agency that would do that, and we would not set that up. He says he will pursue another avenue.

## 2021-02-04 NOTE — Telephone Encounter (Signed)
Pt. Son called stating that she got a leg pump for the swelling in her legs from her Vascular doctor. They demonstrated how to use to the pt. In her home but she is unable to do it herself. The son said vascular did not assist them on what he should do for her to get help with the leg pump. He called the pts. [employer] [place]  and they said they could send someone out to her house to assist her twice a day for 1 hr. Each time and has to be at least 6 hrs between each time. He said they told him we would have to do a PA for the services. He gave me the [place] [phone]. I saw in the Vascular notes she was referred to wound care as well they contacted her but she did not answer and was never scheduled for that. I called the son back to let him know that and to let him know we contacted wound care to see if they could assist with the PA had to LM and also stated that you would contact him next week about this.

## 2021-02-07 NOTE — Telephone Encounter (Signed)
Wound Care called back stating that the pt. Is scheduled with them on 02/10/21 at 1:15. I asked them if they could help with home health which the son had requested. She said she would send a nurse the message and have them call back here if they could help with that or not.

## 2021-02-07 NOTE — Telephone Encounter (Signed)
A nurse from wound care called back stating that they would be able to order home health for the pt. Because they did not order the leg pump for the pt. And she would need a home health nurse to come out daily to help her with that and they didn't think they could find one to come out daily twice a day to help with her leg pump.

## 2021-02-09 ENCOUNTER — Telehealth: Payer: Self-pay

## 2021-02-09 DIAGNOSIS — R609 Edema, unspecified: Secondary | ICD-10-CM

## 2021-02-09 DIAGNOSIS — I890 Lymphedema, not elsewhere classified: Secondary | ICD-10-CM

## 2021-02-09 NOTE — Telephone Encounter (Signed)
Called to see if we can get home health for pt . Kayla [name]

## 2021-02-10 ENCOUNTER — Telehealth: Payer: Self-pay

## 2021-02-10 ENCOUNTER — Other Ambulatory Visit: Payer: Self-pay

## 2021-02-10 ENCOUNTER — Encounter (HOSPITAL_BASED_OUTPATIENT_CLINIC_OR_DEPARTMENT_OTHER): Payer: Medicare PPO | Attending: Internal Medicine | Admitting: Internal Medicine

## 2021-02-10 DIAGNOSIS — I872 Venous insufficiency (chronic) (peripheral): Secondary | ICD-10-CM | POA: Diagnosis not present

## 2021-02-10 DIAGNOSIS — E11622 Type 2 diabetes mellitus with other skin ulcer: Secondary | ICD-10-CM | POA: Insufficient documentation

## 2021-02-10 DIAGNOSIS — I890 Lymphedema, not elsewhere classified: Secondary | ICD-10-CM | POA: Diagnosis not present

## 2021-02-10 DIAGNOSIS — L97822 Non-pressure chronic ulcer of other part of left lower leg with fat layer exposed: Secondary | ICD-10-CM | POA: Insufficient documentation

## 2021-02-10 DIAGNOSIS — L97829 Non-pressure chronic ulcer of other part of left lower leg with unspecified severity: Secondary | ICD-10-CM

## 2021-02-10 DIAGNOSIS — E119 Type 2 diabetes mellitus without complications: Secondary | ICD-10-CM | POA: Diagnosis not present

## 2021-02-10 NOTE — Progress Notes (Signed)
AASIA, PEAVLER ([id number]) Visit Report for 02/10/2021 Abuse/Suicide Risk Screen Details Patient Name: Date of Service: Kayla [name], [name] 02/10/2021 1:15 PM Medical Record Number: [id number] Patient Account Number: [id number] Date of Birth/Sex: Treating RN: 02/26/1941 (80 y.o. [name] Primary Care Hennessy Bartel: Jill Alexanders Other Clinician: Referring Jamarria Real: Treating Sharena Dibenedetto/Extender: [name] in Treatment: 0 Abuse/Suicide Risk Screen Items Answer ABUSE RISK SCREEN: Has anyone close to you tried to hurt or harm you recentlyo No Do you feel uncomfortable with anyone in your familyo No Has anyone forced you do things that you didnt want to doo No Electronic Signature(s) Signed: 02/10/2021 6:01:46 PM By: Lorrin Jackson Entered By: Lorrin Jackson on 02/10/2021 13:41:06 -------------------------------------------------------------------------------- Activities of Daily Living Details Patient Name: Date of Service: Kayla [name], Kayla A. 02/10/2021 1:15 PM Medical Record Number: [id number] Patient Account Number: [id number] Date of Birth/Sex: Treating RN: March 28, 1941 (80 y.o. [name] Primary Care [place] Will[place]: Jill Alexanders Other Clinician: Referring Larayah Clute: Treating Loreena Valeri/Extender: [name] in Treatment: 0 Activities of Daily Living Items Answer Activities of Daily Living (Please select one for each item) Drive Automobile Not Able T Medications ake Need Assistance Use T elephone Completely Able Care for Appearance Need Assistance Use T oilet Need Assistance Bath / Shower Need Assistance Dress Self Need Assistance Feed Self Completely Able Walk Need Assistance Get In / Out Bed Need Assistance Housework Need Assistance Prepare Meals Need Assistance Handle Money Need Assistance Shop for Self Need Assistance Electronic Signature(s) Signed: 02/10/2021 6:01:46 PM By: Lorrin Jackson Entered By: Lorrin Jackson  on 02/10/2021 13:41:58 -------------------------------------------------------------------------------- Education Screening Details Patient Name: Date of Service: Kayla [name], Kayla A. 02/10/2021 1:15 PM Medical Record Number: [id number] Patient Account Number: [id number] Date of Birth/Sex: Treating RN: January 09, 1941 (80 y.o. [name] Primary Care Cheikh Bramble: Jill Alexanders Other Clinician: Referring Khiley Lieser: Treating Squire Withey/Extender: [name] in Treatment: 0 Primary Learner Assessed: Patient Learning Preferences/Education Level/Primary Language Learning Preference: Explanation, Demonstration, Printed Material Highest Education Level: High School Preferred Language: English Cognitive Barrier Language Barrier: No Translator Needed: No Memory Deficit: No Emotional Barrier: No Cultural/Religious Beliefs Affecting Medical Care: No Physical Barrier Impaired Vision: Yes Glasses Impaired Hearing: No Decreased Hand dexterity: No Knowledge/Comprehension Knowledge Level: High Comprehension Level: High Ability to understand written instructions: High Ability to understand verbal instructions: High Motivation Anxiety Level: Calm Cooperation: Cooperative Education Importance: Acknowledges Need Interest in Health Problems: Asks Questions Perception: Coherent Willingness to Engage in Self-Management High Activities: Readiness to Engage in Self-Management High Activities: Electronic Signature(s) Signed: 02/10/2021 6:01:46 PM By: Lorrin Jackson Entered By: Lorrin Jackson on 02/10/2021 13:42:55 -------------------------------------------------------------------------------- Fall Risk Assessment Details Patient Name: Date of Service: Kayla [name], Kayla A. 02/10/2021 1:15 PM Medical Record Number: [id number] Patient Account Number: [id number] Date of Birth/Sex: Treating RN: 1941-01-01 (80 y.o. [name] Primary Care Celenia Hruska: Jill Alexanders Other  Clinician: Referring Kelsa Jaworowski: Treating Thanos Cousineau/Extender: [name] in Treatment: 0 Fall Risk Assessment Items Have you had 2 or more falls in the last 12 monthso 0 No Have you had any fall that resulted in injury in the last 12 monthso 0 No FALLS RISK SCREEN History of falling - immediate or within 3 months 0 No Secondary diagnosis (Do you have 2 or more medical diagnoseso) 0 No Ambulatory aid None/bed rest/wheelchair/nurse 0 No Crutches/cane/walker 15 Yes Furniture 0 No Intravenous therapy Access/Saline/Heparin Lock 0 No Gait/Transferring Normal/ bed rest/ wheelchair 0 Yes Weak (short steps with or without shuffle, stooped but  able to lift head while walking, may seek 0 No support from furniture) Impaired (short steps with shuffle, may have difficulty arising from chair, head down, impaired 0 No balance) Mental Status Oriented to own ability 0 Yes Electronic Signature(s) Signed: 02/10/2021 6:01:46 PM By: Lorrin Jackson Entered By: Lorrin Jackson on 02/10/2021 13:43:14 -------------------------------------------------------------------------------- Foot Assessment Details Patient Name: Date of Service: Kayla [name], Kayla A. 02/10/2021 1:15 PM Medical Record Number: [id number] Patient Account Number: [id number] Date of Birth/Sex: Treating RN: 04-30-1941 (80 y.o. [name] Primary Care Ej Pinson: Jill Alexanders Other Clinician: Referring Sianni Cloninger: Treating Abdullahi Vallone/Extender: [name] in Treatment: 0 Foot Assessment Items Site Locations + = Sensation present, - = Sensation absent, C = Callus, U = Ulcer R = Redness, W = Warmth, M = Maceration, PU = Pre-ulcerative lesion F = Fissure, S = Swelling, D = Dryness Assessment Right: Left: Other Deformity: No No Prior Foot Ulcer: No No Prior Amputation: No No Charcot Joint: No No Ambulatory Status: Ambulatory With Help Assistance Device: Walker Gait: Steady Electronic  Signature(s) Signed: 02/10/2021 6:01:46 PM By: Lorrin Jackson Entered By: Lorrin Jackson on 02/10/2021 13:44:14 -------------------------------------------------------------------------------- Nutrition Risk Screening Details Patient Name: Date of Service: Kayla [name], Kayla A. 02/10/2021 1:15 PM Medical Record Number: [id number] Patient Account Number: [id number] Date of Birth/Sex: Treating RN: 18-May-1941 (80 y.o. [name] Primary Care Jesika Men: Jill Alexanders Other Clinician: Referring Lanika Colgate: Treating Adahlia Stembridge/Extender: [name] in Treatment: 0 Height (in): Weight (lbs): Body Mass Index (BMI): Nutrition Risk Screening Items Score Screening NUTRITION RISK SCREEN: I have an illness or condition that made me change the kind and/or amount of food I eat 0 No I eat fewer than two meals per day 0 No I eat few fruits and vegetables, or milk products 0 No I have three or more drinks of beer, liquor or wine almost every day 0 No I have tooth or mouth problems that make it hard for me to eat 0 No I don't always have enough money to buy the food I need 0 No I eat alone most of the time 0 No I take three or more different prescribed or over-the-counter drugs a day 1 Yes Without wanting to, I have lost or gained 10 pounds in the last six months 0 No I am not always physically able to shop, cook and/or feed myself 0 No Nutrition Protocols Good Risk Protocol Moderate Risk Protocol High Risk Proctocol Risk Level: Good Risk Score: 1 Electronic Signature(s) Signed: 02/10/2021 6:01:46 PM By: Lorrin Jackson Entered By: Lorrin Jackson on 02/10/2021 13:43:37

## 2021-02-10 NOTE — Telephone Encounter (Signed)
I called the vein and vascular specialists office at [phone] per your request and I had to LM on the nurses line to call back to our office with her wound vac settings.

## 2021-02-10 NOTE — Telephone Encounter (Signed)
Sent to home health and lvm for Jenny Reichmann of [employer] for settings. [place]

## 2021-02-11 DIAGNOSIS — L89899 Pressure ulcer of other site, unspecified stage: Secondary | ICD-10-CM | POA: Diagnosis not present

## 2021-02-11 NOTE — Progress Notes (Signed)
Kayla, [name] ([id number]) Visit Report for 02/10/2021 Chief Complaint Document Details Patient Name: Date of Service: Kayla [name], [name] 02/10/2021 1:15 PM Medical Record Number: [id number] Patient Account Number: [id number] Date of Birth/Sex: Treating RN: [birth year] (80 y.o. [name], [name] Primary Care Provider: Jill Alexanders Other Clinician: Referring Provider: Treating Provider/Extender: [name] in Treatment: 0 Information Obtained from: Patient Chief Complaint left lower extremity wounds Electronic Signature(s) Signed: 02/11/2021 1:44:29 PM By: Kalman Shan DO Entered By: Kalman Shan on 02/11/2021 13:34:02 -------------------------------------------------------------------------------- HPI Details Patient Name: Date of Service: Kayla [name], Kayla A. 02/10/2021 1:15 PM Medical Record Number: [id number] Patient Account Number: [id number] Date of Birth/Sex: Treating RN: Jun 23, 1941 (80 y.o. [name], [name] Primary Care Provider: Jill Alexanders Other Clinician: Referring Provider: Treating Provider/Extender: [name] in Treatment: 0 History of Present Illness HPI Description: Admission 5/20 Kayla [name] is an 80 year old female with a past medical history of type 2 diabetes on oral agents, chronic venous insufficiency and lymphedema that presents to our clinic for bilateral lower extremity swelling with a small open wound to the right lower extremity. She states that her swelling has increased over the past year. She does not use compression stockings although she does own some. She states these are too hard to get on. She tells me she does not have a history of open wounds however in the EMR it is noted that she has had several cases of open wounds to her legs. She reports pain to shin of her left lower extremity. She is currently on doxycycline for cellulitis of her calf. She states that the pain to her shin  has been present for the past 6 months and has not gotten better. There are no open wounds to the left lower extremity. 5/27; patient presents for 1 week follow-up. Her wound has healed on her right lower extremity. She has no open wounds to her left lower extremity. She had an ultrasound of an suspicious area last week on the left lower extremity that could possibly have been an abscess. She received her juxta light compression and has it today. She denies signs of infection. Admission 7/14 Kayla [name] presents for a left lower extremity wound That occurred 2 weeks ago. She was evaluated by vein and vascular and she states that they are ordering her lymphedema pumps. She is currently keeping the areas covered. She denies signs of infection. She reports tenderness to the area. Electronic Signature(s) Signed: 02/11/2021 1:44:29 PM By: Kalman Shan DO Entered By: Kalman Shan on 02/11/2021 13:37:37 -------------------------------------------------------------------------------- Physical Exam Details Patient Name: Date of Service: Kayla [name], Kayla A. 02/10/2021 1:15 PM Medical Record Number: [id number] Patient Account Number: [id number] Date of Birth/Sex: Treating RN: 1940-11-04 (80 y.o. [name], [name] Primary Care Provider: Jill Alexanders Other Clinician: Referring Provider: Treating Provider/Extender: [name] in Treatment: 0 Constitutional respirations regular, non-labored and within target range for patient.. Cardiovascular 2+ dorsalis pedis/posterior tibialis pulses. Psychiatric pleasant and cooperative. Notes Left lower extremity: 3+ pitting edema to the knees. Open wounds to the anterior and posterior aspect with nonviable tissue and granulation tissue present. There are lymphedema skin changes present. No obvious signs of infection. Electronic Signature(s) Signed: 02/11/2021 1:44:29 PM By: Kalman Shan DO Entered By: Kalman Shan on  02/11/2021 13:38:26 -------------------------------------------------------------------------------- Physician Orders Details Patient Name: Date of Service: Kayla [name], Kayla A. 02/10/2021 1:15 PM Medical Record Number: [id number] Patient Account Number: [id number] Date of Birth/Sex: Treating RN: [birth year] (80 y.o. F)  Lorrin Jackson Primary Care Provider: Jill Alexanders Other Clinician: Referring Provider: Treating Provider/Extender: [name] in Treatment: 0 Verbal / Phone Orders: No Diagnosis Coding ICD-10 Coding Code Description [phone] Non-pressure chronic ulcer of other part of left lower leg with unspecified severity I89.0 Lymphedema, not elsewhere classified I87.2 Venous insufficiency (chronic) (peripheral) E11.9 Type 2 diabetes mellitus without complications Follow-up Appointments ppointment in 1 week. - Dr. Heber  Return A Discharge From [facility] Services Discharge from [place] Bathing/ Shower/ Hygiene May shower with protection but do not get wound dressing(s) wet. Edema Control - Lymphedema / SCD / Other Lymphedema Pumps. Use Lymphedema pumps on leg(s) 2-3 times a day for 45-60 minutes. If wearing any wraps or hose, do not remove them. Continue exercising as instructed. - [facility] has ordered pumps for patient. Elevate legs to the level of the heart or above for 30 minutes daily and/or when sitting, a frequency of: Avoid standing for long periods of time. Exercise regularly Compression stocking or Garment 20-30 mm/Hg pressure to: - Wear Juxtalite to right leg, will order one for left leg. Additional Orders / Instructions Follow Nutritious Diet Wound Treatment Wound #2 - Lower Leg Wound Laterality: Left, Anterior Cleanser: Soap and Water 1 x Per Week/15 Days Discharge Instructions: May shower and wash wound with dial antibacterial soap and water prior to dressing change. Cleanser: Wound Cleanser 1 x Per Week/15 Days Discharge Instructions:  Cleanse the wound with wound cleanser prior to applying a clean dressing using gauze sponges, not tissue or cotton balls. Peri-Wound Care: Triamcinolone 15 (g) 1 x Per Week/15 Days Discharge Instructions: Use triamcinolone 15 (g) as directed Peri-Wound Care: Sween Lotion (Moisturizing lotion) 1 x Per Week/15 Days Discharge Instructions: Apply moisturizing lotion as directed Prim Dressing: Hydrofera Blue Ready Foam, 4x5 in 1 x Per Week/15 Days ary Discharge Instructions: Apply to wound bed as instructed Prim Dressing: Santyl Ointment 1 x Per Week/15 Days ary Discharge Instructions: Apply nickel thick amount to wound bed as instructed Secondary Dressing: Woven Gauze Sponge, Non-Sterile 4x4 in 1 x Per Week/15 Days Discharge Instructions: Apply over primary dressing as directed. Secondary Dressing: ABD Pad, 5x9 1 x Per Week/15 Days Discharge Instructions: Apply over primary dressing as directed. Compression Wrap: Kerlix Roll 4.5x3.1 (in/yd) 1 x Per Week/15 Days Discharge Instructions: Apply Kerlix and Coban compression as directed. Compression Wrap: Coban Self-Adherent Wrap 4x5 (in/yd) 1 x Per Week/15 Days Discharge Instructions: Apply over Kerlix as directed. Wound #3 - Lower Leg Wound Laterality: Left, Posterior Cleanser: Soap and Water 1 x Per Week/15 Days Discharge Instructions: May shower and wash wound with dial antibacterial soap and water prior to dressing change. Cleanser: Wound Cleanser 1 x Per Week/15 Days Discharge Instructions: Cleanse the wound with wound cleanser prior to applying a clean dressing using gauze sponges, not tissue or cotton balls. Peri-Wound Care: Triamcinolone 15 (g) 1 x Per Week/15 Days Discharge Instructions: Use triamcinolone 15 (g) as directed Peri-Wound Care: Sween Lotion (Moisturizing lotion) 1 x Per Week/15 Days Discharge Instructions: Apply moisturizing lotion as directed Prim Dressing: Hydrofera Blue Ready Foam, 4x5 in 1 x Per Week/15  Days ary Discharge Instructions: Apply to wound bed as instructed Prim Dressing: Santyl Ointment 1 x Per Week/15 Days ary Discharge Instructions: Apply nickel thick amount to wound bed as instructed Secondary Dressing: Woven Gauze Sponge, Non-Sterile 4x4 in 1 x Per Week/15 Days Discharge Instructions: Apply over primary dressing as directed. Secondary Dressing: ABD Pad, 5x9 1 x Per Week/15 Days Discharge Instructions: Apply  over primary dressing as directed. Compression Wrap: Kerlix Roll 4.5x3.1 (in/yd) 1 x Per Week/15 Days Discharge Instructions: Apply Kerlix and Coban compression as directed. Compression Wrap: Coban Self-Adherent Wrap 4x5 (in/yd) 1 x Per Week/15 Days Discharge Instructions: Apply over Kerlix as directed. Compression Stockings: Circaid Juxta Lite Compression Wrap Left Leg Compression Amount: 20-30 mmHG Discharge Instructions: Apply Circaid Juxta Lite Compression Wrap daily as instructed. Apply first thing in the morning, remove at night before bed. Electronic Signature(s) Signed: 02/11/2021 1:44:29 PM By: Kalman Shan DO Signed: 02/11/2021 1:44:29 PM By: Kalman Shan DO Previous Signature: 02/10/2021 6:01:46 PM Version By: Lorrin Jackson Entered By: Kalman Shan on 02/11/2021 13:39:34 -------------------------------------------------------------------------------- Problem List Details Patient Name: Date of Service: [facility] [name], Kayla A. 02/10/2021 1:15 PM Medical Record Number: [id number] Patient Account Number: [id number] Date of Birth/Sex: Treating RN: 02-04-1941 (80 y.o. [name], [name] Primary Care Provider: Jill Alexanders Other Clinician: Referring Provider: Treating Provider/Extender: [name] in Treatment: 0 Active Problems ICD-10 Encounter Code Description Active Date MDM Diagnosis L97.829 Non-pressure chronic ulcer of other part of left lower leg with unspecified 02/10/2021 No Yes severity I89.0 Lymphedema, not  elsewhere classified 02/10/2021 No Yes I87.2 Venous insufficiency (chronic) (peripheral) 02/10/2021 No Yes E11.9 Type 2 diabetes mellitus without complications 2/53/6644 No Yes Inactive Problems Resolved Problems Electronic Signature(s) Signed: 02/11/2021 1:44:29 PM By: Kalman Shan DO Entered By: Kalman Shan on 02/11/2021 13:33:46 -------------------------------------------------------------------------------- Progress Note Details Patient Name: Date of Service: Kayla [name], Kayla A. 02/10/2021 1:15 PM Medical Record Number: [id number] Patient Account Number: [id number] Date of Birth/Sex: Treating RN: [birth year] (80 y.o. [name], [name] Primary Care Provider: Jill Alexanders Other Clinician: Referring Provider: Treating Provider/Extender: [name] in Treatment: 0 Subjective Chief Complaint Information obtained from Patient left lower extremity wounds History of Present Illness (HPI) Admission 5/20 Ms. Hadlea Furuya is an 80 year old female with a past medical history of type 2 diabetes on oral agents, chronic venous insufficiency and lymphedema that presents to our clinic for bilateral lower extremity swelling with a small open wound to the right lower extremity. She states that her swelling has increased over the past year. She does not use compression stockings although she does own some. She states these are too hard to get on. She tells me she does not have a history of open wounds however in the EMR it is noted that she has had several cases of open wounds to her legs. She reports pain to shin of her left lower extremity. She is currently on doxycycline for cellulitis of her calf. She states that the pain to her shin has been present for the past 6 months and has not gotten better. There are no open wounds to the left lower extremity. 5/27; patient presents for 1 week follow-up. Her wound has healed on her right lower extremity. She has no open  wounds to her left lower extremity. She had an ultrasound of an suspicious area last week on the left lower extremity that could possibly have been an abscess. She received her juxta light compression and has it today. She denies signs of infection. Admission 7/14 Ms. Picone presents for a left lower extremity wound That occurred 2 weeks ago. She was evaluated by vein and vascular and she states that they are ordering her lymphedema pumps. She is currently keeping the areas covered. She denies signs of infection. She reports tenderness to the area. Patient History Information obtained from Patient. Allergies No Known Allergies Family History Unknown History.  Social History Never smoker, Marital Status - Widowed, Alcohol Use - Never, Drug Use - No History, Caffeine Use - Moderate. Medical History Eyes Denies history of Cataracts, Glaucoma, Optic Neuritis Ear/Nose/Mouth/Throat Denies history of Chronic sinus problems/congestion, Middle ear problems Hematologic/Lymphatic Patient has history of Lymphedema Denies history of Anemia, Hemophilia, Human Immunodeficiency Virus, Sickle Cell Disease Respiratory Denies history of Aspiration, Asthma, Chronic Obstructive Pulmonary Disease (COPD), Pneumothorax, Sleep Apnea, Tuberculosis Cardiovascular Patient has history of Hypertension Denies history of Angina, Arrhythmia, Congestive Heart Failure, Coronary Artery Disease, Deep Vein Thrombosis, Hypotension, Myocardial Infarction, Peripheral Arterial Disease, Peripheral Venous Disease, Phlebitis, Vasculitis Gastrointestinal Denies history of Cirrhosis , Colitis, Crohnoos, Hepatitis A, Hepatitis B, Hepatitis C Endocrine Patient has history of Type II Diabetes Denies history of Type I Diabetes Genitourinary Denies history of End Stage Renal Disease Immunological Denies history of Lupus Erythematosus, Raynaudoos, Scleroderma Integumentary (Skin) Denies history of History of  Burn Musculoskeletal Patient has history of Rheumatoid Arthritis, Osteoarthritis Denies history of Gout, Osteomyelitis Neurologic Patient has history of Neuropathy Denies history of Dementia, Quadriplegia, Paraplegia, Seizure Disorder Oncologic Denies history of Received Chemotherapy, Received Radiation Medical A Surgical History Notes nd Cardiovascular hyperlipidemia Gastrointestinal GERD Objective Constitutional respirations regular, non-labored and within target range for patient.. Vitals Time Taken: 1:22 PM, Temperature: 98.2 F, Pulse: 71 bpm, Respiratory Rate: 18 breaths/min, Blood Pressure: 156/72 mmHg. Cardiovascular 2+ dorsalis pedis/posterior tibialis pulses. Psychiatric pleasant and cooperative. General Notes: Left lower extremity: 3+ pitting edema to the knees. Open wounds to the anterior and posterior aspect with nonviable tissue and granulation tissue present. There are lymphedema skin changes present. No obvious signs of infection. Integumentary (Hair, Skin) Wound #2 status is Open. Original cause of wound was Gradually Appeared. The date acquired was: 01/27/2021. The wound is located on the Left,Anterior Lower Leg. The wound measures 3.2cm length x 3.5cm width x 0.1cm depth; 8.796cm^2 area and 0.88cm^3 volume. There is Fat Layer (Subcutaneous Tissue) exposed. There is no tunneling or undermining noted. There is a medium amount of serosanguineous drainage noted. The wound margin is distinct with the outline attached to the wound base. There is large (67-100%) red granulation within the wound bed. There is a small (1-33%) amount of necrotic tissue within the wound bed including Adherent Slough. Wound #3 status is Open. Original cause of wound was Gradually Appeared. The date acquired was: 01/27/2021. The wound is located on the Left,Posterior Lower Leg. The wound measures 0.7cm length x 0.6cm width x 0.1cm depth; 0.33cm^2 area and 0.033cm^3 volume. There is Fat Layer  (Subcutaneous Tissue) exposed. There is no tunneling or undermining noted. There is a medium amount of serous drainage noted. The wound margin is distinct with the outline attached to the wound base. There is medium (34-66%) pink, pale granulation within the wound bed. There is a small (1-33%) amount of necrotic tissue within the wound bed including Adherent Slough. Assessment Active Problems ICD-10 Non-pressure chronic ulcer of other part of left lower leg with unspecified severity Lymphedema, not elsewhere classified Venous insufficiency (chronic) (peripheral) Type 2 diabetes mellitus without complications Patient presents with a nonhealing wound. She was evaluated by vein and vascular and reports that she does have adequate blood flow for wound healing. Wound issues are likely related to her lymphedema. At this time they are ordering her pumps. I will go ahead and get a juxta light ordered for her left leg. She will need this after treatment. I recommended we use compression therapy with Hydrofera Blue and Santyl. She is concerned of the  amount of tightness that the wrap will make. I told her we can start with the lowest level and build up. Plan Follow-up Appointments: Return Appointment in 1 week. - Dr. Heber Tat [place] Discharge From [place]: Discharge from [place] Bathing/ Shower/ Hygiene: May shower with protection but do not get wound dressing(s) wet. Edema Control - Lymphedema / SCD / Other: Lymphedema Pumps. Use Lymphedema pumps on leg(s) 2-3 times a day for 45-60 minutes. If wearing any wraps or hose, do not remove them. Continue exercising as instructed. - [facility] has ordered pumps for patient. Elevate legs to the level of the heart or above for 30 minutes daily and/or when sitting, a frequency of: Avoid standing for long periods of time. Exercise regularly Compression stocking or Garment 20-30 mm/Hg pressure to: - Wear Juxtalite to right leg, will order one for left  leg. Additional Orders / Instructions: Follow Nutritious Diet WOUND #2: - Lower Leg Wound Laterality: Left, Anterior Cleanser: Soap and Water 1 x Per Week/15 Days Discharge Instructions: May shower and wash wound with dial antibacterial soap and water prior to dressing change. Cleanser: Wound Cleanser 1 x Per Week/15 Days Discharge Instructions: Cleanse the wound with wound cleanser prior to applying a clean dressing using gauze sponges, not tissue or cotton balls. Peri-Wound Care: Triamcinolone 15 (g) 1 x Per Week/15 Days Discharge Instructions: Use triamcinolone 15 (g) as directed Peri-Wound Care: Sween Lotion (Moisturizing lotion) 1 x Per Week/15 Days Discharge Instructions: Apply moisturizing lotion as directed Prim Dressing: Hydrofera Blue Ready Foam, 4x5 in 1 x Per Week/15 Days ary Discharge Instructions: Apply to wound bed as instructed Prim Dressing: Santyl Ointment 1 x Per Week/15 Days ary Discharge Instructions: Apply nickel thick amount to wound bed as instructed Secondary Dressing: Woven Gauze Sponge, Non-Sterile 4x4 in 1 x Per Week/15 Days Discharge Instructions: Apply over primary dressing as directed. Secondary Dressing: ABD Pad, 5x9 1 x Per Week/15 Days Discharge Instructions: Apply over primary dressing as directed. Com pression Wrap: Kerlix Roll 4.5x3.1 (in/yd) 1 x Per Week/15 Days Discharge Instructions: Apply Kerlix and Coban compression as directed. Com pression Wrap: Coban Self-Adherent Wrap 4x5 (in/yd) 1 x Per Week/15 Days Discharge Instructions: Apply over Kerlix as directed. WOUND #3: - Lower Leg Wound Laterality: Left, Posterior Cleanser: Soap and Water 1 x Per Week/15 Days Discharge Instructions: May shower and wash wound with dial antibacterial soap and water prior to dressing change. Cleanser: Wound Cleanser 1 x Per Week/15 Days Discharge Instructions: Cleanse the wound with wound cleanser prior to applying a clean dressing using gauze sponges, not tissue or  cotton balls. Peri-Wound Care: Triamcinolone 15 (g) 1 x Per Week/15 Days Discharge Instructions: Use triamcinolone 15 (g) as directed Peri-Wound Care: Sween Lotion (Moisturizing lotion) 1 x Per Week/15 Days Discharge Instructions: Apply moisturizing lotion as directed Prim Dressing: Hydrofera Blue Ready Foam, 4x5 in 1 x Per Week/15 Days ary Discharge Instructions: Apply to wound bed as instructed Prim Dressing: Santyl Ointment 1 x Per Week/15 Days ary Discharge Instructions: Apply nickel thick amount to wound bed as instructed Secondary Dressing: Woven Gauze Sponge, Non-Sterile 4x4 in 1 x Per Week/15 Days Discharge Instructions: Apply over primary dressing as directed. Secondary Dressing: ABD Pad, 5x9 1 x Per Week/15 Days Discharge Instructions: Apply over primary dressing as directed. Com pression Wrap: Kerlix Roll 4.5x3.1 (in/yd) 1 x Per Week/15 Days Discharge Instructions: Apply Kerlix and Coban compression as directed. Com pression Wrap: Coban Self-Adherent Wrap 4x5 (in/yd) 1 x Per Week/15 Days Discharge Instructions: Apply  over Kerlix as directed. Com pression Stockings: Circaid Juxta Lite Compression Wrap Compression Amount: 20-30 mmHg (left) Discharge Instructions: Apply Circaid Juxta Lite Compression Wrap daily as instructed. Apply first thing in the morning, remove at night before bed. 1. Hydrofera Blue, Santyl under Kerlix/Coban 2. Order juxta lite for the left lower extremity 3. Follow-up in 1 week Electronic Signature(s) Signed: 02/11/2021 1:44:29 PM By: Kalman Shan DO Entered By: Kalman Shan on 02/11/2021 13:43:42 -------------------------------------------------------------------------------- HxROS Details Patient Name: Date of Service: Kayla [name], Kayla A. 02/10/2021 1:15 PM Medical Record Number: [id number] Patient Account Number: [id number] Date of Birth/Sex: Treating RN: 06-15-1941 (80 y.o. Kayla [name] Primary Care Provider: Jill Alexanders Other  Clinician: Referring Provider: Treating Provider/Extender: [name] in Treatment: 0 Information Obtained From Patient Eyes Medical History: Negative for: Cataracts; Glaucoma; Optic Neuritis Ear/Nose/Mouth/Throat Medical History: Negative for: Chronic sinus problems/congestion; Middle ear problems Hematologic/Lymphatic Medical History: Positive for: Lymphedema Negative for: Anemia; Hemophilia; Human Immunodeficiency Virus; Sickle Cell Disease Respiratory Medical History: Negative for: Aspiration; Asthma; Chronic Obstructive Pulmonary Disease (COPD); Pneumothorax; Sleep Apnea; Tuberculosis Cardiovascular Medical History: Positive for: Hypertension Negative for: Angina; Arrhythmia; Congestive Heart Failure; Coronary Artery Disease; Deep Vein Thrombosis; Hypotension; Myocardial Infarction; Peripheral Arterial Disease; Peripheral Venous Disease; Phlebitis; Vasculitis Past Medical History Notes: hyperlipidemia Gastrointestinal Medical History: Negative for: Cirrhosis ; Colitis; Crohns; Hepatitis A; Hepatitis B; Hepatitis C Past Medical History Notes: GERD Endocrine Medical History: Positive for: Type II Diabetes Negative for: Type I Diabetes Genitourinary Medical History: Negative for: End Stage Renal Disease Immunological Medical History: Negative for: Lupus Erythematosus; Raynauds; Scleroderma Integumentary (Skin) Medical History: Negative for: History of Burn Musculoskeletal Medical History: Positive for: Rheumatoid Arthritis; Osteoarthritis Negative for: Gout; Osteomyelitis Neurologic Medical History: Positive for: Neuropathy Negative for: Dementia; Quadriplegia; Paraplegia; Seizure Disorder Oncologic Medical History: Negative for: Received Chemotherapy; Received Radiation Immunizations Pneumococcal Vaccine: Received Pneumococcal Vaccination: Yes Implantable Devices None Family and Social History Unknown History: Yes; Never  smoker; Marital Status - Widowed; Alcohol Use: Never; Drug Use: No History; Caffeine Use: Moderate; Financial Concerns: No; Food, Clothing or Shelter Needs: No; Support System Lacking: No; Transportation Concerns: No Electronic Signature(s) Signed: 02/10/2021 6:01:46 PM By: Lorrin Jackson Signed: 02/11/2021 1:44:29 PM By: Kalman Shan DO Entered By: Lorrin Jackson on 02/10/2021 13:40:57 -------------------------------------------------------------------------------- [place] Details Patient Name: Date of Service: Kayla [name], Kayla A. 02/10/2021 Medical Record Number: [id number] Patient Account Number: [id number] Date of Birth/Sex: Treating RN: 01-03-1941 (80 y.o. Kayla [name] Primary Care Provider: Jill Alexanders Other Clinician: Referring Provider: Treating Provider/Extender: [name] in Treatment: 0 Diagnosis Coding ICD-10 Codes Code Description [phone] Non-pressure chronic ulcer of other part of left lower leg with unspecified severity I89.0 Lymphedema, not elsewhere classified I87.2 Venous insufficiency (chronic) (peripheral) E11.9 Type 2 diabetes mellitus without complications Facility Procedures CPT4 Code: [id number] Description: 9214 - WOUND CARE VISIT-LEV 4 EST PT Modifier: 25 Quantity: 1 Physician Procedures : CPT4 Code Description Modifier [id number] - WC PHYS LEVEL 3 - EST PT ICD-10 Diagnosis Description 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

## 2021-02-14 NOTE — Progress Notes (Signed)
Kayla, [name] ([id number]) Visit Report for 02/10/2021 Allergy List Details Patient Name: Date of Service: Kayla [name], [name] 02/10/2021 1:15 PM Medical Record Number: [id number] Patient Account Number: [id number] Date of Birth/Sex: Treating RN: [birth year] (80 y.o. Kayla [name] Primary Care Braheem Tomasik: Jill Alexanders Other Clinician: Referring Theta Leaf: Treating Adya Wirz/Extender: [name] in Treatment: 0 Allergies Active Allergies No Known Allergies Allergy Notes Electronic Signature(s) Signed: 02/10/2021 6:01:46 PM By: Lorrin Jackson Entered By: Lorrin Jackson on 02/10/2021 13:40:32 -------------------------------------------------------------------------------- Arrival Information Details Patient Name: Date of Service: Kayla [name], Kayla A. 02/10/2021 1:15 PM Medical Record Number: [id number] Patient Account Number: [id number] Date of Birth/Sex: Treating RN: 1940/10/21 (80 y.o. Tonita [name], Kayla [name] Primary Care [name]: Jill Alexanders Other Clinician: Referring [name]: Treating Rodrigues Urbanek/Extender: [name] in Treatment: 0 Visit Information Patient Arrived: Wheel Chair Arrival Time: 13:22 Accompanied By: self Transfer Assistance: None Patient Identification Verified: Yes Secondary Verification Process Completed: Yes Patient Requires Transmission-Based Precautions: No Patient Has Alerts: Yes Patient Alerts: Patient on Blood Thinner VVS ABI 6/27 R=0.94 VVS ABI 6/27 L=[place] History Since Last Visit Added or deleted any medications: No Any new allergies or adverse reactions: No Had a fall or experienced change in activities of daily living that may affect risk of falls: No Signs or symptoms of abuse/neglect since last visito No Hospitalized since last visit: No Implantable device outside of the clinic excluding cellular tissue based products placed in the center since last visit: No Has Dressing in Place as Prescribed:  Yes Electronic Signature(s) Signed: 02/10/2021 6:01:46 PM By: Lorrin Jackson Entered By: Lorrin Jackson on 02/10/2021 13:53:33 -------------------------------------------------------------------------------- Clinic Level of Care Assessment Details Patient Name: Date of Service: [facility] [name], [facility]. 02/10/2021 1:15 PM Medical Record Number: [id number] Patient Account Number: [id number] Date of Birth/Sex: Treating RN: Oct 07, 1940 (80 y.o. Kayla [name] Primary Care Kayla [name]: Jill Alexanders Other Clinician: Referring Kayla [name]: Treating Kayla [name]/Extender: [name] in Treatment: 0 Clinic Level of Care Assessment Items TOOL 2 Quantity Score X- 1 0 Use when only an EandM is performed on the INITIAL visit ASSESSMENTS - Nursing Assessment / Reassessment X- 1 20 General Physical Exam (combine w/ comprehensive assessment (listed just below) when performed on new pt. evals) X- 1 25 Comprehensive Assessment (HX, ROS, Risk Assessments, Wounds Hx, etc.) ASSESSMENTS - Wound and Skin A ssessment / Reassessment []  - 0 Simple Wound Assessment / Reassessment - one wound X- 2 5 Complex Wound Assessment / Reassessment - multiple wounds []  - 0 Dermatologic / Skin Assessment (not related to wound area) ASSESSMENTS - Ostomy and/or Continence Assessment and Care []  - 0 Incontinence Assessment and Management []  - 0 Ostomy Care Assessment and Management (repouching, etc.) PROCESS - Coordination of Care []  - 0 Simple Patient / Family Education for ongoing care X- 1 20 Complex (extensive) Patient / Family Education for ongoing care X- 1 10 Staff obtains Programmer, systems, Records, T Results / Process Orders est []  - 0 Staff telephones HHA, Nursing Homes / Clarify orders / etc []  - 0 Routine Transfer to another Facility (non-emergent condition) []  - 0 Routine Hospital Admission (non-emergent condition) []  - 0 New Admissions / Biomedical engineer / Ordering NPWT Apligraf,  etc. , []  - 0 Emergency Hospital Admission (emergent condition) []  - 0 Simple Discharge Coordination []  - 0 Complex (extensive) Discharge Coordination PROCESS - Special Needs []  - 0 Pediatric / Minor Patient Management []  - 0 Isolation Patient Management []  - 0 Hearing / Language / Visual special needs []  -  0 Assessment of Community assistance (transportation, D/C planning, etc.) []  - 0 Additional assistance / Altered mentation []  - 0 Support Surface(s) Assessment (bed, cushion, seat, etc.) INTERVENTIONS - Wound Cleansing / Measurement X- 1 5 Wound Imaging (photographs - any number of wounds) []  - 0 Wound Tracing (instead of photographs) []  - 0 Simple Wound Measurement - one wound X- 2 5 Complex Wound Measurement - multiple wounds []  - 0 Simple Wound Cleansing - one wound X- 2 5 Complex Wound Cleansing - multiple wounds INTERVENTIONS - Wound Dressings []  - 0 Small Wound Dressing one or multiple wounds []  - 0 Medium Wound Dressing one or multiple wounds X- 1 20 Large Wound Dressing one or multiple wounds []  - 0 Application of Medications - injection INTERVENTIONS - Miscellaneous []  - 0 External ear exam []  - 0 Specimen Collection (cultures, biopsies, blood, body fluids, etc.) []  - 0 Specimen(s) / Culture(s) sent or taken to Lab for analysis []  - 0 Patient Transfer (multiple staff / Civil Service fast streamer / Similar devices) []  - 0 Simple Staple / Suture removal (25 or less) []  - 0 Complex Staple / Suture removal (26 or more) []  - 0 Hypo / Hyperglycemic Management (close monitor of Blood Glucose) []  - 0 Ankle / Brachial Index (ABI) - do not check if billed separately Has the patient been seen at the hospital within the last three years: Yes Total Score: 130 Level Of Care: New/Established - Level 4 Electronic Signature(s) Signed: 02/10/2021 6:01:46 PM By: Lorrin Jackson Entered By: Lorrin Jackson on 02/10/2021  14:30:15 -------------------------------------------------------------------------------- Encounter Discharge Information Details Patient Name: Date of Service: [facility] [name], [name] 02/10/2021 1:15 PM Medical Record Number: [id number] Patient Account Number: [id number] Date of Birth/Sex: Treating RN: August 01, 1940 (80 y.o. Kayla [name] Primary Care Mattye Verdone: Jill Alexanders Other Clinician: Referring Clinton Wahlberg: Treating Janmarie Smoot/Extender: [name] in Treatment: 0 Encounter Discharge Information Items Discharge Condition: Stable Ambulatory Status: Wheelchair Discharge Destination: Home Transportation: Private Auto Schedule Follow-up Appointment: Yes Clinical Summary of Care: Provided on 02/10/2021 Form Type Recipient Paper Patient Patient Electronic Signature(s) Signed: 02/10/2021 4:48:43 PM By: Lorrin Jackson Entered By: Lorrin Jackson on 02/10/2021 16:48:43 -------------------------------------------------------------------------------- Lower Extremity Assessment Details Patient Name: Date of Service: Kayla [name], Kayla A. 02/10/2021 1:15 PM Medical Record Number: [id number] Patient Account Number: [id number] Date of Birth/Sex: Treating RN: 05-10-1941 (80 y.o. Kayla [name] Primary Care Kenise Barraco: Jill Alexanders Other Clinician: Referring [place] Sloop: Treating Garner Dullea/Extender: [name] in Treatment: 0 Edema Assessment Assessed: Shirlyn Goltz: Yes] Patrice [name]: No] Edema: [Left: Ye] [Right: s] Calf Left: Right: Point of Measurement: 31 cm From Medial Instep 47 cm Ankle Left: Right: Point of Measurement: 8 cm From Medial Instep 30 cm Knee To Floor Left: Right: From Medial Instep 40 cm Vascular Assessment Pulses: Dorsalis Pedis Palpable: [Left:Yes] Electronic Signature(s) Signed: 02/10/2021 6:01:46 PM By: Lorrin Jackson Entered By: Lorrin Jackson on 02/10/2021  14:31:04 -------------------------------------------------------------------------------- Multi Wound Chart Details Patient Name: Date of Service: [facility] [name], Cicely A. 02/10/2021 1:15 PM Medical Record Number: [id number] Patient Account Number: [id number] Date of Birth/Sex: Treating RN: Jul 11, 1941 (80 y.o. Tonita [name], Kayla [name] Primary Care Haygen Zebrowski: Jill Alexanders Other Clinician: Referring Itsel Opfer: Treating Leoncio Hansen/Extender: [name] in Treatment: 0 Vital Signs Height(in): Pulse(bpm): 58 Weight(lbs): Blood Pressure(mmHg): 156/72 Body Mass Index(BMI): Temperature(F): 98.2 Respiratory Rate(breaths/min): 18 Photos: [N/A:N/A] Left, Anterior Lower Leg Left, Posterior Lower Leg N/A Wound Location: Gradually Appeared Gradually Appeared N/A Wounding Event: Venous Leg Ulcer Venous Leg Ulcer N/A Primary Etiology: Lymphedema, Hypertension,  Type II Lymphedema, Hypertension, Type II N/A Comorbid History: Diabetes, Rheumatoid Arthritis, Diabetes, Rheumatoid Arthritis, Osteoarthritis, Neuropathy Osteoarthritis, Neuropathy 01/27/2021 01/27/2021 N/A Date Acquired: 0 0 N/A Weeks of Treatment: Open Open N/A Wound Status: 3.2x3.5x0.1 0.7x0.6x0.1 N/A Measurements L x W x D (cm) 8.796 0.33 N/A A (cm) : rea 0.88 0.033 N/A Volume (cm) : Full Thickness Without Exposed Full Thickness Without Exposed N/A Classification: Support Structures Support Structures Medium Medium N/A Exudate Amount: Serosanguineous Serous N/A Exudate Type: red, brown amber N/A Exudate Color: Distinct, outline attached Distinct, outline attached N/A Wound Margin: Large (67-100%) Medium (34-66%) N/A Granulation Amount: Red Pink, Pale N/A Granulation Quality: Small (1-33%) Small (1-33%) N/A Necrotic Amount: Fat Layer (Subcutaneous Tissue): Yes Fat Layer (Subcutaneous Tissue): Yes N/A Exposed Structures: Fascia: No Fascia: No Tendon: No Tendon: No Muscle: No Muscle:  No Joint: No Joint: No Bone: No Bone: No None None N/A Epithelialization: Treatment Notes Wound #2 (Lower Leg) Wound Laterality: Left, Anterior Cleanser Soap and Water Discharge Instruction: May shower and wash wound with dial antibacterial soap and water prior to dressing change. Wound Cleanser Discharge Instruction: Cleanse the wound with wound cleanser prior to applying a clean dressing using gauze sponges, not tissue or cotton balls. Peri-Wound Care Triamcinolone 15 (g) Discharge Instruction: Use triamcinolone 15 (g) as directed Sween Lotion (Moisturizing lotion) Discharge Instruction: Apply moisturizing lotion as directed Topical Primary Dressing Hydrofera Blue Ready Foam, 4x5 in Discharge Instruction: Apply to wound bed as instructed Santyl Ointment Discharge Instruction: Apply nickel thick amount to wound bed as instructed Secondary Dressing Woven Gauze Sponge, Non-Sterile 4x4 in Discharge Instruction: Apply over primary dressing as directed. ABD Pad, 5x9 Discharge Instruction: Apply over primary dressing as directed. Secured With Compression Wrap Kerlix Roll 4.5x3.1 (in/yd) Discharge Instruction: Apply Kerlix and Coban compression as directed. Coban Self-Adherent Wrap 4x5 (in/yd) Discharge Instruction: Apply over Kerlix as directed. Compression Stockings Add-Ons Wound #3 (Lower Leg) Wound Laterality: Left, Posterior Cleanser Soap and Water Discharge Instruction: May shower and wash wound with dial antibacterial soap and water prior to dressing change. Wound Cleanser Discharge Instruction: Cleanse the wound with wound cleanser prior to applying a clean dressing using gauze sponges, not tissue or cotton balls. Peri-Wound Care Triamcinolone 15 (g) Discharge Instruction: Use triamcinolone 15 (g) as directed Sween Lotion (Moisturizing lotion) Discharge Instruction: Apply moisturizing lotion as directed Topical Primary Dressing Hydrofera Blue Ready Foam, 4x5  in Discharge Instruction: Apply to wound bed as instructed Santyl Ointment Discharge Instruction: Apply nickel thick amount to wound bed as instructed Secondary Dressing Woven Gauze Sponge, Non-Sterile 4x4 in Discharge Instruction: Apply over primary dressing as directed. ABD Pad, 5x9 Discharge Instruction: Apply over primary dressing as directed. Secured With Compression Wrap Kerlix Roll 4.5x3.1 (in/yd) Discharge Instruction: Apply Kerlix and Coban compression as directed. Coban Self-Adherent Wrap 4x5 (in/yd) Discharge Instruction: Apply over Kerlix as directed. Compression Stockings Add-Ons Electronic Signature(s) Signed: 02/11/2021 1:44:29 PM By: Kalman Shan DO Signed: 02/14/2021 5:08:20 PM By: Rhae Hammock RN Previous Signature: 02/10/2021 6:01:49 PM Version By: Rhae Hammock RN Entered By: Kalman Shan on 02/11/2021 13:33:54 -------------------------------------------------------------------------------- Multi-Disciplinary Care Plan Details Patient Name: Date of Service: [facility] [name], [facility] A. 02/10/2021 1:15 PM Medical Record Number: [id number] Patient Account Number: [id number] Date of Birth/Sex: Treating RN: January 04, 1941 (80 y.o. Kayla [name] Primary Care Demontray Franta: Jill Alexanders Other Clinician: Referring Etrulia Zarr: Treating Elvin Banker/Extender: [name] in Treatment: 0 Active Inactive Venous Leg Ulcer Nursing Diagnoses: Actual venous Insuffiency (use after diagnosis is confirmed) Goals: Patient will maintain optimal  edema control Date Initiated: 02/10/2021 Target Resolution Date: 03/10/2021 Goal Status: Active Patient/caregiver will verbalize understanding of disease process and disease management Date Initiated: 02/10/2021 Target Resolution Date: 03/10/2021 Goal Status: Active Interventions: Assess peripheral edema status every visit. Compression as ordered Treatment Activities: Therapeutic compression applied :  02/10/2021 Notes: Wound/Skin Impairment Nursing Diagnoses: Impaired tissue integrity Goals: Ulcer/skin breakdown will have a volume reduction of 30% by week 4 Date Initiated: 02/10/2021 Target Resolution Date: 03/17/2021 Goal Status: Active Interventions: Assess patient/caregiver ability to obtain necessary supplies Assess patient/caregiver ability to perform ulcer/skin care regimen upon admission and as needed Assess ulceration(s) every visit Provide education on ulcer and skin care Treatment Activities: Skin care regimen initiated : 02/10/2021 Topical wound management initiated : 02/10/2021 Notes: Electronic Signature(s) Signed: 02/10/2021 6:01:46 PM By: Lorrin Jackson Entered By: Lorrin Jackson on 02/10/2021 14:22:25 -------------------------------------------------------------------------------- Pain Assessment Details Patient Name: Date of Service: Kayla [name], Kayla A. 02/10/2021 1:15 PM Medical Record Number: [id number] Patient Account Number: [id number] Date of Birth/Sex: Treating RN: 1941-07-19 (80 y.o. Kayla [name] Primary Care Timber Lucarelli: Jill Alexanders Other Clinician: Referring Brion Sossamon: Treating Deandrea Vanpelt/Extender: [name] in Treatment: 0 Active Problems Location of Pain Severity and Description of Pain Patient Has Paino No Site Locations Pain Management and Medication Current Pain Management: Electronic Signature(s) Signed: 02/10/2021 6:01:46 PM By: Lorrin Jackson Entered By: Lorrin Jackson on 02/10/2021 13:49:17 -------------------------------------------------------------------------------- Patient/Caregiver Education Details Patient Name: Date of Service: Kayla [name], Milas Gain 7/14/2022andnbsp1:15 PM Medical Record Number: [id number] Patient Account Number: [id number] Date of Birth/Gender: Treating RN: [birth year] (80 y.o. Kayla [name] Primary Care Physician: Jill Alexanders Other Clinician: Referring Physician: Treating  Physician/Extender: [name] in Treatment: 0 Education Assessment Education Provided To: Patient Education Topics Provided Venous: Methods: Explain/Verbal, Printed Responses: State content correctly Wound/Skin Impairment: Methods: Explain/Verbal, Printed Responses: State content correctly Electronic Signature(s) Signed: 02/10/2021 6:01:46 PM By: Lorrin Jackson Entered By: Lorrin Jackson on 02/10/2021 14:22:49 -------------------------------------------------------------------------------- Wound Assessment Details Patient Name: Date of Service: Kayla [name], Kayla A. 02/10/2021 1:15 PM Medical Record Number: [id number] Patient Account Number: [id number] Date of Birth/Sex: Treating RN: January 23, 1941 (80 y.o. Kayla [name] Primary Care Pervis Macintyre: Jill Alexanders Other Clinician: Referring Shawndra Clute: Treating Wilmot Quevedo/Extender: [name] in Treatment: 0 Wound Status Wound Number: 2 Primary Venous Leg Ulcer Etiology: Wound Location: Left, Anterior Lower Leg Wound Open Wounding Event: Gradually Appeared Status: Date Acquired: 01/27/2021 Comorbid Lymphedema, Hypertension, Type II Diabetes, Rheumatoid Weeks Of Treatment: 0 History: Arthritis, Osteoarthritis, Neuropathy Clustered Wound: No Photos Photo Uploaded By: Sandre Kitty on 02/11/2021 10:12:08 Wound Measurements Length: (cm) 3.2 Width: (cm) 3.5 Depth: (cm) 0.1 Area: (cm) 8.796 Volume: (cm) 0.88 % Reduction in Area: % Reduction in Volume: Epithelialization: None Tunneling: No Undermining: No Wound Description Classification: Full Thickness Without Exposed Support Structures Wound Margin: Distinct, outline attached Exudate Amount: Medium Exudate Type: Serosanguineous Exudate Color: red, brown Foul Odor After Cleansing: No Slough/Fibrino Yes Wound Bed Granulation Amount: Large (67-100%) Exposed Structure Granulation Quality: Red Fascia Exposed: No Necrotic  Amount: Small (1-33%) Fat Layer (Subcutaneous Tissue) Exposed: Yes Necrotic Quality: Adherent Slough Tendon Exposed: No Muscle Exposed: No Joint Exposed: No Bone Exposed: No Treatment Notes Wound #2 (Lower Leg) Wound Laterality: Left, Anterior Cleanser Soap and Water Discharge Instruction: May shower and wash wound with dial antibacterial soap and water prior to dressing change. Wound Cleanser Discharge Instruction: Cleanse the wound with wound cleanser prior to applying a clean dressing using gauze sponges, not tissue or cotton balls. Peri-Wound Care  Triamcinolone 15 (g) Discharge Instruction: Use triamcinolone 15 (g) as directed Sween Lotion (Moisturizing lotion) Discharge Instruction: Apply moisturizing lotion as directed Topical Primary Dressing Hydrofera Blue Ready Foam, 4x5 in Discharge Instruction: Apply to wound bed as instructed Santyl Ointment Discharge Instruction: Apply nickel thick amount to wound bed as instructed Secondary Dressing Woven Gauze Sponge, Non-Sterile 4x4 in Discharge Instruction: Apply over primary dressing as directed. ABD Pad, 5x9 Discharge Instruction: Apply over primary dressing as directed. Secured With Compression Wrap Kerlix Roll 4.5x3.1 (in/yd) Discharge Instruction: Apply Kerlix and Coban compression as directed. Coban Self-Adherent Wrap 4x5 (in/yd) Discharge Instruction: Apply over Kerlix as directed. Compression Stockings Add-Ons Electronic Signature(s) Signed: 02/10/2021 6:01:46 PM By: Lorrin Jackson Entered By: Lorrin Jackson on 02/10/2021 13:47:34 -------------------------------------------------------------------------------- Wound Assessment Details Patient Name: Date of Service: Kayla [name], Kayla A. 02/10/2021 1:15 PM Medical Record Number: [id number] Patient Account Number: [id number] Date of Birth/Sex: Treating RN: 05/08/1941 (80 y.o. Kayla [name] Primary Care Harly Pipkins: Jill Alexanders Other Clinician: Referring  Arian Murley: Treating Darcel Frane/Extender: [name] in Treatment: 0 Wound Status Wound Number: 3 Primary Venous Leg Ulcer Etiology: Wound Location: Left, Posterior Lower Leg Wound Open Wounding Event: Gradually Appeared Status: Date Acquired: 01/27/2021 Comorbid Lymphedema, Hypertension, Type II Diabetes, Rheumatoid Weeks Of Treatment: 0 History: Arthritis, Osteoarthritis, Neuropathy Clustered Wound: No Photos Photo Uploaded By: Sandre Kitty on 02/11/2021 10:12:08 Wound Measurements Length: (cm) 0.7 Width: (cm) 0.6 Depth: (cm) 0.1 Area: (cm) 0.33 Volume: (cm) 0.033 % Reduction in Area: % Reduction in Volume: Epithelialization: None Tunneling: No Undermining: No Wound Description Classification: Full Thickness Without Exposed Support Structures Wound Margin: Distinct, outline attached Exudate Amount: Medium Exudate Type: Serous Exudate Color: amber Foul Odor After Cleansing: No Slough/Fibrino Yes Wound Bed Granulation Amount: Medium (34-66%) Exposed Structure Granulation Quality: Pink, Pale Fascia Exposed: No Necrotic Amount: Small (1-33%) Fat Layer (Subcutaneous Tissue) Exposed: Yes Necrotic Quality: Adherent Slough Tendon Exposed: No Muscle Exposed: No Joint Exposed: No Bone Exposed: No Treatment Notes Wound #3 (Lower Leg) Wound Laterality: Left, Posterior Cleanser Soap and Water Discharge Instruction: May shower and wash wound with dial antibacterial soap and water prior to dressing change. Wound Cleanser Discharge Instruction: Cleanse the wound with wound cleanser prior to applying a clean dressing using gauze sponges, not tissue or cotton balls. Peri-Wound Care Triamcinolone 15 (g) Discharge Instruction: Use triamcinolone 15 (g) as directed Sween Lotion (Moisturizing lotion) Discharge Instruction: Apply moisturizing lotion as directed Topical Primary Dressing Hydrofera Blue Ready Foam, 4x5 in Discharge Instruction: Apply  to wound bed as instructed Santyl Ointment Discharge Instruction: Apply nickel thick amount to wound bed as instructed Secondary Dressing Woven Gauze Sponge, Non-Sterile 4x4 in Discharge Instruction: Apply over primary dressing as directed. ABD Pad, 5x9 Discharge Instruction: Apply over primary dressing as directed. Secured With Compression Wrap Kerlix Roll 4.5x3.1 (in/yd) Discharge Instruction: Apply Kerlix and Coban compression as directed. Coban Self-Adherent Wrap 4x5 (in/yd) Discharge Instruction: Apply over Kerlix as directed. Compression Stockings Add-Ons Electronic Signature(s) Signed: 02/10/2021 6:01:46 PM By: Lorrin Jackson Entered By: Lorrin Jackson on 02/10/2021 13:49:04 -------------------------------------------------------------------------------- Vitals Details Patient Name: Date of Service: Kayla [name], Kayla A. 02/10/2021 1:15 PM Medical Record Number: [id number] Patient Account Number: [id number] Date of Birth/Sex: Treating RN: 05-30-1941 (80 y.o. Tonita [name], Kayla [name] Primary Care Keisy Strickler: Jill Alexanders Other Clinician: Referring Mance Vallejo: Treating Swayze Kozuch/Extender: [name] in Treatment: 0 Vital Signs Time Taken: 13:22 Temperature (F): 98.2 Pulse (bpm): 71 Respiratory Rate (breaths/min): 18 Blood Pressure (mmHg): 156/72 Reference Range: 80 - 120 mg /  dl Electronic Signature(s) Signed: 02/14/2021 3:49:49 PM By: Sandre Kitty Signed: 02/14/2021 3:49:49 PM By: Sandre Kitty Entered By: Sandre Kitty on 02/10/2021 13:22:50

## 2021-02-15 ENCOUNTER — Telehealth: Payer: Self-pay

## 2021-02-15 ENCOUNTER — Telehealth: Payer: Self-pay | Admitting: Family Medicine

## 2021-02-15 DIAGNOSIS — I890 Lymphedema, not elsewhere classified: Secondary | ICD-10-CM | POA: Diagnosis not present

## 2021-02-15 NOTE — Telephone Encounter (Signed)
Pt's son [name] called with questions regarding getting home health set up to help pt with lymphedema pump. Additional info requested from insurance company has been faxed this morning. The nurse who coordinates home health has been made aware and will reach out to [place] once she returns this week. [name] is aware of the above.

## 2021-02-15 NOTE — Telephone Encounter (Signed)
Please call, follow up from conversation yesterday

## 2021-02-16 NOTE — Telephone Encounter (Signed)
Called son back to advise that I have not heard back from [facility] . He thanked me for returning his call and advise he would continue to search on his end as well for home health to assists pt with pump from [employer]. [place]

## 2021-02-17 ENCOUNTER — Other Ambulatory Visit: Payer: Self-pay

## 2021-02-17 ENCOUNTER — Encounter (HOSPITAL_BASED_OUTPATIENT_CLINIC_OR_DEPARTMENT_OTHER): Payer: Medicare PPO | Admitting: Internal Medicine

## 2021-02-17 DIAGNOSIS — E119 Type 2 diabetes mellitus without complications: Secondary | ICD-10-CM

## 2021-02-17 DIAGNOSIS — L97822 Non-pressure chronic ulcer of other part of left lower leg with fat layer exposed: Secondary | ICD-10-CM | POA: Diagnosis not present

## 2021-02-17 DIAGNOSIS — L97829 Non-pressure chronic ulcer of other part of left lower leg with unspecified severity: Secondary | ICD-10-CM | POA: Diagnosis not present

## 2021-02-17 DIAGNOSIS — I890 Lymphedema, not elsewhere classified: Secondary | ICD-10-CM

## 2021-02-17 DIAGNOSIS — I872 Venous insufficiency (chronic) (peripheral): Secondary | ICD-10-CM

## 2021-02-17 DIAGNOSIS — E11622 Type 2 diabetes mellitus with other skin ulcer: Secondary | ICD-10-CM | POA: Diagnosis not present

## 2021-02-17 NOTE — Progress Notes (Addendum)
DEIRDRA, [name] ([id number]) Visit Report for 02/17/2021 Chief Complaint Document Details Patient Name: Date of Service: Kayla [name], Kayla [name]. 02/17/2021 9:45 A M Medical Record Number: [id number] Patient Account Number: [id number] Date of Birth/Sex: Treating RN: [birth year] (80 y.o. Kayla [name], Kayla [name] Primary Care Provider: Jill [name] Other Clinician: Referring Provider: Treating Provider/Extender: Kayla [name] in Treatment: 1 Information Obtained from: Patient Chief Complaint left lower extremity wounds Electronic Signature(s) Signed: 02/17/2021 11:06:53 AM By: Kalman Shan DO Entered By: Kalman Shan on 02/17/2021 10:59:37 -------------------------------------------------------------------------------- HPI Details Patient Name: Date of Service: Kayla [name], [place] A. 02/17/2021 9:45 A M Medical Record Number: [id number] Patient Account Number: [id number] Date of Birth/Sex: Treating RN: Dec 08, 1940 (80 y.o. Kayla [name], Kayla [name] Primary Care Provider: Jill [name] Other Clinician: Referring Provider: Treating Provider/Extender: Kayla [name] in Treatment: 1 History of Present Illness HPI Description: Admission 5/20 Kayla [name] is an 80 year old female with a past medical history of type 2 diabetes on oral agents, chronic venous insufficiency and lymphedema that presents to our clinic for bilateral lower extremity swelling with a small open wound to the right lower extremity. She states that her swelling has increased over the past year. She does not use compression stockings although she does own some. She states these are too hard to get on. She tells me she does not have a history of open wounds however in the EMR it is noted that she has had several cases of open wounds to her legs. She reports pain to shin of her left lower extremity. She is currently on doxycycline for cellulitis of her calf. She states that the pain to her  shin has been present for the past 6 months and has not gotten better. There are no open wounds to the left lower extremity. 5/27; patient presents for 1 week follow-up. Her wound has healed on her right lower extremity. She has no open wounds to her left lower extremity. She had an ultrasound of an suspicious area last week on the left lower extremity that could possibly have been an abscess. She received her juxta light compression and has it today. She denies signs of infection. Admission 7/14 Kayla [name] presents for a left lower extremity wound That occurred 2 weeks ago. She was evaluated by vein and vascular and she states that they are ordering her lymphedema pumps. She is currently keeping the areas covered. She denies signs of infection. She reports tenderness to the area. 7/21; patient presents for 1 week follow-up. She had a Kerlix/Coban wrap placed at last clinic visit that she rolled down a fourth of the way due to itching. She no longer wants the wrap in place. She also declines debridement today. She would like home health to help with wound care dressings. She states she has lymphedema pumps coming in the mail however she has no way of putting these on her taking these off. Electronic Signature(s) Signed: 02/17/2021 11:06:53 AM By: Kalman Shan DO Entered By: Kalman Shan on 02/17/2021 11:03:06 -------------------------------------------------------------------------------- Physical Exam Details Patient Name: Date of Service: Kayla [name], [place] A. 02/17/2021 9:45 A M Medical Record Number: [id number] Patient Account Number: [id number] Date of Birth/Sex: Treating RN: Oct 14, 1940 (80 y.o. Kayla [name], Kayla [name] Primary Care Provider: Jill [name] Other Clinician: Referring Provider: Treating Provider/Extender: Kayla [name] in Treatment: 1 Constitutional respirations regular, non-labored and within target range for patient.. Cardiovascular 2+ dorsalis  pedis/posterior tibialis pulses. Psychiatric pleasant and cooperative. Notes Left lower extremity: 3+ pitting edema  to the knee. Open wounds to the anterior and posterior aspect with nonviable tissue and granulation tissue present. Lymphedema skin changes present. No signs of infection. Electronic Signature(s) Signed: 02/17/2021 11:06:53 AM By: Kalman Shan DO Entered By: Kalman Shan on 02/17/2021 11:03:40 -------------------------------------------------------------------------------- Physician Orders Details Patient Name: Date of Service: Kayla [name], [place] A. 02/17/2021 9:45 A M Medical Record Number: [id number] Patient Account Number: [id number] Date of Birth/Sex: Treating RN: 1941-07-31 (80 y.o. Kayla [name], Kayla [name] Primary Care Provider: Jill [name] Other Clinician: Referring Provider: Treating Provider/Extender: Kayla [name] in Treatment: 1 Verbal / Phone Orders: No Diagnosis Coding ICD-10 Coding Code Description [id number] Non-pressure chronic ulcer of other part of left lower leg with unspecified severity I89.0 Lymphedema, not elsewhere classified I87.2 Venous insufficiency (chronic) (peripheral) E11.9 Type 2 diabetes mellitus without complications Follow-up Appointments ppointment in 1 week. - Dr. Heber [place] Return A Bathing/ Shower/ Hygiene May shower with protection but do not get wound dressing(s) wet. Edema Control - Lymphedema / SCD / Other Lymphedema Pumps. Use Lymphedema pumps on leg(s) 2-3 times a day for 45-60 minutes. If wearing any wraps or hose, do not remove them. Continue exercising as instructed. - [facility] has ordered pumps for patient. Elevate legs to the level of the heart or above for 30 minutes daily and/or when sitting, a frequency of: Avoid standing for long periods of time. Exercise regularly Compression stocking or Garment 20-30 mm/Hg pressure to: - Wear Juxtalite to right leg, will order one for left leg. Additional Orders /  Instructions Follow [facility] to [place] for wound care. May utilize formulary equivalent dressing for wound treatment orders unless otherwise specified. - [facility] Home health to change 2-3 x a week Wound Treatment Wound #2 - Lower Leg Wound Laterality: Left, Anterior Cleanser: Soap and Water Every Other Day/15 Days Discharge Instructions: May shower and wash wound with dial antibacterial soap and water prior to dressing change. Cleanser: Wound Cleanser (DME) (Generic) Every Other Day/15 Days Discharge Instructions: Cleanse the wound with wound cleanser prior to applying a clean dressing using gauze sponges, not tissue or cotton balls. Peri-Wound Care: Triamcinolone 15 (g) Every Other Day/15 Days Discharge Instructions: Use triamcinolone 15 (g) as directed Peri-Wound Care: Sween Lotion (Moisturizing lotion) Every Other Day/15 Days Discharge Instructions: Apply moisturizing lotion as directed Prim Dressing: Hydrofera Blue Ready Foam, 4x5 in (DME) (Generic) Every Other Day/15 Days ary Discharge Instructions: Apply to wound bed as instructed Prim Dressing: Santyl Ointment Every Other Day/15 Days ary Discharge Instructions: Apply nickel thick amount to wound bed as instructed Secondary Dressing: Woven Gauze Sponge, Non-Sterile 4x4 in (DME) (Generic) Every Other Day/15 Days Discharge Instructions: Apply over primary dressing as directed. Secondary Dressing: ABD Pad, 5x9 (DME) (Generic) Every Other Day/15 Days Discharge Instructions: Apply over primary dressing as directed. Wound #3 - Lower Leg Wound Laterality: Left, Posterior Cleanser: Soap and Water 1 x Per Week/15 Days Discharge Instructions: May shower and wash wound with dial antibacterial soap and water prior to dressing change. Cleanser: Wound Cleanser (Generic) 1 x Per Week/15 Days Discharge Instructions: Cleanse the wound with wound cleanser prior to applying a clean dressing using gauze  sponges, not tissue or cotton balls. Peri-Wound Care: Triamcinolone 15 (g) 1 x Per Week/15 Days Discharge Instructions: Use triamcinolone 15 (g) as directed Peri-Wound Care: Sween Lotion (Moisturizing lotion) 1 x Per Week/15 Days Discharge Instructions: Apply moisturizing lotion as directed Compression Stockings: Circaid Juxta Lite Compression Wrap Left Leg Compression Amount: 20-30 mmHG  Discharge Instructions: Apply Circaid Juxta Lite Compression Wrap daily as instructed. Apply first thing in the morning, remove at night before bed. Electronic Signature(s) Signed: 02/21/2021 3:23:29 PM By: Rhae Hammock RN Signed: 02/21/2021 4:40:45 PM By: Kalman Shan DO Previous Signature: 02/17/2021 11:06:53 AM Version By: Kalman Shan DO Entered By: Rhae Hammock on 02/21/2021 15:22:01 -------------------------------------------------------------------------------- Problem List Details Patient Name: Date of Service: [facility] [name], Kayla A. 02/17/2021 9:45 A M Medical Record Number: [id number] Patient Account Number: [id number] Date of Birth/Sex: Treating RN: [birth year] (80 y.o. Kayla [name], Kayla [name] Primary Care Provider: Jill [name] Other Clinician: Referring Provider: Treating Provider/Extender: Kayla [name] in Treatment: 1 Active Problems ICD-10 Encounter Code Description Active Date MDM Diagnosis L97.829 Non-pressure chronic ulcer of other part of left lower leg with unspecified 02/10/2021 No Yes severity I89.0 Lymphedema, not elsewhere classified 02/10/2021 No Yes I87.2 Venous insufficiency (chronic) (peripheral) 02/10/2021 No Yes E11.9 Type 2 diabetes mellitus without complications 01/21/7627 No Yes Inactive Problems Resolved Problems Electronic Signature(s) Signed: 02/17/2021 11:06:53 AM By: Kalman Shan DO Entered By: Kalman Shan on 02/17/2021 10:59:16 -------------------------------------------------------------------------------- Progress  Note Details Patient Name: Date of Service: Kayla [name], Kayla A. 02/17/2021 9:45 A M Medical Record Number: [id number] Patient Account Number: [id number] Date of Birth/Sex: Treating RN: [birth year] (80 y.o. Kayla [name], Kayla [name] Primary Care Provider: Jill [name] Other Clinician: Referring Provider: Treating Provider/Extender: Kayla [name] in Treatment: 1 Subjective Chief Complaint Information obtained from Patient left lower extremity wounds History of Present Illness (HPI) Admission 5/20 Ms. Analea Muller is an 80 year old female with a past medical history of type 2 diabetes on oral agents, chronic venous insufficiency and lymphedema that presents to our clinic for bilateral lower extremity swelling with a small open wound to the right lower extremity. She states that her swelling has increased over the past year. She does not use compression stockings although she does own some. She states these are too hard to get on. She tells me she does not have a history of open wounds however in the EMR it is noted that she has had several cases of open wounds to her legs. She reports pain to shin of her left lower extremity. She is currently on doxycycline for cellulitis of her calf. She states that the pain to her shin has been present for the past 6 months and has not gotten better. There are no open wounds to the left lower extremity. 5/27; patient presents for 1 week follow-up. Her wound has healed on her right lower extremity. She has no open wounds to her left lower extremity. She had an ultrasound of an suspicious area last week on the left lower extremity that could possibly have been an abscess. She received her juxta light compression and has it today. She denies signs of infection. Admission 7/14 Kayla [name] presents for a left lower extremity wound That occurred 2 weeks ago. She was evaluated by vein and vascular and she states that they are ordering her lymphedema  pumps. She is currently keeping the areas covered. She denies signs of infection. She reports tenderness to the area. 7/21; patient presents for 1 week follow-up. She had a Kerlix/Coban wrap placed at last clinic visit that she rolled down a fourth of the way due to itching. She no longer wants the wrap in place. She also declines debridement today. She would like home health to help with wound care dressings. She states she has lymphedema pumps coming in the mail however she has no way  of putting these on her taking these off. Patient History Information obtained from Patient. Family History Unknown History. Social History Never smoker, Marital Status - Widowed, Alcohol Use - Never, Drug Use - No History, Caffeine Use - Moderate. Medical History Eyes Denies history of Cataracts, Glaucoma, Optic Neuritis Ear/Nose/Mouth/Throat Denies history of Chronic sinus problems/congestion, Middle ear problems Hematologic/Lymphatic Patient has history of Lymphedema Denies history of Anemia, Hemophilia, Human Immunodeficiency Virus, Sickle Cell Disease Respiratory Denies history of Aspiration, Asthma, Chronic Obstructive Pulmonary Disease (COPD), Pneumothorax, Sleep Apnea, Tuberculosis Cardiovascular Patient has history of Hypertension Denies history of Angina, Arrhythmia, Congestive Heart Failure, Coronary Artery Disease, Deep Vein Thrombosis, Hypotension, Myocardial Infarction, Peripheral Arterial Disease, Peripheral Venous Disease, Phlebitis, Vasculitis Gastrointestinal Denies history of Cirrhosis , Colitis, Crohnoos, Hepatitis A, Hepatitis B, Hepatitis C Endocrine Patient has history of Type II Diabetes Denies history of Type I Diabetes Genitourinary Denies history of End Stage Renal Disease Immunological Denies history of Lupus Erythematosus, Raynaudoos, Scleroderma Integumentary (Skin) Denies history of History of Burn Musculoskeletal Patient has history of Rheumatoid Arthritis,  Osteoarthritis Denies history of Gout, Osteomyelitis Neurologic Patient has history of Neuropathy Denies history of Dementia, Quadriplegia, Paraplegia, Seizure Disorder Oncologic Denies history of Received Chemotherapy, Received Radiation Medical A Surgical History Notes nd Cardiovascular hyperlipidemia Gastrointestinal GERD Objective Constitutional respirations regular, non-labored and within target range for patient.. Vitals Time Taken: 9:58 AM, Temperature: 98.3 F, Pulse: 94 bpm, Respiratory Rate: 18 breaths/min, Blood Pressure: 126/71 mmHg. Cardiovascular 2+ dorsalis pedis/posterior tibialis pulses. Psychiatric pleasant and cooperative. General Notes: Left lower extremity: 3+ pitting edema to the knee. Open wounds to the anterior and posterior aspect with nonviable tissue and granulation tissue present. Lymphedema skin changes present. No signs of infection. Integumentary (Hair, Skin) Wound #2 status is Open. Original cause of wound was Gradually Appeared. The date acquired was: 01/27/2021. The wound has been in treatment 1 weeks. The wound is located on the Left,Anterior Lower Leg. The wound measures 3.1cm length x 3.5cm width x 0.1cm depth; 8.522cm^2 area and 0.852cm^3 volume. There is Fat Layer (Subcutaneous Tissue) exposed. There is no tunneling or undermining noted. There is a medium amount of serosanguineous drainage noted. The wound margin is distinct with the outline attached to the wound base. There is large (67-100%) red, pink granulation within the wound bed. There is no necrotic tissue within the wound bed. Wound #3 status is Open. Original cause of wound was Gradually Appeared. The date acquired was: 01/27/2021. The wound has been in treatment 1 weeks. The wound is located on the Left,Posterior Lower Leg. The wound measures 1cm length x 0.6cm width x 0.1cm depth; 0.471cm^2 area and 0.047cm^3 volume. There is Fat Layer (Subcutaneous Tissue) exposed. There is no  tunneling or undermining noted. There is a medium amount of serous drainage noted. The wound margin is distinct with the outline attached to the wound base. There is large (67-100%) pink, pale granulation within the wound bed. There is a small (1- 33%) amount of necrotic tissue within the wound bed including Adherent Slough. Assessment Active Problems ICD-10 Non-pressure chronic ulcer of other part of left lower leg with unspecified severity Lymphedema, not elsewhere classified Venous insufficiency (chronic) (peripheral) Type 2 diabetes mellitus without complications Patient's wounds are stable with some improvement in appearance. Patient declined debridement today. She also does not want to have kerlix/coban. I recommended continuing to use Hydrofera Blue for the dressing. She has a juxta light compression wrap at home that we ordered. I recommended putting this in place. We will  order home health. We will also order supplies for dressing changes and have these sent to her home. I recommended she have some compression therapy as these wounds will likely not heal without swelling control. She expressed understanding. I will see her back in 1 week. Plan Follow-up Appointments: Return Appointment in 1 week. - Dr. Heber [place] Bathing/ Shower/ Hygiene: May shower with protection but do not get wound dressing(s) wet. Edema Control - Lymphedema / SCD / Other: Lymphedema Pumps. Use Lymphedema pumps on leg(s) 2-3 times a day for 45-60 minutes. If wearing any wraps or hose, do not remove them. Continue exercising as instructed. - [facility] has ordered pumps for patient. Elevate legs to the level of the heart or above for 30 minutes daily and/or when sitting, a frequency of: Avoid standing for long periods of time. Exercise regularly Compression stocking or Garment 20-30 mm/Hg pressure to: - Wear Juxtalite to right leg, will order one for left leg. Additional Orders / Instructions: Follow Nutritious  Diet Home Health: Admit to Home Health for wound care. May utilize formulary equivalent dressing for wound treatment orders unless otherwise specified. - [facility] Home health to change 2-3 x a week WOUND #2: - Lower Leg Wound Laterality: Left, Anterior Cleanser: Soap and Water 1 x Per Week/15 Days Discharge Instructions: May shower and wash wound with dial antibacterial soap and water prior to dressing change. Cleanser: Wound Cleanser 1 x Per Week/15 Days Discharge Instructions: Cleanse the wound with wound cleanser prior to applying a clean dressing using gauze sponges, not tissue or cotton balls. Peri-Wound Care: Triamcinolone 15 (g) 1 x Per Week/15 Days Discharge Instructions: Use triamcinolone 15 (g) as directed Peri-Wound Care: Sween Lotion (Moisturizing lotion) 1 x Per Week/15 Days Discharge Instructions: Apply moisturizing lotion as directed Prim Dressing: Hydrofera Blue Ready Foam, 4x5 in 1 x Per Week/15 Days ary Discharge Instructions: Apply to wound bed as instructed Prim Dressing: Santyl Ointment 1 x Per Week/15 Days ary Discharge Instructions: Apply nickel thick amount to wound bed as instructed Secondary Dressing: Woven Gauze Sponge, Non-Sterile 4x4 in 1 x Per Week/15 Days Discharge Instructions: Apply over primary dressing as directed. Secondary Dressing: ABD Pad, 5x9 1 x Per Week/15 Days Discharge Instructions: Apply over primary dressing as directed. WOUND #3: - Lower Leg Wound Laterality: Left, Posterior Cleanser: Soap and Water 1 x Per Week/15 Days Discharge Instructions: May shower and wash wound with dial antibacterial soap and water prior to dressing change. Cleanser: Wound Cleanser 1 x Per Week/15 Days Discharge Instructions: Cleanse the wound with wound cleanser prior to applying a clean dressing using gauze sponges, not tissue or cotton balls. Peri-Wound Care: Triamcinolone 15 (g) 1 x Per Week/15 Days Discharge Instructions: Use triamcinolone 15 (g) as  directed Peri-Wound Care: Sween Lotion (Moisturizing lotion) 1 x Per Week/15 Days Discharge Instructions: Apply moisturizing lotion as directed Prim Dressing: Hydrofera Blue Ready Foam, 4x5 in 1 x Per Week/15 Days ary Discharge Instructions: Apply to wound bed as instructed Prim Dressing: Santyl Ointment 1 x Per Week/15 Days ary Discharge Instructions: Apply nickel thick amount to wound bed as instructed Secondary Dressing: Woven Gauze Sponge, Non-Sterile 4x4 in 1 x Per Week/15 Days Discharge Instructions: Apply over primary dressing as directed. Secondary Dressing: ABD Pad, 5x9 1 x Per Week/15 Days Discharge Instructions: Apply over primary dressing as directed. Com pression Stockings: Circaid Juxta Lite Compression Wrap Compression Amount: 20-30 mmHg (left) Discharge Instructions: Apply Circaid Juxta Lite Compression Wrap daily as instructed. Apply first thing in the  morning, remove at night before bed. 1. Continue Hydrofera Blue 2. Juxta light compression 3. Order home health 4. Order supplies for patient Electronic Signature(s) Signed: 02/17/2021 11:06:53 AM By: Kalman Shan DO Entered By: Kalman Shan on 02/17/2021 11:06:16 -------------------------------------------------------------------------------- HxROS Details Patient Name: Date of Service: Kayla [name], Kayla [place] A. 02/17/2021 9:45 A M Medical Record Number: [id number] Patient Account Number: [id number] Date of Birth/Sex: Treating RN: 1940/08/14 (80 y.o. Kayla [name], Kayla [name] Primary Care Provider: Jill [name] Other Clinician: Referring Provider: Treating Provider/Extender: Kayla [name] in Treatment: 1 Information Obtained From Patient Eyes Medical History: Negative for: Cataracts; Glaucoma; Optic Neuritis Ear/Nose/Mouth/Throat Medical History: Negative for: Chronic sinus problems/congestion; Middle ear problems Hematologic/Lymphatic Medical History: Positive for: Lymphedema Negative  for: Anemia; Hemophilia; Human Immunodeficiency Virus; Sickle Cell Disease Respiratory Medical History: Negative for: Aspiration; Asthma; Chronic Obstructive Pulmonary Disease (COPD); Pneumothorax; Sleep Apnea; Tuberculosis Cardiovascular Medical History: Positive for: Hypertension Negative for: Angina; Arrhythmia; Congestive Heart Failure; Coronary Artery Disease; Deep Vein Thrombosis; Hypotension; Myocardial Infarction; Peripheral Arterial Disease; Peripheral Venous Disease; Phlebitis; Vasculitis Past Medical History Notes: hyperlipidemia Gastrointestinal Medical History: Negative for: Cirrhosis ; Colitis; Crohns; Hepatitis A; Hepatitis B; Hepatitis C Past Medical History Notes: GERD Endocrine Medical History: Positive for: Type II Diabetes Negative for: Type I Diabetes Genitourinary Medical History: Negative for: End Stage Renal Disease Immunological Medical History: Negative for: Lupus Erythematosus; Raynauds; Scleroderma Integumentary (Skin) Medical History: Negative for: History of Burn Musculoskeletal Medical History: Positive for: Rheumatoid Arthritis; Osteoarthritis Negative for: Gout; Osteomyelitis Neurologic Medical History: Positive for: Neuropathy Negative for: Dementia; Quadriplegia; Paraplegia; Seizure Disorder Oncologic Medical History: Negative for: Received Chemotherapy; Received Radiation Immunizations Pneumococcal Vaccine: Received Pneumococcal Vaccination: Yes Implantable Devices None Family and Social History Unknown History: Yes; Never smoker; Marital Status - Widowed; Alcohol Use: Never; Drug Use: No History; Caffeine Use: Moderate; Financial Concerns: No; Food, Clothing or Shelter Needs: No; Support System Lacking: No; Transportation Concerns: No Electronic Signature(s) Signed: 02/17/2021 11:06:53 AM By: Kalman Shan DO Signed: 02/17/2021 7:48:44 PM By: Rhae Hammock RN Entered By: Kalman Shan on 02/17/2021  11:03:13 -------------------------------------------------------------------------------- SuperBill Details Patient Name: Date of Service: Kayla [name], Kayla A. 02/17/2021 Medical Record Number: [id number] Patient Account Number: [id number] Date of Birth/Sex: Treating RN: 07/05/1941 (80 y.o. Kayla [name], Kayla [name] Primary Care Provider: Jill [name] Other Clinician: Referring Provider: Treating Provider/Extender: Kayla [name] in Treatment: 1 Diagnosis Coding ICD-10 Codes Code Description [id number] Non-pressure chronic ulcer of other part of left lower leg with unspecified severity I89.0 Lymphedema, not elsewhere classified I87.2 Venous insufficiency (chronic) (peripheral) E11.9 Type 2 diabetes mellitus without complications Facility Procedures CPT4 Code: [id number] Description: 99213 - WOUND CARE VISIT-LEV 3 EST PT Modifier: Quantity: 1 Physician Procedures : CPT4 Code Description Modifier [id number] 99213 - WC PHYS LEVEL 3 - EST PT ICD-10 Diagnosis Description L97.829 Non-pressure chronic ulcer of other part of left lower leg with unspecified severity L97.829 Non-pressure chronic ulcer of other part of left  lower leg with unspecified severity I89.0 Lymphedema, not elsewhere classified I87.2 Venous insufficiency (chronic) (peripheral) E11.9 Type 2 diabetes mellitus without complications Quantity: 1 Electronic Signature(s) Signed: 02/17/2021 11:39:40 AM By: Kalman Shan DO Signed: 02/17/2021 7:48:44 PM By: Rhae Hammock RN Previous Signature: 02/17/2021 11:06:53 AM Version By: Kalman Shan DO Entered By: Rhae Hammock on 02/17/2021 11:07:32

## 2021-02-18 NOTE — Progress Notes (Signed)
Kayla, [name] (Kayla [name]) Visit Report for 02/17/2021 Arrival Information Details Patient Name: Date of Service: Kayla [name], [name] 02/17/2021 9:45 A M Medical Record Number: Kayla [name] Patient Account Number: [id number] Date of Birth/Sex: Treating RN: [birth year] (80 y.o. Kayla [name], Kayla [name] Primary Care Kayla [name]: Kayla [name] Other Clinician: Referring Kayla [name]: Treating Kayla [name]/Extender: Kayla [name] in Treatment: 1 Visit Information History Since Last Visit Added or deleted any medications: No Patient Arrived: Wheel Chair Any new allergies or adverse reactions: No Arrival Time: 09:58 Had a fall or experienced change in No Accompanied By: self activities of daily living that may affect Transfer Assistance: None risk of falls: Patient Identification Verified: Yes Signs or symptoms of abuse/neglect since last visito No Secondary Verification Process Completed: Yes Hospitalized since last visit: No Patient Requires Transmission-Based Precautions: No Implantable device outside of the clinic excluding No Patient Has Alerts: Yes cellular tissue based products placed in the [name] Patient Alerts: Patient on Blood Thinner since last visit: VVS ABI 6/27 R=0.94 Has Dressing in Place as Prescribed: Yes VVS ABI 6/27 L=Kayla [name] Pain Present Now: Yes Electronic Signature(s) Signed: 02/18/2021 10:49:45 AM By: Sandre Kitty Entered By: Sandre Kitty on 02/17/2021 09:58:33 -------------------------------------------------------------------------------- Clinic Level of Care Assessment Details Patient Name: Date of Service: Kayla [name], [name] A. 02/17/2021 9:45 A M Medical Record Number: Kayla [name] Patient Account Number: [id number] Date of Birth/Sex: Treating RN: 07-26-1941 (80 y.o. Kayla [name], Kayla [name] Primary Care Kayla [name]: Kayla [name] Other Clinician: Referring Yomira Flitton: Treating Kayla [name]/Extender: Kayla [name] in Treatment: 1 Clinic  Level of Care Assessment Items TOOL 4 Quantity Score X- 1 0 Use when only an EandM is performed on FOLLOW-UP visit ASSESSMENTS - Nursing Assessment / Reassessment X- 1 10 Reassessment of Co-morbidities (includes updates in patient status) X- 1 5 Reassessment of Adherence to Treatment Plan ASSESSMENTS - Wound and Skin A ssessment / Reassessment X - Simple Wound Assessment / Reassessment - one wound 1 5 '[]'$  - 0 Complex Wound Assessment / Reassessment - multiple wounds X- 1 10 Dermatologic / Skin Assessment (not related to wound area) ASSESSMENTS - Focused Assessment X- 1 5 Circumferential Edema Measurements - multi extremities '[]'$  - 0 Nutritional Assessment / Counseling / Intervention '[]'$  - 0 Lower Extremity Assessment (monofilament, tuning fork, pulses) '[]'$  - 0 Peripheral Arterial Disease Assessment (using hand held doppler) ASSESSMENTS - Ostomy and/or Continence Assessment and Care '[]'$  - 0 Incontinence Assessment and Management '[]'$  - 0 Ostomy Care Assessment and Management (repouching, etc.) PROCESS - Coordination of Care X - Simple Patient / Family Education for ongoing care 1 15 '[]'$  - 0 Complex (extensive) Patient / Family Education for ongoing care X- 1 10 Staff obtains Programmer, systems, Records, T Results / Process Orders est X- 1 10 Staff telephones HHA, Nursing Homes / Clarify orders / etc '[]'$  - 0 Routine Transfer to another Facility (non-emergent condition) '[]'$  - 0 Routine [name] Admission (non-emergent condition) '[]'$  - 0 New Admissions / Biomedical engineer / Ordering NPWT Apligraf, etc. , '[]'$  - 0 Emergency [name] Admission (emergent condition) X- 1 10 Simple Discharge Coordination '[]'$  - 0 Complex (extensive) Discharge Coordination PROCESS - Special Needs '[]'$  - 0 Pediatric / Minor Patient Management '[]'$  - 0 Isolation Patient Management '[]'$  - 0 Hearing / Language / Visual special needs '[]'$  - 0 Assessment of Community assistance (transportation, D/C planning,  etc.) '[]'$  - 0 Additional assistance / Altered mentation '[]'$  - 0 Support Surface(s) Assessment (bed, cushion, seat, etc.) INTERVENTIONS - Wound Cleansing / Measurement X - Simple Wound  Cleansing - one wound 1 5 '[]'$  - 0 Complex Wound Cleansing - multiple wounds X- 1 5 Wound Imaging (photographs - any number of wounds) '[]'$  - 0 Wound Tracing (instead of photographs) X- 1 5 Simple Wound Measurement - one wound '[]'$  - 0 Complex Wound Measurement - multiple wounds INTERVENTIONS - Wound Dressings X - Small Wound Dressing one or multiple wounds 1 10 '[]'$  - 0 Medium Wound Dressing one or multiple wounds '[]'$  - 0 Large Wound Dressing one or multiple wounds X- 1 5 Application of Medications - topical '[]'$  - 0 Application of Medications - injection INTERVENTIONS - Miscellaneous '[]'$  - 0 External ear exam '[]'$  - 0 Specimen Collection (cultures, biopsies, blood, body fluids, etc.) '[]'$  - 0 Specimen(s) / Culture(s) sent or taken to Lab for analysis '[]'$  - 0 Patient Transfer (multiple staff / Civil Service fast streamer / Similar devices) '[]'$  - 0 Simple Staple / Suture removal (25 or less) '[]'$  - 0 Complex Staple / Suture removal (26 or more) '[]'$  - 0 Hypo / Hyperglycemic Management (close monitor of Blood Glucose) '[]'$  - 0 Ankle / Brachial Index (ABI) - do not check if billed separately X- 1 5 Vital Signs Has the patient been seen at the [name] within the last three years: Yes Total Score: 115 Level Of Care: New/Established - Level 3 Electronic Signature(s) Signed: 02/17/2021 7:48:44 PM By: Kayla Hammock RN Entered By: Kayla [name] on 02/17/2021 11:07:24 -------------------------------------------------------------------------------- Encounter Discharge Information Details Patient Name: Date of Service: Kayla [name], Kayla A. 02/17/2021 9:45 A M Medical Record Number: Kayla [name] Patient Account Number: [id number] Date of Birth/Sex: Treating RN: [birth year] (80 y.o. Kayla [name] Primary Care Sharvil Hoey: Kayla [name] Other Clinician: Referring Vayla Wilhelmi: Treating Kayla Mcgeehan/Extender: Kayla [name] in Treatment: 1 Encounter Discharge Information Items Discharge Condition: Stable Ambulatory Status: Wheelchair Discharge Destination: Home Transportation: Private Auto Schedule Follow-up Appointment: Yes Clinical Summary of Care: Provided on 02/17/2021 Form Type Recipient Paper Patient Patient Electronic Signature(s) Signed: 02/17/2021 6:43:27 PM By: Lorrin Jackson Entered By: Lorrin Jackson on 02/17/2021 11:18:40 -------------------------------------------------------------------------------- Lower Extremity Assessment Details Patient Name: Date of Service: Kayla [name], Kayla [name], [name] A. 02/17/2021 9:45 A M Medical Record Number: Kayla [name] Patient Account Number: [id number] Date of Birth/Sex: Treating RN: [birth year] (80 y.o. Kayla [name], Kayla [name] Primary Care Lloyd Cullinan: Kayla [name] Other Clinician: Referring Blakley Michna: Treating Faraz Ponciano/Extender: Kayla [name] in Treatment: 1 Edema Assessment Assessed: Shirlyn Goltz: Yes] Patrice [name]: No] Edema: [Left: Ye] [Right: s] Calf Left: Right: Point of Measurement: 31 cm From Medial Instep 52 cm Ankle Left: Right: Point of Measurement: 8 cm From Medial Instep 31 cm Vascular Assessment Pulses: Dorsalis Pedis Palpable: [Left:Yes] Electronic Signature(s) Signed: 02/17/2021 7:48:44 PM By: Kayla Hammock RN Signed: 02/18/2021 10:49:45 AM By: Sandre Kitty Entered By: Sandre Kitty on 02/17/2021 10:08:29 -------------------------------------------------------------------------------- Multi Wound Chart Details Patient Name: Date of Service: Kayla [name], [place] A. 02/17/2021 9:45 A M Medical Record Number: Kayla [name] Patient Account Number: [id number] Date of Birth/Sex: Treating RN: 11-30-1940 (80 y.o. Kayla [name], Kayla [name] Primary Care Sherly Brodbeck: Kayla [name] Other Clinician: Referring Hinton Luellen: Treating Jamontae Thwaites/Extender:  Kayla [name] in Treatment: 1 Vital Signs Height(in): Pulse(bpm): 5 Weight(lbs): Blood Pressure(mmHg): 126/71 Body Mass Index(BMI): Temperature(F): 98.3 Respiratory Rate(breaths/min): 18 Photos: [N/A:N/A] Left, Anterior Lower Leg Left, Posterior Lower Leg N/A Wound Location: Gradually Appeared Gradually Appeared N/A Wounding Event: Venous Leg Ulcer Venous Leg Ulcer N/A Primary Etiology: Lymphedema, Hypertension, Type II Lymphedema, Hypertension, Type II N/A Comorbid History: Diabetes, Rheumatoid Arthritis, Diabetes, Rheumatoid Arthritis, Osteoarthritis, Neuropathy Osteoarthritis, Neuropathy 01/27/2021  01/27/2021 N/A Date Acquired: 1 1 N/A Weeks of Treatment: Open Open N/A Wound Status: 3.1x3.5x0.1 1x0.6x0.1 N/A Measurements L x W x D (cm) 8.522 0.471 N/A A (cm) : rea 0.852 0.047 N/A Volume (cm) : 3.10% -42.70% N/A % Reduction in Area: 3.20% -42.40% N/A % Reduction in Volume: Full Thickness Without Exposed Full Thickness Without Exposed N/A Classification: Support Structures Support Structures Medium Medium N/A Exudate Amount: Serosanguineous Serous N/A Exudate Type: red, brown amber N/A Exudate Color: Distinct, outline attached Distinct, outline attached N/A Wound Margin: Large (67-100%) Large (67-100%) N/A Granulation Amount: Red, Pink Pink, Pale N/A Granulation Quality: None Present (0%) Small (1-33%) N/A Necrotic Amount: Fat Layer (Subcutaneous Tissue): Yes Fat Layer (Subcutaneous Tissue): Yes N/A Exposed Structures: Fascia: No Fascia: No Tendon: No Tendon: No Muscle: No Muscle: No Joint: No Joint: No Bone: No Bone: No Small (1-33%) None N/A Epithelialization: Treatment Notes Electronic Signature(s) Signed: 02/17/2021 11:06:53 AM By: Kalman Shan DO Signed: 02/17/2021 7:48:44 PM By: Kayla Hammock RN Entered By: Kalman Shan on 02/17/2021  10:59:28 -------------------------------------------------------------------------------- Multi-Disciplinary Care Plan Details Patient Name: Date of Service: Kayla [name], Kayla [facility] A. 02/17/2021 9:45 A M Medical Record Number: Kayla [name] Patient Account Number: [id number] Date of Birth/Sex: Treating RN: 08-27-1940 (80 y.o. Kayla [name], Kayla [name] Primary Care Inayah Woodin: Kayla [name] Other Clinician: Referring Admir Candelas: Treating Nikka Hakimian/Extender: Kayla [name] in Treatment: 1 Active Inactive Venous Leg Ulcer Nursing Diagnoses: Actual venous Insuffiency (use after diagnosis is confirmed) Goals: Patient will maintain optimal edema control Date Initiated: 02/10/2021 Target Resolution Date: 03/10/2021 Goal Status: Active Patient/caregiver will verbalize understanding of disease process and disease management Date Initiated: 02/10/2021 Target Resolution Date: 03/10/2021 Goal Status: Active Interventions: Assess peripheral edema status every visit. Compression as ordered Treatment Activities: Therapeutic compression applied : 02/10/2021 Notes: Wound/Skin Impairment Nursing Diagnoses: Impaired tissue integrity Goals: Ulcer/skin breakdown will have a volume reduction of 30% by week 4 Date Initiated: 02/10/2021 Target Resolution Date: 03/17/2021 Goal Status: Active Interventions: Assess patient/caregiver ability to obtain necessary supplies Assess patient/caregiver ability to perform ulcer/skin care regimen upon admission and as needed Assess ulceration(s) every visit Provide education on ulcer and skin care Treatment Activities: Skin care regimen initiated : 02/10/2021 Topical wound management initiated : 02/10/2021 Notes: Electronic Signature(s) Signed: 02/17/2021 7:48:44 PM By: Kayla Hammock RN Entered By: Kayla [name] on 02/17/2021 11:06:21 -------------------------------------------------------------------------------- Pain Assessment Details Patient  Name: Date of Service: Kayla [name], [name] A. 02/17/2021 9:45 A M Medical Record Number: Kayla [name] Patient Account Number: [id number] Date of Birth/Sex: Treating RN: 1941/07/18 (80 y.o. Kayla [name], Kayla [name] Primary Care Luvia Orzechowski: Kayla [name] Other Clinician: Referring Marisue Canion: Treating Hennessey Cantrell/Extender: Kayla [name] in Treatment: 1 Active Problems Location of Pain Severity and Description of Pain Patient Has Paino Yes Site Locations Rate the pain. Current Pain Level: 10 Pain Management and Medication Current Pain Management: Electronic Signature(s) Signed: 02/17/2021 7:48:44 PM By: Kayla Hammock RN Signed: 02/18/2021 10:49:45 AM By: Sandre Kitty Entered By: Sandre Kitty on 02/17/2021 09:58:58 -------------------------------------------------------------------------------- Patient/Caregiver Education Details Patient Name: Date of Service: Kayla [name], Milas Gain 7/21/2022andnbsp9:45 [place] Record Number: Kayla [name] Patient Account Number: [id number] Date of Birth/Gender: Treating RN: Jun 24, 1941 (80 y.o. Benjaman Lobe Primary Care Physician: Kayla [name] Other Clinician: Referring Physician: Treating Physician/Extender: Kayla [name] in Treatment: 1 Education Assessment Education Provided To: Patient Education Topics Provided Wound/Skin Impairment: Methods: Explain/Verbal Responses: State content correctly Electronic Signature(s) Signed: 02/17/2021 7:48:44 PM By: Kayla Hammock RN Entered By: Kayla [name] on 02/17/2021 11:06:50 -------------------------------------------------------------------------------- Wound Assessment  Details Patient Name: Date of Service: Kayla [name], [name] 02/17/2021 9:45 A M Medical Record Number: Kayla [name] Patient Account Number: [id number] Date of Birth/Sex: Treating RN: October 31, 1940 (80 y.o. Kayla [name], Kayla [name] Primary Care Rayleigh Gillyard: Kayla [name] Other  Clinician: Referring Litzi Binning: Treating Kamauri Kathol/Extender: Kayla [name] in Treatment: 1 Wound Status Wound Number: 2 Primary Venous Leg Ulcer Etiology: Wound Location: Left, Anterior Lower Leg Wound Open Wounding Event: Gradually Appeared Status: Date Acquired: 01/27/2021 Comorbid Lymphedema, Hypertension, Type II Diabetes, Rheumatoid Weeks Of Treatment: 1 History: Arthritis, Osteoarthritis, Neuropathy Clustered Wound: No Photos Wound Measurements Length: (cm) 3.1 Width: (cm) 3.5 Depth: (cm) 0.1 Area: (cm) 8.522 Volume: (cm) 0.852 % Reduction in Area: 3.1% % Reduction in Volume: 3.2% Epithelialization: Small (1-33%) Tunneling: No Undermining: No Wound Description Classification: Full Thickness Without Exposed Support Structures Wound Margin: Distinct, outline attached Exudate Amount: Medium Exudate Type: Serosanguineous Exudate Color: red, brown Foul Odor After Cleansing: No Slough/Fibrino No Wound Bed Granulation Amount: Large (67-100%) Exposed Structure Granulation Quality: Red, Pink Fascia Exposed: No Necrotic Amount: None Present (0%) Fat Layer (Subcutaneous Tissue) Exposed: Yes Tendon Exposed: No Muscle Exposed: No Joint Exposed: No Bone Exposed: No Treatment Notes Wound #2 (Lower Leg) Wound Laterality: Left, Anterior Cleanser Soap and Water Discharge Instruction: May shower and wash wound with dial antibacterial soap and water prior to dressing change. Wound Cleanser Discharge Instruction: Cleanse the wound with wound cleanser prior to applying a clean dressing using gauze sponges, not tissue or cotton balls. Peri-Wound Care Triamcinolone 15 (g) Discharge Instruction: Use triamcinolone 15 (g) as directed Sween Lotion (Moisturizing lotion) Discharge Instruction: Apply moisturizing lotion as directed Topical Primary Dressing Hydrofera Kayla Ready Foam, 4x5 in Discharge Instruction: Apply to wound bed as instructed Santyl  Ointment Discharge Instruction: Apply nickel thick amount to wound bed as instructed Secondary Dressing Woven Gauze Sponge, Non-Sterile 4x4 in Discharge Instruction: Apply over primary dressing as directed. ABD Pad, 5x9 Discharge Instruction: Apply over primary dressing as directed. Secured With Compression Wrap Compression Stockings Add-Ons Electronic Signature(s) Signed: 02/17/2021 11:06:53 AM By: Kalman Shan DO Signed: 02/17/2021 7:48:44 PM By: Kayla Hammock RN Entered By: Kalman Shan on 02/17/2021 10:49:54 -------------------------------------------------------------------------------- Wound Assessment Details Patient Name: Date of Service: Kayla [name], Kayla A. 02/17/2021 9:45 A M Medical Record Number: Kayla [name] Patient Account Number: [id number] Date of Birth/Sex: Treating RN: 1941-04-28 (80 y.o. Kayla [name], Kayla [name] Primary Care Jalayna Josten: Kayla [name] Other Clinician: Referring Trayven Lumadue: Treating Mishayla Sliwinski/Extender: Kayla [name] in Treatment: 1 Wound Status Wound Number: 3 Primary Venous Leg Ulcer Etiology: Wound Location: Left, Posterior Lower Leg Wound Open Wounding Event: Gradually Appeared Status: Date Acquired: 01/27/2021 Comorbid Lymphedema, Hypertension, Type II Diabetes, Rheumatoid Weeks Of Treatment: 1 History: Arthritis, Osteoarthritis, Neuropathy Clustered Wound: No Photos Wound Measurements Length: (cm) 1 Width: (cm) 0.6 Depth: (cm) 0.1 Area: (cm) 0.471 Volume: (cm) 0.047 % Reduction in Area: -42.7% % Reduction in Volume: -42.4% Epithelialization: None Tunneling: No Undermining: No Wound Description Classification: Full Thickness Without Exposed Support Structures Wound Margin: Distinct, outline attached Exudate Amount: Medium Exudate Type: Serous Exudate Color: amber Foul Odor After Cleansing: No Slough/Fibrino Yes Wound Bed Granulation Amount: Large (67-100%) Exposed Structure Granulation Quality:  Pink, Pale Fascia Exposed: No Necrotic Amount: Small (1-33%) Fat Layer (Subcutaneous Tissue) Exposed: Yes Necrotic Quality: Adherent Slough Tendon Exposed: No Muscle Exposed: No Joint Exposed: No Bone Exposed: No Treatment Notes Wound #3 (Lower Leg) Wound Laterality: Left, Posterior Cleanser Soap and Water Discharge Instruction: May shower and wash wound with dial antibacterial soap  and water prior to dressing change. Wound Cleanser Discharge Instruction: Cleanse the wound with wound cleanser prior to applying a clean dressing using gauze sponges, not tissue or cotton balls. Peri-Wound Care Triamcinolone 15 (g) Discharge Instruction: Use triamcinolone 15 (g) as directed Sween Lotion (Moisturizing lotion) Discharge Instruction: Apply moisturizing lotion as directed Topical Primary Dressing Hydrofera Kayla Ready Foam, 4x5 in Discharge Instruction: Apply to wound bed as instructed Santyl Ointment Discharge Instruction: Apply nickel thick amount to wound bed as instructed Secondary Dressing Woven Gauze Sponge, Non-Sterile 4x4 in Discharge Instruction: Apply over primary dressing as directed. ABD Pad, 5x9 Discharge Instruction: Apply over primary dressing as directed. Secured With Compression Wrap Compression Stockings Circaid Juxta Lite Compression Wrap Quantity: 1 Left Leg Compression Amount: 20-30 mmHg Discharge Instruction: Apply Circaid Juxta Lite Compression Wrap daily as instructed. Apply first thing in the morning, remove at night before bed. Add-Ons Electronic Signature(s) Signed: 02/17/2021 11:06:53 AM By: Kalman Shan DO Signed: 02/17/2021 7:48:44 PM By: Kayla Hammock RN Entered By: Kalman Shan on 02/17/2021 10:55:52 -------------------------------------------------------------------------------- Vitals Details Patient Name: Date of Service: Kayla [name], Kayla A. 02/17/2021 9:45 A M Medical Record Number: Kayla [name] Patient Account Number: [id number] Date  of Birth/Sex: Treating RN: 1941-07-23 (80 y.o. Kayla [name], Kayla [name] Primary Care Kavya Haag: Kayla [name] Other Clinician: Referring Maniyah Moller: Treating Kailiana Granquist/Extender: Kayla [name] in Treatment: 1 Vital Signs Time Taken: 09:58 Temperature (F): 98.3 Pulse (bpm): 94 Respiratory Rate (breaths/min): 18 Blood Pressure (mmHg): 126/71 Reference Range: 80 - 120 mg / dl Electronic Signature(s) Signed: 02/18/2021 10:49:45 AM By: Sandre Kitty Entered By: Sandre Kitty on 02/17/2021 09:58:46

## 2021-02-22 DIAGNOSIS — S81809A Unspecified open wound, unspecified lower leg, initial encounter: Secondary | ICD-10-CM | POA: Diagnosis not present

## 2021-02-24 ENCOUNTER — Encounter (HOSPITAL_BASED_OUTPATIENT_CLINIC_OR_DEPARTMENT_OTHER): Payer: Medicare PPO | Admitting: Internal Medicine

## 2021-02-24 ENCOUNTER — Other Ambulatory Visit: Payer: Self-pay

## 2021-02-24 DIAGNOSIS — I872 Venous insufficiency (chronic) (peripheral): Secondary | ICD-10-CM

## 2021-02-24 DIAGNOSIS — L97829 Non-pressure chronic ulcer of other part of left lower leg with unspecified severity: Secondary | ICD-10-CM

## 2021-02-24 DIAGNOSIS — E119 Type 2 diabetes mellitus without complications: Secondary | ICD-10-CM | POA: Diagnosis not present

## 2021-02-24 DIAGNOSIS — S81809A Unspecified open wound, unspecified lower leg, initial encounter: Secondary | ICD-10-CM | POA: Diagnosis not present

## 2021-02-24 DIAGNOSIS — I890 Lymphedema, not elsewhere classified: Secondary | ICD-10-CM | POA: Diagnosis not present

## 2021-02-24 DIAGNOSIS — E11622 Type 2 diabetes mellitus with other skin ulcer: Secondary | ICD-10-CM | POA: Diagnosis not present

## 2021-02-24 DIAGNOSIS — L97822 Non-pressure chronic ulcer of other part of left lower leg with fat layer exposed: Secondary | ICD-10-CM | POA: Diagnosis not present

## 2021-02-24 NOTE — Progress Notes (Signed)
LOVELLA, KUSLER ([id number]) Visit Report for 02/24/2021 Chief Complaint Document Details Patient Name: Date of Service: Kayla [name] [name], Kayla [name] 02/24/2021 12:45 PM Medical Record Number: [id number] Patient Account Number: [id number] Date of Birth/Sex: Treating RN: Jul 19, 1941 (80 y.o. [name] Primary Care Provider: Jill Alexanders Other Clinician: Referring Provider: Treating Provider/Extender: [name] in Treatment: 2 Information Obtained from: Patient Chief Complaint left lower extremity wounds Electronic Signature(s) Signed: 02/24/2021 2:43:13 PM By: Kalman Shan DO Entered By: Kalman Shan on 02/24/2021 14:36:18 -------------------------------------------------------------------------------- Debridement Details Patient Name: Date of Service: Kayla [name] [name], Kayla A. 02/24/2021 12:45 PM Medical Record Number: [id number] Patient Account Number: [id number] Date of Birth/Sex: Treating RN: January 31, 1941 (80 y.o. [name] Primary Care Provider: Jill Alexanders Other Clinician: Referring Provider: Treating Provider/Extender: [name] in Treatment: 2 Debridement Performed for Assessment: Wound #2 Left,Anterior Lower Leg Performed By: Clinician Deon Pilling, RN Debridement Type: Chemical/Enzymatic/Mechanical Agent Used: Santyl Severity of Tissue Pre Debridement: Fat layer exposed Level of Consciousness (Pre-procedure): Awake and Alert Pre-procedure Verification/Time Out No Taken: Bleeding: None Response to Treatment: Procedure was tolerated well Level of Consciousness (Post- Awake and Alert procedure): Post Debridement Measurements of Total Wound Length: (cm) 0.5 Width: (cm) 1 Depth: (cm) 0.1 Volume: (cm) 0.039 Character of Wound/Ulcer Post Debridement: Improved Severity of Tissue Post Debridement: Fat layer exposed Post Procedure Diagnosis Same as Pre-procedure Electronic Signature(s) Signed: 02/24/2021 2:43:13 PM  By: Kalman Shan DO Signed: 02/24/2021 5:49:27 PM By: Deon Pilling Entered By: Deon Pilling on 02/24/2021 13:48:21 -------------------------------------------------------------------------------- HPI Details Patient Name: Date of Service: Kayla [name], Kayla A. 02/24/2021 12:45 PM Medical Record Number: [id number] Patient Account Number: [id number] Date of Birth/Sex: Treating RN: 09-10-1940 (80 y.o. [name] Primary Care Provider: Jill Alexanders Other Clinician: Referring Provider: Treating Provider/Extender: [name] in Treatment: 2 History of Present Illness HPI Description: Admission 5/20 Ms. Kayla [name] is an 80 year old female with a past medical history of type 2 diabetes on oral agents, chronic venous insufficiency and lymphedema that presents to our clinic for bilateral lower extremity swelling with a small open wound to the right lower extremity. She states that her swelling has increased over the past year. She does not use compression stockings although she does own some. She states these are too hard to get on. She tells me she does not have a history of open wounds however in the EMR it is noted that she has had several cases of open wounds to her legs. She reports pain to shin of her left lower extremity. She is currently on doxycycline for cellulitis of her calf. She states that the pain to her shin has been present for the past 6 months and has not gotten better. There are no open wounds to the left lower extremity. 5/27; patient presents for 1 week follow-up. Her wound has healed on her right lower extremity. She has no open wounds to her left lower extremity. She had an ultrasound of an suspicious area last week on the left lower extremity that could possibly have been an abscess. She received her juxta light compression and has it today. She denies signs of infection. Admission 7/14 Ms. Kayla [name] presents for a left lower extremity wound  That occurred 2 weeks ago. She was evaluated by vein and vascular and she states that they are ordering her lymphedema pumps. She is currently keeping the areas covered. She denies signs of infection. She reports tenderness to the area. 7/21; patient presents for  1 week follow-up. She had a Kerlix/Coban wrap placed at last clinic visit that she rolled down a fourth of the way due to itching. She no longer wants the wrap in place. She also declines debridement today. She would like home health to help with wound care dressings. She states she has lymphedema pumps coming in the mail however she has no way of putting these on her taking these off. 7/28; patient presents for 1 week follow-up. She has not used any compression therapy to her legs. She has pumps at home however has not been able to put them on. She has not heard from home health. She has kept the dressing on since last clinic visit. She denies any issues. She denies signs of infection. Electronic Signature(s) Signed: 02/24/2021 2:43:13 PM By: Kalman Shan DO Entered By: Kalman Shan on 02/24/2021 14:37:05 -------------------------------------------------------------------------------- Physical Exam Details Patient Name: Date of Service: Kayla [name], Kayla A. 02/24/2021 12:45 PM Medical Record Number: [id number] Patient Account Number: [id number] Date of Birth/Sex: Treating RN: 30-Sep-1940 (80 y.o. [name] Primary Care Provider: Jill Alexanders Other Clinician: Referring Provider: Treating Provider/Extender: [name] in Treatment: 2 Constitutional respirations regular, non-labored and within target range for patient.[name] Psychiatric pleasant and cooperative. Notes Left lower extremity: 3+ pitting edema to the knee. Open wounds to the anterior aspect with granulation tissue present. Lymphedema skin changes present. No signs of infection. Epithelialization to the posterior aspect at previous wound  site. Electronic Signature(s) Signed: 02/24/2021 2:43:13 PM By: Kalman Shan DO Signed: 02/24/2021 2:43:13 PM By: Kalman Shan DO Entered By: Kalman Shan on 02/24/2021 14:38:56 -------------------------------------------------------------------------------- Physician Orders Details Patient Name: Date of Service: [facility] [name], Elaysia A. 02/24/2021 12:45 PM Medical Record Number: [id number] Patient Account Number: [id number] Date of Birth/Sex: Treating RN: [birth year] (80 y.o. [name] Primary Care Provider: Jill Alexanders Other Clinician: Referring Provider: Treating Provider/Extender: [name] in Treatment: 2 Verbal / Phone Orders: No Diagnosis Coding ICD-10 Coding Code Description [id number] Non-pressure chronic ulcer of other part of left lower leg with unspecified severity I89.0 Lymphedema, not elsewhere classified I87.2 Venous insufficiency (chronic) (peripheral) E11.9 Type 2 diabetes mellitus without complications Follow-up Appointments ppointment in 1 week. - Dr. Heber [place] Return A Bathing/ Shower/ Hygiene May shower and wash wound with soap and water. Edema Control - Lymphedema / SCD / Other Lymphedema Pumps. Use Lymphedema pumps on leg(s) 2-3 times a day for 45-60 minutes. If wearing any wraps or hose, do not remove them. Continue exercising as instructed. - use 2 times a day. Elevate legs to the level of the heart or above for 30 minutes daily and/or when sitting, a frequency of: - throughout the day. Avoid standing for long periods of time. Exercise regularly Compression stocking or Garment 20-30 mm/Hg pressure to: - wear juxtalites compression garment to both legs. apply in the morning and remove at night. Additional Orders / Instructions Follow [facility] to [place] for wound care. May utilize formulary equivalent dressing for wound treatment orders unless otherwise specified. - A Home health to change  2 times a week. New wound care orders this week; continue Home Health for wound care. May utilize formulary equivalent dressing for wound treatment orders unless otherwise specified. Wound Treatment Wound #2 - Lower Leg Wound Laterality: Left, Anterior Cleanser: Soap and Water Every Other Day/15 Days Discharge Instructions: May shower and wash wound with dial antibacterial soap and water prior to dressing change. Cleanser: Wound Cleanser (Generic) Every Other  Day/15 Days Discharge Instructions: Cleanse the wound with wound cleanser prior to applying a clean dressing using gauze sponges, not tissue or cotton balls. Peri-Wound Care: Triamcinolone 15 (g) Every Other Day/15 Days Discharge Instructions: in clinic only. Use triamcinolone 15 (g) as directed Peri-Wound Care: Sween Lotion (Moisturizing lotion) Every Other Day/15 Days Discharge Instructions: Apply moisturizing lotion as directed Prim Dressing: Hydrofera Blue Ready Foam, 4x5 in (Generic) Every Other Day/15 Days ary Discharge Instructions: Apply to wound bed as instructed Prim Dressing: Santyl Ointment Every Other Day/15 Days ary Discharge Instructions: Apply nickel thick amount to wound bed as instructed Secondary Dressing: Woven Gauze Sponge, Non-Sterile 4x4 in (Generic) Every Other Day/15 Days Discharge Instructions: Apply over primary dressing as directed. Secondary Dressing: ABD Pad, 5x9 (Generic) Every Other Day/15 Days Discharge Instructions: Apply over primary dressing as directed. Secured With: [employer], 4.5x3.1 (in/yd) Every Other Day/15 Days Discharge Instructions: Secure with Kerlix as directed. Secured With: 68M Medipore H Soft Cloth Surgical T 4 x 2 (in/yd) Every Other Day/15 Days ape Discharge Instructions: Secure dressing with tape as directed. Electronic Signature(s) Signed: 02/24/2021 2:43:13 PM By: Kalman Shan DO Entered By: Kalman Shan on 02/24/2021  14:39:10 -------------------------------------------------------------------------------- Problem List Details Patient Name: Date of Service: Kayla [name] [name], Symone A. 02/24/2021 12:45 PM Medical Record Number: [id number] Patient Account Number: [id number] Date of Birth/Sex: Treating RN: 1941-03-30 (80 y.o. [name] Primary Care Provider: Jill Alexanders Other Clinician: Referring Provider: Treating Provider/Extender: [name] in Treatment: 2 Active Problems ICD-10 Encounter Code Description Active Date MDM Diagnosis L97.829 Non-pressure chronic ulcer of other part of left lower leg with unspecified 02/10/2021 No Yes severity I89.0 Lymphedema, not elsewhere classified 02/10/2021 No Yes I87.2 Venous insufficiency (chronic) (peripheral) 02/10/2021 No Yes E11.9 Type 2 diabetes mellitus without complications 0000000 No Yes Inactive Problems Resolved Problems Electronic Signature(s) Signed: 02/24/2021 2:43:13 PM By: Kalman Shan DO Entered By: Kalman Shan on 02/24/2021 14:36:02 -------------------------------------------------------------------------------- Progress Note Details Patient Name: Date of Service: Kayla [name], Stanislawa A. 02/24/2021 12:45 PM Medical Record Number: [id number] Patient Account Number: [id number] Date of Birth/Sex: Treating RN: 09/06/1940 (80 y.o. [name] Primary Care Provider: Other Clinician: Jill Alexanders Referring Provider: Treating Provider/Extender: [name] in Treatment: 2 Subjective Chief Complaint Information obtained from Patient left lower extremity wounds History of Present Illness (HPI) Admission 5/20 Ms. Dierra Braff is an 80 year old female with a past medical history of type 2 diabetes on oral agents, chronic venous insufficiency and lymphedema that presents to our clinic for bilateral lower extremity swelling with a small open wound to the right lower extremity. She  states that her swelling has increased over the past year. She does not use compression stockings although she does own some. She states these are too hard to get on. She tells me she does not have a history of open wounds however in the EMR it is noted that she has had several cases of open wounds to her legs. She reports pain to shin of her left lower extremity. She is currently on doxycycline for cellulitis of her calf. She states that the pain to her shin has been present for the past 6 months and has not gotten better. There are no open wounds to the left lower extremity. 5/27; patient presents for 1 week follow-up. Her wound has healed on her right lower extremity. She has no open wounds to her left lower extremity. She had an ultrasound of an suspicious area last week on the  left lower extremity that could possibly have been an abscess. She received her juxta light compression and has it today. She denies signs of infection. Admission 7/14 Ms. Kayla [name] presents for a left lower extremity wound That occurred 2 weeks ago. She was evaluated by vein and vascular and she states that they are ordering her lymphedema pumps. She is currently keeping the areas covered. She denies signs of infection. She reports tenderness to the area. 7/21; patient presents for 1 week follow-up. She had a Kerlix/Coban wrap placed at last clinic visit that she rolled down a fourth of the way due to itching. She no longer wants the wrap in place. She also declines debridement today. She would like home health to help with wound care dressings. She states she has lymphedema pumps coming in the mail however she has no way of putting these on her taking these off. 7/28; patient presents for 1 week follow-up. She has not used any compression therapy to her legs. She has pumps at home however has not been able to put them on. She has not heard from home health. She has kept the dressing on since last clinic visit. She denies any  issues. She denies signs of infection. Patient History Information obtained from Patient. Family History Unknown History. Social History Never smoker, Marital Status - Widowed, Alcohol Use - Never, Drug Use - No History, Caffeine Use - Moderate. Medical History Eyes Denies history of Cataracts, Glaucoma, Optic Neuritis Ear/Nose/Mouth/Throat Denies history of Chronic sinus problems/congestion, Middle ear problems Hematologic/Lymphatic Patient has history of Lymphedema Denies history of Anemia, Hemophilia, Human Immunodeficiency Virus, Sickle Cell Disease Respiratory Denies history of Aspiration, Asthma, Chronic Obstructive Pulmonary Disease (COPD), Pneumothorax, Sleep Apnea, Tuberculosis Cardiovascular Patient has history of Hypertension Denies history of Angina, Arrhythmia, Congestive Heart Failure, Coronary Artery Disease, Deep Vein Thrombosis, Hypotension, Myocardial Infarction, Peripheral Arterial Disease, Peripheral Venous Disease, Phlebitis, Vasculitis Gastrointestinal Denies history of Cirrhosis , Colitis, Crohnoos, Hepatitis A, Hepatitis B, Hepatitis C Endocrine Patient has history of Type II Diabetes Denies history of Type I Diabetes Genitourinary Denies history of End Stage Renal Disease Immunological Denies history of Lupus Erythematosus, Raynaudoos, Scleroderma Integumentary (Skin) Denies history of History of Burn Musculoskeletal Patient has history of Rheumatoid Arthritis, Osteoarthritis Denies history of Gout, Osteomyelitis Neurologic Patient has history of Neuropathy Denies history of Dementia, Quadriplegia, Paraplegia, Seizure Disorder Oncologic Denies history of Received Chemotherapy, Received Radiation Medical A Surgical History Notes nd Cardiovascular hyperlipidemia Gastrointestinal GERD Objective Constitutional respirations regular, non-labored and within target range for patient.. Vitals Time Taken: 12:47 PM, Height: 64 in, Source: Stated,  Weight: 228 lbs, Source: Stated, BMI: 39.1, Temperature: 98.6 F, Pulse: 91 bpm, Respiratory Rate: 18 breaths/min, Blood Pressure: 157/70 mmHg, Capillary Blood Glucose: 108 mg/dl. General Notes: glucose per pt report this am Psychiatric pleasant and cooperative. General Notes: Left lower extremity: 3+ pitting edema to the knee. Open wounds to the anterior aspect with granulation tissue present. Lymphedema skin changes present. No signs of infection. Epithelialization to the posterior aspect at previous wound site. Integumentary (Hair, Skin) Wound #2 status is Open. Original cause of wound was Gradually Appeared. The date acquired was: 01/27/2021. The wound has been in treatment 2 weeks. The wound is located on the Left,Anterior Lower Leg. The wound measures 0.5cm length x 1cm width x 0.1cm depth; 0.393cm^2 area and 0.039cm^3 volume. There is Fat Layer (Subcutaneous Tissue) exposed. There is no tunneling or undermining noted. There is a small amount of serosanguineous drainage noted. The wound margin is indistinct  and nonvisible. There is medium (34-66%) pink granulation within the wound bed. There is no necrotic tissue within the wound bed. Wound #3 status is Healed - Epithelialized. Original cause of wound was Gradually Appeared. The date acquired was: 01/27/2021. The wound has been in treatment 2 weeks. The wound is located on the Left,Posterior Lower Leg. The wound measures 0cm length x 0cm width x 0cm depth; 0cm^2 area and 0cm^3 volume. The wound is limited to skin breakdown. There is no tunneling or undermining noted. There is a small amount of serous drainage noted. The wound margin is distinct with the outline attached to the wound base. There is no granulation within the wound bed. There is no necrotic tissue within the wound bed. Assessment Active Problems ICD-10 Non-pressure chronic ulcer of other part of left lower leg with unspecified severity Lymphedema, not elsewhere  classified Venous insufficiency (chronic) (peripheral) Type 2 diabetes mellitus without complications Patient's wounds have improved in size and appearance. No signs of infection on exam. The posterior wound is healed. She has not used any compression and I am surprised these wounds have improved. She does not want compression wraps today. She also does not want to wear her juxta lite compressions. We discussed the potential worsening of wounds without compression therapy. I recommended continuing with Hydrofera Blue. I asked her to change this every other day. We will touch base with home health to see if they are able to accept the patient. Procedures Wound #2 Pre-procedure diagnosis of Wound #2 is a Venous Leg Ulcer located on the Left,Anterior Lower Leg .Severity of Tissue Pre Debridement is: Fat layer exposed. There was a Chemical/Enzymatic/Mechanical debridement performed by Deon Pilling, RN.[name] Agent used was [employer]. There was no bleeding. The procedure was tolerated well. Post Debridement Measurements: 0.5cm length x 1cm width x 0.1cm depth; 0.039cm^3 volume. Character of Wound/Ulcer Post Debridement is improved. Severity of Tissue Post Debridement is: Fat layer exposed. Post procedure Diagnosis Wound #2: Same as Pre-Procedure Plan Follow-up Appointments: Return Appointment in 1 week. - Dr. Heber [place] Bathing/ Shower/ Hygiene: May shower and wash wound with soap and water. Edema Control - Lymphedema / SCD / Other: Lymphedema Pumps. Use Lymphedema pumps on leg(s) 2-3 times a day for 45-60 minutes. If wearing any wraps or hose, do not remove them. Continue exercising as instructed. - use 2 times a day. Elevate legs to the level of the heart or above for 30 minutes daily and/or when sitting, a frequency of: - throughout the day. Avoid standing for long periods of time. Exercise regularly Compression stocking or Garment 20-30 mm/Hg pressure to: - wear juxtalites compression garment to both  legs. apply in the morning and remove at night. Additional Orders / Instructions: Follow Nutritious Diet Home Health: Admit to Home Health for wound care. May utilize formulary equivalent dressing for wound treatment orders unless otherwise specified. - Home health to change 2 times a week. New wound care orders this week; continue Home Health for wound care. May utilize formulary equivalent dressing for wound treatment orders unless otherwise specified. WOUND #2: - Lower Leg Wound Laterality: Left, Anterior Cleanser: Soap and Water Every Other Day/15 Days Discharge Instructions: May shower and wash wound with dial antibacterial soap and water prior to dressing change. Cleanser: Wound Cleanser (Generic) Every Other Day/15 Days Discharge Instructions: Cleanse the wound with wound cleanser prior to applying a clean dressing using gauze sponges, not tissue or cotton balls. Peri-Wound Care: Triamcinolone 15 (g) Every Other Day/15 Days Discharge Instructions: in clinic  only. Use triamcinolone 15 (g) as directed Peri-Wound Care: Sween Lotion (Moisturizing lotion) Every Other Day/15 Days Discharge Instructions: Apply moisturizing lotion as directed Prim Dressing: Hydrofera Blue Ready Foam, 4x5 in (Generic) Every Other Day/15 Days ary Discharge Instructions: Apply to wound bed as instructed Prim Dressing: Santyl Ointment Every Other Day/15 Days ary Discharge Instructions: Apply nickel thick amount to wound bed as instructed Secondary Dressing: Woven Gauze Sponge, Non-Sterile 4x4 in (Generic) Every Other Day/15 Days Discharge Instructions: Apply over primary dressing as directed. Secondary Dressing: ABD Pad, 5x9 (Generic) Every Other Day/15 Days Discharge Instructions: Apply over primary dressing as directed. Secured With: [employer], 4.5x3.1 (in/yd) Every Other Day/15 Days Discharge Instructions: Secure with Kerlix as directed. Secured With: 73M Medipore H Soft Cloth Surgical T 4 x 2  (in/yd) Every Other Day/15 Days ape Discharge Instructions: Secure dressing with tape as directed. 1. Continue Hydrofera Blue every other day 2. Follow-up in 1 week Electronic Signature(s) Signed: 02/24/2021 2:43:13 PM By: Kalman Shan DO Entered By: Kalman Shan on 02/24/2021 14:42:10 -------------------------------------------------------------------------------- HxROS Details Patient Name: Date of Service: Kayla [name], Kayla A. 02/24/2021 12:45 PM Medical Record Number: [id number] Patient Account Number: [id number] Date of Birth/Sex: Treating RN: 24-Feb-1941 (80 y.o. [name] Primary Care Provider: Jill Alexanders Other Clinician: Referring Provider: Treating Provider/Extender: [name] in Treatment: 2 Information Obtained From Patient Eyes Medical History: Negative for: Cataracts; Glaucoma; Optic Neuritis Ear/Nose/Mouth/Throat Medical History: Negative for: Chronic sinus problems/congestion; Middle ear problems Hematologic/Lymphatic Medical History: Positive for: Lymphedema Negative for: Anemia; Hemophilia; Human Immunodeficiency Virus; Sickle Cell Disease Respiratory Medical History: Negative for: Aspiration; Asthma; Chronic Obstructive Pulmonary Disease (COPD); Pneumothorax; Sleep Apnea; Tuberculosis Cardiovascular Medical History: Positive for: Hypertension Negative for: Angina; Arrhythmia; Congestive Heart Failure; Coronary Artery Disease; Deep Vein Thrombosis; Hypotension; Myocardial Infarction; Peripheral Arterial Disease; Peripheral Venous Disease; Phlebitis; Vasculitis Past Medical History Notes: hyperlipidemia Gastrointestinal Medical History: Negative for: Cirrhosis ; Colitis; Crohns; Hepatitis A; Hepatitis B; Hepatitis C Past Medical History Notes: GERD Endocrine Medical History: Positive for: Type II Diabetes Negative for: Type I Diabetes Genitourinary Medical History: Negative for: End Stage Renal  Disease Immunological Medical History: Negative for: Lupus Erythematosus; Raynauds; Scleroderma Integumentary (Skin) Medical History: Negative for: History of Burn Musculoskeletal Medical History: Positive for: Rheumatoid Arthritis; Osteoarthritis Negative for: Gout; Osteomyelitis Neurologic Medical History: Positive for: Neuropathy Negative for: Dementia; Quadriplegia; Paraplegia; Seizure Disorder Oncologic Medical History: Negative for: Received Chemotherapy; Received Radiation Immunizations Pneumococcal Vaccine: Received Pneumococcal Vaccination: Yes Received Pneumococcal Vaccination On or After 60th Birthday: No Implantable Devices None Family and Social History Unknown History: Yes; Never smoker; Marital Status - Widowed; Alcohol Use: Never; Drug Use: No History; Caffeine Use: Moderate; Financial Concerns: No; Food, Clothing or Shelter Needs: No; Support System Lacking: No; Transportation Concerns: No Electronic Signature(s) Signed: 02/24/2021 2:43:13 PM By: Kalman Shan DO Signed: 02/24/2021 5:49:27 PM By: Deon Pilling Entered By: Kalman Shan on 02/24/2021 14:37:17 -------------------------------------------------------------------------------- SuperBill Details Patient Name: Date of Service: Kayla [name], Kayla A. 02/24/2021 Medical Record Number: [id number] Patient Account Number: [id number] Date of Birth/Sex: Treating RN: [birth year] (80 y.o. [name] Primary Care Provider: Jill Alexanders Other Clinician: Referring Provider: Treating Provider/Extender: [name] in Treatment: 2 Diagnosis Coding ICD-10 Codes Code Description [phone] Non-pressure chronic ulcer of other part of left lower leg with unspecified severity I89.0 Lymphedema, not elsewhere classified I87.2 Venous insufficiency (chronic) (peripheral) E11.9 Type 2 diabetes mellitus without complications Facility Procedures CPT4 Code: [id number] Description: [id number] -  [facility] W/O ANES  NON SELECT Modifier: Quantity: 1 Physician Procedures : CPT4 Code Description Modifier [id number] - WC PHYS LEVEL 3 - EST PT ICD-10 Diagnosis Description L97.829 Non-pressure chronic ulcer of other part of left lower leg with unspecified severity I89.0 Lymphedema, not elsewhere classified I87.2 Venous  insufficiency (chronic) (peripheral) E11.9 Type 2 diabetes mellitus without complications Quantity: 1 Electronic Signature(s) Signed: 02/24/2021 2:43:13 PM By: Kalman Shan DO Entered By: Kalman Shan on 02/24/2021 14:42:26

## 2021-02-25 ENCOUNTER — Ambulatory Visit: Payer: Medicare PPO | Admitting: Podiatry

## 2021-02-25 NOTE — Progress Notes (Signed)
[name], [name] ([id number]) Visit Report for 02/24/2021 Arrival Information Details Patient Name: Date of Service: Kayla Kayla [name], Kayla Kayla [name] 02/24/2021 12:45 PM Medical Record Number: [id number] Patient Account Number: [id number] Date of Birth/Sex: Treating RN: Kayla Kayla [name] Primary Care Kayla Kayla [name]: Kayla Kayla [name] Other Clinician: Referring Kayla Kayla [name]: Treating Kayla Kayla [name]: Kayla Kayla [name] in Treatment: 2 Visit Information History Since Last Visit Added or deleted any medications: No Patient Arrived: Wheel Chair Any new allergies or adverse reactions: No Arrival Time: 12:34 Had Kayla [name] fall or experienced change in No Accompanied By: self activities of daily living that Kayla affect Transfer Assistance: None risk of falls: Patient Identification Verified: Yes Signs or symptoms of abuse/neglect since last visito No Secondary Verification Process Completed: Yes Hospitalized since last visit: No Patient Requires Transmission-Based Precautions: No Implantable device outside of the clinic excluding No Patient Has Alerts: Yes cellular tissue based products placed in the center Patient Alerts: Patient on Blood Thinner since last visit: VVS ABI 6/27 R=0.94 Has Dressing in Place as Prescribed: Yes VVS ABI 6/27 L=Red Jacket Has Compression in Place as Prescribed: Yes Pain Present Now: Yes Electronic Signature(s) Signed: 02/24/2021 5:27:01 PM By: Baruch Gouty RN, BSN Entered By: Baruch Gouty on 02/24/2021 12:47:10 -------------------------------------------------------------------------------- Encounter Discharge Information Details Patient Name: Date of Service: Kayla Kayla [name], Kayla Kayla [name]. 02/24/2021 12:45 PM Medical Record Number: [id number] Patient Account Number: [id number] Date of Birth/Sex: Treating RN: 05/25/1941 (80 y.o. Kayla Kayla [name]: Kayla Kayla [name] Other Clinician: Referring Betsie Peckman: Treating Nik Gorrell/Extender: Kayla Kayla [name] in Treatment: 2 Encounter Discharge Information Items Post Procedure Vitals Discharge Condition: Stable Temperature (F): 98.6 Ambulatory Status: Wheelchair Pulse (bpm): 91 Discharge Destination: Home Respiratory Rate (breaths/min): 18 Transportation: Private Auto Blood Pressure (mmHg): 157/70 Accompanied By: self Schedule Follow-up Appointment: Yes Clinical Summary of Care: Patient Declined Electronic Signature(s) Signed: 02/25/2021 2:27:43 PM By: Baruch Gouty RN, BSN Entered By: Baruch Gouty on 02/24/2021 17:33:04 -------------------------------------------------------------------------------- Lower Extremity Assessment Details Patient Name: Date of Service: Kayla Kayla [name], Kayla [facility] Kayla [name]. 02/24/2021 12:45 PM Medical Record Number: [id number] Patient Account Number: [id number] Date of Birth/Sex: Treating RN: 22-Aug-1940 (80 y.o. Kayla Kayla [name] Primary Care Margy Sumler: Kayla Kayla [name] Other Clinician: Referring Amya Hlad: Treating Shera Laubach/Extender: Kayla Kayla [name] in Treatment: 2 Edema Assessment Assessed: [Left: No] [Right: No] Edema: [Left: Ye] [Right: s] Calf Left: Right: Point of Measurement: 31 cm From Medial Instep 48 cm Ankle Left: Right: Point of Measurement: 8 cm From Medial Instep 30.8 cm Electronic Signature(s) Signed: 02/24/2021 5:27:01 PM By: Baruch Gouty RN, BSN Entered By: Baruch Gouty on 02/24/2021 12:50:26 -------------------------------------------------------------------------------- Multi Wound Chart Details Patient Name: Date of Service: Kayla Kayla [name], Kayla Kayla [name]. 02/24/2021 12:45 PM Medical Record Number: [id number] Patient Account Number: [id number] Date of Birth/Sex: Treating RN: March 08, 1941 (80 y.o. [name], Meta.Reding Primary Care [place] Capers: Kayla Kayla [name] Other Clinician: Referring Jevaughn Degollado: Treating Ellianne Gowen/Extender: Kayla Kayla [name] in Treatment: 2 Vital Signs Height(in):  64 Capillary Blood Glucose(mg/dl): 108 Weight(lbs): 228 Pulse(bpm): 33 Body Mass Index(BMI): 71 Blood Pressure(mmHg): 157/70 Temperature(F): 98.6 Respiratory Rate(breaths/min): 18 Photos: [N/Kayla [name]:N/Kayla [name]] Left, Anterior Lower Leg Left, Posterior Lower Leg N/Kayla [name] Wound Location: Gradually Appeared Gradually Appeared N/Kayla [name] Wounding Event: Venous Leg Ulcer Venous Leg Ulcer N/Kayla [name] Primary Etiology: Lymphedema, Hypertension, Type II Lymphedema, Hypertension, Type II N/Kayla [name] Comorbid History: Diabetes, Rheumatoid Arthritis, Diabetes, Rheumatoid Arthritis, Osteoarthritis, Neuropathy Osteoarthritis, Neuropathy 01/27/2021 01/27/2021 N/Kayla [name] Date Acquired: 2 2 N/Kayla [name] Weeks of Treatment: Open Healed - Epithelialized N/Kayla [name] Wound Status: 0.5x1x0.1 0x0x0 N/Kayla [name] Measurements L x W x D (  cm) 0.393 0 N/Kayla [name] Kayla [name] (cm) : rea 0.039 0 N/Kayla [name] Volume (cm) : 95.50% 100.00% N/Kayla [name] % Reduction in Area: 95.60% 100.00% N/Kayla [name] % Reduction in Volume: Full Thickness Without Exposed Full Thickness Without Exposed N/Kayla [name] Classification: Support Structures Support Structures Small Small N/Kayla [name] Exudate Kayla [name] mount: Serosanguineous Serous N/Kayla [name] Exudate Type: red, brown amber N/Kayla [name] Exudate Color: Indistinct, nonvisible Distinct, outline attached N/Kayla [name] Wound Margin: Medium (34-66%) None Present (0%) N/Kayla [name] Granulation Kayla [name] mount: Pink N/Kayla [name] N/Kayla [name] Granulation Quality: None Present (0%) None Present (0%) N/Kayla [name] Necrotic Kayla [name] mount: Fat Layer (Subcutaneous Tissue): Yes Fascia: No N/Kayla [name] Exposed Structures: Fascia: No Fat Layer (Subcutaneous Tissue): No Tendon: No Tendon: No Muscle: No Muscle: No Joint: No Joint: No Bone: No Bone: No Limited to Skin Breakdown Medium (34-66%) Large (67-100%) N/Kayla [name] Epithelialization: Chemical/Enzymatic/Mechanical N/Kayla [name] N/Kayla [name] Debridement: N/Kayla [name] N/Kayla [name] N/Kayla [name] Instrument: None N/Kayla [name] N/Kayla [name] Bleeding: Debridement Treatment Response: Procedure was tolerated well N/Kayla [name] N/Kayla [name] Post Debridement Measurements L x 0.5x1x0.1 N/Kayla [name] N/Kayla [name] W x D (cm) 0.039 N/Kayla [name] N/Kayla [name] Post  Debridement Volume: (cm) Debridement N/Kayla [name] N/Kayla [name] Procedures Performed: Treatment Notes Electronic Signature(s) Signed: 02/24/2021 2:43:13 PM By: Kalman Shan DO Signed: 02/24/2021 5:49:27 PM By: Deon Pilling Entered By: Kalman Shan on 02/24/2021 14:36:10 -------------------------------------------------------------------------------- Multi-Disciplinary Care Plan Details Patient Name: Date of Service: [facility] Kayla [name], Kayla [facility] Kayla [name]. 02/24/2021 12:45 PM Medical Record Number: [id number] Patient Account Number: [id number] Date of Birth/Sex: Treating RN: 11/02/1940 (80 y.o. [name] Primary Care Leili Eskenazi: Kayla Kayla [name] Other Clinician: Referring Rory Montel: Treating Charmine Bockrath/Extender: Kayla Kayla [name] in Treatment: 2 Active Inactive Venous Leg Ulcer Nursing Diagnoses: Actual venous Insuffiency (use after diagnosis is confirmed) Goals: Patient will maintain optimal edema control Date Initiated: 02/10/2021 Target Resolution Date: 04/01/2021 Goal Status: Active Patient/caregiver will verbalize understanding of disease process and disease management Date Initiated: 02/10/2021 Target Resolution Date: 03/24/2021 Goal Status: Active Interventions: Assess peripheral edema status every visit. Compression as ordered Treatment Activities: Therapeutic compression applied : 02/10/2021 Notes: Wound/Skin Impairment Nursing Diagnoses: Impaired tissue integrity Goals: Ulcer/skin breakdown will have Kayla [name] volume reduction of 30% by week 4 Date Initiated: 02/10/2021 Target Resolution Date: 03/17/2021 Goal Status: Active Interventions: Assess patient/caregiver ability to obtain necessary supplies Assess patient/caregiver ability to perform ulcer/skin care regimen upon admission and as needed Assess ulceration(s) every visit Provide education on ulcer and skin care Treatment Activities: Skin care regimen initiated : 02/10/2021 Topical wound management initiated :  02/10/2021 Notes: Electronic Signature(s) Signed: 02/24/2021 5:49:27 PM By: Deon Pilling Entered By: Deon Pilling on 02/24/2021 12:54:21 -------------------------------------------------------------------------------- Pain Assessment Details Patient Name: Date of Service: Kayla Kayla [name], Kayla Kayla [name]. 02/24/2021 12:45 PM Medical Record Number: [id number] Patient Account Number: [id number] Date of Birth/Sex: Treating RN: 07-Sep-1940 (80 y.o. Kayla Kayla [name] Primary Care Alfonse Garringer: Kayla Kayla [name] Other Clinician: Referring Jowell Bossi: Treating Rion Schnitzer/Extender: Kayla Kayla [name] in Treatment: 2 Active Problems Location of Pain Severity and Description of Pain Patient Has Paino Yes Site Locations Pain Location: Pain in Ulcers With Dressing Change: Yes Duration of the Pain. Constant / Intermittento Intermittent Rate the pain. Current Pain Level: 0 Worst Pain Level: 7 Least Pain Level: 0 Character of Pain Describe the Pain: Aching, Sharp, Shooting Pain Management and Medication Current Pain Management: Medication: Yes Cold Application: No Rest: Yes Massage: No Activity: No T.E.N.S.: No Heat Application: No Leg drop or elevation: No Other: time Is the Current Pain Management Adequate: Inadequate How does your wound impact your activities of daily livingo Sleep: Yes Bathing: No Appetite: No Relationship With Others: No Bladder Continence: No Emotions: No Bowel Continence:  No Work: No Toileting: No Drive: No Dressing: No Hobbies: No Engineer, maintenance) Signed: 02/24/2021 5:27:01 PM By: Baruch Gouty RN, BSN Entered By: Baruch Gouty on 02/24/2021 12:49:15 -------------------------------------------------------------------------------- Patient/Caregiver Education Details Patient Name: Date of Service: Kayla Kayla [name], Kayla Kayla [name] 7/28/2022andnbsp12:45 PM Medical Record Number: [id number] Patient Account Number: [id number] Date of Birth/Gender: Treating  RN: 12/01/1940 (80 y.o. [name] Primary Care Physician: Kayla Kayla [name] Other Clinician: Referring Physician: Treating Physician/Extender: Kayla Kayla [name] in Treatment: 2 Education Assessment Education Provided To: Patient Education Topics Provided Wound/Skin Impairment: Handouts: Caring for Your Ulcer Methods: Explain/Verbal Responses: Reinforcements needed Electronic Signature(s) Signed: 02/24/2021 5:49:27 PM By: Deon Pilling Entered By: Deon Pilling on 02/24/2021 12:54:31 -------------------------------------------------------------------------------- Wound Assessment Details Patient Name: Date of Service: Kayla Kayla [name], Jerilee Kayla [name]. 02/24/2021 12:45 PM Medical Record Number: [id number] Patient Account Number: [id number] Date of Birth/Sex: Treating RN: [birth year] (80 y.o. Kayla Kayla [name], Kayla [name] Primary Care Zein Helbing: Kayla Kayla [name] Other Clinician: Referring Rhyder Bratz: Treating Clelia Trabucco/Extender: Kayla Kayla [name] in Treatment: 2 Wound Status Wound Number: 2 Primary Venous Leg Ulcer Etiology: Wound Location: Left, Anterior Lower Leg Wound Open Wounding Event: Gradually Appeared Status: Date Acquired: 01/27/2021 Date Acquired: 01/27/2021 Comorbid Lymphedema, Hypertension, Type II Diabetes, Rheumatoid Weeks Of Treatment: 2 History: Arthritis, Osteoarthritis, Neuropathy Clustered Wound: No Photos Wound Measurements Length: (cm) 0.5 Width: (cm) 1 Depth: (cm) 0.1 Area: (cm) 0.393 Volume: (cm) 0.039 % Reduction in Area: 95.5% % Reduction in Volume: 95.6% Epithelialization: Medium (34-66%) Tunneling: No Undermining: No Wound Description Classification: Full Thickness Without Exposed Support Structures Wound Margin: Indistinct, nonvisible Exudate Amount: Small Exudate Type: Serosanguineous Exudate Color: red, brown Foul Odor After Cleansing: No Slough/Fibrino No Wound Bed Granulation Amount: Medium (34-66%) Exposed  Structure Granulation Quality: Pink Fascia Exposed: No Necrotic Amount: None Present (0%) Fat Layer (Subcutaneous Tissue) Exposed: Yes Tendon Exposed: No Muscle Exposed: No Joint Exposed: No Bone Exposed: No Treatment Notes Wound #2 (Lower Leg) Wound Laterality: Left, Anterior Cleanser Soap and Water Discharge Instruction: Kayla shower and wash wound with dial antibacterial soap and water prior to dressing change. Wound Cleanser Discharge Instruction: Cleanse the wound with wound cleanser prior to applying Kayla [name] clean dressing using gauze sponges, not tissue or cotton balls. Peri-Wound Care Triamcinolone 15 (g) Discharge Instruction: in clinic only. Use triamcinolone 15 (g) as directed Sween Lotion (Moisturizing lotion) Discharge Instruction: Apply moisturizing lotion as directed Topical Primary Dressing Hydrofera Blue Ready Foam, 4x5 in Discharge Instruction: Apply to wound bed as instructed Santyl Ointment Discharge Instruction: Apply nickel thick amount to wound bed as instructed Secondary Dressing Woven Gauze Sponge, Non-Sterile 4x4 in Discharge Instruction: Apply over primary dressing as directed. ABD Pad, 5x9 Discharge Instruction: Apply over primary dressing as directed. Secured With [employer], 4.5x3.1 (in/yd) Discharge Instruction: Secure with Kerlix as directed. 24M Medipore H Soft Cloth Surgical T 4 x 2 (in/yd) ape Discharge Instruction: Secure dressing with tape as directed. Compression Wrap Compression Stockings Add-Ons Electronic Signature(s) Signed: 02/24/2021 5:27:01 PM By: Baruch Gouty RN, BSN Entered By: Baruch Gouty on 02/24/2021 12:55:02 -------------------------------------------------------------------------------- Wound Assessment Details Patient Name: Date of Service: Kayla Kayla [name], Kayla Kayla [name]. 02/24/2021 12:45 PM Medical Record Number: [id number] Patient Account Number: [id number] Date of Birth/Sex: Treating RN: [birth year] (80 y.o. [name] Primary Care Shella Lahman: Kayla Kayla [name] Other Clinician: Referring Trek Kimball: Treating Traylen Eckels/Extender: Kayla Kayla [name] in Treatment: 2 Wound Status Wound Number: 3 Primary Venous Leg Ulcer Etiology: Wound Location: Left, Posterior Lower Leg Wound Healed - Epithelialized Wounding Event: Gradually Appeared Status:  Date Acquired: 01/27/2021 Comorbid Lymphedema, Hypertension, Type II Diabetes, Rheumatoid Weeks Of Treatment: 2 History: Arthritis, Osteoarthritis, Neuropathy Clustered Wound: No Photos Wound Measurements Length: (cm) Width: (cm) Depth: (cm) Area: (cm) Volume: (cm) 0 % Reduction in Area: 100% 0 % Reduction in Volume: 100% 0 Epithelialization: Large (67-100%) 0 Tunneling: No 0 Undermining: No Wound Description Classification: Full Thickness Without Exposed Support Structures Wound Margin: Distinct, outline attached Exudate Amount: Small Exudate Type: Serous Exudate Color: amber Foul Odor After Cleansing: No Slough/Fibrino No Wound Bed Granulation Amount: None Present (0%) Exposed Structure Necrotic Amount: None Present (0%) Fascia Exposed: No Fat Layer (Subcutaneous Tissue) Exposed: No Tendon Exposed: No Muscle Exposed: No Joint Exposed: No Bone Exposed: No Limited to Skin Breakdown Treatment Notes Wound #3 (Lower Leg) Wound Laterality: Left, Posterior Cleanser Peri-Wound Care Topical Primary Dressing Secondary Dressing Secured With Compression Wrap Compression Stockings Add-Ons Electronic Signature(s) Signed: 02/24/2021 5:49:27 PM By: Deon Pilling Entered By: Deon Pilling on 02/24/2021 13:33:55 -------------------------------------------------------------------------------- Vitals Details Patient Name: Date of Service: Kayla Kayla [name], Kayla Kayla [name]. 02/24/2021 12:45 PM Medical Record Number: [id number] Patient Account Number: [id number] Date of Birth/Sex: Treating RN: 1941/04/03 (80 y.o. Kayla Kayla [name] Primary Care  Perpetua Elling: Kayla Kayla [name] Other Clinician: Referring Sears Oran: Treating Fuller Makin/Extender: Kayla Kayla [name] in Treatment: 2 Vital Signs Time Taken: 12:47 Temperature (F): 98.6 Height (in): 64 Pulse (bpm): 91 Source: Stated Respiratory Rate (breaths/min): 18 Weight (lbs): 228 Blood Pressure (mmHg): 157/70 Source: Stated Capillary Blood Glucose (mg/dl): 108 Body Mass Index (BMI): 39.1 Reference Range: 80 - 120 mg / dl Notes glucose per pt report this am Electronic Signature(s) Signed: 02/24/2021 5:27:01 PM By: Baruch Gouty RN, BSN Entered By: Baruch Gouty on 02/24/2021 12:48:06

## 2021-03-03 ENCOUNTER — Other Ambulatory Visit: Payer: Self-pay

## 2021-03-03 ENCOUNTER — Encounter (HOSPITAL_BASED_OUTPATIENT_CLINIC_OR_DEPARTMENT_OTHER): Payer: Medicare PPO | Attending: Internal Medicine | Admitting: Internal Medicine

## 2021-03-03 DIAGNOSIS — I10 Essential (primary) hypertension: Secondary | ICD-10-CM | POA: Diagnosis present

## 2021-03-03 DIAGNOSIS — R61 Generalized hyperhidrosis: Secondary | ICD-10-CM | POA: Diagnosis not present

## 2021-03-03 DIAGNOSIS — L97829 Non-pressure chronic ulcer of other part of left lower leg with unspecified severity: Secondary | ICD-10-CM | POA: Diagnosis not present

## 2021-03-03 DIAGNOSIS — R5381 Other malaise: Secondary | ICD-10-CM | POA: Diagnosis not present

## 2021-03-03 DIAGNOSIS — Z90710 Acquired absence of both cervix and uterus: Secondary | ICD-10-CM | POA: Diagnosis not present

## 2021-03-03 DIAGNOSIS — R2681 Unsteadiness on feet: Secondary | ICD-10-CM | POA: Diagnosis not present

## 2021-03-03 DIAGNOSIS — I499 Cardiac arrhythmia, unspecified: Secondary | ICD-10-CM | POA: Diagnosis not present

## 2021-03-03 DIAGNOSIS — I959 Hypotension, unspecified: Secondary | ICD-10-CM | POA: Diagnosis present

## 2021-03-03 DIAGNOSIS — E669 Obesity, unspecified: Secondary | ICD-10-CM | POA: Diagnosis present

## 2021-03-03 DIAGNOSIS — I4892 Unspecified atrial flutter: Secondary | ICD-10-CM | POA: Diagnosis present

## 2021-03-03 DIAGNOSIS — G4489 Other headache syndrome: Secondary | ICD-10-CM | POA: Diagnosis not present

## 2021-03-03 DIAGNOSIS — R2689 Other abnormalities of gait and mobility: Secondary | ICD-10-CM | POA: Diagnosis not present

## 2021-03-03 DIAGNOSIS — Z9049 Acquired absence of other specified parts of digestive tract: Secondary | ICD-10-CM | POA: Diagnosis not present

## 2021-03-03 DIAGNOSIS — Z7401 Bed confinement status: Secondary | ICD-10-CM | POA: Diagnosis not present

## 2021-03-03 DIAGNOSIS — R5383 Other fatigue: Secondary | ICD-10-CM | POA: Diagnosis not present

## 2021-03-03 DIAGNOSIS — M19072 Primary osteoarthritis, left ankle and foot: Secondary | ICD-10-CM | POA: Diagnosis present

## 2021-03-03 DIAGNOSIS — I4891 Unspecified atrial fibrillation: Secondary | ICD-10-CM | POA: Diagnosis not present

## 2021-03-03 DIAGNOSIS — M6281 Muscle weakness (generalized): Secondary | ICD-10-CM | POA: Diagnosis not present

## 2021-03-03 DIAGNOSIS — I480 Paroxysmal atrial fibrillation: Secondary | ICD-10-CM | POA: Diagnosis present

## 2021-03-03 DIAGNOSIS — J80 Acute respiratory distress syndrome: Secondary | ICD-10-CM | POA: Diagnosis not present

## 2021-03-03 DIAGNOSIS — I214 Non-ST elevation (NSTEMI) myocardial infarction: Secondary | ICD-10-CM | POA: Diagnosis present

## 2021-03-03 DIAGNOSIS — I2510 Atherosclerotic heart disease of native coronary artery without angina pectoris: Secondary | ICD-10-CM | POA: Diagnosis present

## 2021-03-03 DIAGNOSIS — R000 Tachycardia, unspecified: Secondary | ICD-10-CM | POA: Diagnosis not present

## 2021-03-03 DIAGNOSIS — J811 Chronic pulmonary edema: Secondary | ICD-10-CM | POA: Diagnosis not present

## 2021-03-03 DIAGNOSIS — E8809 Other disorders of plasma-protein metabolism, not elsewhere classified: Secondary | ICD-10-CM | POA: Diagnosis not present

## 2021-03-03 DIAGNOSIS — R002 Palpitations: Secondary | ICD-10-CM | POA: Diagnosis present

## 2021-03-03 DIAGNOSIS — Z6838 Body mass index (BMI) 38.0-38.9, adult: Secondary | ICD-10-CM | POA: Diagnosis not present

## 2021-03-03 DIAGNOSIS — E119 Type 2 diabetes mellitus without complications: Secondary | ICD-10-CM

## 2021-03-03 DIAGNOSIS — I890 Lymphedema, not elsewhere classified: Secondary | ICD-10-CM

## 2021-03-03 DIAGNOSIS — E785 Hyperlipidemia, unspecified: Secondary | ICD-10-CM | POA: Diagnosis present

## 2021-03-03 DIAGNOSIS — I248 Other forms of acute ischemic heart disease: Secondary | ICD-10-CM | POA: Diagnosis present

## 2021-03-03 DIAGNOSIS — I700 Atherosclerosis of aorta: Secondary | ICD-10-CM | POA: Diagnosis present

## 2021-03-03 DIAGNOSIS — I4902 Ventricular flutter: Secondary | ICD-10-CM | POA: Diagnosis not present

## 2021-03-03 DIAGNOSIS — R41841 Cognitive communication deficit: Secondary | ICD-10-CM | POA: Diagnosis not present

## 2021-03-03 DIAGNOSIS — N179 Acute kidney failure, unspecified: Secondary | ICD-10-CM | POA: Diagnosis present

## 2021-03-03 DIAGNOSIS — R778 Other specified abnormalities of plasma proteins: Secondary | ICD-10-CM | POA: Diagnosis not present

## 2021-03-03 DIAGNOSIS — Z79899 Other long term (current) drug therapy: Secondary | ICD-10-CM | POA: Diagnosis not present

## 2021-03-03 DIAGNOSIS — Z86711 Personal history of pulmonary embolism: Secondary | ICD-10-CM | POA: Diagnosis not present

## 2021-03-03 DIAGNOSIS — Z20822 Contact with and (suspected) exposure to covid-19: Secondary | ICD-10-CM | POA: Diagnosis present

## 2021-03-03 DIAGNOSIS — M10072 Idiopathic gout, left ankle and foot: Secondary | ICD-10-CM | POA: Diagnosis not present

## 2021-03-03 DIAGNOSIS — R7989 Other specified abnormal findings of blood chemistry: Secondary | ICD-10-CM | POA: Diagnosis not present

## 2021-03-03 DIAGNOSIS — M25572 Pain in left ankle and joints of left foot: Secondary | ICD-10-CM | POA: Diagnosis not present

## 2021-03-03 DIAGNOSIS — E11618 Type 2 diabetes mellitus with other diabetic arthropathy: Secondary | ICD-10-CM | POA: Diagnosis not present

## 2021-03-03 DIAGNOSIS — I872 Venous insufficiency (chronic) (peripheral): Secondary | ICD-10-CM | POA: Diagnosis present

## 2021-03-03 DIAGNOSIS — R600 Localized edema: Secondary | ICD-10-CM | POA: Diagnosis present

## 2021-03-03 DIAGNOSIS — M109 Gout, unspecified: Secondary | ICD-10-CM | POA: Diagnosis present

## 2021-03-03 DIAGNOSIS — J9811 Atelectasis: Secondary | ICD-10-CM | POA: Diagnosis not present

## 2021-03-03 DIAGNOSIS — R079 Chest pain, unspecified: Secondary | ICD-10-CM | POA: Diagnosis not present

## 2021-03-04 ENCOUNTER — Inpatient Hospital Stay (HOSPITAL_COMMUNITY)
Admission: EM | Admit: 2021-03-04 | Discharge: 2021-03-15 | DRG: 281 | Disposition: A | Discharge status: Skilled Nursing Facility | Payer: Medicare PPO | Attending: Internal Medicine | Admitting: Internal Medicine

## 2021-03-04 DIAGNOSIS — Z7984 Long term (current) use of oral hypoglycemic drugs: Secondary | ICD-10-CM

## 2021-03-04 DIAGNOSIS — E785 Hyperlipidemia, unspecified: Secondary | ICD-10-CM | POA: Diagnosis present

## 2021-03-04 DIAGNOSIS — E8809 Other disorders of plasma-protein metabolism, not elsewhere classified: Secondary | ICD-10-CM | POA: Diagnosis not present

## 2021-03-04 DIAGNOSIS — R55 Syncope and collapse: Secondary | ICD-10-CM | POA: Diagnosis not present

## 2021-03-04 DIAGNOSIS — R778 Other specified abnormalities of plasma proteins: Secondary | ICD-10-CM | POA: Diagnosis present

## 2021-03-04 DIAGNOSIS — I959 Hypotension, unspecified: Secondary | ICD-10-CM | POA: Diagnosis present

## 2021-03-04 DIAGNOSIS — I480 Paroxysmal atrial fibrillation: Secondary | ICD-10-CM | POA: Diagnosis present

## 2021-03-04 DIAGNOSIS — Z7982 Long term (current) use of aspirin: Secondary | ICD-10-CM

## 2021-03-04 DIAGNOSIS — I10 Essential (primary) hypertension: Secondary | ICD-10-CM | POA: Diagnosis present

## 2021-03-04 DIAGNOSIS — Z79899 Other long term (current) drug therapy: Secondary | ICD-10-CM

## 2021-03-04 DIAGNOSIS — Z20822 Contact with and (suspected) exposure to covid-19: Secondary | ICD-10-CM | POA: Diagnosis present

## 2021-03-04 DIAGNOSIS — E669 Obesity, unspecified: Secondary | ICD-10-CM | POA: Diagnosis present

## 2021-03-04 DIAGNOSIS — Z90710 Acquired absence of both cervix and uterus: Secondary | ICD-10-CM

## 2021-03-04 DIAGNOSIS — Z9049 Acquired absence of other specified parts of digestive tract: Secondary | ICD-10-CM

## 2021-03-04 DIAGNOSIS — Z6838 Body mass index (BMI) 38.0-38.9, adult: Secondary | ICD-10-CM

## 2021-03-04 DIAGNOSIS — E119 Type 2 diabetes mellitus without complications: Secondary | ICD-10-CM

## 2021-03-04 DIAGNOSIS — I700 Atherosclerosis of aorta: Secondary | ICD-10-CM | POA: Diagnosis present

## 2021-03-04 DIAGNOSIS — I4892 Unspecified atrial flutter: Principal | ICD-10-CM | POA: Diagnosis present

## 2021-03-04 DIAGNOSIS — I248 Other forms of acute ischemic heart disease: Secondary | ICD-10-CM | POA: Diagnosis present

## 2021-03-04 DIAGNOSIS — I214 Non-ST elevation (NSTEMI) myocardial infarction: Secondary | ICD-10-CM | POA: Diagnosis present

## 2021-03-04 DIAGNOSIS — M109 Gout, unspecified: Secondary | ICD-10-CM | POA: Diagnosis present

## 2021-03-04 DIAGNOSIS — R52 Pain, unspecified: Secondary | ICD-10-CM

## 2021-03-04 DIAGNOSIS — I872 Venous insufficiency (chronic) (peripheral): Secondary | ICD-10-CM | POA: Diagnosis present

## 2021-03-04 DIAGNOSIS — Z86711 Personal history of pulmonary embolism: Secondary | ICD-10-CM

## 2021-03-04 DIAGNOSIS — R002 Palpitations: Secondary | ICD-10-CM | POA: Diagnosis present

## 2021-03-04 DIAGNOSIS — N179 Acute kidney failure, unspecified: Secondary | ICD-10-CM

## 2021-03-04 DIAGNOSIS — R600 Localized edema: Secondary | ICD-10-CM | POA: Diagnosis present

## 2021-03-04 DIAGNOSIS — I2510 Atherosclerotic heart disease of native coronary artery without angina pectoris: Secondary | ICD-10-CM | POA: Diagnosis present

## 2021-03-04 DIAGNOSIS — M19072 Primary osteoarthritis, left ankle and foot: Secondary | ICD-10-CM | POA: Diagnosis present

## 2021-03-04 MED ORDER — SODIUM CHLORIDE 0.9 % IV BOLUS
500.0000 mL | Freq: Once | INTRAVENOUS | Status: AC
  Administered 2021-03-05: 500 mL via INTRAVENOUS

## 2021-03-04 MED ORDER — ETOMIDATE 2 MG/ML IV SOLN
10.0000 mg | Freq: Once | INTRAVENOUS | Status: AC
  Administered 2021-03-05: 10 mg via INTRAVENOUS
  Filled 2021-03-04: qty 10

## 2021-03-04 NOTE — Progress Notes (Signed)
SOFIE, GUINYARD ([id number]) Visit Report for 03/03/2021 Chief Complaint Document Details Patient Name: Date of Service: Kayla [name], [name] 03/03/2021 3:00 PM Medical Record Number: [id number] Patient Account Number: [id number] Date of Birth/Sex: Treating RN: 1941/07/28 (80 y.o. Kayla [name] Primary Care Provider: Jill [name] Other Clinician: Referring Provider: Treating Provider/Extender: Kayla [name] in Treatment: 3 Information Obtained from: Patient Chief Complaint left lower extremity wounds Electronic Signature(s) Signed: 03/03/2021 5:31:46 PM By: Kayla Shan DO Entered By: Kayla [name] on 03/03/2021 17:22:41 -------------------------------------------------------------------------------- HPI Details Patient Name: Date of Service: Kayla [name], Kayla A. 03/03/2021 3:00 PM Medical Record Number: [id number] Patient Account Number: [id number] Date of Birth/Sex: Treating RN: [birth year] (80 y.o. Kayla [name] Primary Care Provider: Jill [name] Other Clinician: Referring Provider: Treating Provider/Extender: Kayla [name] in Treatment: 3 History of Present Illness HPI Description: Admission 5/20 Ms. Kawanis [name] is an 80 year old female with a past medical history of type 2 diabetes on oral agents, chronic venous insufficiency and lymphedema that presents to our clinic for bilateral lower extremity swelling with a small open wound to the right lower extremity. She states that her swelling has increased over the past year. She does not use compression stockings although she does own some. She states these are too hard to get on. She tells me she does not have a history of open wounds however in the EMR it is noted that she has had several cases of open wounds to her legs. She reports pain to shin of her left lower extremity. She is currently on doxycycline for cellulitis of her calf. She states that the pain to her shin has been  present for the past 6 months and has not gotten better. There are no open wounds to the left lower extremity. 5/27; patient presents for 1 week follow-up. Her wound has healed on her right lower extremity. She has no open wounds to her left lower extremity. She had an ultrasound of an suspicious area last week on the left lower extremity that could possibly have been an abscess. She received her juxta light compression and has it today. She denies signs of infection. Admission 7/14 Ms. Kayla [name] presents for a left lower extremity wound That occurred 2 weeks ago. She was evaluated by vein and vascular and she states that they are ordering her lymphedema pumps. She is currently keeping the areas covered. She denies signs of infection. She reports tenderness to the area. 7/21; patient presents for 1 week follow-up. She had a Kerlix/Coban wrap placed at last clinic visit that she rolled down a fourth of the way due to itching. She no longer wants the wrap in place. She also declines debridement today. She would like home health to help with wound care dressings. She states she has lymphedema pumps coming in the mail however she has no way of putting these on her taking these off. 7/28; patient presents for 1 week follow-up. She has not used any compression therapy to her legs. She has pumps at home however has not been able to put them on. She has not heard from home health. She has kept the dressing on since last clinic visit. She denies any issues. She denies signs of infection. 8/4; patient presents for 1 week follow-up. She has been using Hydrofera Blue to the wound. She states that the wound has healed. She is also started using her lymphedema pumps. Electronic Signature(s) Signed: 03/03/2021 5:31:46 PM By: Kayla Shan DO Signed: 03/03/2021 5:31:46 PM  By: Kayla Shan DO Entered By: Kayla [name] on 03/03/2021  17:23:51 -------------------------------------------------------------------------------- Physical Exam Details Patient Name: Date of Service: Kayla [name], Kayla A. 03/03/2021 3:00 PM Medical Record Number: [id number] Patient Account Number: [id number] Date of Birth/Sex: Treating RN: May 27, 1941 (80 y.o. Kayla [name] Primary Care Provider: Jill [name] Other Clinician: Referring Provider: Treating Provider/Extender: Kayla [name] in Treatment: 3 Constitutional respirations regular, non-labored and within target range for patient.. Cardiovascular 2+ dorsalis pedis/posterior tibialis pulses. Psychiatric pleasant and cooperative. Notes Left lower extremity: Epithelialization to previous wound site. Good edema control. Surrounding skin intact without signs of infection. Electronic Signature(s) Signed: 03/03/2021 5:31:46 PM By: Kayla Shan DO Entered By: Kayla [name] on 03/03/2021 17:24:23 -------------------------------------------------------------------------------- Physician Orders Details Patient Name: Date of Service: Kayla [name], [place]. 03/03/2021 3:00 PM Medical Record Number: [id number] Patient Account Number: [id number] Date of Birth/Sex: Treating RN: 05/31/1941 (80 y.o. Kayla [name] Primary Care Provider: Jill [name] Other Clinician: Referring Provider: Treating Provider/Extender: Kayla [name] in Treatment: 3 Verbal / Phone Orders: No Diagnosis Coding ICD-10 Coding Code Description [phone] Non-pressure chronic ulcer of other part of left lower leg with unspecified severity I89.0 Lymphedema, not elsewhere classified I87.2 Venous insufficiency (chronic) (peripheral) E11.9 Type 2 diabetes mellitus without complications Discharge From [facility] Services Discharge from [place] - call if any future wound care needs. Bathing/ Shower/ Hygiene May shower and wash wound with soap and water. Edema Control - Lymphedema /  SCD / Other Lymphedema Pumps. Use Lymphedema pumps on leg(s) 2-3 times a day for 45-60 minutes. If wearing any wraps or hose, do not remove them. Continue exercising as instructed. - use 2 times a day. Elevate legs to the level of the heart or above for 30 minutes daily and/or when sitting, a frequency of: - throughout the day. Avoid standing for long periods of time. Exercise regularly Compression stocking or Garment 20-30 mm/Hg pressure to: - wear juxtalites compression garment to both legs. apply in the morning and remove at night. Additional Orders / Instructions Follow Nutritious Diet Electronic Signature(s) Signed: 03/03/2021 5:31:46 PM By: Kayla Shan DO Entered By: Kayla [name] on 03/03/2021 17:24:36 -------------------------------------------------------------------------------- Problem List Details Patient Name: Date of Service: Kayla [name], Beat[place] A. 03/03/2021 3:00 PM Medical Record Number: [id number] Patient Account Number: [id number] Date of Birth/Sex: Treating RN: 14-Mar-1941 (80 y.o. Kayla [name] Primary Care Provider: Jill [name] Other Clinician: Referring Provider: Treating Provider/Extender: Kayla [name] in Treatment: 3 Active Problems ICD-10 Encounter Code Description Active Date MDM Diagnosis L97.829 Non-pressure chronic ulcer of other part of left lower leg with unspecified 02/10/2021 No Yes severity I89.0 Lymphedema, not elsewhere classified 02/10/2021 No Yes I87.2 Venous insufficiency (chronic) (peripheral) 02/10/2021 No Yes E11.9 Type 2 diabetes mellitus without complications 0000000 No Yes Inactive Problems Resolved Problems Electronic Signature(s) Signed: 03/03/2021 5:31:46 PM By: Kayla Shan DO Entered By: Kayla [name] on 03/03/2021 17:22:00 -------------------------------------------------------------------------------- Progress Note Details Patient Name: Date of Service: Kayla [name], [place]. 03/03/2021 3:00  PM Medical Record Number: [id number] Patient Account Number: [id number] Date of Birth/Sex: Treating RN: 1941/07/27 (80 y.o. Kayla [name] Primary Care Provider: Jill [name] Other Clinician: Referring Provider: Treating Provider/Extender: Kayla [name] in Treatment: 3 Subjective Chief Complaint Information obtained from Patient left lower extremity wounds History of Present Illness (HPI) Admission 5/20 Ms. Nayari Passero is an 80 year old female with a past medical history of type 2 diabetes on oral agents, chronic venous insufficiency and lymphedema that presents to our  clinic for bilateral lower extremity swelling with a small open wound to the right lower extremity. She states that her swelling has increased over the past year. She does not use compression stockings although she does own some. She states these are too hard to get on. She tells me she does not have a history of open wounds however in the EMR it is noted that she has had several cases of open wounds to her legs. She reports pain to shin of her left lower extremity. She is currently on doxycycline for cellulitis of her calf. She states that the pain to her shin has been present for the past 6 months and has not gotten better. There are no open wounds to the left lower extremity. 5/27; patient presents for 1 week follow-up. Her wound has healed on her right lower extremity. She has no open wounds to her left lower extremity. She had an ultrasound of an suspicious area last week on the left lower extremity that could possibly have been an abscess. She received her juxta light compression and has it today. She denies signs of infection. Admission 7/14 Ms. Branigan presents for a left lower extremity wound That occurred 2 weeks ago. She was evaluated by vein and vascular and she states that they are ordering her lymphedema pumps. She is currently keeping the areas covered. She denies signs of infection.  She reports tenderness to the area. 7/21; patient presents for 1 week follow-up. She had a Kerlix/Coban wrap placed at last clinic visit that she rolled down a fourth of the way due to itching. She no longer wants the wrap in place. She also declines debridement today. She would like home health to help with wound care dressings. She states she has lymphedema pumps coming in the mail however she has no way of putting these on her taking these off. 7/28; patient presents for 1 week follow-up. She has not used any compression therapy to her legs. She has pumps at home however has not been able to put them on. She has not heard from home health. She has kept the dressing on since last clinic visit. She denies any issues. She denies signs of infection. 8/4; patient presents for 1 week follow-up. She has been using Hydrofera Blue to the wound. She states that the wound has healed. She is also started using her lymphedema pumps. Patient History Information obtained from Patient. Family History Unknown History. Social History Never smoker, Marital Status - Widowed, Alcohol Use - Never, Drug Use - No History, Caffeine Use - Moderate. Medical History Eyes Denies history of Cataracts, Glaucoma, Optic Neuritis Ear/Nose/Mouth/Throat Denies history of Chronic sinus problems/congestion, Middle ear problems Hematologic/Lymphatic Patient has history of Lymphedema Denies history of Anemia, Hemophilia, Human Immunodeficiency Virus, Sickle Cell Disease Respiratory Denies history of Aspiration, Asthma, Chronic Obstructive Pulmonary Disease (COPD), Pneumothorax, Sleep Apnea, Tuberculosis Cardiovascular Patient has history of Hypertension Denies history of Angina, Arrhythmia, Congestive Heart Failure, Coronary Artery Disease, Deep Vein Thrombosis, Hypotension, Myocardial Infarction, Peripheral Arterial Disease, Peripheral Venous Disease, Phlebitis, Vasculitis Gastrointestinal Denies history of Cirrhosis ,  Colitis, Crohnoos, Hepatitis A, Hepatitis B, Hepatitis C Endocrine Patient has history of Type II Diabetes Denies history of Type I Diabetes Genitourinary Denies history of End Stage Renal Disease Immunological Denies history of Lupus Erythematosus, Raynaudoos, Scleroderma Integumentary (Skin) Denies history of History of Burn Musculoskeletal Patient has history of Rheumatoid Arthritis, Osteoarthritis Denies history of Gout, Osteomyelitis Neurologic Patient has history of Neuropathy Denies history of Dementia, Quadriplegia, Paraplegia,  Seizure Disorder Oncologic Denies history of Received Chemotherapy, Received Radiation Medical A Surgical History Notes nd Cardiovascular hyperlipidemia Gastrointestinal GERD Objective Constitutional respirations regular, non-labored and within target range for patient.. Vitals Time Taken: 3:42 PM, Height: 64 in, Source: Stated, Weight: 228 lbs, Source: Stated, BMI: 39.1, Temperature: 98.4 F, Pulse: 80 bpm, Respiratory Rate: 18 breaths/min, Blood Pressure: 170/71 mmHg, Capillary Blood Glucose: 110 mg/dl. General Notes: glucose per pt report this am Cardiovascular 2+ dorsalis pedis/posterior tibialis pulses. Psychiatric pleasant and cooperative. General Notes: Left lower extremity: Epithelialization to previous wound site. Good edema control. Surrounding skin intact without signs of infection. Integumentary (Hair, Skin) Wound #2 status is Open. Original cause of wound was Gradually Appeared. The date acquired was: 01/27/2021. The wound has been in treatment 3 weeks. The wound is located on the Left,Anterior Lower Leg. The wound measures 0cm length x 0cm width x 0cm depth; 0cm^2 area and 0cm^3 volume. There is no tunneling or undermining noted. There is a none present amount of drainage noted. There is no granulation within the wound bed. There is no necrotic tissue within the wound bed. Assessment Active Problems ICD-10 Non-pressure chronic  ulcer of other part of left lower leg with unspecified severity Lymphedema, not elsewhere classified Venous insufficiency (chronic) (peripheral) Type 2 diabetes mellitus without complications Patient has done well with Hydrofera Blue and her wound is closed. I recommended putting on her juxta lite compressions when she gets home. I also recommended using her lymphedema pumps 1oo2 times daily. She knows to follow-up as needed Plan Discharge From [facility] Services: Discharge from [place] - call if any future wound care needs. Bathing/ Shower/ Hygiene: May shower and wash wound with soap and water. Edema Control - Lymphedema / SCD / Other: Lymphedema Pumps. Use Lymphedema pumps on leg(s) 2-3 times a day for 45-60 minutes. If wearing any wraps or hose, do not remove them. Continue exercising as instructed. - use 2 times a day. Elevate legs to the level of the heart or above for 30 minutes daily and/or when sitting, a frequency of: - throughout the day. Avoid standing for long periods of time. Exercise regularly Compression stocking or Garment 20-30 mm/Hg pressure to: - wear juxtalites compression garment to both legs. apply in the morning and remove at night. Additional Orders / Instructions: Follow Nutritious Diet 1. Discharge from our clinic due to closed wounds 2. Use juxta light compressions daily 3. Continue lymphedema pumps 4. Follow-up as needed Electronic Signature(s) Signed: 03/03/2021 5:31:46 PM By: Kayla Shan DO Entered By: Kayla [name] on 03/03/2021 17:26:03 -------------------------------------------------------------------------------- HxROS Details Patient Name: Date of Service: Kayla [name], Annale[place] A. 03/03/2021 3:00 PM Medical Record Number: [id number] Patient Account Number: [id number] Date of Birth/Sex: Treating RN: November 16, 1940 (80 y.o. Kayla [name] Primary Care Provider: Jill [name] Other Clinician: Referring Provider: Treating Provider/Extender:  Kayla [name] in Treatment: 3 Information Obtained From Patient Eyes Medical History: Negative for: Cataracts; Glaucoma; Optic Neuritis Ear/Nose/Mouth/Throat Medical History: Negative for: Chronic sinus problems/congestion; Middle ear problems Hematologic/Lymphatic Medical History: Positive for: Lymphedema Negative for: Anemia; Hemophilia; Human Immunodeficiency Virus; Sickle Cell Disease Respiratory Medical History: Negative for: Aspiration; Asthma; Chronic Obstructive Pulmonary Disease (COPD); Pneumothorax; Sleep Apnea; Tuberculosis Cardiovascular Medical History: Positive for: Hypertension Negative for: Angina; Arrhythmia; Congestive Heart Failure; Coronary Artery Disease; Deep Vein Thrombosis; Hypotension; Myocardial Infarction; Peripheral Arterial Disease; Peripheral Venous Disease; Phlebitis; Vasculitis Past Medical History Notes: hyperlipidemia Gastrointestinal Medical History: Negative for: Cirrhosis ; Colitis; Crohns; Hepatitis A; Hepatitis B; Hepatitis C Past  Medical History Notes: GERD Endocrine Medical History: Positive for: Type II Diabetes Negative for: Type I Diabetes Genitourinary Medical History: Negative for: End Stage Renal Disease Immunological Medical History: Negative for: Lupus Erythematosus; Raynauds; Scleroderma Integumentary (Skin) Medical History: Negative for: History of Burn Musculoskeletal Medical History: Positive for: Rheumatoid Arthritis; Osteoarthritis Negative for: Gout; Osteomyelitis Neurologic Medical History: Positive for: Neuropathy Negative for: Dementia; Quadriplegia; Paraplegia; Seizure Disorder Oncologic Medical History: Negative for: Received Chemotherapy; Received Radiation Immunizations Pneumococcal Vaccine: Received Pneumococcal Vaccination: Yes Received Pneumococcal Vaccination On or After 60th Birthday: No Implantable Devices None Family and Social History Unknown History: Yes; Never  smoker; Marital Status - Widowed; Alcohol Use: Never; Drug Use: No History; Caffeine Use: Moderate; Financial Concerns: No; Food, Clothing or Shelter Needs: No; Support System Lacking: No; Transportation Concerns: No Electronic Signature(s) Signed: 03/03/2021 5:31:46 PM By: Kayla Shan DO Signed: 03/03/2021 6:14:36 PM By: Deon Pilling Entered By: Kayla [name] on 03/03/2021 17:23:58 -------------------------------------------------------------------------------- SuperBill Details Patient Name: Date of Service: Kayla [name], [place]. 03/03/2021 Medical Record Number: [id number] Patient Account Number: [id number] Date of Birth/Sex: Treating RN: [birth year] (80 y.o. Kayla [name] Primary Care Provider: Jill [name] Other Clinician: Referring Provider: Treating Provider/Extender: Kayla [name] in Treatment: 3 Diagnosis Coding ICD-10 Codes Code Description [phone] Non-pressure chronic ulcer of other part of left lower leg with unspecified severity I89.0 Lymphedema, not elsewhere classified I87.2 Venous insufficiency (chronic) (peripheral) E11.9 Type 2 diabetes mellitus without complications Facility Procedures CPT4 Code: [id number] Description: [id number] - WOUND CARE VISIT-LEV 3 EST PT Modifier: Quantity: 1 Physician Procedures Electronic Signature(s) Signed: 03/03/2021 5:31:46 PM By: Kayla Shan DO Entered By: Kayla [name] on 03/03/2021 17:26:17

## 2021-03-04 NOTE — ED Triage Notes (Signed)
Pt BIB EMS due to having hypotension. On EMS arrival pt had BP of 80/50 and HR in the 150s. Pt was given 400cc of NS. Pt c/o indigestion and headache.

## 2021-03-04 NOTE — ED Provider Notes (Signed)
[facility] Emergency Department Provider Note MRN:  [id number]  Arrival date & time: 03/05/21     Chief Complaint   Hypotension   History of Present Illness   Kayla [name] is a 80 y.o. year-old female with a history of hypertension, diabetes, PE presenting to the ED with chief complaint of hypotension.  Patient reports feeling suddenly unwell about 1 hour ago.  General malaise, mild headache, indigestion.  Denies chest pain or shortness of breath.  Feels heart racing.  Review of Systems  Positive for palpitations, malaise, fatigue.  Patient's Health History    Past Medical History:  Diagnosis Date   Arthritis    Diabetes mellitus    Hypertension    Obesity    Pulmonary embolus (HCC)     Past Surgical History:  Procedure Laterality Date   ABDOMINAL HYSTERECTOMY     CHOLECYSTECTOMY     JOINT REPLACEMENT  Right    Family History  Problem Relation Age of Onset   Hypertension Mother     Social History   Socioeconomic History   Marital status: Married    Spouse name: Not on file   Number of children: Not on file   Years of education: Not on file   Highest education level: Not on file  Occupational History   Not on file  Tobacco Use   Smoking status: Never   Smokeless tobacco: Never  Vaping Use   Vaping Use: Never used  Substance and Sexual Activity   Alcohol use: No   Drug use: No   Sexual activity: Not Currently  Other Topics Concern   Not on file  Social History Narrative   Not on file   Social Determinants of Health   Financial Resource Strain: Not on file  Food Insecurity: Not on file  Transportation Needs: Not on file  Physical Activity: Not on file  Stress: Not on file  Social Connections: Not on file  Intimate Partner Violence: Not on file     Physical Exam   Vitals:   03/05/21 0400 03/05/21 0609  BP: 121/64 (!) 152/86  Pulse: 70 80  Resp: 17 18  Temp:    SpO2: 99% 99%    CONSTITUTIONAL: Well-appearing,  NAD NEURO:  Alert and oriented x 3, no focal deficits EYES:  eyes equal and reactive ENT/NECK:  no LAD, no JVD CARDIO: Tachycardic rate, well-perfused, normal S1 and S2 PULM:  CTAB no wheezing or rhonchi GI/GU:  normal bowel sounds, non-distended, non-tender MSK/SPINE:  No gross deformities, no edema SKIN:  no rash, atraumatic PSYCH:  Appropriate speech and behavior  *Additional and/or pertinent findings included in MDM below  Diagnostic and Interventional Summary    EKG Interpretation  Date/Time:  Friday March 04 2021 23:45:28 EDT Ventricular Rate:  147 PR Interval:    QRS Duration: 100 QT Interval:  309 QTC Calculation: 484 R Axis:   10 Text Interpretation: Atrial flutter with 2 to 1 block Confirmed by Gerlene Fee [phone]) on 03/05/2021 1:29:48 AM        EKG Interpretation  Date/Time:  Saturday March 05 2021 03:18:47 EDT Ventricular Rate:  78 PR Interval:  164 QRS Duration: 89 QT Interval:  405 QTC Calculation: 462 R Axis:   9 Text Interpretation: Sinus rhythm Abnormal R-wave progression, early transition Confirmed by Gerlene Fee [phone]) on 03/05/2021 4:08:18 AM        Labs Reviewed  CBC - Abnormal; Notable for the following components:      Result Value  RBC 3.68 (*)    Hemoglobin 9.6 (*)    HCT 31.4 (*)    All other components within normal limits  COMPREHENSIVE METABOLIC PANEL - Abnormal; Notable for the following components:   CO2 21 (*)    Glucose, Bld 163 (*)    BUN 32 (*)    Creatinine, Ser 1.62 (*)    Calcium 8.8 (*)    Albumin 3.2 (*)    GFR, Estimated 32 (*)    All other components within normal limits  TROPONIN I (HIGH SENSITIVITY) - Abnormal; Notable for the following components:   Troponin I (High Sensitivity) 116 (*)    All other components within normal limits  TROPONIN I (HIGH SENSITIVITY) - Abnormal; Notable for the following components:   Troponin I (High Sensitivity) 489 (*)    All other components within normal limits  RESP  PANEL BY RT-PCR (FLU A&B, COVID) ARPGX2  MAGNESIUM  BRAIN NATRIURETIC PEPTIDE  TSH  LIPID PANEL  HEMOGLOBIN A1C  TROPONIN I (HIGH SENSITIVITY)    CT Angio Chest Pulmonary Embolism (PE) W or WO Contrast  Final Result    DG Chest Port 1 View  Final Result      Medications  atorvastatin (LIPITOR) tablet 80 mg (has no administration in time range)  carvedilol (COREG) tablet 3.125 mg (has no administration in time range)  acetaminophen (TYLENOL) tablet 650 mg (has no administration in time range)    Or  acetaminophen (TYLENOL) suppository 650 mg (has no administration in time range)  insulin aspart (novoLOG) injection 0-9 Units (has no administration in time range)  insulin aspart (novoLOG) injection 0-5 Units (has no administration in time range)  apixaban (ELIQUIS) tablet 5 mg (has no administration in time range)  sodium chloride 0.9 % bolus 500 mL (0 mLs Intravenous Stopped 03/05/21 0155)  etomidate (AMIDATE) injection 10 mg (10 mg Intravenous Given 03/05/21 0051)  iohexol (OMNIPAQUE) 350 MG/ML injection 75 mL (75 mLs Intravenous Contrast Given 03/05/21 0308)     Procedures  /  Critical Care .Critical Care  Date/Time: 03/05/2021 1:30 AM Performed by: Maudie Flakes, MD Authorized by: Maudie Flakes, MD   Critical care provider statement:    Critical care time (minutes):  35   Critical care was necessary to treat or prevent imminent or life-threatening deterioration of the following conditions: Atrial flutter with RVR and hypotension.   Critical care was time spent personally by me on the following activities:  Discussions with consultants, evaluation of patient's response to treatment, examination of patient, ordering and performing treatments and interventions, ordering and review of laboratory studies, ordering and review of radiographic studies, pulse oximetry, re-evaluation of patient's condition, obtaining history from patient or surrogate and review of old  charts .Sedation  Date/Time: 03/05/2021 1:31 AM Performed by: Maudie Flakes, MD Authorized by: Maudie Flakes, MD   Consent:    Consent obtained:  Verbal   Consent given by:  Patient   Risks discussed:  Allergic reaction, dysrhythmia, inadequate sedation, nausea, vomiting, respiratory compromise necessitating ventilatory assistance and intubation and prolonged hypoxia resulting in organ damage Universal protocol:    Immediately prior to procedure, a time out was called: yes     Patient identity confirmed:  Verbally with patient Indications:    Procedure performed:  Cardioversion   Procedure necessitating sedation performed by:  Physician performing sedation Pre-sedation assessment:    Time since last food or drink:  4 hours   ASA classification: class 2 - patient with mild  systemic disease     Mouth opening:  3 or more finger widths   Mallampati score:  I - soft palate, uvula, fauces, pillars visible   Neck mobility: normal     Pre-sedation assessments completed and reviewed: airway patency, cardiovascular function, hydration status, mental status, nausea/vomiting, pain level, respiratory function and temperature   Immediate pre-procedure details:    Reassessment: Patient reassessed immediately prior to procedure     Reviewed: vital signs, relevant labs/tests and NPO status     Verified: bag valve mask available, emergency equipment available, intubation equipment available, IV patency confirmed, oxygen available and suction available   Procedure details (see MAR for exact dosages):    Preoxygenation:  Nasal cannula   Sedation:  Etomidate   Intended level of sedation: deep   Intra-procedure monitoring:  Blood pressure monitoring, cardiac monitor, continuous pulse oximetry, continuous capnometry, frequent LOC assessments and frequent vital sign checks   Intra-procedure events: none     Total Provider sedation time (minutes):  18 Post-procedure details:    Attendance: Constant  attendance by certified staff until patient recovered     Recovery: Patient returned to pre-procedure baseline     Post-sedation assessments completed and reviewed: airway patency, cardiovascular function, hydration status, mental status, nausea/vomiting, pain level, respiratory function and temperature     Patient is stable for discharge or admission: yes     Procedure completion:  Tolerated well, no immediate complications .Cardioversion  Date/Time: 03/05/2021 1:32 AM Performed by: Maudie Flakes, MD Authorized by: Maudie Flakes, MD   Consent:    Consent obtained:  Verbal   Consent given by:  Patient   Risks discussed:  Cutaneous burn, death, induced arrhythmia and pain Pre-procedure details:    Cardioversion basis:  Emergent   Rhythm:  Atrial flutter   Electrode placement:  Anterior-posterior Patient sedated: Yes. Refer to sedation procedure documentation for details of sedation.  Attempt one:    Cardioversion mode:  Synchronous   Waveform:  Biphasic   Shock (Joules):  120   Shock outcome:  Conversion to normal sinus rhythm Post-procedure details:    Patient status:  Awake   Patient tolerance of procedure:  Tolerated well, no immediate complications  ED Course and Medical Decision Making  I have reviewed the triage vital signs, the nursing notes, and pertinent available records from the EMR.  Listed above are laboratory and imaging tests that I personally ordered, reviewed, and interpreted and then considered in my medical decision making (see below for details).  Patient arrives hypotensive with heart rate of 148 consistently, EKG suggestive of 2-1 atrial flutter with RVR.  Blood pressure persistently 80s over 50s.  Best course of action at this point would be cardioversion.  Procedure explained to patient and she agrees with this intervention.     Successful cardioversion as described above.  Improved blood pressures.  Patient has a history of PE back in 2012, was on blood  thinners but is no longer on blood thinners.  We will pursue CTA to exclude PE as cause of patient's new onset atrial flutter with RVR.  CTA is negative for PE.  Troponin is elevated and increasing up to over 400 on repeat.  Discussed case with Dr. Toney Rakes, cards fellow who recommends hospitalist admission and they will follow in consultation.  Barth Kirks. Sedonia Small, [place] [email or web address]  Final Clinical Impressions(s) / ED Diagnoses     ICD-10-CM   1. Atrial flutter with rapid ventricular  response ([place])  I48.92     2. Elevated troponin  R77.8       ED Discharge Orders     None        Discharge Instructions Discussed with and Provided to Patient:   Discharge Instructions   None       Maudie Flakes, MD 03/05/21 [phone]

## 2021-03-04 NOTE — Progress Notes (Signed)
Kayla, [name] ([id number]) Visit Report for 03/03/2021 Arrival Information Details Patient Name: Date of Service: Kayla [name] NES, [name] 03/03/2021 3:00 PM Medical Record Number: [id number] Patient Account Number: [id number] Date of Birth/Sex: Treating RN: [birth year] (80 y.o. Kayla [name], Kayla [name] Primary Care Klinton Candelas: Jill Alexanders Other Clinician: Referring Kayla [name]: Treating Kayla [name]/Extender: Kayla [name] in Treatment: 3 Visit Information History Since Last Visit Added or deleted any medications: No Patient Arrived: Wheel Chair Any new allergies or adverse reactions: No Arrival Time: 15:35 Had a fall or experienced change in No Accompanied By: self activities of daily living that may affect Transfer Assistance: None risk of falls: Patient Identification Verified: Yes Signs or symptoms of abuse/neglect since last visito No Secondary Verification Process Completed: Yes Hospitalized since last visit: No Patient Requires Transmission-Based Precautions: No Implantable device outside of the clinic excluding No Patient Has Alerts: Yes cellular tissue based products placed in the center Patient Alerts: Patient on Blood Thinner since last visit: VVS ABI 6/27 R=0.94 Has Dressing in Place as Prescribed: Yes VVS ABI 6/27 L=[name] Has Compression in Place as Prescribed: No Pain Present Now: Yes Electronic Signature(s) Signed: 03/03/2021 5:39:10 PM By: Baruch Gouty RN, BSN Entered By: Baruch Gouty on 03/03/2021 15:42:03 -------------------------------------------------------------------------------- Clinic Level of Care Assessment Details Patient Name: Date of Service: [facility] NES, [place]. 03/03/2021 3:00 PM Medical Record Number: [id number] Patient Account Number: [id number] Date of Birth/Sex: Treating RN: January 06, 1941 (80 y.o. Kayla [name] Primary Care Leyton Magoon: Jill Alexanders Other Clinician: Referring Aurianna Earlywine: Treating Thatcher Doberstein/Extender: Kayla [name] in Treatment: 3 Clinic Level of Care Assessment Items TOOL 4 Quantity Score X- 1 0 Use when only an EandM is performed on FOLLOW-UP visit ASSESSMENTS - Nursing Assessment / Reassessment X- 1 10 Reassessment of Co-morbidities (includes updates in patient status) X- 1 5 Reassessment of Adherence to Treatment Plan ASSESSMENTS - Wound and Skin A ssessment / Reassessment X - Simple Wound Assessment / Reassessment - one wound 1 5 '[]'$  - 0 Complex Wound Assessment / Reassessment - multiple wounds X- 1 10 Dermatologic / Skin Assessment (not related to wound area) ASSESSMENTS - Focused Assessment X- 1 5 Circumferential Edema Measurements - multi extremities X- 1 10 Nutritional Assessment / Counseling / Intervention '[]'$  - 0 Lower Extremity Assessment (monofilament, tuning fork, pulses) '[]'$  - 0 Peripheral Arterial Disease Assessment (using hand held doppler) ASSESSMENTS - Ostomy and/or Continence Assessment and Care '[]'$  - 0 Incontinence Assessment and Management '[]'$  - 0 Ostomy Care Assessment and Management (repouching, etc.) PROCESS - Coordination of Care X - Simple Patient / Family Education for ongoing care 1 15 '[]'$  - 0 Complex (extensive) Patient / Family Education for ongoing care X- 1 10 Staff obtains Programmer, systems, Records, T Results / Process Orders est '[]'$  - 0 Staff telephones HHA, Nursing Homes / Clarify orders / etc '[]'$  - 0 Routine Transfer to another Facility (non-emergent condition) '[]'$  - 0 Routine Hospital Admission (non-emergent condition) '[]'$  - 0 New Admissions / Biomedical engineer / Ordering NPWT Apligraf, etc. , '[]'$  - 0 Emergency Hospital Admission (emergent condition) X- 1 10 Simple Discharge Coordination '[]'$  - 0 Complex (extensive) Discharge Coordination PROCESS - Special Needs '[]'$  - 0 Pediatric / Minor Patient Management '[]'$  - 0 Isolation Patient Management '[]'$  - 0 Hearing / Language / Visual special needs '[]'$  - 0 Assessment of Community assistance  (transportation, D/C planning, etc.) '[]'$  - 0 Additional assistance / Altered mentation '[]'$  - 0 Support Surface(s) Assessment (bed, cushion, seat, etc.) INTERVENTIONS - Wound  Cleansing / Measurement X - Simple Wound Cleansing - one wound 1 5 '[]'$  - 0 Complex Wound Cleansing - multiple wounds X- 1 5 Wound Imaging (photographs - any number of wounds) '[]'$  - 0 Wound Tracing (instead of photographs) X- 1 5 Simple Wound Measurement - one wound '[]'$  - 0 Complex Wound Measurement - multiple wounds INTERVENTIONS - Wound Dressings '[]'$  - 0 Small Wound Dressing one or multiple wounds '[]'$  - 0 Medium Wound Dressing one or multiple wounds '[]'$  - 0 Large Wound Dressing one or multiple wounds '[]'$  - 0 Application of Medications - topical '[]'$  - 0 Application of Medications - injection INTERVENTIONS - Miscellaneous '[]'$  - 0 External ear exam '[]'$  - 0 Specimen Collection (cultures, biopsies, blood, body fluids, etc.) '[]'$  - 0 Specimen(s) / Culture(s) sent or taken to Lab for analysis '[]'$  - 0 Patient Transfer (multiple staff / Civil Service fast streamer / Similar devices) '[]'$  - 0 Simple Staple / Suture removal (25 or less) '[]'$  - 0 Complex Staple / Suture removal (26 or more) '[]'$  - 0 Hypo / Hyperglycemic Management (close monitor of Blood Glucose) '[]'$  - 0 Ankle / Brachial Index (ABI) - do not check if billed separately X- 1 5 Vital Signs Has the patient been seen at the hospital within the last three years: Yes Total Score: 100 Level Of Care: New/Established - Level 3 Electronic Signature(s) Signed: 03/03/2021 6:14:36 PM By: Deon Pilling Entered By: Deon Pilling on 03/03/2021 17:16:16 -------------------------------------------------------------------------------- Lower Extremity Assessment Details Patient Name: Date of Service: Kayla [name] NES, Silv[place] A. 03/03/2021 3:00 PM Medical Record Number: [id number] Patient Account Number: [id number] Date of Birth/Sex: Treating RN: May 04, 1941 (80 y.o. Kayla [name] Primary Care  Nicoya Friel: Jill Alexanders Other Clinician: Referring Jarrett Albor: Treating Kingsley Herandez/Extender: Kayla [name] in Treatment: 3 Edema Assessment Assessed: [Left: No] [Right: No] Edema: [Left: Ye] [Right: s] Calf Left: Right: Point of Measurement: 31 cm From Medial Instep 48 cm Ankle Left: Right: Point of Measurement: 8 cm From Medial Instep 30 cm Vascular Assessment Pulses: Dorsalis Pedis Palpable: [Left:Yes] Electronic Signature(s) Signed: 03/03/2021 5:39:10 PM By: Baruch Gouty RN, BSN Entered By: Baruch Gouty on 03/03/2021 15:46:36 -------------------------------------------------------------------------------- Multi Wound Chart Details Patient Name: Date of Service: Kayla [name] NES, Robynn A. 03/03/2021 3:00 PM Medical Record Number: [id number] Patient Account Number: [id number] Date of Birth/Sex: Treating RN: [birth year] (80 y.o. [name] Primary Care Deaaron Fulghum: Jill Alexanders Other Clinician: Referring Yaritzi Craun: Treating Elmond Poehlman/Extender: Kayla [name] in Treatment: 3 Vital Signs Height(in): 64 Capillary Blood Glucose(mg/dl): 110 Weight(lbs): 228 Pulse(bpm): 80 Body Mass Index(BMI): 46 Blood Pressure(mmHg): 170/71 Temperature(F): 98.4 Respiratory Rate(breaths/min): 18 Photos: [N/A:N/A] Left, Anterior Lower Leg N/A N/A Wound Location: Gradually Appeared N/A N/A Wounding Event: Venous Leg Ulcer N/A N/A Primary Etiology: Lymphedema, Hypertension, Type II N/A N/A Comorbid History: Diabetes, Rheumatoid Arthritis, Osteoarthritis, Neuropathy 01/27/2021 N/A N/A Date Acquired: 3 N/A N/A Weeks of Treatment: Open N/A N/A Wound Status: 0x0x0 N/A N/A Measurements L x W x D (cm) 0 N/A N/A A (cm) : rea 0 N/A N/A Volume (cm) : 100.00% N/A N/A % Reduction in Area: 100.00% N/A N/A % Reduction in Volume: Full Thickness Without Exposed N/A N/A Classification: Support Structures None Present N/A N/A Exudate Amount: None  Present (0%) N/A N/A Granulation Amount: None Present (0%) N/A N/A Necrotic Amount: Fascia: No N/A N/A Exposed Structures: Fat Layer (Subcutaneous Tissue): No Tendon: No Muscle: No Joint: No Bone: No Large (67-100%) N/A N/A Epithelialization: Treatment Notes Electronic Signature(s) Signed: 03/03/2021 5:31:46 PM By: Heber Gurnee,  Janett Billow DO Signed: 03/03/2021 6:14:36 PM By: Deon Pilling Entered By: Kalman Shan on 03/03/2021 17:22:06 -------------------------------------------------------------------------------- Multi-Disciplinary Care Plan Details Patient Name: Date of Service: [facility] NES, [place]. 03/03/2021 3:00 PM Medical Record Number: [id number] Patient Account Number: [id number] Date of Birth/Sex: Treating RN: 1940/08/31 (80 y.o. [name] Primary Care Jireh Vinas: Jill Alexanders Other Clinician: Referring Suella Cogar: Treating Azizah Lisle/Extender: Kayla [name] in Treatment: 3 Active Inactive Electronic Signature(s) Signed: 03/03/2021 6:14:36 PM By: Deon Pilling Entered By: Deon Pilling on 03/03/2021 17:21:32 -------------------------------------------------------------------------------- Pain Assessment Details Patient Name: Date of Service: Kayla [name] NES, Jaleen A. 03/03/2021 3:00 PM Medical Record Number: [id number] Patient Account Number: [id number] Date of Birth/Sex: Treating RN: 06/26/1941 (80 y.o. Kayla [name] Primary Care Braydan Marriott: Jill Alexanders Other Clinician: Referring Afra Tricarico: Treating Jimel Myler/Extender: Kayla [name] in Treatment: 3 Active Problems Location of Pain Severity and Description of Pain Patient Has Paino Yes Site Locations Pain Location: Generalized Pain, Pain in Ulcers With Dressing Change: Yes Duration of the Pain. Constant / Intermittento Constant Rate the pain. Current Pain Level: 8 Worst Pain Level: 9 Least Pain Level: 2 Character of Pain Describe the Pain: Aching Pain Management and  Medication Current Pain Management: Medication: Yes Is the Current Pain Management Adequate: Adequate Rest: Yes How does your wound impact your activities of daily livingo Sleep: Yes Bathing: No Appetite: No Relationship With Others: No Bladder Continence: No Emotions: No Bowel Continence: No Work: No Toileting: No Drive: No Dressing: No Hobbies: No Electronic Signature(s) Signed: 03/03/2021 5:39:10 PM By: Baruch Gouty RN, BSN Entered By: Baruch Gouty on 03/03/2021 15:44:15 -------------------------------------------------------------------------------- Patient/Caregiver Education Details Patient Name: Date of Service: Kayla [name] NES, Milas Gain 8/4/2022andnbsp3:00 PM Medical Record Number: [id number] Patient Account Number: [id number] Date of Birth/Gender: Treating RN: 1940/12/30 (80 y.o. [name] Primary Care Physician: Jill Alexanders Other Clinician: Referring Physician: Treating Physician/Extender: Kayla [name] in Treatment: 3 Education Assessment Education Provided To: Patient Education Topics Provided Wound/Skin Impairment: Handouts: Skin Care Do's and Dont's Methods: Explain/Verbal Responses: Reinforcements needed Electronic Signature(s) Signed: 03/03/2021 6:14:36 PM By: Deon Pilling Entered By: Deon Pilling on 03/03/2021 15:57:38 -------------------------------------------------------------------------------- Wound Assessment Details Patient Name: Date of Service: [name], Brunilda A. 03/03/2021 3:00 PM Medical Record Number: [id number] Patient Account Number: [id number] Date of Birth/Sex: Treating RN: 19-Nov-1940 (80 y.o. Kayla [name], Kayla [name] Primary Care Moorea Boissonneault: Jill Alexanders Other Clinician: Referring [place] Spainhower: Treating Seylah Wernert/Extender: Kayla [name] in Treatment: 3 Wound Status Wound Number: 2 Primary Venous Leg Ulcer Etiology: Wound Location: Left, Anterior Lower Leg Wound Open Wounding Event:  Gradually Appeared Status: Date Acquired: 01/27/2021 Comorbid Lymphedema, Hypertension, Type II Diabetes, Rheumatoid Weeks Of Treatment: 3 History: Arthritis, Osteoarthritis, Neuropathy Clustered Wound: No Photos Wound Measurements Length: (cm) Width: (cm) Depth: (cm) Area: (cm) Volume: (cm) 0 % Reduction in Area: 100% 0 % Reduction in Volume: 100% 0 Epithelialization: Large (67-100%) 0 Tunneling: No 0 Undermining: No Wound Description Classification: Full Thickness Without Exposed Support Structures Exudate Amount: None Present Foul Odor After Cleansing: No Slough/Fibrino No Wound Bed Granulation Amount: None Present (0%) Exposed Structure Necrotic Amount: None Present (0%) Fascia Exposed: No Fat Layer (Subcutaneous Tissue) Exposed: No Tendon Exposed: No Muscle Exposed: No Joint Exposed: No Bone Exposed: No Electronic Signature(s) Signed: 03/03/2021 5:39:10 PM By: Baruch Gouty RN, BSN Entered By: Baruch Gouty on 03/03/2021 15:48:27 -------------------------------------------------------------------------------- [place] Details Patient Name: Date of Service: [name], Geraldine A. 03/03/2021 3:00 PM Medical Record Number: [id number] Patient Account Number: [id number] Date of Birth/Sex: Treating  RN: 12-Sep-1940 (80 y.o. Kayla [name] Primary Care Muriel Wilber: Jill Alexanders Other Clinician: Referring Mabell Esguerra: Treating Xoey Warmoth/Extender: Kayla [name] in Treatment: 3 Vital Signs Time Taken: 15:42 Temperature (F): 98.4 Height (in): 64 Pulse (bpm): 80 Source: Stated Respiratory Rate (breaths/min): 18 Weight (lbs): 228 Blood Pressure (mmHg): 170/71 Source: Stated Capillary Blood Glucose (mg/dl): 110 Body Mass Index (BMI): 39.1 Reference Range: 80 - 120 mg / dl Notes glucose per pt report this am Electronic Signature(s) Signed: 03/03/2021 5:39:10 PM By: Baruch Gouty RN, BSN Entered By: Baruch Gouty on 03/03/2021 15:42:47

## 2021-03-05 ENCOUNTER — Emergency Department (HOSPITAL_COMMUNITY): Payer: Medicare PPO

## 2021-03-05 ENCOUNTER — Other Ambulatory Visit: Payer: Self-pay

## 2021-03-05 ENCOUNTER — Observation Stay (HOSPITAL_COMMUNITY): Payer: Medicare PPO

## 2021-03-05 ENCOUNTER — Encounter (HOSPITAL_COMMUNITY): Payer: Self-pay | Admitting: Internal Medicine

## 2021-03-05 DIAGNOSIS — R778 Other specified abnormalities of plasma proteins: Secondary | ICD-10-CM

## 2021-03-05 DIAGNOSIS — I4891 Unspecified atrial fibrillation: Secondary | ICD-10-CM | POA: Diagnosis not present

## 2021-03-05 DIAGNOSIS — E119 Type 2 diabetes mellitus without complications: Secondary | ICD-10-CM

## 2021-03-05 DIAGNOSIS — I10 Essential (primary) hypertension: Secondary | ICD-10-CM

## 2021-03-05 DIAGNOSIS — I4892 Unspecified atrial flutter: Principal | ICD-10-CM

## 2021-03-05 DIAGNOSIS — R7989 Other specified abnormal findings of blood chemistry: Secondary | ICD-10-CM | POA: Diagnosis not present

## 2021-03-05 DIAGNOSIS — N179 Acute kidney failure, unspecified: Secondary | ICD-10-CM

## 2021-03-05 HISTORY — DX: Type 2 diabetes mellitus without complications: E11.9

## 2021-03-05 LAB — ECHOCARDIOGRAM COMPLETE
AR max vel: 2.39 cm2
AV Area VTI: 2.52 cm2
AV Area mean vel: 2.6 cm2
AV Mean grad: 5 mmHg
AV Peak grad: 8.8 mmHg
Ao pk vel: 1.48 m/s
Area-P 1/2: 3.03 cm2
Height: 64 in
MV VTI: 3.16 cm2
S' Lateral: 2.7 cm
Weight: 3679.04 oz

## 2021-03-05 LAB — CBC
HCT: 31.4 % — ABNORMAL LOW (ref 36.0–46.0)
Hemoglobin: 9.6 g/dL — ABNORMAL LOW (ref 12.0–15.0)
MCH: 26.1 pg (ref 26.0–34.0)
MCHC: 30.6 g/dL (ref 30.0–36.0)
MCV: 85.3 fL (ref 80.0–100.0)
Platelets: 237 10*3/uL (ref 150–400)
RBC: 3.68 MIL/uL — ABNORMAL LOW (ref 3.87–5.11)
RDW: 14.7 % (ref 11.5–15.5)
WBC: 5.3 10*3/uL (ref 4.0–10.5)
nRBC: 0 % (ref 0.0–0.2)

## 2021-03-05 LAB — GLUCOSE, CAPILLARY
Glucose-Capillary: 104 mg/dL — ABNORMAL HIGH (ref 70–99)
Glucose-Capillary: 116 mg/dL — ABNORMAL HIGH (ref 70–99)

## 2021-03-05 LAB — COMPREHENSIVE METABOLIC PANEL
ALT: 19 U/L (ref 0–44)
AST: 31 U/L (ref 15–41)
Albumin: 3.2 g/dL — ABNORMAL LOW (ref 3.5–5.0)
Alkaline Phosphatase: 90 U/L (ref 38–126)
Anion gap: 10 (ref 5–15)
BUN: 32 mg/dL — ABNORMAL HIGH (ref 8–23)
CO2: 21 mmol/L — ABNORMAL LOW (ref 22–32)
Calcium: 8.8 mg/dL — ABNORMAL LOW (ref 8.9–10.3)
Chloride: 107 mmol/L (ref 98–111)
Creatinine, Ser: 1.62 mg/dL — ABNORMAL HIGH (ref 0.44–1.00)
GFR, Estimated: 32 mL/min — ABNORMAL LOW (ref >60)
Glucose, Bld: 163 mg/dL — ABNORMAL HIGH (ref 70–99)
Potassium: 4.2 mmol/L (ref 3.5–5.1)
Sodium: 138 mmol/L (ref 135–145)
Total Bilirubin: 0.5 mg/dL (ref 0.3–1.2)
Total Protein: 6.9 g/dL (ref 6.5–8.1)

## 2021-03-05 LAB — TROPONIN I (HIGH SENSITIVITY)
Troponin I (High Sensitivity): 116 ng/L (ref <18)
Troponin I (High Sensitivity): 489 ng/L (ref <18)
Troponin I (High Sensitivity): 1322 ng/L (ref <18)

## 2021-03-05 LAB — LIPID PANEL
Cholesterol: 219 mg/dL — ABNORMAL HIGH (ref 0–200)
HDL: 45 mg/dL (ref >40)
LDL Cholesterol: 160 mg/dL — ABNORMAL HIGH (ref 0–99)
Total CHOL/HDL Ratio: 4.9 RATIO
Triglycerides: 69 mg/dL (ref <150)
VLDL: 14 mg/dL (ref 0–40)

## 2021-03-05 LAB — CBG MONITORING, ED
Glucose-Capillary: 90 mg/dL (ref 70–99)
Glucose-Capillary: 115 mg/dL — ABNORMAL HIGH (ref 70–99)

## 2021-03-05 LAB — HEMOGLOBIN A1C
Hgb A1c MFr Bld: 6.7 % — ABNORMAL HIGH (ref 4.8–5.6)
Mean Plasma Glucose: 145.59 mg/dL

## 2021-03-05 LAB — RESP PANEL BY RT-PCR (FLU A&B, COVID) ARPGX2
Influenza A by PCR: NEGATIVE
Influenza B by PCR: NEGATIVE
SARS Coronavirus 2 by RT PCR: NEGATIVE

## 2021-03-05 LAB — MAGNESIUM: Magnesium: 1.9 mg/dL (ref 1.7–2.4)

## 2021-03-05 LAB — TSH: TSH: 1.698 u[IU]/mL (ref 0.350–4.500)

## 2021-03-05 LAB — BRAIN NATRIURETIC PEPTIDE: B Natriuretic Peptide: 24.5 pg/mL (ref 0.0–100.0)

## 2021-03-05 MED ORDER — IOHEXOL 350 MG/ML SOLN
75.0000 mL | Freq: Once | INTRAVENOUS | Status: AC | PRN
  Administered 2021-03-05: 75 mL via INTRAVENOUS

## 2021-03-05 MED ORDER — ACETAMINOPHEN 325 MG PO TABS
650.0000 mg | ORAL_TABLET | Freq: Four times a day (QID) | ORAL | Status: DC | PRN
  Administered 2021-03-06 – 2021-03-09 (×5): 650 mg via ORAL
  Filled 2021-03-05 (×5): qty 2

## 2021-03-05 MED ORDER — APIXABAN 5 MG PO TABS
5.0000 mg | ORAL_TABLET | Freq: Two times a day (BID) | ORAL | Status: DC
  Administered 2021-03-05: 5 mg via ORAL
  Filled 2021-03-05: qty 1

## 2021-03-05 MED ORDER — INSULIN ASPART 100 UNIT/ML IJ SOLN
0.0000 [IU] | Freq: Three times a day (TID) | INTRAMUSCULAR | Status: DC
  Administered 2021-03-06: 2 [IU] via SUBCUTANEOUS
  Administered 2021-03-07 – 2021-03-08 (×2): 1 [IU] via SUBCUTANEOUS
  Administered 2021-03-09: 3 [IU] via SUBCUTANEOUS
  Administered 2021-03-09 (×2): 1 [IU] via SUBCUTANEOUS
  Administered 2021-03-10: 2 [IU] via SUBCUTANEOUS
  Administered 2021-03-10: 1 [IU] via SUBCUTANEOUS
  Administered 2021-03-11: 2 [IU] via SUBCUTANEOUS
  Administered 2021-03-12: 1 [IU] via SUBCUTANEOUS
  Administered 2021-03-12: 3 [IU] via SUBCUTANEOUS
  Administered 2021-03-12: 1 [IU] via SUBCUTANEOUS

## 2021-03-05 MED ORDER — ACETAMINOPHEN 650 MG RE SUPP: 650.0000 mg | Freq: Four times a day (QID) | RECTAL | Status: DC | PRN

## 2021-03-05 MED ORDER — APIXABAN 2.5 MG PO TABS
2.5000 mg | ORAL_TABLET | Freq: Two times a day (BID) | ORAL | Status: DC
  Administered 2021-03-05: 2.5 mg via ORAL
  Filled 2021-03-05 (×2): qty 1

## 2021-03-05 MED ORDER — INSULIN ASPART 100 UNIT/ML IJ SOLN: 0.0000 [IU] | Freq: Every day | INTRAMUSCULAR | Status: DC

## 2021-03-05 MED ORDER — ATORVASTATIN CALCIUM 80 MG PO TABS
80.0000 mg | ORAL_TABLET | Freq: Every day | ORAL | Status: DC
  Administered 2021-03-05 – 2021-03-15 (×11): 80 mg via ORAL
  Filled 2021-03-05 (×6): qty 1
  Filled 2021-03-05: qty 2
  Filled 2021-03-05 (×4): qty 1

## 2021-03-05 MED ORDER — CARVEDILOL 3.125 MG PO TABS
3.1250 mg | ORAL_TABLET | Freq: Two times a day (BID) | ORAL | Status: DC
  Administered 2021-03-05 (×2): 3.125 mg via ORAL
  Filled 2021-03-05 (×3): qty 1

## 2021-03-05 NOTE — ED Notes (Signed)
Heart healthy/carb modified lunch tray ordered 

## 2021-03-05 NOTE — ED Notes (Signed)
Attempted to call report x 1  

## 2021-03-05 NOTE — H&P (Signed)
History and Physical    Kayla [name] [id number] DOB: Jan 04, 1941 DOA: 03/04/2021  PCP: Denita Lung, MD Patient coming from: Home  Chief Complaint: Palpitations  HPI: Kayla [name] is a 80 y.o. female with medical history significant of non-insulin-dependent type 2 diabetes, hypertension, history of PE no longer on anticoagulation, obesity, chronic venous insufficiency and lymphedema, chronic right lower extremity wound followed by wound care presented to the ED via EMS for evaluation of palpitations and fatigue.  Found to be in new onset 2:1 atrial flutter with RVR.  Rate in the 150s on arrival.  She was hypotensive with blood pressure in the 80s over 50s.  Labs showing WBC 5.3, hemoglobin 9.6 (not significantly changed from baseline), platelet count 237.  Sodium 138, potassium 4.2, chloride 107, bicarb 21, BUN 32, creatinine 1.6 (baseline 0.9), glucose 163.  High-sensitivity troponin 116 >489.  CT angiogram negative for PE.  Showing mild pulmonary vascular congestion without frank pulmonary edema.  BNP normal.  She was given a 500 cc fluid bolus.  Underwent successful cardioversion and converted to normal sinus rhythm.  Blood pressures improved.  Cardiology consulted.  Patient states tonight around 9 PM she started sweating.  Her head was hurting and she felt like she had acid reflux.  She is not sure if she had heart palpitations.  States her family became concerned and called EMS.  She denies chest pain.  Denies fevers, cough, or any recent illness.  Denies nausea, vomiting, abdominal pain, or diarrhea.  Review of Systems:  All systems reviewed and apart from history of presenting illness, are negative.  Past Medical History:  Diagnosis Date   Arthritis    Diabetes mellitus    Hypertension    Obesity    Pulmonary embolus ([place])     Past Surgical History:  Procedure Laterality Date   ABDOMINAL HYSTERECTOMY     CHOLECYSTECTOMY     JOINT REPLACEMENT  Right     reports that she  has never smoked. She has never used smokeless tobacco. She reports that she does not drink alcohol and does not use drugs.  No Known Allergies  Family History  Problem Relation Age of Onset   Hypertension Mother     Prior to Admission medications   Medication Sig Start Date End Date Taking? Authorizing Provider  acetaminophen (TYLENOL) 650 MG CR tablet Take 650 mg by mouth every 8 (eight) hours as needed for pain.   Yes [provider]  ammonium lactate (LAC-HYDRIN) 12 % lotion Apply 1 application topically as needed for dry skin. 05/09/19  Yes Marzetta Board, DPM  aspirin ([employer] ASPIRIN EC) 325 MG EC tablet USE PER THE INSTRUCTIONS ON PACKAGE LABEL Patient taking differently: Take 325 mg by mouth daily. 12/28/20  Yes Denita Lung, MD  atorvastatin (LIPITOR) 80 MG tablet Take 1 tablet (80 mg total) by mouth daily. 01/20/20  Yes Denita Lung, MD  carvedilol (COREG) 3.125 MG tablet Take 1 tablet (3.125 mg total) by mouth 2 (two) times daily. 12/29/20  Yes Denita Lung, MD  diclofenac Sodium (VOLTAREN) 1 % GEL Apply 2 g topically 4 (four) times daily. Rub into affected area of foot 2 to 4 times daily Patient taking differently: Apply 2 g topically 2 (two) times daily as needed. 08/11/19  Yes Trula Slade, DPM  losartan-hydrochlorothiazide (HYZAAR) 100-12.5 MG tablet TAKE 1 TABLET BY MOUTH EVERY DAY Patient taking differently: Take 1 tablet by mouth daily. 12/28/20  Yes Denita Lung,  MD  metFORMIN (GLUCOPHAGE) 500 MG tablet Take 1 tablet (500 mg total) by mouth 2 (two) times daily with a meal. 12/28/20  Yes Denita Lung, MD  TRIAMCINOLONE ACETONIDE, TOP, 0.05 % OINT Apply to rough skin on heels twice daily Patient taking differently: Apply 1 application topically 2 (two) times daily as needed (rough skin on heels). 06/04/18  Yes Marzetta Board, DPM  Accu-Chek Softclix Lancets lancets 1 each by Other route daily. Use as instructed 03/30/20   Denita Lung, MD   diclofenac (VOLTAREN) 75 MG EC tablet Take 1 tablet (75 mg total) by mouth 2 (two) times daily. Patient not taking: No sig reported 12/09/20   Denita Lung, MD  glucose blood test strip 1 each by Other route as needed for other. Use as instructed 12/31/20   Denita Lung, MD  HYDROcodone-acetaminophen (NORCO/VICODIN) 5-325 MG tablet Take 1 tablet by mouth every 6 (six) hours as needed for moderate pain or severe pain. Patient not taking: No sig reported 10/14/20   Vanessa Kick, MD    Physical Exam: Vitals:   03/05/21 0330 03/05/21 0330 03/05/21 0345 03/05/21 0400  BP:  (!) 144/78 (!) 147/69 121/64  Pulse: 79 80 76 70  Resp: '20 20 17 17  '$ Temp:  (!) 97.4 F (36.3 C)    TempSrc:  Oral    SpO2: 100% 99% 99% 99%    Physical Exam Constitutional:      General: She is not in acute distress. HENT:     Head: Normocephalic and atraumatic.  Eyes:     Extraocular Movements: Extraocular movements intact.     Conjunctiva/sclera: Conjunctivae normal.  Cardiovascular:     Rate and Rhythm: Normal rate and regular rhythm.     Pulses: Normal pulses.  Pulmonary:     Effort: Pulmonary effort is normal. No respiratory distress.     Breath sounds: No wheezing.  Abdominal:     General: Bowel sounds are normal. There is no distension.     Palpations: Abdomen is soft.     Tenderness: There is no abdominal tenderness.  Musculoskeletal:     Cervical back: Normal range of motion and neck supple.     Right lower leg: Edema present.     Left lower leg: Edema present.  Skin:    General: Skin is warm and dry.  Neurological:     General: No focal deficit present.     Mental Status: She is alert and oriented to person, place, and time.     Labs on Admission: I have personally reviewed following labs and imaging studies  CBC: Recent Labs  Lab 03/04/21 2353  WBC 5.3  HGB 9.6*  HCT 31.4*  MCV 85.3  PLT 123XX123   Basic Metabolic Panel: Recent Labs  Lab 03/04/21 2353  NA 138  K 4.2  CL 107   CO2 21*  GLUCOSE 163*  BUN 32*  CREATININE 1.62*  CALCIUM 8.8*  MG 1.9   GFR: CrCl cannot be calculated (Unknown ideal weight.). Liver Function Tests: Recent Labs  Lab 03/04/21 2353  AST 31  ALT 19  ALKPHOS 90  BILITOT 0.5  PROT 6.9  ALBUMIN 3.2*   No results for input(s): LIPASE, AMYLASE in the last 168 hours. No results for input(s): AMMONIA in the last 168 hours. Coagulation Profile: No results for input(s): INR, PROTIME in the last 168 hours. Cardiac Enzymes: No results for input(s): CKTOTAL, CKMB, CKMBINDEX, TROPONINI in the last 168 hours. BNP (last 3  results) No results for input(s): PROBNP in the last 8760 hours. HbA1C: No results for input(s): HGBA1C in the last 72 hours. CBG: No results for input(s): GLUCAP in the last 168 hours. Lipid Profile: No results for input(s): CHOL, HDL, LDLCALC, TRIG, CHOLHDL, LDLDIRECT in the last 72 hours. Thyroid Function Tests: No results for input(s): TSH, T4TOTAL, FREET4, T3FREE, THYROIDAB in the last 72 hours. Anemia Panel: No results for input(s): VITAMINB12, FOLATE, FERRITIN, TIBC, IRON, RETICCTPCT in the last 72 hours. Urine analysis:    Component Value Date/Time   COLORURINE YELLOW 12/01/2010 2352   APPEARANCEUR CLEAR 12/01/2010 2352   LABSPEC 1.019 12/01/2010 2352   PHURINE 5.5 12/01/2010 2352   GLUCOSEU NEGATIVE 12/01/2010 2352   HGBUR NEGATIVE 12/01/2010 2352   BILIRUBINUR n 11/27/2016 1129   KETONESUR NEGATIVE 12/01/2010 2352   PROTEINUR +- 11/27/2016 1129   PROTEINUR NEGATIVE 12/01/2010 2352   UROBILINOGEN negative (A) 11/27/2016 1129   UROBILINOGEN 0.2 12/01/2010 2352   NITRITE n 11/27/2016 1129   NITRITE NEGATIVE 12/01/2010 2352   LEUKOCYTESUR Negative 11/27/2016 1129    Radiological Exams on Admission: CT Angio Chest Pulmonary Embolism (PE) W or WO Contrast  Result Date: 03/05/2021 CLINICAL DATA:  PE suspected, chest discomfort EXAM: CT ANGIOGRAPHY CHEST WITH CONTRAST TECHNIQUE: Multidetector CT  imaging of the chest was performed using the standard protocol during bolus administration of intravenous contrast. Multiplanar CT image reconstructions and MIPs were obtained to evaluate the vascular anatomy. CONTRAST:  60 mL OMNIPAQUE IOHEXOL 350 MG/ML SOLN COMPARISON:  CT 11/25/2016 FINDINGS: Cardiovascular: Satisfactory opacification of pulmonary arteries to the segmental level. Evaluation is somewhat limited by respiratory motion artifact which may limit detection of smaller distal segmental and subsegmental filling defects. No central, lobar or proximal segmental filling defects are identified. Central pulmonary arteries are top-normal caliber. Cardiac size top normal with some mild right atrial enlargement. Three-vessel coronary artery atherosclerosis is seen. No pericardial effusion. Calcifications present on the aortic leaflets. Atherosclerotic plaque within the normal caliber aorta. No clear acute luminal abnormality of the aorta within limitations of suboptimal opacification and technique. Normal 3 vessel branching of the aortic arch without concerning vascular abnormality of the branch vessels. Mediastinum/Nodes: No mediastinal fluid or gas. Normal thyroid gland and thoracic inlet. No acute abnormality of the trachea or esophagus. No worrisome mediastinal or axillary adenopathy. Hilar nodal evaluation is limited in the absence of intravenous contrast media. Lungs/Pleura: Low volumes and atelectasis. Patchy areas of more mosaic attenuation towards the lung bases, can reflect further volume loss, air trapping or small airways disease. Additional bandlike areas of subsegmental atelectasis and/or scarring noted towards the lung bases. No convincing CT features of pulmonary edema. No concerning pulmonary nodules or masses. Mild pulmonary vascular redistribution. No clear features of pulmonary edema. Upper Abdomen: No acute abnormalities present in the visualized portions of the upper abdomen. Musculoskeletal:  The osseous structures appear diffusely demineralized which may limit detection of small or nondisplaced fractures. No acute osseous abnormality or suspicious osseous lesion. Multilevel degenerative changes are present in the imaged portions of the spine. Exaggerated thoracic kyphosis. Multilevel flowing anterior osteophytosis, compatible with features of diffuse idiopathic skeletal hyperostosis (DISH). Additional degenerative changes in the shoulders. No worrisome chest wall mass or lesion. Review of the MIP images confirms the above findings. IMPRESSION: Satisfactory opacification of the pulmonary arteries. No clear central, lobar or proximal segmental filling defects. More distal evaluation limited by respiratory motion artifact. Low volumes and atelectasis. Mosaic ground-glass towards the lung bases, could reflect further atelectatic  volume loss versus underlying air trapping or small airways disease. No consolidative process. Mild pulmonary vascular congestion. No evidence of frank pulmonary edema. Right atrial enlargement. Three-vessel coronary artery atherosclerosis. Aortic Atherosclerosis (ICD10-I70.0). Osseous manifestations of diffuse idiopathic skeletal hyperostosis. Electronically Signed   By: Lovena Le M.D.   On: 03/05/2021 03:33   DG Chest Port 1 View  Result Date: 03/05/2021 CLINICAL DATA:  Hypotension EXAM: PORTABLE CHEST 1 VIEW COMPARISON:  Radiograph 02/08/2018 FINDINGS: Low volumes. Mild pulmonary vascular congestion. Some hazy opacities towards the lung base no pneumothorax or layering effusion. No consolidative process. The aorta is calcified. The remaining cardiomediastinal contours are unremarkable. Could reflect atelectasis and/or early interstitial edema with few peripheral septal lines. IMPRESSION: Low lung volumes. Streaky basilar opacities, may reflect atelectasis and/or early edema given some faint septal lines and vascular congestion. Aortic Atherosclerosis (ICD10-I70.0).  Electronically Signed   By: Lovena Le M.D.   On: 03/05/2021 01:37    EKG: Independently reviewed.  Initial EKG showing atrial flutter with 2:1 block.  Repeat EKG after cardioversion showing normal sinus rhythm.  Assessment/Plan Principal Problem:   Atrial flutter with rapid ventricular response (HCC) Active Problems:   Primary hypertension   Elevated troponin   AKI (acute kidney injury) ([place])   Type 2 diabetes mellitus ([place])   New onset atrial flutter 2:1 Rate initially in the 150s with hemodynamic instability.  Underwent urgent cardioversion and converted normal sinus rhythm.  Blood pressures improved and currently asymptomatic.  CT angiogram negative for PE. -Cardiac monitoring.  CHA2DS2-VASc 6.  Cardiology recommending starting Eliquis 5 mg twice daily.  Continue home Coreg.  Order echocardiogram.  Check TSH level.  Elevated troponin Likely due to demand ischemia.  Patient is not endorsing chest pain.  Now converted to sinus rhythm after urgent cardioversion. -Continue to trend troponin  AKI Likely prerenal from hypotension.  Blood pressure now improved after cardioversion. -Patient was given a fluid bolus in the ED.  Repeat labs to check renal function.  Avoiding additional IV fluids at this time given signs of pulmonary vascular congestion on imaging.  Avoid nephrotoxic agents/contrast.  Hold home losartan-hydrochlorothiazide.  Pulmonary vascular congestion CT showing pulmonary vascular congestion without frank pulmonary edema.  BNP normal.  Bilateral lower extremity edema in the setting of chronic venous insufficiency.  No hypoxia or respiratory distress. Echo done April 2019 showing LVEF 60 to [id number], grade 1 diastolic dysfunction, and no significant valvular abnormalities. -Diuretic not given at this time given AKI.  Repeat echocardiogram ordered.  Non-insulin-dependent type 2 diabetes A1c 6.5 in November 2021. -Repeat A1c.  Sliding scale insulin sensitive  ACHS.  Hypertension Blood pressure now improved after conversion to sinus rhythm. -Continue home Coreg  Hyperlipidemia -Continue Lipitor  DVT prophylaxis: Eliquis Code Status: Patient wishes to be full code. Family Communication: No family available at this time. Disposition Plan: Status is: Observation  The patient remains OBS appropriate and will d/c before 2 midnights.  Dispo: The patient is from: Home              Anticipated d/c is to: Home              Patient currently is not medically stable to d/c.   Difficult to place patient No  Level of care: Level of care: Telemetry Cardiac  The medical decision making on this patient was of high complexity and the patient is at high risk for clinical deterioration, therefore this is a level 3 visit.  Shela Leff MD [facility]  If 7PM-7AM, please contact night-coverage [email or web address]  03/05/2021, 5:57 AM

## 2021-03-05 NOTE — Progress Notes (Signed)
Subjective: Patient admitted this morning, see detailed H&P by Dr Marlowe Sax 75 female with a history of diabetes mellitus type 2, hypertension, history of PE not on anticoagulation, obesity, chronic venous insufficiency, lymphedema, chronic right lower extremity wound followed by wound care came to ED for evaluation of palpitation and fatigue.  Patient was found to be new onset atrial flutter with 2 is to 1 block with RVR.  Patient was hypotensive in the ED with SBP in 80s.  She underwent urgent cardioversion in the ED.  Cardiology was consulted.  Vitals:   03/05/21 1100 03/05/21 1200  BP: (!) 137/45 (!) 147/56  Pulse: 81 82  Resp: (!) 21 (!) 22  Temp:    SpO2: 99% 99%      A/P  New onset atrial flutter 2: 1 block Patient presented with atrial flutter with heart rate 150s, she was hypotensive, underwent urgent cardioversion and converted to normal sinus rhythm.  Cardiology was consulted.  CTA was negative for PE. CHA2DS2VASc score is 6.  Patient started on Eliquis per cardiology recommendation.  Elevated troponin -Likely from demand ischemia from atrial flutter with RVR -Cardiology following  Acute kidney injury -Likely from hypotension -Hold losartan/HCTZ -Follow BMP in am  Pulm vascular congestion -CT chest shows pulmonary vascular congestion without frank pulmonary edema -Bilateral lower extremity edema -Echo from 2019 showed EF of 60 to 65% with grade 1 diastolic function -Repeat echocardiogram ordered  Diabetes mellitus type 2 -Hemoglobin A1c 6.7 -Continue sliding scale insulin NovoLog  Hypertension -Blood pressure improved after conversion to sinus rhythm -Continue Coreg    Kayla [name] [facility] [phone]

## 2021-03-05 NOTE — Progress Notes (Signed)
  Echocardiogram 2D Echocardiogram has been performed.  Kayla [name] 03/05/2021, 4:28 PM

## 2021-03-05 NOTE — Progress Notes (Signed)
ANTICOAGULATION CONSULT NOTE - Initial Consult  Pharmacy Consult for eliquis Indication: atrial fibrillation  No Known Allergies  Vital Signs: Temp: 97.4 F (36.3 C) (08/06 0330) Temp Source: Oral (08/06 0330) BP: 152/86 (08/06 0609) Pulse Rate: 80 (08/06 0609)  Labs: Recent Labs    03/04/21 2353 03/05/21 0153  HGB 9.6*  --   HCT 31.4*  --   PLT 237  --   CREATININE 1.62*  --   TROPONINIHS 116* 489*    CrCl cannot be calculated (Unknown ideal weight.).   Medical History: Past Medical History:  Diagnosis Date   Arthritis    Diabetes mellitus    Hypertension    Obesity    Pulmonary embolus (HCC)     Medications:  See medication history   Goal of Therapy:  Therapeutic anticoagulation Monitor platelets by anticoagulation protocol: Yes   Plan:  Eliquis '5mg'$  po bid F/u with education.  Sheli Dorin Poteet 03/05/2021,6:48 AM

## 2021-03-05 NOTE — ED Notes (Signed)
Checked patient cbg it was 10 notified the RN of blood sugar patient is resting with call bell in reach

## 2021-03-05 NOTE — ED Notes (Signed)
Pt successful cardioverted to NSR. Pt received one shock at 120 jules.

## 2021-03-05 NOTE — ED Notes (Signed)
Date and time results received: 03/05/21 01:15   Test: Troponin  Critical Value: 116  Name of Provider Notified: Dr. Sedonia Small

## 2021-03-05 NOTE — ED Notes (Signed)
Attempted to call report x2

## 2021-03-05 NOTE — Progress Notes (Addendum)
Interval attending note:  Chart reviewed, patient not seen.  Patient seen by fellow this morning after urgent cardioversion.  Troponins elevated prior to cardioversion, difficult to interpret after cardioversion.   Agree that this is likely demand ischemia with elevated rate and hypotension however cannot exclude obstructive CAD with cor cal on CTA.   Agree with starting eliquis for chads2vasc of 6  Will make NPO after midnight and tentatively plan for nuclear stress test in AM.   Cardiology will follow.  Elouise Munroe, MD

## 2021-03-05 NOTE — Discharge Instructions (Signed)
Information on my medicine - ELIQUIS (apixaban)  This medication education was reviewed with me or my healthcare representative as part of my discharge preparation.  Why was Eliquis prescribed for you? Eliquis was prescribed for you to reduce the risk of a blood clot forming that can cause a stroke if you have a medical condition called atrial fibrillation (a type of irregular heartbeat).  What do You need to know about Eliquis ? Take your Eliquis TWICE DAILY - one tablet in the morning and one tablet in the evening with or without food. If you have difficulty swallowing the tablet whole please discuss with your pharmacist how to take the medication safely.  Take Eliquis exactly as prescribed by your doctor and DO NOT stop taking Eliquis without talking to the doctor who prescribed the medication.  Stopping may increase your risk of developing a stroke.  Refill your prescription before you run out.  After discharge, you should have regular check-up appointments with your healthcare provider that is prescribing your Eliquis.  In the future your dose may need to be changed if your kidney function or weight changes by a significant amount or as you get older.  What do you do if you miss a dose? If you miss a dose, take it as soon as you remember on the same day and resume taking twice daily.  Do not take more than one dose of ELIQUIS at the same time to make up a missed dose.  Important Safety Information A possible side effect of Eliquis is bleeding. You should call your healthcare provider right away if you experience any of the following: Bleeding from an injury or your nose that does not stop. Unusual colored urine (red or dark brown) or unusual colored stools (red or black). Unusual bruising for unknown reasons. A serious fall or if you hit your head (even if there is no bleeding).  Some medicines may interact with Eliquis and might increase your risk of bleeding or clotting while  on Eliquis. To help avoid this, consult your healthcare provider or pharmacist prior to using any new prescription or non-prescription medications, including herbals, vitamins, non-steroidal anti-inflammatory drugs (NSAIDs) and supplements.  This website has more information on Eliquis (apixaban): http://www.eliquis.com/eliquis/home  ================================================  Atrial Flutter  Atrial flutter is a type of abnormal heart rhythm (arrhythmia). The heart has an electrical system that tells it how to beat. In atrial flutter, the signals move rapidly in the top chambers of the heart (the atria). This makes your heart beat very fast. Atrial flutter can come and go, or it can be permanent. The goal of treatment is to prevent blood clots from forming, control your heart rate, or restore your heartbeat to a normal rhythm. If this condition is not treated, it can cause serious problems, such as a weakened heart muscle (cardiomyopathy) or a stroke.  What are the causes? This condition is often caused by conditions that damage the heart's electrical system. These include: Heart conditions and heart surgery. These include heart attacks and open-heart surgery. Lung problems, such as COPD or a blood clot in the lung (pulmonary embolism, or PE). Poorly controlled high blood pressure (hypertension). Overactive thyroid (hyperthyroidism). Diabetes. In some cases, the cause of this condition is not known.  What increases the risk? You are more likely to develop this condition if: You are an elderly adult. You are a man. You are overweight (obese). You have obstructive sleep apnea. You have a family history of atrial flutter. You have   diabetes. You drink a lot of alcohol, especially binge drinking. You use drugs, including cannabis. You smoke.  What are the signs or symptoms? Symptoms of this condition include: A feeling that your heart is pounding or racing  (palpitations). Shortness of breath. Chest pain. Feeling dizzy or light-headed. Fainting. Low blood pressure (hypotension). Fatigue. Tiring easily during exercise or activity. In some cases, there are no symptoms.  How is this diagnosed? This condition may be diagnosed with: An electrocardiogram (ECG) to check electrical signals of the heart. An ambulatory cardiac monitor to record your heart's activity for a few days. An echocardiogram to create pictures of your heart. A transesophageal echocardiogram (TEE) to create even better pictures of your heart. A stress test to check your blood supply while you exercise. Imaging tests, such as a CT scan or chest X-ray. Blood tests.  How is this treated? Treatment depends on underlying conditions and how you feel when you experience atrial flutter. This condition may be treated with: Medicines to prevent blood clots or to treat heart rate or heart rhythm problems. Electrical cardioversion to reset the heart's rhythm. Ablation to remove the heart tissue that sends abnormal signals. Left atrial appendage closure to seal the area where blood clots can form. In some cases, underlying conditions will be treated.  Follow these instructions at home:  Medicines Take over-the-counter and prescription medicines only as told by your health care provider. Do not take any new medicines without talking to your health care provider. If you are taking blood thinners: Talk with your health care provider before you take any medicines that contain aspirin or NSAIDs, such as ibuprofen. These medicines increase your risk for dangerous bleeding. Take your medicine exactly as told, at the same time every day. Avoid activities that could cause injury or bruising, and follow instructions about how to prevent falls. Wear a medical alert bracelet or carry a card that lists what medicines you take.  Lifestyle Eat heart-healthy foods. Talk with a dietitian to make  an eating plan that is right for you. Do not use any products that contain nicotine or tobacco, such as cigarettes, e-cigarettes, and chewing tobacco. If you need help quitting, ask your health care provider. Do not drink alcohol. Do not use drugs, including cannabis. Lose weight if you are overweight or obese. Exercise regularly as instructed by your health care provider.  General instructions Do not use diet pills unless your health care provider approves. Diet pills may make heart problems worse. If you have obstructive sleep apnea, manage your condition as told by your health care provider. Keep all follow-up visits as told by your health care provider. This is important.  Contact a health care provider if you: Notice a change in the rate, rhythm, or strength of your heartbeat. Are taking a blood thinner and you notice more bruising. Have a sudden change in weight. Tire more easily when you exercise or do heavy work.  Get help right away if you have: Pain or pressure in your chest. Shortness of breath. Fainting. Increasing sweating with no known cause. Side effects of blood thinners, such as blood in your vomit, stool, or urine, or bleeding that cannot stop. Any symptoms of a stroke. "BE FAST" is an easy way to remember the main warning signs of a stroke: B - Balance. Signs are dizziness, sudden trouble walking, or loss of balance. E - Eyes. Signs are trouble seeing or a sudden change in vision. F - Face. Signs are sudden weakness or   numbness of the face, or the face or eyelid drooping on one side. A - Arms. Signs are weakness or numbness in an arm. This happens suddenly and usually on one side of the body. S - Speech. Signs are sudden trouble speaking, slurred speech, or trouble understanding what people say. T - Time. Time to call emergency services. Write down what time symptoms started. Other signs of a stroke, such as: A sudden, severe headache with no known cause. Nausea or  vomiting. Seizure. These symptoms may represent a serious problem that is an emergency. Do not wait to see if the symptoms will go away. Get medical help right away. Call your local emergency services (911 in the [place] Do not drive yourself to the hospital.  Summary Atrial flutter is an abnormal heart rhythm that can give you symptoms of palpitations, shortness of breath, or fatigue. Atrial flutter is often treated with medicines to keep your heart in a normal rhythm and to prevent a stroke. Get help right away if you cannot catch your breath, or have chest pain or pressure. Get help right away if you have signs or symptoms of a stroke. This information is not intended to replace advice given to you by your health care provider. Make sure you discuss any questions you have with your health care provider. Document Revised: 01/08/2019 Document Reviewed: 01/08/2019 Elsevier Patient Education  2020 [employer]   

## 2021-03-05 NOTE — Consult Note (Signed)
Cardiology Consultation:   Patient ID: Kayla [name] MRN: [id number]; DOB: [birth year]  Admit date: 03/04/2021 Date of Consult: 03/05/2021  PCP:  Denita Lung, MD   [facility] Providers Cardiologist:  None        Patient Profile:   Kayla [name] is a 80 y.o. female with a hx of non-insulin-dependent type 2 diabetes, hypertension, history of PE no longer on anticoagulation, obesity, chronic venous insufficiency and lymphedema, chronic right lower extremity wound followed by wound care who is being seen 03/05/2021 for the evaluation of elevated troponin, Aflutter RVR s/p DCCV to NSR at the request of Dr Marlowe Sax.  History of Present Illness:   Kayla [name] is a 80 y.o. female with a hx of non-insulin-dependent type 2 diabetes, hypertension, history of PE no longer on anticoagulation, obesity, chronic venous insufficiency and lymphedema, chronic right lower extremity wound followed by wound care who is being seen 03/05/2021 for the evaluation of elevated troponin, Aflutter RVR s/p DCCV to NSR at the request of Dr Marlowe Sax.  Patient presented to the ER with c/o palpitations, sob, fatigue, sweating, indigestion- all of the symptoms started abruptly last night. EMS noted to be in rapid Aflutter, 2:1  (HR 150s) associatted with hypotension 80/50s. Labs significant only for Cr 1.6, trops uptrending 116->489.  CT PE negative. Given hemodynamic instability- she was emergently DCCV to NSR.  The patient denies any chest pain, dizziness, syncope, bleeding problems, prior h/o MI, CAD, CVA. Does have h/o PE and chronic venous stasis She does report on and off palpitations but never bothered until last night and 1 other distinct episode but did not need medical attention. Functiional status <4 METs, walks at home with a walker No other infectious symptoms.  Past Medical History:  Diagnosis Date   Arthritis    Diabetes mellitus    Hypertension    Obesity    Pulmonary embolus (HCC)     Past  Surgical History:  Procedure Laterality Date   ABDOMINAL HYSTERECTOMY     CHOLECYSTECTOMY     JOINT REPLACEMENT  Right     Home Medications:  Prior to Admission medications   Medication Sig Start Date End Date Taking? Authorizing Provider  acetaminophen (TYLENOL) 650 MG CR tablet Take 650 mg by mouth every 8 (eight) hours as needed for pain.   Yes [provider]  ammonium lactate (LAC-HYDRIN) 12 % lotion Apply 1 application topically as needed for dry skin. 05/09/19  Yes Marzetta Board, DPM  aspirin ([employer] ASPIRIN EC) 325 MG EC tablet USE PER THE INSTRUCTIONS ON PACKAGE LABEL Patient taking differently: Take 325 mg by mouth daily. 12/28/20  Yes Denita Lung, MD  atorvastatin (LIPITOR) 80 MG tablet Take 1 tablet (80 mg total) by mouth daily. 01/20/20  Yes Denita Lung, MD  carvedilol (COREG) 3.125 MG tablet Take 1 tablet (3.125 mg total) by mouth 2 (two) times daily. 12/29/20  Yes Denita Lung, MD  diclofenac Sodium (VOLTAREN) 1 % GEL Apply 2 g topically 4 (four) times daily. Rub into affected area of foot 2 to 4 times daily Patient taking differently: Apply 2 g topically 2 (two) times daily as needed. 08/11/19  Yes Trula Slade, DPM  losartan-hydrochlorothiazide (HYZAAR) 100-12.5 MG tablet TAKE 1 TABLET BY MOUTH EVERY DAY Patient taking differently: Take 1 tablet by mouth daily. 12/28/20  Yes Denita Lung, MD  metFORMIN (GLUCOPHAGE) 500 MG tablet Take 1 tablet (500 mg total) by mouth 2 (two) times daily with  a meal. 12/28/20  Yes Denita Lung, MD  TRIAMCINOLONE ACETONIDE, TOP, 0.05 % OINT Apply to rough skin on heels twice daily Patient taking differently: Apply 1 application topically 2 (two) times daily as needed (rough skin on heels). 06/04/18  Yes Marzetta Board, DPM  Accu-Chek Softclix Lancets lancets 1 each by Other route daily. Use as instructed 03/30/20   Denita Lung, MD  diclofenac (VOLTAREN) 75 MG EC tablet Take 1 tablet (75 mg total) by mouth 2  (two) times daily. Patient not taking: No sig reported 12/09/20   Denita Lung, MD  glucose blood test strip 1 each by Other route as needed for other. Use as instructed 12/31/20   Denita Lung, MD  HYDROcodone-acetaminophen (NORCO/VICODIN) 5-325 MG tablet Take 1 tablet by mouth every 6 (six) hours as needed for moderate pain or severe pain. Patient not taking: No sig reported 10/14/20   Vanessa Kick, MD    Inpatient Medications: Scheduled Meds:  Continuous Infusions:  PRN Meds:   Allergies:   No Known Allergies  Social History:   Social History   Socioeconomic History   Marital status: Married    Spouse name: Not on file   Number of children: Not on file   Years of education: Not on file   Highest education level: Not on file  Occupational History   Not on file  Tobacco Use   Smoking status: Never   Smokeless tobacco: Never  Vaping Use   Vaping Use: Never used  Substance and Sexual Activity   Alcohol use: No   Drug use: No   Sexual activity: Not Currently  Other Topics Concern   Not on file  Social History Narrative   Not on file   Social Determinants of Health   Financial Resource Strain: Not on file  Food Insecurity: Not on file  Transportation Needs: Not on file  Physical Activity: Not on file  Stress: Not on file  Social Connections: Not on file  Intimate Partner Violence: Not on file    Family History:    Family History  Problem Relation Age of Onset   Hypertension Mother      ROS:  Please see the history of present illness.  As noted abnove All other ROS reviewed and negative.     Physical Exam/Data:   Vitals:   03/05/21 0330 03/05/21 0330 03/05/21 0345 03/05/21 0400  BP:  (!) 144/78 (!) 147/69 121/64  Pulse: 79 80 76 70  Resp: '20 20 17 17  '$ Temp:  (!) 97.4 F (36.3 C)    TempSrc:  Oral    SpO2: 100% 99% 99% 99%    Intake/Output Summary (Last 24 hours) at 03/05/2021 0545 Last data filed at 03/05/2021 0155 Gross per 24 hour  Intake  500 ml  Output --  Net 500 ml   Last 3 Weights 01/24/2021 12/09/2020 07/08/2020  Weight (lbs) 230 lb 230 lb 3.2 oz 245 lb 3.2 oz  Weight (kg) 104.327 kg 104.418 kg 111.222 kg     There is no height or weight on file to calculate BMI.  General:  Well nourished, well developed, in no acute distress HEENT: normal Lymph: no adenopathy Neck: no JVD Endocrine:  No thryomegaly Vascular: No carotid bruits; FA pulses 2+ bilaterally without bruits  Cardiac:  normal S1, S2; RRR; no murmur  Lungs:  clear to auscultation bilaterally, no wheezing, rhonchi or rales  Abd: soft, nontender, no hepatomegaly  Ext: no edema Musculoskeletal:  No deformities,  BUE and BLE strength normal and equal Skin: warm and dry  Neuro:  CNs 2-12 intact, no focal abnormalities noted Psych:  Normal affect     Laboratory Data:  High Sensitivity Troponin:   Recent Labs  Lab 03/04/21 2353 03/05/21 0153  TROPONINIHS 116* 489*     Chemistry Recent Labs  Lab 03/04/21 2353  NA 138  K 4.2  CL 107  CO2 21*  GLUCOSE 163*  BUN 32*  CREATININE 1.62*  CALCIUM 8.8*  GFRNONAA 32*  ANIONGAP 10    Recent Labs  Lab 03/04/21 2353  PROT 6.9  ALBUMIN 3.2*  AST 31  ALT 19  ALKPHOS 90  BILITOT 0.5   Hematology Recent Labs  Lab 03/04/21 2353  WBC 5.3  RBC 3.68*  HGB 9.6*  HCT 31.4*  MCV 85.3  MCH 26.1  MCHC 30.6  RDW 14.7  PLT 237   BNP Recent Labs  Lab 03/04/21 2353  BNP 24.5    DDimer No results for input(s): DDIMER in the last 168 hours.   Radiology/Studies:  CT Angio Chest Pulmonary Embolism (PE) W or WO Contrast  Result Date: 03/05/2021 CLINICAL DATA:  PE suspected, chest discomfort EXAM: CT ANGIOGRAPHY CHEST WITH CONTRAST TECHNIQUE: Multidetector CT imaging of the chest was performed using the standard protocol during bolus administration of intravenous contrast. Multiplanar CT image reconstructions and MIPs were obtained to evaluate the vascular anatomy. CONTRAST:  60 mL OMNIPAQUE IOHEXOL  350 MG/ML SOLN COMPARISON:  CT 11/25/2016 FINDINGS: Cardiovascular: Satisfactory opacification of pulmonary arteries to the segmental level. Evaluation is somewhat limited by respiratory motion artifact which may limit detection of smaller distal segmental and subsegmental filling defects. No central, lobar or proximal segmental filling defects are identified. Central pulmonary arteries are top-normal caliber. Cardiac size top normal with some mild right atrial enlargement. Three-vessel coronary artery atherosclerosis is seen. No pericardial effusion. Calcifications present on the aortic leaflets. Atherosclerotic plaque within the normal caliber aorta. No clear acute luminal abnormality of the aorta within limitations of suboptimal opacification and technique. Normal 3 vessel branching of the aortic arch without concerning vascular abnormality of the branch vessels. Mediastinum/Nodes: No mediastinal fluid or gas. Normal thyroid gland and thoracic inlet. No acute abnormality of the trachea or esophagus. No worrisome mediastinal or axillary adenopathy. Hilar nodal evaluation is limited in the absence of intravenous contrast media. Lungs/Pleura: Low volumes and atelectasis. Patchy areas of more mosaic attenuation towards the lung bases, can reflect further volume loss, air trapping or small airways disease. Additional bandlike areas of subsegmental atelectasis and/or scarring noted towards the lung bases. No convincing CT features of pulmonary edema. No concerning pulmonary nodules or masses. Mild pulmonary vascular redistribution. No clear features of pulmonary edema. Upper Abdomen: No acute abnormalities present in the visualized portions of the upper abdomen. Musculoskeletal: The osseous structures appear diffusely demineralized which may limit detection of small or nondisplaced fractures. No acute osseous abnormality or suspicious osseous lesion. Multilevel degenerative changes are present in the imaged portions of  the spine. Exaggerated thoracic kyphosis. Multilevel flowing anterior osteophytosis, compatible with features of diffuse idiopathic skeletal hyperostosis (DISH). Additional degenerative changes in the shoulders. No worrisome chest wall mass or lesion. Review of the MIP images confirms the above findings. IMPRESSION: Satisfactory opacification of the pulmonary arteries. No clear central, lobar or proximal segmental filling defects. More distal evaluation limited by respiratory motion artifact. Low volumes and atelectasis. Mosaic ground-glass towards the lung bases, could reflect further atelectatic volume loss versus underlying air trapping or  small airways disease. No consolidative process. Mild pulmonary vascular congestion. No evidence of frank pulmonary edema. Right atrial enlargement. Three-vessel coronary artery atherosclerosis. Aortic Atherosclerosis (ICD10-I70.0). Osseous manifestations of diffuse idiopathic skeletal hyperostosis. Electronically Signed   By: Lovena Le M.D.   On: 03/05/2021 03:33   DG Chest Port 1 View  Result Date: 03/05/2021 CLINICAL DATA:  Hypotension EXAM: PORTABLE CHEST 1 VIEW COMPARISON:  Radiograph 02/08/2018 FINDINGS: Low volumes. Mild pulmonary vascular congestion. Some hazy opacities towards the lung base no pneumothorax or layering effusion. No consolidative process. The aorta is calcified. The remaining cardiomediastinal contours are unremarkable. Could reflect atelectasis and/or early interstitial edema with few peripheral septal lines. IMPRESSION: Low lung volumes. Streaky basilar opacities, may reflect atelectasis and/or early edema given some faint septal lines and vascular congestion. Aortic Atherosclerosis (ICD10-I70.0). Electronically Signed   By: Lovena Le M.D.   On: 03/05/2021 01:37    EKG:  The EKG was personally reviewed and demonstrates:  2:1 Aflutter with RVR Repeat EKG: NSR Telemetry:  Telemetry was personally reviewed and demonstrates:  NSR  Relevant  CV Studies: ECHO: 2019 Study Conclusions   - Left ventricle: The cavity size was normal. Wall thickness was    increased in a pattern of mild LVH. Systolic function was normal.    The estimated ejection fraction was in the range of 60% to 65%.    Wall motion was normal; there were no regional wall motion    abnormalities. Doppler parameters are consistent with abnormal    left ventricular relaxation (grade 1 diastolic dysfunction).  - Aortic valve: There was no stenosis.  - Mitral valve: There was trivial regurgitation.  - Left atrium: The atrium was mildly dilated.  - Right ventricle: The cavity size was normal. Systolic function    was normal.  - Tricuspid valve: Peak RV-RA gradient (S): 28 mm Hg.  - Pulmonary arteries: PA peak pressure: 31 mm Hg (S).  - Inferior vena cava: The vessel was normal in size. The    respirophasic diameter changes were in the normal range (>= 50%),    consistent with normal central venous pressure.     Assessment and Plan:   Paroxysmal Aflutter 2:1 s/p urgent DCCV-> NSR Elevated troponin 116->489 (likely sec to demand ischemia) HTN, HLD, DM-2 Chronic venous insufficiency/ h/o PE Chronic wound   Plan:  -continue to trend trops, obtain ECHO in am, obtain TSH -given aflutter s/p DCCV, chadsvasc 6, would recommend starting eliquis '5mg'$  BID - elevated trop is likely demand ischemia (no chest pain) but had indigestion. If her trop significantly rises or Echo shows low EF or wma- then could consider ischemic work up with a nuclear scan. Her CT PE is negative and does show coronary calcifications/atherosclerosis. Given no chest pain and age, comorbidities, frailty- conservative approach is also doable. - continue coreg and home medications  - will follow along.   Risk Assessment/Risk Scores:          CHA2DS2-VASc Score = 6  This indicates a 9.7% annual risk of stroke. The patient's score is based upon: CHF History: No HTN History: Yes Diabetes  History: Yes Stroke History: No Vascular Disease History: Yes Age Score: 2 Gender Score: 1        For questions or updates, please contact [place] Please consult [email or web address] for contact info under    Signed, Renae Fickle, MD  03/05/2021 5:45 AM

## 2021-03-06 ENCOUNTER — Observation Stay (HOSPITAL_COMMUNITY): Payer: Medicare PPO

## 2021-03-06 DIAGNOSIS — E11618 Type 2 diabetes mellitus with other diabetic arthropathy: Secondary | ICD-10-CM | POA: Diagnosis not present

## 2021-03-06 DIAGNOSIS — M109 Gout, unspecified: Secondary | ICD-10-CM | POA: Diagnosis present

## 2021-03-06 DIAGNOSIS — M19072 Primary osteoarthritis, left ankle and foot: Secondary | ICD-10-CM | POA: Diagnosis present

## 2021-03-06 DIAGNOSIS — I700 Atherosclerosis of aorta: Secondary | ICD-10-CM | POA: Diagnosis present

## 2021-03-06 DIAGNOSIS — Z6838 Body mass index (BMI) 38.0-38.9, adult: Secondary | ICD-10-CM | POA: Diagnosis not present

## 2021-03-06 DIAGNOSIS — E8809 Other disorders of plasma-protein metabolism, not elsewhere classified: Secondary | ICD-10-CM | POA: Diagnosis not present

## 2021-03-06 DIAGNOSIS — I480 Paroxysmal atrial fibrillation: Secondary | ICD-10-CM | POA: Diagnosis present

## 2021-03-06 DIAGNOSIS — R079 Chest pain, unspecified: Secondary | ICD-10-CM | POA: Diagnosis not present

## 2021-03-06 DIAGNOSIS — R002 Palpitations: Secondary | ICD-10-CM | POA: Diagnosis present

## 2021-03-06 DIAGNOSIS — I4892 Unspecified atrial flutter: Secondary | ICD-10-CM | POA: Diagnosis present

## 2021-03-06 DIAGNOSIS — E785 Hyperlipidemia, unspecified: Secondary | ICD-10-CM | POA: Diagnosis present

## 2021-03-06 DIAGNOSIS — I248 Other forms of acute ischemic heart disease: Secondary | ICD-10-CM | POA: Diagnosis present

## 2021-03-06 DIAGNOSIS — R600 Localized edema: Secondary | ICD-10-CM | POA: Diagnosis present

## 2021-03-06 DIAGNOSIS — Z90710 Acquired absence of both cervix and uterus: Secondary | ICD-10-CM | POA: Diagnosis not present

## 2021-03-06 DIAGNOSIS — I10 Essential (primary) hypertension: Secondary | ICD-10-CM | POA: Diagnosis present

## 2021-03-06 DIAGNOSIS — E119 Type 2 diabetes mellitus without complications: Secondary | ICD-10-CM | POA: Diagnosis present

## 2021-03-06 DIAGNOSIS — Z86711 Personal history of pulmonary embolism: Secondary | ICD-10-CM | POA: Diagnosis not present

## 2021-03-06 DIAGNOSIS — I872 Venous insufficiency (chronic) (peripheral): Secondary | ICD-10-CM | POA: Diagnosis present

## 2021-03-06 DIAGNOSIS — R778 Other specified abnormalities of plasma proteins: Secondary | ICD-10-CM | POA: Diagnosis not present

## 2021-03-06 DIAGNOSIS — I2510 Atherosclerotic heart disease of native coronary artery without angina pectoris: Secondary | ICD-10-CM | POA: Diagnosis present

## 2021-03-06 DIAGNOSIS — E669 Obesity, unspecified: Secondary | ICD-10-CM | POA: Diagnosis present

## 2021-03-06 DIAGNOSIS — Z9049 Acquired absence of other specified parts of digestive tract: Secondary | ICD-10-CM | POA: Diagnosis not present

## 2021-03-06 DIAGNOSIS — N179 Acute kidney failure, unspecified: Secondary | ICD-10-CM | POA: Diagnosis present

## 2021-03-06 DIAGNOSIS — I959 Hypotension, unspecified: Secondary | ICD-10-CM | POA: Diagnosis present

## 2021-03-06 DIAGNOSIS — Z79899 Other long term (current) drug therapy: Secondary | ICD-10-CM | POA: Diagnosis not present

## 2021-03-06 DIAGNOSIS — Z20822 Contact with and (suspected) exposure to covid-19: Secondary | ICD-10-CM | POA: Diagnosis present

## 2021-03-06 DIAGNOSIS — I214 Non-ST elevation (NSTEMI) myocardial infarction: Secondary | ICD-10-CM | POA: Diagnosis present

## 2021-03-06 HISTORY — DX: Unspecified atrial flutter: I48.92

## 2021-03-06 LAB — NM MYOCAR MULTI W/SPECT W/WALL MOTION / EF
Estimated workload: 1 METS
Exercise duration (min): 23 min
Exercise duration (sec): 40 s
LV dias vol: 74 mL (ref 46–106)
LV sys vol: 25 mL
MPHR: 138 {beats}/min
Peak HR: 114 {beats}/min
Percent HR: 82 %
Rest HR: 93 {beats}/min
TID: 1.58

## 2021-03-06 LAB — GLUCOSE, CAPILLARY
Glucose-Capillary: 103 mg/dL — ABNORMAL HIGH (ref 70–99)
Glucose-Capillary: 103 mg/dL — ABNORMAL HIGH (ref 70–99)
Glucose-Capillary: 171 mg/dL — ABNORMAL HIGH (ref 70–99)
Glucose-Capillary: 174 mg/dL — ABNORMAL HIGH (ref 70–99)

## 2021-03-06 LAB — BASIC METABOLIC PANEL
Anion gap: 7 (ref 5–15)
BUN: 22 mg/dL (ref 8–23)
CO2: 24 mmol/L (ref 22–32)
Calcium: 8.7 mg/dL — ABNORMAL LOW (ref 8.9–10.3)
Chloride: 105 mmol/L (ref 98–111)
Creatinine, Ser: 0.91 mg/dL (ref 0.44–1.00)
GFR, Estimated: >60 mL/min (ref >60)
Glucose, Bld: 119 mg/dL — ABNORMAL HIGH (ref 70–99)
Potassium: 4.2 mmol/L (ref 3.5–5.1)
Sodium: 136 mmol/L (ref 135–145)

## 2021-03-06 LAB — CBC
HCT: 29.8 % — ABNORMAL LOW (ref 36.0–46.0)
Hemoglobin: 9.4 g/dL — ABNORMAL LOW (ref 12.0–15.0)
MCH: 25.9 pg — ABNORMAL LOW (ref 26.0–34.0)
MCHC: 31.5 g/dL (ref 30.0–36.0)
MCV: 82.1 fL (ref 80.0–100.0)
Platelets: 237 10*3/uL (ref 150–400)
RBC: 3.63 MIL/uL — ABNORMAL LOW (ref 3.87–5.11)
RDW: 14.6 % (ref 11.5–15.5)
WBC: 4.8 10*3/uL (ref 4.0–10.5)
nRBC: 0 % (ref 0.0–0.2)

## 2021-03-06 MED ORDER — CARVEDILOL 25 MG PO TABS
25.0000 mg | ORAL_TABLET | Freq: Two times a day (BID) | ORAL | Status: DC
  Administered 2021-03-07 – 2021-03-08 (×2): 25 mg via ORAL
  Filled 2021-03-06 (×2): qty 1

## 2021-03-06 MED ORDER — TECHNETIUM TC 99M TETROFOSMIN IV KIT
10.2000 | PACK | Freq: Once | INTRAVENOUS | Status: AC | PRN
  Administered 2021-03-06: 10.2 via INTRAVENOUS

## 2021-03-06 MED ORDER — TECHNETIUM TC 99M TETROFOSMIN IV KIT
32.1000 | PACK | Freq: Once | INTRAVENOUS | Status: AC | PRN
  Administered 2021-03-06: 32.1 via INTRAVENOUS

## 2021-03-06 MED ORDER — REGADENOSON 0.4 MG/5ML IV SOLN
0.4000 mg | Freq: Once | INTRAVENOUS | Status: AC
  Filled 2021-03-06: qty 5

## 2021-03-06 MED ORDER — LOSARTAN POTASSIUM 50 MG PO TABS
50.0000 mg | ORAL_TABLET | Freq: Every day | ORAL | Status: DC
  Administered 2021-03-06 – 2021-03-15 (×10): 50 mg via ORAL
  Filled 2021-03-06 (×10): qty 1

## 2021-03-06 MED ORDER — CARVEDILOL 6.25 MG PO TABS
6.2500 mg | ORAL_TABLET | Freq: Two times a day (BID) | ORAL | Status: DC
  Administered 2021-03-06: 6.25 mg via ORAL
  Filled 2021-03-06: qty 1

## 2021-03-06 MED ORDER — REGADENOSON 0.4 MG/5ML IV SOLN
INTRAVENOUS | Status: AC
  Administered 2021-03-06: 0.4 mg via INTRAVENOUS
  Filled 2021-03-06: qty 5

## 2021-03-06 MED ORDER — CARVEDILOL 12.5 MG PO TABS
12.5000 mg | ORAL_TABLET | Freq: Two times a day (BID) | ORAL | Status: DC
  Administered 2021-03-06: 12.5 mg via ORAL
  Filled 2021-03-06: qty 1

## 2021-03-06 MED ORDER — APIXABAN 5 MG PO TABS: 5.0000 mg | ORAL_TABLET | Freq: Two times a day (BID) | ORAL | Status: DC

## 2021-03-06 MED ORDER — APIXABAN 5 MG PO TABS
5.0000 mg | ORAL_TABLET | Freq: Two times a day (BID) | ORAL | Status: DC
  Administered 2021-03-06 – 2021-03-07 (×3): 5 mg via ORAL
  Filled 2021-03-06 (×3): qty 1

## 2021-03-06 NOTE — Progress Notes (Signed)
   Kayla [name] presented for a nuclear stress test today.  No immediate complications.  Stress imaging is pending at this time.  Preliminary EKG findings may be listed in the chart, but the stress test result will not be finalized until perfusion imaging is complete.  1 day study, [facility] to read.  Rosaria Ferries, PA-C 03/06/2021, 1:01 PM

## 2021-03-06 NOTE — Progress Notes (Addendum)
Progress Note  Patient Name: Kayla [name] Date of Encounter: 03/06/2021  [facility] Cardiologist: Elouise Munroe, MD New  Subjective   Breathing ok, no chest pain, no palpitations  Inpatient Medications    Scheduled Meds:  apixaban  2.5 mg Oral BID   atorvastatin  80 mg Oral Daily   carvedilol  3.125 mg Oral BID WC   insulin aspart  0-5 Units Subcutaneous QHS   insulin aspart  0-9 Units Subcutaneous TID WC   Continuous Infusions:  PRN Meds: acetaminophen **OR** acetaminophen   Vital Signs    Vitals:   03/06/21 0455 03/06/21 0500 03/06/21 0801 03/06/21 0818  BP: (!) 160/71  (!) 183/77 (!) 165/84  Pulse: 91  90 90  Resp: '19  18 18  '$ Temp: 97.8 F (36.6 C)  99.2 F (37.3 C)   TempSrc: Oral  Oral   SpO2: 95%  99%   Weight:  105.5 kg    Height:        Intake/Output Summary (Last 24 hours) at 03/06/2021 1030 Last data filed at 03/06/2021 0502 Gross per 24 hour  Intake 240 ml  Output 1375 ml  Net -1135 ml   Last 3 Weights 03/06/2021 03/05/2021 01/24/2021  Weight (lbs) 232 lb 9.4 oz 229 lb 15 oz 230 lb  Weight (kg) 105.5 kg 104.3 kg 104.327 kg      Telemetry    SR, ST, seen in nuc med - Personally Reviewed  ECG    SR, early transition - Personally Reviewed  Physical Exam   GEN: No acute distress.   Neck: mild JVD Cardiac: RRR, no murmurs, rubs, or gallops.  Respiratory: few rales bilaterally. GI: Soft, nontender, non-distended  MS: No edema; No deformity. Chronic skin changes and healed wounds on both LE Neuro:  Nonfocal  Psych: Normal affect   Labs    High Sensitivity Troponin:   Recent Labs  Lab 03/04/21 2353 03/05/21 0153 03/05/21 0701  TROPONINIHS 116* 489* 1,322*      Chemistry Recent Labs  Lab 03/04/21 2353 03/06/21 0304  NA 138 136  K 4.2 4.2  CL 107 105  CO2 21* 24  GLUCOSE 163* 119*  BUN 32* 22  CREATININE 1.62* 0.91  CALCIUM 8.8* 8.7*  PROT 6.9  --   ALBUMIN 3.2*  --   AST 31  --   ALT 19  --   ALKPHOS 90  --    BILITOT 0.5  --   GFRNONAA 32* >60  ANIONGAP 10 7     Hematology Recent Labs  Lab 03/04/21 2353 03/06/21 0304  WBC 5.3 4.8  RBC 3.68* 3.63*  HGB 9.6* 9.4*  HCT 31.4* 29.8*  MCV 85.3 82.1  MCH 26.1 25.9*  MCHC 30.6 31.5  RDW 14.7 14.6  PLT 237 237    BNP Recent Labs  Lab 03/04/21 2353  BNP 24.5    Lab Results  Component Value Date   TSH 1.698 03/05/2021   Lab Results  Component Value Date   CHOL 219 (H) 03/05/2021   HDL 45 03/05/2021   LDLCALC 160 (H) 03/05/2021   TRIG 69 03/05/2021   CHOLHDL 4.9 03/05/2021   Lab Results  Component Value Date   HGBA1C 6.7 (H) 03/05/2021    DDimer No results for input(s): DDIMER in the last 168 hours.   Radiology    CT Angio Chest Pulmonary Embolism (PE) W or WO Contrast  Result Date: 03/05/2021 CLINICAL DATA:  PE suspected, chest discomfort EXAM: CT ANGIOGRAPHY  CHEST WITH CONTRAST TECHNIQUE: Multidetector CT imaging of the chest was performed using the standard protocol during bolus administration of intravenous contrast. Multiplanar CT image reconstructions and MIPs were obtained to evaluate the vascular anatomy. CONTRAST:  60 mL OMNIPAQUE IOHEXOL 350 MG/ML SOLN COMPARISON:  CT 11/25/2016 FINDINGS: Cardiovascular: Satisfactory opacification of pulmonary arteries to the segmental level. Evaluation is somewhat limited by respiratory motion artifact which may limit detection of smaller distal segmental and subsegmental filling defects. No central, lobar or proximal segmental filling defects are identified. Central pulmonary arteries are top-normal caliber. Cardiac size top normal with some mild right atrial enlargement. Three-vessel coronary artery atherosclerosis is seen. No pericardial effusion. Calcifications present on the aortic leaflets. Atherosclerotic plaque within the normal caliber aorta. No clear acute luminal abnormality of the aorta within limitations of suboptimal opacification and technique. Normal 3 vessel branching of  the aortic arch without concerning vascular abnormality of the branch vessels. Mediastinum/Nodes: No mediastinal fluid or gas. Normal thyroid gland and thoracic inlet. No acute abnormality of the trachea or esophagus. No worrisome mediastinal or axillary adenopathy. Hilar nodal evaluation is limited in the absence of intravenous contrast media. Lungs/Pleura: Low volumes and atelectasis. Patchy areas of more mosaic attenuation towards the lung bases, can reflect further volume loss, air trapping or small airways disease. Additional bandlike areas of subsegmental atelectasis and/or scarring noted towards the lung bases. No convincing CT features of pulmonary edema. No concerning pulmonary nodules or masses. Mild pulmonary vascular redistribution. No clear features of pulmonary edema. Upper Abdomen: No acute abnormalities present in the visualized portions of the upper abdomen. Musculoskeletal: The osseous structures appear diffusely demineralized which may limit detection of small or nondisplaced fractures. No acute osseous abnormality or suspicious osseous lesion. Multilevel degenerative changes are present in the imaged portions of the spine. Exaggerated thoracic kyphosis. Multilevel flowing anterior osteophytosis, compatible with features of diffuse idiopathic skeletal hyperostosis (DISH). Additional degenerative changes in the shoulders. No worrisome chest wall mass or lesion. Review of the MIP images confirms the above findings. IMPRESSION: Satisfactory opacification of the pulmonary arteries. No clear central, lobar or proximal segmental filling defects. More distal evaluation limited by respiratory motion artifact. Low volumes and atelectasis. Mosaic ground-glass towards the lung bases, could reflect further atelectatic volume loss versus underlying air trapping or small airways disease. No consolidative process. Mild pulmonary vascular congestion. No evidence of frank pulmonary edema. Right atrial enlargement.  Three-vessel coronary artery atherosclerosis. Aortic Atherosclerosis (ICD10-I70.0). Osseous manifestations of diffuse idiopathic skeletal hyperostosis. Electronically Signed   By: Lovena Le M.D.   On: 03/05/2021 03:33   DG Chest Port 1 View  Result Date: 03/05/2021 CLINICAL DATA:  Hypotension EXAM: PORTABLE CHEST 1 VIEW COMPARISON:  Radiograph 02/08/2018 FINDINGS: Low volumes. Mild pulmonary vascular congestion. Some hazy opacities towards the lung base no pneumothorax or layering effusion. No consolidative process. The aorta is calcified. The remaining cardiomediastinal contours are unremarkable. Could reflect atelectasis and/or early interstitial edema with few peripheral septal lines. IMPRESSION: Low lung volumes. Streaky basilar opacities, may reflect atelectasis and/or early edema given some faint septal lines and vascular congestion. Aortic Atherosclerosis (ICD10-I70.0). Electronically Signed   By: Lovena Le M.D.   On: 03/05/2021 01:37   ECHOCARDIOGRAM COMPLETE  Result Date: 03/05/2021    ECHOCARDIOGRAM REPORT   Patient Name:   [name] Date of Exam: 03/05/2021 Medical Rec #:  [id number]       Height:       64.0 in Accession #:    [id number]  Weight:       229.9 lb Date of Birth:  08-01-1940        BSA:          2.076 m Patient Age:    38 years        BP:           155/62 mmHg Patient Gender: F               HR:           82 bpm. Exam Location:  Inpatient Procedure: 2D Echo, Cardiac Doppler and Color Doppler Indications:    Atrial flutter with rapid ventricular response  History:        Patient has prior history of Echocardiogram examinations, most                 recent 11/15/2017. Risk Factors:Hypertension and Diabetes. Hx                 pulmonary embolus.  Sonographer:    Clayton Lefort RDCS (AE) Referring Phys: [id number] [place]  1. Left ventricular ejection fraction, by estimation, is 55 to 60%. The left ventricle has normal function. The left ventricle has no regional  wall motion abnormalities. There is mild left ventricular hypertrophy. Left ventricular diastolic parameters are indeterminate.  2. Right ventricular systolic function is normal. The right ventricular size is normal. There is mildly elevated pulmonary artery systolic pressure.  3. Mild mitral valve regurgitation.  4. The aortic valve is tricuspid. Aortic valve regurgitation is not visualized. Mild to moderate aortic valve sclerosis/calcification is present, without any evidence of aortic stenosis.  5. The inferior vena cava is normal in size with <50% respiratory variability, suggesting right atrial pressure of 8 mmHg. Comparison(s): The left ventricular function is unchanged. FINDINGS  Left Ventricle: Left ventricular ejection fraction, by estimation, is 55 to 60%. The left ventricle has normal function. The left ventricle has no regional wall motion abnormalities. The left ventricular internal cavity size was normal in size. There is  mild left ventricular hypertrophy. Left ventricular diastolic parameters are indeterminate. Right Ventricle: The right ventricular size is normal. Right vetricular wall thickness was not assessed. Right ventricular systolic function is normal. There is mildly elevated pulmonary artery systolic pressure. The tricuspid regurgitant velocity is 2.94 m/s, and with an assumed right atrial pressure of 8 mmHg, the estimated right ventricular systolic pressure is [id number]. Left Atrium: Left atrial size was normal in size. Right Atrium: Right atrial size was normal in size. Pericardium: There is no evidence of pericardial effusion. Mitral Valve: There is mild thickening of the mitral valve leaflet(s). Mild mitral annular calcification. Mild mitral valve regurgitation. MV peak gradient, 3.8 mmHg. The mean mitral valve gradient is 2.0 mmHg. Tricuspid Valve: The tricuspid valve is normal in structure. Tricuspid valve regurgitation is mild. Aortic Valve: The aortic valve is tricuspid. Aortic  valve regurgitation is not visualized. Mild to moderate aortic valve sclerosis/calcification is present, without any evidence of aortic stenosis. Aortic valve mean gradient measures 5.0 mmHg. Aortic valve peak gradient measures 8.8 mmHg. Aortic valve area, by VTI measures 2.52 cm. Pulmonic Valve: The pulmonic valve was not well visualized. Pulmonic valve regurgitation is not visualized. No evidence of pulmonic stenosis. Aorta: The aortic root and ascending aorta are structurally normal, with no evidence of dilitation. Venous: The inferior vena cava is normal in size with less than 50% respiratory variability, suggesting right atrial pressure of 8 mmHg. IAS/Shunts: No atrial level shunt detected  by color flow Doppler.  LEFT VENTRICLE PLAX 2D LVIDd:         3.80 cm  Diastology LVIDs:         2.70 cm  LV e' medial:    7.18 cm/s LV PW:         1.40 cm  LV E/e' medial:  11.8 LV IVS:        1.50 cm  LV e' lateral:   8.38 cm/s LVOT diam:     2.10 cm  LV E/e' lateral: 10.1 LV SV:         77 LV SV Index:   37 LVOT Area:     3.46 cm  RIGHT VENTRICLE             IVC RV Basal diam:  3.10 cm     IVC diam: 1.70 cm RV S prime:     19.90 cm/s TAPSE (M-mode): 2.0 cm LEFT ATRIUM             Index       RIGHT ATRIUM           Index LA diam:        3.40 cm 1.64 cm/m  RA Area:     14.70 cm LA Vol (A2C):   32.6 ml 15.70 ml/m RA Volume:   35.20 ml  16.96 ml/m LA Vol (A4C):   49.1 ml 23.65 ml/m LA Biplane Vol: 39.8 ml 19.17 ml/m  AORTIC VALVE AV Area (Vmax):    2.39 cm AV Area (Vmean):   2.60 cm AV Area (VTI):     2.52 cm AV Vmax:           148.00 cm/s AV Vmean:          101.000 cm/s AV VTI:            0.306 m AV Peak Grad:      8.8 mmHg AV Mean Grad:      5.0 mmHg LVOT Vmax:         102.00 cm/s LVOT Vmean:        75.700 cm/s LVOT VTI:          0.223 m LVOT/AV VTI ratio: 0.73  AORTA Ao Root diam: 2.80 cm Ao Asc diam:  3.00 cm MITRAL VALVE                TRICUSPID VALVE MV Area (PHT): 3.03 cm     TR Peak grad:   34.6 mmHg MV  Area VTI:   3.16 cm     TR Vmax:        294.00 cm/s MV Peak grad:  3.8 mmHg MV Mean grad:  2.0 mmHg     SHUNTS MV Vmax:       0.98 m/s     Systemic VTI:  0.22 m MV Vmean:      62.7 cm/s    Systemic Diam: 2.10 cm MV Decel Time: 250 msec MV E velocity: 84.90 cm/s MV A velocity: 111.00 cm/s MV E/A ratio:  0.76 Dorris Carnes MD Electronically signed by Dorris Carnes MD Signature Date/Time: 03/05/2021/5:08:43 PM    Final     Cardiac Studies   LEXISCAN MYOVIEW: 03/06/2021 pending  ECHO: 03/05/2021  1. Left ventricular ejection fraction, by estimation, is 55 to 60%. The  left ventricle has normal function. The left ventricle has no regional  wall motion abnormalities. There is mild left ventricular hypertrophy.  Left ventricular diastolic parameters are indeterminate.   2. Right  ventricular systolic function is normal. The right ventricular  size is normal. There is mildly elevated pulmonary artery systolic  pressure.   3. Mild mitral valve regurgitation.   4. The aortic valve is tricuspid. Aortic valve regurgitation is not  visualized. Mild to moderate aortic valve sclerosis/calcification is  present, without any evidence of aortic stenosis.   5. The inferior vena cava is normal in size with <50% respiratory  variability, suggesting right atrial pressure of 8 mmHg.   Comparison(s): The left ventricular function is unchanged.   Patient Profile     80 y.o. female  with a hx of non-insulin-dependent type 2 diabetes, hypertension, history of PE no longer on anticoagulation, obesity, chronic venous insufficiency and lymphedema, chronic right lower extremity wound followed by wound care, who was being seen 03/05/2021 for the evaluation of elevated troponin, Aflutter RVR s/p DCCV to NSR.  Assessment & Plan    Atrial flutter, RVR - pt maintaining SR, no palpitations - not on BB pta, on Coreg 3.125 mg bid, will increase to 6.25 mg bid - started on Eliquis 5 mg bid, now 2.5 mg bid, have messaged pharmacist  to confirm dose.  2. Elevated troponin - in the setting of RVR and DCCV - EF nl w/ no WMA on echo. - ck MV today to assess for ischemia.  3. AKI -  Cr improved w/ hydration - per IM  Otherwise, per IM Principal Problem:   Atrial flutter with rapid ventricular response (HCC) Active Problems:   Primary hypertension   Elevated troponin   AKI (acute kidney injury) ([place])   Type 2 diabetes mellitus ([place])    For questions or updates, please contact [facility] Please consult [email or web address] for contact info under        Signed, Rosaria Ferries, PA-C  03/06/2021, 10:30 AM    History and all data above reviewed.  Patient examined.  I agree with the findings as above.  The patient has had some brief episodes of palpitations as an out patient before this event.  Currently no pain or SOB.  Feels OK.  The patient exam reveals COR:RRR  ,  Lungs: Clear  ,  Abd: Positive bowel sounds, no rebound no guarding, Ext No edema  .  All available labs, radiology testing, previous records reviewed. Agree with documented assessment and plan.   Atrial flutter:  she is now on low dose beta blocker which is new. I think that she will tolerate a higher dose and I increased this.  She and I discussed that she needs to let us know if she has palpitations in the future and that she needs to not hesitate to come to the ED if she feels again like she did leading up to this admission.  She ultimately might need ablation if she has recurrent events.  She is on the appropriate higher dose of Eliquis.  HTN:  Agree with Losartan and Coreg for her BP.   I would avoid the HCTZ for now.  Elevated Trop:  Suspect demand ischemia but the perfusion results are pending.    Jeneen Rinks Nakiya Rallis  2:02 PM  03/06/2021  Addendum:     Possible anterior ischemia moderate.  Given this I would suggest a CT and we will try to schedule this for tomorrow.  I talked to the patient about this.  I will increase the Coreg to 25 mg as the HR is mildly  elevated.  She might need supplemental beta blocker in the morning.

## 2021-03-06 NOTE — Care Management Obs Status (Signed)
[facility] NOTIFICATION   Patient Details  Name: Kayla [name] MRN: [id number] Date of Birth: May 31, 1941   Medicare Observation Status Notification Given:  Yes    Bartholomew Crews, RN 03/06/2021, 5:50 PM

## 2021-03-06 NOTE — Progress Notes (Signed)
[facility]  PROGRESS NOTE  Kayla [name] [id number] DOB: 08-Jun-1941 DOA: 03/04/2021 PCP: Denita Lung, MD   Brief HPI:    58 female with a history of diabetes mellitus type 2, hypertension, history of PE not on anticoagulation, obesity, chronic venous insufficiency, lymphedema, chronic right lower extremity wound followed by wound care came to ED for evaluation of palpitation and fatigue.  Patient was found to be new onset atrial flutter with 2 is to 1 block with RVR.  Patient was hypotensive in the ED with SBP in 80s.  She underwent urgent cardioversion in the ED.  Cardiology was consulted.   Subjective   Patient seen and examined, went for nuclear Myoview stress test today.  Results are currently pending.  Denies chest pain or shortness of breath.   Assessment/Plan:     New onset atrial flutter 2:1 block -Presented with atrial flutter with heart rate 150s -Was hypotensive on presentation so underwent urgent cardioversion, converted to normal sinus rhythm -CHA2DS2VASc score is 6 -Started on Eliquis per pharmacy  Elevated troponin -Likely demand ischemia from A. fib with RVR -Undergoing nuclear Myoview stress test today -We will follow cardiology recommendations  Acute kidney injury -Likely from hypotension/losartan and HCTZ -Losartan HCTZ on hold -Renal function back to baseline -Dose of Eliquis was reduced to 2.5 mg p.o. twice daily due to renal insufficiency yesterday, will change back dose of Eliquis to 5 mg p.o. twice daily from today.  Pulmonary vascular congestion -CTA chest was negative for PE, shows pulmonary vascular congestion without pulmonary edema. -Also has trace bilateral lower extremity edema -Diuretics per cardiology  Diabetes mellitus type 2 -Hemoglobin A1c 6.7 -Continue sliding scale insulin with NovoLog -CBG well controlled  Hypertension -Blood pressure improved after cardioversion -Blood pressure is now elevated -Continue Coreg -We  will restart losartan at low-dose of 50 mg daily -Continue to hold HCTZ due to renal insufficiency  Scheduled medications:    apixaban  5 mg Oral BID   atorvastatin  80 mg Oral Daily   carvedilol  6.25 mg Oral BID WC   insulin aspart  0-5 Units Subcutaneous QHS   insulin aspart  0-9 Units Subcutaneous TID WC         Data Reviewed:   CBG:  Recent Labs  Lab 03/05/21 1155 03/05/21 1704 03/05/21 2107 03/06/21 0614 03/06/21 1250  GLUCAP 115* 104* 116* 103* 103*    SpO2: 98 %    Vitals:   03/06/21 1114 03/06/21 1116 03/06/21 1122 03/06/21 1247  BP: (!) 170/67 (!) 188/73 (!) 178/70 (!) 155/78  Pulse: (!) 107 (!) 108 (!) 102 88  Resp:    20  Temp:    98.2 F (36.8 C)  TempSrc:    Oral  SpO2:    98%  Weight:      Height:         Intake/Output Summary (Last 24 hours) at 03/06/2021 1315 Last data filed at 03/06/2021 1300 Gross per 24 hour  Intake 240 ml  Output 1377 ml  Net -1137 ml    08/05 1901 - 08/07 0700 In: 740 [P.O.:240] Out: [id number] [Urine:1375]  Filed Weights   03/05/21 1300 03/06/21 0500  Weight: 104.3 kg 105.5 kg    CBC:  Recent Labs  Lab 03/04/21 2353 03/06/21 0304  WBC 5.3 4.8  HGB 9.6* 9.4*  HCT 31.4* 29.8*  PLT 237 237  MCV 85.3 82.1  MCH 26.1 25.9*  MCHC 30.6 31.5  RDW 14.7 14.6    Complete metabolic  panel:  Recent Labs  Lab 03/04/21 2353 03/05/21 0701 03/06/21 0304  NA 138  --  136  K 4.2  --  4.2  CL 107  --  105  CO2 21*  --  24  GLUCOSE 163*  --  119*  BUN 32*  --  22  CREATININE 1.62*  --  0.91  CALCIUM 8.8*  --  8.7*  AST 31  --   --   ALT 19  --   --   ALKPHOS 90  --   --   BILITOT 0.5  --   --   ALBUMIN 3.2*  --   --   MG 1.9  --   --   TSH  --  1.698  --   HGBA1C  --  6.7*  --   BNP 24.5  --   --     No results for input(s): LIPASE, AMYLASE in the last 168 hours.  Recent Labs  Lab 03/04/21 2353 03/05/21 0646  BNP 24.5  --   SARSCOV2NAA  --  NEGATIVE     ------------------------------------------------------------------------------------------------------------------ Recent Labs    03/05/21 0701  CHOL 219*  HDL 45  LDLCALC 160*  TRIG 69  CHOLHDL 4.9    Lab Results  Component Value Date   HGBA1C 6.7 (H) 03/05/2021   ------------------------------------------------------------------------------------------------------------------ Recent Labs    03/05/21 0701  TSH 1.698   ------------------------------------------------------------------------------------------------------------------ No results for input(s): VITAMINB12, FOLATE, FERRITIN, TIBC, IRON, RETICCTPCT in the last 72 hours.  Coagulation profile No results for input(s): INR, PROTIME in the last 168 hours. No results for input(s): DDIMER in the last 72 hours.  Cardiac Enzymes No results for input(s): CKTOTAL, CKMB, CKMBINDEX, TROPONINI in the last 168 hours.  ------------------------------------------------------------------------------------------------------------------    Component Value Date/Time   BNP 24.5 03/04/2021 2353     Antibiotics: Anti-infectives (From admission, onward)    None        Radiology Reports  CT Angio Chest Pulmonary Embolism (PE) W or WO Contrast  Result Date: 03/05/2021 CLINICAL DATA:  PE suspected, chest discomfort EXAM: CT ANGIOGRAPHY CHEST WITH CONTRAST TECHNIQUE: Multidetector CT imaging of the chest was performed using the standard protocol during bolus administration of intravenous contrast. Multiplanar CT image reconstructions and MIPs were obtained to evaluate the vascular anatomy. CONTRAST:  60 mL OMNIPAQUE IOHEXOL 350 MG/ML SOLN COMPARISON:  CT 11/25/2016 FINDINGS: Cardiovascular: Satisfactory opacification of pulmonary arteries to the segmental level. Evaluation is somewhat limited by respiratory motion artifact which may limit detection of smaller distal segmental and subsegmental filling defects. No central, lobar or  proximal segmental filling defects are identified. Central pulmonary arteries are top-normal caliber. Cardiac size top normal with some mild right atrial enlargement. Three-vessel coronary artery atherosclerosis is seen. No pericardial effusion. Calcifications present on the aortic leaflets. Atherosclerotic plaque within the normal caliber aorta. No clear acute luminal abnormality of the aorta within limitations of suboptimal opacification and technique. Normal 3 vessel branching of the aortic arch without concerning vascular abnormality of the branch vessels. Mediastinum/Nodes: No mediastinal fluid or gas. Normal thyroid gland and thoracic inlet. No acute abnormality of the trachea or esophagus. No worrisome mediastinal or axillary adenopathy. Hilar nodal evaluation is limited in the absence of intravenous contrast media. Lungs/Pleura: Low volumes and atelectasis. Patchy areas of more mosaic attenuation towards the lung bases, can reflect further volume loss, air trapping or small airways disease. Additional bandlike areas of subsegmental atelectasis and/or scarring noted towards the lung bases. No convincing CT features of pulmonary  edema. No concerning pulmonary nodules or masses. Mild pulmonary vascular redistribution. No clear features of pulmonary edema. Upper Abdomen: No acute abnormalities present in the visualized portions of the upper abdomen. Musculoskeletal: The osseous structures appear diffusely demineralized which may limit detection of small or nondisplaced fractures. No acute osseous abnormality or suspicious osseous lesion. Multilevel degenerative changes are present in the imaged portions of the spine. Exaggerated thoracic kyphosis. Multilevel flowing anterior osteophytosis, compatible with features of diffuse idiopathic skeletal hyperostosis (DISH). Additional degenerative changes in the shoulders. No worrisome chest wall mass or lesion. Review of the MIP images confirms the above findings.  IMPRESSION: Satisfactory opacification of the pulmonary arteries. No clear central, lobar or proximal segmental filling defects. More distal evaluation limited by respiratory motion artifact. Low volumes and atelectasis. Mosaic ground-glass towards the lung bases, could reflect further atelectatic volume loss versus underlying air trapping or small airways disease. No consolidative process. Mild pulmonary vascular congestion. No evidence of frank pulmonary edema. Right atrial enlargement. Three-vessel coronary artery atherosclerosis. Aortic Atherosclerosis (ICD10-I70.0). Osseous manifestations of diffuse idiopathic skeletal hyperostosis. Electronically Signed   By: Lovena Le M.D.   On: 03/05/2021 03:33   DG Chest Port 1 View  Result Date: 03/05/2021 CLINICAL DATA:  Hypotension EXAM: PORTABLE CHEST 1 VIEW COMPARISON:  Radiograph 02/08/2018 FINDINGS: Low volumes. Mild pulmonary vascular congestion. Some hazy opacities towards the lung base no pneumothorax or layering effusion. No consolidative process. The aorta is calcified. The remaining cardiomediastinal contours are unremarkable. Could reflect atelectasis and/or early interstitial edema with few peripheral septal lines. IMPRESSION: Low lung volumes. Streaky basilar opacities, may reflect atelectasis and/or early edema given some faint septal lines and vascular congestion. Aortic Atherosclerosis (ICD10-I70.0). Electronically Signed   By: Lovena Le M.D.   On: 03/05/2021 01:37   ECHOCARDIOGRAM COMPLETE  Result Date: 03/05/2021    ECHOCARDIOGRAM REPORT   Patient Name:   Kayla [name] Date of Exam: 03/05/2021 Medical Rec #:  [id number]       Height:       64.0 in Accession #:    [id number]      Weight:       229.9 lb Date of Birth:  1941/04/04        BSA:          2.076 m Patient Age:    44 years        BP:           155/62 mmHg Patient Gender: F               HR:           82 bpm. Exam Location:  Inpatient Procedure: 2D Echo, Cardiac Doppler and Color  Doppler Indications:    Atrial flutter with rapid ventricular response  History:        Patient has prior history of Echocardiogram examinations, most                 recent 11/15/2017. Risk Factors:Hypertension and Diabetes. Hx                 pulmonary embolus.  Sonographer:    Clayton Lefort RDCS (AE) Referring Phys: [id number] [place]  1. Left ventricular ejection fraction, by estimation, is 55 to 60%. The left ventricle has normal function. The left ventricle has no regional wall motion abnormalities. There is mild left ventricular hypertrophy. Left ventricular diastolic parameters are indeterminate.  2. Right ventricular systolic function is normal. The right ventricular size is normal.  There is mildly elevated pulmonary artery systolic pressure.  3. Mild mitral valve regurgitation.  4. The aortic valve is tricuspid. Aortic valve regurgitation is not visualized. Mild to moderate aortic valve sclerosis/calcification is present, without any evidence of aortic stenosis.  5. The inferior vena cava is normal in size with <50% respiratory variability, suggesting right atrial pressure of 8 mmHg. Comparison(s): The left ventricular function is unchanged. FINDINGS  Left Ventricle: Left ventricular ejection fraction, by estimation, is 55 to 60%. The left ventricle has normal function. The left ventricle has no regional wall motion abnormalities. The left ventricular internal cavity size was normal in size. There is  mild left ventricular hypertrophy. Left ventricular diastolic parameters are indeterminate. Right Ventricle: The right ventricular size is normal. Right vetricular wall thickness was not assessed. Right ventricular systolic function is normal. There is mildly elevated pulmonary artery systolic pressure. The tricuspid regurgitant velocity is 2.94 m/s, and with an assumed right atrial pressure of 8 mmHg, the estimated right ventricular systolic pressure is [id number]. Left Atrium: Left atrial size  was normal in size. Right Atrium: Right atrial size was normal in size. Pericardium: There is no evidence of pericardial effusion. Mitral Valve: There is mild thickening of the mitral valve leaflet(s). Mild mitral annular calcification. Mild mitral valve regurgitation. MV peak gradient, 3.8 mmHg. The mean mitral valve gradient is 2.0 mmHg. Tricuspid Valve: The tricuspid valve is normal in structure. Tricuspid valve regurgitation is mild. Aortic Valve: The aortic valve is tricuspid. Aortic valve regurgitation is not visualized. Mild to moderate aortic valve sclerosis/calcification is present, without any evidence of aortic stenosis. Aortic valve mean gradient measures 5.0 mmHg. Aortic valve peak gradient measures 8.8 mmHg. Aortic valve area, by VTI measures 2.52 cm. Pulmonic Valve: The pulmonic valve was not well visualized. Pulmonic valve regurgitation is not visualized. No evidence of pulmonic stenosis. Aorta: The aortic root and ascending aorta are structurally normal, with no evidence of dilitation. Venous: The inferior vena cava is normal in size with less than 50% respiratory variability, suggesting right atrial pressure of 8 mmHg. IAS/Shunts: No atrial level shunt detected by color flow Doppler.  LEFT VENTRICLE PLAX 2D LVIDd:         3.80 cm  Diastology LVIDs:         2.70 cm  LV e' medial:    7.18 cm/s LV PW:         1.40 cm  LV E/e' medial:  11.8 LV IVS:        1.50 cm  LV e' lateral:   8.38 cm/s LVOT diam:     2.10 cm  LV E/e' lateral: 10.1 LV SV:         77 LV SV Index:   37 LVOT Area:     3.46 cm  RIGHT VENTRICLE             IVC RV Basal diam:  3.10 cm     IVC diam: 1.70 cm RV S prime:     19.90 cm/s TAPSE (M-mode): 2.0 cm LEFT ATRIUM             Index       RIGHT ATRIUM           Index LA diam:        3.40 cm 1.64 cm/m  RA Area:     14.70 cm LA Vol (A2C):   32.6 ml 15.70 ml/m RA Volume:   35.20 ml  16.96 ml/m LA Vol (A4C):  49.1 ml 23.65 ml/m LA Biplane Vol: 39.8 ml 19.17 ml/m  AORTIC VALVE AV  Area (Vmax):    2.39 cm AV Area (Vmean):   2.60 cm AV Area (VTI):     2.52 cm AV Vmax:           148.00 cm/s AV Vmean:          101.000 cm/s AV VTI:            0.306 m AV Peak Grad:      8.8 mmHg AV Mean Grad:      5.0 mmHg LVOT Vmax:         102.00 cm/s LVOT Vmean:        75.700 cm/s LVOT VTI:          0.223 m LVOT/AV VTI ratio: 0.73  AORTA Ao Root diam: 2.80 cm Ao Asc diam:  3.00 cm MITRAL VALVE                TRICUSPID VALVE MV Area (PHT): 3.03 cm     TR Peak grad:   34.6 mmHg MV Area VTI:   3.16 cm     TR Vmax:        294.00 cm/s MV Peak grad:  3.8 mmHg MV Mean grad:  2.0 mmHg     SHUNTS MV Vmax:       0.98 m/s     Systemic VTI:  0.22 m MV Vmean:      62.7 cm/s    Systemic Diam: 2.10 cm MV Decel Time: 250 msec MV E velocity: 84.90 cm/s MV A velocity: 111.00 cm/s MV E/A ratio:  0.76 Dorris Carnes MD Electronically signed by Dorris Carnes MD Signature Date/Time: 03/05/2021/5:08:43 PM    Final       DVT prophylaxis: Apixaban  Code Status: Full code  Family Communication: No family at bedside   Consultants: Cardiology  Procedures:     Objective    Physical Examination:   General-appears in no acute distress Heart-S1-S2, regular, no murmur auscultated Lungs-clear to auscultation bilaterally, no wheezing or crackles auscultated Abdomen-soft, nontender, no organomegaly Extremities-trace edema in the lower extremities Neuro-alert, oriented x3, no focal deficit noted  Status is: Inpatient  Dispo: The patient is from: Home              Anticipated d/c is to: Home              Anticipated d/c date is: 03/07/2021              Patient currently not stable for discharge  Barrier to discharge-awaiting results for stress test  COVID-19 Labs  No results for input(s): DDIMER, FERRITIN, LDH, CRP in the last 72 hours.  Lab Results  Component Value Date   [place] NEGATIVE 03/05/2021    Microbiology  Recent Results (from the past 240 hour(s))  Resp Panel by RT-PCR (Flu A&B, Covid)  Nasopharyngeal Swab     Status: None   Collection Time: 03/05/21  6:46 AM   Specimen: Nasopharyngeal Swab; Nasopharyngeal(NP) swabs in vial transport medium  Result Value Ref Range Status   SARS Coronavirus 2 by RT PCR NEGATIVE NEGATIVE Final    Comment: (NOTE) SARS-CoV-2 target nucleic acids are NOT DETECTED.  The SARS-CoV-2 RNA is generally detectable in upper respiratory specimens during the acute phase of infection. The lowest concentration of SARS-CoV-2 viral copies this assay can detect is 138 copies/mL. A negative result does not preclude SARS-Cov-2 infection and should not be used as  the sole basis for treatment or other patient management decisions. A negative result may occur with  improper specimen collection/handling, submission of specimen other than nasopharyngeal swab, presence of viral mutation(s) within the areas targeted by this assay, and inadequate number of viral copies(<138 copies/mL). A negative result must be combined with clinical observations, patient history, and epidemiological information. The expected result is Negative.  Fact Sheet for Patients:  EntrepreneurPulse.com.au  Fact Sheet for Healthcare Providers:  IncredibleEmployment.be  This test is no t yet approved or cleared by the [place] FDA and  has been authorized for detection and/or diagnosis of SARS-CoV-2 by FDA under an Emergency Use Authorization (EUA). This EUA will remain  in effect (meaning this test can be used) for the duration of the COVID-19 declaration under Section 564(b)(1) of the Act, 21 U.S.C.section 360bbb-3(b)(1), unless the authorization is terminated  or revoked sooner.       Influenza A by PCR NEGATIVE NEGATIVE Final   Influenza B by PCR NEGATIVE NEGATIVE Final    Comment: (NOTE) The Xpert Xpress SARS-CoV-2/FLU/RSV plus assay is intended as an aid in the diagnosis of influenza from Nasopharyngeal swab specimens and should not be  used as a sole basis for treatment. Nasal washings and aspirates are unacceptable for Xpert Xpress SARS-CoV-2/FLU/RSV testing.  Fact Sheet for Patients: EntrepreneurPulse.com.au  Fact Sheet for Healthcare Providers: IncredibleEmployment.be  This test is not yet approved or cleared by the [place] FDA and has been authorized for detection and/or diagnosis of SARS-CoV-2 by FDA under an Emergency Use Authorization (EUA). This EUA will remain in effect (meaning this test can be used) for the duration of the COVID-19 declaration under Section 564(b)(1) of the Act, 21 U.S.C. section 360bbb-3(b)(1), unless the authorization is terminated or revoked.  Performed at [facility], [place] [address], [place], [place] [address]           [facility] If 7PM-7AM, please contact night-coverage at [email or web address], Office  [phone]   03/06/2021, 1:15 PM  LOS: 0 days

## 2021-03-07 ENCOUNTER — Other Ambulatory Visit (HOSPITAL_COMMUNITY): Payer: Self-pay

## 2021-03-07 ENCOUNTER — Inpatient Hospital Stay (HOSPITAL_COMMUNITY): Payer: Medicare PPO

## 2021-03-07 DIAGNOSIS — R079 Chest pain, unspecified: Secondary | ICD-10-CM

## 2021-03-07 DIAGNOSIS — I4892 Unspecified atrial flutter: Secondary | ICD-10-CM | POA: Diagnosis not present

## 2021-03-07 LAB — COMPREHENSIVE METABOLIC PANEL
ALT: 16 U/L (ref 0–44)
AST: 17 U/L (ref 15–41)
Albumin: 2.5 g/dL — ABNORMAL LOW (ref 3.5–5.0)
Alkaline Phosphatase: 80 U/L (ref 38–126)
Anion gap: 5 (ref 5–15)
BUN: 18 mg/dL (ref 8–23)
CO2: 25 mmol/L (ref 22–32)
Calcium: 8.5 mg/dL — ABNORMAL LOW (ref 8.9–10.3)
Chloride: 104 mmol/L (ref 98–111)
Creatinine, Ser: 0.88 mg/dL (ref 0.44–1.00)
GFR, Estimated: >60 mL/min (ref >60)
Glucose, Bld: 122 mg/dL — ABNORMAL HIGH (ref 70–99)
Potassium: 4.3 mmol/L (ref 3.5–5.1)
Sodium: 134 mmol/L — ABNORMAL LOW (ref 135–145)
Total Bilirubin: 0.6 mg/dL (ref 0.3–1.2)
Total Protein: 6.1 g/dL — ABNORMAL LOW (ref 6.5–8.1)

## 2021-03-07 LAB — CBC
HCT: 28.9 % — ABNORMAL LOW (ref 36.0–46.0)
Hemoglobin: 9.3 g/dL — ABNORMAL LOW (ref 12.0–15.0)
MCH: 26.3 pg (ref 26.0–34.0)
MCHC: 32.2 g/dL (ref 30.0–36.0)
MCV: 81.6 fL (ref 80.0–100.0)
Platelets: 228 10*3/uL (ref 150–400)
RBC: 3.54 MIL/uL — ABNORMAL LOW (ref 3.87–5.11)
RDW: 14.5 % (ref 11.5–15.5)
WBC: 5.5 10*3/uL (ref 4.0–10.5)
nRBC: 0 % (ref 0.0–0.2)

## 2021-03-07 LAB — GLUCOSE, CAPILLARY
Glucose-Capillary: 128 mg/dL — ABNORMAL HIGH (ref 70–99)
Glucose-Capillary: 112 mg/dL — ABNORMAL HIGH (ref 70–99)
Glucose-Capillary: 101 mg/dL — ABNORMAL HIGH (ref 70–99)
Glucose-Capillary: 143 mg/dL — ABNORMAL HIGH (ref 70–99)

## 2021-03-07 MED ORDER — IOHEXOL 350 MG/ML SOLN
100.0000 mL | Freq: Once | INTRAVENOUS | Status: AC | PRN
  Administered 2021-03-07: 100 mL via INTRAVENOUS

## 2021-03-07 MED ORDER — APIXABAN 5 MG PO TABS
5.0000 mg | ORAL_TABLET | Freq: Two times a day (BID) | ORAL | Status: DC
  Administered 2021-03-07 – 2021-03-15 (×16): 5 mg via ORAL
  Filled 2021-03-07 (×16): qty 1

## 2021-03-07 MED ORDER — METOPROLOL TARTRATE 100 MG PO TABS
100.0000 mg | ORAL_TABLET | Freq: Once | ORAL | Status: AC
  Administered 2021-03-07: 100 mg via ORAL
  Filled 2021-03-07: qty 1

## 2021-03-07 MED ORDER — METOPROLOL TARTRATE 5 MG/5ML IV SOLN
INTRAVENOUS | Status: AC
  Administered 2021-03-07: 5 mg
  Filled 2021-03-07: qty 5

## 2021-03-07 MED ORDER — NITROGLYCERIN 0.4 MG SL SUBL
SUBLINGUAL_TABLET | SUBLINGUAL | Status: AC
  Administered 2021-03-07: 0.8 mg
  Filled 2021-03-07: qty 2

## 2021-03-07 MED ORDER — FUROSEMIDE 40 MG PO TABS
40.0000 mg | ORAL_TABLET | Freq: Every day | ORAL | Status: DC
  Administered 2021-03-07 – 2021-03-09 (×3): 40 mg via ORAL
  Filled 2021-03-07 (×3): qty 1

## 2021-03-07 NOTE — TOC Benefit Eligibility Note (Signed)
Patient Advocate Encounter   Insurance verification completed.     The patient is currently admitted and upon discharge could be taking Eliquis 5 mg.   The current 30 day co-pay is, $40.00.    The patient is insured through [employer] Part D        Noelene Gang, CPhT Pharmacy Patient Advocate Specialist [facility] Direct Number: [phone]  Fax: [phone]   

## 2021-03-07 NOTE — Progress Notes (Signed)
[facility]  PROGRESS NOTE  Kayla [name] [id number] DOB: Oct 22, 1940 DOA: 03/04/2021 PCP: Denita Lung, MD   Brief HPI:    12 female with a history of diabetes mellitus type 2, hypertension, history of PE not on anticoagulation, obesity, chronic venous insufficiency, lymphedema, chronic right lower extremity wound followed by wound care came to ED for evaluation of palpitation and fatigue.  Patient was found to be new onset atrial flutter with 2 is to 1 block with RVR.  Patient was hypotensive in the ED with SBP in 80s.  She underwent urgent cardioversion in the ED.  Cardiology was consulted.   Subjective   Patient seen and examined, denies chest pain or shortness of breath.  Plan for coronary CT today.   Assessment/Plan:     New onset atrial flutter 2:1 block -Presented with atrial flutter with heart rate 150s -Was hypotensive on presentation so underwent urgent cardioversion, converted to normal sinus rhythm -CHA2DS2VASc score is 6 -Started on Eliquis per pharmacy -Eliquis on hold today and started on heparin per cardiology  Elevated troponin -Likely demand ischemia from A. fib with RVR -Nuclear Myoview stress test showed intermediate risk study -Cardiology planning to do CCTA today  Acute kidney injury -Likely from hypotension/losartan and HCTZ -Losartan HCTZ on hold -Renal function back to baseline -Dose of Eliquis was reduced to 2.5 mg p.o. twice daily due to renal insufficiency on 03/05/2021, changed back to 5 mg p.o. twice daily from 03/06/2021.   Pulmonary vascular congestion -CTA chest was negative for PE, shows pulmonary vascular congestion without pulmonary edema. -Also has trace bilateral lower extremity edema -Diuretics per cardiology  Diabetes mellitus type 2 -Hemoglobin A1c 6.7 -Continue sliding scale insulin with NovoLog -CBG well controlled  Hypertension -Blood pressure improved after cardioversion -Blood pressure is now elevated -Continue  Coreg -We will restart losartan at low-dose of 50 mg daily -Continue to hold HCTZ due to renal insufficiency  Scheduled medications:    atorvastatin  80 mg Oral Daily   carvedilol  25 mg Oral BID WC   insulin aspart  0-5 Units Subcutaneous QHS   insulin aspart  0-9 Units Subcutaneous TID WC   losartan  50 mg Oral Daily         Data Reviewed:   CBG:  Recent Labs  Lab 03/06/21 1250 03/06/21 1630 03/06/21 2101 03/07/21 0605 03/07/21 1125  GLUCAP 103* 171* 174* 128* 112*    SpO2: 98 %    Vitals:   03/06/21 1957 03/07/21 0456 03/07/21 0500 03/07/21 1124  BP: (!) 141/67 (!) 163/78  (!) 147/65  Pulse: 83 85  66  Resp: '19 18  19  '$ Temp: 98.3 F (36.8 C) 98.1 F (36.7 C)  98.7 F (37.1 C)  TempSrc: Oral Oral  Oral  SpO2: 97% 98%  98%  Weight:   105.2 kg   Height:         Intake/Output Summary (Last 24 hours) at 03/07/2021 1208 Last data filed at 03/07/2021 0800 Gross per 24 hour  Intake 777 ml  Output 1451 ml  Net -674 ml    08/06 1901 - 08/08 0700 In: 1017 [P.O.:1017] Out: 2827 [Urine:2826]  Filed Weights   03/05/21 1300 03/06/21 0500 03/07/21 0500  Weight: 104.3 kg 105.5 kg 105.2 kg    CBC:  Recent Labs  Lab 03/04/21 2353 03/06/21 0304 03/07/21 0237  WBC 5.3 4.8 5.5  HGB 9.6* 9.4* 9.3*  HCT 31.4* 29.8* 28.9*  PLT 237 237 228  MCV 85.3 82.1  81.6  MCH 26.1 25.9* 26.3  MCHC 30.6 31.5 32.2  RDW 14.7 14.6 14.5    Complete metabolic panel:  Recent Labs  Lab 03/04/21 2353 03/05/21 0701 03/06/21 0304 03/07/21 0237  NA 138  --  136 134*  K 4.2  --  4.2 4.3  CL 107  --  105 104  CO2 21*  --  24 25  GLUCOSE 163*  --  119* 122*  BUN 32*  --  22 18  CREATININE 1.62*  --  0.91 0.88  CALCIUM 8.8*  --  8.7* 8.5*  AST 31  --   --  17  ALT 19  --   --  16  ALKPHOS 90  --   --  80  BILITOT 0.5  --   --  0.6  ALBUMIN 3.2*  --   --  2.5*  MG 1.9  --   --   --   TSH  --  1.698  --   --   HGBA1C  --  6.7*  --   --   BNP 24.5  --   --   --      No results for input(s): LIPASE, AMYLASE in the last 168 hours.  Recent Labs  Lab 03/04/21 2353 03/05/21 0646  BNP 24.5  --   SARSCOV2NAA  --  NEGATIVE    ------------------------------------------------------------------------------------------------------------------ Recent Labs    03/05/21 0701  CHOL 219*  HDL 45  LDLCALC 160*  TRIG 69  CHOLHDL 4.9    Lab Results  Component Value Date   HGBA1C 6.7 (H) 03/05/2021   ------------------------------------------------------------------------------------------------------------------ Recent Labs    03/05/21 0701  TSH 1.698   ------------------------------------------------------------------------------------------------------------------ No results for input(s): VITAMINB12, FOLATE, FERRITIN, TIBC, IRON, RETICCTPCT in the last 72 hours.  Coagulation profile No results for input(s): INR, PROTIME in the last 168 hours. No results for input(s): DDIMER in the last 72 hours.  Cardiac Enzymes No results for input(s): CKTOTAL, CKMB, CKMBINDEX, TROPONINI in the last 168 hours.  ------------------------------------------------------------------------------------------------------------------    Component Value Date/Time   BNP 24.5 03/04/2021 2353     Antibiotics: Anti-infectives (From admission, onward)    None        Radiology Reports  NM Myocar Multi W/Spect W/Wall Motion / EF  Result Date: 03/06/2021  There was no ST segment deviation noted during stress.  Findings consistent with ischemia.  This is an intermediate risk study.  The left ventricular ejection fraction is hyperdynamic (>65%).  Nuclear stress EF: 66%.  There is a medium size defect of mild severity present in the basal anterior, mid anterior, apical anterior and apex location. Grossly normal wall motion. LVEF 66%. Findings suggest ischemia in LAD distribution. Intermediate risk study due to TID 1.58 and medium size defect.   CT CORONARY MORPH  W/CTA COR W/SCORE W/CA W/CM &/OR WO/CM  Result Date: 03/07/2021 EXAM: OVER-READ INTERPRETATION  CT CHEST The following report is an over-read performed by radiologist Dr. Markus Daft of [facility], [place] on 03/07/2021. This over-read does not include interpretation of cardiac or coronary anatomy or pathology. The coronary calcium score/coronary CTA interpretation by the cardiologist is attached. COMPARISON:  Chest CTA 03/05/2021 and chest CT 11/25/2016 FINDINGS: Vascular: Visualized pulmonary arteries are patent. Atherosclerotic calcifications involving the thoracic aorta. Mediastinum/Nodes: Visualized mediastinal structures are normal. Lungs/Pleura: No large pleural effusions. Peripheral patchy densities in lower lungs are suggestive atelectasis or scarring. No large areas of consolidation. No large pleural effusions. 3 mm nodular density in the left  lower lobe on sequence 11, image 18 is along the left major fissure. This nodule appears to be stable since 2018 and most compatible with a benign nodule. Upper Abdomen: Images of the upper abdomen are unremarkable. Musculoskeletal: Bridging osteophytes in the visualized thoracic spine. No acute bone abnormality. IMPRESSION: No acute abnormality involving the extracardiac structures. Aortic Atherosclerosis (ICD10-I70.0). Electronically Signed   By: Markus Daft M.D.   On: 03/07/2021 11:03   ECHOCARDIOGRAM COMPLETE  Result Date: 03/05/2021    ECHOCARDIOGRAM REPORT   Patient Name:   Kayla [name] Date of Exam: 03/05/2021 Medical Rec #:  [id number]       Height:       64.0 in Accession #:    [id number]      Weight:       229.9 lb Date of Birth:  Mar 22, 1941        BSA:          2.076 m Patient Age:    80 years        BP:           155/62 mmHg Patient Gender: F               HR:           82 bpm. Exam Location:  Inpatient Procedure: 2D Echo, Cardiac Doppler and Color Doppler Indications:    Atrial flutter with rapid ventricular response  History:        Patient has prior  history of Echocardiogram examinations, most                 recent 11/15/2017. Risk Factors:Hypertension and Diabetes. Hx                 pulmonary embolus.  Sonographer:    Clayton Lefort RDCS (AE) Referring Phys: [id number] [place]  1. Left ventricular ejection fraction, by estimation, is 55 to 60%. The left ventricle has normal function. The left ventricle has no regional wall motion abnormalities. There is mild left ventricular hypertrophy. Left ventricular diastolic parameters are indeterminate.  2. Right ventricular systolic function is normal. The right ventricular size is normal. There is mildly elevated pulmonary artery systolic pressure.  3. Mild mitral valve regurgitation.  4. The aortic valve is tricuspid. Aortic valve regurgitation is not visualized. Mild to moderate aortic valve sclerosis/calcification is present, without any evidence of aortic stenosis.  5. The inferior vena cava is normal in size with <50% respiratory variability, suggesting right atrial pressure of 8 mmHg. Comparison(s): The left ventricular function is unchanged. FINDINGS  Left Ventricle: Left ventricular ejection fraction, by estimation, is 55 to 60%. The left ventricle has normal function. The left ventricle has no regional wall motion abnormalities. The left ventricular internal cavity size was normal in size. There is  mild left ventricular hypertrophy. Left ventricular diastolic parameters are indeterminate. Right Ventricle: The right ventricular size is normal. Right vetricular wall thickness was not assessed. Right ventricular systolic function is normal. There is mildly elevated pulmonary artery systolic pressure. The tricuspid regurgitant velocity is 2.94 m/s, and with an assumed right atrial pressure of 8 mmHg, the estimated right ventricular systolic pressure is [id number]. Left Atrium: Left atrial size was normal in size. Right Atrium: Right atrial size was normal in size. Pericardium: There is no evidence  of pericardial effusion. Mitral Valve: There is mild thickening of the mitral valve leaflet(s). Mild mitral annular calcification. Mild mitral valve regurgitation. MV peak gradient, 3.8 mmHg. The mean mitral  valve gradient is 2.0 mmHg. Tricuspid Valve: The tricuspid valve is normal in structure. Tricuspid valve regurgitation is mild. Aortic Valve: The aortic valve is tricuspid. Aortic valve regurgitation is not visualized. Mild to moderate aortic valve sclerosis/calcification is present, without any evidence of aortic stenosis. Aortic valve mean gradient measures 5.0 mmHg. Aortic valve peak gradient measures 8.8 mmHg. Aortic valve area, by VTI measures 2.52 cm. Pulmonic Valve: The pulmonic valve was not well visualized. Pulmonic valve regurgitation is not visualized. No evidence of pulmonic stenosis. Aorta: The aortic root and ascending aorta are structurally normal, with no evidence of dilitation. Venous: The inferior vena cava is normal in size with less than 50% respiratory variability, suggesting right atrial pressure of 8 mmHg. IAS/Shunts: No atrial level shunt detected by color flow Doppler.  LEFT VENTRICLE PLAX 2D LVIDd:         3.80 cm  Diastology LVIDs:         2.70 cm  LV e' medial:    7.18 cm/s LV PW:         1.40 cm  LV E/e' medial:  11.8 LV IVS:        1.50 cm  LV e' lateral:   8.38 cm/s LVOT diam:     2.10 cm  LV E/e' lateral: 10.1 LV SV:         77 LV SV Index:   37 LVOT Area:     3.46 cm  RIGHT VENTRICLE             IVC RV Basal diam:  3.10 cm     IVC diam: 1.70 cm RV S prime:     19.90 cm/s TAPSE (M-mode): 2.0 cm LEFT ATRIUM             Index       RIGHT ATRIUM           Index LA diam:        3.40 cm 1.64 cm/m  RA Area:     14.70 cm LA Vol (A2C):   32.6 ml 15.70 ml/m RA Volume:   35.20 ml  16.96 ml/m LA Vol (A4C):   49.1 ml 23.65 ml/m LA Biplane Vol: 39.8 ml 19.17 ml/m  AORTIC VALVE AV Area (Vmax):    2.39 cm AV Area (Vmean):   2.60 cm AV Area (VTI):     2.52 cm AV Vmax:           148.00  cm/s AV Vmean:          101.000 cm/s AV VTI:            0.306 m AV Peak Grad:      8.8 mmHg AV Mean Grad:      5.0 mmHg LVOT Vmax:         102.00 cm/s LVOT Vmean:        75.700 cm/s LVOT VTI:          0.223 m LVOT/AV VTI ratio: 0.73  AORTA Ao Root diam: 2.80 cm Ao Asc diam:  3.00 cm MITRAL VALVE                TRICUSPID VALVE MV Area (PHT): 3.03 cm     TR Peak grad:   34.6 mmHg MV Area VTI:   3.16 cm     TR Vmax:        294.00 cm/s MV Peak grad:  3.8 mmHg MV Mean grad:  2.0 mmHg     SHUNTS MV Vmax:  0.98 m/s     Systemic VTI:  0.22 m MV Vmean:      62.7 cm/s    Systemic Diam: 2.10 cm MV Decel Time: 250 msec MV E velocity: 84.90 cm/s MV A velocity: 111.00 cm/s MV E/A ratio:  0.76 Dorris Carnes MD Electronically signed by Dorris Carnes MD Signature Date/Time: 03/05/2021/5:08:43 PM    Final       DVT prophylaxis: Apixaban  Code Status: Full code  Family Communication: No family at bedside   Consultants: Cardiology  Procedures:     Objective    Physical Examination:   General-appears in no acute distress Heart-S1-S2, regular, no murmur auscultated Lungs-clear to auscultation bilaterally, no wheezing or crackles auscultated Abdomen-soft, nontender, no organomegaly Extremities-no edema in the lower extremities Neuro-alert, oriented x3, no focal deficit noted  Status is: Inpatient  Dispo: The patient is from: Home              Anticipated d/c is to: Home              Anticipated d/c date is: 03/08/2021              Patient currently not stable for discharge  Barrier to discharge-awaiting results for stress test  COVID-19 Labs  No results for input(s): DDIMER, FERRITIN, LDH, CRP in the last 72 hours.  Lab Results  Component Value Date   [place] NEGATIVE 03/05/2021    Microbiology  Recent Results (from the past 240 hour(s))  Resp Panel by RT-PCR (Flu A&B, Covid) Nasopharyngeal Swab     Status: None   Collection Time: 03/05/21  6:46 AM   Specimen: Nasopharyngeal Swab;  Nasopharyngeal(NP) swabs in vial transport medium  Result Value Ref Range Status   SARS Coronavirus 2 by RT PCR NEGATIVE NEGATIVE Final    Comment: (NOTE) SARS-CoV-2 target nucleic acids are NOT DETECTED.  The SARS-CoV-2 RNA is generally detectable in upper respiratory specimens during the acute phase of infection. The lowest concentration of SARS-CoV-2 viral copies this assay can detect is 138 copies/mL. A negative result does not preclude SARS-Cov-2 infection and should not be used as the sole basis for treatment or other patient management decisions. A negative result may occur with  improper specimen collection/handling, submission of specimen other than nasopharyngeal swab, presence of viral mutation(s) within the areas targeted by this assay, and inadequate number of viral copies(<138 copies/mL). A negative result must be combined with clinical observations, patient history, and epidemiological information. The expected result is Negative.  Fact Sheet for Patients:  EntrepreneurPulse.com.au  Fact Sheet for Healthcare Providers:  IncredibleEmployment.be  This test is no t yet approved or cleared by the [place] FDA and  has been authorized for detection and/or diagnosis of SARS-CoV-2 by FDA under an Emergency Use Authorization (EUA). This EUA will remain  in effect (meaning this test can be used) for the duration of the COVID-19 declaration under Section 564(b)(1) of the Act, 21 U.S.C.section 360bbb-3(b)(1), unless the authorization is terminated  or revoked sooner.       Influenza A by PCR NEGATIVE NEGATIVE Final   Influenza B by PCR NEGATIVE NEGATIVE Final    Comment: (NOTE) The Xpert Xpress SARS-CoV-2/FLU/RSV plus assay is intended as an aid in the diagnosis of influenza from Nasopharyngeal swab specimens and should not be used as a sole basis for treatment. Nasal washings and aspirates are unacceptable for Xpert Xpress  SARS-CoV-2/FLU/RSV testing.  Fact Sheet for Patients: EntrepreneurPulse.com.au  Fact Sheet for Healthcare Providers: IncredibleEmployment.be  This  test is not yet approved or cleared by the [place] and has been authorized for detection and/or diagnosis of SARS-CoV-2 by FDA under an Emergency Use Authorization (EUA). This EUA will remain in effect (meaning this test can be used) for the duration of the COVID-19 declaration under Section 564(b)(1) of the Act, 21 U.S.C. section 360bbb-3(b)(1), unless the authorization is terminated or revoked.  Performed at [facility], [place] [address] [place], [place] [address]           [facility] If 7PM-7AM, please contact night-coverage at [email or web address], Office  [phone]   03/07/2021, 12:08 PM  LOS: 1 day

## 2021-03-07 NOTE — Progress Notes (Addendum)
Progress Note  Patient Name: Kayla [name] Date of Encounter: 03/07/2021  Primary Cardiologist: Elouise Munroe, MD   Subjective   Feels well presently.  Notes that at baseline she takes care of her ADLS by her self and still cooks for her sons when the come home.  Notes that she has not had CP but was felt night sweats and weakness for the past weeks.  Notes bruising with eliquis but no other bleeding  Inpatient Medications    Scheduled Meds:  atorvastatin  80 mg Oral Daily   carvedilol  25 mg Oral BID WC   insulin aspart  0-5 Units Subcutaneous QHS   insulin aspart  0-9 Units Subcutaneous TID WC   losartan  50 mg Oral Daily   Continuous Infusions:  PRN Meds: acetaminophen **OR** acetaminophen   Vital Signs    Vitals:   03/06/21 1629 03/06/21 1957 03/07/21 0456 03/07/21 0500  BP: (!) 153/58 (!) 141/67 (!) 163/78   Pulse: 89 83 85   Resp: '18 19 18   '$ Temp: 98.3 F (36.8 C) 98.3 F (36.8 C) 98.1 F (36.7 C)   TempSrc: Oral Oral Oral   SpO2: 98% 97% 98%   Weight:    105.2 kg  Height:        Intake/Output Summary (Last 24 hours) at 03/07/2021 0836 Last data filed at 03/07/2021 0800 Gross per 24 hour  Intake 777 ml  Output 1452 ml  Net -675 ml   Filed Weights   03/05/21 1300 03/06/21 0500 03/07/21 0500  Weight: 104.3 kg 105.5 kg 105.2 kg    Telemetry    SR - Personally Reviewed  ECG    SR rate 78 abnormal R wave progression (V2 > V3) - Personally Reviewed  Physical Exam   GEN: Obese female Neck: No JVD, but thick neck Cardiac: RRR, no murmurs, rubs, or gallops, distant heart sounds Respiratory: Diffuse wheezes in bases; good air movement GI: Soft, nontender, non-distended  MS: non-pitting edema worse in in ankles Neuro:  Nonfocal  Psych: Normal affect   Labs    Chemistry Recent Labs  Lab 03/04/21 2353 03/06/21 0304 03/07/21 0237  NA 138 136 134*  K 4.2 4.2 4.3  CL 107 105 104  CO2 21* 24 25  GLUCOSE 163* 119* 122*  BUN 32* 22 18   CREATININE 1.62* 0.91 0.88  CALCIUM 8.8* 8.7* 8.5*  PROT 6.9  --  6.1*  ALBUMIN 3.2*  --  2.5*  AST 31  --  17  ALT 19  --  16  ALKPHOS 90  --  80  BILITOT 0.5  --  0.6  GFRNONAA 32* >60 >60  ANIONGAP '10 7 5     '$ Hematology Recent Labs  Lab 03/04/21 2353 03/06/21 0304 03/07/21 0237  WBC 5.3 4.8 5.5  RBC 3.68* 3.63* 3.54*  HGB 9.6* 9.4* 9.3*  HCT 31.4* 29.8* 28.9*  MCV 85.3 82.1 81.6  MCH 26.1 25.9* 26.3  MCHC 30.6 31.5 32.2  RDW 14.7 14.6 14.5  PLT 237 237 228    Cardiac EnzymesNo results for input(s): TROPONINI in the last 168 hours. No results for input(s): TROPIPOC in the last 168 hours.   BNP Recent Labs  Lab 03/04/21 2353  BNP 24.5     DDimer No results for input(s): DDIMER in the last 168 hours.   Radiology    NM Myocar Multi W/Spect W/Wall Motion / EF  Result Date: 03/06/2021  There was no ST segment deviation noted  during stress.  Findings consistent with ischemia.  This is an intermediate risk study.  The left ventricular ejection fraction is hyperdynamic (>65%).  Nuclear stress EF: 66%.  There is a medium size defect of mild severity present in the basal anterior, mid anterior, apical anterior and apex location. Grossly normal wall motion. LVEF 66%. Findings suggest ischemia in LAD distribution. Intermediate risk study due to TID 1.58 and medium size defect.   ECHOCARDIOGRAM COMPLETE  Result Date: 03/05/2021    ECHOCARDIOGRAM REPORT   Patient Name:   Kayla [name] Date of Exam: 03/05/2021 Medical Rec #:  [id number]       Height:       64.0 in Accession #:    [id number]      Weight:       229.9 lb Date of Birth:  11/23/1940        BSA:          2.076 m Patient Age:    80 years        BP:           155/62 mmHg Patient Gender: F               HR:           82 bpm. Exam Location:  Inpatient Procedure: 2D Echo, Cardiac Doppler and Color Doppler Indications:    Atrial flutter with rapid ventricular response  History:        Patient has prior history of  Echocardiogram examinations, most                 recent 11/15/2017. Risk Factors:Hypertension and Diabetes. Hx                 pulmonary embolus.  Sonographer:    Clayton Lefort RDCS (AE) Referring Phys: [id number] [place]  1. Left ventricular ejection fraction, by estimation, is 55 to 60%. The left ventricle has normal function. The left ventricle has no regional wall motion abnormalities. There is mild left ventricular hypertrophy. Left ventricular diastolic parameters are indeterminate.  2. Right ventricular systolic function is normal. The right ventricular size is normal. There is mildly elevated pulmonary artery systolic pressure.  3. Mild mitral valve regurgitation.  4. The aortic valve is tricuspid. Aortic valve regurgitation is not visualized. Mild to moderate aortic valve sclerosis/calcification is present, without any evidence of aortic stenosis.  5. The inferior vena cava is normal in size with <50% respiratory variability, suggesting right atrial pressure of 8 mmHg. Comparison(s): The left ventricular function is unchanged. FINDINGS  Left Ventricle: Left ventricular ejection fraction, by estimation, is 55 to 60%. The left ventricle has normal function. The left ventricle has no regional wall motion abnormalities. The left ventricular internal cavity size was normal in size. There is  mild left ventricular hypertrophy. Left ventricular diastolic parameters are indeterminate. Right Ventricle: The right ventricular size is normal. Right vetricular wall thickness was not assessed. Right ventricular systolic function is normal. There is mildly elevated pulmonary artery systolic pressure. The tricuspid regurgitant velocity is 2.94 m/s, and with an assumed right atrial pressure of 8 mmHg, the estimated right ventricular systolic pressure is [id number]. Left Atrium: Left atrial size was normal in size. Right Atrium: Right atrial size was normal in size. Pericardium: There is no evidence of  pericardial effusion. Mitral Valve: There is mild thickening of the mitral valve leaflet(s). Mild mitral annular calcification. Mild mitral valve regurgitation. MV peak gradient, 3.8 mmHg. The mean mitral valve  gradient is 2.0 mmHg. Tricuspid Valve: The tricuspid valve is normal in structure. Tricuspid valve regurgitation is mild. Aortic Valve: The aortic valve is tricuspid. Aortic valve regurgitation is not visualized. Mild to moderate aortic valve sclerosis/calcification is present, without any evidence of aortic stenosis. Aortic valve mean gradient measures 5.0 mmHg. Aortic valve peak gradient measures 8.8 mmHg. Aortic valve area, by VTI measures 2.52 cm. Pulmonic Valve: The pulmonic valve was not well visualized. Pulmonic valve regurgitation is not visualized. No evidence of pulmonic stenosis. Aorta: The aortic root and ascending aorta are structurally normal, with no evidence of dilitation. Venous: The inferior vena cava is normal in size with less than 50% respiratory variability, suggesting right atrial pressure of 8 mmHg. IAS/Shunts: No atrial level shunt detected by color flow Doppler.  LEFT VENTRICLE PLAX 2D LVIDd:         3.80 cm  Diastology LVIDs:         2.70 cm  LV e' medial:    7.18 cm/s LV PW:         1.40 cm  LV E/e' medial:  11.8 LV IVS:        1.50 cm  LV e' lateral:   8.38 cm/s LVOT diam:     2.10 cm  LV E/e' lateral: 10.1 LV SV:         77 LV SV Index:   37 LVOT Area:     3.46 cm  RIGHT VENTRICLE             IVC RV Basal diam:  3.10 cm     IVC diam: 1.70 cm RV S prime:     19.90 cm/s TAPSE (M-mode): 2.0 cm LEFT ATRIUM             Index       RIGHT ATRIUM           Index LA diam:        3.40 cm 1.64 cm/m  RA Area:     14.70 cm LA Vol (A2C):   32.6 ml 15.70 ml/m RA Volume:   35.20 ml  16.96 ml/m LA Vol (A4C):   49.1 ml 23.65 ml/m LA Biplane Vol: 39.8 ml 19.17 ml/m  AORTIC VALVE AV Area (Vmax):    2.39 cm AV Area (Vmean):   2.60 cm AV Area (VTI):     2.52 cm AV Vmax:           148.00  cm/s AV Vmean:          101.000 cm/s AV VTI:            0.306 m AV Peak Grad:      8.8 mmHg AV Mean Grad:      5.0 mmHg LVOT Vmax:         102.00 cm/s LVOT Vmean:        75.700 cm/s LVOT VTI:          0.223 m LVOT/AV VTI ratio: 0.73  AORTA Ao Root diam: 2.80 cm Ao Asc diam:  3.00 cm MITRAL VALVE                TRICUSPID VALVE MV Area (PHT): 3.03 cm     TR Peak grad:   34.6 mmHg MV Area VTI:   3.16 cm     TR Vmax:        294.00 cm/s MV Peak grad:  3.8 mmHg MV Mean grad:  2.0 mmHg     SHUNTS MV Vmax:  0.98 m/s     Systemic VTI:  0.22 m MV Vmean:      62.7 cm/s    Systemic Diam: 2.10 cm MV Decel Time: 250 msec MV E velocity: 84.90 cm/s MV A velocity: 111.00 cm/s MV E/A ratio:  0.76 Dorris Carnes MD Electronically signed by Dorris Carnes MD Signature Date/Time: 03/05/2021/5:08:43 PM    Final     Cardiac Studies    CTPE: Date: 03/05/21 Results: No PE Aortic Atherosclerosis LM, LAD, mLCx, and p RCA CAC   Patient Profile     80 y.o. female Morbid Obesity, HTN and DM; history of PE and AFL on eliquis, chronic venous insufficiency and  R LE wound followed by wound care seen for NSTEMI; initially thought related to AFL RVR ( and s/p urgent cardioversion for hypotension 03/05/21)  Assessment & Plan    NSTEMI Aortic Atherosclerosis and CAC PAF with CHADSVASC 6 - will transition from [place] to heparin for today 03/07/21); if positive CCTA and NSTEMI we would consider LHC - continue coreg 25 mg PO BID (holding to get 100 mg PO metoprolol prior to CCTA) - continue losartan 50 mg PO Daily - If BP elevated once returned to Coreg, Imdur 30 mg PO Daily or Coreg 50 mg PO BID can be considered based on heart rate - LDL 160 on atorvastatin 80 mg PO Daily; adding zetia 10 and outpatient may need PCSK9i - holding HCTZ pending possible LHC, possible transition to lasix peri-discharge  Presently, patient is unsure if she would want LHC vs medical management if she were to have obstructive lesion but it willing to  re-engage this discussion after results of CCTA  AFL Hx of PE - CHADSVASC 6 - AC as above  Chronic venous insufficiency R Wound Wheezes on exam - per primary; given possible LHC pending results of above cautious with use of diuretics - ordered incentive spirometry  Discussed at length with patient, discussed with pharmacy  For questions or updates, please contact [facility] Please consult [email or web address] for contact info under Cardiology/STEMI.      Signed, Werner Lean, MD  03/07/2021, 8:36 AM    Reviewed CT with [place] MD and reviewed images personally:  moderate coronary artery stenosis in the setting of phase misregistration and poor rate control.  Offered patient LHC:  she is worried about the risks including emergent surgery, bleeding, death and stroke.  Has no CP now. Would like to treat conservatively. Offered to place her on the schedule while she discussed it with her family, but she is adamant she will not change her mind. - Will optimized coreg, return eliquis for nightly dose, and start low dose diuretic - discussed with primary MD and nursing team as well.  Rudean Haskell, MD [place], [address] [place], [place] [address] [phone]  2:24 PM

## 2021-03-07 NOTE — Progress Notes (Signed)
   03/07/21 1017  Mobility  Activity Off unit (at procedure; will check back)

## 2021-03-07 NOTE — Progress Notes (Signed)
Offered pt to get out of bed to chair or sit on the EOB but refused, states she is in pain and does not want to get up here, offered pt pain meds but refused, MD at bedside.

## 2021-03-07 NOTE — Progress Notes (Signed)
ANTICOAGULATION CONSULT NOTE - Initial Consult  Pharmacy Consult for apixaban Indication: atrial fibrillation  No Known Allergies  Patient Measurements: Height: '5\' 4"'$  (162.6 cm) Weight: 105.2 kg (231 lb 14.8 oz) IBW/kg (Calculated) : 54.7     Vital Signs: Temp: 98.7 F (37.1 C) (08/08 1124) Temp Source: Oral (08/08 1124) BP: 147/65 (08/08 1124) Pulse Rate: 66 (08/08 1124)  Labs: Recent Labs    03/04/21 2353 03/05/21 0153 03/05/21 0701 03/06/21 0304 03/07/21 0237  HGB 9.6*  --   --  9.4* 9.3*  HCT 31.4*  --   --  29.8* 28.9*  PLT 237  --   --  237 228  CREATININE 1.62*  --   --  0.91 0.88  TROPONINIHS 116* 489* 1,322*  --   --     Estimated Creatinine Clearance: 58.3 mL/min (by C-G formula based on SCr of 0.88 mg/dL).   Medical History: Past Medical History:  Diagnosis Date   Arthritis    Diabetes mellitus    Hypertension    Obesity    Pulmonary embolus [facility])      Assessment: 80 yo W with NSTEMI and new Aflutter CHADS2VASc = 6. No anticoagulation prior to admission. Pharmacy consulted for apixaban.  Last dose apixaban was this morning. CTA with calcifications, possible obstructive CAD but patient refused LHC so ok to resume apixaban.  Goal of Therapy:  Monitor platelets by anticoagulation protocol: Yes   Plan:  Apixaban '5mg'$  BID  Monitor for signs/symptoms of bleeding  Patient education done    Benetta Spar, PharmD, BCPS, [facility] Clinical Pharmacist  Please check AMION for all [place] phone numbers After 10:00 PM, call [place] [phone]

## 2021-03-08 DIAGNOSIS — I4892 Unspecified atrial flutter: Secondary | ICD-10-CM | POA: Diagnosis not present

## 2021-03-08 LAB — GLUCOSE, CAPILLARY
Glucose-Capillary: 131 mg/dL — ABNORMAL HIGH (ref 70–99)
Glucose-Capillary: 156 mg/dL — ABNORMAL HIGH (ref 70–99)
Glucose-Capillary: 132 mg/dL — ABNORMAL HIGH (ref 70–99)
Glucose-Capillary: 164 mg/dL — ABNORMAL HIGH (ref 70–99)

## 2021-03-08 MED ORDER — EZETIMIBE 10 MG PO TABS
10.0000 mg | ORAL_TABLET | Freq: Every day | ORAL | Status: DC
  Administered 2021-03-08 – 2021-03-15 (×8): 10 mg via ORAL
  Filled 2021-03-08 (×8): qty 1

## 2021-03-08 NOTE — Code Documentation (Signed)
Called to patient's bedside d/t code blue alert.  Upon arrival to room patient is lying in the bed.  She is alert and complaining of being returned to the bed.  Per staff she is back at her baseline mental status.  Denies chest pain or shortness of breath.   Per RN she was alerted by telemetry that the patient's HR was 35, when she entered the room the patient was not responding to her while sitting in the chair.  She alerted her team and they called a code blue and assisted her to bed. Upon review of telemetry patient's HR has slowed from a rate of 70 to 30s over the past 30-45 min. RN to notify MD of event.

## 2021-03-08 NOTE — Plan of Care (Signed)

## 2021-03-08 NOTE — Progress Notes (Signed)
1215pm Staff RN paged reporting patient had an episode of syncope earlier, RRT was called. Per report, patient was sitting in chair, suddenly become unconscious, not responding to external stimuli, observed drooling, telemetry showed bradycardia with heart rate 35, blood pressure was normal.  Patient was transferred to bed, also lasted 1 to 2 minutes and patient woke up spontaneously.  Patient does not recall what happened, had no complaints of chest pain.  Patient was observed with incontinence of stool.  No seizure or shaking activity was observed . Patient reports that she was not straining prior to the episode.  Currently patient is stable with recovery of neuro exam and vital sign.  Staff questioning if carvedilol should be continued and if patient is still planned for LHC.   Chart reviewed: Patient is currently followed by cardiology for non-STEMI and a flutter RVR.  She had refused LHC, troponin elevation was felt due to demand ischemia, LHC is planned outpatient if patient agreeable in the future.  She is currently managed medically with carvedilol, losartan, atorvastatin, Zetia.   Subjective data: Patient feels fine and notes that she doesn't remember what had happened.  Nursing noted that she was on the monitor but she did have loss of bladder.  Physical exam: Regular bradycardia Improved crackles in the bases  Plan: - will obtain twelve-lead EKG - will discontinue carvedilol 25 mg twice daily, will like restart lower dose planned for 03/09/21 - continue telemetry monitor - query vasovagal syncope  Personally seen and examined. Agree with APP above with the following comments:  Patient felt back to normal, may have been vasosvagal syncope. Discussed case with son [name] at length.  He notes that he wants the best treatment for her, and doesn't wanted [place] to make the decision based on her solely on her wishes.  We discussed in collaboration medication management unless new issues  occur.  Time Spent Directly with Patient:   I have spent a total of 37 minutes discussing with son.  Rudean Haskell, MD [place], [address] [place], [place] [address] [phone]  2:17 PM

## 2021-03-08 NOTE — Progress Notes (Signed)
  Mobility Specialist Criteria Algorithm Info.  Mobility Team: [facility] elevated:Self regulated Activity: Transferred:  Bed to chair Range of motion: Passive; Left leg Level of assistance: Maximum assist, patient does 25-49% Assistive device: Front wheel walker; Other (Comment) (+ Gait belt) Minutes sitting in chair:  Minutes stood: 2 minutes Minutes ambulated: 0 minutes Distance ambulated (ft): 5 ft Mobility response: Tolerated poorly; RN notified Bed Position: Chair  Patient received lying supine in bed initially unwilling to participate in mobility secondary to pain and stiffness in LLE. Agreed after max encouragement and education on the importance of mobilization frequency.  Required maximal assistance + cues to EOB supine>sit. Demonstrated LE exercises and stretches with receptive teach back and performance. Required max assist + cues for hand placement on RW to stand from elevated surface. Was unable to take steps forward or backward, had to squat and stand pivot to chair instead. Tolerated transfer to chair poorly and would benefit from PT/OT evaluation. RN notified.  03/08/2021 3:59 PM

## 2021-03-08 NOTE — Progress Notes (Signed)
Progress Note  Patient Name: Kayla [name] Date of Encounter: 03/08/2021  Primary Cardiologist: Elouise Munroe, MD   Subjective   No events overnight.  Patient notes that she is hoping to speak with an elder in her church about [place].  Has not changed her mind about LAD eval, but is willing to hear more information.  Inpatient Medications    Scheduled Meds:  apixaban  5 mg Oral BID   atorvastatin  80 mg Oral Daily   carvedilol  25 mg Oral BID WC   furosemide  40 mg Oral Daily   insulin aspart  0-5 Units Subcutaneous QHS   insulin aspart  0-9 Units Subcutaneous TID WC   losartan  50 mg Oral Daily   Continuous Infusions:  PRN Meds: acetaminophen **OR** acetaminophen   Vital Signs    Vitals:   03/07/21 0500 03/07/21 1124 03/07/21 1927 03/08/21 0424  BP:  (!) 147/65 (!) 115/47 134/65  Pulse:  66 75 72  Resp:  '19 17 17  '$ Temp:  98.7 F (37.1 C) 98.8 F (37.1 C) 98.5 F (36.9 C)  TempSrc:  Oral Oral Oral  SpO2:  98% 96% 96%  Weight: 105.2 kg   104.9 kg  Height:        Intake/Output Summary (Last 24 hours) at 03/08/2021 0932 Last data filed at 03/08/2021 0900 Gross per 24 hour  Intake 1080 ml  Output 2025 ml  Net -945 ml   Filed Weights   03/06/21 0500 03/07/21 0500 03/08/21 0424  Weight: 105.5 kg 105.2 kg 104.9 kg    Telemetry    SR without return to AF- Personally Reviewed  ECG    No new  - Personally Reviewed  Physical Exam   GEN: Obese female Neck: No JVD, but thick neck Cardiac: RRR no murmurs rubs or gallobs Respiratory: Crackles in the bases but improved from prior. GI: Soft, nontender, non-distended  MS: non-pitting edema worse in in ankles Neuro:  Nonfocal  Psych: Normal affect   Labs    Chemistry Recent Labs  Lab 03/04/21 2353 03/06/21 0304 03/07/21 0237  NA 138 136 134*  K 4.2 4.2 4.3  CL 107 105 104  CO2 21* 24 25  GLUCOSE 163* 119* 122*  BUN 32* 22 18  CREATININE 1.62* 0.91 0.88  CALCIUM 8.8* 8.7* 8.5*  PROT 6.9  --   6.1*  ALBUMIN 3.2*  --  2.5*  AST 31  --  17  ALT 19  --  16  ALKPHOS 90  --  80  BILITOT 0.5  --  0.6  GFRNONAA 32* >60 >60  ANIONGAP '10 7 5     '$ Hematology Recent Labs  Lab 03/04/21 2353 03/06/21 0304 03/07/21 0237  WBC 5.3 4.8 5.5  RBC 3.68* 3.63* 3.54*  HGB 9.6* 9.4* 9.3*  HCT 31.4* 29.8* 28.9*  MCV 85.3 82.1 81.6  MCH 26.1 25.9* 26.3  MCHC 30.6 31.5 32.2  RDW 14.7 14.6 14.5  PLT 237 237 228    Cardiac EnzymesNo results for input(s): TROPONINI in the last 168 hours. No results for input(s): TROPIPOC in the last 168 hours.   BNP Recent Labs  Lab 03/04/21 2353  BNP 24.5     DDimer No results for input(s): DDIMER in the last 168 hours.   Radiology    NM Myocar Multi W/Spect W/Wall Motion / EF  Result Date: 03/06/2021  There was no ST segment deviation noted during stress.  Findings consistent with ischemia.  This  is an intermediate risk study.  The left ventricular ejection fraction is hyperdynamic (>65%).  Nuclear stress EF: 66%.  There is a medium size defect of mild severity present in the basal anterior, mid anterior, apical anterior and apex location. Grossly normal wall motion. LVEF 66%. Findings suggest ischemia in LAD distribution. Intermediate risk study due to TID 1.58 and medium size defect.   CT CORONARY MORPH W/CTA COR W/SCORE W/CA W/CM &/OR WO/CM  Addendum Date: 03/07/2021   ADDENDUM REPORT: 03/07/2021 12:51 CLINICAL DATA:  Chest pain EXAM: Cardiac/Coronary CTA TECHNIQUE: A non-contrast, gated CT scan was obtained with axial slices of 3 mm through the heart for calcium scoring. Calcium scoring was performed using the Agatston method. A 120 kV prospective, gated, contrast cardiac scan was obtained. Gantry rotation speed was 250 msecs and collimation was 0.6 mm. Two sublingual nitroglycerin tablets (0.8 mg) were given. The 3D data set was reconstructed in 5% intervals of the 35-75% of the R-R cycle. Diastolic phases were analyzed on a dedicated workstation  using MPR, MIP, and VRT modes. The patient received 95 cc of contrast. FINDINGS: Image quality: Poor quality scan with significant stair-step artifact. Irregular heart rhythm during scan. Noise artifact is: Limited. Coronary Arteries:  Normal coronary origin.  Left dominance. Left main: The left main is a large caliber vessel with a normal take off from the left coronary cusp that bifurcates to form a left anterior descending artery and a left circumflex artery. There is minimal calcified plaque (<25%). Left anterior descending artery: There are heavily calcifications of the proximal to mid LAD. There is stair step artifact in this region and cannot be overcome by multi-phase assessment. This segment is non-diagnostic, and cannot exclude significant stenosis. The distal LAD appears patent. Left circumflex artery: The LCX is dominant and appears patent with no evidence of plaque or stenosis. Significant stair step artifact noted. The LCX terminates as a patent PDA. OM1 is heavily calcified and contains at least a moderate (50-69%) calcified stenosis. Right coronary artery: The RCA is non-dominant with normal take off from the right coronary cusp. The ostial RCA is heavily calcified and cannot exclude a severe stenosis (70-99%). Right Atrium: Right atrial size is within normal limits. Right Ventricle: The right ventricular cavity is within normal limits. Left Atrium: Left atrial size is normal in size with no left atrial appendage filling defect. Left Ventricle: The ventricular cavity size is within normal limits. There are no stigmata of prior infarction. There is no abnormal filling defect. Pulmonary arteries: Normal in size without proximal filling defect. Pulmonary veins: Normal pulmonary venous drainage. Pericardium: Normal thickness with no significant effusion or calcium present. Cardiac valves: The aortic valve is trileaflet without significant calcification. The mitral valve is normal structure without  significant calcification. Aorta: Normal caliber with no significant disease. Extra-cardiac findings: See attached radiology report for non-cardiac structures. IMPRESSION: 1. Coronary calcium score of 620. This was 89th percentile for age-, sex, and race-matched controls. 2. Normal coronary origin with left dominance. 3. Poor quality study with poor heart rate control/irregular rhythm during exam. 4. Non-diagnostic proximal to mid LAD due to stair step artifact. Heavily calcified artery and cannot exclude moderate to severe stenosis. 5. Heavily calcified OM1 with concern for moderate stenosis (50-69%). 6. Heavily calcified ostial lesion of the non-dominant RCA and cannot exclude severe stenosis (70-99%). 7. CT FFR not sent as motion prevents accurate assessment. RECOMMENDATIONS: 1. Non-diagnostic study. Obstructive CAD can't be excluded. Would recommend left heart catheterization. Eleonore Chiquito, MD Electronically  Signed   By: Eleonore Chiquito   On: 03/07/2021 12:51   Result Date: 03/07/2021 EXAM: OVER-READ INTERPRETATION  CT CHEST The following report is an over-read performed by radiologist Dr. Markus Daft of [facility], [place] on 03/07/2021. This over-read does not include interpretation of cardiac or coronary anatomy or pathology. The coronary calcium score/coronary CTA interpretation by the cardiologist is attached. COMPARISON:  Chest CTA 03/05/2021 and chest CT 11/25/2016 FINDINGS: Vascular: Visualized pulmonary arteries are patent. Atherosclerotic calcifications involving the thoracic aorta. Mediastinum/Nodes: Visualized mediastinal structures are normal. Lungs/Pleura: No large pleural effusions. Peripheral patchy densities in lower lungs are suggestive atelectasis or scarring. No large areas of consolidation. No large pleural effusions. 3 mm nodular density in the left lower lobe on sequence 11, image 18 is along the left major fissure. This nodule appears to be stable since 2018 and most compatible with a  benign nodule. Upper Abdomen: Images of the upper abdomen are unremarkable. Musculoskeletal: Bridging osteophytes in the visualized thoracic spine. No acute bone abnormality. IMPRESSION: No acute abnormality involving the extracardiac structures. Aortic Atherosclerosis (ICD10-I70.0). Electronically Signed: By: Markus Daft M.D. On: 03/07/2021 11:03    Cardiac Studies   CTPE: Date: 03/05/21 Results: No PE Aortic Atherosclerosis LM, LAD, mLCx, and p RCA CAC  Patient Profile     80 y.o. female Morbid Obesity, HTN and DM; history of PE and AFL on eliquis, chronic venous insufficiency and  R LE wound followed by wound care seen for NSTEMI; initially thought related to AFL RVR ( and s/p urgent cardioversion for hypotension 03/05/21)  Assessment & Plan    NSTEMI Aortic Atherosclerosis and CAC PAF with CHADSVASC 6 - Refused LHC; will plan for medication management - Coreg 25 mg PO BID - losartan 50 mg PO Daily - atorvastatin 80 and zetia 10 mg - There is likely a demand ischemia component and patient has been considering outpatient LHC.  At this time will not plavix load  AFL Hx of PE - CHADSVASC 6 - AC as above  Chronic venous insufficiency Hypoalbuminemia R Wound Wheezes on exam - lasix 40 mg from home HCTZ - Incentive Spirometry offered (declined)  Will attempt to reach her son this PM to answer questions about LHC  For questions or updates, please contact [facility] Please consult [email or web address] for contact info under Cardiology/STEMI.      Signed, Werner Lean, MD  03/08/2021, 9:32 AM

## 2021-03-08 NOTE — Plan of Care (Addendum)
Follow up: Repeat EKG with SR 74 bpm, no acute changes. Telemetry showed SR 60s, previous SB 40s noted around 1140 AM. Patient re-examined in the room, poor historian, states she has been too tired for the past few days and did no think she can walk but strongly encouraged by staff here. She does not re-call any chest pain before or after syncope episodes. She states she took Coreg for a long time. Continue telemetry monitor and neuro exam.

## 2021-03-08 NOTE — Progress Notes (Signed)
[facility]  PROGRESS NOTE  Kayla [name] [id number] DOB: [birth year] DOA: 03/04/2021 PCP: Denita Lung, MD   Brief HPI:    51 female with a history of diabetes mellitus type 2, hypertension, history of PE not on anticoagulation, obesity, chronic venous insufficiency, lymphedema, chronic right lower extremity wound followed by wound care came to ED for evaluation of palpitation and fatigue.  Patient was found to be new onset atrial flutter with 2 is to 1 block with RVR.  Patient was hypotensive in the ED with SBP in 80s.  She underwent urgent cardioversion in the ED.  Cardiology was consulted.   Subjective   Recently examined, coronary CT done yesterday was unable to evaluate LAD.  Patient still not sure about LHC.   Assessment/Plan:     New onset atrial flutter 2:1 block -Presented with atrial flutter with heart rate 150s -Was hypotensive on presentation so underwent urgent cardioversion, converted to normal sinus rhythm -CHA2DS2VASc score is 6 -Started on Eliquis per pharmacy -Eliquis was held yesterday, and patient was started on IV heparin -Cardiology has restarted Eliquis today.  Elevated troponin -Likely demand ischemia from A. fib with RVR -Nuclear Myoview stress test showed intermediate risk study -Underwent coronary CTA, study could not rule out LAD lesion -Patient has been offered left heart catheterization, however she is not sure about it. -Cardiology will discuss with patient's son today.  Acute kidney injury -Likely from hypotension/losartan and HCTZ -Losartan HCTZ on hold -Renal function back to baseline -Dose of Eliquis was reduced to 2.5 mg p.o. twice daily due to renal insufficiency on 03/05/2021, changed back to 5 mg p.o. twice daily from 03/06/2021.  -Started back on losartan 50 mg daily  Pulmonary vascular congestion -CTA chest was negative for PE, shows pulmonary vascular congestion without pulmonary edema. -Also has trace bilateral lower  extremity edema -Diuretics per cardiology; patient is on Lasix 40 mg p.o. daily  Diabetes mellitus type 2 -Hemoglobin A1c 6.7 -Continue sliding scale insulin with NovoLog -CBG well controlled  Hypertension -Blood pressure improved after cardioversion -Blood pressure is now elevated -Continue Coreg -We will restart losartan at low-dose of 50 mg daily -Continue to hold HCTZ due to renal insufficiency  Scheduled medications:    apixaban  5 mg Oral BID   atorvastatin  80 mg Oral Daily   carvedilol  25 mg Oral BID WC   ezetimibe  10 mg Oral Daily   furosemide  40 mg Oral Daily   insulin aspart  0-5 Units Subcutaneous QHS   insulin aspart  0-9 Units Subcutaneous TID WC   losartan  50 mg Oral Daily         Data Reviewed:   CBG:  Recent Labs  Lab 03/07/21 0605 03/07/21 1125 03/07/21 1642 03/07/21 2110 03/08/21 0602  GLUCAP 128* 112* 101* 143* 131*    SpO2: 96 %    Vitals:   03/07/21 0500 03/07/21 1124 03/07/21 1927 03/08/21 0424  BP:  (!) 147/65 (!) 115/47 134/65  Pulse:  66 75 72  Resp:  '19 17 17  '$ Temp:  98.7 F (37.1 C) 98.8 F (37.1 C) 98.5 F (36.9 C)  TempSrc:  Oral Oral Oral  SpO2:  98% 96% 96%  Weight: 105.2 kg   104.9 kg  Height:         Intake/Output Summary (Last 24 hours) at 03/08/2021 1119 Last data filed at 03/08/2021 0900 Gross per 24 hour  Intake 1080 ml  Output 2425 ml  Net -1345 ml  08/07 1901 - 08/09 0700 In: 1200 [P.O.:1200] Out: 2825 [Urine:2825]  Filed Weights   03/06/21 0500 03/07/21 0500 03/08/21 0424  Weight: 105.5 kg 105.2 kg 104.9 kg    CBC:  Recent Labs  Lab 03/04/21 2353 03/06/21 0304 03/07/21 0237  WBC 5.3 4.8 5.5  HGB 9.6* 9.4* 9.3*  HCT 31.4* 29.8* 28.9*  PLT 237 237 228  MCV 85.3 82.1 81.6  MCH 26.1 25.9* 26.3  MCHC 30.6 31.5 32.2  RDW 14.7 14.6 14.5    Complete metabolic panel:  Recent Labs  Lab 03/04/21 2353 03/05/21 0701 03/06/21 0304 03/07/21 0237  NA 138  --  136 134*  K 4.2  --  4.2  4.3  CL 107  --  105 104  CO2 21*  --  24 25  GLUCOSE 163*  --  119* 122*  BUN 32*  --  22 18  CREATININE 1.62*  --  0.91 0.88  CALCIUM 8.8*  --  8.7* 8.5*  AST 31  --   --  17  ALT 19  --   --  16  ALKPHOS 90  --   --  80  BILITOT 0.5  --   --  0.6  ALBUMIN 3.2*  --   --  2.5*  MG 1.9  --   --   --   TSH  --  1.698  --   --   HGBA1C  --  6.7*  --   --   BNP 24.5  --   --   --     No results for input(s): LIPASE, AMYLASE in the last 168 hours.  Recent Labs  Lab 03/04/21 2353 03/05/21 0646  BNP 24.5  --   SARSCOV2NAA  --  NEGATIVE    ------------------------------------------------------------------------------------------------------------------ No results for input(s): CHOL, HDL, LDLCALC, TRIG, CHOLHDL, LDLDIRECT in the last 72 hours.   Lab Results  Component Value Date   HGBA1C 6.7 (H) 03/05/2021   ------------------------------------------------------------------------------------------------------------------ No results for input(s): TSH, T4TOTAL, T3FREE, THYROIDAB in the last 72 hours.  Invalid input(s): FREET3  ------------------------------------------------------------------------------------------------------------------ No results for input(s): VITAMINB12, FOLATE, FERRITIN, TIBC, IRON, RETICCTPCT in the last 72 hours.  Coagulation profile No results for input(s): INR, PROTIME in the last 168 hours. No results for input(s): DDIMER in the last 72 hours.  Cardiac Enzymes No results for input(s): CKTOTAL, CKMB, CKMBINDEX, TROPONINI in the last 168 hours.  ------------------------------------------------------------------------------------------------------------------    Component Value Date/Time   BNP 24.5 03/04/2021 2353     Antibiotics: Anti-infectives (From admission, onward)    None        Radiology Reports  NM Myocar Multi W/Spect W/Wall Motion / EF  Result Date: 03/06/2021  There was no ST segment deviation noted during stress.   Findings consistent with ischemia.  This is an intermediate risk study.  The left ventricular ejection fraction is hyperdynamic (>65%).  Nuclear stress EF: 66%.  There is a medium size defect of mild severity present in the basal anterior, mid anterior, apical anterior and apex location. Grossly normal wall motion. LVEF 66%. Findings suggest ischemia in LAD distribution. Intermediate risk study due to TID 1.58 and medium size defect.   CT CORONARY MORPH W/CTA COR W/SCORE W/CA W/CM &/OR WO/CM  Addendum Date: 03/07/2021   ADDENDUM REPORT: 03/07/2021 12:51 CLINICAL DATA:  Chest pain EXAM: Cardiac/Coronary CTA TECHNIQUE: A non-contrast, gated CT scan was obtained with axial slices of 3 mm through the heart for calcium scoring. Calcium scoring was performed using the Agatston  method. A 120 kV prospective, gated, contrast cardiac scan was obtained. Gantry rotation speed was 250 msecs and collimation was 0.6 mm. Two sublingual nitroglycerin tablets (0.8 mg) were given. The 3D data set was reconstructed in 5% intervals of the 35-75% of the R-R cycle. Diastolic phases were analyzed on a dedicated workstation using MPR, MIP, and VRT modes. The patient received 95 cc of contrast. FINDINGS: Image quality: Poor quality scan with significant stair-step artifact. Irregular heart rhythm during scan. Noise artifact is: Limited. Coronary Arteries:  Normal coronary origin.  Left dominance. Left main: The left main is a large caliber vessel with a normal take off from the left coronary cusp that bifurcates to form a left anterior descending artery and a left circumflex artery. There is minimal calcified plaque (<25%). Left anterior descending artery: There are heavily calcifications of the proximal to mid LAD. There is stair step artifact in this region and cannot be overcome by multi-phase assessment. This segment is non-diagnostic, and cannot exclude significant stenosis. The distal LAD appears patent. Left circumflex artery:  The LCX is dominant and appears patent with no evidence of plaque or stenosis. Significant stair step artifact noted. The LCX terminates as a patent PDA. OM1 is heavily calcified and contains at least a moderate (50-69%) calcified stenosis. Right coronary artery: The RCA is non-dominant with normal take off from the right coronary cusp. The ostial RCA is heavily calcified and cannot exclude a severe stenosis (70-99%). Right Atrium: Right atrial size is within normal limits. Right Ventricle: The right ventricular cavity is within normal limits. Left Atrium: Left atrial size is normal in size with no left atrial appendage filling defect. Left Ventricle: The ventricular cavity size is within normal limits. There are no stigmata of prior infarction. There is no abnormal filling defect. Pulmonary arteries: Normal in size without proximal filling defect. Pulmonary veins: Normal pulmonary venous drainage. Pericardium: Normal thickness with no significant effusion or calcium present. Cardiac valves: The aortic valve is trileaflet without significant calcification. The mitral valve is normal structure without significant calcification. Aorta: Normal caliber with no significant disease. Extra-cardiac findings: See attached radiology report for non-cardiac structures. IMPRESSION: 1. Coronary calcium score of 620. This was 89th percentile for age-, sex, and race-matched controls. 2. Normal coronary origin with left dominance. 3. Poor quality study with poor heart rate control/irregular rhythm during exam. 4. Non-diagnostic proximal to mid LAD due to stair step artifact. Heavily calcified artery and cannot exclude moderate to severe stenosis. 5. Heavily calcified OM1 with concern for moderate stenosis (50-69%). 6. Heavily calcified ostial lesion of the non-dominant RCA and cannot exclude severe stenosis (70-99%). 7. CT FFR not sent as motion prevents accurate assessment. RECOMMENDATIONS: 1. Non-diagnostic study. Obstructive CAD  can't be excluded. Would recommend left heart catheterization. Eleonore Chiquito, MD Electronically Signed   By: Eleonore Chiquito   On: 03/07/2021 12:51   Result Date: 03/07/2021 EXAM: OVER-READ INTERPRETATION  CT CHEST The following report is an over-read performed by radiologist Dr. Markus Daft of [facility], [place] on 03/07/2021. This over-read does not include interpretation of cardiac or coronary anatomy or pathology. The coronary calcium score/coronary CTA interpretation by the cardiologist is attached. COMPARISON:  Chest CTA 03/05/2021 and chest CT 11/25/2016 FINDINGS: Vascular: Visualized pulmonary arteries are patent. Atherosclerotic calcifications involving the thoracic aorta. Mediastinum/Nodes: Visualized mediastinal structures are normal. Lungs/Pleura: No large pleural effusions. Peripheral patchy densities in lower lungs are suggestive atelectasis or scarring. No large areas of consolidation. No large pleural effusions. 3 mm nodular  density in the left lower lobe on sequence 11, image 18 is along the left major fissure. This nodule appears to be stable since 2018 and most compatible with a benign nodule. Upper Abdomen: Images of the upper abdomen are unremarkable. Musculoskeletal: Bridging osteophytes in the visualized thoracic spine. No acute bone abnormality. IMPRESSION: No acute abnormality involving the extracardiac structures. Aortic Atherosclerosis (ICD10-I70.0). Electronically Signed: By: Markus Daft M.D. On: 03/07/2021 11:03      DVT prophylaxis: Apixaban  Code Status: Full code  Family Communication: No family at bedside   Consultants: Cardiology  Procedures:     Objective    Physical Examination:   General-appears in no acute distress Heart-S1-S2, regular, no murmur auscultated Lungs-decreased breath sounds at lung bases Abdomen-soft, nontender, no organomegaly Extremities-no edema in the lower extremities Neuro-alert, oriented x3, no focal deficit noted  Status is:  Inpatient  Dispo: The patient is from: Home              Anticipated d/c is to: Home              Anticipated d/c date is: 03/09/2021              Patient currently not stable for discharge  Barrier to discharge-pending left heart catheterization  COVID-19 Labs  No results for input(s): DDIMER, FERRITIN, LDH, CRP in the last 72 hours.  Lab Results  Component Value Date   [place] NEGATIVE 03/05/2021    Microbiology  Recent Results (from the past 240 hour(s))  Resp Panel by RT-PCR (Flu A&B, Covid) Nasopharyngeal Swab     Status: None   Collection Time: 03/05/21  6:46 AM   Specimen: Nasopharyngeal Swab; Nasopharyngeal(NP) swabs in vial transport medium  Result Value Ref Range Status   SARS Coronavirus 2 by RT PCR NEGATIVE NEGATIVE Final    Comment: (NOTE) SARS-CoV-2 target nucleic acids are NOT DETECTED.  The SARS-CoV-2 RNA is generally detectable in upper respiratory specimens during the acute phase of infection. The lowest concentration of SARS-CoV-2 viral copies this assay can detect is 138 copies/mL. A negative result does not preclude SARS-Cov-2 infection and should not be used as the sole basis for treatment or other patient management decisions. A negative result may occur with  improper specimen collection/handling, submission of specimen other than nasopharyngeal swab, presence of viral mutation(s) within the areas targeted by this assay, and inadequate number of viral copies(<138 copies/mL). A negative result must be combined with clinical observations, patient history, and epidemiological information. The expected result is Negative.  Fact Sheet for Patients:  EntrepreneurPulse.com.au  Fact Sheet for Healthcare Providers:  IncredibleEmployment.be  This test is no t yet approved or cleared by the [place] FDA and  has been authorized for detection and/or diagnosis of SARS-CoV-2 by FDA under an Emergency Use  Authorization (EUA). This EUA will remain  in effect (meaning this test can be used) for the duration of the COVID-19 declaration under Section 564(b)(1) of the Act, 21 U.S.C.section 360bbb-3(b)(1), unless the authorization is terminated  or revoked sooner.       Influenza A by PCR NEGATIVE NEGATIVE Final   Influenza B by PCR NEGATIVE NEGATIVE Final    Comment: (NOTE) The Xpert Xpress SARS-CoV-2/FLU/RSV plus assay is intended as an aid in the diagnosis of influenza from Nasopharyngeal swab specimens and should not be used as a sole basis for treatment. Nasal washings and aspirates are unacceptable for Xpert Xpress SARS-CoV-2/FLU/RSV testing.  Fact Sheet for Patients: EntrepreneurPulse.com.au  Fact Sheet for  Healthcare Providers: IncredibleEmployment.be  This test is not yet approved or cleared by the [place] and has been authorized for detection and/or diagnosis of SARS-CoV-2 by FDA under an Emergency Use Authorization (EUA). This EUA will remain in effect (meaning this test can be used) for the duration of the COVID-19 declaration under Section 564(b)(1) of the Act, 21 U.S.C. section 360bbb-3(b)(1), unless the authorization is terminated or revoked.  Performed at [facility], [place] [address], [place], [place] [address]           [facility] If 7PM-7AM, please contact night-coverage at [email or web address], Office  [phone]   03/08/2021, 11:19 AM  LOS: 2 days

## 2021-03-09 DIAGNOSIS — I4892 Unspecified atrial flutter: Secondary | ICD-10-CM | POA: Diagnosis not present

## 2021-03-09 LAB — BASIC METABOLIC PANEL
Anion gap: 8 (ref 5–15)
BUN: 20 mg/dL (ref 8–23)
CO2: 24 mmol/L (ref 22–32)
Calcium: 8.3 mg/dL — ABNORMAL LOW (ref 8.9–10.3)
Chloride: 101 mmol/L (ref 98–111)
Creatinine, Ser: 0.96 mg/dL (ref 0.44–1.00)
GFR, Estimated: 59 mL/min — ABNORMAL LOW (ref >60)
Glucose, Bld: 130 mg/dL — ABNORMAL HIGH (ref 70–99)
Potassium: 3.9 mmol/L (ref 3.5–5.1)
Sodium: 133 mmol/L — ABNORMAL LOW (ref 135–145)

## 2021-03-09 LAB — GLUCOSE, CAPILLARY
Glucose-Capillary: 130 mg/dL — ABNORMAL HIGH (ref 70–99)
Glucose-Capillary: 210 mg/dL — ABNORMAL HIGH (ref 70–99)
Glucose-Capillary: 132 mg/dL — ABNORMAL HIGH (ref 70–99)
Glucose-Capillary: 130 mg/dL — ABNORMAL HIGH (ref 70–99)

## 2021-03-09 MED ORDER — FUROSEMIDE 20 MG PO TABS
20.0000 mg | ORAL_TABLET | Freq: Every day | ORAL | Status: DC
  Administered 2021-03-10 – 2021-03-12 (×3): 20 mg via ORAL
  Filled 2021-03-09 (×3): qty 1

## 2021-03-09 MED ORDER — CARVEDILOL 3.125 MG PO TABS
3.1250 mg | ORAL_TABLET | Freq: Two times a day (BID) | ORAL | Status: DC
  Administered 2021-03-09 – 2021-03-15 (×12): 3.125 mg via ORAL
  Filled 2021-03-09 (×12): qty 1

## 2021-03-09 MED ORDER — DICLOFENAC SODIUM 1 % EX GEL
4.0000 g | Freq: Four times a day (QID) | CUTANEOUS | Status: DC
  Administered 2021-03-09 – 2021-03-15 (×21): 4 g via TOPICAL
  Filled 2021-03-09: qty 100

## 2021-03-09 NOTE — Progress Notes (Signed)
Progress Note  Patient Name: Kayla [name] Date of Encounter: 03/09/2021  Primary Cardiologist: Elouise Munroe, MD   Subjective   No events since last PM.  Patient notes no CP, SOB, PND, Orthopnea.  No palpitations.  Inpatient Medications    Scheduled Meds:  apixaban  5 mg Oral BID   atorvastatin  80 mg Oral Daily   ezetimibe  10 mg Oral Daily   furosemide  40 mg Oral Daily   insulin aspart  0-5 Units Subcutaneous QHS   insulin aspart  0-9 Units Subcutaneous TID WC   losartan  50 mg Oral Daily   Continuous Infusions:  PRN Meds: acetaminophen **OR** acetaminophen   Vital Signs    Vitals:   03/08/21 1441 03/08/21 1943 03/09/21 0356 03/09/21 0811  BP: (!) 145/65 134/61 (!) 138/50 (!) 123/47  Pulse: 72 82 69 87  Resp:  (!) 22 17   Temp:  99.1 F (37.3 C) 98.6 F (37 C) 98.5 F (36.9 C)  TempSrc:  Oral Oral Oral  SpO2:  93% 100% 100%  Weight:   102.9 kg   Height:        Intake/Output Summary (Last 24 hours) at 03/09/2021 0841 Last data filed at 03/09/2021 0500 Gross per 24 hour  Intake 1017 ml  Output 1275 ml  Net -258 ml   Filed Weights   03/07/21 0500 03/08/21 0424 03/09/21 0356  Weight: 105.2 kg 104.9 kg 102.9 kg    Telemetry    Sinus bradycardia-> SR- Personally Reviewed  ECG    No new  - Personally Reviewed  Physical Exam   GEN: Obese female Neck: No JVD, but thick neck Cardiac: RRR no murmurs rubs or gallobs Respiratory: Clear to ausculation bilaterally GI: Soft, nontender, non-distended  MS: non-pitting edema worse in in ankles Neuro:  Nonfocal  Psych: Normal affect   Labs    Chemistry Recent Labs  Lab 03/04/21 2353 03/06/21 0304 03/07/21 0237 03/09/21 0308  NA 138 136 134* 133*  K 4.2 4.2 4.3 3.9  CL 107 105 104 101  CO2 21* '24 25 24  '$ GLUCOSE 163* 119* 122* 130*  BUN 32* '22 18 20  '$ CREATININE 1.62* 0.91 0.88 0.96  CALCIUM 8.8* 8.7* 8.5* 8.3*  PROT 6.9  --  6.1*  --   ALBUMIN 3.2*  --  2.5*  --   AST 31  --  17  --    ALT 19  --  16  --   ALKPHOS 90  --  80  --   BILITOT 0.5  --  0.6  --   GFRNONAA 32* >60 >60 59*  ANIONGAP '10 7 5 8     '$ Hematology Recent Labs  Lab 03/04/21 2353 03/06/21 0304 03/07/21 0237  WBC 5.3 4.8 5.5  RBC 3.68* 3.63* 3.54*  HGB 9.6* 9.4* 9.3*  HCT 31.4* 29.8* 28.9*  MCV 85.3 82.1 81.6  MCH 26.1 25.9* 26.3  MCHC 30.6 31.5 32.2  RDW 14.7 14.6 14.5  PLT 237 237 228    Cardiac EnzymesNo results for input(s): TROPONINI in the last 168 hours. No results for input(s): TROPIPOC in the last 168 hours.   BNP Recent Labs  Lab 03/04/21 2353  BNP 24.5     DDimer No results for input(s): DDIMER in the last 168 hours.   Radiology    CT CORONARY MORPH W/CTA COR W/SCORE W/CA W/CM &/OR WO/CM  Addendum Date: 03/07/2021   ADDENDUM REPORT: 03/07/2021 12:51 CLINICAL DATA:  Chest pain  EXAM: Cardiac/Coronary CTA TECHNIQUE: A non-contrast, gated CT scan was obtained with axial slices of 3 mm through the heart for calcium scoring. Calcium scoring was performed using the Agatston method. A 120 kV prospective, gated, contrast cardiac scan was obtained. Gantry rotation speed was 250 msecs and collimation was 0.6 mm. Two sublingual nitroglycerin tablets (0.8 mg) were given. The 3D data set was reconstructed in 5% intervals of the 35-75% of the R-R cycle. Diastolic phases were analyzed on a dedicated workstation using MPR, MIP, and VRT modes. The patient received 95 cc of contrast. FINDINGS: Image quality: Poor quality scan with significant stair-step artifact. Irregular heart rhythm during scan. Noise artifact is: Limited. Coronary Arteries:  Normal coronary origin.  Left dominance. Left main: The left main is a large caliber vessel with a normal take off from the left coronary cusp that bifurcates to form a left anterior descending artery and a left circumflex artery. There is minimal calcified plaque (<25%). Left anterior descending artery: There are heavily calcifications of the proximal to mid  LAD. There is stair step artifact in this region and cannot be overcome by multi-phase assessment. This segment is non-diagnostic, and cannot exclude significant stenosis. The distal LAD appears patent. Left circumflex artery: The LCX is dominant and appears patent with no evidence of plaque or stenosis. Significant stair step artifact noted. The LCX terminates as a patent PDA. OM1 is heavily calcified and contains at least a moderate (50-69%) calcified stenosis. Right coronary artery: The RCA is non-dominant with normal take off from the right coronary cusp. The ostial RCA is heavily calcified and cannot exclude a severe stenosis (70-99%). Right Atrium: Right atrial size is within normal limits. Right Ventricle: The right ventricular cavity is within normal limits. Left Atrium: Left atrial size is normal in size with no left atrial appendage filling defect. Left Ventricle: The ventricular cavity size is within normal limits. There are no stigmata of prior infarction. There is no abnormal filling defect. Pulmonary arteries: Normal in size without proximal filling defect. Pulmonary veins: Normal pulmonary venous drainage. Pericardium: Normal thickness with no significant effusion or calcium present. Cardiac valves: The aortic valve is trileaflet without significant calcification. The mitral valve is normal structure without significant calcification. Aorta: Normal caliber with no significant disease. Extra-cardiac findings: See attached radiology report for non-cardiac structures. IMPRESSION: 1. Coronary calcium score of 620. This was 89th percentile for age-, sex, and race-matched controls. 2. Normal coronary origin with left dominance. 3. Poor quality study with poor heart rate control/irregular rhythm during exam. 4. Non-diagnostic proximal to mid LAD due to stair step artifact. Heavily calcified artery and cannot exclude moderate to severe stenosis. 5. Heavily calcified OM1 with concern for moderate stenosis  (50-69%). 6. Heavily calcified ostial lesion of the non-dominant RCA and cannot exclude severe stenosis (70-99%). 7. CT FFR not sent as motion prevents accurate assessment. RECOMMENDATIONS: 1. Non-diagnostic study. Obstructive CAD can't be excluded. Would recommend left heart catheterization. Eleonore Chiquito, MD Electronically Signed   By: Eleonore Chiquito   On: 03/07/2021 12:51   Result Date: 03/07/2021 EXAM: OVER-READ INTERPRETATION  CT CHEST The following report is an over-read performed by radiologist Dr. Markus Daft of [facility], [place] on 03/07/2021. This over-read does not include interpretation of cardiac or coronary anatomy or pathology. The coronary calcium score/coronary CTA interpretation by the cardiologist is attached. COMPARISON:  Chest CTA 03/05/2021 and chest CT 11/25/2016 FINDINGS: Vascular: Visualized pulmonary arteries are patent. Atherosclerotic calcifications involving the thoracic aorta. Mediastinum/Nodes: Visualized mediastinal structures  are normal. Lungs/Pleura: No large pleural effusions. Peripheral patchy densities in lower lungs are suggestive atelectasis or scarring. No large areas of consolidation. No large pleural effusions. 3 mm nodular density in the left lower lobe on sequence 11, image 18 is along the left major fissure. This nodule appears to be stable since 2018 and most compatible with a benign nodule. Upper Abdomen: Images of the upper abdomen are unremarkable. Musculoskeletal: Bridging osteophytes in the visualized thoracic spine. No acute bone abnormality. IMPRESSION: No acute abnormality involving the extracardiac structures. Aortic Atherosclerosis (ICD10-I70.0). Electronically Signed: By: Markus Daft M.D. On: 03/07/2021 11:03    Cardiac Studies   CTPE: Date: 03/05/21 Results: No PE Aortic Atherosclerosis LM, LAD, mLCx, and p RCA CAC  Patient Profile     80 y.o. female Morbid Obesity, HTN and DM; history of PE and AFL on eliquis, chronic venous insufficiency and   R LE wound followed by wound care seen for NSTEMI; initially thought related to AFL RVR ( and s/p urgent cardioversion for hypotension 03/05/21)  Assessment & Plan    NSTEMI Aortic Atherosclerosis and CAC PAF with CHADSVASC 6 - Discussed LHC at multiple times and with family, will plan for conservative mgmt unless new sx occur - Given bradycardia from day prior will start dose COREG 3.125 mg PO BID - losartan 50 mg PO Daily - atorvastatin 80 and zetia 10 mg  AFL Hx of PE - CHADSVASC 6 - AC as above  Chronic venous insufficiency Hypoalbuminemia R Wound - will decrease in lasix 20 mg PO Daily maintenance  We will work to arrange outpatient follow up  For questions or updates, please contact [facility] Please consult [email or web address] for contact info under Cardiology/STEMI.      Signed, Werner Lean, MD  03/09/2021, 8:41 AM

## 2021-03-09 NOTE — Progress Notes (Signed)
PROGRESS NOTE    MARCENA MATCHETT  [id number] DOB: [birth year] DOA: 03/04/2021 PCP: Denita Lung, MD    Brief Narrative:  Mrs. Hesseltine was admitted to the hospital with the working diagnosis of new onset atrial flutter.   80 year old female past medical history for type 2 diabetes mellitus, hypertension, history of PE, obesity, chronic venous insufficiency/lymphedema, and chronic right lower extremity wound Who presented with palpitations.  Patient had a sudden episode of diaphoresis, likely palpitations, her family called EMS and she was found in atrial flutter with rapid ventricular response.  Her blood pressure was 80s over 50s and she was transported to the hospital.  In the emergency department due to hemodynamic instability she underwent emergent direct-current cardioversion, converting to sinus rhythm.  Post cardioversion blood pressure 144/78, heart rate 80, respiratory rate 20, temperature 97.4, oxygen saturation 99%, lungs are clear to auscultation bilaterally, heart S1-S2, present, rhythmic, abdomen soft, positive lower extremity edema.  Sodium 138, potassium 4.2, chloride 107, bicarb 21, glucose 163, BUN 32, creatinine 1.62, magnesium 1.9, high sensitive troponin [phone], white count 5.3, hemoglobin 9.6, hematocrit 31.4, platelets 237. SARS COVID-19 negative.  Chest radiograph no infiltrates CT chest with faint groundglass opacities bilaterally, negative for pulm embolism.  EKG 147 bpm, normal axis, normal QTC, SVT/atrial flutter rhythm, no significant ST segment or T wave changes.  Assessment & Plan:   Principal Problem:   Atrial flutter with rapid ventricular response (HCC) Active Problems:   Primary hypertension   Elevated troponin   AKI (acute kidney injury) ([place])   Type 2 diabetes mellitus (HCC)   Atrial flutter ([place])   New onset atrial flutter. Sp emergent cardioversion. Telemetry personally reviewed, patient has been on sinus rhythm with heart rate 70 and  80's. No further palpitations.  Continue rate control with low dose carvedilol and anticoagulation with apixaban.  Out of bed to chair and physical and occupational therapy consultation. Yesterday patient had a syncope episode, while having worsening left ankle pain with mobility.    2. Left ankle pain due to osteoarthritis. Persistent pian, moderate in intensity and worse with movement.  Add topical diclofenac and consult PT and OT, patient uses a walker at home and lives alone.   3. HTN. Continue blood pressure monitoring. Tolerating well carvedilol and losartan.  Furosemide 20 mg daily.   4. T2DM dyslipidemia  Continue glucose cover and monitoring with insulin sliding scale.  Fasting glucose 130 mg/dl.  Continue with atorvastatin and ezetimibe   5. Obesity class 2, calculated BMI is 38,9     Status is: Inpatient  Remains inpatient appropriate because:Inpatient level of care appropriate due to severity of illness  Dispo: The patient is from: Home              Anticipated d/c is to: Home              Patient currently is not medically stable to d/c.   Difficult to place patient No  DVT prophylaxis: Apixaban   Code Status:    full  Family Communication:   I spoke over the phone with the patient's sob about patient's  condition, plan of care, prognosis and all questions were addressed.    Consultants:  Cardiology    Subjective: Patient is feeling better, but continue to have left ankle pain, worse with movement, positive difficulty ambulating,   Objective: Vitals:   03/08/21 1943 03/09/21 0356 03/09/21 0811 03/09/21 1307  BP: 134/61 (!) 138/50 (!) 123/47 (!) 120/37  Pulse: 82 69  87 87  Resp: (!) 22 17    Temp: 99.1 F (37.3 C) 98.6 F (37 C) 98.5 F (36.9 C)   TempSrc: Oral Oral Oral   SpO2: 93% 100% 100% 97%  Weight:  102.9 kg    Height:        Intake/Output Summary (Last 24 hours) at 03/09/2021 1401 Last data filed at 03/09/2021 1306 Gross per 24 hour   Intake 857 ml  Output 1275 ml  Net -418 ml   Filed Weights   03/07/21 0500 03/08/21 0424 03/09/21 0356  Weight: 105.2 kg 104.9 kg 102.9 kg    Examination:   General: Not in pain or dyspnea, deconditioned  Neurology: Awake and alert, non focal  E ENT: mild pallor, no icterus, oral mucosa moist Cardiovascular: No JVD. S1-S2 present, rhythmic, no gallops, rubs, or murmurs. No lower extremity edema. Pulmonary: positive breath sounds bilaterally, adequate air movement, no wheezing, rhonchi or rales. Gastrointestinal. Abdomen soft and non tender Skin. No rashes Musculoskeletal: bilateral ankle deformities, laterally rotated, tender to passive motion on the left, bilateral knee hypertrophy.     Data Reviewed: I have personally reviewed following labs and imaging studies  CBC: Recent Labs  Lab 03/04/21 2353 03/06/21 0304 03/07/21 0237  WBC 5.3 4.8 5.5  HGB 9.6* 9.4* 9.3*  HCT 31.4* 29.8* 28.9*  MCV 85.3 82.1 81.6  PLT 237 237 [id number]   Basic Metabolic Panel: Recent Labs  Lab 03/04/21 2353 03/06/21 0304 03/07/21 0237 03/09/21 0308  NA 138 136 134* 133*  K 4.2 4.2 4.3 3.9  CL 107 105 104 101  CO2 21* '24 25 24  '$ GLUCOSE 163* 119* 122* 130*  BUN 32* '22 18 20  '$ CREATININE 1.62* 0.91 0.88 0.96  CALCIUM 8.8* 8.7* 8.5* 8.3*  MG 1.9  --   --   --    GFR: Estimated Creatinine Clearance: 52.8 mL/min (by C-G formula based on SCr of 0.96 mg/dL). Liver Function Tests: Recent Labs  Lab 03/04/21 2353 03/07/21 0237  AST 31 17  ALT 19 16  ALKPHOS 90 80  BILITOT 0.5 0.6  PROT 6.9 6.1*  ALBUMIN 3.2* 2.5*   No results for input(s): LIPASE, AMYLASE in the last 168 hours. No results for input(s): AMMONIA in the last 168 hours. Coagulation Profile: No results for input(s): INR, PROTIME in the last 168 hours. Cardiac Enzymes: No results for input(s): CKTOTAL, CKMB, CKMBINDEX, TROPONINI in the last 168 hours. BNP (last 3 results) No results for input(s): PROBNP in the last 8760  hours. HbA1C: No results for input(s): HGBA1C in the last 72 hours. CBG: Recent Labs  Lab 03/08/21 1143 03/08/21 1609 03/08/21 2115 03/09/21 0604 03/09/21 1119  GLUCAP 156* 132* 164* 130* 210*   Lipid Profile: No results for input(s): CHOL, HDL, LDLCALC, TRIG, CHOLHDL, LDLDIRECT in the last 72 hours. Thyroid Function Tests: No results for input(s): TSH, T4TOTAL, FREET4, T3FREE, THYROIDAB in the last 72 hours. Anemia Panel: No results for input(s): VITAMINB12, FOLATE, FERRITIN, TIBC, IRON, RETICCTPCT in the last 72 hours.    Radiology Studies: I have reviewed all of the imaging during this hospital visit personally     Scheduled Meds:  apixaban  5 mg Oral BID   atorvastatin  80 mg Oral Daily   carvedilol  3.125 mg Oral BID WC   ezetimibe  10 mg Oral Daily   [START ON 03/10/2021] furosemide  20 mg Oral Daily   insulin aspart  0-5 Units Subcutaneous QHS   insulin aspart  0-9  Units Subcutaneous TID WC   losartan  50 mg Oral Daily   Continuous Infusions:   LOS: 3 days        Tiya Schrupp Gerome Apley, MD

## 2021-03-09 NOTE — Plan of Care (Addendum)
Follow up with cardiology arranged on 03/31/21, see AVS

## 2021-03-10 ENCOUNTER — Inpatient Hospital Stay (HOSPITAL_COMMUNITY): Payer: Medicare PPO

## 2021-03-10 DIAGNOSIS — I4892 Unspecified atrial flutter: Secondary | ICD-10-CM | POA: Diagnosis not present

## 2021-03-10 DIAGNOSIS — E11618 Type 2 diabetes mellitus with other diabetic arthropathy: Secondary | ICD-10-CM

## 2021-03-10 LAB — GLUCOSE, CAPILLARY
Glucose-Capillary: 159 mg/dL — ABNORMAL HIGH (ref 70–99)
Glucose-Capillary: 104 mg/dL — ABNORMAL HIGH (ref 70–99)
Glucose-Capillary: 141 mg/dL — ABNORMAL HIGH (ref 70–99)
Glucose-Capillary: 140 mg/dL — ABNORMAL HIGH (ref 70–99)
Glucose-Capillary: 155 mg/dL — ABNORMAL HIGH (ref 70–99)

## 2021-03-10 MED ORDER — OXYCODONE HCL 5 MG PO TABS
5.0000 mg | ORAL_TABLET | ORAL | Status: DC | PRN
  Administered 2021-03-10 (×2): 5 mg via ORAL
  Filled 2021-03-10 (×3): qty 1

## 2021-03-10 MED ORDER — ACETAMINOPHEN 325 MG PO TABS
650.0000 mg | ORAL_TABLET | Freq: Four times a day (QID) | ORAL | Status: DC
  Administered 2021-03-10 – 2021-03-14 (×12): 650 mg via ORAL
  Filled 2021-03-10 (×16): qty 2

## 2021-03-10 MED ORDER — IBUPROFEN 200 MG PO TABS
200.0000 mg | ORAL_TABLET | Freq: Three times a day (TID) | ORAL | Status: DC
  Administered 2021-03-10 (×2): 200 mg via ORAL
  Filled 2021-03-10 (×2): qty 1

## 2021-03-10 NOTE — Progress Notes (Signed)
PROGRESS NOTE    MIKALYN HOSKING  [id number] DOB: 12/15/1940 DOA: 03/04/2021 PCP: Denita Lung, MD    Brief Narrative:  Mrs. Parrilli was admitted to the hospital with the working diagnosis of new onset atrial flutter.    80 year old female past medical history for type 2 diabetes mellitus, hypertension, history of PE, obesity, chronic venous insufficiency/lymphedema, and chronic right lower extremity wound Who presented with palpitations.  Patient had a sudden episode of diaphoresis, likely palpitations, her family called EMS and she was found in atrial flutter with rapid ventricular response.  Her blood pressure was 80s over 50s and she was transported to the hospital.  In the emergency department due to hemodynamic instability she underwent emergent direct-current cardioversion, converting to sinus rhythm.  Post cardioversion blood pressure 144/78, heart rate 80, respiratory rate 20, temperature 97.4, oxygen saturation 99%, lungs are clear to auscultation bilaterally, heart S1-S2, present, rhythmic, abdomen soft, positive lower extremity edema.   Sodium 138, potassium 4.2, chloride 107, bicarb 21, glucose 163, BUN 32, creatinine 1.62, magnesium 1.9, high sensitive troponin [phone], white count 5.3, hemoglobin 9.6, hematocrit 31.4, platelets 237. SARS COVID-19 negative.   Chest radiograph no infiltrates CT chest with faint groundglass opacities bilaterally, negative for pulm embolism.   EKG 147 bpm, normal axis, normal QTC, SVT/atrial flutter rhythm, no significant ST segment or T wave changes.  Patient has remained on sinus rhythm after cardioversion, reate control with B blockade and tolerating well anticoagulation with apixaban.   08/10 patient had a syncope episode, while having worsening left ankle pain with passive mobility.     Persistent left ankle pain, worse with movement.    Assessment & Plan:   Principal Problem:   Atrial flutter with rapid ventricular response  (HCC) Active Problems:   Primary hypertension   Elevated troponin   AKI (acute kidney injury) ([place])   Type 2 diabetes mellitus (HCC)   Atrial flutter ([place])   New onset atrial flutter. Sp emergent cardioversion.  Patient continue sinus rhythm with rate well controlled.   No further syncope episodes, tolerating well carvedilol for rate control and anticoagulation with apixaban.  No clinical signs of hypervolemia, Echocardiogram with preserved LV systolic function with EF 55 to 60% on left ventricle. Mild mitral regurgitation.   2. Left ankle pain due to osteoarthritis. Patient with persistent pain on left ankle despite topical diclofenac. Decreased mobility.  Plan to increase analgesics regimen with PRN oxycodone and scheduled acetaminophen / low dose ibuprofen.  Further work up with foot X rays and lactic acid levels.    3. HTN. Blood pressure control with losartan, furosemide and carvedilol.    4. T2DM dyslipidemia  Glucose cover and monitoring with insulin sliding scale.  Patient is tolerating po well, fasting glucose today is 130 mg/dl.  On atorvastatin and ezetimibe    5. Obesity class 2, calculated BMI is 38,9 Follow with pt and ot recommendations    Status is: Inpatient  Remains inpatient appropriate because:Inpatient level of care appropriate due to severity of illness  Dispo: The patient is from: Home              Anticipated d/c is to: Home              Patient currently is not medically stable to d/c.   Difficult to place patient No   DVT prophylaxis: Apixaban   Code Status:    full  Family Communication:  No family at the bedside      Consultants:  Cardiology     Subjective: Patient continue to have left ankle pain that limits her mobility, no nausea or vomiting, no palpitations or chest pain.   Objective: Vitals:   03/09/21 1945 03/10/21 0533 03/10/21 0805 03/10/21 1157  BP: (!) 152/61 (!) 129/56 (!) 122/104 (!) 111/49  Pulse: 83 84 87 70  Resp:  18 18    Temp: 98.9 F (37.2 C) 97.8 F (36.6 C) 99.1 F (37.3 C) 98.9 F (37.2 C)  TempSrc: Oral Oral Oral Oral  SpO2: 100% 98% 99% 100%  Weight:  102.1 kg    Height:        Intake/Output Summary (Last 24 hours) at 03/10/2021 1208 Last data filed at 03/10/2021 0900 Gross per 24 hour  Intake 600 ml  Output 1300 ml  Net -700 ml   Filed Weights   03/08/21 0424 03/09/21 0356 03/10/21 0533  Weight: 104.9 kg 102.9 kg 102.1 kg    Examination:   General: Not in pain or dyspnea,. Deconditioned  Neurology: Awake and alert, non focal  E ENT: mild pallor, no icterus, oral mucosa moist Cardiovascular: No JVD. S1-S2 present, rhythmic, no gallops, rubs, or murmurs. Trace lower extremity edema. Pulmonary: positive breath sounds bilaterally, adequate air movement, no wheezing, rhonchi or rales. Gastrointestinal. Abdomen protuberant but not tender Skin. No rashes Musculoskeletal: lateral rotation of bilateral ankles, Left ankle with no frank local edema or erythema, no increase local temperature. But tender to palpation and to passive movement.,      Data Reviewed: I have personally reviewed following labs and imaging studies  CBC: Recent Labs  Lab 03/04/21 2353 03/06/21 0304 03/07/21 0237  WBC 5.3 4.8 5.5  HGB 9.6* 9.4* 9.3*  HCT 31.4* 29.8* 28.9*  MCV 85.3 82.1 81.6  PLT 237 237 [id number]   Basic Metabolic Panel: Recent Labs  Lab 03/04/21 2353 03/06/21 0304 03/07/21 0237 03/09/21 0308  NA 138 136 134* 133*  K 4.2 4.2 4.3 3.9  CL 107 105 104 101  CO2 21* '24 25 24  '$ GLUCOSE 163* 119* 122* 130*  BUN 32* '22 18 20  '$ CREATININE 1.62* 0.91 0.88 0.96  CALCIUM 8.8* 8.7* 8.5* 8.3*  MG 1.9  --   --   --    GFR: Estimated Creatinine Clearance: 52.6 mL/min (by C-G formula based on SCr of 0.96 mg/dL). Liver Function Tests: Recent Labs  Lab 03/04/21 2353 03/07/21 0237  AST 31 17  ALT 19 16  ALKPHOS 90 80  BILITOT 0.5 0.6  PROT 6.9 6.1*  ALBUMIN 3.2* 2.5*   No results for  input(s): LIPASE, AMYLASE in the last 168 hours. No results for input(s): AMMONIA in the last 168 hours. Coagulation Profile: No results for input(s): INR, PROTIME in the last 168 hours. Cardiac Enzymes: No results for input(s): CKTOTAL, CKMB, CKMBINDEX, TROPONINI in the last 168 hours. BNP (last 3 results) No results for input(s): PROBNP in the last 8760 hours. HbA1C: No results for input(s): HGBA1C in the last 72 hours. CBG: Recent Labs  Lab 03/09/21 1119 03/09/21 1600 03/09/21 2113 03/10/21 0603 03/10/21 1114  GLUCAP 210* 132* 130* 159* 104*   Lipid Profile: No results for input(s): CHOL, HDL, LDLCALC, TRIG, CHOLHDL, LDLDIRECT in the last 72 hours. Thyroid Function Tests: No results for input(s): TSH, T4TOTAL, FREET4, T3FREE, THYROIDAB in the last 72 hours. Anemia Panel: No results for input(s): VITAMINB12, FOLATE, FERRITIN, TIBC, IRON, RETICCTPCT in the last 72 hours.    Radiology Studies: I have reviewed all of the imaging during  this hospital visit personally     Scheduled Meds:  acetaminophen  650 mg Oral QID   apixaban  5 mg Oral BID   atorvastatin  80 mg Oral Daily   carvedilol  3.125 mg Oral BID WC   diclofenac Sodium  4 g Topical QID   ezetimibe  10 mg Oral Daily   furosemide  20 mg Oral Daily   insulin aspart  0-5 Units Subcutaneous QHS   insulin aspart  0-9 Units Subcutaneous TID WC   losartan  50 mg Oral Daily   Continuous Infusions:   LOS: 4 days        Kess Mcilwain Gerome Apley, MD

## 2021-03-10 NOTE — Plan of Care (Signed)
Kayla [name] has been having intermittent left leg pain. Pain has improved with schd. Tylenol and oxycodone prn. She is neuro intact rates her pain 8-9/10. On RA fine crackles RLL. Ate 50% of meals. Urine output is diminished. Endorses some edema to BLE non pitting. Pt has decreased mobility to BLE due to pain. Could benefit from PT. Skin is dry on both feet and lower legs. Last BM 8/8 refuses bowel regimen at this time. IV access unchanged and patent. Full bath given during day shift.    Problem: Education: Goal: Knowledge of disease or condition will improve Outcome: Progressing Goal: Understanding of medication regimen will improve Outcome: Progressing Goal: Individualized Educational Video(s) Outcome: Progressing   Problem: Cardiac: Goal: Ability to achieve and maintain adequate cardiopulmonary perfusion will improve Outcome: Progressing   Problem: Activity: Goal: Ability to tolerate increased activity will improve Outcome: Not Progressing   Problem: Activity: Goal: Risk for activity intolerance will decrease Outcome: Not Progressing

## 2021-03-10 NOTE — Progress Notes (Signed)
Progress Note  Patient Name: Kayla [name] Date of Encounter: 03/10/2021  Primary Cardiologist: Elouise Munroe, MD   Subjective   No events since last PM.  Notes persistent L ankle Pain.  Notes today his her son, Terry's birthday.  Inpatient Medications    Scheduled Meds:  acetaminophen  650 mg Oral QID   apixaban  5 mg Oral BID   atorvastatin  80 mg Oral Daily   carvedilol  3.125 mg Oral BID WC   diclofenac Sodium  4 g Topical QID   ezetimibe  10 mg Oral Daily   furosemide  20 mg Oral Daily   insulin aspart  0-5 Units Subcutaneous QHS   insulin aspart  0-9 Units Subcutaneous TID WC   losartan  50 mg Oral Daily   Continuous Infusions:  PRN Meds: oxyCODONE   Vital Signs    Vitals:   03/09/21 1307 03/09/21 1945 03/10/21 0533 03/10/21 0805  BP: (!) 120/37 (!) 152/61 (!) 129/56 (!) 122/104  Pulse: 87 83 84 87  Resp:  18 18   Temp:  98.9 F (37.2 C) 97.8 F (36.6 C) 99.1 F (37.3 C)  TempSrc:  Oral Oral Oral  SpO2: 97% 100% 98% 99%  Weight:   102.1 kg   Height:        Intake/Output Summary (Last 24 hours) at 03/10/2021 1019 Last data filed at 03/10/2021 0900 Gross per 24 hour  Intake 600 ml  Output 1300 ml  Net -700 ml   Filed Weights   03/08/21 0424 03/09/21 0356 03/10/21 0533  Weight: 104.9 kg 102.9 kg 102.1 kg    Telemetry    Sinus rhythm- Personally Reviewed  ECG    No new  - Personally Reviewed  Physical Exam   GEN: Obese female Neck: No JVD, but thick neck Cardiac: RRR no murmurs rubs or gallobs Respiratory: Clear to ausculation bilaterally GI: Soft, nontender, non-distended  MS: non-pitting edema worse in in ankles; L ankle is painful to touch Neuro:  Nonfocal  Psych: Normal affect   Labs    Chemistry Recent Labs  Lab 03/04/21 2353 03/06/21 0304 03/07/21 0237 03/09/21 0308  NA 138 136 134* 133*  K 4.2 4.2 4.3 3.9  CL 107 105 104 101  CO2 21* '24 25 24  '$ GLUCOSE 163* 119* 122* 130*  BUN 32* '22 18 20  '$ CREATININE 1.62*  0.91 0.88 0.96  CALCIUM 8.8* 8.7* 8.5* 8.3*  PROT 6.9  --  6.1*  --   ALBUMIN 3.2*  --  2.5*  --   AST 31  --  17  --   ALT 19  --  16  --   ALKPHOS 90  --  80  --   BILITOT 0.5  --  0.6  --   GFRNONAA 32* >60 >60 59*  ANIONGAP '10 7 5 8     '$ Hematology Recent Labs  Lab 03/04/21 2353 03/06/21 0304 03/07/21 0237  WBC 5.3 4.8 5.5  RBC 3.68* 3.63* 3.54*  HGB 9.6* 9.4* 9.3*  HCT 31.4* 29.8* 28.9*  MCV 85.3 82.1 81.6  MCH 26.1 25.9* 26.3  MCHC 30.6 31.5 32.2  RDW 14.7 14.6 14.5  PLT 237 237 228    Cardiac EnzymesNo results for input(s): TROPONINI in the last 168 hours. No results for input(s): TROPIPOC in the last 168 hours.   BNP Recent Labs  Lab 03/04/21 2353  BNP 24.5     DDimer No results for input(s): DDIMER in the last 168  hours.   Radiology    No results found.  Cardiac Studies   CTPE: Date: 03/05/21 Results: No PE Aortic Atherosclerosis LM, LAD, mLCx, and p RCA CAC  Patient Profile     80 y.o. female Morbid Obesity, HTN and DM; history of PE and AFL on eliquis, chronic venous insufficiency and  R LE wound followed by wound care seen for NSTEMI; initially thought related to AFL RVR ( and s/p urgent cardioversion for hypotension 03/05/21)  Assessment & Plan    NSTEMI Aortic Atherosclerosis and CAC PAF with CHADSVASC 6 HTN - Discussed LHC at multiple times and with family, will plan for conservative mgmt unless new sx occur; she has f/u 03/31/21 for outpatient eval - Coreg 3.125 mg PO BID  seems to be best mix of BP, bradycardia, mgmt - losartan 50 mg PO Daily - atorvastatin 80 and zetia 10 mg  AFL Hx of PE - CHADSVASC 6 - AC as above  Chronic venous insufficiency Hypoalbuminemia R Wound - lasix 20 mg PO Daily maintenance   [facility] will sign off.   Medication Recommendations:  CAD med mgmt as above  Other recommendations (labs, testing, etc):  NA Follow up as an outpatient:  has 03/31/21 f/u for f/u and to discuss LHC   For questions  or updates, please contact [place] Please consult [email or web address] for contact info under Cardiology/STEMI.      Signed, Werner Lean, MD  03/10/2021, 10:19 AM

## 2021-03-10 NOTE — Care Management Important Message (Signed)
Important Message  Patient Details  Name: Kayla [name] MRN: [id number] Date of Birth: Mar 11, 1941   Medicare Important Message Given:  Yes     Shelda Altes 03/10/2021, 10:07 AM

## 2021-03-11 LAB — URIC ACID: Uric Acid, Serum: 7.2 mg/dL — ABNORMAL HIGH (ref 2.5–7.1)

## 2021-03-11 LAB — GLUCOSE, CAPILLARY
Glucose-Capillary: 109 mg/dL — ABNORMAL HIGH (ref 70–99)
Glucose-Capillary: 125 mg/dL — ABNORMAL HIGH (ref 70–99)
Glucose-Capillary: 155 mg/dL — ABNORMAL HIGH (ref 70–99)
Glucose-Capillary: 154 mg/dL — ABNORMAL HIGH (ref 70–99)

## 2021-03-11 MED ORDER — PREDNISONE 20 MG PO TABS
20.0000 mg | ORAL_TABLET | Freq: Every day | ORAL | Status: AC
  Administered 2021-03-11 – 2021-03-13 (×3): 20 mg via ORAL
  Filled 2021-03-11 (×3): qty 1

## 2021-03-11 NOTE — Progress Notes (Signed)
PROGRESS NOTE    Kayla [name]  [id number] DOB: 03/10/1941 DOA: 03/04/2021 PCP: Denita Lung, MD    Brief Narrative:  Mrs. Joyner was admitted to the hospital with the working diagnosis of new onset atrial flutter.    80 year old female past medical history for type 2 diabetes mellitus, hypertension, history of PE, obesity, chronic venous insufficiency/lymphedema, and chronic right lower extremity wound Who presented with palpitations.  Patient had a sudden episode of diaphoresis, likely palpitations, her family called EMS and she was found in atrial flutter with rapid ventricular response.  Her blood pressure was 80s over 50s and she was transported to the hospital.  In the emergency department due to hemodynamic instability she underwent emergent direct-current cardioversion, converting to sinus rhythm.  Post cardioversion blood pressure 144/78, heart rate 80, respiratory rate 20, temperature 97.4, oxygen saturation 99%, lungs are clear to auscultation bilaterally, heart S1-S2, present, rhythmic, abdomen soft, positive lower extremity edema.   Sodium 138, potassium 4.2, chloride 107, bicarb 21, glucose 163, BUN 32, creatinine 1.62, magnesium 1.9, high sensitive troponin [id number], white count 5.3, hemoglobin 9.6, hematocrit 31.4, platelets 237. SARS COVID-19 negative.   Chest radiograph no infiltrates CT chest with faint groundglass opacities bilaterally, negative for pulm embolism.   EKG 147 bpm, normal axis, normal QTC, SVT/atrial flutter rhythm, no significant ST segment or T wave changes.   Patient has remained on sinus rhythm after cardioversion, reate control with B blockade and tolerating well anticoagulation with apixaban.    08/10 patient had a syncope episode, while having worsening left ankle pain with passive mobility.     Persistent left ankle pain, worse with movement, acute gout flare.   Assessment & Plan:   Principal Problem:   Atrial flutter with rapid  ventricular response (HCC) Active Problems:   Primary hypertension   Elevated troponin   AKI (acute kidney injury) ([place])   Type 2 diabetes mellitus (HCC)   Atrial flutter ([place])   New onset atrial flutter. Sp emergent cardioversion.  Sinus rhythm with rate well controlled. Echocardiogram with preserved LV systolic function with EF 55 to 60% on left ventricle. Mild mitral regurgitation.   Continue carvedilol and apixaban.    2. Left ankle pain due to osteoarthritis/ acute gout flare. Patient with persistent pain on left ankle despite topical diclofenac. Decreased mobility.   Added low dose ibuprofen with mild improvement in left ankle pain, lactic acid elevated.  Considering her symptoms and use of loop diuretics, suspected acute gout flare. Start patient on low dose prednisone for 3 days. Dc ibuprofen and continue with acetaminophen and oxycodone.  Follow with physical and occupation therapy recommendations   Ankle films no fracture.    3. HTN. Continue with losartan, furosemide and carvedilol for blood pressure control.     4. T2DM dyslipidemia   Close follow up on glucose now patient on systemic steroids, continue insulin sliding scale for glucose cover and monitoring.   Continue with atorvastatin and ezetimibe    5. Obesity class 2, calculated BMI is 38,9     Patient continue to be at high risk for worsening gout flare   Status is: Inpatient  Remains inpatient appropriate because:Inpatient level of care appropriate due to severity of illness  Dispo: The patient is from: Home              Anticipated d/c is to: Home              Patient currently is not medically stable to d/c.  Difficult to place patient No   DVT prophylaxis: Apixaban   Code Status:    full  Family Communication:  I spoke over the phone with the patient's son about patient's  condition, plan of care, prognosis and all questions were addressed.     Consultants:  Cardiology     Subjective: Patient is feeling better, mild improvement in left ankle pain but not yet back to baseline, no nausea or vomiting, no dyspnea or chest pain   Objective: Vitals:   03/10/21 1157 03/10/21 1600 03/10/21 2000 03/11/21 0511  BP: (!) 111/49 (!) 111/46 (!) 123/45 (!) 147/65  Pulse: 70 64 61 64  Resp:  '20 18 18  '$ Temp: 98.9 F (37.2 C) 97.9 F (36.6 C) 97.6 F (36.4 C) 97.9 F (36.6 C)  TempSrc: Oral Oral Oral Oral  SpO2: 100% 100% 95% 99%  Weight:      Height:        Intake/Output Summary (Last 24 hours) at 03/11/2021 1024 Last data filed at 03/11/2021 1005 Gross per 24 hour  Intake 580 ml  Output 825 ml  Net -245 ml   Filed Weights   03/08/21 0424 03/09/21 0356 03/10/21 0533  Weight: 104.9 kg 102.9 kg 102.1 kg    Examination:   General: Not in pain or dyspnea deconditioned  Neurology: Awake and alert, non focal  E ENT: mild pallor, no icterus, oral mucosa moist Cardiovascular: No JVD. S1-S2 present, rhythmic, no gallops, rubs, or murmurs. No lower extremity edema. Pulmonary: positive breath sounds bilaterally, adequate air movement, no wheezing, rhonchi or rales. Gastrointestinal. Abdomen soft and non tender Skin. No rashes Musculoskeletal: bilateral ankle deformities, tender left ankle to palpation and movement. Hypertrophic knees bilaterally     Data Reviewed: I have personally reviewed following labs and imaging studies  CBC: Recent Labs  Lab 03/04/21 2353 03/06/21 0304 03/07/21 0237  WBC 5.3 4.8 5.5  HGB 9.6* 9.4* 9.3*  HCT 31.4* 29.8* 28.9*  MCV 85.3 82.1 81.6  PLT 237 237 [id number]   Basic Metabolic Panel: Recent Labs  Lab 03/04/21 2353 03/06/21 0304 03/07/21 0237 03/09/21 0308  NA 138 136 134* 133*  K 4.2 4.2 4.3 3.9  CL 107 105 104 101  CO2 21* '24 25 24  '$ GLUCOSE 163* 119* 122* 130*  BUN 32* '22 18 20  '$ CREATININE 1.62* 0.91 0.88 0.96  CALCIUM 8.8* 8.7* 8.5* 8.3*  MG 1.9  --   --   --    GFR: Estimated Creatinine Clearance: 52.6  mL/min (by C-G formula based on SCr of 0.96 mg/dL). Liver Function Tests: Recent Labs  Lab 03/04/21 2353 03/07/21 0237  AST 31 17  ALT 19 16  ALKPHOS 90 80  BILITOT 0.5 0.6  PROT 6.9 6.1*  ALBUMIN 3.2* 2.5*   No results for input(s): LIPASE, AMYLASE in the last 168 hours. No results for input(s): AMMONIA in the last 168 hours. Coagulation Profile: No results for input(s): INR, PROTIME in the last 168 hours. Cardiac Enzymes: No results for input(s): CKTOTAL, CKMB, CKMBINDEX, TROPONINI in the last 168 hours. BNP (last 3 results) No results for input(s): PROBNP in the last 8760 hours. HbA1C: No results for input(s): HGBA1C in the last 72 hours. CBG: Recent Labs  Lab 03/10/21 1114 03/10/21 1612 03/10/21 2033 03/10/21 2124 03/11/21 0619  GLUCAP 104* 141* 155* 140* 109*   Lipid Profile: No results for input(s): CHOL, HDL, LDLCALC, TRIG, CHOLHDL, LDLDIRECT in the last 72 hours. Thyroid Function Tests: No results for input(s): TSH,  T4TOTAL, FREET4, T3FREE, THYROIDAB in the last 72 hours. Anemia Panel: No results for input(s): VITAMINB12, FOLATE, FERRITIN, TIBC, IRON, RETICCTPCT in the last 72 hours.    Radiology Studies: I have reviewed all of the imaging during this hospital visit personally     Scheduled Meds:  acetaminophen  650 mg Oral QID   apixaban  5 mg Oral BID   atorvastatin  80 mg Oral Daily   carvedilol  3.125 mg Oral BID WC   diclofenac Sodium  4 g Topical QID   ezetimibe  10 mg Oral Daily   furosemide  20 mg Oral Daily   insulin aspart  0-5 Units Subcutaneous QHS   insulin aspart  0-9 Units Subcutaneous TID WC   losartan  50 mg Oral Daily   predniSONE  20 mg Oral Q breakfast   Continuous Infusions:   LOS: 5 days        Petronella Shuford Gerome Apley, MD

## 2021-03-11 NOTE — Therapy (Signed)
Occupational Therapy Evaluation Patient Details Name: Kayla [name] MRN: [id number] DOB: Dec 13, 1940 Today's Date: 03/11/2021    History of Present Illness Pt is an 80 y.o. female admitted 03/04/21 with palpitations, diaphoresis; workup for aflutter with RVR, hypotension. S/p emergent DCCV on 8/6. Chest CT with faint groundglass opacities. Pt with syncopal episode 8/10, worsening L ankle pain with mobility. L ankle xray negative for acute injury. PMH includes OA, HTN, DM2, obesity.   Clinical Impression   Pt admitted as above with problems as listed below. Pt is currently +2 Mod A for functional mobility/transfers and Max A/total assist for toileting/hygiene. She lives alone and demonstrates decreased awareness of deficits at this time. Pt was educated in role of OT and recommendations. She should benefit from acute OT to assist in maximizing independence with ADL's and functional mobility in preparation for d/c to next venue.     Follow Up Recommendations  SNF;Supervision/Assistance - 24 hour    Equipment Recommendations  Other (comment) (Defer to next venue)    Recommendations for Other Services PT consult     Precautions / Restrictions Precautions Precautions: Fall      Mobility Bed Mobility Overal bed mobility: Needs Assistance Bed Mobility: Supine to Sit     Supine to sit: Min assist;HOB elevated (Increased time.)     General bed mobility comments:  (Pt moves very slowly, fearful of moving secondary to LE pain.)    Transfers Overall transfer level: Needs assistance   Transfers: Sit to/from Stand;Stand Pivot Transfers Sit to Stand: Max assist;From elevated surface (Recommend +2 assist) Stand pivot transfers: Mod assist;+2 physical assistance       General transfer comment: Pt with increased difficulty using RW in standing. Pt attempted to sit in recliner when 1 foot away, requiring max verbal and tactile cues to not sit. Pt also required Max vc's and physical assist  to move R LE back toward chair prior to sitting. Forward flexed trunk in standing requiring repeated vc's to stand tall for safety. Recommend +2 assist vs lift equipment for RN staff.    Balance Overall balance assessment: Needs assistance Sitting-balance support: Bilateral upper extremity supported;Feet supported Sitting balance-Leahy Scale: Fair   Postural control: Other (comment) (Forward lean/trunk flexed in standing requiring frequent vc and t/c's to stand upright for safety.) Standing balance support: Bilateral upper extremity supported Standing balance-Leahy Scale: Poor    ADL either performed or assessed with clinical judgement   ADL Overall ADL's : Needs assistance/impaired Eating/Feeding: Set up;Sitting   Grooming: Set up;Sitting   Upper Body Bathing: Set up;Sitting;Minimal assistance   Lower Body Bathing: Maximal assistance;+2 for physical assistance;Sitting/lateral leans;Sit to/from stand;Cueing for safety;Cueing for sequencing   Upper Body Dressing : Set up;Sitting;Supervision/safety   Lower Body Dressing: Maximal assistance;+2 for physical assistance;Cueing for sequencing;Sitting/lateral leans;Sit to/from stand   Toilet Transfer: +2 for physical assistance;Cueing for safety;Cueing for sequencing;BSC;Requires wide/bariatric;RW;Moderate assistance   Toileting- Clothing Manipulation and Hygiene: +2 for physical assistance;Cueing for safety;Cueing for sequencing;Sitting/lateral lean;Sit to/from stand;Maximal assistance;Total assistance (Pt is incontinent of bladder and unaware, was sitting in wet bed. Max/total assist to clean in standing recommend +2 for safety. Decreased awareness of deficits.)       Functional mobility during ADLs: Moderate assistance;Maximal assistance;+2 for physical assistance;Rolling walker;Cueing for safety;Cueing for sequencing General ADL Comments: Pt requires Max-total assist for toileting and LB ADL's/selfcare. Pt is unaware of deficits and  states that she often sits in soiled clothing in lift chair at home "for hours" due to not being to  get up and to bathroom secondary to arthritis/gout.     Vision Baseline Vision/History: Wears glasses              Pertinent Vitals/Pain Pain Assessment: No/denies pain     Hand Dominance Right   Extremity/Trunk Assessment Upper Extremity Assessment Upper Extremity Assessment: Generalized weakness   Lower Extremity Assessment Lower Extremity Assessment: Defer to PT evaluation;Generalized weakness (Pain bilateral LE's secondary to gout/arthritis. Left LE > pain than left)       Communication Communication Communication: No difficulties   Cognition Arousal/Alertness: Awake/alert Behavior During Therapy: WFL for tasks assessed/performed Overall Cognitive Status: Within Functional Limits for tasks assessed (Decreased awareness of deficits, decreased awareness of limitations.)       General Comments  Pt takes increased time for bed and functional mobility. Recommend lift for RN staff or +2 assist secondary to decreased awareness of deficits. Discussed recommendation of SNF w/ pt. She prefers home but lives alone and currently requiring increased assistance.            Home Living Family/patient expects to be discharged to:: Private residence Living Arrangements: Alone Available Help at Discharge: Personal care attendant;Available PRN/intermittently (Pt has aide 2x/week x 4 hours. Sons live in [place] and [place]. Currently here but pt lives alone) Type of Home: House Home Access: Stairs to enter [employer] of Steps: 3 Entrance Stairs-Rails: Can reach both Home Layout: One level     Bathroom Shower/Tub: Walk-in Hydrologist: Standard     Home Equipment: Shower seat - built in;Walker - 2 wheels;Grab bars - tub/shower;Bedside commode (Sleeps in lift chair, can not get in/out of bed.Keeps 3:1 over toilet (could also be toilet riser). Wears  depends and reports that she often sits soiled "for hours" due to not being able to get out of lift chair to bathroom secondary to arthritis pain.)   Additional Comments: Pt reports assistance w/ homemaking 2 days/week for 4 hours. She also states that she often soils/urinates in her depend underwear due to pain from arthritis/gout and "I have to sit in it wet for hours sometimes before I can get up" to the bathroom, Pt sleeps in a lift chair to inability to get into/out of her bed.      Prior Functioning/Environment Level of Independence: Needs assistance  Gait / Transfers Assistance Needed: Mod I w/ RW ADL's / Homemaking Assistance Needed: Pt needed assist with homemaking/cleaning. Pt reports she does laundry and dressing Mod I   Comments: See additional comments above.        OT Problem List: Decreased strength;Decreased activity tolerance;Impaired balance (sitting and/or standing);Decreased knowledge of use of DME or AE;Decreased safety awareness;Obesity;Pain;Other (comment) (Lives alone)      OT Treatment/Interventions: Self-care/ADL training;DME and/or AE instruction;Therapeutic activities;Patient/family education    OT Goals(Current goals can be found in the care plan section) Acute Rehab OT Goals Patient Stated Goal: Go home, decreased paion in LE's OT Goal Formulation: With patient Time For Goal Achievement: 03/25/21 Potential to Achieve Goals: Fair ADL Goals Pt Will Perform Grooming: with modified independence;sitting Pt Will Perform Lower Body Bathing: with min assist;sitting/lateral leans;sit to/from stand;with adaptive equipment Pt Will Perform Lower Body Dressing: with min guard assist;sitting/lateral leans;sit to/from stand;with adaptive equipment Pt Will Transfer to Toilet: with min guard assist;ambulating;bedside commode Pt Will Perform Toileting - Clothing Manipulation and hygiene: with min guard assist;sitting/lateral leans;sit to/from stand  OT Frequency: Min  2X/week   Barriers to D/C: Decreased caregiver support;Other (comment) (Pt lives alone  and currently requires +2 physical assist for functional transfers and ADL's.)             AM-PAC OT "6 Clicks" Daily Activity     Outcome Measure Help from another person eating meals?: None Help from another person taking care of personal grooming?: A Little Help from another person toileting, which includes using toliet, bedpan, or urinal?: Total Help from another person bathing (including washing, rinsing, drying)?: A Lot Help from another person to put on and taking off regular upper body clothing?: A Little Help from another person to put on and taking off regular lower body clothing?: A Lot 6 Click Score: 15   End of Session Equipment Utilized During Treatment: Gait belt;Rolling walker Nurse Communication: Mobility status;Other (comment) (Recommend SNF rehab and +2 vs lift for assistance back to bed.)  Activity Tolerance: Patient limited by fatigue;Patient limited by pain Patient left: in chair;with call bell/phone within reach;with chair alarm set  OT Visit Diagnosis: Unsteadiness on feet (R26.81);Other abnormalities of gait and mobility (R26.89);Muscle weakness (generalized) (M62.81);Pain Pain - Right/Left:  (Bilateral LE's. Left > than Right) Pain - part of body: Ankle and joints of foot;Knee (L > R)                Time: [id number] OT Time Calculation (min): 52 min Charges:  OT General Charges $OT Visit: 1 Visit OT Evaluation $OT Eval Moderate Complexity: 1 Mod OT Treatments $Therapeutic Activity: 23-37 mins Walid Haig Beth Dixon, OTR/L 03/11/2021, 10:40 AM

## 2021-03-11 NOTE — Evaluation (Addendum)
Physical Therapy Evaluation Patient Details Name: Kayla [name] MRN: [id number] DOB: [birth year] Today's Date: 03/11/2021   History of Present Illness  Pt is an 80 y.o. female admitted 03/04/21 with palpitations, diaphoresis; workup for aflutter with RVR, hypotension. S/p emergent DCCV on 8/6. Chest CT with faint groundglass opacities. Pt with syncopal episode 8/10, worsening L ankle pain with mobility. L ankle xray negative for acute injury. PMH includes OA, HTN, DM2, obesity.   Clinical Impression  Pt presents with an overall decrease in functional mobility secondary to above. PTA, pt lives alone with PCA 2x/wk for household tasks; pt ambulatory with RW but has bouts of very limited mobility, at times sitting in soiled recliner for hours until she is able to stand up and get to bathroom. Today, pt had significant difficulty mobilizing secondary to weakness and LLE pain, requiring modA to stand with RW, unable to ambulate. Pt would benefit from continued acute PT services to maximize functional mobility and independence prior to d/c with SNF-level therapies.     Follow Up Recommendations SNF;Supervision for mobility/OOB    Equipment Recommendations  3in1 (PT);Wheelchair (measurements PT);Wheelchair cushion (measurements PT)    Recommendations for Other Services       Precautions / Restrictions Precautions Precautions: Fall Restrictions Weight Bearing Restrictions: No Other Position/Activity Restrictions: Limited by LLE pain      Mobility  Bed Mobility Overal bed mobility: Needs Assistance Bed Mobility: Supine to Sit     Supine to sit: Min assist;HOB elevated (Increased time.)     General bed mobility comments: Received sitting in recliner    Transfers Overall transfer level: Needs assistance Equipment used: Rolling walker (2 wheeled) Transfers: Sit to/from Stand Sit to Stand: Mod assist Stand pivot transfers: Mod assist;+2 physical assistance       General transfer  comment: Significant increased time and effort to flex knees in order to prepare to stand; heavy reliance on BUE support to push into standing, significant increased time and effort to walk each foot back in preparation to stand fully upright, modA for trunk elevation and stability with transitioning UE support to RW; verbal cues for sequencing  Ambulation/Gait             General Gait Details: very minimal scooting steps forwards/backwards; significant difficulty with weight shifts, unable to take complete steps  Stairs            Wheelchair Mobility    Modified Rankin (Stroke Patients Only)       Balance Overall balance assessment: Needs assistance Sitting-balance support: Bilateral upper extremity supported;Feet supported Sitting balance-Leahy Scale: Poor Sitting balance - Comments: reliant on UE support Postural control: Other (comment) (Forward lean/trunk flexed in standing requiring frequent vc and t/c's to stand upright for safety.) Standing balance support: Bilateral upper extremity supported Standing balance-Leahy Scale: Poor Standing balance comment: heavy reliance on UE support, forward flexed posture                             Pertinent Vitals/Pain Pain Assessment: Faces Faces Pain Scale: Hurts even more Pain Location: Pt reports no pain at rest, grimacing with BLE pain (L > R) with any mobility Pain Descriptors / Indicators: Cramping;Guarding;Grimacing;Moaning Pain Intervention(s): Monitored during session;Repositioned;Limited activity within patient's tolerance    Home Living Family/patient expects to be discharged to:: Private residence Living Arrangements: Alone Available Help at Discharge: Personal care attendant;Available PRN/intermittently Type of Home: House Home Access: Stairs to enter Entrance Stairs-Rails: Can  reach both Entrance Stairs-Number of Steps: 3 Home Layout: One level Home Equipment: Shower seat - built in;Walker - 2  wheels;Grab bars - tub/shower;Bedside commode;Other (comment) (lift chair) Additional Comments: Pt has PCA 2x/wk for 4 hrs who assists with household tasks    Prior Function Level of Independence: Needs assistance   Gait / Transfers Assistance Needed: Pt reports mod indep ambulating with RW; pt sleeps in lift chair due to inability to get out of tall bed; pt also reports she often soils/urinates her Depends underwear due to pain from arthritis/gout and inability to get to bathroom ("I have to sit in it wet for hours sometimes before I can get up")  ADL's / Homemaking Assistance Needed: Pt needed assist with homemaking/cleaning. Pt reports she does laundry and dressing Mod I  Comments: See additional comments above.     Hand Dominance   Dominant Hand: Right    Extremity/Trunk Assessment   Upper Extremity Assessment Upper Extremity Assessment: Generalized weakness    Lower Extremity Assessment Lower Extremity Assessment: Generalized weakness (L knee/ankle limited with c/o pain)       Communication   Communication: HOH  Cognition Arousal/Alertness: Awake/alert Behavior During Therapy: WFL for tasks assessed/performed Overall Cognitive Status: No family/caregiver present to determine baseline cognitive functioning Area of Impairment: Awareness;Safety/judgement                         Safety/Judgement: Decreased awareness of deficits Awareness: Emergent   General Comments: Decreased insight into deficits and what functionally mobility will look like if pt to return home in current state      General Comments General comments (skin integrity, edema, etc.): Continued discussion of d/c recommendations for SNF-level therapies, pt seems to be recognizing need for this    Exercises Other Exercises Other Exercises: seated edge of recliner - bilateral knee flex/ext, pt having a lot of difficulty with this secondary to pain/stiffness   Assessment/Plan    PT Assessment  Patient needs continued PT services  PT Problem List Decreased strength;Decreased range of motion;Decreased activity tolerance;Decreased balance;Decreased mobility;Decreased knowledge of use of DME;Decreased safety awareness;Obesity;Pain       PT Treatment Interventions DME instruction;Gait training;Stair training;Functional mobility training;Therapeutic activities;Therapeutic exercise;Balance training    PT Goals (Current goals can be found in the Care Plan section)  Acute Rehab PT Goals Patient Stated Goal: Return home PT Goal Formulation: With patient Time For Goal Achievement: 03/25/21 Potential to Achieve Goals: Good    Frequency Min 3X/week   Barriers to discharge Decreased caregiver support;Inaccessible home environment      Co-evaluation               AM-PAC PT "6 Clicks" Mobility  Outcome Measure Help needed turning from your back to your side while in a flat bed without using bedrails?: A Lot Help needed moving from lying on your back to sitting on the side of a flat bed without using bedrails?: A Lot Help needed moving to and from a bed to a chair (including a wheelchair)?: A Lot Help needed standing up from a chair using your arms (e.g., wheelchair or bedside chair)?: A Lot Help needed to walk in hospital room?: A Lot Help needed climbing 3-5 steps with a railing? : Total 6 Click Score: 11    End of Session Equipment Utilized During Treatment: Gait belt Activity Tolerance: Patient tolerated treatment well;Patient limited by pain Patient left: in chair;with call bell/phone within reach;with chair alarm set Nurse Communication: Mobility  status PT Visit Diagnosis: Other abnormalities of gait and mobility (R26.89);Muscle weakness (generalized) (M62.81);Pain    Time: [id number] PT Time Calculation (min) (ACUTE ONLY): 24 min   Charges:   PT Evaluation $PT Eval Moderate Complexity: 1 Mod PT Treatments $Therapeutic Activity: 8-22 mins      Mabeline Caras, PT,  DPT Acute Rehabilitation Services  Pager [phone] Office [place] 03/11/2021, 12:59 PM

## 2021-03-12 LAB — GLUCOSE, CAPILLARY
Glucose-Capillary: 128 mg/dL — ABNORMAL HIGH (ref 70–99)
Glucose-Capillary: 153 mg/dL — ABNORMAL HIGH (ref 70–99)
Glucose-Capillary: 238 mg/dL — ABNORMAL HIGH (ref 70–99)
Glucose-Capillary: 148 mg/dL — ABNORMAL HIGH (ref 70–99)

## 2021-03-12 NOTE — TOC Initial Note (Signed)
Transition of Care [facility]) - Initial/Assessment Note    Patient Details  Name: Kayla [name] MRN: [id number] Date of Birth: 03-Jan-1941  Transition of [facility]) CM/SW Contact:    Archie Endo, LCSW Phone Number: 03/12/2021, 9:07 AM  Clinical Narrative:                 CSW met with patient at bedside to complete assessment. Patient reports she lives at home alone. Patient reports her two sons are currently visiting from [place] and [place]. Patient reports she feels safe at home. Patient reports she uses a walker at home and is able to complete majority of her ADL's independently. Patient reports she has an aide from [employer] that comes twice a week. Patient reports she has a walk in shower. Patient reports she has never been to SNF before but states she does not want to go to [facility] or [facility]. Patient questions answered and she is agreeable for her information to be sent out to local facilities for review.  CSW completed FL2 and faxed patient's information to facilities for review.  Expected Discharge Plan: Skilled Nursing Facility Barriers to Discharge: Insurance Authorization, SNF Pending bed offer   Patient Goals and CMS Choice Patient states their goals for this hospitalization and ongoing recovery are:: Return home CMS Medicare.gov Compare Post Acute Care list provided to:: Patient Choice offered to / list presented to : Patient  Expected Discharge Plan and Services Expected Discharge Plan: [place] arrangements for the past 2 months: Single Family Home                                      Prior Living Arrangements/Services Living arrangements for the past 2 months: Single Family Home Lives with:: Self Patient language and need for interpreter reviewed:: Yes Do you feel safe going back to the place where you live?: Yes      Need for Family Participation in Patient Care: Yes (Comment) Care giver support system in  place?: Yes (comment) Current home services: Other (comment), DME (Patient uses a walker and has an aide from [employer] that comes twice a week) Criminal Activity/Legal Involvement Pertinent to Current Situation/Hospitalization: No - Comment as needed  Activities of Daily Living Home Assistive Devices/Equipment: Cane (specify quad or straight), Walker (specify type) ADL Screening (condition at time of admission) Patient's cognitive ability adequate to safely complete daily activities?: Yes Is the patient deaf or have difficulty hearing?: Yes (left hearing aid at home) Does the patient have difficulty seeing, even when wearing glasses/contacts?: No Does the patient have difficulty concentrating, remembering, or making decisions?: No Patient able to express need for assistance with ADLs?: Yes Does the patient have difficulty dressing or bathing?: Yes Independently performs ADLs?: No Communication: Independent Dressing (OT): Needs assistance Is this a change from baseline?: Change from baseline, expected to last <3days Grooming: Needs assistance Is this a change from baseline?: Change from baseline, expected to last <3 days Feeding: Independent Bathing: Needs assistance Is this a change from baseline?: Pre-admission baseline Toileting: Needs assistance Is this a change from baseline?: Change from baseline, expected to last <3 days In/Out Bed: Independent with device (comment) Walks in Home: Independent with device (comment) Does the patient have difficulty walking or climbing stairs?: Yes Weakness of Legs: Both Weakness of Arms/Hands: Both  Permission Sought/Granted  Emotional Assessment Appearance:: Appears stated age Attitude/Demeanor/Rapport: Ambitious, Engaged, Gracious Affect (typically observed): Calm, Accepting, Happy, Hopeful, Pleasant Orientation: : Oriented to Place, Oriented to  Time, Oriented to Situation, Oriented to Self Alcohol / Substance Use:  Never Used Psych Involvement: No (comment)  Admission diagnosis:  Atrial flutter (HCC) [I48.92] Elevated troponin [R77.8] Atrial flutter with rapid ventricular response (HCC) [I48.92] Patient Active Problem List   Diagnosis Date Noted   Atrial flutter ([place]) 03/06/2021   Atrial flutter with rapid ventricular response ([place]) 03/05/2021   AKI (acute kidney injury) ([place]) 03/05/2021   Type 2 diabetes mellitus ([place]) 03/05/2021   Elevated troponin    Anemia due to chronic blood loss 08/30/2020   Constipation 08/30/2020   Feces contents abnormal 08/30/2020   Hemorrhoids 08/30/2020   Proctalgia 08/30/2020   Rectal bleeding 08/30/2020   Lymphedema 12/23/2019   Arthritis 12/18/2018   Fatigue 07/09/2018   Gastroesophageal reflux disease without esophagitis 04/16/2018   Dependent edema 01/01/2017   Anemia of chronic illness 02/15/2015   Multiple falls 02/15/2015   Diabetic neuropathy, type II diabetes mellitus ([place]) 09/09/2012   Hyperlipidemia associated with type 2 diabetes mellitus ([place]) 05/22/2011   Primary hypertension 05/22/2011   Morbid obesity ([place]) 12/14/2010   History of pulmonary embolus (PE) 12/14/2010   PCP:  Denita Lung, MD Pharmacy:   [id number]Phone: [phone]: [address] [place]Phone: [phone]: [phone]    Social Determinants of Health (SDOH) Interventions    Readmission Risk Interventions No flowsheet data found.

## 2021-03-12 NOTE — Progress Notes (Signed)
PROGRESS NOTE    Kayla [name]  [id number] DOB: 08-Oct-1940 DOA: 03/04/2021 PCP: Denita Lung, MD    Brief Narrative:  Kayla [name] was admitted to the hospital with the working diagnosis of new onset atrial flutter.    80 year old female past medical history for type 2 diabetes mellitus, hypertension, history of PE, obesity, chronic venous insufficiency/lymphedema, and chronic right lower extremity wound Who presented with palpitations.  Patient had a sudden episode of diaphoresis, likely palpitations, her family called EMS and she was found in atrial flutter with rapid ventricular response.  Her blood pressure was 80s over 50s and she was transported to the hospital.  In the emergency department due to hemodynamic instability she underwent emergent direct-current cardioversion, converting to sinus rhythm.  Post cardioversion blood pressure 144/78, heart rate 80, respiratory rate 20, temperature 97.4, oxygen saturation 99%, lungs are clear to auscultation bilaterally, heart S1-S2, present, rhythmic, abdomen soft, positive lower extremity edema.   Sodium 138, potassium 4.2, chloride 107, bicarb 21, glucose 163, BUN 32, creatinine 1.62, magnesium 1.9, high sensitive troponin [phone], white count 5.3, hemoglobin 9.6, hematocrit 31.4, platelets 237. SARS COVID-19 negative.   Chest radiograph no infiltrates CT chest with faint groundglass opacities bilaterally, negative for pulm embolism.   EKG 147 bpm, normal axis, normal QTC, SVT/atrial flutter rhythm, no significant ST segment or T wave changes.   Patient has remained on sinus rhythm after cardioversion, reate control with B blockade and tolerating well anticoagulation with apixaban.    08/10 patient had a syncope episode, while having worsening left ankle pain with passive mobility.     Persistent left ankle pain, worse with movement, acute gout flare.   Patient very weak and deconditioned, plan to transfer to SNF.   Assessment  & Plan:   Principal Problem:   Atrial flutter with rapid ventricular response (HCC) Active Problems:   Primary hypertension   Elevated troponin   AKI (acute kidney injury) ([place])   Type 2 diabetes mellitus (HCC)   Atrial flutter ([name])     New onset atrial flutter. Sp emergent cardioversion.  Echocardiogram with preserved LV systolic function with EF 55 to 60% on left ventricle. Mild mitral regurgitation.    Continue carvedilol and apixaban with good toleration.  Will discontinue telemetry monitoring at this point.    2. Left ankle pain due to osteoarthritis/ acute gout flare. Patient with persistent pain on left ankle despite topical diclofenac. Decreased mobility. Elevated uric acid suggesting acute gout flare.    Pain has improved, but continue to have difficulty standing.  Continue with topical diclofenac and steroids (plan for 3 days).  Continue with scheduled acetaminophen and as needed oxycodone.   3. HTN. Continue blood pressure control with losartan and carvedilol  Will change furosemide to only as needed, she has no significant edema and her uric acid is elevated.   4. T2DM dyslipidemia   Capillary glucose has been stable despite systemic steroids, 155, 154, and 128.   On atorvastatin and ezetimibe    5. Obesity class 2,  BMI is 38,9    Status is: Inpatient  Remains inpatient appropriate because:Inpatient level of care appropriate due to severity of illness  Dispo: The patient is from: Home              Anticipated d/c is to: SNF              Patient currently is not medically stable to d/c.   Difficult to place patient No  DVT prophylaxis: Apixaban   Code Status:    full  Family Communication:   I spoke over the phone with the patient's son about patient's  condition, plan of care, prognosis and all questions were addressed.    Consultants:  Cardiology    Subjective: Patient with improvement in left ankle pain but not yet back to baseline, no nausea  or vomiting, no chest pain or palpitations.   Objective: Vitals:   03/11/21 2001 03/12/21 0523 03/12/21 0600 03/12/21 0831  BP: 136/62  (!) 139/54 (!) 147/53  Pulse: 68  75 87  Resp: '20  20 20  '$ Temp: 97.8 F (36.6 C)  98.3 F (36.8 C)   TempSrc: Oral  Oral   SpO2: 99%  97% 100%  Weight:  102.6 kg    Height:        Intake/Output Summary (Last 24 hours) at 03/12/2021 0948 Last data filed at 03/12/2021 0835 Gross per 24 hour  Intake 600 ml  Output 1400 ml  Net -800 ml   Filed Weights   03/09/21 0356 03/10/21 0533 03/12/21 0523  Weight: 102.9 kg 102.1 kg 102.6 kg    Examination:   General: Not in pain or dyspnea  Neurology: Awake and alert, non focal  E ENT: no pallor, no icterus, oral mucosa moist Cardiovascular: No JVD. S1-S2 present, rhythmic, no gallops, rubs, or murmurs. No lower extremity edema. Pulmonary: vesicular breath sounds bilaterally, adequate air movement, no wheezing, rhonchi or rales. Gastrointestinal. Abdomen soft and non tender Skin. Dry skin lower extremities.  Musculoskeletal: bilateral ankle deformities with hypertrophic knees, able to move left ankle with no pain, and not tender to light touch.     Data Reviewed: I have personally reviewed following labs and imaging studies  CBC: Recent Labs  Lab 03/06/21 0304 03/07/21 0237  WBC 4.8 5.5  HGB 9.4* 9.3*  HCT 29.8* 28.9*  MCV 82.1 81.6  PLT 237 [id number]   Basic Metabolic Panel: Recent Labs  Lab 03/06/21 0304 03/07/21 0237 03/09/21 0308  NA 136 134* 133*  K 4.2 4.3 3.9  CL 105 104 101  CO2 '24 25 24  '$ GLUCOSE 119* 122* 130*  BUN '22 18 20  '$ CREATININE 0.91 0.88 0.96  CALCIUM 8.7* 8.5* 8.3*   GFR: Estimated Creatinine Clearance: 52.7 mL/min (by C-G formula based on SCr of 0.96 mg/dL). Liver Function Tests: Recent Labs  Lab 03/07/21 0237  AST 17  ALT 16  ALKPHOS 80  BILITOT 0.6  PROT 6.1*  ALBUMIN 2.5*   No results for input(s): LIPASE, AMYLASE in the last 168 hours. No results for  input(s): AMMONIA in the last 168 hours. Coagulation Profile: No results for input(s): INR, PROTIME in the last 168 hours. Cardiac Enzymes: No results for input(s): CKTOTAL, CKMB, CKMBINDEX, TROPONINI in the last 168 hours. BNP (last 3 results) No results for input(s): PROBNP in the last 8760 hours. HbA1C: No results for input(s): HGBA1C in the last 72 hours. CBG: Recent Labs  Lab 03/11/21 0619 03/11/21 1142 03/11/21 1647 03/11/21 2108 03/12/21 0555  GLUCAP 109* 125* 155* 154* 128*   Lipid Profile: No results for input(s): CHOL, HDL, LDLCALC, TRIG, CHOLHDL, LDLDIRECT in the last 72 hours. Thyroid Function Tests: No results for input(s): TSH, T4TOTAL, FREET4, T3FREE, THYROIDAB in the last 72 hours. Anemia Panel: No results for input(s): VITAMINB12, FOLATE, FERRITIN, TIBC, IRON, RETICCTPCT in the last 72 hours.    Radiology Studies: I have reviewed all of the imaging during this hospital visit personally  Scheduled Meds:  acetaminophen  650 mg Oral QID   apixaban  5 mg Oral BID   atorvastatin  80 mg Oral Daily   carvedilol  3.125 mg Oral BID WC   diclofenac Sodium  4 g Topical QID   ezetimibe  10 mg Oral Daily   furosemide  20 mg Oral Daily   insulin aspart  0-5 Units Subcutaneous QHS   insulin aspart  0-9 Units Subcutaneous TID WC   losartan  50 mg Oral Daily   predniSONE  20 mg Oral Q breakfast   Continuous Infusions:   LOS: 6 days        Spencer Cardinal Gerome Apley, MD

## 2021-03-12 NOTE — NC FL2 (Signed)
[place] LEVEL OF CARE SCREENING TOOL     IDENTIFICATION  Patient Name: Kayla [name] Birthdate: 21-Jan-1941 Sex: female Admission Date (Current Location): 03/04/2021  [facility] and [place] Number:  Herbalist and Address:  [facility] [place], [place] [address] [place], [place] [address]      Provider Number: [id number]  Attending Physician Name and Address:  Tawni Millers  Relative Name and Phone Number:       Current Level of Care: Hospital Recommended Level of Care: [place] Prior Approval Number:    Date Approved/Denied: 03/12/21 PASRR Number: [id number]  Discharge Plan: SNF    Current Diagnoses: Patient Active Problem List   Diagnosis Date Noted   Atrial flutter ([place]) 03/06/2021   Atrial flutter with rapid ventricular response ([place]) 03/05/2021   AKI (acute kidney injury) ([place]) 03/05/2021   Type 2 diabetes mellitus ([place]) 03/05/2021   Elevated troponin    Anemia due to chronic blood loss 08/30/2020   Constipation 08/30/2020   Feces contents abnormal 08/30/2020   Hemorrhoids 08/30/2020   Proctalgia 08/30/2020   Rectal bleeding 08/30/2020   Lymphedema 12/23/2019   Arthritis 12/18/2018   Fatigue 07/09/2018   Gastroesophageal reflux disease without esophagitis 04/16/2018   Dependent edema 01/01/2017   Anemia of chronic illness 02/15/2015   Multiple falls 02/15/2015   Diabetic neuropathy, type II diabetes mellitus ([place]) 09/09/2012   Hyperlipidemia associated with type 2 diabetes mellitus ([place]) 05/22/2011   Primary hypertension 05/22/2011   Morbid obesity ([place]) 12/14/2010   History of pulmonary embolus (PE) 12/14/2010    Orientation RESPIRATION BLADDER Height & Weight     Self, Time, Situation, Place  Normal Continent Weight: 226 lb 3.1 oz (102.6 kg) Height:  '5\' 4"'$  (162.6 cm)  BEHAVIORAL SYMPTOMS/MOOD NEUROLOGICAL BOWEL NUTRITION STATUS      Continent Diet  AMBULATORY STATUS COMMUNICATION OF  NEEDS Skin   Limited Assist Verbally Normal                       Personal Care Assistance Level of Assistance  Bathing, Dressing, Feeding Bathing Assistance: Limited assistance Feeding assistance: Independent Dressing Assistance: Limited assistance     Functional Limitations Info  Sight, Speech, Hearing Sight Info: Impaired (Glasses) Hearing Info: Adequate Speech Info: Adequate    SPECIAL CARE FACTORS FREQUENCY  PT (By licensed PT), OT (By licensed OT)     PT Frequency: 5x weekly OT Frequency: 5x weekly            Contractures Contractures Info: Not present    Additional Factors Info  Code Status, Allergies Code Status Info: Full Allergies Info: None           Current Medications (03/12/2021):  This is the current hospital active medication list Current Facility-Administered Medications  Medication Dose Route Frequency Provider Last Rate Last Admin   acetaminophen (TYLENOL) tablet 650 mg  650 mg Oral QID Arrien, Jimmy Picket, MD   650 mg at 03/12/21 [id number]   apixaban (ELIQUIS) tablet 5 mg  5 mg Oral BID Donnamae Jude, RPH   5 mg at 03/12/21 [id number]   atorvastatin (LIPITOR) tablet 80 mg  80 mg Oral Daily Shela Leff, MD   80 mg at 03/12/21 0832   carvedilol (COREG) tablet 3.125 mg  3.125 mg Oral BID WC Chandrasekhar, Mahesh A, MD   3.125 mg at 03/12/21 [id number]   diclofenac Sodium (VOLTAREN) 1 % topical gel 4 g  4 g  Topical QID Arrien, Jimmy Picket, MD   4 g at 03/12/21 0834   ezetimibe (ZETIA) tablet 10 mg  10 mg Oral Daily Chandrasekhar, Mahesh A, MD   10 mg at 03/12/21 [id number]   furosemide (LASIX) tablet 20 mg  20 mg Oral Daily Chandrasekhar, Mahesh A, MD   20 mg at 03/12/21 [id number]   insulin aspart (novoLOG) injection 0-5 Units  0-5 Units Subcutaneous QHS Shela Leff, MD       insulin aspart (novoLOG) injection 0-9 Units  0-9 Units Subcutaneous TID WC Shela Leff, MD   1 Units at 03/12/21 0640   losartan (COZAAR) tablet 50 mg  50 mg Oral Daily  Oswald Hillock, MD   50 mg at 03/12/21 [id number]   oxyCODONE (Oxy IR/ROXICODONE) immediate release tablet 5 mg  5 mg Oral Q4H PRN Arrien, Jimmy Picket, MD   5 mg at 03/10/21 1619   predniSONE (DELTASONE) tablet 20 mg  20 mg Oral Q breakfast Arrien, Jimmy Picket, MD   20 mg at 03/12/21 [id number]     Discharge Medications: Please see discharge summary for a list of discharge medications.  Relevant Imaging Results:  Relevant Lab Results:   Additional Information SSN: [id number] - Received COVID vaccines  Archie Endo, LCSW

## 2021-03-13 LAB — GLUCOSE, CAPILLARY
Glucose-Capillary: 104 mg/dL — ABNORMAL HIGH (ref 70–99)
Glucose-Capillary: 203 mg/dL — ABNORMAL HIGH (ref 70–99)
Glucose-Capillary: 184 mg/dL — ABNORMAL HIGH (ref 70–99)

## 2021-03-13 MED ORDER — METFORMIN HCL 500 MG PO TABS
500.0000 mg | ORAL_TABLET | Freq: Two times a day (BID) | ORAL | Status: DC
  Administered 2021-03-13 – 2021-03-15 (×4): 500 mg via ORAL
  Filled 2021-03-13 (×4): qty 1

## 2021-03-13 NOTE — Progress Notes (Signed)
PROGRESS NOTE    Kayla [name]  [id number] DOB: 1941-04-21 DOA: 03/04/2021 PCP: Denita Lung, MD    Brief Narrative:  Kayla [name] was admitted to the hospital with the working diagnosis of new onset atrial flutter.    80 year old female past medical history for type 2 diabetes mellitus, hypertension, history of PE, obesity, chronic venous insufficiency/lymphedema, and chronic right lower extremity wound Who presented with palpitations.  Patient had a sudden episode of diaphoresis, likely palpitations, her family called EMS and she was found in atrial flutter with rapid ventricular response.  Her blood pressure was 80s over 50s and she was transported to the hospital.  In the emergency department due to hemodynamic instability she underwent emergent direct-current cardioversion, converting to sinus rhythm.  Post cardioversion blood pressure 144/78, heart rate 80, respiratory rate 20, temperature 97.4, oxygen saturation 99%, lungs are clear to auscultation bilaterally, heart S1-S2, present, rhythmic, abdomen soft, positive lower extremity edema.   Sodium 138, potassium 4.2, chloride 107, bicarb 21, glucose 163, BUN 32, creatinine 1.62, magnesium 1.9, high sensitive troponin [phone], white count 5.3, hemoglobin 9.6, hematocrit 31.4, platelets 237. SARS COVID-19 negative.   Chest radiograph no infiltrates CT chest with faint groundglass opacities bilaterally, negative for pulm embolism.   EKG 147 bpm, normal axis, normal QTC, SVT/atrial flutter rhythm, no significant ST segment or T wave changes.   Patient has remained on sinus rhythm after cardioversion, reate control with B blockade and tolerating well anticoagulation with apixaban.    08/10 patient had a syncope episode, while having worsening left ankle pain with passive mobility.     Persistent left ankle pain, worse with movement, acute gout flare.   Patient very weak and deconditioned, plan to transfer to SNF   Assessment  & Plan:   Principal Problem:   Atrial flutter with rapid ventricular response (HCC) Active Problems:   Primary hypertension   Elevated troponin   AKI (acute kidney injury) ([place])   Type 2 diabetes mellitus ([place])   Atrial flutter ([place])     New onset atrial flutter. Sp emergent cardioversion.  Echocardiogram with preserved LV systolic function with EF 55 to 60% on left ventricle. Mild mitral regurgitation.    Tolerating well carvedilol and apixaban.   2. Left ankle pain due to osteoarthritis/ acute gout flare. Patient with persistent pain on left ankle despite topical diclofenac. Decreased mobility. Elevated uric acid suggesting acute gout flare.    Completed 3 days of prednisone and furosemide has been discontinued. Pain has improved, but she also has chronic osteoarthritis. Continue pain control with scheduled acetaminophen and as needed oxycodone.  Topical dilcofenac.    3. HTN.  losartan and carvedilol for blood pressure control. Blood pressure systolic [id number] to [id number] mmHg.   4. T2DM dyslipidemia   Now off systemic steroids, glucose continue to be well controlled.  On atorvastatin and ezetimibe    5. Obesity class 2,  BMI is 38,9    Status is: Inpatient  Remains inpatient appropriate because:Inpatient level of care appropriate due to severity of illness  Dispo: The patient is from: Home              Anticipated d/c is to: SNF              Patient currently is medically stable to d/c.   Difficult to place patient No   DVT prophylaxis:  Apixaban   Code Status:    full  Family Communication:  No family at the bedside  Consultants:  Cardiology   Procedure: DC cardioversion in the ED  Subjective: Patient continue to be very weak and deconditioned, no nausea or vomiting, no chest pain or dyspnea. Ankle pain has improved.   Objective: Vitals:   03/12/21 0831 03/12/21 1952 03/13/21 0004 03/13/21 0400  BP: (!) 147/53 (!) 121/52  (!) 142/63  Pulse: 87 76  63  Resp:  '20 18  18  '$ Temp:  99 F (37.2 C)  97.8 F (36.6 C)  TempSrc:  Oral  Oral  SpO2: 100% 95%  97%  Weight:   100.3 kg   Height:        Intake/Output Summary (Last 24 hours) at 03/13/2021 0838 Last data filed at 03/13/2021 0406 Gross per 24 hour  Intake 920 ml  Output 1150 ml  Net -230 ml   Filed Weights   03/10/21 0533 03/12/21 0523 03/13/21 0004  Weight: 102.1 kg 102.6 kg 100.3 kg    Examination:   General: Not in pain or dyspnea  Neurology: Awake and alert, non focal  E ENT: no pallor, no icterus, oral mucosa moist Cardiovascular: No JVD. S1-S2 present, rhythmic, no gallops, rubs, or murmurs. No lower extremity edema. Pulmonary:  positive breath sounds bilaterally, with no wheezing, rhonchi or rales. Gastrointestinal. Abdomen soft and non tender Skin. No rashes Musculoskeletal: arthrosis deformities ankles and knees.      Data Reviewed: I have personally reviewed following labs and imaging studies  CBC: Recent Labs  Lab 03/07/21 0237  WBC 5.5  HGB 9.3*  HCT 28.9*  MCV 81.6  PLT [id number]   Basic Metabolic Panel: Recent Labs  Lab 03/07/21 0237 03/09/21 0308  NA 134* 133*  K 4.3 3.9  CL 104 101  CO2 25 24  GLUCOSE 122* 130*  BUN 18 20  CREATININE 0.88 0.96  CALCIUM 8.5* 8.3*   GFR: Estimated Creatinine Clearance: 52 mL/min (by C-G formula based on SCr of 0.96 mg/dL). Liver Function Tests: Recent Labs  Lab 03/07/21 0237  AST 17  ALT 16  ALKPHOS 80  BILITOT 0.6  PROT 6.1*  ALBUMIN 2.5*   No results for input(s): LIPASE, AMYLASE in the last 168 hours. No results for input(s): AMMONIA in the last 168 hours. Coagulation Profile: No results for input(s): INR, PROTIME in the last 168 hours. Cardiac Enzymes: No results for input(s): CKTOTAL, CKMB, CKMBINDEX, TROPONINI in the last 168 hours. BNP (last 3 results) No results for input(s): PROBNP in the last 8760 hours. HbA1C: No results for input(s): HGBA1C in the last 72 hours. CBG: Recent Labs  Lab  03/12/21 0555 03/12/21 1056 03/12/21 1603 03/12/21 2109 03/13/21 0611  GLUCAP 128* 153* 238* 148* 104*   Lipid Profile: No results for input(s): CHOL, HDL, LDLCALC, TRIG, CHOLHDL, LDLDIRECT in the last 72 hours. Thyroid Function Tests: No results for input(s): TSH, T4TOTAL, FREET4, T3FREE, THYROIDAB in the last 72 hours. Anemia Panel: No results for input(s): VITAMINB12, FOLATE, FERRITIN, TIBC, IRON, RETICCTPCT in the last 72 hours.    Radiology Studies: I have reviewed all of the imaging during this hospital visit personally     Scheduled Meds:  acetaminophen  650 mg Oral QID   apixaban  5 mg Oral BID   atorvastatin  80 mg Oral Daily   carvedilol  3.125 mg Oral BID WC   diclofenac Sodium  4 g Topical QID   ezetimibe  10 mg Oral Daily   insulin aspart  0-5 Units Subcutaneous QHS   insulin aspart  0-9 Units Subcutaneous  TID WC   losartan  50 mg Oral Daily   predniSONE  20 mg Oral Q breakfast   Continuous Infusions:   LOS: 7 days        Darielle Hancher Gerome Apley, MD

## 2021-03-13 NOTE — Plan of Care (Signed)
?  Problem: Education: ?Goal: Knowledge of disease or condition will improve ?Outcome: Progressing ?  ?Problem: Education: ?Goal: Understanding of medication regimen will improve ?Outcome: Progressing ?  ?

## 2021-03-14 LAB — GLUCOSE, CAPILLARY
Glucose-Capillary: 104 mg/dL — ABNORMAL HIGH (ref 70–99)
Glucose-Capillary: 124 mg/dL — ABNORMAL HIGH (ref 70–99)
Glucose-Capillary: 112 mg/dL — ABNORMAL HIGH (ref 70–99)

## 2021-03-14 NOTE — TOC Progression Note (Addendum)
Transition of Care [facility]) - Progression Note    Patient Details  Name: Kayla [name] MRN: [id number] Date of Birth: 05-Jan-1941  Transition of Care [facility]) CM/SW Contact  Reece Agar, [place] Phone Number: 03/14/2021, 4:00 PM  Clinical Narrative:    CSW spoke with pt about DC plan to a SNF, pt would like CSW to speak with her son to make a decision. CSW will follow up with pt son via phone.  CSW reached out to pt son about SNF options and has agreed to e-mail him a copy of the medicare.gov website so that he and is mother can discuss SNF placement via phone. CSW will follow up with [facility] and [facility] per family request.   CSW took medicare.gov to pt in room and she shared she did not want [facility], CSW will still reach out to [facility]. Pt wants her son to transport her, they will discuss transportation and follow up with CSW tomorrow.   CSW spoke with Lexine Baton at [facility] who will offer pt a bed. CSW contacted pt son who is agreeable to [facility]. Csw started auth and will follow up with the family tomorrow.   Expected Discharge Plan: Skilled Nursing Facility Barriers to Discharge: Ship broker, SNF Pending bed offer  Expected Discharge Plan and Services Expected Discharge Plan: [place] arrangements for the past 2 months: Single Family Home                                       Social Determinants of Health (SDOH) Interventions    Readmission Risk Interventions No flowsheet data found.

## 2021-03-14 NOTE — Progress Notes (Signed)
Physical Therapy Treatment Patient Details Name: Kayla [name] MRN: [id number] DOB: 18-Sep-1940 Today's Date: 03/14/2021    History of Present Illness Pt is an 80 y.o. female admitted 03/04/21 with palpitations, diaphoresis; workup for aflutter with RVR, hypotension. S/p emergent DCCV on 8/6. Chest CT with faint groundglass opacities. Pt with syncopal episode 8/10, worsening L ankle pain with mobility. L ankle xray negative for acute injury. PMH includes OA, HTN, DM2, obesity.    PT Comments    Pt is seen for exercises given that she is not up to getting OOB today.  Talked with her about rom to L foot and ankle to move and maintain mobility.  Follow acutely for her goals of acute PT and focus on being up standing to chair as pt tolerates.   Follow Up Recommendations  SNF     Equipment Recommendations  3in1 (PT);Wheelchair (measurements PT);Wheelchair cushion (measurements PT)    Recommendations for Other Services       Precautions / Restrictions Precautions Precautions: Fall Restrictions Weight Bearing Restrictions: No Other Position/Activity Restrictions: Limited by LLE pain    Mobility  Bed Mobility               General bed mobility comments: declined OOB    Transfers                    Ambulation/Gait                 Stairs             Wheelchair Mobility    Modified Rankin (Stroke Patients Only)       Balance                                            Cognition Arousal/Alertness: Awake/alert Behavior During Therapy: WFL for tasks assessed/performed Overall Cognitive Status: Within Functional Limits for tasks assessed                                        Exercises General Exercises - Lower Extremity Ankle Circles/Pumps: AROM;AAROM;5 reps Quad Sets: AROM;10 reps Gluteal Sets: AAROM;10 reps Heel Slides: AAROM;Right;10 reps Hip ABduction/ADduction: AAROM;10 reps    General Comments  General comments (skin integrity, edema, etc.): skin on LLE is thick and dry      Pertinent Vitals/Pain Pain Assessment: Faces Pain Score: 2  Faces Pain Scale: Hurts even more Pain Location: lower leg and foot Pain Descriptors / Indicators: Burning;Guarding Pain Intervention(s): Limited activity within patient's tolerance;Repositioned;Monitored during session    Home Living                      Prior Function            PT Goals (current goals can now be found in the care plan section) Acute Rehab PT Goals Patient Stated Goal: Return home Progress towards PT goals: Not progressing toward goals - comment    Frequency    Min 3X/week      PT Plan Current plan remains appropriate    Co-evaluation              AM-PAC PT "6 Clicks" Mobility   Outcome Measure  Help needed turning from your back to your side while in a flat  bed without using bedrails?: A Little Help needed moving from lying on your back to sitting on the side of a flat bed without using bedrails?: A Lot Help needed moving to and from a bed to a chair (including a wheelchair)?: A Lot Help needed standing up from a chair using your arms (e.g., wheelchair or bedside chair)?: A Lot Help needed to walk in hospital room?: A Lot Help needed climbing 3-5 steps with a railing? : Total 6 Click Score: 12    End of Session   Activity Tolerance: Patient tolerated treatment well;Patient limited by pain Patient left: in bed;with call bell/phone within reach;with bed alarm set Nurse Communication: Mobility status PT Visit Diagnosis: Other abnormalities of gait and mobility (R26.89);Muscle weakness (generalized) (M62.81);Pain     Time: 1250-1316 PT Time Calculation (min) (ACUTE ONLY): 26 min  Charges:  $Therapeutic Exercise: 23-37 mins                 Ramond Dial 03/14/2021, 8:34 PM  Mee Hives, PT MS Acute Rehab Dept. Number: [place] and [place]

## 2021-03-14 NOTE — Care Management Important Message (Signed)
Important Message  Patient Details  Name: LYRIQ FLAMAND MRN: [id number] Date of Birth: 1941-02-01   Medicare Important Message Given:  Yes     Shelda Altes 03/14/2021, 9:01 AM

## 2021-03-14 NOTE — Progress Notes (Signed)
PROGRESS NOTE    Kayla [name]  [id number] DOB: [birth year] DOA: 03/04/2021 PCP: Denita Lung, MD    Brief Narrative:  Kayla [name] was admitted to the hospital with the working diagnosis of new onset atrial flutter.    80 year old female past medical history for type 2 diabetes mellitus, hypertension, history of PE, obesity, chronic venous insufficiency/lymphedema, and chronic right lower extremity wound Who presented with palpitations.  Patient had a sudden episode of diaphoresis, likely palpitations, her family called EMS and she was found in atrial flutter with rapid ventricular response.  Her blood pressure was 80s over 50s and she was transported to the hospital.  In the emergency department due to hemodynamic instability she underwent emergent direct-current cardioversion, converting to sinus rhythm.  Post cardioversion blood pressure 144/78, heart rate 80, respiratory rate 20, temperature 97.4, oxygen saturation 99%, lungs are clear to auscultation bilaterally, heart S1-S2, present, rhythmic, abdomen soft, positive lower extremity edema.   Sodium 138, potassium 4.2, chloride 107, bicarb 21, glucose 163, BUN 32, creatinine 1.62, magnesium 1.9, high sensitive troponin [phone], white count 5.3, hemoglobin 9.6, hematocrit 31.4, platelets 237. SARS COVID-19 negative.   Chest radiograph no infiltrates CT chest with faint groundglass opacities bilaterally, negative for pulm embolism.   EKG 147 bpm, normal axis, normal QTC, SVT/atrial flutter rhythm, no significant ST segment or T wave changes.   Patient has remained on sinus rhythm after cardioversion, reate control with B blockade and tolerating well anticoagulation with apixaban.    08/10 patient had a syncope episode, while having worsening left ankle pain with passive mobility.     Persistent left ankle pain, worse with movement, acute gout flare.   Patient very weak and deconditioned, plan to transfer to SNF      Assessment & Plan:   Principal Problem:   Atrial flutter with rapid ventricular response (HCC) Active Problems:   Primary hypertension   Elevated troponin   AKI (acute kidney injury) ([place])   Type 2 diabetes mellitus ([place])   Atrial flutter ([place])     New onset atrial flutter. Sp emergent cardioversion.  Echocardiogram with preserved LV systolic function with EF 55 to 60% on left ventricle. Mild mitral regurgitation.    Continue carvedilol and apixaban, Now off telemetry   2. Left ankle pain due to osteoarthritis/ acute gout flare. Patient with persistent pain on left ankle despite topical diclofenac. Decreased mobility. Elevated uric acid suggesting acute gout flare.   Completed 3 days of prednisone and furosemide has been discontinued. Pain has improved, but she also has chronic osteoarthritis.  Pain has improved will continue with acetaminophen scheduled and as needed oxycodone.  Continue with topical dilcofenac to left ankle    3. HTN.  Blood pressure controlled with Losartan and carvedilol f   4. T2DM dyslipidemia   Continue with atorvastatin and ezetimibe  Resumed metformin for glucose control, patient is tolerating po well.    5. Obesity class 2,  BMI is 38,9      Status is: Inpatient  Remains inpatient appropriate because:Inpatient level of care appropriate due to severity of illness  Dispo: The patient is from: Home              Anticipated d/c is to: SNF              Patient currently is medically stable to d/c.   Difficult to place patient No   DVT prophylaxis: Apixaban   Code Status:    full  Family Communication:  No family at the bedside       Subjective: Patient is feeling better, no nausea or vomiting. Left ankle pain with significant improvement,   Objective: Vitals:   03/13/21 0004 03/13/21 0400 03/13/21 2006 03/14/21 0415  BP:  (!) 142/63 (!) 142/46 (!) 154/61  Pulse:  63 78 63  Resp:  '18 20 18  '$ Temp:  97.8 F (36.6 C) 97.9 F (36.6  C) 98 F (36.7 C)  TempSrc:  Oral Oral Oral  SpO2:  97% 99% 98%  Weight: 100.3 kg   101.6 kg  Height:        Intake/Output Summary (Last 24 hours) at 03/14/2021 1155 Last data filed at 03/14/2021 0900 Gross per 24 hour  Intake 560 ml  Output 1050 ml  Net -490 ml   Filed Weights   03/12/21 0523 03/13/21 0004 03/14/21 0415  Weight: 102.6 kg 100.3 kg 101.6 kg    Examination:   General: Not in pain or dyspnea Neurology: Awake and alert, non focal  E ENT: no pallor, no icterus, oral mucosa moist Cardiovascular: No JVD. S1-S2 present, rhythmic, no gallops, rubs, or murmurs. Trace lower extremity edema. Pulmonary: positive breath sounds bilaterally, with no wheezing, rhonchi or rales. Gastrointestinal. Abdomen soft and non tender Skin. Lower extremities distal dry skin.  Musculoskeletal: bilateral knee hypertrophy and ankle lateral rotatin     Data Reviewed: I have personally reviewed following labs and imaging studies  CBC: No results for input(s): WBC, NEUTROABS, HGB, HCT, MCV, PLT in the last 168 hours. Basic Metabolic Panel: Recent Labs  Lab 03/09/21 0308  NA 133*  K 3.9  CL 101  CO2 24  GLUCOSE 130*  BUN 20  CREATININE 0.96  CALCIUM 8.3*   GFR: Estimated Creatinine Clearance: 52.4 mL/min (by C-G formula based on SCr of 0.96 mg/dL). Liver Function Tests: No results for input(s): AST, ALT, ALKPHOS, BILITOT, PROT, ALBUMIN in the last 168 hours. No results for input(s): LIPASE, AMYLASE in the last 168 hours. No results for input(s): AMMONIA in the last 168 hours. Coagulation Profile: No results for input(s): INR, PROTIME in the last 168 hours. Cardiac Enzymes: No results for input(s): CKTOTAL, CKMB, CKMBINDEX, TROPONINI in the last 168 hours. BNP (last 3 results) No results for input(s): PROBNP in the last 8760 hours. HbA1C: No results for input(s): HGBA1C in the last 72 hours. CBG: Recent Labs  Lab 03/13/21 0611 03/13/21 1555 03/13/21 2109 03/14/21 0611  03/14/21 1101  GLUCAP 104* 203* 184* 104* 124*   Lipid Profile: No results for input(s): CHOL, HDL, LDLCALC, TRIG, CHOLHDL, LDLDIRECT in the last 72 hours. Thyroid Function Tests: No results for input(s): TSH, T4TOTAL, FREET4, T3FREE, THYROIDAB in the last 72 hours. Anemia Panel: No results for input(s): VITAMINB12, FOLATE, FERRITIN, TIBC, IRON, RETICCTPCT in the last 72 hours.    Radiology Studies: I have reviewed all of the imaging during this hospital visit personally     Scheduled Meds:  acetaminophen  650 mg Oral QID   apixaban  5 mg Oral BID   atorvastatin  80 mg Oral Daily   carvedilol  3.125 mg Oral BID WC   diclofenac Sodium  4 g Topical QID   ezetimibe  10 mg Oral Daily   losartan  50 mg Oral Daily   metFORMIN  500 mg Oral BID WC   Continuous Infusions:   LOS: 8 days        Margarine Grosshans Gerome Apley, MD

## 2021-03-15 DIAGNOSIS — E1140 Type 2 diabetes mellitus with diabetic neuropathy, unspecified: Secondary | ICD-10-CM | POA: Diagnosis not present

## 2021-03-15 DIAGNOSIS — M6281 Muscle weakness (generalized): Secondary | ICD-10-CM | POA: Diagnosis not present

## 2021-03-15 DIAGNOSIS — I890 Lymphedema, not elsewhere classified: Secondary | ICD-10-CM | POA: Diagnosis not present

## 2021-03-15 DIAGNOSIS — R41841 Cognitive communication deficit: Secondary | ICD-10-CM | POA: Diagnosis not present

## 2021-03-15 DIAGNOSIS — M19072 Primary osteoarthritis, left ankle and foot: Secondary | ICD-10-CM | POA: Diagnosis not present

## 2021-03-15 DIAGNOSIS — E11618 Type 2 diabetes mellitus with other diabetic arthropathy: Secondary | ICD-10-CM | POA: Diagnosis not present

## 2021-03-15 DIAGNOSIS — Z7401 Bed confinement status: Secondary | ICD-10-CM | POA: Diagnosis not present

## 2021-03-15 DIAGNOSIS — I499 Cardiac arrhythmia, unspecified: Secondary | ICD-10-CM | POA: Diagnosis not present

## 2021-03-15 DIAGNOSIS — R2689 Other abnormalities of gait and mobility: Secondary | ICD-10-CM | POA: Diagnosis not present

## 2021-03-15 DIAGNOSIS — E1169 Type 2 diabetes mellitus with other specified complication: Secondary | ICD-10-CM | POA: Diagnosis not present

## 2021-03-15 DIAGNOSIS — R2681 Unsteadiness on feet: Secondary | ICD-10-CM | POA: Diagnosis not present

## 2021-03-15 DIAGNOSIS — M10072 Idiopathic gout, left ankle and foot: Secondary | ICD-10-CM | POA: Diagnosis not present

## 2021-03-15 DIAGNOSIS — N179 Acute kidney failure, unspecified: Secondary | ICD-10-CM | POA: Diagnosis not present

## 2021-03-15 DIAGNOSIS — I4902 Ventricular flutter: Secondary | ICD-10-CM | POA: Diagnosis not present

## 2021-03-15 DIAGNOSIS — I4892 Unspecified atrial flutter: Secondary | ICD-10-CM | POA: Diagnosis not present

## 2021-03-15 DIAGNOSIS — I959 Hypotension, unspecified: Secondary | ICD-10-CM | POA: Diagnosis not present

## 2021-03-15 DIAGNOSIS — R778 Other specified abnormalities of plasma proteins: Secondary | ICD-10-CM | POA: Diagnosis not present

## 2021-03-15 DIAGNOSIS — R5383 Other fatigue: Secondary | ICD-10-CM | POA: Diagnosis not present

## 2021-03-15 DIAGNOSIS — Z20822 Contact with and (suspected) exposure to covid-19: Secondary | ICD-10-CM | POA: Diagnosis not present

## 2021-03-15 DIAGNOSIS — R5381 Other malaise: Secondary | ICD-10-CM | POA: Diagnosis not present

## 2021-03-15 DIAGNOSIS — D649 Anemia, unspecified: Secondary | ICD-10-CM | POA: Diagnosis not present

## 2021-03-15 DIAGNOSIS — I10 Essential (primary) hypertension: Secondary | ICD-10-CM | POA: Diagnosis not present

## 2021-03-15 LAB — RESP PANEL BY RT-PCR (FLU A&B, COVID) ARPGX2
Influenza A by PCR: NEGATIVE
Influenza B by PCR: NEGATIVE
SARS Coronavirus 2 by RT PCR: NEGATIVE

## 2021-03-15 LAB — GLUCOSE, CAPILLARY: Glucose-Capillary: 97 mg/dL (ref 70–99)

## 2021-03-15 MED ORDER — COVID-19 MRNA VAC-TRIS(PFIZER) 30 MCG/0.3ML IM SUSP
0.3000 mL | Freq: Once | INTRAMUSCULAR | Status: AC
  Administered 2021-03-15: 0.3 mL via INTRAMUSCULAR
  Filled 2021-03-15: qty 0.3

## 2021-03-15 MED ORDER — LOSARTAN POTASSIUM 50 MG PO TABS: 50.0000 mg | ORAL_TABLET | Freq: Every day | ORAL | 0 refills | Status: DC

## 2021-03-15 MED ORDER — EZETIMIBE 10 MG PO TABS: 10.0000 mg | ORAL_TABLET | Freq: Every day | ORAL | 0 refills | Status: DC

## 2021-03-15 MED ORDER — APIXABAN 5 MG PO TABS: 5.0000 mg | ORAL_TABLET | Freq: Two times a day (BID) | ORAL | 0 refills | Status: DC

## 2021-03-15 NOTE — TOC Transition Note (Addendum)
Transition of Care [facility]) - CM/SW Discharge Note   Patient Details  Name: Kayla [name] MRN: [id number] Date of Birth: [birth year]  Transition of Care [facility]) CM/SW Contact:  Tresa Endo Phone Number: 03/15/2021, 12:08 PM   Clinical Narrative:    Patient will DC to: [facility] Anticipated DC date: 03/15/2021 Family notified: Pt Son Transport by: Corey Harold   Per MD patient ready for DC to [facility]. RN to call report prior to discharge [phone]). RN, patient, patient's family, and facility notified of DC. Discharge Summary and FL2 sent to facility. DC packet on chart. Ambulance transport requested for patient.   CSW will sign off for now as social work intervention is no longer needed. Please consult [place] again if new needs arise.     Final next level of care: Skilled Nursing Facility Barriers to Discharge: Ship broker, SNF Pending bed offer   Patient Goals and CMS Choice Patient states their goals for this hospitalization and ongoing recovery are:: Return home CMS Medicare.gov Compare Post Acute Care list provided to:: Patient Choice offered to / list presented to : Patient  Discharge Placement                       Discharge Plan and Services                                     Social Determinants of Health (SDOH) Interventions     Readmission Risk Interventions No flowsheet data found.

## 2021-03-15 NOTE — Progress Notes (Signed)
Report called to Olu at [facility].

## 2021-03-15 NOTE — Discharge Summary (Signed)
Physician Discharge Summary  LYDIANN HAILS [id number] DOB: 07-Feb-1941 DOA: 03/04/2021  PCP: Denita Lung, MD  Admit date: 03/04/2021 Discharge date: 03/15/2021  Admitted From: Home Disposition:   SNF  Recommendations for Outpatient Follow-up and new medication changes:  Follow up with  Dr. Redmond School in 7 to 10 days.  Continue topical diclofenac for arthritis. Discontinue furosemide and hctz Placed on apixaban and carvedilol for atrial flutter.  Follow up with cardiology as outpatient.   Home Health: na   Equipment/Devices: na    Discharge Condition: stable  CODE STATUS: full  Diet recommendation:  heart healthy   Brief/Interim Summary: Mrs. Kayla [name] was admitted to the hospital with the working diagnosis of new onset atrial flutter.    80 year old female past medical history for type 2 diabetes mellitus, hypertension, history of PE, obesity, chronic venous insufficiency/lymphedema, and chronic right lower extremity wound who presented with palpitations.  Patient had a sudden episode of diaphoresis, likely palpitations, her family called EMS and she was found in atrial flutter with rapid ventricular response.  Her blood pressure was 80s over 50s and she was transported to the hospital.  In the emergency department due to hemodynamic instability she underwent emergent direct-current cardioversion, converting to sinus rhythm.  Post cardioversion blood pressure 144/78, heart rate 80, respiratory rate 20, temperature 97.4, oxygen saturation 99%, lungs were clear to auscultation bilaterally, heart S1-S2, present, rhythmic, abdomen soft, positive lower extremity edema.   Sodium 138, potassium 4.2, chloride 107, bicarb 21, glucose 163, BUN 32, creatinine 1.62, magnesium 1.9, high sensitive troponin [phone], white count 5.3, hemoglobin 9.6, hematocrit 31.4, platelets 237. SARS COVID-19 negative.   Chest radiograph no infiltrates CT chest with faint groundglass opacities bilaterally,  negative for pulm embolism.   EKG 147 bpm, normal axis, normal QTC, SVT/atrial flutter rhythm, no significant ST segment or T wave changes. (Prior to cardioversion)   Patient has remained on sinus rhythm after cardioversion, reate control with B blockade and tolerating well anticoagulation with apixaban.    08/10 patient had a syncope episode, while having worsening left ankle pain with passive mobility.  Possible vaso vagal in nature.    Persistent left ankle pain, worse with movement, diagnosed with acute gout flare. Received 3 days of prednisone with improvement of her symptoms.    Patient very weak and deconditioned, plan to transfer to SNF  New onset atrial flutter.  Patient has remained in sinus rhythm, further work-up with echocardiography showed left ventricle ejection fraction 55 to 60%.  Mild mitral regurgitation. She has tolerated well carvedilol and apixaban.  2.  Left ankle pain due to osteoarthritis, acute gout flare.  Patient had persistent pain at her left ankle.  Uric acid was found to be elevated. Patient received 3 days of oral prednisone, along with topical diclofenac. For severe pain she required oxycodone.  Furosemide has been discontinued.   3.  Hypertension.  Her blood pressure remained well controlled with losartan and carvedilol.  4.  Type 2 diabetes mellitus, dyslipidemia.  Continue atorvastatin and ezetimibe. Her glucose remained well controlled metformin.  5.  Obesity class II.  Her calculated BMI is 38.9.   Discharge Diagnoses:  Principal Problem:   Atrial flutter with rapid ventricular response (HCC) Active Problems:   Primary hypertension   Elevated troponin   AKI (acute kidney injury) ([place])   Type 2 diabetes mellitus (HCC)   Atrial flutter ([place])    Discharge Instructions   Allergies as of 03/15/2021   No Known Allergies  Medication List     STOP taking these medications    aspirin 325 MG EC tablet Commonly known as: [facility]  Aspirin EC   losartan-hydrochlorothiazide 100-12.5 MG tablet Commonly known as: HYZAAR       TAKE these medications    Accu-Chek Softclix Lancets lancets 1 each by Other route daily. Use as instructed   acetaminophen 650 MG CR tablet Commonly known as: TYLENOL Take 650 mg by mouth every 8 (eight) hours as needed for pain.   ammonium lactate 12 % lotion Commonly known as: LAC-HYDRIN Apply 1 application topically as needed for dry skin.   apixaban 5 MG Tabs tablet Commonly known as: ELIQUIS Take 1 tablet (5 mg total) by mouth 2 (two) times daily.   atorvastatin 80 MG tablet Commonly known as: LIPITOR Take 1 tablet (80 mg total) by mouth daily.   carvedilol 3.125 MG tablet Commonly known as: COREG Take 1 tablet (3.125 mg total) by mouth 2 (two) times daily.   diclofenac Sodium 1 % Gel Commonly known as: VOLTAREN Apply 2 g topically 4 (four) times daily. Rub into affected area of foot 2 to 4 times daily   ezetimibe 10 MG tablet Commonly known as: ZETIA Take 1 tablet (10 mg total) by mouth daily.   glucose blood test strip 1 each by Other route as needed for other. Use as instructed   losartan 50 MG tablet Commonly known as: COZAAR Take 1 tablet (50 mg total) by mouth daily.   metFORMIN 500 MG tablet Commonly known as: GLUCOPHAGE Take 1 tablet (500 mg total) by mouth 2 (two) times daily with a meal.   TRIAMCINOLONE ACETONIDE (TOP) 0.05 % Oint Apply to rough skin on heels twice daily What changed:  how much to take how to take this when to take this reasons to take this additional instructions        Follow-up Information     Werner Lean, MD Follow up on 03/31/2021.   Specialty: Cardiology Why: at 2:20 PM for your post hospital follow up appointment with cardiology Contact information: [place] [address] [phone]         Denita Lung, MD Follow up in 1 week(s).   Specialty: Family Medicine Contact  information: [address] [place] [address] [phone]                No Known Allergies  Consultations: Cardiology    Procedures/Studies: DG Ankle 2 Views Left  Result Date: 03/10/2021 CLINICAL DATA:  Left ankle pain without recent injury. EXAM: LEFT ANKLE - 2 VIEW COMPARISON:  July 02, 2006. FINDINGS: There is no evidence of fracture, dislocation, or joint effusion. There is no evidence of arthropathy or other focal bone abnormality. Soft tissues are unremarkable. IMPRESSION: Negative. Electronically Signed   By: Marijo Conception M.D.   On: 03/10/2021 15:39   CT Angio Chest Pulmonary Embolism (PE) W or WO Contrast  Result Date: 03/05/2021 CLINICAL DATA:  PE suspected, chest discomfort EXAM: CT ANGIOGRAPHY CHEST WITH CONTRAST TECHNIQUE: Multidetector CT imaging of the chest was performed using the standard protocol during bolus administration of intravenous contrast. Multiplanar CT image reconstructions and MIPs were obtained to evaluate the vascular anatomy. CONTRAST:  60 mL OMNIPAQUE IOHEXOL 350 MG/ML SOLN COMPARISON:  CT 11/25/2016 FINDINGS: Cardiovascular: Satisfactory opacification of pulmonary arteries to the segmental level. Evaluation is somewhat limited by respiratory motion artifact which may limit detection of smaller distal segmental and subsegmental filling defects.  No central, lobar or proximal segmental filling defects are identified. Central pulmonary arteries are top-normal caliber. Cardiac size top normal with some mild right atrial enlargement. Three-vessel coronary artery atherosclerosis is seen. No pericardial effusion. Calcifications present on the aortic leaflets. Atherosclerotic plaque within the normal caliber aorta. No clear acute luminal abnormality of the aorta within limitations of suboptimal opacification and technique. Normal 3 vessel branching of the aortic arch without concerning vascular abnormality of the branch vessels.  Mediastinum/Nodes: No mediastinal fluid or gas. Normal thyroid gland and thoracic inlet. No acute abnormality of the trachea or esophagus. No worrisome mediastinal or axillary adenopathy. Hilar nodal evaluation is limited in the absence of intravenous contrast media. Lungs/Pleura: Low volumes and atelectasis. Patchy areas of more mosaic attenuation towards the lung bases, can reflect further volume loss, air trapping or small airways disease. Additional bandlike areas of subsegmental atelectasis and/or scarring noted towards the lung bases. No convincing CT features of pulmonary edema. No concerning pulmonary nodules or masses. Mild pulmonary vascular redistribution. No clear features of pulmonary edema. Upper Abdomen: No acute abnormalities present in the visualized portions of the upper abdomen. Musculoskeletal: The osseous structures appear diffusely demineralized which may limit detection of small or nondisplaced fractures. No acute osseous abnormality or suspicious osseous lesion. Multilevel degenerative changes are present in the imaged portions of the spine. Exaggerated thoracic kyphosis. Multilevel flowing anterior osteophytosis, compatible with features of diffuse idiopathic skeletal hyperostosis (DISH). Additional degenerative changes in the shoulders. No worrisome chest wall mass or lesion. Review of the MIP images confirms the above findings. IMPRESSION: Satisfactory opacification of the pulmonary arteries. No clear central, lobar or proximal segmental filling defects. More distal evaluation limited by respiratory motion artifact. Low volumes and atelectasis. Mosaic ground-glass towards the lung bases, could reflect further atelectatic volume loss versus underlying air trapping or small airways disease. No consolidative process. Mild pulmonary vascular congestion. No evidence of frank pulmonary edema. Right atrial enlargement. Three-vessel coronary artery atherosclerosis. Aortic Atherosclerosis  (ICD10-I70.0). Osseous manifestations of diffuse idiopathic skeletal hyperostosis. Electronically Signed   By: Lovena Le M.D.   On: 03/05/2021 03:33   NM Myocar Multi W/Spect W/Wall Motion / EF  Result Date: 03/06/2021  There was no ST segment deviation noted during stress.  Findings consistent with ischemia.  This is an intermediate risk study.  The left ventricular ejection fraction is hyperdynamic (>65%).  Nuclear stress EF: 66%.  There is a medium size defect of mild severity present in the basal anterior, mid anterior, apical anterior and apex location. Grossly normal wall motion. LVEF 66%. Findings suggest ischemia in LAD distribution. Intermediate risk study due to TID 1.58 and medium size defect.   CT CORONARY MORPH W/CTA COR W/SCORE W/CA W/CM &/OR WO/CM  Addendum Date: 03/07/2021   ADDENDUM REPORT: 03/07/2021 12:51 CLINICAL DATA:  Chest pain EXAM: Cardiac/Coronary CTA TECHNIQUE: A non-contrast, gated CT scan was obtained with axial slices of 3 mm through the heart for calcium scoring. Calcium scoring was performed using the Agatston method. A 120 kV prospective, gated, contrast cardiac scan was obtained. Gantry rotation speed was 250 msecs and collimation was 0.6 mm. Two sublingual nitroglycerin tablets (0.8 mg) were given. The 3D data set was reconstructed in 5% intervals of the 35-75% of the R-R cycle. Diastolic phases were analyzed on a dedicated workstation using MPR, MIP, and VRT modes. The patient received 95 cc of contrast. FINDINGS: Image quality: Poor quality scan with significant stair-step artifact. Irregular heart rhythm during scan. Noise artifact is: Limited. Coronary  Arteries:  Normal coronary origin.  Left dominance. Left main: The left main is a large caliber vessel with a normal take off from the left coronary cusp that bifurcates to form a left anterior descending artery and a left circumflex artery. There is minimal calcified plaque (<25%). Left anterior descending artery:  There are heavily calcifications of the proximal to mid LAD. There is stair step artifact in this region and cannot be overcome by multi-phase assessment. This segment is non-diagnostic, and cannot exclude significant stenosis. The distal LAD appears patent. Left circumflex artery: The LCX is dominant and appears patent with no evidence of plaque or stenosis. Significant stair step artifact noted. The LCX terminates as a patent PDA. OM1 is heavily calcified and contains at least a moderate (50-69%) calcified stenosis. Right coronary artery: The RCA is non-dominant with normal take off from the right coronary cusp. The ostial RCA is heavily calcified and cannot exclude a severe stenosis (70-99%). Right Atrium: Right atrial size is within normal limits. Right Ventricle: The right ventricular cavity is within normal limits. Left Atrium: Left atrial size is normal in size with no left atrial appendage filling defect. Left Ventricle: The ventricular cavity size is within normal limits. There are no stigmata of prior infarction. There is no abnormal filling defect. Pulmonary arteries: Normal in size without proximal filling defect. Pulmonary veins: Normal pulmonary venous drainage. Pericardium: Normal thickness with no significant effusion or calcium present. Cardiac valves: The aortic valve is trileaflet without significant calcification. The mitral valve is normal structure without significant calcification. Aorta: Normal caliber with no significant disease. Extra-cardiac findings: See attached radiology report for non-cardiac structures. IMPRESSION: 1. Coronary calcium score of 620. This was 89th percentile for age-, sex, and race-matched controls. 2. Normal coronary origin with left dominance. 3. Poor quality study with poor heart rate control/irregular rhythm during exam. 4. Non-diagnostic proximal to mid LAD due to stair step artifact. Heavily calcified artery and cannot exclude moderate to severe stenosis. 5.  Heavily calcified OM1 with concern for moderate stenosis (50-69%). 6. Heavily calcified ostial lesion of the non-dominant RCA and cannot exclude severe stenosis (70-99%). 7. CT FFR not sent as motion prevents accurate assessment. RECOMMENDATIONS: 1. Non-diagnostic study. Obstructive CAD can't be excluded. Would recommend left heart catheterization. Eleonore Chiquito, MD Electronically Signed   By: Eleonore Chiquito   On: 03/07/2021 12:51   Result Date: 03/07/2021 EXAM: OVER-READ INTERPRETATION  CT CHEST The following report is an over-read performed by radiologist Dr. Markus Daft of [facility], [place] on 03/07/2021. This over-read does not include interpretation of cardiac or coronary anatomy or pathology. The coronary calcium score/coronary CTA interpretation by the cardiologist is attached. COMPARISON:  Chest CTA 03/05/2021 and chest CT 11/25/2016 FINDINGS: Vascular: Visualized pulmonary arteries are patent. Atherosclerotic calcifications involving the thoracic aorta. Mediastinum/Nodes: Visualized mediastinal structures are normal. Lungs/Pleura: No large pleural effusions. Peripheral patchy densities in lower lungs are suggestive atelectasis or scarring. No large areas of consolidation. No large pleural effusions. 3 mm nodular density in the left lower lobe on sequence 11, image 18 is along the left major fissure. This nodule appears to be stable since 2018 and most compatible with a benign nodule. Upper Abdomen: Images of the upper abdomen are unremarkable. Musculoskeletal: Bridging osteophytes in the visualized thoracic spine. No acute bone abnormality. IMPRESSION: No acute abnormality involving the extracardiac structures. Aortic Atherosclerosis (ICD10-I70.0). Electronically Signed: By: Markus Daft M.D. On: 03/07/2021 11:03   DG Chest Port 1 View  Result Date: 03/05/2021 CLINICAL DATA:  Hypotension EXAM: PORTABLE CHEST 1 VIEW COMPARISON:  Radiograph 02/08/2018 FINDINGS: Low volumes. Mild pulmonary vascular  congestion. Some hazy opacities towards the lung base no pneumothorax or layering effusion. No consolidative process. The aorta is calcified. The remaining cardiomediastinal contours are unremarkable. Could reflect atelectasis and/or early interstitial edema with few peripheral septal lines. IMPRESSION: Low lung volumes. Streaky basilar opacities, may reflect atelectasis and/or early edema given some faint septal lines and vascular congestion. Aortic Atherosclerosis (ICD10-I70.0). Electronically Signed   By: Lovena Le M.D.   On: 03/05/2021 01:37   ECHOCARDIOGRAM COMPLETE  Result Date: 03/05/2021    ECHOCARDIOGRAM REPORT   Patient Name:   [name] Date of Exam: 03/05/2021 Medical Rec #:  [id number]       Height:       64.0 in Accession #:    [id number]      Weight:       229.9 lb Date of Birth:  Oct 03, 1940        BSA:          2.076 m Patient Age:    15 years        BP:           155/62 mmHg Patient Gender: F               HR:           82 bpm. Exam Location:  Inpatient Procedure: 2D Echo, Cardiac Doppler and Color Doppler Indications:    Atrial flutter with rapid ventricular response  History:        Patient has prior history of Echocardiogram examinations, most                 recent 11/15/2017. Risk Factors:Hypertension and Diabetes. Hx                 pulmonary embolus.  Sonographer:    Clayton Lefort RDCS (AE) Referring Phys: [id number] [place]  1. Left ventricular ejection fraction, by estimation, is 55 to 60%. The left ventricle has normal function. The left ventricle has no regional wall motion abnormalities. There is mild left ventricular hypertrophy. Left ventricular diastolic parameters are indeterminate.  2. Right ventricular systolic function is normal. The right ventricular size is normal. There is mildly elevated pulmonary artery systolic pressure.  3. Mild mitral valve regurgitation.  4. The aortic valve is tricuspid. Aortic valve regurgitation is not visualized. Mild to  moderate aortic valve sclerosis/calcification is present, without any evidence of aortic stenosis.  5. The inferior vena cava is normal in size with <50% respiratory variability, suggesting right atrial pressure of 8 mmHg. Comparison(s): The left ventricular function is unchanged. FINDINGS  Left Ventricle: Left ventricular ejection fraction, by estimation, is 55 to 60%. The left ventricle has normal function. The left ventricle has no regional wall motion abnormalities. The left ventricular internal cavity size was normal in size. There is  mild left ventricular hypertrophy. Left ventricular diastolic parameters are indeterminate. Right Ventricle: The right ventricular size is normal. Right vetricular wall thickness was not assessed. Right ventricular systolic function is normal. There is mildly elevated pulmonary artery systolic pressure. The tricuspid regurgitant velocity is 2.94 m/s, and with an assumed right atrial pressure of 8 mmHg, the estimated right ventricular systolic pressure is [id number]. Left Atrium: Left atrial size was normal in size. Right Atrium: Right atrial size was normal in size. Pericardium: There is no evidence of pericardial effusion. Mitral Valve: There is mild thickening of the mitral  valve leaflet(s). Mild mitral annular calcification. Mild mitral valve regurgitation. MV peak gradient, 3.8 mmHg. The mean mitral valve gradient is 2.0 mmHg. Tricuspid Valve: The tricuspid valve is normal in structure. Tricuspid valve regurgitation is mild. Aortic Valve: The aortic valve is tricuspid. Aortic valve regurgitation is not visualized. Mild to moderate aortic valve sclerosis/calcification is present, without any evidence of aortic stenosis. Aortic valve mean gradient measures 5.0 mmHg. Aortic valve peak gradient measures 8.8 mmHg. Aortic valve area, by VTI measures 2.52 cm. Pulmonic Valve: The pulmonic valve was not well visualized. Pulmonic valve regurgitation is not visualized. No evidence of  pulmonic stenosis. Aorta: The aortic root and ascending aorta are structurally normal, with no evidence of dilitation. Venous: The inferior vena cava is normal in size with less than 50% respiratory variability, suggesting right atrial pressure of 8 mmHg. IAS/Shunts: No atrial level shunt detected by color flow Doppler.  LEFT VENTRICLE PLAX 2D LVIDd:         3.80 cm  Diastology LVIDs:         2.70 cm  LV e' medial:    7.18 cm/s LV PW:         1.40 cm  LV E/e' medial:  11.8 LV IVS:        1.50 cm  LV e' lateral:   8.38 cm/s LVOT diam:     2.10 cm  LV E/e' lateral: 10.1 LV SV:         77 LV SV Index:   37 LVOT Area:     3.46 cm  RIGHT VENTRICLE             IVC RV Basal diam:  3.10 cm     IVC diam: 1.70 cm RV S prime:     19.90 cm/s TAPSE (M-mode): 2.0 cm LEFT ATRIUM             Index       RIGHT ATRIUM           Index LA diam:        3.40 cm 1.64 cm/m  RA Area:     14.70 cm LA Vol (A2C):   32.6 ml 15.70 ml/m RA Volume:   35.20 ml  16.96 ml/m LA Vol (A4C):   49.1 ml 23.65 ml/m LA Biplane Vol: 39.8 ml 19.17 ml/m  AORTIC VALVE AV Area (Vmax):    2.39 cm AV Area (Vmean):   2.60 cm AV Area (VTI):     2.52 cm AV Vmax:           148.00 cm/s AV Vmean:          101.000 cm/s AV VTI:            0.306 m AV Peak Grad:      8.8 mmHg AV Mean Grad:      5.0 mmHg LVOT Vmax:         102.00 cm/s LVOT Vmean:        75.700 cm/s LVOT VTI:          0.223 m LVOT/AV VTI ratio: 0.73  AORTA Ao Root diam: 2.80 cm Ao Asc diam:  3.00 cm MITRAL VALVE                TRICUSPID VALVE MV Area (PHT): 3.03 cm     TR Peak grad:   34.6 mmHg MV Area VTI:   3.16 cm     TR Vmax:        294.00 cm/s MV  Peak grad:  3.8 mmHg MV Mean grad:  2.0 mmHg     SHUNTS MV Vmax:       0.98 m/s     Systemic VTI:  0.22 m MV Vmean:      62.7 cm/s    Systemic Diam: 2.10 cm MV Decel Time: 250 msec MV E velocity: 84.90 cm/s MV A velocity: 111.00 cm/s MV E/A ratio:  0.76 Dorris Carnes MD Electronically signed by Dorris Carnes MD Signature Date/Time: 03/05/2021/5:08:43 PM     Final      Procedures: dc cardioversion in the ED   Subjective: Patient is feeling better, no nausea or vomiting, left ankle pain is controlled.   Discharge Exam: Vitals:   03/14/21 1938 03/15/21 0427  BP: (!) 121/56 (!) 151/57  Pulse: 66 (!) 58  Resp: 20 16  Temp: 98.7 F (37.1 C) 97.7 F (36.5 C)  SpO2: 95% 98%   Vitals:   03/14/21 1642 03/14/21 1938 03/15/21 0029 03/15/21 0427  BP: (!) 133/57 (!) 121/56  (!) 151/57  Pulse: 75 66  (!) 58  Resp:  20  16  Temp: 98.6 F (37 C) 98.7 F (37.1 C)  97.7 F (36.5 C)  TempSrc: Oral Oral  Oral  SpO2: 98% 95%  98%  Weight:   102 kg   Height:        General: Not in pain or dyspnea Neurology: Awake and alert, non focal  E ENT: no pallor, no icterus, oral mucosa moist Cardiovascular: No JVD. S1-S2 present, rhythmic, with no rubs. Trace non pitting bilateral lower extremity edema. Pulmonary: positive breath sounds bilaterally, adequate air movement, no wheezing, rhonchi or rales. Gastrointestinal. Abdomen soft and non tender Skin. No rashes, dry skin lower extremities  Musculoskeletal: hypertrophic knees. And positive ankle joint arthrotic deformities.    The results of significant diagnostics from this hospitalization (including imaging, microbiology, ancillary and laboratory) are listed below for reference.     Microbiology: No results found for this or any previous visit (from the past 240 hour(s)).   Labs: BNP (last 3 results) Recent Labs    03/04/21 2353  BNP [id number]   Basic Metabolic Panel: Recent Labs  Lab 03/09/21 0308  NA 133*  K 3.9  CL 101  CO2 24  GLUCOSE 130*  BUN 20  CREATININE 0.96  CALCIUM 8.3*   Liver Function Tests: No results for input(s): AST, ALT, ALKPHOS, BILITOT, PROT, ALBUMIN in the last 168 hours. No results for input(s): LIPASE, AMYLASE in the last 168 hours. No results for input(s): AMMONIA in the last 168 hours. CBC: No results for input(s): WBC, NEUTROABS, HGB, HCT, MCV, PLT in  the last 168 hours. Cardiac Enzymes: No results for input(s): CKTOTAL, CKMB, CKMBINDEX, TROPONINI in the last 168 hours. BNP: Invalid input(s): POCBNP CBG: Recent Labs  Lab 03/13/21 2109 03/14/21 0611 03/14/21 1101 03/14/21 1641 03/15/21 0523  GLUCAP 184* 104* 124* 112* 97   D-Dimer No results for input(s): DDIMER in the last 72 hours. Hgb A1c No results for input(s): HGBA1C in the last 72 hours. Lipid Profile No results for input(s): CHOL, HDL, LDLCALC, TRIG, CHOLHDL, LDLDIRECT in the last 72 hours. Thyroid function studies No results for input(s): TSH, T4TOTAL, T3FREE, THYROIDAB in the last 72 hours.  Invalid input(s): FREET3 Anemia work up No results for input(s): VITAMINB12, FOLATE, FERRITIN, TIBC, IRON, RETICCTPCT in the last 72 hours. Urinalysis    Component Value Date/Time   COLORURINE YELLOW 12/01/2010 [place] 12/01/2010 2352  LABSPEC 1.019 12/01/2010 2352   PHURINE 5.5 12/01/2010 2352   GLUCOSEU NEGATIVE 12/01/2010 2352   HGBUR NEGATIVE 12/01/2010 2352   BILIRUBINUR n 11/27/2016 1129   KETONESUR NEGATIVE 12/01/2010 2352   PROTEINUR +- 11/27/2016 1129   PROTEINUR NEGATIVE 12/01/2010 2352   UROBILINOGEN negative (A) 11/27/2016 1129   UROBILINOGEN 0.2 12/01/2010 2352   NITRITE n 11/27/2016 1129   NITRITE NEGATIVE 12/01/2010 2352   LEUKOCYTESUR Negative 11/27/2016 1129   Sepsis Labs Invalid input(s): PROCALCITONIN,  WBC,  LACTICIDVEN Microbiology No results found for this or any previous visit (from the past 240 hour(s)).   Time coordinating discharge: 45 minutes  SIGNED:   Tawni Millers, MD  [facility] 03/15/2021, 9:11 AM

## 2021-03-16 ENCOUNTER — Telehealth: Payer: Self-pay

## 2021-03-16 DIAGNOSIS — E1140 Type 2 diabetes mellitus with diabetic neuropathy, unspecified: Secondary | ICD-10-CM | POA: Diagnosis not present

## 2021-03-16 DIAGNOSIS — R5381 Other malaise: Secondary | ICD-10-CM | POA: Diagnosis not present

## 2021-03-16 DIAGNOSIS — I4892 Unspecified atrial flutter: Secondary | ICD-10-CM | POA: Diagnosis not present

## 2021-03-16 DIAGNOSIS — E1169 Type 2 diabetes mellitus with other specified complication: Secondary | ICD-10-CM | POA: Diagnosis not present

## 2021-03-16 NOTE — Telephone Encounter (Signed)
Transition Care Management Follow-up Telephone Call Date of discharge and from where: [name] 03/15/21 How have you been since you were released from the hospital? Released to rehab facility [facility]  Any questions or concerns? No  Items Reviewed: Did the pt receive and understand the discharge instructions provided? Yes  Medications obtained and verified? Yes  Other? No  Any new allergies since your discharge? No  Dietary orders reviewed? No Do you have support at home? Yes   Home Care and Equipment/Supplies: Were home health services ordered? no  Functional Questionnaire: (I = Independent and D = Dependent) ADLs: I  Bathing/Dressing- I  Meal Prep- I  Eating- I  Maintaining continence- I  Transferring/Ambulation- I  Managing Meds- I  Follow up appointments reviewed:  PCP Hospital f/u appt confirmed? No   Pt. Released to rehab [employer] she will call back once out to shedule a hospital f/u here. [facility] f/u appt confirmed? Yes  Scheduled to see Dr. Gasper Sells on 03/31/21 @ 2;20. Are transportation arrangements needed? No  If their condition worsens, is the pt aware to call PCP or go to the Emergency Dept.? Yes Was the patient provided with contact information for the PCP's office or ED? Yes Was to pt encouraged to call back with questions or concerns? Yes

## 2021-03-18 ENCOUNTER — Ambulatory Visit: Payer: Medicare PPO

## 2021-03-22 ENCOUNTER — Other Ambulatory Visit: Payer: Self-pay | Admitting: Family Medicine

## 2021-03-24 DIAGNOSIS — E1140 Type 2 diabetes mellitus with diabetic neuropathy, unspecified: Secondary | ICD-10-CM | POA: Diagnosis not present

## 2021-03-24 DIAGNOSIS — R5381 Other malaise: Secondary | ICD-10-CM | POA: Diagnosis not present

## 2021-03-24 DIAGNOSIS — I4892 Unspecified atrial flutter: Secondary | ICD-10-CM | POA: Diagnosis not present

## 2021-03-24 DIAGNOSIS — E1169 Type 2 diabetes mellitus with other specified complication: Secondary | ICD-10-CM | POA: Diagnosis not present

## 2021-03-26 ENCOUNTER — Other Ambulatory Visit: Payer: Self-pay | Admitting: Family Medicine

## 2021-03-26 DIAGNOSIS — E1159 Type 2 diabetes mellitus with other circulatory complications: Secondary | ICD-10-CM

## 2021-03-26 DIAGNOSIS — I152 Hypertension secondary to endocrine disorders: Secondary | ICD-10-CM

## 2021-03-28 ENCOUNTER — Other Ambulatory Visit: Payer: Self-pay | Admitting: Family Medicine

## 2021-03-28 DIAGNOSIS — I152 Hypertension secondary to endocrine disorders: Secondary | ICD-10-CM

## 2021-03-28 DIAGNOSIS — R5381 Other malaise: Secondary | ICD-10-CM | POA: Diagnosis not present

## 2021-03-28 DIAGNOSIS — E1169 Type 2 diabetes mellitus with other specified complication: Secondary | ICD-10-CM | POA: Diagnosis not present

## 2021-03-28 DIAGNOSIS — E1140 Type 2 diabetes mellitus with diabetic neuropathy, unspecified: Secondary | ICD-10-CM | POA: Diagnosis not present

## 2021-03-28 DIAGNOSIS — I4892 Unspecified atrial flutter: Secondary | ICD-10-CM | POA: Diagnosis not present

## 2021-03-29 NOTE — Telephone Encounter (Signed)
Has upcoming appt °

## 2021-03-31 ENCOUNTER — Encounter: Payer: Self-pay | Admitting: Internal Medicine

## 2021-03-31 ENCOUNTER — Ambulatory Visit: Payer: Medicare PPO | Admitting: Internal Medicine

## 2021-03-31 ENCOUNTER — Telehealth: Payer: Self-pay | Admitting: Family Medicine

## 2021-03-31 ENCOUNTER — Other Ambulatory Visit: Payer: Self-pay

## 2021-03-31 ENCOUNTER — Other Ambulatory Visit: Payer: Self-pay | Admitting: Medical

## 2021-03-31 VITALS — BP 137/67 | HR 82 | Ht 64.0 in | Wt 200.4 lb

## 2021-03-31 DIAGNOSIS — I10 Essential (primary) hypertension: Secondary | ICD-10-CM | POA: Diagnosis not present

## 2021-03-31 DIAGNOSIS — I4892 Unspecified atrial flutter: Secondary | ICD-10-CM

## 2021-03-31 DIAGNOSIS — Z86711 Personal history of pulmonary embolism: Secondary | ICD-10-CM

## 2021-03-31 DIAGNOSIS — L84 Corns and callosities: Secondary | ICD-10-CM

## 2021-03-31 DIAGNOSIS — R609 Edema, unspecified: Secondary | ICD-10-CM | POA: Diagnosis not present

## 2021-03-31 MED ORDER — AMMONIUM LACTATE 12 % EX LOTN: 1.0000 "application " | TOPICAL_LOTION | CUTANEOUS | 0 refills | Status: DC | PRN

## 2021-03-31 MED ORDER — DICLOFENAC SODIUM 1 % EX GEL: 2.0000 g | Freq: Four times a day (QID) | CUTANEOUS | 0 refills | Status: DC

## 2021-03-31 MED ORDER — TRIAMCINOLONE ACETONIDE 0.05 % EX OINT: 1.0000 "application " | TOPICAL_OINTMENT | Freq: Two times a day (BID) | CUTANEOUS | 0 refills | Status: DC | PRN

## 2021-03-31 MED ORDER — NITROGLYCERIN 0.4 MG SL SUBL: SUBLINGUAL_TABLET | SUBLINGUAL | 3 refills | Status: DC

## 2021-03-31 MED ORDER — NITROGLYCERIN 0.4 MG SL SUBL: SUBLINGUAL_TABLET | SUBLINGUAL | 3 refills | Status: AC

## 2021-03-31 MED ORDER — CARVEDILOL 6.25 MG PO TABS: 6.2500 mg | ORAL_TABLET | Freq: Two times a day (BID) | ORAL | 3 refills | Status: DC

## 2021-03-31 MED ORDER — FUROSEMIDE 20 MG PO TABS: 20.0000 mg | ORAL_TABLET | Freq: Every day | ORAL | 3 refills | Status: DC | PRN

## 2021-03-31 NOTE — Telephone Encounter (Signed)
Pt called for refills. She has just been released from nursing home rehab. Pt needs lac-hydrin 12% lotion, triamcinolone acetonide top 0.05% oint and Voltaren 1% gel. Please send to [place] Pt can be reached at [phone]. Sending to [employer] as [employer] out of office

## 2021-03-31 NOTE — Patient Instructions (Addendum)
Medication Instructions:  Your physician has recommended you make the following change in your medication:  INCREASE: carvedilol (Coreg) to 6.25 mg by mouth twice daily   START: furosemide 20 mg by mouth daily as needed for swelling  START: Nitroglycerin 0.4 mg under your tongue as needed for chest pain If you have Chest pain place 1 tablet under your tongue  Wait 5 min. If chest pain continues  Place a 2nd tablet under your tongue  Wait 5 min. If chest pain continues Place a 3rd tablet under your tongue  Wait 5 min if chest pain continues CALL 911 *If you need a refill on your cardiac medications before your next appointment, please call your pharmacy*   Lab Work: NONE If you have labs (blood work) drawn today and your tests are completely normal, you will receive your results only by: [place] (if you have MyChart) OR A paper copy in the mail If you have any lab test that is abnormal or we need to change your treatment, we will call you to review the results.   Testing/Procedures: NONE   Follow-Up: At [facility], you and your health needs are our priority.  As part of our continuing mission to provide you with exceptional heart care, we have created designated Provider Care Teams.  These Care Teams include your primary Cardiologist (physician) and Advanced Practice Providers (APPs -  Physician Assistants and Nurse Practitioners) who all work together to provide you with the care you need, when you need it.  We recommend signing up for the patient portal called "MyChart".  Sign up information is provided on this After Visit Summary.  MyChart is used to connect with patients for Virtual Visits (Telemedicine).  Patients are able to view lab/test results, encounter notes, upcoming appointments, etc.  Non-urgent messages can be sent to your provider as well.   To learn more about what you can do with MyChart, go to [email or web address].    Your next appointment:   3  -48months)  The format for your next appointment:   In Person  Provider:   You may see MRudean Haskell MD or one of the following Advanced Practice Providers on your designated Care Team:   DMelina Copa PA-C MErmalinda Barrios PA-C

## 2021-03-31 NOTE — Progress Notes (Signed)
Cardiology Office Note:    Date:  03/31/2021   ID:  [name], DOB [birth year], MRN [id number]  PCP:  Denita Lung, MD   [facility] Providers Cardiologist:  Elouise Munroe, MD     Referring MD: Denita Lung, MD   CC:  Follow up AFL and NSTEMI.  History of Present Illness:    Kayla [name] is a 80 y.o. female with a hx of Bradycardia, HTN, hx of PE. Last seen in 2019.  Had recent evaluation 03/15/2021 for AFL RVR with troponin as high as 1300.  Through course we had discussed medical management vs LHC and at that time has planned on medical management.  Seen 03/31/21  Patient notes that she is doing well.  Left the hospital and was working on getting his strength back.  Able to do arm and leg exercise.  Eating right.  Is getting improved strength. No chest pain or pressure .  No SOB/DOE and no PND/Orthopnea.  No weight gain or leg swelling.  No palpitations or syncope .   Past Medical History:  Diagnosis Date   Arthritis    Diabetes mellitus    Hypertension    Obesity    Pulmonary embolus (HCC)     Past Surgical History:  Procedure Laterality Date   ABDOMINAL HYSTERECTOMY     CHOLECYSTECTOMY     JOINT REPLACEMENT  Right    Current Medications: Current Meds  Medication Sig   Accu-Chek Softclix Lancets lancets 1 each by Other route daily. Use as instructed   acetaminophen (TYLENOL) 650 MG CR tablet Take 650 mg by mouth every 8 (eight) hours as needed for pain.   ammonium lactate (LAC-HYDRIN) 12 % lotion Apply 1 application topically as needed for dry skin.   apixaban (ELIQUIS) 5 MG TABS tablet Take 1 tablet (5 mg total) by mouth 2 (two) times daily.   atorvastatin (LIPITOR) 80 MG tablet Take 1 tablet (80 mg total) by mouth daily.   carvedilol (COREG) 3.125 MG tablet TAKE 1 TABLET BY MOUTH 2 TIMES DAILY.   diclofenac Sodium (VOLTAREN) 1 % GEL Apply 2 g topically 4 (four) times daily. Rub into affected area of foot 2 to 4 times daily   ezetimibe (ZETIA) 10 MG  tablet Take 1 tablet (10 mg total) by mouth daily.   glucose blood test strip 1 each by Other route as needed for other. Use as instructed   losartan (COZAAR) 50 MG tablet Take 1 tablet (50 mg total) by mouth daily.   metFORMIN (GLUCOPHAGE) 500 MG tablet Take 1 tablet (500 mg total) by mouth 2 (two) times daily with a meal.   TRIAMCINOLONE ACETONIDE, TOP, 0.05 % OINT Apply 1 application topically 2 (two) times daily as needed (rough skin on heels).     Allergies:   Patient has no known allergies.   Social History   Socioeconomic History   Marital status: Widowed    Spouse name: Not on file   Number of children: Not on file   Years of education: Not on file   Highest education level: Not on file  Occupational History   Not on file  Tobacco Use   Smoking status: Never   Smokeless tobacco: Never  Vaping Use   Vaping Use: Never used  Substance and Sexual Activity   Alcohol use: No   Drug use: No   Sexual activity: Not Currently  Other Topics Concern   Not on file  Social History Narrative  Not on file   Social Determinants of Health   Financial Resource Strain: Not on file  Food Insecurity: Not on file  Transportation Needs: Not on file  Physical Activity: Not on file  Stress: Not on file  Social Connections: Not on file     Family History: The patient's family history includes Hypertension in her mother.  ROS:   Please see the history of present illness.    All other systems reviewed and are negative.  EKGs/Labs/Other Studies Reviewed:    The following studies were reviewed today:  EKG:  EKG is  ordered today.  The ekg ordered today demonstrates  03/31/21: SR rate 80 LVH  Transthoracic Echocardiogram: Date: 03/05/2021 Results:  1. Left ventricular ejection fraction, by estimation, is 55 to 60%. The  left ventricle has normal function. The left ventricle has no regional  wall motion abnormalities. There is mild left ventricular hypertrophy.  Left ventricular  diastolic parameters  are indeterminate.   2. Right ventricular systolic function is normal. The right ventricular  size is normal. There is mildly elevated pulmonary artery systolic  pressure.   3. Mild mitral valve regurgitation.   4. The aortic valve is tricuspid. Aortic valve regurgitation is not  visualized. Mild to moderate aortic valve sclerosis/calcification is  present, without any evidence of aortic stenosis.   5. The inferior vena cava is normal in size with <50% respiratory  variability, suggesting right atrial pressure of 8 mmHg.   Cardiac CT: Date: 03/07/2021 Results: IMPRESSION: 1. Coronary calcium score of 620. This was 89th percentile for age-, sex, and race-matched controls.   2. Normal coronary origin with left dominance.   3. Poor quality study with poor heart rate control/irregular rhythm during exam.   4. Non-diagnostic proximal to mid LAD due to stair step artifact. Heavily calcified artery and cannot exclude moderate to severe stenosis.   5. Heavily calcified OM1 with concern for moderate stenosis (50-69%).   6. Heavily calcified ostial lesion of the non-dominant RCA and cannot exclude severe stenosis (70-99%).   7. CT FFR not sent as motion prevents accurate assessment.   RECOMMENDATIONS: 1. Non-diagnostic study. Obstructive CAD can't be excluded. Would recommend left heart catheterization.  NM Stress Testing : Date:03/06/21 Results: There was no ST segment deviation noted during stress. Findings consistent with ischemia. This is an intermediate risk study. The left ventricular ejection fraction is hyperdynamic (>65%). Nuclear stress EF: 66%.   There is a medium size defect of mild severity present in the basal anterior, mid anterior, apical anterior and apex location. Grossly normal wall motion. LVEF 66%. Findings suggest ischemia in LAD distribution. Intermediate risk study due to TID 1.58 and medium size defect.    Recent Labs: 03/04/2021: B  Natriuretic Peptide 24.5; Magnesium 1.9 03/05/2021: TSH 1.698 03/07/2021: ALT 16; Hemoglobin 9.3; Platelets 228 03/09/2021: BUN 20; Creatinine, Ser 0.96; Potassium 3.9; Sodium 133  Recent Lipid Panel    Component Value Date/Time   CHOL 219 (H) 03/05/2021 0701   CHOL 186 06/01/2020 1456   TRIG 69 03/05/2021 0701   HDL 45 03/05/2021 0701   HDL 51 06/01/2020 1456   CHOLHDL 4.9 03/05/2021 0701   VLDL 14 03/05/2021 0701   LDLCALC 160 (H) 03/05/2021 0701   LDLCALC 118 (H) 06/01/2020 1456     Risk Assessment/Calculations:    CHA2DS2-VASc Score = 6  This indicates a 9.7% annual risk of stroke. The patient's score is based upon: CHF History: No HTN History: Yes Diabetes History: Yes Stroke History:  No Vascular Disease History: Yes Age Score: 2 Gender Score: 1          Physical Exam:    VS:  BP 137/67   Pulse 82   Ht '5\' 4"'$  (1.626 m)   Wt 200 lb 6.4 oz (90.9 kg)   SpO2 96%   BMI 34.40 kg/m     Wt Readings from Last 3 Encounters:  03/31/21 200 lb 6.4 oz (90.9 kg)  03/15/21 224 lb 14.4 oz (102 kg)  01/24/21 230 lb (104.3 kg)     GEN: Obese well developed in no acute distress HEENT: Normal NECK: No JVD LYMPHATICS: No lymphadenopathy CARDIAC: RRR, no murmurs, rubs, gallops RESPIRATORY:  Clear to auscultation without rales, wheezing or rhonchi  ABDOMEN: Soft, non-tender, non-distended MUSCULOSKELETAL:  +1 edema; No deformity  SKIN: Warm and dry L ankle wound has improved NEUROLOGIC:  Alert and oriented x 3 PSYCHIATRIC:  Normal affect   ASSESSMENT:    1. Atrial flutter with rapid ventricular response ([place])   2. Morbid obesity ([place])   3. History of pulmonary embolus (PE)   4. Primary hypertension   5. Dependent edema    PLAN:    NSTEMI Aortic Atherosclerosis and CAC PAF with CHADSVASC 6 HTN Morbid Obesity - Discussed LHC at multiple times and with family, will plan for conservative mgmt unless new sx occur; she has f/u 03/31/21 for outpatient eval reviewed again  with family  - Coreg 6.25 mg PO BID  - losartan 50 mg PO Daily - atorvastatin 80 and zetia 10 mg; lipids at next visit - PRN nitro - PRN lasix 20 mg PO    AFL Hx of PE - DOAC   Chronic venous insufficiency Hypoalbuminemia R Wound - lasix 20 mg PO PRN    Three months f/u     Medication Adjustments/Labs and Tests Ordered: Current medicines are reviewed at length with the patient today.  Concerns regarding medicines are outlined above.  No orders of the defined types were placed in this encounter.  No orders of the defined types were placed in this encounter.      Signed, Werner Lean, MD  03/31/2021 3:38 PM    [facility]

## 2021-04-02 DIAGNOSIS — I480 Paroxysmal atrial fibrillation: Secondary | ICD-10-CM | POA: Diagnosis not present

## 2021-04-02 DIAGNOSIS — I2510 Atherosclerotic heart disease of native coronary artery without angina pectoris: Secondary | ICD-10-CM | POA: Diagnosis not present

## 2021-04-02 DIAGNOSIS — E1140 Type 2 diabetes mellitus with diabetic neuropathy, unspecified: Secondary | ICD-10-CM | POA: Diagnosis not present

## 2021-04-02 DIAGNOSIS — M19072 Primary osteoarthritis, left ankle and foot: Secondary | ICD-10-CM | POA: Diagnosis not present

## 2021-04-02 DIAGNOSIS — E1151 Type 2 diabetes mellitus with diabetic peripheral angiopathy without gangrene: Secondary | ICD-10-CM | POA: Diagnosis not present

## 2021-04-02 DIAGNOSIS — I4892 Unspecified atrial flutter: Secondary | ICD-10-CM | POA: Diagnosis not present

## 2021-04-02 DIAGNOSIS — I10 Essential (primary) hypertension: Secondary | ICD-10-CM | POA: Diagnosis not present

## 2021-04-02 DIAGNOSIS — I872 Venous insufficiency (chronic) (peripheral): Secondary | ICD-10-CM | POA: Diagnosis not present

## 2021-04-02 DIAGNOSIS — M10072 Idiopathic gout, left ankle and foot: Secondary | ICD-10-CM | POA: Diagnosis not present

## 2021-04-05 DIAGNOSIS — I10 Essential (primary) hypertension: Secondary | ICD-10-CM | POA: Diagnosis not present

## 2021-04-05 DIAGNOSIS — M19072 Primary osteoarthritis, left ankle and foot: Secondary | ICD-10-CM | POA: Diagnosis not present

## 2021-04-05 DIAGNOSIS — E1140 Type 2 diabetes mellitus with diabetic neuropathy, unspecified: Secondary | ICD-10-CM | POA: Diagnosis not present

## 2021-04-05 DIAGNOSIS — I480 Paroxysmal atrial fibrillation: Secondary | ICD-10-CM | POA: Diagnosis not present

## 2021-04-05 DIAGNOSIS — M10072 Idiopathic gout, left ankle and foot: Secondary | ICD-10-CM | POA: Diagnosis not present

## 2021-04-05 DIAGNOSIS — I4892 Unspecified atrial flutter: Secondary | ICD-10-CM | POA: Diagnosis not present

## 2021-04-05 DIAGNOSIS — E1151 Type 2 diabetes mellitus with diabetic peripheral angiopathy without gangrene: Secondary | ICD-10-CM | POA: Diagnosis not present

## 2021-04-05 DIAGNOSIS — I2510 Atherosclerotic heart disease of native coronary artery without angina pectoris: Secondary | ICD-10-CM | POA: Diagnosis not present

## 2021-04-05 DIAGNOSIS — I872 Venous insufficiency (chronic) (peripheral): Secondary | ICD-10-CM | POA: Diagnosis not present

## 2021-04-07 DIAGNOSIS — E1151 Type 2 diabetes mellitus with diabetic peripheral angiopathy without gangrene: Secondary | ICD-10-CM | POA: Diagnosis not present

## 2021-04-07 DIAGNOSIS — I4892 Unspecified atrial flutter: Secondary | ICD-10-CM | POA: Diagnosis not present

## 2021-04-07 DIAGNOSIS — I872 Venous insufficiency (chronic) (peripheral): Secondary | ICD-10-CM | POA: Diagnosis not present

## 2021-04-07 DIAGNOSIS — I480 Paroxysmal atrial fibrillation: Secondary | ICD-10-CM | POA: Diagnosis not present

## 2021-04-07 DIAGNOSIS — I10 Essential (primary) hypertension: Secondary | ICD-10-CM | POA: Diagnosis not present

## 2021-04-07 DIAGNOSIS — M10072 Idiopathic gout, left ankle and foot: Secondary | ICD-10-CM | POA: Diagnosis not present

## 2021-04-07 DIAGNOSIS — M19072 Primary osteoarthritis, left ankle and foot: Secondary | ICD-10-CM | POA: Diagnosis not present

## 2021-04-07 DIAGNOSIS — I2510 Atherosclerotic heart disease of native coronary artery without angina pectoris: Secondary | ICD-10-CM | POA: Diagnosis not present

## 2021-04-07 DIAGNOSIS — E1140 Type 2 diabetes mellitus with diabetic neuropathy, unspecified: Secondary | ICD-10-CM | POA: Diagnosis not present

## 2021-04-08 DIAGNOSIS — I480 Paroxysmal atrial fibrillation: Secondary | ICD-10-CM | POA: Diagnosis not present

## 2021-04-08 DIAGNOSIS — E1140 Type 2 diabetes mellitus with diabetic neuropathy, unspecified: Secondary | ICD-10-CM | POA: Diagnosis not present

## 2021-04-08 DIAGNOSIS — I10 Essential (primary) hypertension: Secondary | ICD-10-CM | POA: Diagnosis not present

## 2021-04-08 DIAGNOSIS — M19072 Primary osteoarthritis, left ankle and foot: Secondary | ICD-10-CM | POA: Diagnosis not present

## 2021-04-08 DIAGNOSIS — I4892 Unspecified atrial flutter: Secondary | ICD-10-CM | POA: Diagnosis not present

## 2021-04-08 DIAGNOSIS — E1151 Type 2 diabetes mellitus with diabetic peripheral angiopathy without gangrene: Secondary | ICD-10-CM | POA: Diagnosis not present

## 2021-04-08 DIAGNOSIS — I872 Venous insufficiency (chronic) (peripheral): Secondary | ICD-10-CM | POA: Diagnosis not present

## 2021-04-08 DIAGNOSIS — I2510 Atherosclerotic heart disease of native coronary artery without angina pectoris: Secondary | ICD-10-CM | POA: Diagnosis not present

## 2021-04-08 DIAGNOSIS — M10072 Idiopathic gout, left ankle and foot: Secondary | ICD-10-CM | POA: Diagnosis not present

## 2021-04-11 ENCOUNTER — Inpatient Hospital Stay: Payer: Medicare PPO | Admitting: Family Medicine

## 2021-04-12 ENCOUNTER — Other Ambulatory Visit: Payer: Self-pay

## 2021-04-12 ENCOUNTER — Ambulatory Visit: Payer: Medicare PPO | Admitting: Podiatry

## 2021-04-12 DIAGNOSIS — E1142 Type 2 diabetes mellitus with diabetic polyneuropathy: Secondary | ICD-10-CM | POA: Diagnosis not present

## 2021-04-12 DIAGNOSIS — M79676 Pain in unspecified toe(s): Secondary | ICD-10-CM

## 2021-04-12 DIAGNOSIS — B351 Tinea unguium: Secondary | ICD-10-CM | POA: Diagnosis not present

## 2021-04-13 DIAGNOSIS — M10072 Idiopathic gout, left ankle and foot: Secondary | ICD-10-CM | POA: Diagnosis not present

## 2021-04-13 DIAGNOSIS — I2510 Atherosclerotic heart disease of native coronary artery without angina pectoris: Secondary | ICD-10-CM | POA: Diagnosis not present

## 2021-04-13 DIAGNOSIS — I4892 Unspecified atrial flutter: Secondary | ICD-10-CM | POA: Diagnosis not present

## 2021-04-13 DIAGNOSIS — I10 Essential (primary) hypertension: Secondary | ICD-10-CM | POA: Diagnosis not present

## 2021-04-13 DIAGNOSIS — M19072 Primary osteoarthritis, left ankle and foot: Secondary | ICD-10-CM | POA: Diagnosis not present

## 2021-04-13 DIAGNOSIS — I872 Venous insufficiency (chronic) (peripheral): Secondary | ICD-10-CM | POA: Diagnosis not present

## 2021-04-13 DIAGNOSIS — E1140 Type 2 diabetes mellitus with diabetic neuropathy, unspecified: Secondary | ICD-10-CM | POA: Diagnosis not present

## 2021-04-13 DIAGNOSIS — E1151 Type 2 diabetes mellitus with diabetic peripheral angiopathy without gangrene: Secondary | ICD-10-CM | POA: Diagnosis not present

## 2021-04-13 DIAGNOSIS — I480 Paroxysmal atrial fibrillation: Secondary | ICD-10-CM | POA: Diagnosis not present

## 2021-04-14 DIAGNOSIS — M19072 Primary osteoarthritis, left ankle and foot: Secondary | ICD-10-CM | POA: Diagnosis not present

## 2021-04-14 DIAGNOSIS — I2510 Atherosclerotic heart disease of native coronary artery without angina pectoris: Secondary | ICD-10-CM | POA: Diagnosis not present

## 2021-04-14 DIAGNOSIS — M10072 Idiopathic gout, left ankle and foot: Secondary | ICD-10-CM | POA: Diagnosis not present

## 2021-04-14 DIAGNOSIS — I4892 Unspecified atrial flutter: Secondary | ICD-10-CM | POA: Diagnosis not present

## 2021-04-14 DIAGNOSIS — I872 Venous insufficiency (chronic) (peripheral): Secondary | ICD-10-CM | POA: Diagnosis not present

## 2021-04-14 DIAGNOSIS — I10 Essential (primary) hypertension: Secondary | ICD-10-CM | POA: Diagnosis not present

## 2021-04-14 DIAGNOSIS — E1140 Type 2 diabetes mellitus with diabetic neuropathy, unspecified: Secondary | ICD-10-CM | POA: Diagnosis not present

## 2021-04-14 DIAGNOSIS — I480 Paroxysmal atrial fibrillation: Secondary | ICD-10-CM | POA: Diagnosis not present

## 2021-04-14 DIAGNOSIS — E1151 Type 2 diabetes mellitus with diabetic peripheral angiopathy without gangrene: Secondary | ICD-10-CM | POA: Diagnosis not present

## 2021-04-16 ENCOUNTER — Ambulatory Visit (INDEPENDENT_AMBULATORY_CARE_PROVIDER_SITE_OTHER): Payer: Medicare PPO

## 2021-04-16 ENCOUNTER — Other Ambulatory Visit: Payer: Self-pay

## 2021-04-16 DIAGNOSIS — Z0000 Encounter for general adult medical examination without abnormal findings: Secondary | ICD-10-CM

## 2021-04-16 NOTE — Progress Notes (Addendum)
Subjective:   Kayla [name] is a 80 y.o. female who presents for Medicare Annual (Subsequent) preventive examination. I connected with  Kayla [name] on 04/16/21 by a video enabled telemedicine application and verified that I am speaking with the correct person using two identifiers.   I discussed the limitations of evaluation and management by telemedicine. The patient expressed understanding and agreed to proceed.   Location of patient: Home Location of provider:  Home  Review of Systems    Defer to PCP       Objective:    There were no vitals filed for this visit. There is no height or weight on file to calculate BMI.  Advanced Directives 04/16/2021 03/12/2021 03/05/2021 02/20/2019 12/18/2018 11/25/2016 03/06/2016  Does Patient Have a Medical Advance Directive? Yes - No Yes Yes No Yes  Type of Paramedic of [place];Living will - - [place];Living will [place];Living will - [place];Living will  Does patient want to make changes to medical advance directive? No - Patient declined - - - No - Patient declined - No - Patient declined  Copy of Healthcare Power of Attorney in Chart? - - - No - copy requested No - copy requested - No - copy requested  Would patient like information on creating a medical advance directive? - No - Patient declined - - - - -    Current Medications (verified) Outpatient Encounter Medications as of 04/16/2021  Medication Sig   Accu-Chek Softclix Lancets lancets 1 each by Other route daily. Use as instructed   acetaminophen (TYLENOL) 650 MG CR tablet Take 650 mg by mouth every 8 (eight) hours as needed for pain.   ammonium lactate (LAC-HYDRIN) 12 % lotion Apply 1 application topically as needed for dry skin.   apixaban (ELIQUIS) 5 MG TABS tablet Take 1 tablet (5 mg total) by mouth 2 (two) times daily.   atorvastatin (LIPITOR) 80 MG tablet Take 1 tablet (80 mg total) by mouth  daily.   carvedilol (COREG) 6.25 MG tablet Take 1 tablet (6.25 mg total) by mouth 2 (two) times daily.   diclofenac Sodium (VOLTAREN) 1 % GEL Apply 2 g topically 4 (four) times daily. Rub into affected area of foot 2 to 4 times daily   ezetimibe (ZETIA) 10 MG tablet Take 1 tablet (10 mg total) by mouth daily.   furosemide (LASIX) 20 MG tablet Take 1 tablet (20 mg total) by mouth daily as needed (take as needed for swelling).   glucose blood test strip 1 each by Other route as needed for other. Use as instructed   losartan (COZAAR) 50 MG tablet Take 1 tablet (50 mg total) by mouth daily.   metFORMIN (GLUCOPHAGE) 500 MG tablet Take 1 tablet (500 mg total) by mouth 2 (two) times daily with a meal.   nitroGLYCERIN (NITROSTAT) 0.4 MG SL tablet Take up to 3 tablets   TRIAMCINOLONE ACETONIDE, TOP, 0.05 % OINT Apply 1 application topically 2 (two) times daily as needed (rough skin on heels).   No facility-administered encounter medications on file as of 04/16/2021.    Allergies (verified) Patient has no known allergies.   History: Past Medical History:  Diagnosis Date   Arthritis    Diabetes mellitus    Hypertension    Obesity    Pulmonary embolus ([place])    Past Surgical History:  Procedure Laterality Date   ABDOMINAL HYSTERECTOMY     CHOLECYSTECTOMY  JOINT REPLACEMENT  Right   Family History  Problem Relation Age of Onset   Hypertension Mother    Social History   Socioeconomic History   Marital status: Widowed    Spouse name: Not on file   Number of children: Not on file   Years of education: Not on file   Highest education level: Not on file  Occupational History   Not on file  Tobacco Use   Smoking status: Never   Smokeless tobacco: Never  Vaping Use   Vaping Use: Never used  Substance and Sexual Activity   Alcohol use: No   Drug use: No   Sexual activity: Not Currently  Other Topics Concern   Not on file  Social History Narrative   Not on file   Social  Determinants of Health   Financial Resource Strain: Low Risk    Difficulty of Paying Living Expenses: Not hard at all  Food Insecurity: No Food Insecurity   Worried About Running Out of Food in the Last Year: Never true   [place] in the Last Year: Never true  Transportation Needs: No Transportation Needs   Lack of Transportation (Medical): No   Lack of Transportation (Non-Medical): No  Physical Activity: Insufficiently Active   Days of Exercise per Week: 3 days   Minutes of Exercise per Session: 30 min  Stress: No Stress Concern Present   Feeling of Stress : Not at all  Social Connections: Unknown   Frequency of Communication with Friends and Family: More than three times a week   Frequency of Social Gatherings with Friends and Family: More than three times a week   Attends Religious Services: Not on file   Active Member of Clubs or Organizations: No   Attends Archivist Meetings: Never   Marital Status: Widowed    Tobacco Counseling Counseling given: Not Answered   Clinical Intake:  Pre-visit preparation completed: Yes  Pain : No/denies pain     Diabetes: Yes CBG done?: No Did pt. bring in CBG monitor from home?: No  How often do you need to have someone help you when you read instructions, pamphlets, or other written materials from your doctor or pharmacy?: 1 - Never  Diabetic? Yes  Interpreter Needed?: No      Activities of Daily Living In your present state of health, do you have any difficulty performing the following activities: 03/05/2021  Hearing? Y  Comment left hearing aid at home  Vision? N  Difficulty concentrating or making decisions? N  Walking or climbing stairs? Y  Dressing or bathing? Y  Doing errands, shopping? Y  Comment can help from sons or church member  Some recent data might be hidden    Patient Care Team: Denita Lung, MD as PCP - General (Family Medicine)  Indicate any recent Medical Services you may have  received from other than [facility] providers in the past year (date may be approximate).     Assessment:   This is a routine wellness examination for [name].  Hearing/Vision screen No results found.  Dietary issues and exercise activities discussed:     Goals Addressed   None   Depression Screen PHQ 2/9 Scores 04/16/2021 01/20/2020 12/18/2018 01/28/2018 03/06/2016 02/15/2015 04/01/2014  PHQ - 2 Score 0 0 1 0 0 0 0    Fall Risk Fall Risk  04/16/2021 04/21/2020 12/18/2018 01/28/2018 03/06/2016  Falls in the past year? 0 1 1 No No  Number falls in past yr: -  0 0 - -  Injury with Fall? - 0 0 - -  Risk Factor Category  - - - - -  Risk for fall due to : Impaired mobility - - - -  Follow up - - - - -    FALL RISK PREVENTION PERTAINING TO THE HOME:  Any stairs in or around the home? No  If so, are there any without handrails?  No stairs in home Home free of loose throw rugs in walkways, pet beds, electrical cords, etc? Yes  Adequate lighting in your home to reduce risk of falls? Yes   ASSISTIVE DEVICES UTILIZED TO PREVENT FALLS:  Life alert? Yes  Use of a cane, walker or w/c? Yes  Grab bars in the bathroom? Yes  Shower chair or bench in shower? Yes  Elevated toilet seat or a handicapped toilet?  Patient has DME to sit over toilet to raise height  TIMED UP AND GO:  Was the test performed?  N/A .  Length of time to ambulate 10 feet: N/A sec.     Cognitive Function:        Immunizations Immunization History  Administered Date(s) Administered   Fluad Quad(high Dose 65+) 06/01/2020   Influenza Split 07/27/2011   Influenza, High Dose Seasonal PF 05/08/2013, 08/30/2015, 07/11/2016, 04/16/2018   PFIZER Comirnaty(Gray Top)Covid-19 Tri-Sucrose Vaccine 09/10/2020, 03/15/2021   PFIZER(Purple Top)SARS-COV-2 Vaccination 07/28/2020   Pneumococcal Conjugate-13 01/04/2015   Pneumococcal Polysaccharide-23 07/11/2016   Td 02/12/1995, 04/26/2006    TDAP status: Due, Education has been provided  regarding the importance of this vaccine. Advised may receive this vaccine at local pharmacy or Health Dept. Aware to provide a copy of the vaccination record if obtained from local pharmacy or Health Dept. Verbalized acceptance and understanding.  Flu Vaccine status: Due, Education has been provided regarding the importance of this vaccine. Advised may receive this vaccine at local pharmacy or Health Dept. Aware to provide a copy of the vaccination record if obtained from local pharmacy or Health Dept. Verbalized acceptance and understanding.  Pneumococcal vaccine status: Due, Education has been provided regarding the importance of this vaccine. Advised may receive this vaccine at local pharmacy or Health Dept. Aware to provide a copy of the vaccination record if obtained from local pharmacy or Health Dept. Verbalized acceptance and understanding.  Covid-19 vaccine status: Completed vaccines  Qualifies for Shingles Vaccine? Yes   Zostavax completed No   Shingrix Completed?: No.    Education has been provided regarding the importance of this vaccine. Patient has been advised to call insurance company to determine out of pocket expense if they have not yet received this vaccine. Advised may also receive vaccine at local pharmacy or Health Dept. Verbalized acceptance and understanding.  Screening Tests Health Maintenance  Topic Date Due   URINE MICROALBUMIN  Never done   Zoster Vaccines- Shingrix (1 of 2) Never done   TETANUS/TDAP  04/26/2016   FOOT EXAM  02/05/2021   INFLUENZA VACCINE  02/28/2021   OPHTHALMOLOGY EXAM  06/18/2021   COVID-19 Vaccine (4 - Booster for Pfizer series) 07/15/2021   HEMOGLOBIN A1C  09/05/2021   DEXA SCAN  Completed   HPV VACCINES  Aged Out    Health Maintenance  Health Maintenance Due  Topic Date Due   URINE MICROALBUMIN  Never done   Zoster Vaccines- Shingrix (1 of 2) Never done   TETANUS/TDAP  04/26/2016   FOOT EXAM  02/05/2021   INFLUENZA VACCINE   02/28/2021    Colorectal cancer  screening: Type of screening: Colonoscopy. Completed 06/07/2013. Repeat every 2 years  Mammogram status: No longer required due to Age.  Bone Density status: Completed 03/13/2006. Results reflect: Bone density results: OSTEOPOROSIS. Repeat every 2 years.  Lung Cancer Screening: (Low Dose CT Chest recommended if Age 87-80 years, 30 pack-year currently smoking OR have quit w/in 15years.) does not qualify.   Lung Cancer Screening Referral: N/A  Additional Screening:  Hepatitis C Screening: does not qualify; Completed n/a  Vision Screening: Recommended annual ophthalmology exams for early detection of glaucoma and other disorders of the eye. Is the patient up to date with their annual eye exam?  No  Who is the provider or what is the name of the office in which the patient attends annual eye exams? [facility].  If pt is not established with a provider, would they like to be referred to a provider to establish care?  Upcoming appointment for October 11 .   Dental Screening: Recommended annual dental exams for proper oral hygiene  Community Resource Referral / Chronic Care Management: CRR required this visit?  No   CCM required this visit?  No      Plan:     I have personally reviewed and noted the following in the patient's chart:   Medical and social history Use of alcohol, tobacco or illicit drugs  Current medications and supplements including opioid prescriptions.  Functional ability and status Nutritional status Physical activity Advanced directives List of other physicians Hospitalizations, surgeries, and ER visits in previous 12 months Vitals Screenings to include cognitive, depression, and falls Referrals and appointments  In addition, I have reviewed and discussed with patient certain preventive protocols, quality metrics, and best practice recommendations. A written personalized care plan for preventive services as well  as general preventive health recommendations were provided to patient.     Nat Christen, CMA   04/16/2021   Nurse Notes: Non face to face 50 minute visit.  Ms. Hebel , Thank you for taking time to come for your Medicare Wellness Visit. I appreciate your ongoing commitment to your health goals. Please review the following plan we discussed and let me know if I can assist you in the future.   These are the goals we discussed:  Goals   None     This is a list of the screening recommended for you and due dates:  Health Maintenance  Topic Date Due   Urine Protein Check  Never done   Zoster (Shingles) Vaccine (1 of 2) Never done   Tetanus Vaccine  04/26/2016   Complete foot exam   02/05/2021   Flu Shot  02/28/2021   Eye exam for diabetics  06/18/2021   COVID-19 Vaccine (4 - Booster for Pfizer series) 07/15/2021   Hemoglobin A1C  09/05/2021   DEXA scan (bone density measurement)  Completed   HPV Vaccine  Aged Out     .

## 2021-04-16 NOTE — Patient Instructions (Signed)
Health Maintenance, Female Adopting a healthy lifestyle and getting preventive care are important in promoting health and wellness. Ask your health care provider about: The right schedule for you to have regular tests and exams. Things you can do on your own to prevent diseases and keep yourself healthy. What should I know about diet, weight, and exercise? Eat a healthy diet  Eat a diet that includes plenty of vegetables, fruits, low-fat dairy products, and lean protein. Do not eat a lot of foods that are high in solid fats, added sugars, or sodium. Maintain a healthy weight Body mass index (BMI) is used to identify weight problems. It estimates body fat based on height and weight. Your health care provider can help determine your BMI and help you achieve or maintain a healthy weight. Get regular exercise Get regular exercise. This is one of the most important things you can do for your health. Most adults should: Exercise for at least 150 minutes each week. The exercise should increase your heart rate and make you sweat (moderate-intensity exercise). Do strengthening exercises at least twice a week. This is in addition to the moderate-intensity exercise. Spend less time sitting. Even light physical activity can be beneficial. Watch cholesterol and blood lipids Have your blood tested for lipids and cholesterol at 82 years of age, then have this test every 5 years. Have your cholesterol levels checked more often if: Your lipid or cholesterol levels are high. You are older than 82 years of age. You are at high risk for heart disease. What should I know about cancer screening? Depending on your health history and family history, you may need to have cancer screening at various ages. This may include screening for: Breast cancer. Cervical cancer. Colorectal cancer. Skin cancer. Lung cancer. What should I know about heart disease, diabetes, and high blood pressure? Blood pressure and heart  disease High blood pressure causes heart disease and increases the risk of stroke. This is more likely to develop in people who have high blood pressure readings, are of African descent, or are overweight. Have your blood pressure checked: Every 3-5 years if you are 18-39 years of age. Every year if you are 40 years old or older. Diabetes Have regular diabetes screenings. This checks your fasting blood sugar level. Have the screening done: Once every three years after age 40 if you are at a normal weight and have a low risk for diabetes. More often and at a younger age if you are overweight or have a high risk for diabetes. What should I know about preventing infection? Hepatitis B If you have a higher risk for hepatitis B, you should be screened for this virus. Talk with your health care provider to find out if you are at risk for hepatitis B infection. Hepatitis C Testing is recommended for: Everyone born from 1945 through 1965. Anyone with known risk factors for hepatitis C. Sexually transmitted infections (STIs) Get screened for STIs, including gonorrhea and chlamydia, if: You are sexually active and are younger than 82 years of age. You are older than 82 years of age and your health care provider tells you that you are at risk for this type of infection. Your sexual activity has changed since you were last screened, and you are at increased risk for chlamydia or gonorrhea. Ask your health care provider if you are at risk. Ask your health care provider about whether you are at high risk for HIV. Your health care provider may recommend a prescription medicine   to help prevent HIV infection. If you choose to take medicine to prevent HIV, you should first get tested for HIV. You should then be tested every 3 months for as long as you are taking the medicine. Pregnancy If you are about to stop having your period (premenopausal) and you may become pregnant, seek counseling before you get  pregnant. Take 400 to 800 micrograms (mcg) of folic acid every day if you become pregnant. Ask for birth control (contraception) if you want to prevent pregnancy. Osteoporosis and menopause Osteoporosis is a disease in which the bones lose minerals and strength with aging. This can result in bone fractures. If you are 65 years old or older, or if you are at risk for osteoporosis and fractures, ask your health care provider if you should: Be screened for bone loss. Take a calcium or vitamin D supplement to lower your risk of fractures. Be given hormone replacement therapy (HRT) to treat symptoms of menopause. Follow these instructions at home: Lifestyle Do not use any products that contain nicotine or tobacco, such as cigarettes, e-cigarettes, and chewing tobacco. If you need help quitting, ask your health care provider. Do not use street drugs. Do not share needles. Ask your health care provider for help if you need support or information about quitting drugs. Alcohol use Do not drink alcohol if: Your health care provider tells you not to drink. You are pregnant, may be pregnant, or are planning to become pregnant. If you drink alcohol: Limit how much you use to 0-1 drink a day. Limit intake if you are breastfeeding. Be aware of how much alcohol is in your drink. In the [place] one drink equals one 12 oz bottle of beer (355 mL), one 5 oz glass of wine (148 mL), or one 1 oz glass of hard liquor (44 mL). General instructions Schedule regular health, dental, and eye exams. Stay current with your vaccines. Tell your health care provider if: You often feel depressed. You have ever been abused or do not feel safe at home. Summary Adopting a healthy lifestyle and getting preventive care are important in promoting health and wellness. Follow your health care provider's instructions about healthy diet, exercising, and getting tested or screened for diseases. Follow your health care provider's  instructions on monitoring your cholesterol and blood pressure. This information is not intended to replace advice given to you by your health care provider. Make sure you discuss any questions you have with your health care provider. Document Revised: 09/24/2020 Document Reviewed: 07/10/2018 Elsevier Patient Education  2022 Elsevier Inc.  

## 2021-04-17 ENCOUNTER — Encounter: Payer: Self-pay | Admitting: Podiatry

## 2021-04-17 NOTE — Progress Notes (Signed)
  Subjective:  Patient ID: Kayla [name], female    DOB: 13-May-1941,  MRN: [id number]  Kayla [name] presents to clinic today for at risk foot care with history of diabetic neuropathy and painful thick toenails that are difficult to trim. Pain interferes with ambulation. Aggravating factors include wearing enclosed shoe gear. Pain is relieved with periodic professional debridement.  Patient states blood glucose was 110 mg/dl today. Last A1c was 6.7%.  PCP is Denita Lung, MD , and last visit was 01/10/2021.  Patient states she is using moisturizer on heels daily. She also informs me her venous status ulceration of the LLE has healed. She has a lymphedema pump at home and uses it as instructed by Vascular Team. She voices no new pedal concerns on today's visit.  No Known Allergies  Review of Systems: Negative except as noted in the HPI. Objective:   Constitutional Kayla [name] is a pleasant 80 y.o. African American female, obese in NAD. AAO x 3.   Vascular Capillary fill time to digits <3 seconds b/l lower extremities. Faintly palpable pedal pulses b/l. Pedal hair absent. Lower extremity skin temperature gradient within normal limits. No pain with calf compression b/l. Lymphedema present b/l lower extremities. No cyanosis or clubbing noted.  Neurologic Normal speech. Oriented to person, place, and time. Pt has subjective symptoms of neuropathy. Protective sensation intact 5/5 intact bilaterally with 10g monofilament b/l. Vibratory sensation intact b/l.  Dermatologic No open wounds b/l lower extremities. No interdigital macerations b/l lower extremities. Toenails 1-5 b/l elongated, discolored, dystrophic, thickened, crumbly with subungual debris and tenderness to dorsal palpation. Skin b/l lower extremities noted to be thickened and brawny consistent with lymphedema.  Orthopedic: Normal muscle strength 5/5 to all lower extremity muscle groups bilaterally. Pes planus deformity noted b/l  lower extremities. Utilizes wheelchair for mobility assistance.   Radiographs: None Assessment:   1. Pain due to onychomycosis of toenail   2. Diabetic peripheral neuropathy associated with type 2 diabetes mellitus ([place])    Plan:  Patient was evaluated and treated and all questions answered. Consent given for treatment as described below: -Continue diabetic foot care principles: inspect feet daily, monitor glucose as recommended by PCP and/or Endocrinologist, and follow prescribed diet per PCP, Endocrinologist and/or dietician. -Patient to continue soft, supportive shoe gear daily. -Toenails 1-5 b/l were debrided in length and girth with sterile nail nippers and dremel without iatrogenic bleeding.  -Patient to report any pedal injuries to medical professional immediately. -Patient/POA to call should there be question/concern in the interim.  Return in about 3 months (around 07/12/2021).  Marzetta Board, DPM

## 2021-04-18 DIAGNOSIS — M10072 Idiopathic gout, left ankle and foot: Secondary | ICD-10-CM | POA: Diagnosis not present

## 2021-04-18 DIAGNOSIS — I4892 Unspecified atrial flutter: Secondary | ICD-10-CM | POA: Diagnosis not present

## 2021-04-18 DIAGNOSIS — E1140 Type 2 diabetes mellitus with diabetic neuropathy, unspecified: Secondary | ICD-10-CM | POA: Diagnosis not present

## 2021-04-18 DIAGNOSIS — I480 Paroxysmal atrial fibrillation: Secondary | ICD-10-CM | POA: Diagnosis not present

## 2021-04-18 DIAGNOSIS — E1151 Type 2 diabetes mellitus with diabetic peripheral angiopathy without gangrene: Secondary | ICD-10-CM | POA: Diagnosis not present

## 2021-04-18 DIAGNOSIS — I872 Venous insufficiency (chronic) (peripheral): Secondary | ICD-10-CM | POA: Diagnosis not present

## 2021-04-18 DIAGNOSIS — M19072 Primary osteoarthritis, left ankle and foot: Secondary | ICD-10-CM | POA: Diagnosis not present

## 2021-04-18 DIAGNOSIS — I10 Essential (primary) hypertension: Secondary | ICD-10-CM | POA: Diagnosis not present

## 2021-04-18 DIAGNOSIS — I2510 Atherosclerotic heart disease of native coronary artery without angina pectoris: Secondary | ICD-10-CM | POA: Diagnosis not present

## 2021-04-20 DIAGNOSIS — M19072 Primary osteoarthritis, left ankle and foot: Secondary | ICD-10-CM | POA: Diagnosis not present

## 2021-04-20 DIAGNOSIS — I872 Venous insufficiency (chronic) (peripheral): Secondary | ICD-10-CM | POA: Diagnosis not present

## 2021-04-20 DIAGNOSIS — E1151 Type 2 diabetes mellitus with diabetic peripheral angiopathy without gangrene: Secondary | ICD-10-CM | POA: Diagnosis not present

## 2021-04-20 DIAGNOSIS — I480 Paroxysmal atrial fibrillation: Secondary | ICD-10-CM | POA: Diagnosis not present

## 2021-04-20 DIAGNOSIS — E1140 Type 2 diabetes mellitus with diabetic neuropathy, unspecified: Secondary | ICD-10-CM | POA: Diagnosis not present

## 2021-04-20 DIAGNOSIS — M10072 Idiopathic gout, left ankle and foot: Secondary | ICD-10-CM | POA: Diagnosis not present

## 2021-04-20 DIAGNOSIS — I10 Essential (primary) hypertension: Secondary | ICD-10-CM | POA: Diagnosis not present

## 2021-04-20 DIAGNOSIS — I4892 Unspecified atrial flutter: Secondary | ICD-10-CM | POA: Diagnosis not present

## 2021-04-20 DIAGNOSIS — I2510 Atherosclerotic heart disease of native coronary artery without angina pectoris: Secondary | ICD-10-CM | POA: Diagnosis not present

## 2021-04-22 DIAGNOSIS — E1140 Type 2 diabetes mellitus with diabetic neuropathy, unspecified: Secondary | ICD-10-CM | POA: Diagnosis not present

## 2021-04-22 DIAGNOSIS — E1151 Type 2 diabetes mellitus with diabetic peripheral angiopathy without gangrene: Secondary | ICD-10-CM | POA: Diagnosis not present

## 2021-04-22 DIAGNOSIS — I4892 Unspecified atrial flutter: Secondary | ICD-10-CM | POA: Diagnosis not present

## 2021-04-22 DIAGNOSIS — I2510 Atherosclerotic heart disease of native coronary artery without angina pectoris: Secondary | ICD-10-CM | POA: Diagnosis not present

## 2021-04-22 DIAGNOSIS — I480 Paroxysmal atrial fibrillation: Secondary | ICD-10-CM | POA: Diagnosis not present

## 2021-04-22 DIAGNOSIS — I10 Essential (primary) hypertension: Secondary | ICD-10-CM | POA: Diagnosis not present

## 2021-04-22 DIAGNOSIS — M10072 Idiopathic gout, left ankle and foot: Secondary | ICD-10-CM | POA: Diagnosis not present

## 2021-04-22 DIAGNOSIS — I872 Venous insufficiency (chronic) (peripheral): Secondary | ICD-10-CM | POA: Diagnosis not present

## 2021-04-22 DIAGNOSIS — M19072 Primary osteoarthritis, left ankle and foot: Secondary | ICD-10-CM | POA: Diagnosis not present

## 2021-04-25 DIAGNOSIS — I480 Paroxysmal atrial fibrillation: Secondary | ICD-10-CM | POA: Diagnosis not present

## 2021-04-25 DIAGNOSIS — M19072 Primary osteoarthritis, left ankle and foot: Secondary | ICD-10-CM | POA: Diagnosis not present

## 2021-04-25 DIAGNOSIS — E1140 Type 2 diabetes mellitus with diabetic neuropathy, unspecified: Secondary | ICD-10-CM | POA: Diagnosis not present

## 2021-04-25 DIAGNOSIS — E1151 Type 2 diabetes mellitus with diabetic peripheral angiopathy without gangrene: Secondary | ICD-10-CM | POA: Diagnosis not present

## 2021-04-25 DIAGNOSIS — I4892 Unspecified atrial flutter: Secondary | ICD-10-CM | POA: Diagnosis not present

## 2021-04-25 DIAGNOSIS — M10072 Idiopathic gout, left ankle and foot: Secondary | ICD-10-CM | POA: Diagnosis not present

## 2021-04-25 DIAGNOSIS — I10 Essential (primary) hypertension: Secondary | ICD-10-CM | POA: Diagnosis not present

## 2021-04-25 DIAGNOSIS — I2510 Atherosclerotic heart disease of native coronary artery without angina pectoris: Secondary | ICD-10-CM | POA: Diagnosis not present

## 2021-04-25 DIAGNOSIS — I872 Venous insufficiency (chronic) (peripheral): Secondary | ICD-10-CM | POA: Diagnosis not present

## 2021-04-26 DIAGNOSIS — E1140 Type 2 diabetes mellitus with diabetic neuropathy, unspecified: Secondary | ICD-10-CM | POA: Diagnosis not present

## 2021-04-26 DIAGNOSIS — I4892 Unspecified atrial flutter: Secondary | ICD-10-CM | POA: Diagnosis not present

## 2021-04-26 DIAGNOSIS — I10 Essential (primary) hypertension: Secondary | ICD-10-CM | POA: Diagnosis not present

## 2021-04-26 DIAGNOSIS — M19072 Primary osteoarthritis, left ankle and foot: Secondary | ICD-10-CM | POA: Diagnosis not present

## 2021-04-26 DIAGNOSIS — I872 Venous insufficiency (chronic) (peripheral): Secondary | ICD-10-CM | POA: Diagnosis not present

## 2021-04-26 DIAGNOSIS — E1151 Type 2 diabetes mellitus with diabetic peripheral angiopathy without gangrene: Secondary | ICD-10-CM | POA: Diagnosis not present

## 2021-04-26 DIAGNOSIS — M10072 Idiopathic gout, left ankle and foot: Secondary | ICD-10-CM | POA: Diagnosis not present

## 2021-04-26 DIAGNOSIS — I480 Paroxysmal atrial fibrillation: Secondary | ICD-10-CM | POA: Diagnosis not present

## 2021-04-26 DIAGNOSIS — I2510 Atherosclerotic heart disease of native coronary artery without angina pectoris: Secondary | ICD-10-CM | POA: Diagnosis not present

## 2021-04-29 DIAGNOSIS — I480 Paroxysmal atrial fibrillation: Secondary | ICD-10-CM | POA: Diagnosis not present

## 2021-04-29 DIAGNOSIS — I4892 Unspecified atrial flutter: Secondary | ICD-10-CM | POA: Diagnosis not present

## 2021-04-29 DIAGNOSIS — M10072 Idiopathic gout, left ankle and foot: Secondary | ICD-10-CM | POA: Diagnosis not present

## 2021-04-29 DIAGNOSIS — M19072 Primary osteoarthritis, left ankle and foot: Secondary | ICD-10-CM | POA: Diagnosis not present

## 2021-04-29 DIAGNOSIS — I10 Essential (primary) hypertension: Secondary | ICD-10-CM | POA: Diagnosis not present

## 2021-04-29 DIAGNOSIS — E1151 Type 2 diabetes mellitus with diabetic peripheral angiopathy without gangrene: Secondary | ICD-10-CM | POA: Diagnosis not present

## 2021-04-29 DIAGNOSIS — E1140 Type 2 diabetes mellitus with diabetic neuropathy, unspecified: Secondary | ICD-10-CM | POA: Diagnosis not present

## 2021-04-29 DIAGNOSIS — I2510 Atherosclerotic heart disease of native coronary artery without angina pectoris: Secondary | ICD-10-CM | POA: Diagnosis not present

## 2021-04-29 DIAGNOSIS — I872 Venous insufficiency (chronic) (peripheral): Secondary | ICD-10-CM | POA: Diagnosis not present

## 2021-05-03 DIAGNOSIS — I10 Essential (primary) hypertension: Secondary | ICD-10-CM | POA: Diagnosis not present

## 2021-05-03 DIAGNOSIS — I872 Venous insufficiency (chronic) (peripheral): Secondary | ICD-10-CM | POA: Diagnosis not present

## 2021-05-03 DIAGNOSIS — E1140 Type 2 diabetes mellitus with diabetic neuropathy, unspecified: Secondary | ICD-10-CM | POA: Diagnosis not present

## 2021-05-03 DIAGNOSIS — I2510 Atherosclerotic heart disease of native coronary artery without angina pectoris: Secondary | ICD-10-CM | POA: Diagnosis not present

## 2021-05-03 DIAGNOSIS — M19072 Primary osteoarthritis, left ankle and foot: Secondary | ICD-10-CM | POA: Diagnosis not present

## 2021-05-03 DIAGNOSIS — E1151 Type 2 diabetes mellitus with diabetic peripheral angiopathy without gangrene: Secondary | ICD-10-CM | POA: Diagnosis not present

## 2021-05-03 DIAGNOSIS — M10072 Idiopathic gout, left ankle and foot: Secondary | ICD-10-CM | POA: Diagnosis not present

## 2021-05-03 DIAGNOSIS — I4892 Unspecified atrial flutter: Secondary | ICD-10-CM | POA: Diagnosis not present

## 2021-05-03 DIAGNOSIS — I480 Paroxysmal atrial fibrillation: Secondary | ICD-10-CM | POA: Diagnosis not present

## 2021-05-05 DIAGNOSIS — I2510 Atherosclerotic heart disease of native coronary artery without angina pectoris: Secondary | ICD-10-CM | POA: Diagnosis not present

## 2021-05-05 DIAGNOSIS — M19072 Primary osteoarthritis, left ankle and foot: Secondary | ICD-10-CM | POA: Diagnosis not present

## 2021-05-05 DIAGNOSIS — I480 Paroxysmal atrial fibrillation: Secondary | ICD-10-CM | POA: Diagnosis not present

## 2021-05-05 DIAGNOSIS — E1151 Type 2 diabetes mellitus with diabetic peripheral angiopathy without gangrene: Secondary | ICD-10-CM | POA: Diagnosis not present

## 2021-05-05 DIAGNOSIS — M10072 Idiopathic gout, left ankle and foot: Secondary | ICD-10-CM | POA: Diagnosis not present

## 2021-05-05 DIAGNOSIS — I872 Venous insufficiency (chronic) (peripheral): Secondary | ICD-10-CM | POA: Diagnosis not present

## 2021-05-05 DIAGNOSIS — I4892 Unspecified atrial flutter: Secondary | ICD-10-CM | POA: Diagnosis not present

## 2021-05-05 DIAGNOSIS — I10 Essential (primary) hypertension: Secondary | ICD-10-CM | POA: Diagnosis not present

## 2021-05-05 DIAGNOSIS — E1140 Type 2 diabetes mellitus with diabetic neuropathy, unspecified: Secondary | ICD-10-CM | POA: Diagnosis not present

## 2021-05-06 DIAGNOSIS — E1140 Type 2 diabetes mellitus with diabetic neuropathy, unspecified: Secondary | ICD-10-CM | POA: Diagnosis not present

## 2021-05-06 DIAGNOSIS — I2510 Atherosclerotic heart disease of native coronary artery without angina pectoris: Secondary | ICD-10-CM | POA: Diagnosis not present

## 2021-05-06 DIAGNOSIS — M19072 Primary osteoarthritis, left ankle and foot: Secondary | ICD-10-CM | POA: Diagnosis not present

## 2021-05-06 DIAGNOSIS — M10072 Idiopathic gout, left ankle and foot: Secondary | ICD-10-CM | POA: Diagnosis not present

## 2021-05-06 DIAGNOSIS — I480 Paroxysmal atrial fibrillation: Secondary | ICD-10-CM | POA: Diagnosis not present

## 2021-05-06 DIAGNOSIS — I872 Venous insufficiency (chronic) (peripheral): Secondary | ICD-10-CM | POA: Diagnosis not present

## 2021-05-06 DIAGNOSIS — I10 Essential (primary) hypertension: Secondary | ICD-10-CM | POA: Diagnosis not present

## 2021-05-06 DIAGNOSIS — I4892 Unspecified atrial flutter: Secondary | ICD-10-CM | POA: Diagnosis not present

## 2021-05-06 DIAGNOSIS — E1151 Type 2 diabetes mellitus with diabetic peripheral angiopathy without gangrene: Secondary | ICD-10-CM | POA: Diagnosis not present

## 2021-05-09 DIAGNOSIS — I480 Paroxysmal atrial fibrillation: Secondary | ICD-10-CM | POA: Diagnosis not present

## 2021-05-09 DIAGNOSIS — I10 Essential (primary) hypertension: Secondary | ICD-10-CM | POA: Diagnosis not present

## 2021-05-09 DIAGNOSIS — M10072 Idiopathic gout, left ankle and foot: Secondary | ICD-10-CM | POA: Diagnosis not present

## 2021-05-09 DIAGNOSIS — I2510 Atherosclerotic heart disease of native coronary artery without angina pectoris: Secondary | ICD-10-CM | POA: Diagnosis not present

## 2021-05-09 DIAGNOSIS — I4892 Unspecified atrial flutter: Secondary | ICD-10-CM | POA: Diagnosis not present

## 2021-05-09 DIAGNOSIS — M19072 Primary osteoarthritis, left ankle and foot: Secondary | ICD-10-CM | POA: Diagnosis not present

## 2021-05-09 DIAGNOSIS — E1151 Type 2 diabetes mellitus with diabetic peripheral angiopathy without gangrene: Secondary | ICD-10-CM | POA: Diagnosis not present

## 2021-05-09 DIAGNOSIS — I872 Venous insufficiency (chronic) (peripheral): Secondary | ICD-10-CM | POA: Diagnosis not present

## 2021-05-09 DIAGNOSIS — E1140 Type 2 diabetes mellitus with diabetic neuropathy, unspecified: Secondary | ICD-10-CM | POA: Diagnosis not present

## 2021-05-13 DIAGNOSIS — M10072 Idiopathic gout, left ankle and foot: Secondary | ICD-10-CM | POA: Diagnosis not present

## 2021-05-13 DIAGNOSIS — I872 Venous insufficiency (chronic) (peripheral): Secondary | ICD-10-CM | POA: Diagnosis not present

## 2021-05-13 DIAGNOSIS — E1140 Type 2 diabetes mellitus with diabetic neuropathy, unspecified: Secondary | ICD-10-CM | POA: Diagnosis not present

## 2021-05-13 DIAGNOSIS — I10 Essential (primary) hypertension: Secondary | ICD-10-CM | POA: Diagnosis not present

## 2021-05-13 DIAGNOSIS — I2510 Atherosclerotic heart disease of native coronary artery without angina pectoris: Secondary | ICD-10-CM | POA: Diagnosis not present

## 2021-05-13 DIAGNOSIS — E1151 Type 2 diabetes mellitus with diabetic peripheral angiopathy without gangrene: Secondary | ICD-10-CM | POA: Diagnosis not present

## 2021-05-13 DIAGNOSIS — M19072 Primary osteoarthritis, left ankle and foot: Secondary | ICD-10-CM | POA: Diagnosis not present

## 2021-05-13 DIAGNOSIS — I4892 Unspecified atrial flutter: Secondary | ICD-10-CM | POA: Diagnosis not present

## 2021-05-13 DIAGNOSIS — I480 Paroxysmal atrial fibrillation: Secondary | ICD-10-CM | POA: Diagnosis not present

## 2021-05-16 DIAGNOSIS — E1140 Type 2 diabetes mellitus with diabetic neuropathy, unspecified: Secondary | ICD-10-CM | POA: Diagnosis not present

## 2021-05-16 DIAGNOSIS — I872 Venous insufficiency (chronic) (peripheral): Secondary | ICD-10-CM | POA: Diagnosis not present

## 2021-05-16 DIAGNOSIS — I2510 Atherosclerotic heart disease of native coronary artery without angina pectoris: Secondary | ICD-10-CM | POA: Diagnosis not present

## 2021-05-16 DIAGNOSIS — I4892 Unspecified atrial flutter: Secondary | ICD-10-CM | POA: Diagnosis not present

## 2021-05-16 DIAGNOSIS — M10072 Idiopathic gout, left ankle and foot: Secondary | ICD-10-CM | POA: Diagnosis not present

## 2021-05-16 DIAGNOSIS — I10 Essential (primary) hypertension: Secondary | ICD-10-CM | POA: Diagnosis not present

## 2021-05-16 DIAGNOSIS — M19072 Primary osteoarthritis, left ankle and foot: Secondary | ICD-10-CM | POA: Diagnosis not present

## 2021-05-16 DIAGNOSIS — I480 Paroxysmal atrial fibrillation: Secondary | ICD-10-CM | POA: Diagnosis not present

## 2021-05-16 DIAGNOSIS — E1151 Type 2 diabetes mellitus with diabetic peripheral angiopathy without gangrene: Secondary | ICD-10-CM | POA: Diagnosis not present

## 2021-05-20 DIAGNOSIS — I10 Essential (primary) hypertension: Secondary | ICD-10-CM | POA: Diagnosis not present

## 2021-05-20 DIAGNOSIS — E1151 Type 2 diabetes mellitus with diabetic peripheral angiopathy without gangrene: Secondary | ICD-10-CM | POA: Diagnosis not present

## 2021-05-20 DIAGNOSIS — I872 Venous insufficiency (chronic) (peripheral): Secondary | ICD-10-CM | POA: Diagnosis not present

## 2021-05-20 DIAGNOSIS — E1140 Type 2 diabetes mellitus with diabetic neuropathy, unspecified: Secondary | ICD-10-CM | POA: Diagnosis not present

## 2021-05-20 DIAGNOSIS — I2510 Atherosclerotic heart disease of native coronary artery without angina pectoris: Secondary | ICD-10-CM | POA: Diagnosis not present

## 2021-05-20 DIAGNOSIS — I480 Paroxysmal atrial fibrillation: Secondary | ICD-10-CM | POA: Diagnosis not present

## 2021-05-20 DIAGNOSIS — M10072 Idiopathic gout, left ankle and foot: Secondary | ICD-10-CM | POA: Diagnosis not present

## 2021-05-20 DIAGNOSIS — I4892 Unspecified atrial flutter: Secondary | ICD-10-CM | POA: Diagnosis not present

## 2021-05-20 DIAGNOSIS — M19072 Primary osteoarthritis, left ankle and foot: Secondary | ICD-10-CM | POA: Diagnosis not present

## 2021-05-23 ENCOUNTER — Telehealth: Payer: Self-pay

## 2021-05-23 DIAGNOSIS — M10072 Idiopathic gout, left ankle and foot: Secondary | ICD-10-CM | POA: Diagnosis not present

## 2021-05-23 DIAGNOSIS — I872 Venous insufficiency (chronic) (peripheral): Secondary | ICD-10-CM | POA: Diagnosis not present

## 2021-05-23 DIAGNOSIS — E1140 Type 2 diabetes mellitus with diabetic neuropathy, unspecified: Secondary | ICD-10-CM | POA: Diagnosis not present

## 2021-05-23 DIAGNOSIS — I480 Paroxysmal atrial fibrillation: Secondary | ICD-10-CM | POA: Diagnosis not present

## 2021-05-23 DIAGNOSIS — I10 Essential (primary) hypertension: Secondary | ICD-10-CM | POA: Diagnosis not present

## 2021-05-23 DIAGNOSIS — M19072 Primary osteoarthritis, left ankle and foot: Secondary | ICD-10-CM | POA: Diagnosis not present

## 2021-05-23 DIAGNOSIS — I2510 Atherosclerotic heart disease of native coronary artery without angina pectoris: Secondary | ICD-10-CM | POA: Diagnosis not present

## 2021-05-23 DIAGNOSIS — E1151 Type 2 diabetes mellitus with diabetic peripheral angiopathy without gangrene: Secondary | ICD-10-CM | POA: Diagnosis not present

## 2021-05-23 DIAGNOSIS — I4892 Unspecified atrial flutter: Secondary | ICD-10-CM | POA: Diagnosis not present

## 2021-05-23 MED ORDER — LOSARTAN POTASSIUM 50 MG PO TABS: 50.0000 mg | ORAL_TABLET | Freq: Every day | ORAL | 1 refills | Status: DC

## 2021-05-23 MED ORDER — APIXABAN 5 MG PO TABS
5.0000 mg | ORAL_TABLET | Freq: Two times a day (BID) | ORAL | 1 refills | Status: DC
Start: 2021-05-23 — End: 2021-11-30

## 2021-05-23 MED ORDER — EZETIMIBE 10 MG PO TABS: 10.0000 mg | ORAL_TABLET | Freq: Every day | ORAL | 1 refills | Status: DC

## 2021-05-23 NOTE — Telephone Encounter (Signed)
Pt needs refills Losartan, Zetia, Eliquis to [facility] would like 90 days at a time, (last filled by hospital)

## 2021-05-24 ENCOUNTER — Other Ambulatory Visit: Payer: Self-pay | Admitting: Family Medicine

## 2021-05-24 DIAGNOSIS — L03119 Cellulitis of unspecified part of limb: Secondary | ICD-10-CM

## 2021-05-24 NOTE — Telephone Encounter (Signed)
done

## 2021-05-24 NOTE — Telephone Encounter (Signed)
[facility] is requesting to fill pt diclofenac. Was d/c at the wound center. Please refuse this med. Thanks [employer]

## 2021-05-25 DIAGNOSIS — E1140 Type 2 diabetes mellitus with diabetic neuropathy, unspecified: Secondary | ICD-10-CM | POA: Diagnosis not present

## 2021-05-25 DIAGNOSIS — I872 Venous insufficiency (chronic) (peripheral): Secondary | ICD-10-CM | POA: Diagnosis not present

## 2021-05-25 DIAGNOSIS — I2510 Atherosclerotic heart disease of native coronary artery without angina pectoris: Secondary | ICD-10-CM | POA: Diagnosis not present

## 2021-05-25 DIAGNOSIS — I10 Essential (primary) hypertension: Secondary | ICD-10-CM | POA: Diagnosis not present

## 2021-05-25 DIAGNOSIS — I480 Paroxysmal atrial fibrillation: Secondary | ICD-10-CM | POA: Diagnosis not present

## 2021-05-25 DIAGNOSIS — I4892 Unspecified atrial flutter: Secondary | ICD-10-CM | POA: Diagnosis not present

## 2021-05-25 DIAGNOSIS — M19072 Primary osteoarthritis, left ankle and foot: Secondary | ICD-10-CM | POA: Diagnosis not present

## 2021-05-25 DIAGNOSIS — M10072 Idiopathic gout, left ankle and foot: Secondary | ICD-10-CM | POA: Diagnosis not present

## 2021-05-25 DIAGNOSIS — E1151 Type 2 diabetes mellitus with diabetic peripheral angiopathy without gangrene: Secondary | ICD-10-CM | POA: Diagnosis not present

## 2021-05-27 DIAGNOSIS — I10 Essential (primary) hypertension: Secondary | ICD-10-CM | POA: Diagnosis not present

## 2021-05-27 DIAGNOSIS — I872 Venous insufficiency (chronic) (peripheral): Secondary | ICD-10-CM | POA: Diagnosis not present

## 2021-05-27 DIAGNOSIS — I480 Paroxysmal atrial fibrillation: Secondary | ICD-10-CM | POA: Diagnosis not present

## 2021-05-27 DIAGNOSIS — M10072 Idiopathic gout, left ankle and foot: Secondary | ICD-10-CM | POA: Diagnosis not present

## 2021-05-27 DIAGNOSIS — E1151 Type 2 diabetes mellitus with diabetic peripheral angiopathy without gangrene: Secondary | ICD-10-CM | POA: Diagnosis not present

## 2021-05-27 DIAGNOSIS — E1140 Type 2 diabetes mellitus with diabetic neuropathy, unspecified: Secondary | ICD-10-CM | POA: Diagnosis not present

## 2021-05-27 DIAGNOSIS — M19072 Primary osteoarthritis, left ankle and foot: Secondary | ICD-10-CM | POA: Diagnosis not present

## 2021-05-27 DIAGNOSIS — I2510 Atherosclerotic heart disease of native coronary artery without angina pectoris: Secondary | ICD-10-CM | POA: Diagnosis not present

## 2021-05-27 DIAGNOSIS — I4892 Unspecified atrial flutter: Secondary | ICD-10-CM | POA: Diagnosis not present

## 2021-05-30 DIAGNOSIS — I10 Essential (primary) hypertension: Secondary | ICD-10-CM | POA: Diagnosis not present

## 2021-05-30 DIAGNOSIS — I872 Venous insufficiency (chronic) (peripheral): Secondary | ICD-10-CM | POA: Diagnosis not present

## 2021-05-30 DIAGNOSIS — M19072 Primary osteoarthritis, left ankle and foot: Secondary | ICD-10-CM | POA: Diagnosis not present

## 2021-05-30 DIAGNOSIS — I2510 Atherosclerotic heart disease of native coronary artery without angina pectoris: Secondary | ICD-10-CM | POA: Diagnosis not present

## 2021-05-30 DIAGNOSIS — E1140 Type 2 diabetes mellitus with diabetic neuropathy, unspecified: Secondary | ICD-10-CM | POA: Diagnosis not present

## 2021-05-30 DIAGNOSIS — I480 Paroxysmal atrial fibrillation: Secondary | ICD-10-CM | POA: Diagnosis not present

## 2021-05-30 DIAGNOSIS — M10072 Idiopathic gout, left ankle and foot: Secondary | ICD-10-CM | POA: Diagnosis not present

## 2021-05-30 DIAGNOSIS — E1151 Type 2 diabetes mellitus with diabetic peripheral angiopathy without gangrene: Secondary | ICD-10-CM | POA: Diagnosis not present

## 2021-05-30 DIAGNOSIS — I4892 Unspecified atrial flutter: Secondary | ICD-10-CM | POA: Diagnosis not present

## 2021-06-01 DIAGNOSIS — M19072 Primary osteoarthritis, left ankle and foot: Secondary | ICD-10-CM | POA: Diagnosis not present

## 2021-06-01 DIAGNOSIS — I10 Essential (primary) hypertension: Secondary | ICD-10-CM | POA: Diagnosis not present

## 2021-06-01 DIAGNOSIS — E1151 Type 2 diabetes mellitus with diabetic peripheral angiopathy without gangrene: Secondary | ICD-10-CM | POA: Diagnosis not present

## 2021-06-01 DIAGNOSIS — M10072 Idiopathic gout, left ankle and foot: Secondary | ICD-10-CM | POA: Diagnosis not present

## 2021-06-01 DIAGNOSIS — I872 Venous insufficiency (chronic) (peripheral): Secondary | ICD-10-CM | POA: Diagnosis not present

## 2021-06-01 DIAGNOSIS — I480 Paroxysmal atrial fibrillation: Secondary | ICD-10-CM | POA: Diagnosis not present

## 2021-06-01 DIAGNOSIS — I2510 Atherosclerotic heart disease of native coronary artery without angina pectoris: Secondary | ICD-10-CM | POA: Diagnosis not present

## 2021-06-01 DIAGNOSIS — E1140 Type 2 diabetes mellitus with diabetic neuropathy, unspecified: Secondary | ICD-10-CM | POA: Diagnosis not present

## 2021-06-01 DIAGNOSIS — I4892 Unspecified atrial flutter: Secondary | ICD-10-CM | POA: Diagnosis not present

## 2021-06-07 ENCOUNTER — Other Ambulatory Visit: Payer: Self-pay

## 2021-06-07 ENCOUNTER — Telehealth: Payer: Self-pay

## 2021-06-07 ENCOUNTER — Ambulatory Visit: Payer: Medicare PPO | Admitting: Family Medicine

## 2021-06-07 ENCOUNTER — Encounter: Payer: Self-pay | Admitting: Family Medicine

## 2021-06-07 VITALS — BP 138/70 | HR 84 | Temp 96.1°F | Wt 220.0 lb

## 2021-06-07 DIAGNOSIS — I2510 Atherosclerotic heart disease of native coronary artery without angina pectoris: Secondary | ICD-10-CM | POA: Diagnosis not present

## 2021-06-07 DIAGNOSIS — I10 Essential (primary) hypertension: Secondary | ICD-10-CM | POA: Diagnosis not present

## 2021-06-07 DIAGNOSIS — I872 Venous insufficiency (chronic) (peripheral): Secondary | ICD-10-CM | POA: Diagnosis not present

## 2021-06-07 DIAGNOSIS — E1140 Type 2 diabetes mellitus with diabetic neuropathy, unspecified: Secondary | ICD-10-CM | POA: Diagnosis not present

## 2021-06-07 DIAGNOSIS — M10072 Idiopathic gout, left ankle and foot: Secondary | ICD-10-CM | POA: Diagnosis not present

## 2021-06-07 DIAGNOSIS — I890 Lymphedema, not elsewhere classified: Secondary | ICD-10-CM | POA: Diagnosis not present

## 2021-06-07 DIAGNOSIS — I4892 Unspecified atrial flutter: Secondary | ICD-10-CM | POA: Diagnosis not present

## 2021-06-07 DIAGNOSIS — E1151 Type 2 diabetes mellitus with diabetic peripheral angiopathy without gangrene: Secondary | ICD-10-CM | POA: Diagnosis not present

## 2021-06-07 DIAGNOSIS — M19072 Primary osteoarthritis, left ankle and foot: Secondary | ICD-10-CM | POA: Diagnosis not present

## 2021-06-07 DIAGNOSIS — I480 Paroxysmal atrial fibrillation: Secondary | ICD-10-CM | POA: Diagnosis not present

## 2021-06-07 NOTE — Telephone Encounter (Signed)
Pt was advised [facility] 

## 2021-06-07 NOTE — Telephone Encounter (Signed)
Sending message per Maudie Mercury for Mrs. Kayla [name]. She stated the Tylenol is not helping with the pain and they wanted to check to see if you would call her in something else. She had Robaxin in the past and Mobic in the past back in 2019.

## 2021-06-07 NOTE — Progress Notes (Signed)
   Subjective:    Patient ID: Kayla [name], female    DOB: [birth year], 80 y.o.   MRN: [id number]  HPI Her main complaint today is left lower extremity pain.  She has a previous history of lymphedema as well as wound care management of her lower leg.  She apparently has been using pneumatic compression which does help with the edema.  She states the pain in her lower extremity has been there about 4 months.   Review of Systems     Objective:   Physical Exam Exam of the left lower leg does show edema with tenderness palpation over the distal foot and ankle.  It is quite indurated however is not hot or tender.       Assessment & Plan:  Lymphedema - Plan: Ambulatory referral to Vascular Surgery The area does not appear to be infected but is tender to palpation.  I am truly not sure exactly what is going on with her and is very difficult to get a good history from her.  I do not think wound care reevaluation would be the work.  Hopefully vascular surgery can give [place] some input.

## 2021-06-08 DIAGNOSIS — I4892 Unspecified atrial flutter: Secondary | ICD-10-CM | POA: Diagnosis not present

## 2021-06-08 DIAGNOSIS — M19072 Primary osteoarthritis, left ankle and foot: Secondary | ICD-10-CM | POA: Diagnosis not present

## 2021-06-08 DIAGNOSIS — I480 Paroxysmal atrial fibrillation: Secondary | ICD-10-CM | POA: Diagnosis not present

## 2021-06-08 DIAGNOSIS — E1140 Type 2 diabetes mellitus with diabetic neuropathy, unspecified: Secondary | ICD-10-CM | POA: Diagnosis not present

## 2021-06-08 DIAGNOSIS — I10 Essential (primary) hypertension: Secondary | ICD-10-CM | POA: Diagnosis not present

## 2021-06-08 DIAGNOSIS — M10072 Idiopathic gout, left ankle and foot: Secondary | ICD-10-CM | POA: Diagnosis not present

## 2021-06-08 DIAGNOSIS — I872 Venous insufficiency (chronic) (peripheral): Secondary | ICD-10-CM | POA: Diagnosis not present

## 2021-06-08 DIAGNOSIS — E1151 Type 2 diabetes mellitus with diabetic peripheral angiopathy without gangrene: Secondary | ICD-10-CM | POA: Diagnosis not present

## 2021-06-08 DIAGNOSIS — I2510 Atherosclerotic heart disease of native coronary artery without angina pectoris: Secondary | ICD-10-CM | POA: Diagnosis not present

## 2021-06-09 ENCOUNTER — Telehealth: Payer: Self-pay | Admitting: Family Medicine

## 2021-06-09 ENCOUNTER — Telehealth: Payer: Self-pay

## 2021-06-09 NOTE — Telephone Encounter (Signed)
PT wants you to call her. She said she called earlier [phone]

## 2021-06-09 NOTE — Telephone Encounter (Signed)
Ov note faxed over per pt request.

## 2021-06-09 NOTE — Telephone Encounter (Signed)
Spoke pt and she requested lab and ov notes to be faxed. Per Diane she will take care of this. [place]

## 2021-06-09 NOTE — Telephone Encounter (Signed)
Pt called to advised that she was not happy about her visit on Tuesday. She stated that he is in a  lot of pain and she called [facility] to be  seen. Pt requested to have her last ov note and labs be faxed over to their office. Pt was advise if they needed anything else they shoul request the records by faxing over a request or she could advise [place]. Pt was also asked about a referral which pt advised  [facility] sate she did not need one. [place]

## 2021-06-10 ENCOUNTER — Telehealth: Payer: Self-pay

## 2021-06-10 NOTE — Telephone Encounter (Signed)
Shanda from the Vascular office you referred her to called stating that it may be better at this point for her to be referred to a lymphedema clinic. They saw her back in June and gave her a lymphedema pump which she said she needed help using. They could not have home health go out to help her with this leg pump. So she wanted to know if you still wanted them to evaluate her or did you just want to refer her to a Lymphedema clinic since this seems to be an ongoing issue.

## 2021-06-13 ENCOUNTER — Other Ambulatory Visit: Payer: Self-pay

## 2021-06-14 DIAGNOSIS — I480 Paroxysmal atrial fibrillation: Secondary | ICD-10-CM | POA: Diagnosis not present

## 2021-06-14 DIAGNOSIS — I10 Essential (primary) hypertension: Secondary | ICD-10-CM | POA: Diagnosis not present

## 2021-06-14 DIAGNOSIS — E1151 Type 2 diabetes mellitus with diabetic peripheral angiopathy without gangrene: Secondary | ICD-10-CM | POA: Diagnosis not present

## 2021-06-14 DIAGNOSIS — I872 Venous insufficiency (chronic) (peripheral): Secondary | ICD-10-CM | POA: Diagnosis not present

## 2021-06-14 DIAGNOSIS — E1140 Type 2 diabetes mellitus with diabetic neuropathy, unspecified: Secondary | ICD-10-CM | POA: Diagnosis not present

## 2021-06-14 DIAGNOSIS — I2510 Atherosclerotic heart disease of native coronary artery without angina pectoris: Secondary | ICD-10-CM | POA: Diagnosis not present

## 2021-06-14 DIAGNOSIS — M19072 Primary osteoarthritis, left ankle and foot: Secondary | ICD-10-CM | POA: Diagnosis not present

## 2021-06-14 DIAGNOSIS — M10072 Idiopathic gout, left ankle and foot: Secondary | ICD-10-CM | POA: Diagnosis not present

## 2021-06-14 DIAGNOSIS — I4892 Unspecified atrial flutter: Secondary | ICD-10-CM | POA: Diagnosis not present

## 2021-06-15 DIAGNOSIS — E1140 Type 2 diabetes mellitus with diabetic neuropathy, unspecified: Secondary | ICD-10-CM | POA: Diagnosis not present

## 2021-06-15 DIAGNOSIS — M10072 Idiopathic gout, left ankle and foot: Secondary | ICD-10-CM | POA: Diagnosis not present

## 2021-06-15 DIAGNOSIS — E1151 Type 2 diabetes mellitus with diabetic peripheral angiopathy without gangrene: Secondary | ICD-10-CM | POA: Diagnosis not present

## 2021-06-15 DIAGNOSIS — I4892 Unspecified atrial flutter: Secondary | ICD-10-CM | POA: Diagnosis not present

## 2021-06-15 DIAGNOSIS — I2510 Atherosclerotic heart disease of native coronary artery without angina pectoris: Secondary | ICD-10-CM | POA: Diagnosis not present

## 2021-06-15 DIAGNOSIS — I872 Venous insufficiency (chronic) (peripheral): Secondary | ICD-10-CM | POA: Diagnosis not present

## 2021-06-15 DIAGNOSIS — I480 Paroxysmal atrial fibrillation: Secondary | ICD-10-CM | POA: Diagnosis not present

## 2021-06-15 DIAGNOSIS — M19072 Primary osteoarthritis, left ankle and foot: Secondary | ICD-10-CM | POA: Diagnosis not present

## 2021-06-15 DIAGNOSIS — I10 Essential (primary) hypertension: Secondary | ICD-10-CM | POA: Diagnosis not present

## 2021-06-16 ENCOUNTER — Telehealth: Payer: Self-pay

## 2021-06-16 NOTE — Telephone Encounter (Signed)
Pt called and advised she is in a lot of pain and the otc meds are not working.Pt does not want any more diclofenac. Pt has made her self an appt with Bone and joint doctor and I ave advised that they will need to send a records request of what they need to treat pt. Pt was also advised that the vascular referral has been approved and she should get a call to schedule her soon. Please advise if something can be called in for her.

## 2021-06-16 NOTE — Telephone Encounter (Signed)
Pt wants to know about her liver kidney functions per her last labs. [place]

## 2021-06-17 NOTE — Telephone Encounter (Signed)
Asked SJ to call pt and she advised she would [facility]

## 2021-06-17 NOTE — Telephone Encounter (Signed)
Ask SJ to call pt and she advised she would . [place]

## 2021-06-20 ENCOUNTER — Telehealth: Payer: Self-pay | Admitting: Family Medicine

## 2021-06-20 DIAGNOSIS — I872 Venous insufficiency (chronic) (peripheral): Secondary | ICD-10-CM | POA: Diagnosis not present

## 2021-06-20 DIAGNOSIS — I10 Essential (primary) hypertension: Secondary | ICD-10-CM | POA: Diagnosis not present

## 2021-06-20 DIAGNOSIS — I480 Paroxysmal atrial fibrillation: Secondary | ICD-10-CM | POA: Diagnosis not present

## 2021-06-20 DIAGNOSIS — M19072 Primary osteoarthritis, left ankle and foot: Secondary | ICD-10-CM | POA: Diagnosis not present

## 2021-06-20 DIAGNOSIS — I2510 Atherosclerotic heart disease of native coronary artery without angina pectoris: Secondary | ICD-10-CM | POA: Diagnosis not present

## 2021-06-20 DIAGNOSIS — M10072 Idiopathic gout, left ankle and foot: Secondary | ICD-10-CM | POA: Diagnosis not present

## 2021-06-20 DIAGNOSIS — I4892 Unspecified atrial flutter: Secondary | ICD-10-CM | POA: Diagnosis not present

## 2021-06-20 DIAGNOSIS — E1140 Type 2 diabetes mellitus with diabetic neuropathy, unspecified: Secondary | ICD-10-CM | POA: Diagnosis not present

## 2021-06-20 DIAGNOSIS — E1151 Type 2 diabetes mellitus with diabetic peripheral angiopathy without gangrene: Secondary | ICD-10-CM | POA: Diagnosis not present

## 2021-06-20 NOTE — Telephone Encounter (Signed)
Becky from [facility] home health called and said pts left knee was in a lot of pain today and she is not able to stand up as long as she used to. Kayla [name] can be reached at [phone]

## 2021-06-21 NOTE — Telephone Encounter (Signed)
Pt was advised [facility] 

## 2021-06-21 NOTE — Telephone Encounter (Signed)
Pt was advised Kh 

## 2021-06-21 NOTE — Telephone Encounter (Signed)
LVM for pt to call back [facility]

## 2021-06-21 NOTE — Telephone Encounter (Signed)
LVM for pt to call back . [place]

## 2021-06-22 ENCOUNTER — Inpatient Hospital Stay (HOSPITAL_COMMUNITY)
Admission: EM | Admit: 2021-06-22 | Discharge: 2021-06-30 | DRG: 552 | Disposition: A | Discharge status: Skilled Nursing Facility | Payer: Medicare PPO | Attending: Internal Medicine | Admitting: Internal Medicine

## 2021-06-22 ENCOUNTER — Telehealth: Payer: Self-pay

## 2021-06-22 ENCOUNTER — Emergency Department (HOSPITAL_COMMUNITY): Payer: Medicare PPO

## 2021-06-22 DIAGNOSIS — G8929 Other chronic pain: Secondary | ICD-10-CM | POA: Diagnosis not present

## 2021-06-22 DIAGNOSIS — M4316 Spondylolisthesis, lumbar region: Secondary | ICD-10-CM | POA: Diagnosis present

## 2021-06-22 DIAGNOSIS — M109 Gout, unspecified: Secondary | ICD-10-CM | POA: Diagnosis present

## 2021-06-22 DIAGNOSIS — D649 Anemia, unspecified: Secondary | ICD-10-CM | POA: Diagnosis present

## 2021-06-22 DIAGNOSIS — Z794 Long term (current) use of insulin: Secondary | ICD-10-CM | POA: Diagnosis not present

## 2021-06-22 DIAGNOSIS — Z8249 Family history of ischemic heart disease and other diseases of the circulatory system: Secondary | ICD-10-CM | POA: Diagnosis not present

## 2021-06-22 DIAGNOSIS — R2689 Other abnormalities of gait and mobility: Secondary | ICD-10-CM | POA: Diagnosis not present

## 2021-06-22 DIAGNOSIS — F0390 Unspecified dementia without behavioral disturbance: Secondary | ICD-10-CM | POA: Diagnosis present

## 2021-06-22 DIAGNOSIS — Z6837 Body mass index (BMI) 37.0-37.9, adult: Secondary | ICD-10-CM | POA: Diagnosis not present

## 2021-06-22 DIAGNOSIS — M48062 Spinal stenosis, lumbar region with neurogenic claudication: Secondary | ICD-10-CM | POA: Diagnosis not present

## 2021-06-22 DIAGNOSIS — Z28311 Partially vaccinated for covid-19: Secondary | ICD-10-CM | POA: Diagnosis not present

## 2021-06-22 DIAGNOSIS — R0902 Hypoxemia: Secondary | ICD-10-CM | POA: Diagnosis not present

## 2021-06-22 DIAGNOSIS — R531 Weakness: Secondary | ICD-10-CM | POA: Diagnosis present

## 2021-06-22 DIAGNOSIS — M79662 Pain in left lower leg: Secondary | ICD-10-CM | POA: Diagnosis not present

## 2021-06-22 DIAGNOSIS — R41841 Cognitive communication deficit: Secondary | ICD-10-CM | POA: Diagnosis not present

## 2021-06-22 DIAGNOSIS — I482 Chronic atrial fibrillation, unspecified: Secondary | ICD-10-CM | POA: Diagnosis present

## 2021-06-22 DIAGNOSIS — M4800 Spinal stenosis, site unspecified: Secondary | ICD-10-CM | POA: Diagnosis present

## 2021-06-22 DIAGNOSIS — I10 Essential (primary) hypertension: Secondary | ICD-10-CM | POA: Diagnosis present

## 2021-06-22 DIAGNOSIS — R200 Anesthesia of skin: Secondary | ICD-10-CM | POA: Diagnosis present

## 2021-06-22 DIAGNOSIS — Z79899 Other long term (current) drug therapy: Secondary | ICD-10-CM

## 2021-06-22 DIAGNOSIS — R262 Difficulty in walking, not elsewhere classified: Secondary | ICD-10-CM | POA: Diagnosis present

## 2021-06-22 DIAGNOSIS — E119 Type 2 diabetes mellitus without complications: Secondary | ICD-10-CM

## 2021-06-22 DIAGNOSIS — I4891 Unspecified atrial fibrillation: Secondary | ICD-10-CM | POA: Diagnosis present

## 2021-06-22 DIAGNOSIS — E11618 Type 2 diabetes mellitus with other diabetic arthropathy: Secondary | ICD-10-CM | POA: Diagnosis not present

## 2021-06-22 DIAGNOSIS — M4807 Spinal stenosis, lumbosacral region: Secondary | ICD-10-CM | POA: Diagnosis not present

## 2021-06-22 DIAGNOSIS — E1169 Type 2 diabetes mellitus with other specified complication: Secondary | ICD-10-CM | POA: Diagnosis present

## 2021-06-22 DIAGNOSIS — M48061 Spinal stenosis, lumbar region without neurogenic claudication: Principal | ICD-10-CM | POA: Diagnosis present

## 2021-06-22 DIAGNOSIS — Z7984 Long term (current) use of oral hypoglycemic drugs: Secondary | ICD-10-CM

## 2021-06-22 DIAGNOSIS — I890 Lymphedema, not elsewhere classified: Secondary | ICD-10-CM | POA: Diagnosis present

## 2021-06-22 DIAGNOSIS — Z20822 Contact with and (suspected) exposure to covid-19: Secondary | ICD-10-CM | POA: Diagnosis present

## 2021-06-22 DIAGNOSIS — M79605 Pain in left leg: Secondary | ICD-10-CM

## 2021-06-22 DIAGNOSIS — Z86711 Personal history of pulmonary embolism: Secondary | ICD-10-CM

## 2021-06-22 DIAGNOSIS — R52 Pain, unspecified: Secondary | ICD-10-CM | POA: Diagnosis not present

## 2021-06-22 DIAGNOSIS — Z7901 Long term (current) use of anticoagulants: Secondary | ICD-10-CM | POA: Diagnosis not present

## 2021-06-22 DIAGNOSIS — E785 Hyperlipidemia, unspecified: Secondary | ICD-10-CM | POA: Diagnosis present

## 2021-06-22 DIAGNOSIS — M6281 Muscle weakness (generalized): Secondary | ICD-10-CM | POA: Diagnosis not present

## 2021-06-22 DIAGNOSIS — M5416 Radiculopathy, lumbar region: Secondary | ICD-10-CM | POA: Diagnosis present

## 2021-06-22 DIAGNOSIS — I4892 Unspecified atrial flutter: Secondary | ICD-10-CM | POA: Diagnosis present

## 2021-06-22 DIAGNOSIS — L89626 Pressure-induced deep tissue damage of left heel: Secondary | ICD-10-CM | POA: Diagnosis present

## 2021-06-22 DIAGNOSIS — R609 Edema, unspecified: Secondary | ICD-10-CM | POA: Diagnosis not present

## 2021-06-22 DIAGNOSIS — Z7401 Bed confinement status: Secondary | ICD-10-CM | POA: Diagnosis not present

## 2021-06-22 DIAGNOSIS — E6609 Other obesity due to excess calories: Secondary | ICD-10-CM | POA: Diagnosis present

## 2021-06-22 HISTORY — DX: Chronic atrial fibrillation, unspecified: I48.20

## 2021-06-22 LAB — CBC WITH DIFFERENTIAL/PLATELET
Abs Immature Granulocytes: 0.01 10*3/uL (ref 0.00–0.07)
Basophils Absolute: 0 10*3/uL (ref 0.0–0.1)
Basophils Relative: 0 %
Eosinophils Absolute: 0.1 10*3/uL (ref 0.0–0.5)
Eosinophils Relative: 2 %
HCT: 30.7 % — ABNORMAL LOW (ref 36.0–46.0)
Hemoglobin: 9.7 g/dL — ABNORMAL LOW (ref 12.0–15.0)
Immature Granulocytes: 0 %
Lymphocytes Relative: 16 %
Lymphs Abs: 1.1 10*3/uL (ref 0.7–4.0)
MCH: 26.3 pg (ref 26.0–34.0)
MCHC: 31.6 g/dL (ref 30.0–36.0)
MCV: 83.2 fL (ref 80.0–100.0)
Monocytes Absolute: 0.7 10*3/uL (ref 0.1–1.0)
Monocytes Relative: 10 %
Neutro Abs: 5.1 10*3/uL (ref 1.7–7.7)
Neutrophils Relative %: 72 %
Platelets: 328 10*3/uL (ref 150–400)
RBC: 3.69 MIL/uL — ABNORMAL LOW (ref 3.87–5.11)
RDW: 14.6 % (ref 11.5–15.5)
WBC: 7.1 10*3/uL (ref 4.0–10.5)
nRBC: 0 % (ref 0.0–0.2)

## 2021-06-22 LAB — COMPREHENSIVE METABOLIC PANEL
ALT: 11 U/L (ref 0–44)
AST: 18 U/L (ref 15–41)
Albumin: 3 g/dL — ABNORMAL LOW (ref 3.5–5.0)
Alkaline Phosphatase: 87 U/L (ref 38–126)
Anion gap: 10 (ref 5–15)
BUN: 21 mg/dL (ref 8–23)
CO2: 25 mmol/L (ref 22–32)
Calcium: 9 mg/dL (ref 8.9–10.3)
Chloride: 98 mmol/L (ref 98–111)
Creatinine, Ser: 1.03 mg/dL — ABNORMAL HIGH (ref 0.44–1.00)
GFR, Estimated: 54 mL/min — ABNORMAL LOW (ref >60)
Glucose, Bld: 124 mg/dL — ABNORMAL HIGH (ref 70–99)
Potassium: 4.5 mmol/L (ref 3.5–5.1)
Sodium: 133 mmol/L — ABNORMAL LOW (ref 135–145)
Total Bilirubin: 0.8 mg/dL (ref 0.3–1.2)
Total Protein: 7.9 g/dL (ref 6.5–8.1)

## 2021-06-22 LAB — URINALYSIS, ROUTINE W REFLEX MICROSCOPIC
Bilirubin Urine: NEGATIVE
Glucose, UA: NEGATIVE mg/dL
Hgb urine dipstick: NEGATIVE
Ketones, ur: NEGATIVE mg/dL
Leukocytes,Ua: NEGATIVE
Nitrite: NEGATIVE
Protein, ur: NEGATIVE mg/dL
Specific Gravity, Urine: 1.014 (ref 1.005–1.030)
pH: 5 (ref 5.0–8.0)

## 2021-06-22 LAB — RESP PANEL BY RT-PCR (FLU A&B, COVID) ARPGX2
Influenza A by PCR: NEGATIVE
Influenza B by PCR: NEGATIVE
SARS Coronavirus 2 by RT PCR: NEGATIVE

## 2021-06-22 LAB — TSH: TSH: 1.794 u[IU]/mL (ref 0.350–4.500)

## 2021-06-22 MED ORDER — ACETAMINOPHEN 325 MG PO TABS
650.0000 mg | ORAL_TABLET | Freq: Three times a day (TID) | ORAL | Status: DC | PRN
  Administered 2021-06-23: 650 mg via ORAL
  Filled 2021-06-22: qty 2

## 2021-06-22 MED ORDER — LORAZEPAM 1 MG PO TABS
1.0000 mg | ORAL_TABLET | ORAL | Status: AC | PRN
  Administered 2021-06-22: 1 mg via ORAL
  Filled 2021-06-22: qty 1

## 2021-06-22 MED ORDER — LORAZEPAM 2 MG/ML IJ SOLN: 0.5000 mg | INTRAMUSCULAR | Status: DC | PRN

## 2021-06-22 MED ORDER — ACETAMINOPHEN ER 650 MG PO TBCR: 650.0000 mg | EXTENDED_RELEASE_TABLET | Freq: Three times a day (TID) | ORAL | Status: DC | PRN

## 2021-06-22 MED ORDER — METFORMIN HCL 500 MG PO TABS
500.0000 mg | ORAL_TABLET | Freq: Two times a day (BID) | ORAL | Status: DC
  Administered 2021-06-23: 500 mg via ORAL
  Filled 2021-06-22: qty 1

## 2021-06-22 MED ORDER — CARVEDILOL 3.125 MG PO TABS
6.2500 mg | ORAL_TABLET | Freq: Two times a day (BID) | ORAL | Status: DC
  Administered 2021-06-22: 6.25 mg via ORAL
  Filled 2021-06-22 (×2): qty 2

## 2021-06-22 MED ORDER — APIXABAN 5 MG PO TABS
5.0000 mg | ORAL_TABLET | Freq: Two times a day (BID) | ORAL | Status: DC
  Administered 2021-06-22 – 2021-06-30 (×16): 5 mg via ORAL
  Filled 2021-06-22 (×16): qty 1

## 2021-06-22 MED ORDER — LOSARTAN POTASSIUM 50 MG PO TABS
50.0000 mg | ORAL_TABLET | Freq: Every day | ORAL | Status: DC
  Administered 2021-06-22 – 2021-06-30 (×9): 50 mg via ORAL
  Filled 2021-06-22 (×9): qty 1

## 2021-06-22 MED ORDER — AMITRIPTYLINE HCL 10 MG PO TABS: 10.0000 mg | ORAL_TABLET | Freq: Every day | ORAL | 1 refills | Status: DC

## 2021-06-22 NOTE — Addendum Note (Signed)
Addended by: Denita Lung on: 06/22/2021 03:20 PM   Modules accepted: Orders

## 2021-06-22 NOTE — Telephone Encounter (Signed)
Illene Silver nurse with [facility] called to report pt having BLE neuropathy pain and is worse in left leg than right. States pt is only taking tylenol for this and is requesting Gabapentin to try.  Lias call back if needed is [phone]

## 2021-06-22 NOTE — ED Triage Notes (Signed)
Pt BIB [facility] from home c/o L leg pain& generalized weakness. Per EMS, pt hasn't gotten up for  6 days. A&O x 4.    VS w EMS:  151/108 96 pulse 100% RA Cbg 152

## 2021-06-22 NOTE — ED Provider Notes (Signed)
[facility] EMERGENCY DEPARTMENT Provider Note   CSN: [id number] Arrival date & time: 06/22/21  1610     History Chief Complaint  Patient presents with   Weakness    Kayla [name] is a 80 y.o. female with a past medical history of diabetes, hypertension, PE presents to the ED brought in by EMS complaining of left leg pain onset 5 days.  Patient and son report patient with chronic history of left lower leg pain.  Patient is currently on Eliquis due to prior PE.  She ambulates with a walker at baseline.  Patient has been immobile x6 days due to increased left leg pain.  She has associated left leg pain.  Patient has not tried any medications for her symptoms.  Patient denies chest pain, shortness of breath, abdominal pain, nausea, vomiting, dizziness, lightheadedness, cough.  She is currently on Eliquis.   The history is provided by the patient. No language interpreter was used.      Past Medical History:  Diagnosis Date   Arthritis    Diabetes mellitus    Hypertension    Obesity    Pulmonary embolus [facility])     Patient Active Problem List   Diagnosis Date Noted   Atrial flutter ([place]) 03/06/2021   Atrial flutter with rapid ventricular response ([place]) 03/05/2021   AKI (acute kidney injury) ([place]) 03/05/2021   Type 2 diabetes mellitus ([place]) 03/05/2021   Elevated troponin    Anemia due to chronic blood loss 08/30/2020   Constipation 08/30/2020   Feces contents abnormal 08/30/2020   Hemorrhoids 08/30/2020   Proctalgia 08/30/2020   Rectal bleeding 08/30/2020   Lymphedema 12/23/2019   Arthritis 12/18/2018   Fatigue 07/09/2018   Gastroesophageal reflux disease without esophagitis 04/16/2018   Dependent edema 01/01/2017   Anemia of chronic illness 02/15/2015   Multiple falls 02/15/2015   Diabetic neuropathy, type II diabetes mellitus ([place]) 09/09/2012   Hyperlipidemia associated with type 2 diabetes mellitus ([place]) 05/22/2011   Primary hypertension 05/22/2011    Morbid obesity ([place]) 12/14/2010   History of pulmonary embolus ([place]) 12/14/2010    Past Surgical History:  Procedure Laterality Date   ABDOMINAL HYSTERECTOMY     CHOLECYSTECTOMY     JOINT REPLACEMENT  Right     OB History   No obstetric history on file.     Family History  Problem Relation Age of Onset   Hypertension Mother     Social History   Tobacco Use   Smoking status: Never   Smokeless tobacco: Never  Vaping Use   Vaping Use: Never used  Substance Use Topics   Alcohol use: No   Drug use: No    Home Medications Prior to Admission medications   Medication Sig Start Date End Date Taking? Authorizing Provider  Accu-Chek Softclix Lancets lancets 1 each by Other route daily. Use as instructed 03/30/20   Denita Lung, MD  acetaminophen (TYLENOL) 650 MG CR tablet Take 650 mg by mouth every 8 (eight) hours as needed for pain.    [provider]  amitriptyline (ELAVIL) 10 MG tablet Take 1 tablet (10 mg total) by mouth at bedtime. 06/22/21   Denita Lung, MD  ammonium lactate (LAC-HYDRIN) 12 % lotion Apply 1 application topically as needed for dry skin. 03/31/21   Tysinger, Camelia Eng, PA-C  apixaban (ELIQUIS) 5 MG TABS tablet Take 1 tablet (5 mg total) by mouth 2 (two) times daily. 05/23/21 06/22/21  Denita Lung, MD  atorvastatin (LIPITOR)  80 MG tablet Take 1 tablet (80 mg total) by mouth daily. 01/20/20   Denita Lung, MD  carvedilol (COREG) 6.25 MG tablet Take 1 tablet (6.25 mg total) by mouth 2 (two) times daily. 03/31/21   Rudean Haskell A, MD  diclofenac Sodium (VOLTAREN) 1 % GEL Apply 2 g topically 4 (four) times daily. Rub into affected area of foot 2 to 4 times daily 03/31/21   Tysinger, Camelia Eng, PA-C  ezetimibe (ZETIA) 10 MG tablet Take 1 tablet (10 mg total) by mouth daily. 05/23/21 06/22/21  Denita Lung, MD  furosemide (LASIX) 20 MG tablet Take 1 tablet (20 mg total) by mouth daily as needed (take as needed for swelling). 03/31/21    Chandrasekhar, Mahesh A, MD  glucose blood test strip 1 each by Other route as needed for other. Use as instructed 12/31/20   Denita Lung, MD  losartan (COZAAR) 50 MG tablet Take 1 tablet (50 mg total) by mouth daily. 05/23/21 06/22/21  Denita Lung, MD  metFORMIN (GLUCOPHAGE) 500 MG tablet Take 1 tablet (500 mg total) by mouth 2 (two) times daily with a meal. 12/28/20   Denita Lung, MD  nitroGLYCERIN (NITROSTAT) 0.4 MG SL tablet Take up to 3 tablets 03/31/21   Chandrasekhar, Mahesh A, MD  TRIAMCINOLONE ACETONIDE, TOP, 0.05 % OINT Apply 1 application topically 2 (two) times daily as needed (rough skin on heels). 03/31/21   Tysinger, Camelia Eng, PA-C    Allergies    Patient has no known allergies.  Review of Systems   Review of Systems  Constitutional:  Negative for chills and fever.  Respiratory:  Negative for cough and shortness of breath.   Cardiovascular:  Negative for chest pain.  Gastrointestinal:  Negative for abdominal pain, nausea and vomiting.  Musculoskeletal:  Positive for arthralgias. Negative for joint swelling.  Skin:  Negative for color change and wound.  Neurological:  Negative for dizziness, weakness, light-headedness and numbness.  All other systems reviewed and are negative.  Physical Exam Updated Vital Signs BP (!) 155/116   Pulse 85   Temp 98.4 F (36.9 C) (Oral)   Resp 18   SpO2 100% Comment: on R ear  Physical Exam Vitals and nursing note reviewed.  Constitutional:      General: She is not in acute distress.    Appearance: She is not diaphoretic.  HENT:     Head: Normocephalic and atraumatic.     Mouth/Throat:     Pharynx: No oropharyngeal exudate.  Eyes:     General: No scleral icterus.    Conjunctiva/sclera: Conjunctivae normal.  Cardiovascular:     Rate and Rhythm: Regular rhythm.     Pulses: Normal pulses.     Heart sounds: Normal heart sounds.  Pulmonary:     Effort: Pulmonary effort is normal. No respiratory distress.     Breath sounds:  Normal breath sounds. No wheezing.  Abdominal:     General: Bowel sounds are normal.     Palpations: Abdomen is soft. There is no mass.     Tenderness: There is no abdominal tenderness. There is no guarding or rebound.  Musculoskeletal:        General: Normal range of motion.     Cervical back: Normal range of motion and neck supple.     Comments: 2+ pitting edema noted bilaterally.  No unilateral leg swelling.  No ecchymosis or erythema.  No increased warmth.  DP pulses intact bilaterally.  No obvious deformity or wound.  Skin:    General: Skin is warm and dry.  Neurological:     Mental Status: She is alert.  Psychiatric:        Behavior: Behavior normal.    ED Results / Procedures / Treatments   Labs (all labs ordered are listed, but only abnormal results are displayed) Labs Reviewed  COMPREHENSIVE METABOLIC PANEL - Abnormal; Notable for the following components:      Result Value   Sodium 133 (*)    Glucose, Bld 124 (*)    Creatinine, Ser 1.03 (*)    Albumin 3.0 (*)    GFR, Estimated 54 (*)    All other components within normal limits  CBC WITH DIFFERENTIAL/PLATELET - Abnormal; Notable for the following components:   RBC 3.69 (*)    Hemoglobin 9.7 (*)    HCT 30.7 (*)    All other components within normal limits  TSH  MAGNESIUM    EKG None  Radiology No results found.  Procedures Procedures   Medications Ordered in ED Medications - No data to display  ED Course  I have reviewed the triage vital signs and the nursing notes.  Pertinent labs & imaging results that were available during my care of the patient were reviewed by me and considered in my medical decision making (see chart for details).  Clinical Course as of 06/22/21 1903  Wed Jun 22, 2021  1716 Discussed patient case with attending who agrees with treatment plan.  [SB]  1717 Extensive conversation with patient's son regarding treatment course. [SB]    Clinical Course User Index [SB] Unika Nazareno,  Elly Haffey A, PA-C   MDM Rules/Calculators/A&P                         Patient presenting to the ED complaining of left lower leg pain onset 5 days.  Patient ambulates with a walker at baseline. No chest pain or shortness of breath. Patient currently on Eliquis due to prior PE.  No prior injury or trauma. No concern for bony abnormality.  Normal neurological exam. Due to no recent injury or trauma, x-rays not indicated at this time. On exam, patient with 2+ bilateral pitting edema, otherwise, no acute cardiovascular, respiratory, or abdominal exam findings.  This is likely musculoskeletal in nature, however patient with history of neuropathy.  Patient with adequate primary care follow-up and recent referral to lymphedema clinic.  CBC and CMP stable. Vital Signs stable, patient afebrile, oxygen saturation 100%. Labs pending at sign out.   At signout, patient case discussed with attending, Dr. Armandina Gemma who will evaluate patient.  Patient care transferred to attending at signout.  Final Clinical Impression(s) / ED Diagnoses Final diagnoses:  Pain of left lower extremity    Rx / DC Orders ED Discharge Orders     None        Saide Lanuza A, PA-C 06/22/21 1903    Regan Lemming, MD 06/22/21 2317

## 2021-06-22 NOTE — ED Provider Notes (Addendum)
  Physical Exam  BP (!) 155/116   Pulse 85   Temp 98.4 F (36.9 C) (Oral)   Resp 18   SpO2 100% Comment: on R ear   ED Course/Procedures   Clinical Course as of 06/22/21 1916  Wed Jun 22, 2021  1716 Discussed patient case with attending who agrees with treatment plan.  [SB]  1717 Extensive conversation with patient's son regarding treatment course. [SB]    Clinical Course User Index [SB] Blue, Soijett A, PA-C    Procedures  MDM    80 year old female with medical history significant for diabetes, hypertension, PE on Eliquis, chronic lymphedema who presents to the emergency department with difficulty with ambulation.  She normally ambulates with a walker at baseline.  She states that for the past 5 days she has been completely unable to ambulate due to severe pain in her left leg.  She states that the pain shoots down her left leg.  She has some burning component.  She does have a history of chronic leg pain and chronic lymphedema for which she is treated outpatient with medications however, she states that her difficulty ambulating is new.  It is gotten to the point where she sits in bed and is incontinent of urine and stool on herself because she cannot get up to go to the bathroom.  He states that she has been peeing on herself with increased urinary frequency over the past few days.  The patient has intact strength bilaterally in the lower extremities with intact sensation.  She has chronic appearing changes associated with lymphedema.  No recent falls or trauma.  Her leg swelling is not unilateral and she is already on treatment with Eliquis for blood clots.  She has intact DP pulses bilaterally with no obvious deformity or injury noted.  No clear changes to suggest overlying cellulitic component to the patient's lymphedema.  Given the patient's new difficulty ambulation with what seems to be possibly radicular pain, CT of the lumbar spine was obtained which revealed L3-L4 and L4-L5  moderate to severe spinal canal stenosis and moderate bilateral neural for foraminal narrowing.  Given the patient's report of urinary incontinence and new difficulty with ambulation, MRI of the L-spine was ordered.  MRI of the L-spine resulted negative for compression of the cauda equina or conus medullarus syndromes, did reveal significant severe spinal canal stenosis at multiple levels which resulted in some spinal cord compression.  IMPRESSION: 1. L3-L4 moderate to severe spinal canal stenosis and mild-to-moderate left neural foraminal narrowing. Effacement of the lateral recesses at this level likely compresses the descending L4 nerves. 2. L2-L3 moderate spinal canal stenosis. Effacement of the lateral recesses at this level likely compresses the descending L3 nerves. 3. L4-L5 mild spinal canal stenosis and mild left neural foraminal narrowing. Narrowing of the lateral recesses at this level could affect the descending L5 nerves. 4. Narrowing of the lateral recesses at L1-L2 could affect the descending L2 nerves.  No acute intervention by neurosurgery indicated at this time, hospitalist medicine consulted for admission. Informed by Dr. Posey Pronto that multiple admissions currently ahead of patient. Patient subsequently signed out to Dr. Roxanne Mins at [id number].     Regan Lemming, MD 06/23/21 [phone]

## 2021-06-22 NOTE — Telephone Encounter (Signed)
Called son and notified of medications. He states Kayla [name] is on her way to hospital now for leg pain. Verbally made Dr. Redmond School aware of this

## 2021-06-22 NOTE — ED Notes (Signed)
Pt provided with lunch bag and water.

## 2021-06-23 ENCOUNTER — Encounter (HOSPITAL_COMMUNITY): Payer: Self-pay | Admitting: Internal Medicine

## 2021-06-23 ENCOUNTER — Other Ambulatory Visit: Payer: Self-pay

## 2021-06-23 DIAGNOSIS — M4800 Spinal stenosis, site unspecified: Secondary | ICD-10-CM | POA: Diagnosis present

## 2021-06-23 DIAGNOSIS — R531 Weakness: Secondary | ICD-10-CM

## 2021-06-23 DIAGNOSIS — R262 Difficulty in walking, not elsewhere classified: Secondary | ICD-10-CM | POA: Diagnosis not present

## 2021-06-23 LAB — GLUCOSE, CAPILLARY
Glucose-Capillary: 109 mg/dL — ABNORMAL HIGH (ref 70–99)
Glucose-Capillary: 107 mg/dL — ABNORMAL HIGH (ref 70–99)

## 2021-06-23 MED ORDER — CARVEDILOL 6.25 MG PO TABS
6.2500 mg | ORAL_TABLET | Freq: Two times a day (BID) | ORAL | Status: DC
  Administered 2021-06-23 – 2021-06-30 (×15): 6.25 mg via ORAL
  Filled 2021-06-23: qty 1
  Filled 2021-06-23: qty 2
  Filled 2021-06-23 (×3): qty 1
  Filled 2021-06-23: qty 2
  Filled 2021-06-23 (×5): qty 1
  Filled 2021-06-23: qty 2
  Filled 2021-06-23: qty 1
  Filled 2021-06-23: qty 2
  Filled 2021-06-23: qty 1

## 2021-06-23 MED ORDER — INSULIN ASPART 100 UNIT/ML IJ SOLN
0.0000 [IU] | Freq: Three times a day (TID) | INTRAMUSCULAR | Status: DC
Start: 2021-06-23 — End: 2021-06-24

## 2021-06-23 MED ORDER — ATORVASTATIN CALCIUM 80 MG PO TABS
80.0000 mg | ORAL_TABLET | Freq: Every day | ORAL | Status: DC
  Administered 2021-06-23 – 2021-06-29 (×7): 80 mg via ORAL
  Filled 2021-06-23 (×7): qty 1

## 2021-06-23 MED ORDER — ACETAMINOPHEN 325 MG PO TABS
650.0000 mg | ORAL_TABLET | Freq: Four times a day (QID) | ORAL | Status: DC | PRN
  Administered 2021-06-24 – 2021-06-30 (×4): 650 mg via ORAL
  Filled 2021-06-23 (×4): qty 2

## 2021-06-23 MED ORDER — INSULIN ASPART 100 UNIT/ML IJ SOLN: 0.0000 [IU] | Freq: Every day | INTRAMUSCULAR | Status: DC

## 2021-06-23 MED ORDER — ACETAMINOPHEN 650 MG RE SUPP: 650.0000 mg | Freq: Four times a day (QID) | RECTAL | Status: DC | PRN

## 2021-06-23 NOTE — ED Notes (Signed)
On arrival into room, patient has removed monitoring equipment, gown, and purewick. Patient's sheets saturated. Pt able to answer all orientation questions appropriately, bed linens changed, and re-attached to monitor.

## 2021-06-23 NOTE — Progress Notes (Signed)
Son, Axelle Szwed, called and asked that I contact him.  I called and spoke with him.  He reports that she has had a persistent issue with L leg pain.  It has been constant but the pain was so much worse since last week that she was progressively immobile.  When her son arrived yesterday for the holiday, she was weak, not eating that well.  She has been diagnosed with lymphedema and she was taking Lasix.   He is thinking that she has been using her electric recliner but the motor died and she hasn't been able to elevate her legs and this may be contributing.    We discussed her spinal stenosis and the fact that she requested no neurosurgical consult.  He was present with her when she talked with Dr. Sharol Given in the past and she was not a surgical candidate at that time.    They have noticed some confusion which they have attributed to pain.  He has seen her in the past with some leg stiffness and "thought irregularities.  Under normal circumstances", she does not think she has dementia.  She did go to rehab at [facility] and since then she has had therapy.  He would prefer that she have the root cause evaluated rather than simply do rehab.  With further discussion, he seems to understand that this not repairable without surgery.  He would like for the neurosurgeons to see her tomorrow - "bring in the [place]."  Secure Chat message sent to Dr. Annette Stable.  PT/OT/ST evaluations also ordered.   Carlyon Shadow, M.D.

## 2021-06-23 NOTE — ED Provider Notes (Signed)
Care assumed from Dr. Armandina Gemma, patient with inability to walk, MRI of lumbar spine not showing evidence of cauda equina syndrome.  Will need to be admitted for further evaluation.  Case is discussed with Dr. Velia Meyer of [facility], who agrees to admit the patient.   Delora Fuel, MD 82/50/53 [phone]

## 2021-06-23 NOTE — H&P (Signed)
History and Physical    Kayla [name] AOZ:[id number] DOB: Jul 26, 1941 DOA: 06/22/2021  PCP: Kayla Lung, MD Consultants:  Kayla [name] - podiatry; [place] - cardiology; Kayla [name] - wound care; Kayla [name] - vascular surgery Patient coming from:  Home - lives alone; NOK: Kayla [name], son, [phone]  Chief Complaint: Ambulatory dysfunction  HPI: Kayla [name] is [name] 80 y.o. female with medical history significant of DM; HTN; afib on Eliquis; and obesity presenting with ambulatory dysfunction.  She initially came in for aflutter in August.  The pain got so bad that she couldn't stand up.  She could do for herself but her son was there and she decided she needed to come in to the hospital.  She has been unable to walk progressively since her last hospitalization.  She went to SNF rehab after her last hospitalization and she was doing ok initially.  She was doing ok and then all at once her left leg starting giving her problems.  She denies back pain.  She doesn't move either leg well but the left is worse.  She is confused about what is going on.  She is very upset that her sons are not here and wants to know why I don't know where they are.  I called and attempted to speak with her sons, but was unable to reach either at the time of admission.    She was previously hospitalized from 8/5-16 for new-onset atrial flutter.  She was started on Coreg and Eliquis.  She also had L ankle pain and was started on treatment for gout.    ED Course: Carryover, per Dr. Velia [name]:  new onset difficulty ambulating on the basis of generalized weakness, with exam revealing no evidence of acute focal neurologic deficit.  MRI lumbar spine showed mild spinal stenosis without any significant cord impingement.  Reportedly no evidence of underlying infectious process.  It is emphasized that this is [name] new process over the last few days, representing [name] significant departure from the patient's baseline ambulatory  status.   Review of Systems: As per HPI; otherwise review of systems reviewed and negative.   Ambulatory Status:  Ambulated with [name] walker last month  COVID Vaccine Status:   Complete plus booster  Past Medical History:  Diagnosis Date   Arthritis    Atrial fibrillation, chronic (HCC)    Diabetes mellitus    Hypertension    Obesity    Pulmonary embolus ([place])     Past Surgical History:  Procedure Laterality Date   ABDOMINAL HYSTERECTOMY     CHOLECYSTECTOMY     JOINT REPLACEMENT  Right    Social History   Socioeconomic History   Marital status: Widowed    Spouse name: Not on file   Number of children: Not on file   Years of education: Not on file   Highest education level: Not on file  Occupational History   Occupation: retired  Tobacco Use   Smoking status: Never   Smokeless tobacco: Never  Vaping Use   Vaping Use: Never used  Substance and Sexual Activity   Alcohol use: No   Drug use: No   Sexual activity: Not Currently  Other Topics Concern   Not on file  Social History Narrative   Not on file   Social Determinants of Health   Financial Resource Strain: Low Risk    Difficulty of Paying Living Expenses: Not hard at all  Food Insecurity: No Food Insecurity   Worried About Charity fundraiser  in the Last Year: Never true   [place] in the Last Year: Never true  Transportation Needs: No Transportation Needs   Lack of Transportation (Medical): No   Lack of Transportation (Non-Medical): No  Physical Activity: Insufficiently Active   Days of Exercise per Week: 3 days   Minutes of Exercise per Session: 30 min  Stress: No Stress Concern Present   Feeling of Stress : Not at all  Social Connections: Unknown   Frequency of Communication with Friends and Family: More than three times [name] week   Frequency of Social Gatherings with Friends and Family: More than three times [name] week   Attends Religious Services: Not on file   Active Member of Clubs or  Organizations: No   Attends Archivist Meetings: Never   Marital Status: Widowed  Human resources officer Violence: Not At Risk   Fear of Current or Ex-Partner: No   Emotionally Abused: No   Physically Abused: No   Sexually Abused: No    No Known Allergies  Family History  Problem Relation Age of Onset   Hypertension Mother     Prior to Admission medications   Medication Sig Start Date End Date Taking? Authorizing Provider  Accu-Chek Softclix Lancets lancets 1 each by Other route daily. Use as instructed 03/30/20  Yes Kayla Lung, MD  acetaminophen (TYLENOL) 650 MG CR tablet Take 650 mg by mouth every 8 (eight) hours as needed for pain.   Yes [provider]  ammonium lactate (LAC-HYDRIN) 12 % lotion Apply 1 application topically as needed for dry skin. 03/31/21  Yes Tysinger, Camelia Eng, PA-C  apixaban (ELIQUIS) 5 MG TABS tablet Take 1 tablet (5 mg total) by mouth 2 (two) times daily. 05/23/21 06/22/21 Yes Kayla Lung, MD  atorvastatin (LIPITOR) 80 MG tablet Take 1 tablet (80 mg total) by mouth daily. Patient taking differently: Take 80 mg by mouth at bedtime. 01/20/20  Yes Kayla Lung, MD  carbamide peroxide (DEBROX) 6.5 % OTIC solution Place 1-2 drops into both ears daily as needed (wax build up).   Yes [provider]  carvedilol (COREG) 6.25 MG tablet Take 1 tablet (6.25 mg total) by mouth 2 (two) times daily. 03/31/21  Yes Chandrasekhar, Mahesh A, MD  diclofenac Sodium (VOLTAREN) 1 % GEL Apply 2 g topically 4 (four) times daily. Rub into affected area of foot 2 to 4 times daily Patient taking differently: Apply 2 g topically 4 (four) times daily as needed (pain). 03/31/21  Yes Tysinger, Camelia Eng, PA-C  ezetimibe (ZETIA) 10 MG tablet Take 1 tablet (10 mg total) by mouth daily. Patient taking differently: Take 10 mg by mouth at bedtime. 05/23/21 06/22/21 Yes Kayla Lung, MD  furosemide (LASIX) 20 MG tablet Take 1 tablet (20 mg total) by mouth daily as  needed (take as needed for swelling). Patient taking differently: Take 20 mg by mouth daily as needed for edema or fluid. 03/31/21  Yes Chandrasekhar, Mahesh A, MD  glucose blood test strip 1 each by Other route as needed for other. Use as instructed Patient taking differently: 1 each by Other route daily. Use as instructed 12/31/20  Yes Kayla Lung, MD  losartan (COZAAR) 50 MG tablet Take 1 tablet (50 mg total) by mouth daily. Patient taking differently: Take 50 mg by mouth at bedtime. 05/23/21 06/22/21 Yes Kayla Lung, MD  metFORMIN (GLUCOPHAGE) 500 MG tablet Take 1 tablet (500 mg total) by mouth 2 (two) times daily with  [name] meal. 12/28/20  Yes Kayla Lung, MD  nitroGLYCERIN (NITROSTAT) 0.4 MG SL tablet Take up to 3 tablets Patient taking differently: 0.4 mg every 5 (five) minutes as needed for chest pain. 03/31/21  Yes Chandrasekhar, Mahesh A, MD  TRIAMCINOLONE ACETONIDE, TOP, 0.05 % OINT Apply 1 application topically 2 (two) times daily as needed (rough skin on heels). 03/31/21  Yes Tysinger, Camelia Eng, PA-C  amitriptyline (ELAVIL) 10 MG tablet Take 1 tablet (10 mg total) by mouth at bedtime. Patient not taking: Reported on 06/22/2021 06/22/21   Kayla Lung, MD    Physical Exam: Vitals:   06/23/21 1100 06/23/21 1200 06/23/21 1300 06/23/21 1319  BP: (!) 115/47 (!) 116/91 (!) 105/59 (!) 105/59  Pulse: 83 89 (!) 104 (!) 106  Resp: (!) 26 (!) 29 (!) 25 (!) 24  Temp:    99.1 F (37.3 C)  TempSrc:    Oral  SpO2: 97% 100% 98% 98%     General:  Appears calm and comfortable and is in NAD Eyes:   EOMI, normal lids, iris ENT:  grossly normal hearing, lips & tongue, mmm; poor dentition Neck:  no LAD, masses or thyromegaly Cardiovascular:  RRR, no m/r/g. 1+ LE edema.  Respiratory:   CTA bilaterally with no wheezes/rales/rhonchi.  Mildly increased respiratory effort. Abdomen:  soft, NT, ND Skin:  no rash or induration seen on limited exam Musculoskeletal:  decreased tone BUE/BLE - maybe  worse on LLE than RLE but difficult to tell, no bony abnormality Lower extremity:  1+ LE edema.  Limited foot exam with no ulcerations.  2+ distal pulses. Psychiatric:  mildly confused mood and affect, speech fluent and appropriate, AOx1-2 Neurologic:  CN 2-12 grossly intact    Radiological Exams on Admission: Independently reviewed - see discussion in [name]/P where applicable  CT Lumbar Spine Wo Contrast  Result Date: 06/22/2021 CLINICAL DATA:  Spinal stenosis, lumbosacral EXAM: CT LUMBAR SPINE WITHOUT CONTRAST TECHNIQUE: Multidetector CT imaging of the lumbar spine was performed without intravenous contrast administration. Multiplanar CT image reconstructions were also generated. COMPARISON:  No prior CT of the lumbar spine. Correlation is made with 11/04/2014 lumbar spine radiographs. FINDINGS: Segmentation: 5 lumbar type vertebrae. Right greater than left lumbosacral assimilation joints. Alignment: Trace anterolisthesis L3 on L4. Grade 1 anterolisthesis L4 on L5. Vertebrae: No acute fracture or suspicious osseous lesion. Vertebral body heights are preserved. Paraspinal and other soft tissues: Aortic atherosclerosis. Otherwise negative. Disc levels: T12-L1: Minimal disc bulge. No spinal canal stenosis or neural foraminal narrowing. L1-L2: Mild disc bulge. Mild facet arthropathy. No spinal canal stenosis. Mild bilateral neural foraminal narrowing. L2-L3: Disc height loss with vacuum disc and disc osteophyte complex with moderate disc bulge. Moderate facet arthropathy. Moderate spinal canal stenosis and mild bilateral neural foraminal narrowing. L3-L4: Mild calcified disc bulge. Moderate facet arthropathy. Ligamentum flavum hypertrophy. Moderate to severe spinal canal stenosis. Moderate bilateral neural foraminal narrowing. L4-L5: Grade 1 anterolisthesis with disc unroofing. Severe facet arthropathy. Ligamentum flavum hypertrophy. Moderate to severe spinal canal stenosis. Moderate left greater than right  neural foraminal narrowing. L5-S1: Broad-based disc bulge. Mild facet arthropathy. No spinal canal stenosis or neural foraminal narrowing. IMPRESSION: 1. L3-L4 and L4-L5 moderate to severe spinal canal stenosis and moderate bilateral neural foraminal narrowing. 2. L2-L3 moderate spinal canal stenosis and mild bilateral neural foraminal narrowing. Electronically Signed   By: Merilyn Baba M.D.   On: 06/22/2021 20:36   MR LUMBAR SPINE WO CONTRAST  Result Date: 06/22/2021 CLINICAL DATA:  Apical see  ambulating, urinary and fecal incontinence, left leg pain EXAM: MRI LUMBAR SPINE WITHOUT CONTRAST TECHNIQUE: Multiplanar, multisequence MR imaging of the lumbar spine was performed. No intravenous contrast was administered. COMPARISON:  No prior MRI of the lumbar spine, correlation is made with CT lumbar spine 06/22/2021. FINDINGS: Segmentation: 5 lumbar type vertebral bodies, with lumbosacral assimilation joints. Alignment: Trace retrolisthesis L1 on L2. Trace anterolisthesis L3 on L4. Grade 1 anterolisthesis L4 on L5. Vertebrae:  No acute fracture or suspicious osseous lesion. Conus medullaris and cauda equina: Conus extends to the L2 level. Conus and cauda equina appear normal. Paraspinal and other soft tissues: Renal cysts. Disc levels: T12-L1: Minimal disc bulge. Mild facet arthropathy. No spinal canal stenosis or neural foraminal narrowing. L1-L2: Trace retrolisthesis with mild disc bulge. Moderate facet arthropathy and ligamentum flavum hypertrophy. Minimal spinal canal stenosis. Mild narrowing of the lateral recesses. No neural foraminal narrowing. L2-L3: Disc height loss with broad-based disc bulge. Severe facet arthropathy and ligamentum flavum hypertrophy. Moderate spinal canal stenosis. Effacement of the lateral recesses. No neural foraminal narrowing. L3-L4: Broad-based disc bulge. Severe facet arthropathy. Ligamentum flavum hypertrophy. Moderate to severe spinal canal stenosis with effacement of the  lateral recesses. Mild-to-moderate left neural foraminal narrowing. L4-L5: Grade 1 anterolisthesis with disc unroofing and broad-based disc bulge. Severe facet arthropathy. Mild spinal canal stenosis. Narrowing of the lateral recesses. Mild left neural foraminal narrowing. L5-S1: No significant disc bulge. Severe facet arthropathy. No spinal canal stenosis or neural foraminal narrowing. IMPRESSION: 1. L3-L4 moderate to severe spinal canal stenosis and mild-to-moderate left neural foraminal narrowing. Effacement of the lateral recesses at this level likely compresses the descending L4 nerves. 2. L2-L3 moderate spinal canal stenosis. Effacement of the lateral recesses at this level likely compresses the descending L3 nerves. 3. L4-L5 mild spinal canal stenosis and mild left neural foraminal narrowing. Narrowing of the lateral recesses at this level could affect the descending L5 nerves. 4. Narrowing of the lateral recesses at L1-L2 could affect the descending L2 nerves. Electronically Signed   By: Merilyn Baba M.D.   On: 06/22/2021 23:59    EKG: not done   Labs on Admission: I have personally reviewed the available labs and imaging studies at the time of the admission.  Pertinent labs:   Glucose 124 BN 21/Creatinine 1.03/GFR 54 Albumin 3.0 WBC 7.1 Hgb 9.7 TSH 1.794 COVID/flu negative UA WNL   Assessment/Plan Principal Problem:   Ambulatory dysfunction Active Problems:   Class 2 obesity due to excess calories with body mass index (BMI) of 37.0 to 37.9 in adult   Hyperlipidemia associated with type 2 diabetes mellitus (HCC)   Primary hypertension   Type 2 diabetes mellitus (HCC)   Atrial flutter (HCC)   Generalized weakness   Spinal stenosis   Generalized weakness/Ambulatory dysfunction -Patient previously admitted with new-onset afib -She was discharged to SNF rehab -She appears to have stabilized while there but has had deterioration while at home -The acuity of that deterioration  is unclear, as the patient is [name] poor historian and her children were not available in person or by telephone -Will observe for now with PT/OT/ST (cognitive evaluation) consults -She appears to have dementia and baseline very sedentary lifestyle, so it would be somewhat surprising if this presentation was acute rather than progressive  Spinal stenosis -Imaging indicates spinal stenosis -It is possible that this is contributing to her presentation -However, she is really having B LE weakness (L > R but that difference is subtle) -Further, I suggested calling neurosurgery to  consult and she reported that she is not [name] surgical candidate and so the consult is not necessary; will not call currently  Afib -Rate control with Coreg -Continue Eliquis for now, but given her very high fall risk would consider stopping at some point in the future -Placed on telemetry by nighttime doc but will change to Med Surg since there does not appear to be an acute cardiac issue at this time  DM -Recent A1c was 6.7, indicating good control -hold Glucophage -Cover with moderate-scale SSI   HTN -Continue Coreg, Cozaar  HLD -Continue Lipitor -Hold Zetia due to limited inpatient utility  Obesity -BMI 37.8 -Weight loss should be encouraged -Outpatient PCP/bariatric medicine f/u encouraged      Note: This patient has been tested and is negative for the novel coronavirus COVID-19. The patient has been fully vaccinated against COVID-19.   Level of care: Telemetry Surgical DVT prophylaxis:  Eliquis Code Status:  Full - confirmed with patient Family Communication: None present; I was unable to speak with either son at the time of admission Disposition Plan:  The patient is from: home  Anticipated d/c is to: be determined  Anticipated d/c date will depend on clinical response to treatment, but possibly as early as tomorrow if she has excellent response to treatment  Patient is currently: acutely  ill Consults called: PT/OT/ST/TOC team  Admission status:  It is my clinical opinion that referral for OBSERVATION is reasonable and necessary in this patient based on the above information provided. The aforementioned taken together are felt to place the patient at high risk for further clinical deterioration. However it is anticipated that the patient may be medically stable for discharge from the hospital within 24 to 48 hours.    Karmen Bongo MD [facility]   How to contact the [facility] Attending or Consulting provider [place] or covering provider during after hours [place], for this patient?  Check the care team in [facility] and look for [name]) attending/consulting TRH provider listed and b) the [facility] team listed Log into [email or web address] and use [place]'s universal password to access. If you do not have the password, please contact the hospital operator. Locate the [facility] provider you are looking for under [facility] and page to [name] number that you can be directly reached. If you still have difficulty reaching the provider, please page the [facility] (Director on Call) for the Hospitalists listed on amion for assistance.   06/23/2021, 2:32 PM

## 2021-06-23 NOTE — Progress Notes (Signed)
Carry-over admit to day-time hospitalist:  Per my discussions with the EDP, Dr. Roxanne Mins, this is an 80 year old female who presents with new onset difficulty ambulating on the basis of generalized weakness, with exam revealing no evidence of acute focal neurologic deficit.  MRI lumbar spine showed mild spinal stenosis without any significant cord impingement.  Reportedly no evidence of underlying infectious process.  It is emphasized that this is a new process over the last few days, representing a significant departure from the patient's baseline ambulatory status.  No place of initial order for observation to MedSurg, and placed.  Preliminary orders via the adult multi morbid order set.      Babs Bertin, DO Hospitalist

## 2021-06-23 NOTE — ED Notes (Signed)
Pt found to have, again, removed gown, pure wick, and monitoring equipment. Pt re-oriented, re-dressed, and attached to cardiac monitor.

## 2021-06-24 DIAGNOSIS — M5416 Radiculopathy, lumbar region: Secondary | ICD-10-CM | POA: Diagnosis not present

## 2021-06-24 DIAGNOSIS — R531 Weakness: Secondary | ICD-10-CM | POA: Diagnosis present

## 2021-06-24 DIAGNOSIS — M79605 Pain in left leg: Secondary | ICD-10-CM

## 2021-06-24 DIAGNOSIS — E1169 Type 2 diabetes mellitus with other specified complication: Secondary | ICD-10-CM | POA: Diagnosis present

## 2021-06-24 DIAGNOSIS — I10 Essential (primary) hypertension: Secondary | ICD-10-CM

## 2021-06-24 DIAGNOSIS — M48061 Spinal stenosis, lumbar region without neurogenic claudication: Secondary | ICD-10-CM | POA: Diagnosis present

## 2021-06-24 DIAGNOSIS — E6609 Other obesity due to excess calories: Secondary | ICD-10-CM | POA: Diagnosis present

## 2021-06-24 DIAGNOSIS — F0390 Unspecified dementia without behavioral disturbance: Secondary | ICD-10-CM | POA: Diagnosis present

## 2021-06-24 DIAGNOSIS — Z7901 Long term (current) use of anticoagulants: Secondary | ICD-10-CM | POA: Diagnosis not present

## 2021-06-24 DIAGNOSIS — D649 Anemia, unspecified: Secondary | ICD-10-CM | POA: Diagnosis present

## 2021-06-24 DIAGNOSIS — Z8249 Family history of ischemic heart disease and other diseases of the circulatory system: Secondary | ICD-10-CM | POA: Diagnosis not present

## 2021-06-24 DIAGNOSIS — I4891 Unspecified atrial fibrillation: Secondary | ICD-10-CM

## 2021-06-24 DIAGNOSIS — R200 Anesthesia of skin: Secondary | ICD-10-CM | POA: Diagnosis present

## 2021-06-24 DIAGNOSIS — E785 Hyperlipidemia, unspecified: Secondary | ICD-10-CM | POA: Diagnosis present

## 2021-06-24 DIAGNOSIS — Z6837 Body mass index (BMI) 37.0-37.9, adult: Secondary | ICD-10-CM | POA: Diagnosis not present

## 2021-06-24 DIAGNOSIS — Z79899 Other long term (current) drug therapy: Secondary | ICD-10-CM | POA: Diagnosis not present

## 2021-06-24 DIAGNOSIS — Z794 Long term (current) use of insulin: Secondary | ICD-10-CM | POA: Diagnosis not present

## 2021-06-24 DIAGNOSIS — R262 Difficulty in walking, not elsewhere classified: Secondary | ICD-10-CM | POA: Diagnosis present

## 2021-06-24 DIAGNOSIS — Z20822 Contact with and (suspected) exposure to covid-19: Secondary | ICD-10-CM | POA: Diagnosis present

## 2021-06-24 DIAGNOSIS — Z28311 Partially vaccinated for covid-19: Secondary | ICD-10-CM | POA: Diagnosis not present

## 2021-06-24 DIAGNOSIS — Z86711 Personal history of pulmonary embolism: Secondary | ICD-10-CM | POA: Diagnosis not present

## 2021-06-24 DIAGNOSIS — I890 Lymphedema, not elsewhere classified: Secondary | ICD-10-CM | POA: Diagnosis present

## 2021-06-24 DIAGNOSIS — M109 Gout, unspecified: Secondary | ICD-10-CM | POA: Diagnosis present

## 2021-06-24 DIAGNOSIS — Z7984 Long term (current) use of oral hypoglycemic drugs: Secondary | ICD-10-CM | POA: Diagnosis not present

## 2021-06-24 DIAGNOSIS — M4316 Spondylolisthesis, lumbar region: Secondary | ICD-10-CM | POA: Diagnosis present

## 2021-06-24 DIAGNOSIS — L89626 Pressure-induced deep tissue damage of left heel: Secondary | ICD-10-CM | POA: Diagnosis present

## 2021-06-24 DIAGNOSIS — I482 Chronic atrial fibrillation, unspecified: Secondary | ICD-10-CM | POA: Diagnosis present

## 2021-06-24 HISTORY — DX: Radiculopathy, lumbar region: M54.16

## 2021-06-24 LAB — CBC WITH DIFFERENTIAL/PLATELET
Abs Immature Granulocytes: 0.01 10*3/uL (ref 0.00–0.07)
Basophils Absolute: 0 10*3/uL (ref 0.0–0.1)
Basophils Relative: 0 %
Eosinophils Absolute: 0.2 10*3/uL (ref 0.0–0.5)
Eosinophils Relative: 3 %
HCT: 27.1 % — ABNORMAL LOW (ref 36.0–46.0)
Hemoglobin: 8.5 g/dL — ABNORMAL LOW (ref 12.0–15.0)
Immature Granulocytes: 0 %
Lymphocytes Relative: 19 %
Lymphs Abs: 1.4 10*3/uL (ref 0.7–4.0)
MCH: 25.8 pg — ABNORMAL LOW (ref 26.0–34.0)
MCHC: 31.4 g/dL (ref 30.0–36.0)
MCV: 82.4 fL (ref 80.0–100.0)
Monocytes Absolute: 0.8 10*3/uL (ref 0.1–1.0)
Monocytes Relative: 11 %
Neutro Abs: 4.7 10*3/uL (ref 1.7–7.7)
Neutrophils Relative %: 67 %
Platelets: 308 10*3/uL (ref 150–400)
RBC: 3.29 MIL/uL — ABNORMAL LOW (ref 3.87–5.11)
RDW: 14.4 % (ref 11.5–15.5)
WBC: 7.1 10*3/uL (ref 4.0–10.5)
nRBC: 0 % (ref 0.0–0.2)

## 2021-06-24 LAB — GLUCOSE, CAPILLARY
Glucose-Capillary: 111 mg/dL — ABNORMAL HIGH (ref 70–99)
Glucose-Capillary: 150 mg/dL — ABNORMAL HIGH (ref 70–99)
Glucose-Capillary: 188 mg/dL — ABNORMAL HIGH (ref 70–99)
Glucose-Capillary: 216 mg/dL — ABNORMAL HIGH (ref 70–99)
Glucose-Capillary: 223 mg/dL — ABNORMAL HIGH (ref 70–99)

## 2021-06-24 LAB — COMPREHENSIVE METABOLIC PANEL
ALT: 10 U/L (ref 0–44)
AST: 15 U/L (ref 15–41)
Albumin: 2.3 g/dL — ABNORMAL LOW (ref 3.5–5.0)
Alkaline Phosphatase: 62 U/L (ref 38–126)
Anion gap: 8 (ref 5–15)
BUN: 16 mg/dL (ref 8–23)
CO2: 25 mmol/L (ref 22–32)
Calcium: 8.3 mg/dL — ABNORMAL LOW (ref 8.9–10.3)
Chloride: 101 mmol/L (ref 98–111)
Creatinine, Ser: 0.88 mg/dL (ref 0.44–1.00)
GFR, Estimated: >60 mL/min (ref >60)
Glucose, Bld: 114 mg/dL — ABNORMAL HIGH (ref 70–99)
Potassium: 4.3 mmol/L (ref 3.5–5.1)
Sodium: 134 mmol/L — ABNORMAL LOW (ref 135–145)
Total Bilirubin: 0.5 mg/dL (ref 0.3–1.2)
Total Protein: 6.2 g/dL — ABNORMAL LOW (ref 6.5–8.1)

## 2021-06-24 LAB — LIPID PANEL
Cholesterol: 113 mg/dL (ref 0–200)
HDL: 32 mg/dL — ABNORMAL LOW (ref >40)
LDL Cholesterol: 70 mg/dL (ref 0–99)
Total CHOL/HDL Ratio: 3.5 RATIO
Triglycerides: 57 mg/dL (ref <150)
VLDL: 11 mg/dL (ref 0–40)

## 2021-06-24 LAB — MAGNESIUM: Magnesium: 1.6 mg/dL — ABNORMAL LOW (ref 1.7–2.4)

## 2021-06-24 LAB — HEMOGLOBIN A1C
Hgb A1c MFr Bld: 6.9 % — ABNORMAL HIGH (ref 4.8–5.6)
Mean Plasma Glucose: 151 mg/dL

## 2021-06-24 LAB — PHOSPHORUS: Phosphorus: 2.9 mg/dL (ref 2.5–4.6)

## 2021-06-24 MED ORDER — COVID-19MRNA BIVAL VACC PFIZER 30 MCG/0.3ML IM SUSP
0.3000 mL | Freq: Once | INTRAMUSCULAR | Status: DC
  Filled 2021-06-24: qty 0.3

## 2021-06-24 MED ORDER — GABAPENTIN 100 MG PO CAPS
100.0000 mg | ORAL_CAPSULE | Freq: Three times a day (TID) | ORAL | Status: DC
  Administered 2021-06-24 – 2021-06-28 (×12): 100 mg via ORAL
  Filled 2021-06-24 (×15): qty 1

## 2021-06-24 MED ORDER — INSULIN ASPART 100 UNIT/ML IJ SOLN
0.0000 [IU] | INTRAMUSCULAR | Status: DC
  Administered 2021-06-24: 7 [IU] via SUBCUTANEOUS
  Administered 2021-06-24: 4 [IU] via SUBCUTANEOUS
  Administered 2021-06-24: 7 [IU] via SUBCUTANEOUS
  Administered 2021-06-25: 4 [IU] via SUBCUTANEOUS
  Administered 2021-06-25: 3 [IU] via SUBCUTANEOUS
  Administered 2021-06-25: 7 [IU] via SUBCUTANEOUS
  Administered 2021-06-25 (×2): 3 [IU] via SUBCUTANEOUS
  Administered 2021-06-25: 4 [IU] via SUBCUTANEOUS
  Administered 2021-06-25: 11 [IU] via SUBCUTANEOUS
  Administered 2021-06-26 (×2): 3 [IU] via SUBCUTANEOUS
  Administered 2021-06-26 (×2): 7 [IU] via SUBCUTANEOUS
  Administered 2021-06-27: 18:00:00 3 [IU] via SUBCUTANEOUS
  Administered 2021-06-27: 20:00:00 7 [IU] via SUBCUTANEOUS
  Administered 2021-06-27 (×2): 3 [IU] via SUBCUTANEOUS
  Administered 2021-06-28: 4 [IU] via SUBCUTANEOUS
  Administered 2021-06-28: 7 [IU] via SUBCUTANEOUS
  Administered 2021-06-28: 11 [IU] via SUBCUTANEOUS

## 2021-06-24 MED ORDER — METHYLPREDNISOLONE SODIUM SUCC 125 MG IJ SOLR
80.0000 mg | INTRAMUSCULAR | Status: DC
  Administered 2021-06-24 – 2021-06-28 (×5): 80 mg via INTRAVENOUS
  Filled 2021-06-24 (×5): qty 2

## 2021-06-24 NOTE — Evaluation (Signed)
Occupational Therapy Evaluation Patient Details Name: Kayla [name] MRN: [id number] DOB: Jul 03, 1941 Today's Date: 06/24/2021   History of Present Illness Pt is 80 y.o. F presenting to ED on 11/23 for progressive LLE weakness. PMH includes arthritis, A fib, DM, and HTN   Clinical Impression   Pt lives alone at baseline, states she has an aide that comes 2x/week for cleaning/household chores and an aide that comes 1x/week to assist with ADLs (bathing). Pt uses RW at baseline. Pt max A +2 for bed mobility, unable to fully sit EOB due to LLE pain with leg lowered. Pt demonstrates good UB strength (4/5) but reports RUE feels weaker than LUE. Pt presenting with decreased activity tolerance, pain, ROM, strength, and balance at this time, will continue to follow acutely to maximize independence with ADLs and functional mobility.     Recommendations for follow up therapy are one component of a multi-disciplinary discharge planning process, led by the attending physician.  Recommendations may be updated based on patient status, additional functional criteria and insurance authorization.   Follow Up Recommendations  Skilled nursing-short term rehab (<3 hours/day)    Assistance Recommended at Discharge Intermittent Supervision/Assistance  Functional Status Assessment  Patient has had a recent decline in their functional status and demonstrates the ability to make significant improvements in function in a reasonable and predictable amount of time.  Equipment Recommendations  Wheelchair (measurements OT);BSC/3in1;Wheelchair cushion (measurements OT)    Recommendations for Other Services PT consult     Precautions / Restrictions Precautions Precautions: Fall Restrictions Weight Bearing Restrictions: No      Mobility Bed Mobility Overal bed mobility: Needs Assistance Bed Mobility: Supine to Sit;Sit to Supine     Supine to sit: Max assist;HOB elevated Sit to supine: Max assist;HOB elevated    General bed mobility comments: would benefit from +2 for supported sitting, unable to come all the way sitting EOB due to LLE pain, max A x2 to bring self up in bed    Transfers Overall transfer level: Needs assistance                        Balance Overall balance assessment: Needs assistance Sitting-balance support: Bilateral upper extremity supported Sitting balance-Leahy Scale: Fair   Postural control: Posterior lean                                 ADL either performed or assessed with clinical judgement   ADL Overall ADL's : Needs assistance/impaired Eating/Feeding: Set up;Bed level;Sitting   Grooming: Oral care;Sitting;Set up;Bed level   Upper Body Bathing: Moderate assistance;Bed level   Lower Body Bathing: Maximal assistance;Bed level   Upper Body Dressing : Sitting;Moderate assistance   Lower Body Dressing: Total assistance;Sitting/lateral leans   Toilet Transfer: Maximal assistance;+2 for physical assistance;BSC/3in1   Toileting- Clothing Manipulation and Hygiene: Maximal assistance;Sitting/lateral lean       Functional mobility during ADLs: Maximal assistance;+2 for physical assistance       Vision Baseline Vision/History: 1 Wears glasses Vision Assessment?: No apparent visual deficits     Perception     Praxis      Pertinent Vitals/Pain Pain Assessment: Faces Pain Score: 7  Breathing: normal Negative Vocalization: none Facial Expression: smiling or inexpressive Body Language: relaxed Consolability: no need to console PAINAD Score: 0 Pain Location: LLE Pain Intervention(s): Limited activity within patient's tolerance;Monitored during session;Repositioned     Hand Dominance  Extremity/Trunk Assessment Upper Extremity Assessment Upper Extremity Assessment: Overall WFL for tasks assessed (reported RUE feeling sore/weaker than LUE)   Lower Extremity Assessment Lower Extremity Assessment: Defer to PT evaluation        Communication Communication Communication: No difficulties   Cognition Arousal/Alertness: Awake/alert Behavior During Therapy: WFL for tasks assessed/performed Overall Cognitive Status: Within Functional Limits for tasks assessed                                       General Comments       Exercises     Shoulder Instructions      Home Living Family/patient expects to be discharged to:: Private residence Living Arrangements: Alone Available Help at Discharge: Other (Comment) (aide comes in 2x/week for household cleaning, personal care aide comes in 1x/ week to help with showering and dressing) Type of Home: House Home Access: Ramped entrance     Home Layout: One level     Bathroom Shower/Tub: Occupational psychologist: Standard     Home Equipment: Advice worker (2 wheels)          Prior Functioning/Environment Prior Level of Function : Needs assist       Physical Assist : ADLs (physical)   ADLs (physical): Bathing   ADLs Comments: pt reports aide coming 1x/ week to assist with bathing        OT Problem List: Decreased strength;Decreased range of motion;Impaired balance (sitting and/or standing);Decreased activity tolerance;Decreased safety awareness;Pain      OT Treatment/Interventions: Self-care/ADL training;DME and/or AE instruction;Therapeutic activities;Therapeutic exercise    OT Goals(Current goals can be found in the care plan section) Acute Rehab OT Goals Patient Stated Goal: reduce pain OT Goal Formulation: With patient Time For Goal Achievement: 07/01/21 Potential to Achieve Goals: Fair  OT Frequency: Min 2X/week   Barriers to D/C: Decreased caregiver support          Co-evaluation              AM-PAC OT "6 Clicks" Daily Activity     Outcome Measure Help from another person eating meals?: None Help from another person taking care of personal grooming?: None Help from another person  toileting, which includes using toliet, bedpan, or urinal?: A Lot Help from another person bathing (including washing, rinsing, drying)?: Total Help from another person to put on and taking off regular upper body clothing?: A Lot Help from another person to put on and taking off regular lower body clothing?: A Lot 6 Click Score: 15   End of Session Nurse Communication: Other (comment) (assist to move pt up in bed)  Activity Tolerance: Patient limited by pain;Patient limited by fatigue Patient left: in bed;with call bell/phone within reach;with bed alarm set;with nursing/sitter in room  OT Visit Diagnosis: Unsteadiness on feet (R26.81);Other abnormalities of gait and mobility (R26.89);Muscle weakness (generalized) (M62.81);Pain                Time: 5093-2671 OT Time Calculation (min): 34 min Charges:  OT General Charges $OT Visit: 1 Visit OT Evaluation $OT Eval Low Complexity: 1 Low OT Treatments $Self Care/Home Management : 8-22 mins  Lynnda Child, OTD, OTR/L Acute Rehab [phone]) [phone] - [place] 06/24/2021, 8:58 AM

## 2021-06-24 NOTE — Progress Notes (Signed)
Attempted to administer Covid vaccine as ordered.  Patient refused vaccine.  Luisa Dago, RN notified Pharmacy and received instructions to store vaccine in [employer].

## 2021-06-24 NOTE — Progress Notes (Signed)
PROGRESS NOTE    Kayla [name]  ZOX:[id number] DOB: 01-Dec-1940 DOA: 06/22/2021 PCP: Denita Lung, MD   Brief Narrative:  Kayla [name] is a 80 y.o. BF PMHx DM; HTN; A-Fib on Eliquis; Dementia, PE  Obesity presenting with ambulatory dysfunction.  She initially came in for aflutter in August.  The pain got so bad that she couldn't stand up.  She could do for herself but her son was there and she decided she needed to come in to the hospital.  She has been unable to walk progressively since her last hospitalization.  She went to SNF rehab after her last hospitalization and she was doing ok initially.  She was doing ok and then all at once her left leg starting giving her problems.  She denies back pain.  She doesn't move either leg well but the left is worse.  She is confused about what is going on.  She is very upset that her sons are not here and wants to know why I don't know where they are.   I called and attempted to speak with her sons, but was unable to reach either at the time of admission.     She was previously hospitalized from 8/5-16 for new-onset atrial flutter.  She was started on Coreg and Eliquis.  She also had L ankle pain and was started on treatment for gout.  Ambulatory Status:  Ambulated with a walker last month   Subjective: A/O x4, states increasing pain with loss of sensation bilateral lower extremity LEFT>>> RIGHT   Assessment & Plan:  Covid vaccination; vaccinated 1/3.  Requests second vaccination.  11/25 ordered Bivalent vaccination-Coreg 6.25 mg  Principal Problem:   Ambulatory dysfunction Active Problems:   Class 2 obesity due to excess calories with body mass index (BMI) of 37.0 to 37.9 in adult   Hyperlipidemia associated with type 2 diabetes mellitus ([place])   Primary hypertension   Type 2 diabetes mellitus (H[facility])   Generalized weakness   Spinal stenosis   Unspecified atrial fibrillation ([place])   Spinal stenosis of lumbar region at multiple levels    Lumbar nerve root impingement  Generalized weakness/Ambulatory dysfunction -Patient previously admitted with new-onset afib -She was discharged to SNF rehab -She appears to have stabilized while there but has had deterioration while at home -The acuity of that deterioration is unclear, as the patient is a poor historian and her children were not available in person or by telephone -Will observe for now with PT/OT/ST (cognitive evaluation) consults -She appears to have dementia and baseline very sedentary lifestyle, so it would be somewhat surprising if this presentation was acute rather than progressive   Spinal stenosis/Nerve Root impingement at multiple levels L-spine -Imaging indicates spinal stenosis -It is possible that this is contributing to her presentation -However, she is really having B LE weakness (L > R but that difference is subtle) -Further, I suggested calling neurosurgery to consult and she reported that she is not a surgical candidate and so the consult is not necessary; will not call currently -Per Dr. Karmen Bongo note on 11/24 Dr. Trenton Gammon neurosurgeon was secure chatted -11/25 discussed case at length with Dr. Trenton Gammon neurosurgeon recommends starting patient on course of high-dose steroids.  Believes surgery might be beneficial to patient however patient states does not want surgery.  Dr. Trenton Gammon stated he is going to contact family and discuss findings with them. - 11/25 Solu-Medrol 80 mg daily -11/25 spoke at length with patient concerning spinal stenosis/nerve root  impingement patient emphatically stated right now she does not want surgery.  She did entertain that she would speak with her sons and think it over and give [place] her answer.   Afib -Rate control with Coreg 6.25 mg BID -Losartan 50 mg daily -Apixaban 5 mg BID -11/25 currently NSR  HTN -See A. Fib  DM type II controlled without complication -Recent P8K was 6.7, indicating good control -hold  Glucophage -11/25 increase resistant SSI   HLD -Lipitor 80 mg daily -Hold Zetia due to limited inpatient utility   Obesity (BMI 37.5 kg/m) -Weight loss should be encouraged -Outpatient PCP/bariatric medicine f/u encouraged     DVT prophylaxis: Apixaban Code Status: Full Family Communication:  Status is: Inpatient    Dispo: The patient is from: Home              Anticipated d/c is to: Home              Anticipated d/c date is: > 3 days              Patient currently is not medically stable to d/c.      Consultants:  Neurosurgery Dr. Trenton Gammon    Procedures/Significant Events:  11/23 CT L-spine W0 contrast  1. L3-L4 and L4-L5 moderate to severe spinal canal stenosis and moderate bilateral neural foraminal narrowing. 2. L2-L3 moderate spinal canal stenosis and mild bilateral neural foraminal narrowing. 11/23 MRI L-spine W0 contrast 1. L3-L4 moderate to severe spinal canal stenosis and mild-to-moderate left neural foraminal narrowing. Effacement of the lateral recesses at this level likely compresses the descending L4 nerves. 2. L2-L3 moderate spinal canal stenosis. Effacement of the lateral recesses at this level likely compresses the descending L3 nerves. 3. L4-L5 mild spinal canal stenosis and mild left neural foraminal narrowing. Narrowing of the lateral recesses at this level could affect the descending L5 nerves. 4. Narrowing of the lateral recesses at L1-L2 could affect the descending L2 nerves.  I have personally reviewed and interpreted all radiology studies and my findings are as above.  VENTILATOR SETTINGS:    Cultures   Antimicrobials:    Devices    LINES / TUBES:      Continuous Infusions:   Objective: Vitals:   06/23/21 2108 06/24/21 0451 06/24/21 0456 06/24/21 0810  BP: (!) 125/47  (!) 176/69 (!) 129/54  Pulse: 96  91 83  Resp: 20  20 17   Temp: 98.2 F (36.8 C)  98.3 F (36.8 C) 98.2 F (36.8 C)  TempSrc: Oral  Oral Oral   SpO2: 100%  100% 96%  Weight:  99.3 kg    Height:        Intake/Output Summary (Last 24 hours) at 06/24/2021 1745 Last data filed at 06/24/2021 0930 Gross per 24 hour  Intake --  Output 1000 ml  Net -1000 ml   Filed Weights   06/23/21 1521 06/24/21 0451  Weight: 99.3 kg 99.3 kg    Examination:  General: A/O x4, No acute respiratory distress Eyes: negative scleral hemorrhage, negative anisocoria, negative icterus ENT: Negative Runny nose, negative gingival bleeding, Neck:  Negative scars, masses, torticollis, lymphadenopathy, JVD Lungs: Clear to auscultation bilaterally without wheezes or crackles Cardiovascular: Regular rate and rhythm without murmur gallop or rub normal S1 and S2 Abdomen: OBESE, negative abdominal pain, nondistended, positive soft, bowel sounds, no rebound, no ascites, no appreciable mass Extremities: No significant cyanosis, clubbing, or edema bilateral lower extremities Skin: Negative rashes, lesions, ulcers Psychiatric:  Negative depression, negative anxiety, negative fatigue,  negative mania  Central nervous system:  Cranial nerves II through XII intact, tongue/uvula midline, all extremities muscle strength 5/5, except RLE strength 4/5, LLE strength 2/5 sensation in L LE decreased to soft touch and pinprick.  negative dysarthria, negative expressive aphasia, negative receptive aphasia.  .     Data Reviewed: Care during the described time interval was provided by me .  I have reviewed this patient's available data, including medical history, events of note, physical examination, and all test results as part of my evaluation.   CBC: Recent Labs  Lab 06/22/21 1712 06/24/21 0926  WBC 7.1 7.1  NEUTROABS 5.1 4.7  HGB 9.7* 8.5*  HCT 30.7* 27.1*  MCV 83.2 82.4  PLT 328 638   Basic Metabolic Panel: Recent Labs  Lab 06/22/21 1712 06/24/21 0926  NA 133* 134*  K 4.5 4.3  CL 98 101  CO2 25 25  GLUCOSE 124* 114*  BUN 21 16  CREATININE 1.03* 0.88   CALCIUM 9.0 8.3*  MG  --  1.6*  PHOS  --  2.9   GFR: Estimated Creatinine Clearance: 56.4 mL/min (by C-G formula based on SCr of 0.88 mg/dL). Liver Function Tests: Recent Labs  Lab 06/22/21 1712 06/24/21 0926  AST 18 15  ALT 11 10  ALKPHOS 87 62  BILITOT 0.8 0.5  PROT 7.9 6.2*  ALBUMIN 3.0* 2.3*   No results for input(s): LIPASE, AMYLASE in the last 168 hours. No results for input(s): AMMONIA in the last 168 hours. Coagulation Profile: No results for input(s): INR, PROTIME in the last 168 hours. Cardiac Enzymes: No results for input(s): CKTOTAL, CKMB, CKMBINDEX, TROPONINI in the last 168 hours. BNP (last 3 results) No results for input(s): PROBNP in the last 8760 hours. HbA1C: No results for input(s): HGBA1C in the last 72 hours. CBG: Recent Labs  Lab 06/23/21 1634 06/23/21 2110 06/24/21 0754 06/24/21 1310 06/24/21 1557  GLUCAP 109* 107* 111* 223* 188*   Lipid Profile: Recent Labs    06/24/21 0926  CHOL 113  HDL 32*  LDLCALC 70  TRIG 57  CHOLHDL 3.5   Thyroid Function Tests: Recent Labs    06/22/21 1830  TSH 1.794   Anemia Panel: No results for input(s): VITAMINB12, FOLATE, FERRITIN, TIBC, IRON, RETICCTPCT in the last 72 hours. Urine analysis:    Component Value Date/Time   COLORURINE YELLOW 06/22/2021 2113   APPEARANCEUR CLEAR 06/22/2021 2113   LABSPEC 1.014 06/22/2021 2113   PHURINE 5.0 06/22/2021 2113   GLUCOSEU NEGATIVE 06/22/2021 2113   HGBUR NEGATIVE 06/22/2021 2113   BILIRUBINUR NEGATIVE 06/22/2021 2113   BILIRUBINUR n 11/27/2016 1129   KETONESUR NEGATIVE 06/22/2021 2113   PROTEINUR NEGATIVE 06/22/2021 2113   UROBILINOGEN negative (A) 11/27/2016 1129   UROBILINOGEN 0.2 12/01/2010 2352   NITRITE NEGATIVE 06/22/2021 2113   LEUKOCYTESUR NEGATIVE 06/22/2021 2113   Sepsis Labs: @LABRCNTIP (procalcitonin:4,lacticidven:4)  ) Recent Results (from the past 240 hour(s))  Resp Panel by RT-PCR (Flu A&B, Covid)     Status: None   Collection  Time: 06/22/21  8:20 PM   Specimen: Nasopharyngeal(NP) swabs in vial transport medium  Result Value Ref Range Status   SARS Coronavirus 2 by RT PCR NEGATIVE NEGATIVE Final    Comment: (NOTE) SARS-CoV-2 target nucleic acids are NOT DETECTED.  The SARS-CoV-2 RNA is generally detectable in upper respiratory specimens during the acute phase of infection. The lowest concentration of SARS-CoV-2 viral copies this assay can detect is 138 copies/mL. A negative result does not preclude SARS-Cov-2 infection  and should not be used as the sole basis for treatment or other patient management decisions. A negative result may occur with  improper specimen collection/handling, submission of specimen other than nasopharyngeal swab, presence of viral mutation(s) within the areas targeted by this assay, and inadequate number of viral copies(<138 copies/mL). A negative result must be combined with clinical observations, patient history, and epidemiological information. The expected result is Negative.  Fact Sheet for Patients:  EntrepreneurPulse.com.au  Fact Sheet for Healthcare Providers:  IncredibleEmployment.be  This test is no t yet approved or cleared by the [place] FDA and  has been authorized for detection and/or diagnosis of SARS-CoV-2 by FDA under an Emergency Use Authorization (EUA). This EUA will remain  in effect (meaning this test can be used) for the duration of the COVID-19 declaration under Section 564(b)(1) of the Act, 21 U.S.C.section 360bbb-3(b)(1), unless the authorization is terminated  or revoked sooner.       Influenza A by PCR NEGATIVE NEGATIVE Final   Influenza B by PCR NEGATIVE NEGATIVE Final    Comment: (NOTE) The Xpert Xpress SARS-CoV-2/FLU/RSV plus assay is intended as an aid in the diagnosis of influenza from Nasopharyngeal swab specimens and should not be used as a sole basis for treatment. Nasal washings and aspirates are  unacceptable for Xpert Xpress SARS-CoV-2/FLU/RSV testing.  Fact Sheet for Patients: EntrepreneurPulse.com.au  Fact Sheet for Healthcare Providers: IncredibleEmployment.be  This test is not yet approved or cleared by the [place] FDA and has been authorized for detection and/or diagnosis of SARS-CoV-2 by FDA under an Emergency Use Authorization (EUA). This EUA will remain in effect (meaning this test can be used) for the duration of the COVID-19 declaration under Section 564(b)(1) of the Act, 21 U.S.C. section 360bbb-3(b)(1), unless the authorization is terminated or revoked.  Performed at [facility], [place] [address] [place], [place] [address]          Radiology Studies: CT Lumbar Spine Wo Contrast  Result Date: 06/22/2021 CLINICAL DATA:  Spinal stenosis, lumbosacral EXAM: CT LUMBAR SPINE WITHOUT CONTRAST TECHNIQUE: Multidetector CT imaging of the lumbar spine was performed without intravenous contrast administration. Multiplanar CT image reconstructions were also generated. COMPARISON:  No prior CT of the lumbar spine. Correlation is made with 11/04/2014 lumbar spine radiographs. FINDINGS: Segmentation: 5 lumbar type vertebrae. Right greater than left lumbosacral assimilation joints. Alignment: Trace anterolisthesis L3 on L4. Grade 1 anterolisthesis L4 on L5. Vertebrae: No acute fracture or suspicious osseous lesion. Vertebral body heights are preserved. Paraspinal and other soft tissues: Aortic atherosclerosis. Otherwise negative. Disc levels: T12-L1: Minimal disc bulge. No spinal canal stenosis or neural foraminal narrowing. L1-L2: Mild disc bulge. Mild facet arthropathy. No spinal canal stenosis. Mild bilateral neural foraminal narrowing. L2-L3: Disc height loss with vacuum disc and disc osteophyte complex with moderate disc bulge. Moderate facet arthropathy. Moderate spinal canal stenosis and mild bilateral neural foraminal narrowing.  L3-L4: Mild calcified disc bulge. Moderate facet arthropathy. Ligamentum flavum hypertrophy. Moderate to severe spinal canal stenosis. Moderate bilateral neural foraminal narrowing. L4-L5: Grade 1 anterolisthesis with disc unroofing. Severe facet arthropathy. Ligamentum flavum hypertrophy. Moderate to severe spinal canal stenosis. Moderate left greater than right neural foraminal narrowing. L5-S1: Broad-based disc bulge. Mild facet arthropathy. No spinal canal stenosis or neural foraminal narrowing. IMPRESSION: 1. L3-L4 and L4-L5 moderate to severe spinal canal stenosis and moderate bilateral neural foraminal narrowing. 2. L2-L3 moderate spinal canal stenosis and mild bilateral neural foraminal narrowing. Electronically Signed   By: Francetta Found.D.  On: 06/22/2021 20:36   MR LUMBAR SPINE WO CONTRAST  Result Date: 06/22/2021 CLINICAL DATA:  Apical see ambulating, urinary and fecal incontinence, left leg pain EXAM: MRI LUMBAR SPINE WITHOUT CONTRAST TECHNIQUE: Multiplanar, multisequence MR imaging of the lumbar spine was performed. No intravenous contrast was administered. COMPARISON:  No prior MRI of the lumbar spine, correlation is made with CT lumbar spine 06/22/2021. FINDINGS: Segmentation: 5 lumbar type vertebral bodies, with lumbosacral assimilation joints. Alignment: Trace retrolisthesis L1 on L2. Trace anterolisthesis L3 on L4. Grade 1 anterolisthesis L4 on L5. Vertebrae:  No acute fracture or suspicious osseous lesion. Conus medullaris and cauda equina: Conus extends to the L2 level. Conus and cauda equina appear normal. Paraspinal and other soft tissues: Renal cysts. Disc levels: T12-L1: Minimal disc bulge. Mild facet arthropathy. No spinal canal stenosis or neural foraminal narrowing. L1-L2: Trace retrolisthesis with mild disc bulge. Moderate facet arthropathy and ligamentum flavum hypertrophy. Minimal spinal canal stenosis. Mild narrowing of the lateral recesses. No neural foraminal narrowing.  L2-L3: Disc height loss with broad-based disc bulge. Severe facet arthropathy and ligamentum flavum hypertrophy. Moderate spinal canal stenosis. Effacement of the lateral recesses. No neural foraminal narrowing. L3-L4: Broad-based disc bulge. Severe facet arthropathy. Ligamentum flavum hypertrophy. Moderate to severe spinal canal stenosis with effacement of the lateral recesses. Mild-to-moderate left neural foraminal narrowing. L4-L5: Grade 1 anterolisthesis with disc unroofing and broad-based disc bulge. Severe facet arthropathy. Mild spinal canal stenosis. Narrowing of the lateral recesses. Mild left neural foraminal narrowing. L5-S1: No significant disc bulge. Severe facet arthropathy. No spinal canal stenosis or neural foraminal narrowing. IMPRESSION: 1. L3-L4 moderate to severe spinal canal stenosis and mild-to-moderate left neural foraminal narrowing. Effacement of the lateral recesses at this level likely compresses the descending L4 nerves. 2. L2-L3 moderate spinal canal stenosis. Effacement of the lateral recesses at this level likely compresses the descending L3 nerves. 3. L4-L5 mild spinal canal stenosis and mild left neural foraminal narrowing. Narrowing of the lateral recesses at this level could affect the descending L5 nerves. 4. Narrowing of the lateral recesses at L1-L2 could affect the descending L2 nerves. Electronically Signed   By: Merilyn Baba M.D.   On: 06/22/2021 23:59        Scheduled Meds:  apixaban  5 mg Oral BID   atorvastatin  80 mg Oral QHS   carvedilol  6.25 mg Oral BID   COVID-19 mRNA bivalent vaccine (Pfizer)  0.3 mL Intramuscular Once   gabapentin  100 mg Oral TID   insulin aspart  0-20 Units Subcutaneous Q4H   losartan  50 mg Oral Daily   methylPREDNISolone (SOLU-MEDROL) injection  80 mg Intravenous Q24H   Continuous Infusions:   LOS: 0 days   The patient is critically ill with multiple organ systems failure and requires high complexity decision making for  assessment and support, frequent evaluation and titration of therapies, application of advanced monitoring technologies and extensive interpretation of multiple databases. Critical Care Time devoted to patient care services described in this note  Time spent: 40 minutes     March Joos, Geraldo Docker, MD [facility]   If 7PM-7AM, please contact night-coverage 06/24/2021, 5:45 PM

## 2021-06-24 NOTE — TOC Initial Note (Addendum)
Transition of Care [facility]) - Initial/Assessment Note    Patient Details  Name: Kayla [name] MRN: [id number] Date of Birth: July 21, 1941  Transition of Care [facility]) CM/SW Contact:    Curlene Labrum, RN Phone Number: 06/24/2021, 9:52 AM  Clinical Narrative:                 CM met with the patient at the bedside to discuss transitions of care.  The patient admitted from home with generalized weakness and difficulty ambulating at the home.  The patient states that her sons, [name] and Coralyn Pear take turns staying with her at the home.  The patient's son requested evaluation of patients generalized weakness and consult pending with neurosurgery per MD note.    The patient was given Medicare observation letter and copy placed at the bedside.  The patient states that she is active with [facility] for RN, PT at the home and has [place] home health aide visits 2 x per week to provide assistance and housekeeping duties.  The patient states that she has a rolling walker and ramp at the home and that home health was recommending hospital bed prior to her admission to the hospital.  PT at bedside for evaluation at this time - CM and MSW will continue to follow the patient for discharge planning and transitions of care needs.  06/24/2021 1321 - PT evaluation is recommending SNF placement.  CM called and spoke with Lexine Baton, CM at [facility], where the patient was last admitted, and the patient received SNF placement from 03/15/2021 until 03/29/2021 and should have available SNF days remaining for SNF placement at this time.  [facility] states that they currently do not have available bed open today but to proceed with SNF placement if the patient/ family are agreeable.  CM and MSW will continue to follow the patient for TOC needs.  Expected Discharge Plan: [place] Barriers to Discharge: Continued Medical Work up   Patient Goals and CMS Choice Patient states  their goals for this hospitalization and ongoing recovery are:: patient wants to be able to walk CMS Medicare.gov Compare Post Acute Care list provided to:: Patient Choice offered to / list presented to : Patient  Expected Discharge Plan and Services Expected Discharge Plan: [place]   Discharge Planning Services: CM Consult   Living arrangements for the past 2 months: [place]:  Jack[facility]ul[facility] Denmark active for home health, [facility] provided by family on Monday and Thursday)        Prior Living Arrangements/Services Living arrangements for the past 2 months: Single Family Home Lives with:: Adult Children (Adult sons live out of town and take turns staying with the patient) Patient language and need for interpreter reviewed:: Yes Do you feel safe going back to the place where you live?: Yes      Need for Family Participation in Patient Care: Yes (Comment) Care giver support system in place?: Yes (comment) Current home services: Home PT, Home RN [facility] HH active with RN, PT, [place] visits 2 x week on Monday and Thursday) Criminal Activity/Legal Involvement Pertinent to Current Situation/Hospitalization: No - Comment as needed  Activities of Daily Living Home Assistive Devices/Equipment: Hearing aid, Walker (specify type) ADL Screening (condition at time of admission) Patient's  cognitive ability adequate to safely complete daily activities?: Yes Is the patient deaf or have difficulty hearing?: Yes (wear hearing aid in left ear) Does the patient have difficulty seeing, even when wearing glasses/contacts?: No Does the patient have difficulty concentrating, remembering, or making decisions?: No Patient able to express need for assistance with ADLs?: Yes Does the patient have difficulty dressing or bathing?: Yes Independently performs ADLs?: No Communication: Independent Dressing (OT): Needs assistance Is this a  change from baseline?: Pre-admission baseline (patient get assist with [facility]) Grooming: Needs assistance Is this a change from baseline?: Pre-admission baseline (gt home health assist) Feeding: Independent Bathing: Needs assistance Is this a change from baseline?: Pre-admission baseline (no. receive home health) Toileting: Needs assistance Is this a change from baseline?: Pre-admission baseline (get home health) In/Out Bed: Needs assistance Is this a change from baseline?: Pre-admission baseline Walks in Home: Needs assistance Is this a change from baseline?: Pre-admission baseline (use a walker at home) Does the patient have difficulty walking or climbing stairs?: Yes Weakness of Legs: Left Weakness of Arms/Hands: Left  Permission Sought/Granted Permission sought to share information with : Case Manager, Family Supports Permission granted to share information with : Yes, Verbal Permission Granted     Permission granted to share info w AGENCY: [place] for RN, PT, [facility] home health aide/housekeeping  Permission granted to share info w Relationship: sons - [name] [phone], Ersilia Brawley - [phone]     Emotional Assessment Appearance:: Appears stated age Attitude/Demeanor/Rapport: Engaged Affect (typically observed): Accepting Orientation: : Oriented to Self, Oriented to Place, Oriented to  Time, Oriented to Situation Alcohol / Substance Use: Not Applicable Psych Involvement: No (comment)  Admission diagnosis:  Lymphedema [I89.0] Spinal stenosis of lumbar region with neurogenic claudication [M48.062] Generalized weakness [R53.1] Ambulatory dysfunction [R26.2] Pain of left lower extremity [M79.605] Patient Active Problem List   Diagnosis Date Noted   Generalized weakness 06/23/2021   Ambulatory dysfunction 06/23/2021   Spinal stenosis 06/23/2021   Atrial flutter ([place]) 03/06/2021   Atrial flutter with rapid ventricular response ([place]) 03/05/2021    AKI (acute kidney injury) ([place]) 03/05/2021   Type 2 diabetes mellitus ([place]) 03/05/2021   Elevated troponin    Anemia due to chronic blood loss 08/30/2020   Constipation 08/30/2020   Feces contents abnormal 08/30/2020   Hemorrhoids 08/30/2020   Proctalgia 08/30/2020   Rectal bleeding 08/30/2020   Lymphedema 12/23/2019   Arthritis 12/18/2018   Fatigue 07/09/2018   Gastroesophageal reflux disease without esophagitis 04/16/2018   Dependent edema 01/01/2017   Anemia of chronic illness 02/15/2015   Multiple falls 02/15/2015   Diabetic neuropathy, type II diabetes mellitus ([place]) 09/09/2012   Hyperlipidemia associated with type 2 diabetes mellitus ([place]) 05/22/2011   Primary hypertension 05/22/2011   Class 2 obesity due to excess calories with body mass index (BMI) of 37.0 to 37.9 in adult 12/14/2010   History of pulmonary embolus (PE) 12/14/2010   PCP:  Denita Lung, MD Pharmacy:   [id number]Phone: [phone]: [address]- [phone] [place]Phone: [phone]: [phone]    Social Determinants of Health (SDOH) Interventions    Readmission Risk Interventions No flowsheet data found.

## 2021-06-24 NOTE — Evaluation (Signed)
Physical Therapy Evaluation Patient Details Name: Kayla [name] MRN: [id number] DOB: 31-Oct-1940 Today's Date: 06/24/2021  History of Present Illness  Pt is 80 y.o. F presenting to ED on 11/23 for progressive LLE weakness. PMH includes arthiritis, A fib, DM, and HTN  Clinical Impression  Pt admitted with above diagnosis. Pt with difficulty with transitions to eOB and transfers.  Pt was able to come to EOB with max assist and scoot to drop arm recliner with mod assist but c/o incr pain in bil LES.   Feel that SNF will be helpful to incr her strength and endurance prior to d/c home. Pt currently with functional limitations due to the deficits listed below (see PT Problem List). Pt will benefit from skilled PT to increase their independence and safety with mobility to allow discharge to the venue listed below.          Recommendations for follow up therapy are one component of a multi-disciplinary discharge planning process, led by the attending physician.  Recommendations may be updated based on patient status, additional functional criteria and insurance authorization.  Follow Up Recommendations Skilled nursing-short term rehab (<3 hours/day)    Assistance Recommended at Discharge Frequent or constant Supervision/Assistance  Functional Status Assessment Patient has had a recent decline in their functional status and demonstrates the ability to make significant improvements in function in a reasonable and predictable amount of time.  Equipment Recommendations  None recommended by PT    Recommendations for Other Services       Precautions / Restrictions Precautions Precautions: Fall Restrictions Weight Bearing Restrictions: No      Mobility  Bed Mobility Overal bed mobility: Needs Assistance Bed Mobility: Supine to Sit;Sit to Supine     Supine to sit: Max assist;HOB elevated Sit to supine: Max assist;HOB elevated   General bed mobility comments: With incr time and assist, pt was  able to sit up.  Needed constant support at upper trunk min to min guard assist to sit EOB. She tends to lean posteriorly.    Transfers Overall transfer level: Needs assistance   Transfers: Bed to chair/wheelchair/BSC            Lateral/Scoot Transfers: Mod assist;From elevated surface General transfer comment: Pt was able to scoot to the relciner from the bed with use of pad to assist. Pt needed assist to block the right LE as it tends to extend and she was able to assist with the scoot to the drop arm recliner.    Ambulation/Gait                  Stairs            Wheelchair Mobility    Modified Rankin (Stroke Patients Only)       Balance Overall balance assessment: Needs assistance Sitting-balance support: Bilateral upper extremity supported Sitting balance-Leahy Scale: Poor Sitting balance - Comments: relies on UE support to sit eOB. Postural control: Posterior lean                                   Pertinent Vitals/Pain Pain Assessment: Faces Faces Pain Scale: Hurts whole lot Facial Expression: facial grimacing Pain Location: LLE and right LE (left hurts worse per pt) Pain Descriptors / Indicators: Aching;Grimacing;Guarding Pain Intervention(s): Limited activity within patient's tolerance;Monitored during session;Repositioned    Home Living Family/patient expects to be discharged to:: Private residence Living Arrangements: Alone Available Help at Discharge:  Other (Comment) (aide comes in 2x/week for household cleaning, personal care aide comes in 1x/ week to help with showering and dressing) Type of Home: House Home Access: Ramped entrance       Home Layout: One level Home Equipment: Advice worker (2 wheels) Additional Comments: Pt has PCA 2x/wk for 4 hrs who assists with household tasks    Prior Function Prior Level of Function : Needs assist       Physical Assist : ADLs (physical);Mobility (physical) Mobility  (physical): Bed mobility;Transfers;Gait;Stairs ADLs (physical): Bathing Mobility Comments: up until pt couldnt stand, was walking with RW by herself in the home ADLs Comments: pt reports aide coming 1x/ week to assist with bathing     Hand Dominance   Dominant Hand: Right    Extremity/Trunk Assessment   Upper Extremity Assessment Upper Extremity Assessment: Defer to OT evaluation    Lower Extremity Assessment Lower Extremity Assessment: RLE deficits/detail;LLE deficits/detail RLE Deficits / Details: grossly 2+/5 LLE Deficits / Details: grossly 3-/5    Cervical / Trunk Assessment Cervical / Trunk Assessment: Kyphotic  Communication   Communication: No difficulties  Cognition Arousal/Alertness: Awake/alert Behavior During Therapy: WFL for tasks assessed/performed Overall Cognitive Status: No family/caregiver present to determine baseline cognitive functioning                                          General Comments      Exercises General Exercises - Lower Extremity Ankle Circles/Pumps: AROM;Both;5 reps;Supine Long Arc Quad: AROM;Both;10 reps;Seated   Assessment/Plan    PT Assessment Patient needs continued PT services  PT Problem List Decreased activity tolerance;Decreased balance;Decreased mobility;Decreased strength;Decreased range of motion;Decreased knowledge of use of DME;Decreased safety awareness;Obesity;Pain       PT Treatment Interventions DME instruction;Gait training;Functional mobility training;Therapeutic activities;Therapeutic exercise;Balance training;Patient/family education;Wheelchair mobility training    PT Goals (Current goals can be found in the Care Plan section)  Acute Rehab PT Goals Patient Stated Goal: to get better PT Goal Formulation: With patient Time For Goal Achievement: 07/08/21 Potential to Achieve Goals: Good    Frequency Min 3X/week   Barriers to discharge Decreased caregiver support      Co-evaluation                AM-PAC PT "6 Clicks" Mobility  Outcome Measure Help needed turning from your back to your side while in a flat bed without using bedrails?: A Lot Help needed moving from lying on your back to sitting on the side of a flat bed without using bedrails?: A Lot Help needed moving to and from a bed to a chair (including a wheelchair)?: A Lot Help needed standing up from a chair using your arms (e.g., wheelchair or bedside chair)?: Total Help needed to walk in hospital room?: Total Help needed climbing 3-5 steps with a railing? : Total 6 Click Score: 9    End of Session Equipment Utilized During Treatment: Gait belt Activity Tolerance: Patient limited by fatigue;Patient limited by pain Patient left: in chair;with chair alarm set;with call bell/phone within reach Nurse Communication: Mobility status;Need for lift equipment (Stedy vs Maximove for back to bed) PT Visit Diagnosis: Muscle weakness (generalized) (M62.81);Pain Pain - Right/Left: Left Pain - part of body: Leg    Time: 6010-9323 PT Time Calculation (min) (ACUTE ONLY): 25 min   Charges:   PT Evaluation $PT Eval Moderate Complexity: 1 Mod PT Treatments $  Therapeutic Activity: 8-22 mins        Rafik Koppel M,PT Acute Rehab Services [phone] (pager)   Alvira Philips 06/24/2021, 12:44 PM

## 2021-06-24 NOTE — Consult Note (Signed)
Reason for Consult:  Weakness Referring Physician:  medicine  Kayla [name] is an 80 y.o. female.  HPI:  80 year old female with chronic bilateral lower extremity edema and chronic immobility.  Patient with increased difficulty with weakness and now has difficulty standing and walking secondary to this weakness.  Mild back pain.  She denies radicular pain although she does have some numbness involving both lower extremities left greater than right.  She denies bowel or bladder dysfunction.  Past Medical History:  Diagnosis Date   Arthritis    Atrial fibrillation, chronic (HCC)    Diabetes mellitus    Hypertension    Obesity    Pulmonary embolus ([facility])     Past Surgical History:  Procedure Laterality Date   ABDOMINAL HYSTERECTOMY     CHOLECYSTECTOMY     JOINT REPLACEMENT  Right    Family History  Problem Relation Age of Onset   Hypertension Mother     Social History:  reports that she has never smoked. She has never used smokeless tobacco. She reports that she does not drink alcohol and does not use drugs.  Allergies: No Known Allergies  Medications: I have reviewed the patient's current medications.  Results for orders placed or performed during the hospital encounter of 06/22/21 (from the past 48 hour(s))  Comprehensive metabolic panel     Status: Abnormal   Collection Time: 06/22/21  5:12 PM  Result Value Ref Range   Sodium 133 (L) 135 - 145 mmol/L   Potassium 4.5 3.5 - 5.1 mmol/L   Chloride 98 98 - 111 mmol/L   CO2 25 22 - 32 mmol/L   Glucose, Bld 124 (H) 70 - 99 mg/dL    Comment: Glucose reference range applies only to samples taken after fasting for at least 8 hours.   BUN 21 8 - 23 mg/dL   Creatinine, Ser 1.03 (H) 0.44 - 1.00 mg/dL   Calcium 9.0 8.9 - 10.3 mg/dL   Total Protein 7.9 6.5 - 8.1 g/dL   Albumin 3.0 (L) 3.5 - 5.0 g/dL   AST 18 15 - 41 U/L   ALT 11 0 - 44 U/L   Alkaline Phosphatase 87 38 - 126 U/L   Total Bilirubin 0.8 0.3 - 1.2 mg/dL   GFR,  Estimated 54 (L) >60 mL/min    Comment: (NOTE) Calculated using the CKD-EPI Creatinine Equation (2021)    Anion gap 10 5 - 15    Comment: Performed at [facility] [address] [place], [place] [address]  CBC with Differential     Status: Abnormal   Collection Time: 06/22/21  5:12 PM  Result Value Ref Range   WBC 7.1 4.0 - 10.5 K/uL   RBC 3.69 (L) 3.87 - 5.11 MIL/uL   Hemoglobin 9.7 (L) 12.0 - 15.0 g/dL   HCT 30.7 (L) 36.0 - 46.0 %   MCV 83.2 80.0 - 100.0 fL   MCH 26.3 26.0 - 34.0 pg   MCHC 31.6 30.0 - 36.0 g/dL   RDW 14.6 11.5 - 15.5 %   Platelets 328 150 - 400 K/uL   nRBC 0.0 0.0 - 0.2 %   Neutrophils Relative % 72 %   Neutro Abs 5.1 1.7 - 7.7 K/uL   Lymphocytes Relative 16 %   Lymphs Abs 1.1 0.7 - 4.0 K/uL   Monocytes Relative 10 %   Monocytes Absolute 0.7 0.1 - 1.0 K/uL   Eosinophils Relative 2 %   Eosinophils Absolute 0.1 0.0 - 0.5 K/uL  Basophils Relative 0 %   Basophils Absolute 0.0 0.0 - 0.1 K/uL   Immature Granulocytes 0 %   Abs Immature Granulocytes 0.01 0.00 - 0.07 K/uL    Comment: Performed at [facility], [place] [address] [place], [place] [address]  TSH     Status: None   Collection Time: 06/22/21  6:30 PM  Result Value Ref Range   TSH 1.794 0.350 - 4.500 uIU/mL    Comment: Performed by a 3rd Generation assay with a functional sensitivity of <=0.01 uIU/mL. Performed at [facility], [place] [address] [place], [place] [address]   Resp Panel by RT-PCR (Flu A&B, Covid)     Status: None   Collection Time: 06/22/21  8:20 PM   Specimen: Nasopharyngeal(NP) swabs in vial transport medium  Result Value Ref Range   SARS Coronavirus 2 by RT PCR NEGATIVE NEGATIVE    Comment: (NOTE) SARS-CoV-2 target nucleic acids are NOT DETECTED.  The SARS-CoV-2 RNA is generally detectable in upper respiratory specimens during the acute phase of infection. The lowest concentration of SARS-CoV-2 viral copies this assay can detect is 138 copies/mL. A negative  result does not preclude SARS-Cov-2 infection and should not be used as the sole basis for treatment or other patient management decisions. A negative result may occur with  improper specimen collection/handling, submission of specimen other than nasopharyngeal swab, presence of viral mutation(s) within the areas targeted by this assay, and inadequate number of viral copies(<138 copies/mL). A negative result must be combined with clinical observations, patient history, and epidemiological information. The expected result is Negative.  Fact Sheet for Patients:  EntrepreneurPulse.com.au  Fact Sheet for Healthcare Providers:  IncredibleEmployment.be  This test is no t yet approved or cleared by the [place] FDA and  has been authorized for detection and/or diagnosis of SARS-CoV-2 by FDA under an Emergency Use Authorization (EUA). This EUA will remain  in effect (meaning this test can be used) for the duration of the COVID-19 declaration under Section 564(b)(1) of the Act, 21 U.S.C.section 360bbb-3(b)(1), unless the authorization is terminated  or revoked sooner.       Influenza A by PCR NEGATIVE NEGATIVE   Influenza B by PCR NEGATIVE NEGATIVE    Comment: (NOTE) The Xpert Xpress SARS-CoV-2/FLU/RSV plus assay is intended as an aid in the diagnosis of influenza from Nasopharyngeal swab specimens and should not be used as a sole basis for treatment. Nasal washings and aspirates are unacceptable for Xpert Xpress SARS-CoV-2/FLU/RSV testing.  Fact Sheet for Patients: EntrepreneurPulse.com.au  Fact Sheet for Healthcare Providers: IncredibleEmployment.be  This test is not yet approved or cleared by the [place] FDA and has been authorized for detection and/or diagnosis of SARS-CoV-2 by FDA under an Emergency Use Authorization (EUA). This EUA will remain in effect (meaning this test can be used) for the  duration of the COVID-19 declaration under Section 564(b)(1) of the Act, 21 U.S.C. section 360bbb-3(b)(1), unless the authorization is terminated or revoked.  Performed at [facility], [place] [address], [place], [place] [address]   Urinalysis, Routine w reflex microscopic     Status: None   Collection Time: 06/22/21  9:13 PM  Result Value Ref Range   Color, Urine YELLOW YELLOW   APPearance CLEAR CLEAR   Specific Gravity, Urine 1.014 1.005 - 1.030   pH 5.0 5.0 - 8.0   Glucose, UA NEGATIVE NEGATIVE mg/dL   Hgb urine dipstick NEGATIVE NEGATIVE   Bilirubin Urine NEGATIVE NEGATIVE   Ketones,  ur NEGATIVE NEGATIVE mg/dL   Protein, ur NEGATIVE NEGATIVE mg/dL   Nitrite NEGATIVE NEGATIVE   Leukocytes,Ua NEGATIVE NEGATIVE    Comment: Performed at [facility] [address] [place] [address] [place], [place] [address]  Glucose, capillary     Status: Abnormal   Collection Time: 06/23/21  4:34 PM  Result Value Ref Range   Glucose-Capillary 109 (H) 70 - 99 mg/dL    Comment: Glucose reference range applies only to samples taken after fasting for at least 8 hours.  Glucose, capillary     Status: Abnormal   Collection Time: 06/23/21  9:10 PM  Result Value Ref Range   Glucose-Capillary 107 (H) 70 - 99 mg/dL    Comment: Glucose reference range applies only to samples taken after fasting for at least 8 hours.  Glucose, capillary     Status: Abnormal   Collection Time: 06/24/21  7:54 AM  Result Value Ref Range   Glucose-Capillary 111 (H) 70 - 99 mg/dL    Comment: Glucose reference range applies only to samples taken after fasting for at least 8 hours.  Comprehensive metabolic panel     Status: Abnormal   Collection Time: 06/24/21  9:26 AM  Result Value Ref Range   Sodium 134 (L) 135 - 145 mmol/L   Potassium 4.3 3.5 - 5.1 mmol/L   Chloride 101 98 - 111 mmol/L   CO2 25 22 - 32 mmol/L   Glucose, Bld 114 (H) 70 - 99 mg/dL    Comment: Glucose reference range applies only to samples taken after fasting  for at least 8 hours.   BUN 16 8 - 23 mg/dL   Creatinine, Ser 0.88 0.44 - 1.00 mg/dL   Calcium 8.3 (L) 8.9 - 10.3 mg/dL   Total Protein 6.2 (L) 6.5 - 8.1 g/dL   Albumin 2.3 (L) 3.5 - 5.0 g/dL   AST 15 15 - 41 U/L   ALT 10 0 - 44 U/L   Alkaline Phosphatase 62 38 - 126 U/L   Total Bilirubin 0.5 0.3 - 1.2 mg/dL   GFR, Estimated >60 >60 mL/min    Comment: (NOTE) Calculated using the CKD-EPI Creatinine Equation (2021)    Anion gap 8 5 - 15    Comment: Performed at [facility], [place] [address] [place], [place] [address]  Magnesium     Status: Abnormal   Collection Time: 06/24/21  9:26 AM  Result Value Ref Range   Magnesium 1.6 (L) 1.7 - 2.4 mg/dL    Comment: Performed at [facility] [address], [place], [place] [address]  Phosphorus     Status: None   Collection Time: 06/24/21  9:26 AM  Result Value Ref Range   Phosphorus 2.9 2.5 - 4.6 mg/dL    Comment: Performed at [facility] [address] [place] [address]  CBC with Differential/Platelet     Status: Abnormal   Collection Time: 06/24/21  9:26 AM  Result Value Ref Range   WBC 7.1 4.0 - 10.5 K/uL   RBC 3.29 (L) 3.87 - 5.11 MIL/uL   Hemoglobin 8.5 (L) 12.0 - 15.0 g/dL   HCT 27.1 (L) 36.0 - 46.0 %   MCV 82.4 80.0 - 100.0 fL   MCH 25.8 (L) 26.0 - 34.0 pg   MCHC 31.4 30.0 - 36.0 g/dL   RDW 14.4 11.5 - 15.5 %   Platelets 308 150 - 400 K/uL   nRBC 0.0 0.0 - 0.2 %  Neutrophils Relative % 67 %   Neutro Abs 4.7 1.7 - 7.7 K/uL   Lymphocytes Relative 19 %   Lymphs Abs 1.4 0.7 - 4.0 K/uL   Monocytes Relative 11 %   Monocytes Absolute 0.8 0.1 - 1.0 K/uL   Eosinophils Relative 3 %   Eosinophils Absolute 0.2 0.0 - 0.5 K/uL   Basophils Relative 0 %   Basophils Absolute 0.0 0.0 - 0.1 K/uL   Immature Granulocytes 0 %   Abs Immature Granulocytes 0.01 0.00 - 0.07 K/uL    Comment: Performed at [facility] [address], [place], [place] [address]  Lipid panel     Status: Abnormal   Collection  Time: 06/24/21  9:26 AM  Result Value Ref Range   Cholesterol 113 0 - 200 mg/dL   Triglycerides 57 <150 mg/dL   HDL 32 (L) >40 mg/dL   Total CHOL/HDL Ratio 3.5 RATIO   VLDL 11 0 - 40 mg/dL   LDL Cholesterol 70 0 - 99 mg/dL    Comment:        Total Cholesterol/HDL:CHD Risk Coronary Heart Disease Risk Table                     Men   Women  1/2 Average Risk   3.4   3.3  Average Risk       5.0   4.4  2 X Average Risk   9.6   7.1  3 X Average Risk  23.4   11.0        Use the calculated Patient Ratio above and the CHD Risk Table to determine the patient's CHD Risk.        ATP III CLASSIFICATION (LDL):  <100     mg/dL   Optimal  100-129  mg/dL   Near or Above                    Optimal  130-159  mg/dL   Borderline  160-189  mg/dL   High  >190     mg/dL   Very High Performed at [facility] [address] [place], [place] [address]     CT Lumbar Spine Wo Contrast  Result Date: 06/22/2021 CLINICAL DATA:  Spinal stenosis, lumbosacral EXAM: CT LUMBAR SPINE WITHOUT CONTRAST TECHNIQUE: Multidetector CT imaging of the lumbar spine was performed without intravenous contrast administration. Multiplanar CT image reconstructions were also generated. COMPARISON:  No prior CT of the lumbar spine. Correlation is made with 11/04/2014 lumbar spine radiographs. FINDINGS: Segmentation: 5 lumbar type vertebrae. Right greater than left lumbosacral assimilation joints. Alignment: Trace anterolisthesis L3 on L4. Grade 1 anterolisthesis L4 on L5. Vertebrae: No acute fracture or suspicious osseous lesion. Vertebral body heights are preserved. Paraspinal and other soft tissues: Aortic atherosclerosis. Otherwise negative. Disc levels: T12-L1: Minimal disc bulge. No spinal canal stenosis or neural foraminal narrowing. L1-L2: Mild disc bulge. Mild facet arthropathy. No spinal canal stenosis. Mild bilateral neural foraminal narrowing. L2-L3: Disc height loss with vacuum disc and disc osteophyte complex with  moderate disc bulge. Moderate facet arthropathy. Moderate spinal canal stenosis and mild bilateral neural foraminal narrowing. L3-L4: Mild calcified disc bulge. Moderate facet arthropathy. Ligamentum flavum hypertrophy. Moderate to severe spinal canal stenosis. Moderate bilateral neural foraminal narrowing. L4-L5: Grade 1 anterolisthesis with disc unroofing. Severe facet arthropathy. Ligamentum flavum hypertrophy. Moderate to severe spinal canal stenosis. Moderate left greater than right neural foraminal narrowing. L5-S1: Broad-based disc bulge. Mild facet arthropathy. No  spinal canal stenosis or neural foraminal narrowing. IMPRESSION: 1. L3-L4 and L4-L5 moderate to severe spinal canal stenosis and moderate bilateral neural foraminal narrowing. 2. L2-L3 moderate spinal canal stenosis and mild bilateral neural foraminal narrowing. Electronically Signed   By: Merilyn Baba M.D.   On: 06/22/2021 20:36   MR LUMBAR SPINE WO CONTRAST  Result Date: 06/22/2021 CLINICAL DATA:  Apical see ambulating, urinary and fecal incontinence, left leg pain EXAM: MRI LUMBAR SPINE WITHOUT CONTRAST TECHNIQUE: Multiplanar, multisequence MR imaging of the lumbar spine was performed. No intravenous contrast was administered. COMPARISON:  No prior MRI of the lumbar spine, correlation is made with CT lumbar spine 06/22/2021. FINDINGS: Segmentation: 5 lumbar type vertebral bodies, with lumbosacral assimilation joints. Alignment: Trace retrolisthesis L1 on L2. Trace anterolisthesis L3 on L4. Grade 1 anterolisthesis L4 on L5. Vertebrae:  No acute fracture or suspicious osseous lesion. Conus medullaris and cauda equina: Conus extends to the L2 level. Conus and cauda equina appear normal. Paraspinal and other soft tissues: Renal cysts. Disc levels: T12-L1: Minimal disc bulge. Mild facet arthropathy. No spinal canal stenosis or neural foraminal narrowing. L1-L2: Trace retrolisthesis with mild disc bulge. Moderate facet arthropathy and ligamentum  flavum hypertrophy. Minimal spinal canal stenosis. Mild narrowing of the lateral recesses. No neural foraminal narrowing. L2-L3: Disc height loss with broad-based disc bulge. Severe facet arthropathy and ligamentum flavum hypertrophy. Moderate spinal canal stenosis. Effacement of the lateral recesses. No neural foraminal narrowing. L3-L4: Broad-based disc bulge. Severe facet arthropathy. Ligamentum flavum hypertrophy. Moderate to severe spinal canal stenosis with effacement of the lateral recesses. Mild-to-moderate left neural foraminal narrowing. L4-L5: Grade 1 anterolisthesis with disc unroofing and broad-based disc bulge. Severe facet arthropathy. Mild spinal canal stenosis. Narrowing of the lateral recesses. Mild left neural foraminal narrowing. L5-S1: No significant disc bulge. Severe facet arthropathy. No spinal canal stenosis or neural foraminal narrowing. IMPRESSION: 1. L3-L4 moderate to severe spinal canal stenosis and mild-to-moderate left neural foraminal narrowing. Effacement of the lateral recesses at this level likely compresses the descending L4 nerves. 2. L2-L3 moderate spinal canal stenosis. Effacement of the lateral recesses at this level likely compresses the descending L3 nerves. 3. L4-L5 mild spinal canal stenosis and mild left neural foraminal narrowing. Narrowing of the lateral recesses at this level could affect the descending L5 nerves. 4. Narrowing of the lateral recesses at L1-L2 could affect the descending L2 nerves. Electronically Signed   By: Merilyn Baba M.D.   On: 06/22/2021 23:59    Pertinent items noted in HPI and remainder of comprehensive ROS otherwise negative. Blood pressure (!) 129/54, pulse 83, temperature 98.2 F (36.8 C), temperature source Oral, resp. rate 17, height 5\' 4"  (1.626 m), weight 99.3 kg, SpO2 96 %.   The patient is an elderly female who is significantly overweight.  She has severe bilateral lower extremity edema.  She appears very deconditioned.  She is  awake and alert.  She is oriented and appropriate.  Motor examination limited secondary to effort.  She appears to have good hip flexion bilaterally.  She has diminished strength in her left quadriceps muscle group.  She has good dorsiflexion and plantar flexion bilaterally.  Reflexes are hypoactive.  She has diminished sensory examination in her left lower extremity distal to L3.  Assessment/Plan:   Patient with significant spinal stenosis at L2-3 and most severely at L3-4.  She has an early grade 1 degenerative spondylolisthesis at L4-5 with moderate stenosis.  Overall I think she is mostly symptomatic secondary to her stenosis at  L3-4.  I have discussed possible treatment options for.  The patient is reasonably in fat ache that she does not wish any type of surgical intervention.  Given her overall functional state I think this is appropriate although I do not see a way forward in terms of improving her mobility issues however.  I think we can try IV steroids to see if this lessened some of her symptoms and improved her mobility level.  I will continue to follow.  I will discuss things further with her family.  Mallie Mussel A Taviana Westergren 06/24/2021, 11:41 AM

## 2021-06-24 NOTE — Care Management Obs Status (Cosign Needed)
[facility] NOTIFICATION   Patient Details  Name: Kayla [name] MRN: [id number] Date of Birth: 08/07/1940   Medicare Observation Status Notification Given:  Yes    Curlene Labrum, RN 06/24/2021, 9:26 AM

## 2021-06-24 NOTE — Plan of Care (Signed)
  Problem: Education: Goal: Knowledge of General Education information will improve Description: Including pain rating scale, medication(s)/side effects and non-pharmacologic comfort measures Outcome: Not Progressing   Problem: Health Behavior/Discharge Planning: Goal: Ability to manage health-related needs will improve Outcome: Not Progressing   Problem: Clinical Measurements: Goal: Ability to maintain clinical measurements within normal limits will improve Outcome: Not Progressing Goal: Will remain free from infection Outcome: Not Progressing Goal: Diagnostic test results will improve Outcome: Not Progressing Goal: Respiratory complications will improve Outcome: Not Progressing Goal: Cardiovascular complication will be avoided Outcome: Not Progressing   Problem: Activity: Goal: Risk for activity intolerance will decrease Outcome: Not Progressing   Problem: Nutrition: Goal: Adequate nutrition will be maintained Outcome: Not Progressing   Problem: Coping: Goal: Level of anxiety will decrease Outcome: Not Progressing   Problem: Pain Managment: Goal: General experience of comfort will improve Outcome: Not Progressing   Problem: Safety: Goal: Ability to remain free from injury will improve Outcome: Not Progressing   Problem: Skin Integrity: Goal: Risk for impaired skin integrity will decrease Outcome: Not Progressing   

## 2021-06-24 NOTE — Evaluation (Signed)
Speech Language Pathology Evaluation Patient Details Name: Kayla [name] MRN: [id number] DOB: 03-28-1941 Today's Date: 06/24/2021 Time: 3845-3646 SLP Time Calculation (min) (ACUTE ONLY): 21 min  Problem List:  Patient Active Problem List   Diagnosis Date Noted   Generalized weakness 06/23/2021   Ambulatory dysfunction 06/23/2021   Spinal stenosis 06/23/2021   Atrial flutter ([place]) 03/06/2021   Atrial flutter with rapid ventricular response ([place]) 03/05/2021   AKI (acute kidney injury) ([place]) 03/05/2021   Type 2 diabetes mellitus ([place]) 03/05/2021   Elevated troponin    Anemia due to chronic blood loss 08/30/2020   Constipation 08/30/2020   Feces contents abnormal 08/30/2020   Hemorrhoids 08/30/2020   Proctalgia 08/30/2020   Rectal bleeding 08/30/2020   Lymphedema 12/23/2019   Arthritis 12/18/2018   Fatigue 07/09/2018   Gastroesophageal reflux disease without esophagitis 04/16/2018   Dependent edema 01/01/2017   Anemia of chronic illness 02/15/2015   Multiple falls 02/15/2015   Diabetic neuropathy, type II diabetes mellitus ([place]) 09/09/2012   Hyperlipidemia associated with type 2 diabetes mellitus ([place]) 05/22/2011   Primary hypertension 05/22/2011   Class 2 obesity due to excess calories with body mass index (BMI) of 37.0 to 37.9 in adult 12/14/2010   History of pulmonary embolus ([facility]) 12/14/2010   Past Medical History:  Past Medical History:  Diagnosis Date   Arthritis    Atrial fibrillation, chronic ([place])    Diabetes mellitus    Hypertension    Obesity    Pulmonary embolus ([place])    Past Surgical History:  Past Surgical History:  Procedure Laterality Date   ABDOMINAL HYSTERECTOMY     CHOLECYSTECTOMY     JOINT REPLACEMENT  Right   HPI:  Pt is an 80 y.o. female who presented to the ED on 11/23 with c/o progressive LLE weakness. MRI lumbar spine showed mild spinal stenosis without any significant cord impingement. Neurosurgery has been consulted. PMH includes  arthritis, A fib, DM, and HTN.   Assessment / Plan / Recommendation Clinical Impression  Pt participated in speech/language/cognition evaluation. Pt reported that her cognition has improved significantly since admission and is at, or very close to, baseline. Pt reported that she often has "mishaps" during conversation and at home when she forgets things. She stated that her sons manage her medications and finances, but that she lives alone. Pt's speech and language skills were WFL. Cognitive-linguistic assessment revealed impairments in memory, selective attention, and executive function. Pt's family was contacted to assess pt's baseline, but could not be reached, and pt's cognition was noted to be [facility] during more functional tasks when assessed by OT. Acute SLP services will be discontinued at this time, but may be initiated at the next venue of care if clinically indicated at time of discharge.    SLP Assessment  SLP Recommendation/Assessment: All further Speech Lanaguage Pathology  needs can be addressed in the next venue of care SLP Visit Diagnosis: Cognitive communication deficit (R41.841)    Recommendations for follow up therapy are one component of a multi-disciplinary discharge planning process, led by the attending physician.  Recommendations may be updated based on patient status, additional functional criteria and insurance authorization.    Follow Up Recommendations       Assistance Recommended at Discharge  None  Functional Status Assessment    Frequency and Duration           SLP Evaluation Cognition  Overall Cognitive Status: No family/caregiver present to determine baseline cognitive functioning Arousal/Alertness: Awake/alert Orientation Level: Oriented X4 Year:  2022 Month: November Day of Week: Correct Attention: Focused;Sustained;Selective Focused Attention: Appears intact Sustained Attention: Appears intact Selective Attention: Impaired Selective Attention  Impairment: Verbal complex Memory: Impaired Memory Impairment: Retrieval deficit;Decreased recall of new information (Immediate: 5/5 with repetition; delayed: 3/5) Awareness: Appears intact Problem Solving: Appears intact       Comprehension  Auditory Comprehension Overall Auditory Comprehension: Appears within functional limits for tasks assessed Yes/No Questions: Within Functional Limits (SImple & Complex:: 5/5) Commands:  (2-step: 4/4) Conversation: Complex    Expression Expression Primary Mode of Expression: Verbal Verbal Expression Overall Verbal Expression: Appears within functional limits for tasks assessed Initiation: No impairment Automatic Speech: Counting;Day of week;Month of year Level of Generative/Spontaneous Verbalization: Conversation Repetition: No impairment Naming: No impairment Written Expression Dominant Hand: Right   Oral / Motor  Oral Motor/Sensory Function Overall Oral Motor/Sensory Function: Within functional limits Motor Speech Overall Motor Speech: Appears within functional limits for tasks assessed Respiration: Within functional limits Phonation: Normal Resonance: Within functional limits Articulation: Within functional limitis Intelligibility: Intelligible Motor Planning: Witnin functional limits Motor Speech Errors: Not applicable   Mia Winthrop I. Hardin Negus, [place], [place] Office number [phone] Pager [place] 06/24/2021, 11:35 AM

## 2021-06-24 NOTE — Plan of Care (Signed)
  Problem: Education: Goal: Knowledge of General Education information will improve Description: Including pain rating scale, medication(s)/side effects and non-pharmacologic comfort measures Outcome: Progressing   Problem: Health Behavior/Discharge Planning: Goal: Ability to manage health-related needs will improve Outcome: Progressing   Problem: Clinical Measurements: Goal: Ability to maintain clinical measurements within normal limits will improve Outcome: Progressing Goal: Diagnostic test results will improve Outcome: Progressing   Problem: Nutrition: Goal: Adequate nutrition will be maintained Outcome: Progressing   Problem: Coping: Goal: Level of anxiety will decrease Outcome: Progressing

## 2021-06-25 DIAGNOSIS — M48062 Spinal stenosis, lumbar region with neurogenic claudication: Secondary | ICD-10-CM

## 2021-06-25 DIAGNOSIS — R531 Weakness: Secondary | ICD-10-CM | POA: Diagnosis not present

## 2021-06-25 DIAGNOSIS — E1169 Type 2 diabetes mellitus with other specified complication: Secondary | ICD-10-CM | POA: Diagnosis not present

## 2021-06-25 DIAGNOSIS — R262 Difficulty in walking, not elsewhere classified: Secondary | ICD-10-CM | POA: Diagnosis not present

## 2021-06-25 LAB — CBC WITH DIFFERENTIAL/PLATELET
Abs Immature Granulocytes: 0.05 10*3/uL (ref 0.00–0.07)
Basophils Absolute: 0 10*3/uL (ref 0.0–0.1)
Basophils Relative: 0 %
Eosinophils Absolute: 0 10*3/uL (ref 0.0–0.5)
Eosinophils Relative: 0 %
HCT: 24.6 % — ABNORMAL LOW (ref 36.0–46.0)
Hemoglobin: 7.9 g/dL — ABNORMAL LOW (ref 12.0–15.0)
Immature Granulocytes: 1 %
Lymphocytes Relative: 10 %
Lymphs Abs: 0.7 10*3/uL (ref 0.7–4.0)
MCH: 26 pg (ref 26.0–34.0)
MCHC: 32.1 g/dL (ref 30.0–36.0)
MCV: 80.9 fL (ref 80.0–100.0)
Monocytes Absolute: 0.1 10*3/uL (ref 0.1–1.0)
Monocytes Relative: 2 %
Neutro Abs: 6.2 10*3/uL (ref 1.7–7.7)
Neutrophils Relative %: 87 %
Platelets: 314 10*3/uL (ref 150–400)
RBC: 3.04 MIL/uL — ABNORMAL LOW (ref 3.87–5.11)
RDW: 14.3 % (ref 11.5–15.5)
WBC: 7.1 10*3/uL (ref 4.0–10.5)
nRBC: 0 % (ref 0.0–0.2)

## 2021-06-25 LAB — COMPREHENSIVE METABOLIC PANEL
ALT: 14 U/L (ref 0–44)
AST: 14 U/L — ABNORMAL LOW (ref 15–41)
Albumin: 2.2 g/dL — ABNORMAL LOW (ref 3.5–5.0)
Alkaline Phosphatase: 73 U/L (ref 38–126)
Anion gap: 6 (ref 5–15)
BUN: 23 mg/dL (ref 8–23)
CO2: 26 mmol/L (ref 22–32)
Calcium: 8.3 mg/dL — ABNORMAL LOW (ref 8.9–10.3)
Chloride: 102 mmol/L (ref 98–111)
Creatinine, Ser: 0.96 mg/dL (ref 0.44–1.00)
GFR, Estimated: 59 mL/min — ABNORMAL LOW (ref >60)
Glucose, Bld: 141 mg/dL — ABNORMAL HIGH (ref 70–99)
Potassium: 4.4 mmol/L (ref 3.5–5.1)
Sodium: 134 mmol/L — ABNORMAL LOW (ref 135–145)
Total Bilirubin: 0.5 mg/dL (ref 0.3–1.2)
Total Protein: 6.3 g/dL — ABNORMAL LOW (ref 6.5–8.1)

## 2021-06-25 LAB — GLUCOSE, CAPILLARY
Glucose-Capillary: 144 mg/dL — ABNORMAL HIGH (ref 70–99)
Glucose-Capillary: 138 mg/dL — ABNORMAL HIGH (ref 70–99)
Glucose-Capillary: 189 mg/dL — ABNORMAL HIGH (ref 70–99)
Glucose-Capillary: 175 mg/dL — ABNORMAL HIGH (ref 70–99)
Glucose-Capillary: 281 mg/dL — ABNORMAL HIGH (ref 70–99)
Glucose-Capillary: 217 mg/dL — ABNORMAL HIGH (ref 70–99)

## 2021-06-25 LAB — PHOSPHORUS: Phosphorus: 2.7 mg/dL (ref 2.5–4.6)

## 2021-06-25 LAB — MAGNESIUM: Magnesium: 1.9 mg/dL (ref 1.7–2.4)

## 2021-06-25 MED ORDER — ADULT MULTIVITAMIN W/MINERALS CH
1.0000 | ORAL_TABLET | Freq: Every day | ORAL | Status: DC
  Administered 2021-06-25 – 2021-06-30 (×6): 1 via ORAL
  Filled 2021-06-25 (×6): qty 1

## 2021-06-25 MED ORDER — GLUCERNA SHAKE PO LIQD
237.0000 mL | Freq: Two times a day (BID) | ORAL | Status: DC
  Administered 2021-06-25 – 2021-06-26 (×3): 237 mL via ORAL

## 2021-06-25 MED ORDER — COVID-19MRNA BIVAL VACC PFIZER 30 MCG/0.3ML IM SUSP
0.3000 mL | Freq: Once | INTRAMUSCULAR | Status: DC
  Filled 2021-06-25: qty 0.3

## 2021-06-25 NOTE — NC FL2 (Signed)
[place] LEVEL OF CARE SCREENING TOOL     IDENTIFICATION  Patient Name: Kayla [name] Birthdate: 05-04-1941 Sex: female Admission Date (Current Location): 06/22/2021  [facility] and [place] Number:  Herbalist and Address:  [facility], [place] [address], [place], [place] [address]      Provider Number:    Attending Physician Name and Address:  Allie Bossier, MD  Relative Name and Phone Number:  Devonna Oboyle [phone]    Current Level of Care: Hospital Recommended Level of Care: [place] Prior Approval Number:    Date Approved/Denied:   PASRR Number: [id number]  Discharge Plan: SNF    Current Diagnoses: Patient Active Problem List   Diagnosis Date Noted   Unspecified atrial fibrillation ([place]) 06/24/2021   Spinal stenosis of lumbar region at multiple levels 06/24/2021   Lumbar nerve root impingement 06/24/2021   Generalized weakness 06/23/2021   Ambulatory dysfunction 06/23/2021   Spinal stenosis 06/23/2021   Atrial flutter ([place]) 03/06/2021   Atrial flutter with rapid ventricular response ([place]) 03/05/2021   AKI (acute kidney injury) ([place]) 03/05/2021   Type 2 diabetes mellitus ([place]) 03/05/2021   Elevated troponin    Anemia due to chronic blood loss 08/30/2020   Constipation 08/30/2020   Feces contents abnormal 08/30/2020   Hemorrhoids 08/30/2020   Proctalgia 08/30/2020   Rectal bleeding 08/30/2020   Lymphedema 12/23/2019   Arthritis 12/18/2018   Fatigue 07/09/2018   Gastroesophageal reflux disease without esophagitis 04/16/2018   Dependent edema 01/01/2017   Anemia of chronic illness 02/15/2015   Multiple falls 02/15/2015   Diabetic neuropathy, type II diabetes mellitus ([place]) 09/09/2012   Hyperlipidemia associated with type 2 diabetes mellitus ([place]) 05/22/2011   Primary hypertension 05/22/2011   Class 2 obesity due to excess calories with body mass index (BMI) of 37.0 to 37.9 in adult  12/14/2010   History of pulmonary embolus (PE) 12/14/2010    Orientation RESPIRATION BLADDER Height & Weight     Self, Situation, Place, Time  Normal Incontinent Weight: 224 lb 6.9 oz (101.8 kg) Height:  5\' 4"  (162.6 cm)  BEHAVIORAL SYMPTOMS/MOOD NEUROLOGICAL BOWEL NUTRITION STATUS      Continent Diet  AMBULATORY STATUS COMMUNICATION OF NEEDS Skin   Extensive Assist Verbally Normal                       Personal Care Assistance Level of Assistance  Bathing, Feeding, Dressing Bathing Assistance: Maximum assistance Feeding assistance: Independent Dressing Assistance: Maximum assistance     Functional Limitations Info  Sight, Hearing, Speech Sight Info: Impaired (wears glasses) Hearing Info: Adequate Speech Info: Adequate    SPECIAL CARE FACTORS FREQUENCY  PT (By licensed PT), OT (By licensed OT)     PT Frequency: 5X per week OT Frequency: 5x per week            Contractures Contractures Info: Not present    Additional Factors Info  Code Status Code Status Info: full code             Current Medications (06/25/2021):  This is the current hospital active medication list Current Facility-Administered Medications  Medication Dose Route Frequency Provider Last Rate Last Admin   acetaminophen (TYLENOL) tablet 650 mg  650 mg Oral Q6H PRN Howerter, Justin B, DO   650 mg at 06/24/21 1123   Or   acetaminophen (TYLENOL) suppository 650 mg  650 mg Rectal Q6H PRN Howerter, Justin B, DO  apixaban (ELIQUIS) tablet 5 mg  5 mg Oral BID Regan Lemming, MD   5 mg at 06/25/21 0847   atorvastatin (LIPITOR) tablet 80 mg  80 mg Oral Ivery Quale, MD   80 mg at 06/24/21 2309   carvedilol (COREG) tablet 6.25 mg  6.25 mg Oral BID Karmen Bongo, MD   6.25 mg at 06/25/21 0847   COVID-19 mRNA bivalent vaccine (Pfizer) injection 0.3 mL  0.3 mL Intramuscular Once Allie Bossier, MD       feeding supplement (GLUCERNA SHAKE) (GLUCERNA SHAKE) liquid 237 mL  237 mL Oral BID  BM Allie Bossier, MD       gabapentin (NEURONTIN) capsule 100 mg  100 mg Oral TID Earnie Larsson, MD   100 mg at 06/25/21 0848   insulin aspart (novoLOG) injection 0-20 Units  0-20 Units Subcutaneous Q4H Allie Bossier, MD   3 Units at 06/25/21 0839   losartan (COZAAR) tablet 50 mg  50 mg Oral Daily Regan Lemming, MD   50 mg at 06/25/21 0847   methylPREDNISolone sodium succinate (SOLU-MEDROL) 125 mg/2 mL injection 80 mg  80 mg Intravenous Q24H Allie Bossier, MD   80 mg at 06/24/21 1123   multivitamin with minerals tablet 1 tablet  1 tablet Oral Daily Allie Bossier, MD         Discharge Medications: Please see discharge summary for a list of discharge medications.  Relevant Imaging Results:  Relevant Lab Results:   Additional Information SSN [id number]  Elliot Gurney [place], [place]

## 2021-06-25 NOTE — Progress Notes (Signed)
Initial Nutrition Assessment  DOCUMENTATION CODES:   Obesity unspecified  INTERVENTION:   Glucerna Shake po BID, each supplement provides 220 kcal and 10 grams of protein. MVI with minerals daily.  NUTRITION DIAGNOSIS:   Inadequate oral intake related to decreased appetite as evidenced by per patient/family report.  GOAL:   Patient will meet greater than or equal to 90% of their needs  MONITOR:   PO intake, Supplement acceptance  REASON FOR ASSESSMENT:   Malnutrition Screening Tool    ASSESSMENT:   80 yo female admitted with ambulatory dysfunction, L leg pain. PMH includes HLD, HTN, DM-2, spinal stenosis, A fib, lumbar nerve root impingement.  Per chart review, patient has recently been eating poorly.   Her mobility has recently decreased to the point of being unable to walk d/t leg pain. Neurosurgery is following, but patient does not want surgery at this time. Plans to try IV steroids to lessen her symptoms and improve mobility.   Patient has a history of lymphedema and has been taking Lasix at home. Weight seems to have fluctuated a lot over the past 3-6 months, likely related to lymphedema.  Wt Readings from Last 6 Encounters:  06/25/21 101.8 kg  06/07/21 99.8 kg  03/31/21 90.9 kg  03/15/21 102 kg  01/24/21 104.3 kg  12/09/20 104.4 kg    Attempted to call patient to discuss recent intake, but no one answered when room number called. Per MD notes, son reported that patient has been eating poorly recently.  Labs reviewed. Na 134 CBG: 144-138  Medications reviewed and include Novolog, Solumedrol.   NUTRITION - FOCUSED PHYSICAL EXAM:  Unable to complete  Diet Order:   Diet Order             Diet Carb Modified Fluid consistency: Thin; Room service appropriate? Yes with Assist  Diet effective now                   EDUCATION NEEDS:   Not appropriate for education at this time  Skin:  Skin Assessment: Reviewed RN Assessment  Last BM:   11/21  Height:   Ht Readings from Last 1 Encounters:  06/23/21 5\' 4"  (1.626 m)    Weight:   Wt Readings from Last 1 Encounters:  06/25/21 101.8 kg    BMI:  Body mass index is 38.52 kg/m.  Estimated Nutritional Needs:   Kcal:  1700-1900  Protein:  105-120 gm  Fluid:  >/= 1.9 L    Lucas Mallow, RD, LDN, CNSC Please refer to Amion for contact information.

## 2021-06-25 NOTE — Plan of Care (Signed)
  Problem: Education: Goal: Knowledge of General Education information will improve Description Including pain rating scale, medication(s)/side effects and non-pharmacologic comfort measures Outcome: Progressing   Problem: Health Behavior/Discharge Planning: Goal: Ability to manage health-related needs will improve Outcome: Progressing   

## 2021-06-25 NOTE — Progress Notes (Signed)
Mobility Specialist Progress Note   06/25/21 1100  Mobility  Activity Turned to right side;Turned to left side  Level of Assistance Minimal assist, patient does 75% or more  Assistive Device None  Mobility Response Tolerated well  Mobility performed by Mobility specialist;Nurse tech  $Mobility charge 1 Mobility   Assisted NT in safely maneuvering pt for pericare and linen changes while laying in bed.  [name] Falling Mobility Specialist Phone Number [phone]

## 2021-06-25 NOTE — TOC Progression Note (Signed)
Transition of Care [facility]) - Progression Note    Patient Details  Name: Kayla [name] MRN: [id number] Date of Birth: 1940/09/02  Transition of Care [facility]) CM/SW Contact  Elliot Gurney [place], [place] Phone Number: [phone] 06/25/2021, 12:10 PM  Clinical Narrative:    Patient agreeable to SNF search to include [facility] as well as extended search to additional facilities in the event [facility] is not available.  Transition of Care to continue to follow  [facility], LCSW Transition of Care [phone]    Expected Discharge Plan: [place] Barriers to Discharge: Continued Medical Work up  Expected Discharge Plan and Services Expected Discharge Plan: [place]   Discharge Planning Services: CM Consult   Living arrangements for the past 2 months: [place] Agency:  Jackquline Denmark active for home health, [facility] provided by family on Monday and Thursday)         Social Determinants of Health (SDOH) Interventions    Readmission Risk Interventions No flowsheet data found.

## 2021-06-25 NOTE — Progress Notes (Signed)
PROGRESS NOTE    Kayla [name]  XIP:[id number] DOB: [birth year] DOA: 06/22/2021 PCP: Denita Lung, MD   Brief Narrative:  Kayla [name] is a 80 y.o. BF PMHx DM; HTN; A-Fib on Eliquis; Dementia, PE  Obesity presenting with ambulatory dysfunction.  She initially came in for aflutter in August.  The pain got so bad that she couldn't stand up.  She could do for herself but her son was there and she decided she needed to come in to the hospital.  She has been unable to walk progressively since her last hospitalization.  She went to SNF rehab after her last hospitalization and she was doing ok initially.  She was doing ok and then all at once her left leg starting giving her problems.  She denies back pain.  She doesn't move either leg well but the left is worse.  She is confused about what is going on.  She is very upset that her sons are not here and wants to know why I don't know where they are.   I called and attempted to speak with her sons, but was unable to reach either at the time of admission.     She was previously hospitalized from 8/5-16 for new-onset atrial flutter.  She was started on Coreg and Eliquis.  She also had L ankle pain and was started on treatment for gout.  Ambulatory Status:  Ambulated with a walker last month   Subjective: 11/26 afebrile overnight, A/O x4.  States slight decrease in lower extremity pain.   Assessment & Plan:  Covid vaccination; vaccinated 1/3.  Requests second vaccination.  11/25 ordered Bivalent vaccination-Coreg 6.25 mg  Principal Problem:   Ambulatory dysfunction Active Problems:   Class 2 obesity due to excess calories with body mass index (BMI) of 37.0 to 37.9 in adult   Hyperlipidemia associated with type 2 diabetes mellitus ([place])   Primary hypertension   Type 2 diabetes mellitus (H[facility])   Generalized weakness   Spinal stenosis   Unspecified atrial fibrillation ([place])   Spinal stenosis of lumbar region at multiple levels   Lumbar  nerve root impingement  Generalized weakness/Ambulatory dysfunction -Patient previously admitted with new-onset afib -She was discharged to SNF rehab -She appears to have stabilized while there but has had deterioration while at home -The acuity of that deterioration is unclear, as the patient is a poor historian and her children were not available in person or by telephone -Will observe for now with PT/OT/ST (cognitive evaluation) consults -11/26 patient DOES NOT appear to have dementia.  If dementia is present very very mild   Spinal stenosis/Nerve Root impingement at multiple levels L-spine -Imaging indicates spinal stenosis -It is possible that this is contributing to her presentation -However, she is really having B LE weakness (L > R but that difference is subtle) -Further, I suggested calling neurosurgery to consult and she reported that she is not a surgical candidate and so the consult is not necessary; will not call currently -Per Dr. Karmen Bongo note on 11/24 Dr. Trenton Gammon neurosurgeon was secure chatted -11/25 discussed case at length with Dr. Trenton Gammon neurosurgeon recommends starting patient on course of high-dose steroids.  Believes surgery might be beneficial to patient however patient states does not want surgery.  Dr. Trenton Gammon stated he is going to contact family and discuss findings with them. - 11/25 Solu-Medrol 80 mg daily -11/25 spoke at length with patient concerning spinal stenosis/nerve root impingement patient emphatically stated right now she does not  want surgery.  She did entertain that she would speak with her sons and think it over and give [place] her answer. -11/26 states she has decided she still does not want surgery after speaking with Dr. Trenton Gammon neurosurgeon.  States that she understands implications in regards to her deteriorating condition.   Afib -Rate control with Coreg 6.25 mg BID -Losartan 50 mg daily -Apixaban 5 mg BID -11/25 currently NSR  HTN -See A.  Fib  DM type II controlled without complication -Recent O8N was 6.7, -hold Glucophage -11/25 increase resistant SSI   HLD -Lipitor 80 mg daily -Hold Zetia due to limited inpatient utility  Anemia - 11/26 anemia panel pending   Obesity (BMI 37.5 kg/m) -Weight loss should be encouraged -Outpatient PCP/bariatric medicine f/u encouraged     DVT prophylaxis: Apixaban Code Status: Full Family Communication:  Status is: Inpatient    Dispo: The patient is from: Home              Anticipated d/c is to: Home              Anticipated d/c date is: > 3 days              Patient currently is not medically stable to d/c.      Consultants:  Neurosurgery Dr. Trenton Gammon    Procedures/Significant Events:  11/23 CT L-spine W0 contrast  1. L3-L4 and L4-L5 moderate to severe spinal canal stenosis and moderate bilateral neural foraminal narrowing. 2. L2-L3 moderate spinal canal stenosis and mild bilateral neural foraminal narrowing. 11/23 MRI L-spine W0 contrast 1. L3-L4 moderate to severe spinal canal stenosis and mild-to-moderate left neural foraminal narrowing. Effacement of the lateral recesses at this level likely compresses the descending L4 nerves. 2. L2-L3 moderate spinal canal stenosis. Effacement of the lateral recesses at this level likely compresses the descending L3 nerves. 3. L4-L5 mild spinal canal stenosis and mild left neural foraminal narrowing. Narrowing of the lateral recesses at this level could affect the descending L5 nerves. 4. Narrowing of the lateral recesses at L1-L2 could affect the descending L2 nerves.  I have personally reviewed and interpreted all radiology studies and my findings are as above.  VENTILATOR SETTINGS:    Cultures   Antimicrobials:    Devices    LINES / TUBES:      Continuous Infusions:   Objective: Vitals:   06/24/21 2054 06/25/21 0453 06/25/21 0624 06/25/21 0810  BP: (!) 141/78 132/70  139/70  Pulse: 85 66   72  Resp: 18 16  17   Temp: 97.7 F (36.5 C) 97.6 F (36.4 C)  98 F (36.7 C)  TempSrc: Oral   Oral  SpO2: 96% 95%  100%  Weight:   101.8 kg   Height:       No intake or output data in the 24 hours ending 06/25/21 1140  Filed Weights   06/23/21 1521 06/24/21 0451 06/25/21 0624  Weight: 99.3 kg 99.3 kg 101.8 kg    Examination:  General: A/O x4, No acute respiratory distress Eyes: negative scleral hemorrhage, negative anisocoria, negative icterus ENT: Negative Runny nose, negative gingival bleeding, Neck:  Negative scars, masses, torticollis, lymphadenopathy, JVD Lungs: Clear to auscultation bilaterally without wheezes or crackles Cardiovascular: Regular rate and rhythm without murmur gallop or rub normal S1 and S2 Abdomen: OBESE, negative abdominal pain, nondistended, positive soft, bowel sounds, no rebound, no ascites, no appreciable mass Extremities: No significant cyanosis, clubbing, or edema bilateral lower extremities Skin: Negative rashes, lesions, ulcers Psychiatric:  Negative depression, negative anxiety, negative fatigue, negative mania  Central nervous system:  Cranial nerves II through XII intact, tongue/uvula midline, all extremities muscle strength 5/5, except RLE strength 4/5, LLE strength 2/5 sensation in L LE decreased to soft touch and pinprick.  negative dysarthria, negative expressive aphasia, negative receptive aphasia.  .     Data Reviewed: Care during the described time interval was provided by me .  I have reviewed this patient's available data, including medical history, events of note, physical examination, and all test results as part of my evaluation.   CBC: Recent Labs  Lab 06/22/21 1712 06/24/21 0926 06/25/21 0140  WBC 7.1 7.1 7.1  NEUTROABS 5.1 4.7 6.2  HGB 9.7* 8.5* 7.9*  HCT 30.7* 27.1* 24.6*  MCV 83.2 82.4 80.9  PLT 328 308 233    Basic Metabolic Panel: Recent Labs  Lab 06/22/21 1712 06/24/21 0926 06/25/21 0140  NA 133* 134*  134*  K 4.5 4.3 4.4  CL 98 101 102  CO2 25 25 26   GLUCOSE 124* 114* 141*  BUN 21 16 23   CREATININE 1.03* 0.88 0.96  CALCIUM 9.0 8.3* 8.3*  MG  --  1.6* 1.9  PHOS  --  2.9 2.7    GFR: Estimated Creatinine Clearance: 52.4 mL/min (by C-G formula based on SCr of 0.96 mg/dL). Liver Function Tests: Recent Labs  Lab 06/22/21 1712 06/24/21 0926 06/25/21 0140  AST 18 15 14*  ALT 11 10 14   ALKPHOS 87 62 73  BILITOT 0.8 0.5 0.5  PROT 7.9 6.2* 6.3*  ALBUMIN 3.0* 2.3* 2.2*    No results for input(s): LIPASE, AMYLASE in the last 168 hours. No results for input(s): AMMONIA in the last 168 hours. Coagulation Profile: No results for input(s): INR, PROTIME in the last 168 hours. Cardiac Enzymes: No results for input(s): CKTOTAL, CKMB, CKMBINDEX, TROPONINI in the last 168 hours. BNP (last 3 results) No results for input(s): PROBNP in the last 8760 hours. HbA1C: Recent Labs    06/24/21 0926  HGBA1C 6.9*   CBG: Recent Labs  Lab 06/24/21 1557 06/24/21 1947 06/24/21 2354 06/25/21 0347 06/25/21 0808  GLUCAP 188* 216* 150* 144* 138*    Lipid Profile: Recent Labs    06/24/21 0926  CHOL 113  HDL 32*  LDLCALC 70  TRIG 57  CHOLHDL 3.5    Thyroid Function Tests: Recent Labs    06/22/21 1830  TSH 1.794    Anemia Panel: No results for input(s): VITAMINB12, FOLATE, FERRITIN, TIBC, IRON, RETICCTPCT in the last 72 hours. Urine analysis:    Component Value Date/Time   COLORURINE YELLOW 06/22/2021 2113   APPEARANCEUR CLEAR 06/22/2021 2113   LABSPEC 1.014 06/22/2021 2113   PHURINE 5.0 06/22/2021 2113   GLUCOSEU NEGATIVE 06/22/2021 2113   HGBUR NEGATIVE 06/22/2021 2113   BILIRUBINUR NEGATIVE 06/22/2021 2113   BILIRUBINUR n 11/27/2016 1129   KETONESUR NEGATIVE 06/22/2021 2113   PROTEINUR NEGATIVE 06/22/2021 2113   UROBILINOGEN negative (A) 11/27/2016 1129   UROBILINOGEN 0.2 12/01/2010 2352   NITRITE NEGATIVE 06/22/2021 2113   LEUKOCYTESUR NEGATIVE 06/22/2021 2113    Sepsis Labs: @LABRCNTIP (procalcitonin:4,lacticidven:4)  ) Recent Results (from the past 240 hour(s))  Resp Panel by RT-PCR (Flu A&B, Covid)     Status: None   Collection Time: 06/22/21  8:20 PM   Specimen: Nasopharyngeal(NP) swabs in vial transport medium  Result Value Ref Range Status   SARS Coronavirus 2 by RT PCR NEGATIVE NEGATIVE Final    Comment: (NOTE) SARS-CoV-2 target nucleic acids  are NOT DETECTED.  The SARS-CoV-2 RNA is generally detectable in upper respiratory specimens during the acute phase of infection. The lowest concentration of SARS-CoV-2 viral copies this assay can detect is 138 copies/mL. A negative result does not preclude SARS-Cov-2 infection and should not be used as the sole basis for treatment or other patient management decisions. A negative result may occur with  improper specimen collection/handling, submission of specimen other than nasopharyngeal swab, presence of viral mutation(s) within the areas targeted by this assay, and inadequate number of viral copies(<138 copies/mL). A negative result must be combined with clinical observations, patient history, and epidemiological information. The expected result is Negative.  Fact Sheet for Patients:  EntrepreneurPulse.com.au  Fact Sheet for Healthcare Providers:  IncredibleEmployment.be  This test is no t yet approved or cleared by the [place] FDA and  has been authorized for detection and/or diagnosis of SARS-CoV-2 by FDA under an Emergency Use Authorization (EUA). This EUA will remain  in effect (meaning this test can be used) for the duration of the COVID-19 declaration under Section 564(b)(1) of the Act, 21 U.S.C.section 360bbb-3(b)(1), unless the authorization is terminated  or revoked sooner.       Influenza A by PCR NEGATIVE NEGATIVE Final   Influenza B by PCR NEGATIVE NEGATIVE Final    Comment: (NOTE) The Xpert Xpress SARS-CoV-2/FLU/RSV plus  assay is intended as an aid in the diagnosis of influenza from Nasopharyngeal swab specimens and should not be used as a sole basis for treatment. Nasal washings and aspirates are unacceptable for Xpert Xpress SARS-CoV-2/FLU/RSV testing.  Fact Sheet for Patients: EntrepreneurPulse.com.au  Fact Sheet for Healthcare Providers: IncredibleEmployment.be  This test is not yet approved or cleared by the [place] FDA and has been authorized for detection and/or diagnosis of SARS-CoV-2 by FDA under an Emergency Use Authorization (EUA). This EUA will remain in effect (meaning this test can be used) for the duration of the COVID-19 declaration under Section 564(b)(1) of the Act, 21 U.S.C. section 360bbb-3(b)(1), unless the authorization is terminated or revoked.  Performed at [facility], [place] [address], [place], [place] [address]          Radiology Studies: No results found.      Scheduled Meds:  apixaban  5 mg Oral BID   atorvastatin  80 mg Oral QHS   carvedilol  6.25 mg Oral BID   COVID-19 mRNA bivalent vaccine (Pfizer)  0.3 mL Intramuscular Once   feeding supplement (GLUCERNA SHAKE)  237 mL Oral BID BM   gabapentin  100 mg Oral TID   insulin aspart  0-20 Units Subcutaneous Q4H   losartan  50 mg Oral Daily   methylPREDNISolone (SOLU-MEDROL) injection  80 mg Intravenous Q24H   multivitamin with minerals  1 tablet Oral Daily   Continuous Infusions:   LOS: 1 day   The patient is critically ill with multiple organ systems failure and requires high complexity decision making for assessment and support, frequent evaluation and titration of therapies, application of advanced monitoring technologies and extensive interpretation of multiple databases. Critical Care Time devoted to patient care services described in this note  Time spent: 40 minutes     Jasmin Winberry, Geraldo Docker, MD [facility]   If 7PM-7AM, please contact  night-coverage 06/25/2021, 11:40 AM

## 2021-06-26 DIAGNOSIS — R262 Difficulty in walking, not elsewhere classified: Secondary | ICD-10-CM | POA: Diagnosis not present

## 2021-06-26 DIAGNOSIS — R531 Weakness: Secondary | ICD-10-CM | POA: Diagnosis not present

## 2021-06-26 DIAGNOSIS — E1169 Type 2 diabetes mellitus with other specified complication: Secondary | ICD-10-CM | POA: Diagnosis not present

## 2021-06-26 LAB — COMPREHENSIVE METABOLIC PANEL
ALT: 25 U/L (ref 0–44)
AST: 29 U/L (ref 15–41)
Albumin: 2.1 g/dL — ABNORMAL LOW (ref 3.5–5.0)
Alkaline Phosphatase: 68 U/L (ref 38–126)
Anion gap: 7 (ref 5–15)
BUN: 22 mg/dL (ref 8–23)
CO2: 24 mmol/L (ref 22–32)
Calcium: 8.1 mg/dL — ABNORMAL LOW (ref 8.9–10.3)
Chloride: 101 mmol/L (ref 98–111)
Creatinine, Ser: 0.88 mg/dL (ref 0.44–1.00)
GFR, Estimated: >60 mL/min (ref >60)
Glucose, Bld: 165 mg/dL — ABNORMAL HIGH (ref 70–99)
Potassium: 4.4 mmol/L (ref 3.5–5.1)
Sodium: 132 mmol/L — ABNORMAL LOW (ref 135–145)
Total Bilirubin: 0.3 mg/dL (ref 0.3–1.2)
Total Protein: 6.1 g/dL — ABNORMAL LOW (ref 6.5–8.1)

## 2021-06-26 LAB — CBC WITH DIFFERENTIAL/PLATELET
Abs Immature Granulocytes: 0.04 10*3/uL (ref 0.00–0.07)
Basophils Absolute: 0 10*3/uL (ref 0.0–0.1)
Basophils Relative: 0 %
Eosinophils Absolute: 0 10*3/uL (ref 0.0–0.5)
Eosinophils Relative: 0 %
HCT: 24.9 % — ABNORMAL LOW (ref 36.0–46.0)
Hemoglobin: 7.7 g/dL — ABNORMAL LOW (ref 12.0–15.0)
Immature Granulocytes: 0 %
Lymphocytes Relative: 8 %
Lymphs Abs: 0.8 10*3/uL (ref 0.7–4.0)
MCH: 25.2 pg — ABNORMAL LOW (ref 26.0–34.0)
MCHC: 30.9 g/dL (ref 30.0–36.0)
MCV: 81.4 fL (ref 80.0–100.0)
Monocytes Absolute: 0.6 10*3/uL (ref 0.1–1.0)
Monocytes Relative: 6 %
Neutro Abs: 9.1 10*3/uL — ABNORMAL HIGH (ref 1.7–7.7)
Neutrophils Relative %: 86 %
Platelets: 345 10*3/uL (ref 150–400)
RBC: 3.06 MIL/uL — ABNORMAL LOW (ref 3.87–5.11)
RDW: 14.4 % (ref 11.5–15.5)
WBC: 10.5 10*3/uL (ref 4.0–10.5)
nRBC: 0 % (ref 0.0–0.2)

## 2021-06-26 LAB — RETICULOCYTES
Immature Retic Fract: 19.8 % — ABNORMAL HIGH (ref 2.3–15.9)
RBC.: 3.03 MIL/uL — ABNORMAL LOW (ref 3.87–5.11)
Retic Count, Absolute: 32.7 10*3/uL (ref 19.0–186.0)
Retic Ct Pct: 1.1 % (ref 0.4–3.1)

## 2021-06-26 LAB — FERRITIN: Ferritin: 72 ng/mL (ref 11–307)

## 2021-06-26 LAB — GLUCOSE, CAPILLARY
Glucose-Capillary: 144 mg/dL — ABNORMAL HIGH (ref 70–99)
Glucose-Capillary: 131 mg/dL — ABNORMAL HIGH (ref 70–99)
Glucose-Capillary: 98 mg/dL (ref 70–99)
Glucose-Capillary: 248 mg/dL — ABNORMAL HIGH (ref 70–99)
Glucose-Capillary: 231 mg/dL — ABNORMAL HIGH (ref 70–99)

## 2021-06-26 LAB — MAGNESIUM: Magnesium: 1.8 mg/dL (ref 1.7–2.4)

## 2021-06-26 LAB — PHOSPHORUS: Phosphorus: 2.3 mg/dL — ABNORMAL LOW (ref 2.5–4.6)

## 2021-06-26 LAB — IRON AND TIBC
Iron: 42 ug/dL (ref 28–170)
Saturation Ratios: 20 % (ref 10.4–31.8)
TIBC: 211 ug/dL — ABNORMAL LOW (ref 250–450)
UIBC: 169 ug/dL

## 2021-06-26 LAB — VITAMIN B12: Vitamin B-12: 224 pg/mL (ref 180–914)

## 2021-06-26 LAB — FOLATE: Folate: 7.3 ng/mL (ref >5.9)

## 2021-06-26 MED ORDER — INSULIN DETEMIR 100 UNIT/ML ~~LOC~~ SOLN
8.0000 [IU] | Freq: Every day | SUBCUTANEOUS | Status: DC
  Administered 2021-06-26 – 2021-06-30 (×5): 8 [IU] via SUBCUTANEOUS
  Filled 2021-06-26 (×5): qty 0.08

## 2021-06-26 NOTE — Progress Notes (Signed)
NEUROSURGERY PROGRESS NOTE  Doing well. Complains of appropriate back pain with some left leg pain as well but thinks it has improved some. She can now tolerate sheets laying on her legs. Still not interested in any type of surgery. No change neurologically. Continue therapies and pain management.    Temp:  [97.5 F (36.4 C)-98.2 F (36.8 C)] 97.5 F (36.4 C) (11/27 0748) Pulse Rate:  [60-84] 72 (11/27 0748) Resp:  [15-16] 16 (11/27 0748) BP: (120-160)/(50-70) 160/70 (11/27 0748) SpO2:  [98 %-100 %] 98 % (11/27 0748) Weight:  [98.9 kg] 98.9 kg (11/27 0348)  Eleonore Chiquito, NP 06/26/2021 1:35 PM

## 2021-06-26 NOTE — Progress Notes (Addendum)
PROGRESS NOTE    Kayla [name]  BOF:[id number] DOB: 05/14/1941 DOA: 06/22/2021 PCP: Denita Lung, MD   Brief Narrative:  Kayla [name] is a 80 y.o. BF PMHx DM; HTN; A-Fib on Eliquis; Dementia, PE  Obesity presenting with ambulatory dysfunction.  She initially came in for aflutter in August.  The pain got so bad that she couldn't stand up.  She could do for herself but her son was there and she decided she needed to come in to the hospital.  She has been unable to walk progressively since her last hospitalization.  She went to SNF rehab after her last hospitalization and she was doing ok initially.  She was doing ok and then all at once her left leg starting giving her problems.  She denies back pain.  She doesn't move either leg well but the left is worse.  She is confused about what is going on.  She is very upset that her sons are not here and wants to know why I don't know where they are.   I called and attempted to speak with her sons, but was unable to reach either at the time of admission.     She was previously hospitalized from 8/5-16 for new-onset atrial flutter.  She was started on Coreg and Eliquis.  She also had L ankle pain and was started on treatment for gout.  Ambulatory Status:  Ambulated with a walker last month   Subjective: 11/27 afebrile overnight, A/O x4.  States lower extremity pain slightly less than yesterday feels a little bit stronger.   Assessment & Plan:  Covid vaccination; vaccinated 1/3.  Requests second vaccination.  11/25 ordered Bivalent vaccination-Coreg 6.25 mg  Principal Problem:   Ambulatory dysfunction Active Problems:   Class 2 obesity due to excess calories with body mass index (BMI) of 37.0 to 37.9 in adult   Hyperlipidemia associated with type 2 diabetes mellitus ([place])   Primary hypertension   Type 2 diabetes mellitus (H[facility])   Generalized weakness   Spinal stenosis   Unspecified atrial fibrillation ([place])   Spinal stenosis of lumbar  region at multiple levels   Lumbar nerve root impingement  Generalized weakness/Ambulatory dysfunction -Patient previously admitted with new-onset afib -She was discharged to SNF rehab -She appears to have stabilized while there but has had deterioration while at home -The acuity of that deterioration is unclear, as the patient is a poor historian and her children were not available in person or by telephone -Will observe for now with PT/OT/ST (cognitive evaluation) consults -11/26 patient DOES NOT appear to have dementia.  If dementia is present very very mild   Spinal stenosis/Nerve Root impingement at multiple levels L-spine -Imaging indicates spinal stenosis -It is possible that this is contributing to her presentation -However, she is really having B LE weakness (L > R but that difference is subtle) -Further, I suggested calling neurosurgery to consult and she reported that she is not a surgical candidate and so the consult is not necessary; will not call currently -Per Dr. Karmen Bongo note on 11/24 Dr. Trenton Gammon neurosurgeon was secure chatted -11/25 discussed case at length with Dr. Trenton Gammon neurosurgeon recommends starting patient on course of high-dose steroids.  Believes surgery might be beneficial to patient however patient states does not want surgery.  Dr. Trenton Gammon stated he is going to contact family and discuss findings with them. - 11/25 Solu-Medrol 80 mg daily -11/25 spoke at length with patient concerning spinal stenosis/nerve root impingement patient emphatically  stated right now she does not want surgery.  She did entertain that she would speak with her sons and think it over and give [place] her answer. -11/26 states she has decided she still does not want surgery after speaking with Dr. Trenton Gammon neurosurgeon.  States that she understands implications in regards to her deteriorating condition. -11/27 states she spoke with Dr. Irven [place] apps today and again expressed she had no desire for  surgery but would speak with Dr. Trenton Gammon on 11/28 during his rounds.   Afib -Rate control with Coreg 6.25 mg BID -Losartan 50 mg daily -Apixaban 5 mg BID -11/25 currently NSR  HTN -See A. Fib  DM type II controlled without complication -Recent H9Q was 6.7, -hold Glucophage -11/25 increase resistant SSI -11/27 Levemir 8 units daily   HLD -Lipitor 80 mg daily -Hold Zetia due to limited inpatient utility  Anemia Lab Results  Component Value Date   HGB 7.7 (L) 06/26/2021   HGB 7.9 (L) 06/25/2021   HGB 8.5 (L) 06/24/2021   HGB 9.7 (L) 06/22/2021   HGB 9.3 (L) 03/07/2021  - 11/26 anemia panel pending -11/27 occult blood pending   Obesity (BMI 37.5 kg/m) -Weight loss should be encouraged -Outpatient PCP/bariatric medicine f/u encouraged     DVT prophylaxis: Apixaban Code Status: Full Family Communication: 11/27 attempted to call [name] (son) was forwarded to voicemail left message that I was calling from hospital to update him on mother's condition. Status is: Inpatient    Dispo: The patient is from: Home              Anticipated d/c is to: Home              Anticipated d/c date is: > 3 days              Patient currently is not medically stable to d/c.      Consultants:  Neurosurgery Dr. Trenton Gammon    Procedures/Significant Events:  11/23 CT L-spine W0 contrast  1. L3-L4 and L4-L5 moderate to severe spinal canal stenosis and moderate bilateral neural foraminal narrowing. 2. L2-L3 moderate spinal canal stenosis and mild bilateral neural foraminal narrowing. 11/23 MRI L-spine W0 contrast 1. L3-L4 moderate to severe spinal canal stenosis and mild-to-moderate left neural foraminal narrowing. Effacement of the lateral recesses at this level likely compresses the descending L4 nerves. 2. L2-L3 moderate spinal canal stenosis. Effacement of the lateral recesses at this level likely compresses the descending L3 nerves. 3. L4-L5 mild spinal canal stenosis and mild  left neural foraminal narrowing. Narrowing of the lateral recesses at this level could affect the descending L5 nerves. 4. Narrowing of the lateral recesses at L1-L2 could affect the descending L2 nerves.  I have personally reviewed and interpreted all radiology studies and my findings are as above.  VENTILATOR SETTINGS:    Cultures   Antimicrobials:    Devices    LINES / TUBES:      Continuous Infusions:   Objective: Vitals:   06/25/21 1653 06/25/21 2014 06/26/21 0348 06/26/21 0748  BP: 140/61 (!) 130/50 (!) 120/59 (!) 160/70  Pulse: 71 84 60 72  Resp: 16 15 15 16   Temp: 97.8 F (36.6 C) 97.8 F (36.6 C) 98.2 F (36.8 C) (!) 97.5 F (36.4 C)  TempSrc: Oral Oral Axillary Oral  SpO2: 98% 100% 100% 98%  Weight:   98.9 kg   Height:        Intake/Output Summary (Last 24 hours) at 06/26/2021 1317 Last data filed  at 06/26/2021 1000 Gross per 24 hour  Intake 240 ml  Output 1000 ml  Net -760 ml    Filed Weights   06/24/21 0451 06/25/21 0624 06/26/21 0348  Weight: 99.3 kg 101.8 kg 98.9 kg    Examination:  General: A/O x4, No acute respiratory distress Eyes: negative scleral hemorrhage, negative anisocoria, negative icterus ENT: Negative Runny nose, negative gingival bleeding, Neck:  Negative scars, masses, torticollis, lymphadenopathy, JVD Lungs: Clear to auscultation bilaterally without wheezes or crackles Cardiovascular: Regular rate and rhythm without murmur gallop or rub normal S1 and S2 Abdomen: OBESE, negative abdominal pain, nondistended, positive soft, bowel sounds, no rebound, no ascites, no appreciable mass Extremities: No significant cyanosis, clubbing, or edema bilateral lower extremities Skin: Negative rashes, lesions, ulcers Psychiatric:  Negative depression, negative anxiety, negative fatigue, negative mania  Central nervous system:  Cranial nerves II through XII intact, tongue/uvula midline, all extremities muscle strength 5/5, except RLE  strength 4/5, LLE strength 3/5 sensation in L LE decreased to soft touch and pinprick.  negative dysarthria, negative expressive aphasia, negative receptive aphasia.  .     Data Reviewed: Care during the described time interval was provided by me .  I have reviewed this patient's available data, including medical history, events of note, physical examination, and all test results as part of my evaluation.   CBC: Recent Labs  Lab 06/22/21 1712 06/24/21 0926 06/25/21 0140 06/26/21 0123  WBC 7.1 7.1 7.1 10.5  NEUTROABS 5.1 4.7 6.2 9.1*  HGB 9.7* 8.5* 7.9* 7.7*  HCT 30.7* 27.1* 24.6* 24.9*  MCV 83.2 82.4 80.9 81.4  PLT 328 308 314 235    Basic Metabolic Panel: Recent Labs  Lab 06/22/21 1712 06/24/21 0926 06/25/21 0140 06/26/21 0123  NA 133* 134* 134* 132*  K 4.5 4.3 4.4 4.4  CL 98 101 102 101  CO2 25 25 26 24   GLUCOSE 124* 114* 141* 165*  BUN 21 16 23 22   CREATININE 1.03* 0.88 0.96 0.88  CALCIUM 9.0 8.3* 8.3* 8.1*  MG  --  1.6* 1.9 1.8  PHOS  --  2.9 2.7 2.3*    GFR: Estimated Creatinine Clearance: 56.3 mL/min (by C-G formula based on SCr of 0.88 mg/dL). Liver Function Tests: Recent Labs  Lab 06/22/21 1712 06/24/21 0926 06/25/21 0140 06/26/21 0123  AST 18 15 14* 29  ALT 11 10 14 25   ALKPHOS 87 62 73 68  BILITOT 0.8 0.5 0.5 0.3  PROT 7.9 6.2* 6.3* 6.1*  ALBUMIN 3.0* 2.3* 2.2* 2.1*    No results for input(s): LIPASE, AMYLASE in the last 168 hours. No results for input(s): AMMONIA in the last 168 hours. Coagulation Profile: No results for input(s): INR, PROTIME in the last 168 hours. Cardiac Enzymes: No results for input(s): CKTOTAL, CKMB, CKMBINDEX, TROPONINI in the last 168 hours. BNP (last 3 results) No results for input(s): PROBNP in the last 8760 hours. HbA1C: Recent Labs    06/24/21 0926  HGBA1C 6.9*    CBG: Recent Labs  Lab 06/25/21 2034 06/25/21 2309 06/26/21 0350 06/26/21 0745 06/26/21 1142  GLUCAP 281* 217* 144* 131* 98    Lipid  Profile: Recent Labs    06/24/21 0926  CHOL 113  HDL 32*  LDLCALC 70  TRIG 57  CHOLHDL 3.5    Thyroid Function Tests: No results for input(s): TSH, T4TOTAL, FREET4, T3FREE, THYROIDAB in the last 72 hours.  Anemia Panel: Recent Labs    06/26/21 0123  VITAMINB12 224  FOLATE 7.3  FERRITIN 72  TIBC 211*  IRON 42  RETICCTPCT 1.1   Urine analysis:    Component Value Date/Time   COLORURINE YELLOW 06/22/2021 2113   APPEARANCEUR CLEAR 06/22/2021 2113   LABSPEC 1.014 06/22/2021 2113   PHURINE 5.0 06/22/2021 2113   GLUCOSEU NEGATIVE 06/22/2021 2113   HGBUR NEGATIVE 06/22/2021 2113   BILIRUBINUR NEGATIVE 06/22/2021 2113   BILIRUBINUR n 11/27/2016 1129   KETONESUR NEGATIVE 06/22/2021 2113   PROTEINUR NEGATIVE 06/22/2021 2113   UROBILINOGEN negative (A) 11/27/2016 1129   UROBILINOGEN 0.2 12/01/2010 2352   NITRITE NEGATIVE 06/22/2021 2113   LEUKOCYTESUR NEGATIVE 06/22/2021 2113   Sepsis Labs: @LABRCNTIP (procalcitonin:4,lacticidven:4)  ) Recent Results (from the past 240 hour(s))  Resp Panel by RT-PCR (Flu A&B, Covid)     Status: None   Collection Time: 06/22/21  8:20 PM   Specimen: Nasopharyngeal(NP) swabs in vial transport medium  Result Value Ref Range Status   SARS Coronavirus 2 by RT PCR NEGATIVE NEGATIVE Final    Comment: (NOTE) SARS-CoV-2 target nucleic acids are NOT DETECTED.  The SARS-CoV-2 RNA is generally detectable in upper respiratory specimens during the acute phase of infection. The lowest concentration of SARS-CoV-2 viral copies this assay can detect is 138 copies/mL. A negative result does not preclude SARS-Cov-2 infection and should not be used as the sole basis for treatment or other patient management decisions. A negative result may occur with  improper specimen collection/handling, submission of specimen other than nasopharyngeal swab, presence of viral mutation(s) within the areas targeted by this assay, and inadequate number of  viral copies(<138 copies/mL). A negative result must be combined with clinical observations, patient history, and epidemiological information. The expected result is Negative.  Fact Sheet for Patients:  EntrepreneurPulse.com.au  Fact Sheet for Healthcare Providers:  IncredibleEmployment.be  This test is no t yet approved or cleared by the [place] FDA and  has been authorized for detection and/or diagnosis of SARS-CoV-2 by FDA under an Emergency Use Authorization (EUA). This EUA will remain  in effect (meaning this test can be used) for the duration of the COVID-19 declaration under Section 564(b)(1) of the Act, 21 U.S.C.section 360bbb-3(b)(1), unless the authorization is terminated  or revoked sooner.       Influenza A by PCR NEGATIVE NEGATIVE Final   Influenza B by PCR NEGATIVE NEGATIVE Final    Comment: (NOTE) The Xpert Xpress SARS-CoV-2/FLU/RSV plus assay is intended as an aid in the diagnosis of influenza from Nasopharyngeal swab specimens and should not be used as a sole basis for treatment. Nasal washings and aspirates are unacceptable for Xpert Xpress SARS-CoV-2/FLU/RSV testing.  Fact Sheet for Patients: EntrepreneurPulse.com.au  Fact Sheet for Healthcare Providers: IncredibleEmployment.be  This test is not yet approved or cleared by the [place] FDA and has been authorized for detection and/or diagnosis of SARS-CoV-2 by FDA under an Emergency Use Authorization (EUA). This EUA will remain in effect (meaning this test can be used) for the duration of the COVID-19 declaration under Section 564(b)(1) of the Act, 21 U.S.C. section 360bbb-3(b)(1), unless the authorization is terminated or revoked.  Performed at [facility], [place] [address], [place], [place] [address]          Radiology Studies: No results found.      Scheduled Meds:  apixaban  5 mg Oral BID    atorvastatin  80 mg Oral QHS   carvedilol  6.25 mg Oral BID   COVID-19 mRNA bivalent vaccine (Pfizer)  0.3 mL Intramuscular Once   feeding supplement (GLUCERNA  SHAKE)  237 mL Oral BID BM   gabapentin  100 mg Oral TID   insulin aspart  0-20 Units Subcutaneous Q4H   losartan  50 mg Oral Daily   methylPREDNISolone (SOLU-MEDROL) injection  80 mg Intravenous Q24H   multivitamin with minerals  1 tablet Oral Daily   Continuous Infusions:   LOS: 2 days   The patient is critically ill with multiple organ systems failure and requires high complexity decision making for assessment and support, frequent evaluation and titration of therapies, application of advanced monitoring technologies and extensive interpretation of multiple databases. Critical Care Time devoted to patient care services described in this note  Time spent: 40 minutes     Rowin Bayron, Geraldo Docker, MD [facility]   If 7PM-7AM, please contact night-coverage 06/26/2021, 1:17 PM

## 2021-06-27 DIAGNOSIS — R531 Weakness: Secondary | ICD-10-CM | POA: Diagnosis not present

## 2021-06-27 DIAGNOSIS — E1169 Type 2 diabetes mellitus with other specified complication: Secondary | ICD-10-CM | POA: Diagnosis not present

## 2021-06-27 DIAGNOSIS — R262 Difficulty in walking, not elsewhere classified: Secondary | ICD-10-CM | POA: Diagnosis not present

## 2021-06-27 LAB — GLUCOSE, CAPILLARY
Glucose-Capillary: 105 mg/dL — ABNORMAL HIGH (ref 70–99)
Glucose-Capillary: 118 mg/dL — ABNORMAL HIGH (ref 70–99)
Glucose-Capillary: 124 mg/dL — ABNORMAL HIGH (ref 70–99)
Glucose-Capillary: 139 mg/dL — ABNORMAL HIGH (ref 70–99)
Glucose-Capillary: 149 mg/dL — ABNORMAL HIGH (ref 70–99)
Glucose-Capillary: 242 mg/dL — ABNORMAL HIGH (ref 70–99)

## 2021-06-27 LAB — CBC WITH DIFFERENTIAL/PLATELET
Abs Immature Granulocytes: 0.05 10*3/uL (ref 0.00–0.07)
Basophils Absolute: 0 10*3/uL (ref 0.0–0.1)
Basophils Relative: 0 %
Eosinophils Absolute: 0 10*3/uL (ref 0.0–0.5)
Eosinophils Relative: 0 %
HCT: 25.7 % — ABNORMAL LOW (ref 36.0–46.0)
Hemoglobin: 8.1 g/dL — ABNORMAL LOW (ref 12.0–15.0)
Immature Granulocytes: 1 %
Lymphocytes Relative: 10 %
Lymphs Abs: 0.9 10*3/uL (ref 0.7–4.0)
MCH: 25.6 pg — ABNORMAL LOW (ref 26.0–34.0)
MCHC: 31.5 g/dL (ref 30.0–36.0)
MCV: 81.3 fL (ref 80.0–100.0)
Monocytes Absolute: 0.5 10*3/uL (ref 0.1–1.0)
Monocytes Relative: 5 %
Neutro Abs: 7.9 10*3/uL — ABNORMAL HIGH (ref 1.7–7.7)
Neutrophils Relative %: 84 %
Platelets: 346 10*3/uL (ref 150–400)
RBC: 3.16 MIL/uL — ABNORMAL LOW (ref 3.87–5.11)
RDW: 14.4 % (ref 11.5–15.5)
WBC: 9.3 10*3/uL (ref 4.0–10.5)
nRBC: 0 % (ref 0.0–0.2)

## 2021-06-27 LAB — COMPREHENSIVE METABOLIC PANEL
ALT: 29 U/L (ref 0–44)
AST: 24 U/L (ref 15–41)
Albumin: 2 g/dL — ABNORMAL LOW (ref 3.5–5.0)
Alkaline Phosphatase: 69 U/L (ref 38–126)
Anion gap: 5 (ref 5–15)
BUN: 24 mg/dL — ABNORMAL HIGH (ref 8–23)
CO2: 26 mmol/L (ref 22–32)
Calcium: 8.2 mg/dL — ABNORMAL LOW (ref 8.9–10.3)
Chloride: 106 mmol/L (ref 98–111)
Creatinine, Ser: 0.76 mg/dL (ref 0.44–1.00)
GFR, Estimated: >60 mL/min (ref >60)
Glucose, Bld: 143 mg/dL — ABNORMAL HIGH (ref 70–99)
Potassium: 4.8 mmol/L (ref 3.5–5.1)
Sodium: 137 mmol/L (ref 135–145)
Total Bilirubin: 0.5 mg/dL (ref 0.3–1.2)
Total Protein: 5.9 g/dL — ABNORMAL LOW (ref 6.5–8.1)

## 2021-06-27 LAB — PHOSPHORUS: Phosphorus: 2.6 mg/dL (ref 2.5–4.6)

## 2021-06-27 LAB — MAGNESIUM: Magnesium: 2 mg/dL (ref 1.7–2.4)

## 2021-06-27 NOTE — Care Management Important Message (Signed)
Important Message  Patient Details  Name: Kayla [name] MRN: [id number] Date of Birth: [birth year]   Medicare Important Message Given:  Yes     Joetta Manners 06/27/2021, 2:03 PM

## 2021-06-27 NOTE — Progress Notes (Signed)
Physical Therapy Treatment Patient Details Name: Kayla [name] MRN: [id number] DOB: 1940/12/26 Today's Date: 06/27/2021   History of Present Illness Pt is 80 y.o. F presenting to ED on 06/22/21 for progressive LLE weakness.  Found to have severe spinal stenosis.  Currently being managed medically (not surgically) per pt's preference.  PMH includes arthiritis, A fib, DM, and HTN    PT Comments    Pt reports she was up in the chair three hours earlier today.  She is hesitant to attempt standing for fear of pain in her left leg.  We did sit EOB working on her seated UE/LE exercises for >10 mins.  She is hopeful to go to rehab prior to returning home.  I encouraged her to let us know when she is ready to try to stand.  PT will continue to follow acutely for safe mobility progression.   Recommendations for follow up therapy are one component of a multi-disciplinary discharge planning process, led by the attending physician.  Recommendations may be updated based on patient status, additional functional criteria and insurance authorization.  Follow Up Recommendations  Skilled nursing-short term rehab (<3 hours/day)     Assistance Recommended at Discharge Frequent or constant Supervision/Assistance  Equipment Recommendations  Wheelchair (measurements PT);Wheelchair cushion (measurements PT);Hospital bed;Other (comment) (hoyer lift)    Recommendations for Other Services       Precautions / Restrictions Precautions Precautions: Fall Precaution Comments: left leg is weak and painful     Mobility  Bed Mobility Overal bed mobility: Needs Assistance Bed Mobility: Supine to Sit;Sit to Supine     Supine to sit: Min guard;HOB elevated Sit to supine: Mod assist;HOB elevated   General bed mobility comments: Min guard assist for safe transition to EOB.  HOB 30 degrees, pt using bed rail and needed significant extra time to complete, but was able to given the extra time.  Also, less painful in  the legs if she moves them herself.  She tried, but was unsuccessful at lifting her legs back into the bed. Mod assist to lift both back to supine.    Transfers                   General transfer comment: Pt reports she has not been back in bed long and that she sat up for 3 hours today.    Ambulation/Gait                   Stairs             Wheelchair Mobility    Modified Rankin (Stroke Patients Only)       Balance Overall balance assessment: Needs assistance Sitting-balance support: Feet supported;No upper extremity supported Sitting balance-Leahy Scale: Fair Sitting balance - Comments: slight mild posterior lean in sitting, trunk fatigues quickly even with UE support.  Pt reports she has a lift recliner chair at home that she "sometimes" sleeps in overnight. Postural control: Posterior lean                                  Cognition Arousal/Alertness: Awake/alert Behavior During Therapy: WFL for tasks assessed/performed Overall Cognitive Status: Within Functional Limits for tasks assessed                                 General Comments: Pt seems a bit fearful to  attempt standing due to pain and weakness in her left leg.        Exercises General Exercises - Upper Extremity Shoulder Flexion: AROM;Both;10 reps Elbow Flexion: AROM;Both;10 reps General Exercises - Lower Extremity Ankle Circles/Pumps: AROM;Both;10 reps;Seated Long Arc Quad: AROM;Both;10 reps;Seated (significantly more difficult on the L combo of pain and weakness.) Hip ABduction/ADduction: AROM;Both;10 reps;Seated    General Comments        Pertinent Vitals/Pain Pain Assessment: Faces Faces Pain Scale: Hurts even more Pain Location: left leg ankle Pain Descriptors / Indicators: Aching;Grimacing;Guarding Pain Intervention(s): Limited activity within patient's tolerance;Monitored during session;Repositioned    Home Living                           Prior Function            PT Goals (current goals can now be found in the care plan section) Progress towards PT goals: Progressing toward goals    Frequency    Min 2X/week      PT Plan Current plan remains appropriate    Co-evaluation              AM-PAC PT "6 Clicks" Mobility   Outcome Measure  Help needed turning from your back to your side while in a flat bed without using bedrails?: A Lot Help needed moving from lying on your back to sitting on the side of a flat bed without using bedrails?: A Little Help needed moving to and from a bed to a chair (including a wheelchair)?: Total Help needed standing up from a chair using your arms (e.g., wheelchair or bedside chair)?: Total Help needed to walk in hospital room?: Total Help needed climbing 3-5 steps with a railing? : Total 6 Click Score: 9    End of Session   Activity Tolerance: Patient limited by pain Patient left: in bed;with call bell/phone within reach;with bed alarm set   PT Visit Diagnosis: Muscle weakness (generalized) (M62.81);Pain Pain - Right/Left: Left Pain - part of body: Leg     Time: 3149-7026 PT Time Calculation (min) (ACUTE ONLY): 19 min  Charges:  $Therapeutic Activity: 8-22 mins                    Verdene Lennert, PT, DPT  Acute Rehabilitation Ortho Tech Supervisor [phone] pager [phone]) [phone] office

## 2021-06-27 NOTE — Progress Notes (Signed)
Occupational Therapy Treatment Patient Details Name: Kayla [name] MRN: [id number] DOB: [birth year] Today's Date: 06/27/2021   History of present illness Pt is 80 y.o. F presenting to ED on 11/23 for progressive LLE weakness. PMH includes arthiritis, A fib, DM, and HTN   OT comments  Pt set up -max A for ADLs, mod A +2 for bed mobility and transfers. Pt able to perform simulated toilet transfer to chair, mod A +2 for lateral scooting using pad for assist. Pt required verbal cuing for hand placement and use of RLE to help scoot self bed > chair. Pt performed seated grooming task with set up A. Pt continues to be limited by decreased activity tolerance, strength, ROM, balance, and pain at this time, will continue to follow acutely. Recommend SNF at d/c.   Recommendations for follow up therapy are one component of a multi-disciplinary discharge planning process, led by the attending physician.  Recommendations may be updated based on patient status, additional functional criteria and insurance authorization.    Follow Up Recommendations  Skilled nursing-short term rehab (<3 hours/day)    Assistance Recommended at Discharge Intermittent Supervision/Assistance  Equipment Recommendations  Wheelchair (measurements OT);BSC/3in1;Wheelchair cushion (measurements OT)    Recommendations for Other Services PT consult    Precautions / Restrictions Precautions Precautions: Fall Restrictions Weight Bearing Restrictions: No       Mobility Bed Mobility Overal bed mobility: Needs Assistance Bed Mobility: Sidelying to Sit;Supine to Sit     Supine to sit: Mod assist;+2 for safety/equipment;HOB elevated Sit to supine: Mod assist;+2 for safety/equipment;HOB elevated   General bed mobility comments: requires increased time and cuing for hand placement    Transfers Overall transfer level: Needs assistance   Transfers: Bed to chair/wheelchair/BSC            Lateral/Scoot Transfers: Mod  assist;From elevated surface;+2 physical assistance General transfer comment: Pt able to scoot to recliner with pad assist     Balance Overall balance assessment: Needs assistance Sitting-balance support: Bilateral upper extremity supported Sitting balance-Leahy Scale: Fair Sitting balance - Comments: relies on UE support to sit EOB. Postural control: Posterior lean                                 ADL either performed or assessed with clinical judgement   ADL Overall ADL's : Needs assistance/impaired Eating/Feeding: Set up;Bed level;Sitting   Grooming: Oral care;Sitting;Set up;Bed level               Lower Body Dressing: Total assistance;Bed level Lower Body Dressing Details (indicate cue type and reason): don socks lying supine in bed Toilet Transfer: Minimal assistance;Moderate assistance;+2 for physical assistance;Requires drop arm Toilet Transfer Details (indicate cue type and reason): simulated to drop arm recliner         Functional mobility during ADLs: Maximal assistance;+2 for physical assistance      Extremity/Trunk Assessment Upper Extremity Assessment Upper Extremity Assessment: Generalized weakness   Lower Extremity Assessment Lower Extremity Assessment: Defer to PT evaluation        Vision   Vision Assessment?: No apparent visual deficits   Perception     Praxis      Cognition Arousal/Alertness: Awake/alert Behavior During Therapy: WFL for tasks assessed/performed Overall Cognitive Status: Within Functional Limits for tasks assessed  Exercises     Shoulder Instructions       General Comments      Pertinent Vitals/ Pain       Pain Assessment: Faces Pain Score: 2  Faces Pain Scale: Hurts a little bit Pain Location: LLE Pain Descriptors / Indicators: Aching;Grimacing;Guarding Pain Intervention(s): Limited activity within patient's tolerance;Monitored during  session;Repositioned  Home Living                                          Prior Functioning/Environment              Frequency  Min 2X/week        Progress Toward Goals  OT Goals(current goals can now be found in the care plan section)  Progress towards OT goals: Progressing toward goals  Acute Rehab OT Goals Patient Stated Goal: none stated OT Goal Formulation: With patient Time For Goal Achievement: 07/01/21 Potential to Achieve Goals: [place] Discharge plan remains appropriate;Frequency remains appropriate    Co-evaluation                 AM-PAC OT "6 Clicks" Daily Activity     Outcome Measure   Help from another person eating meals?: None Help from another person taking care of personal grooming?: None Help from another person toileting, which includes using toliet, bedpan, or urinal?: A Lot Help from another person bathing (including washing, rinsing, drying)?: A Lot Help from another person to put on and taking off regular upper body clothing?: A Little Help from another person to put on and taking off regular lower body clothing?: Total 6 Click Score: 16    End of Session    OT Visit Diagnosis: Unsteadiness on feet (R26.81);Other abnormalities of gait and mobility (R26.89);Muscle weakness (generalized) (M62.81);Pain   Activity Tolerance Patient tolerated treatment well   Patient Left in chair;with chair alarm set;with call bell/phone within reach   Nurse Communication Mobility status;Other (comment) (Provided RN with instructions on lateral scoot transfer for pt to return to bed)        Time: 1300-1325 OT Time Calculation (min): 25 min  Charges: OT General Charges $OT Visit: 1 Visit OT Treatments $Self Care/Home Management : 23-37 mins  Lynnda Child, OTD, OTR/L Acute Rehab [phone] [place] 06/27/2021, 1:36 PM

## 2021-06-27 NOTE — Progress Notes (Signed)
PROGRESS NOTE    Kayla [name]  ELF:[id number] DOB: 07/19/1941 DOA: 06/22/2021 PCP: Denita Lung, MD   Brief Narrative:  Kayla [name] is a 80 y.o. BF PMHx DM; HTN; A-Fib on Eliquis; Dementia, PE  Obesity presenting with ambulatory dysfunction.  She initially came in for aflutter in August.  The pain got so bad that she couldn't stand up.  She could do for herself but her son was there and she decided she needed to come in to the hospital.  She has been unable to walk progressively since her last hospitalization.  She went to SNF rehab after her last hospitalization and she was doing ok initially.  She was doing ok and then all at once her left leg starting giving her problems.  She denies back pain.  She doesn't move either leg well but the left is worse.  She is confused about what is going on.  She is very upset that her sons are not here and wants to know why I don't know where they are.   I called and attempted to speak with her sons, but was unable to reach either at the time of admission.     She was previously hospitalized from 8/5-16 for new-onset atrial flutter.  She was started on Coreg and Eliquis.  She also had L ankle pain and was started on treatment for gout.  Ambulatory Status:  Ambulated with a walker last month   Subjective: 11/28 afebrile overnight, A/O x4 patient again has significant improvement of lower extremity strength.  Complaining of bilateral foot pain.   Assessment & Plan:  Covid vaccination; vaccinated 1/3.  Requests second vaccination.  11/25 ordered Bivalent vaccination-Coreg 6.25 mg  Principal Problem:   Ambulatory dysfunction Active Problems:   Class 2 obesity due to excess calories with body mass index (BMI) of 37.0 to 37.9 in adult   Hyperlipidemia associated with type 2 diabetes mellitus ([place])   Primary hypertension   Type 2 diabetes mellitus ([facility])   Generalized weakness   Spinal stenosis   Unspecified atrial fibrillation ([place])   Spinal  stenosis of lumbar region at multiple levels   Lumbar nerve root impingement  Generalized weakness/Ambulatory dysfunction -Patient previously admitted with new-onset afib -She was discharged to SNF rehab -She appears to have stabilized while there but has had deterioration while at home -The acuity of that deterioration is unclear, as the patient is a poor historian and her children were not available in person or by telephone -Will observe for now with PT/OT/ST (cognitive evaluation) consults -11/26 patient DOES NOT appear to have dementia.  If dementia is present very very mild   Spinal stenosis/Nerve Root impingement at multiple levels L-spine -Imaging indicates spinal stenosis -It is possible that this is contributing to her presentation -However, she is really having B LE weakness (L > R but that difference is subtle) -Further, I suggested calling neurosurgery to consult and she reported that she is not a surgical candidate and so the consult is not necessary; will not call currently -Per Dr. Karmen Bongo note on 11/24 Dr. Trenton Gammon neurosurgeon was secure chatted -11/25 discussed case at length with Dr. Trenton Gammon neurosurgeon recommends starting patient on course of high-dose steroids.  Believes surgery might be beneficial to patient however patient states does not want surgery.  Dr. Trenton Gammon stated he is going to contact family and discuss findings with them. - 11/25 Solu-Medrol 80 mg daily -11/25 spoke at length with patient concerning spinal stenosis/nerve root impingement patient  emphatically stated right now she does not want surgery.  She did entertain that she would speak with her sons and think it over and give [place] her answer. -11/26 states she has decided she still does not want surgery after speaking with Dr. Trenton Gammon neurosurgeon.  States that she understands implications in regards to her deteriorating condition. -11/27 states she spoke with Dr. Irven [place] apps today and again expressed she  had no desire for surgery but would speak with Dr. Trenton Gammon on 11/28 during his rounds. -11/28 tomorrow will change patient over to p.o. steroids (40 mg) daily -   Afib -Rate control with Coreg 6.25 mg BID -Losartan 50 mg daily -Apixaban 5 mg BID -11/25 currently NSR  HTN -See A. Fib  DM type II controlled without complication -Recent G8J was 6.7, -hold Glucophage -11/25 increase resistant SSI -11/27 Levemir 8 units daily   HLD -Lipitor 80 mg daily -Hold Zetia due to limited inpatient utility  Anemia Lab Results  Component Value Date   HGB 8.1 (L) 06/27/2021   HGB 7.7 (L) 06/26/2021   HGB 7.9 (L) 06/25/2021   HGB 8.5 (L) 06/24/2021   HGB 9.7 (L) 06/22/2021  - 11/26 anemia panel pending -11/27 occult blood pending   Obesity (BMI 37.5 kg/m) -Weight loss should be encouraged -Outpatient PCP/bariatric medicine f/u encouraged   Goals of care - 11/28 PT/OT consult: Recommend SNF  DVT prophylaxis: Apixaban Code Status: Full Family Communication: 11/27 spoke at length to [name] (son) answered all questions Status is: Inpatient    Dispo: The patient is from: Home              Anticipated d/c is to: Home              Anticipated d/c date is: > 3 days              Patient currently is not medically stable to d/c.      Consultants:  Neurosurgery Dr. Trenton Gammon    Procedures/Significant Events:  11/23 CT L-spine W0 contrast  1. L3-L4 and L4-L5 moderate to severe spinal canal stenosis and moderate bilateral neural foraminal narrowing. 2. L2-L3 moderate spinal canal stenosis and mild bilateral neural foraminal narrowing. 11/23 MRI L-spine W0 contrast 1. L3-L4 moderate to severe spinal canal stenosis and mild-to-moderate left neural foraminal narrowing. Effacement of the lateral recesses at this level likely compresses the descending L4 nerves. 2. L2-L3 moderate spinal canal stenosis. Effacement of the lateral recesses at this level likely compresses the descending  L3 nerves. 3. L4-L5 mild spinal canal stenosis and mild left neural foraminal narrowing. Narrowing of the lateral recesses at this level could affect the descending L5 nerves. 4. Narrowing of the lateral recesses at L1-L2 could affect the descending L2 nerves.  I have personally reviewed and interpreted all radiology studies and my findings are as above.  VENTILATOR SETTINGS:    Cultures   Antimicrobials:    Devices    LINES / TUBES:      Continuous Infusions:   Objective: Vitals:   06/26/21 2005 06/27/21 0458 06/27/21 0753 06/27/21 1507  BP: (!) 161/66 (!) 147/75 (!) 174/72 (!) 148/71  Pulse: 72 67 (!) 59 74  Resp: 18 18 17 17   Temp: 97.7 F (36.5 C) 97.6 F (36.4 C) 98 F (36.7 C) 97.8 F (36.6 C)  TempSrc: Oral Oral Oral Oral  SpO2: 97% 98% 98% 97%  Weight:      Height:        Intake/Output Summary (Last  24 hours) at 06/27/2021 1605 Last data filed at 06/27/2021 0900 Gross per 24 hour  Intake 360 ml  Output 500 ml  Net -140 ml    Filed Weights   06/24/21 0451 06/25/21 0624 06/26/21 0348  Weight: 99.3 kg 101.8 kg 98.9 kg    Examination:  General: A/O x4, No acute respiratory distress Eyes: negative scleral hemorrhage, negative anisocoria, negative icterus ENT: Negative Runny nose, negative gingival bleeding, Neck:  Negative scars, masses, torticollis, lymphadenopathy, JVD Lungs: Clear to auscultation bilaterally without wheezes or crackles Cardiovascular: Regular rate and rhythm without murmur gallop or rub normal S1 and S2 Abdomen: OBESE, negative abdominal pain, nondistended, positive soft, bowel sounds, no rebound, no ascites, no appreciable mass Extremities: No significant cyanosis, clubbing, or edema bilateral lower extremities Skin: Negative rashes, lesions, ulcers Psychiatric:  Negative depression, negative anxiety, negative fatigue, negative mania  Central nervous system:  Cranial nerves II through XII intact, tongue/uvula midline,  all extremities muscle strength 5/5, except RLE strength 4/5, LLE strength 3/5 sensation in L LE decreased to soft touch and pinprick.  negative dysarthria, negative expressive aphasia, negative receptive aphasia.  .     Data Reviewed: Care during the described time interval was provided by me .  I have reviewed this patient's available data, including medical history, events of note, physical examination, and all test results as part of my evaluation.   CBC: Recent Labs  Lab 06/22/21 1712 06/24/21 0926 06/25/21 0140 06/26/21 0123 06/27/21 0254  WBC 7.1 7.1 7.1 10.5 9.3  NEUTROABS 5.1 4.7 6.2 9.1* 7.9*  HGB 9.7* 8.5* 7.9* 7.7* 8.1*  HCT 30.7* 27.1* 24.6* 24.9* 25.7*  MCV 83.2 82.4 80.9 81.4 81.3  PLT 328 308 314 345 937    Basic Metabolic Panel: Recent Labs  Lab 06/22/21 1712 06/24/21 0926 06/25/21 0140 06/26/21 0123 06/27/21 0254  NA 133* 134* 134* 132* 137  K 4.5 4.3 4.4 4.4 4.8  CL 98 101 102 101 106  CO2 25 25 26 24 26   GLUCOSE 124* 114* 141* 165* 143*  BUN 21 16 23 22  24*  CREATININE 1.03* 0.88 0.96 0.88 0.76  CALCIUM 9.0 8.3* 8.3* 8.1* 8.2*  MG  --  1.6* 1.9 1.8 2.0  PHOS  --  2.9 2.7 2.3* 2.6    GFR: Estimated Creatinine Clearance: 62 mL/min (by C-G formula based on SCr of 0.76 mg/dL). Liver Function Tests: Recent Labs  Lab 06/22/21 1712 06/24/21 0926 06/25/21 0140 06/26/21 0123 06/27/21 0254  AST 18 15 14* 29 24  ALT 11 10 14 25 29   ALKPHOS 87 62 73 68 69  BILITOT 0.8 0.5 0.5 0.3 0.5  PROT 7.9 6.2* 6.3* 6.1* 5.9*  ALBUMIN 3.0* 2.3* 2.2* 2.1* 2.0*    No results for input(s): LIPASE, AMYLASE in the last 168 hours. No results for input(s): AMMONIA in the last 168 hours. Coagulation Profile: No results for input(s): INR, PROTIME in the last 168 hours. Cardiac Enzymes: No results for input(s): CKTOTAL, CKMB, CKMBINDEX, TROPONINI in the last 168 hours. BNP (last 3 results) No results for input(s): PROBNP in the last 8760 hours. HbA1C: No  results for input(s): HGBA1C in the last 72 hours.  CBG: Recent Labs  Lab 06/26/21 1956 06/27/21 0015 06/27/21 0456 06/27/21 0813 06/27/21 1143  GLUCAP 231* 139* 118* 105* 124*    Lipid Profile: No results for input(s): CHOL, HDL, LDLCALC, TRIG, CHOLHDL, LDLDIRECT in the last 72 hours.  Thyroid Function Tests: No results for input(s): TSH, T4TOTAL, FREET4, T3FREE,  THYROIDAB in the last 72 hours.  Anemia Panel: Recent Labs    06/26/21 0123  VITAMINB12 224  FOLATE 7.3  FERRITIN 72  TIBC 211*  IRON 42  RETICCTPCT 1.1    Urine analysis:    Component Value Date/Time   COLORURINE YELLOW 06/22/2021 2113   APPEARANCEUR CLEAR 06/22/2021 2113   LABSPEC 1.014 06/22/2021 2113   PHURINE 5.0 06/22/2021 2113   GLUCOSEU NEGATIVE 06/22/2021 2113   HGBUR NEGATIVE 06/22/2021 2113   BILIRUBINUR NEGATIVE 06/22/2021 2113   BILIRUBINUR n 11/27/2016 1129   KETONESUR NEGATIVE 06/22/2021 2113   PROTEINUR NEGATIVE 06/22/2021 2113   UROBILINOGEN negative (A) 11/27/2016 1129   UROBILINOGEN 0.2 12/01/2010 2352   NITRITE NEGATIVE 06/22/2021 2113   LEUKOCYTESUR NEGATIVE 06/22/2021 2113   Sepsis Labs: @LABRCNTIP (procalcitonin:4,lacticidven:4)  ) Recent Results (from the past 240 hour(s))  Resp Panel by RT-PCR (Flu A&B, Covid)     Status: None   Collection Time: 06/22/21  8:20 PM   Specimen: Nasopharyngeal(NP) swabs in vial transport medium  Result Value Ref Range Status   SARS Coronavirus 2 by RT PCR NEGATIVE NEGATIVE Final    Comment: (NOTE) SARS-CoV-2 target nucleic acids are NOT DETECTED.  The SARS-CoV-2 RNA is generally detectable in upper respiratory specimens during the acute phase of infection. The lowest concentration of SARS-CoV-2 viral copies this assay can detect is 138 copies/mL. A negative result does not preclude SARS-Cov-2 infection and should not be used as the sole basis for treatment or other patient management decisions. A negative result may occur with  improper  specimen collection/handling, submission of specimen other than nasopharyngeal swab, presence of viral mutation(s) within the areas targeted by this assay, and inadequate number of viral copies(<138 copies/mL). A negative result must be combined with clinical observations, patient history, and epidemiological information. The expected result is Negative.  Fact Sheet for Patients:  EntrepreneurPulse.com.au  Fact Sheet for Healthcare Providers:  IncredibleEmployment.be  This test is no t yet approved or cleared by the [place] FDA and  has been authorized for detection and/or diagnosis of SARS-CoV-2 by FDA under an Emergency Use Authorization (EUA). This EUA will remain  in effect (meaning this test can be used) for the duration of the COVID-19 declaration under Section 564(b)(1) of the Act, 21 U.S.C.section 360bbb-3(b)(1), unless the authorization is terminated  or revoked sooner.       Influenza A by PCR NEGATIVE NEGATIVE Final   Influenza B by PCR NEGATIVE NEGATIVE Final    Comment: (NOTE) The Xpert Xpress SARS-CoV-2/FLU/RSV plus assay is intended as an aid in the diagnosis of influenza from Nasopharyngeal swab specimens and should not be used as a sole basis for treatment. Nasal washings and aspirates are unacceptable for Xpert Xpress SARS-CoV-2/FLU/RSV testing.  Fact Sheet for Patients: EntrepreneurPulse.com.au  Fact Sheet for Healthcare Providers: IncredibleEmployment.be  This test is not yet approved or cleared by the [place] FDA and has been authorized for detection and/or diagnosis of SARS-CoV-2 by FDA under an Emergency Use Authorization (EUA). This EUA will remain in effect (meaning this test can be used) for the duration of the COVID-19 declaration under Section 564(b)(1) of the Act, 21 U.S.C. section 360bbb-3(b)(1), unless the authorization is terminated or revoked.  Performed at  [facility], [place] [address], [place], [place] [address]          Radiology Studies: No results found.      Scheduled Meds:  apixaban  5 mg Oral BID   atorvastatin  80 mg  Oral QHS   carvedilol  6.25 mg Oral BID   COVID-19 mRNA bivalent vaccine (Pfizer)  0.3 mL Intramuscular Once   feeding supplement (GLUCERNA SHAKE)  237 mL Oral BID BM   gabapentin  100 mg Oral TID   insulin aspart  0-20 Units Subcutaneous Q4H   insulin detemir  8 Units Subcutaneous Daily   losartan  50 mg Oral Daily   methylPREDNISolone (SOLU-MEDROL) injection  80 mg Intravenous Q24H   multivitamin with minerals  1 tablet Oral Daily   Continuous Infusions:   LOS: 3 days   The patient is critically ill with multiple organ systems failure and requires high complexity decision making for assessment and support, frequent evaluation and titration of therapies, application of advanced monitoring technologies and extensive interpretation of multiple databases. Critical Care Time devoted to patient care services described in this note  Time spent: 40 minutes     Yandriel Boening, Geraldo Docker, MD [facility]   If 7PM-7AM, please contact night-coverage 06/27/2021, 4:05 PM

## 2021-06-28 ENCOUNTER — Ambulatory Visit: Payer: Medicare PPO | Admitting: Family Medicine

## 2021-06-28 DIAGNOSIS — R531 Weakness: Secondary | ICD-10-CM | POA: Diagnosis not present

## 2021-06-28 DIAGNOSIS — E1169 Type 2 diabetes mellitus with other specified complication: Secondary | ICD-10-CM | POA: Diagnosis not present

## 2021-06-28 DIAGNOSIS — R262 Difficulty in walking, not elsewhere classified: Secondary | ICD-10-CM | POA: Diagnosis not present

## 2021-06-28 LAB — PHOSPHORUS: Phosphorus: 2.7 mg/dL (ref 2.5–4.6)

## 2021-06-28 LAB — COMPREHENSIVE METABOLIC PANEL
ALT: 30 U/L (ref 0–44)
AST: 21 U/L (ref 15–41)
Albumin: 2.1 g/dL — ABNORMAL LOW (ref 3.5–5.0)
Alkaline Phosphatase: 73 U/L (ref 38–126)
Anion gap: 5 (ref 5–15)
BUN: 25 mg/dL — ABNORMAL HIGH (ref 8–23)
CO2: 26 mmol/L (ref 22–32)
Calcium: 8.3 mg/dL — ABNORMAL LOW (ref 8.9–10.3)
Chloride: 105 mmol/L (ref 98–111)
Creatinine, Ser: 0.77 mg/dL (ref 0.44–1.00)
GFR, Estimated: >60 mL/min (ref >60)
Glucose, Bld: 143 mg/dL — ABNORMAL HIGH (ref 70–99)
Potassium: 4.9 mmol/L (ref 3.5–5.1)
Sodium: 136 mmol/L (ref 135–145)
Total Bilirubin: 0.4 mg/dL (ref 0.3–1.2)
Total Protein: 5.9 g/dL — ABNORMAL LOW (ref 6.5–8.1)

## 2021-06-28 LAB — GLUCOSE, CAPILLARY
Glucose-Capillary: 100 mg/dL — ABNORMAL HIGH (ref 70–99)
Glucose-Capillary: 153 mg/dL — ABNORMAL HIGH (ref 70–99)
Glucose-Capillary: 239 mg/dL — ABNORMAL HIGH (ref 70–99)
Glucose-Capillary: 276 mg/dL — ABNORMAL HIGH (ref 70–99)
Glucose-Capillary: 305 mg/dL — ABNORMAL HIGH (ref 70–99)
Glucose-Capillary: 77 mg/dL (ref 70–99)
Glucose-Capillary: 95 mg/dL (ref 70–99)

## 2021-06-28 LAB — CBC WITH DIFFERENTIAL/PLATELET
Abs Immature Granulocytes: 0.06 10*3/uL (ref 0.00–0.07)
Basophils Absolute: 0 10*3/uL (ref 0.0–0.1)
Basophils Relative: 0 %
Eosinophils Absolute: 0 10*3/uL (ref 0.0–0.5)
Eosinophils Relative: 0 %
HCT: 27.3 % — ABNORMAL LOW (ref 36.0–46.0)
Hemoglobin: 8.4 g/dL — ABNORMAL LOW (ref 12.0–15.0)
Immature Granulocytes: 1 %
Lymphocytes Relative: 12 %
Lymphs Abs: 1.2 10*3/uL (ref 0.7–4.0)
MCH: 25.1 pg — ABNORMAL LOW (ref 26.0–34.0)
MCHC: 30.8 g/dL (ref 30.0–36.0)
MCV: 81.5 fL (ref 80.0–100.0)
Monocytes Absolute: 0.6 10*3/uL (ref 0.1–1.0)
Monocytes Relative: 6 %
Neutro Abs: 7.7 10*3/uL (ref 1.7–7.7)
Neutrophils Relative %: 81 %
Platelets: 378 10*3/uL (ref 150–400)
RBC: 3.35 MIL/uL — ABNORMAL LOW (ref 3.87–5.11)
RDW: 14.3 % (ref 11.5–15.5)
WBC: 9.6 10*3/uL (ref 4.0–10.5)
nRBC: 0 % (ref 0.0–0.2)

## 2021-06-28 LAB — MAGNESIUM: Magnesium: 2 mg/dL (ref 1.7–2.4)

## 2021-06-28 MED ORDER — GABAPENTIN 100 MG PO CAPS
200.0000 mg | ORAL_CAPSULE | Freq: Three times a day (TID) | ORAL | Status: DC
  Administered 2021-06-28 – 2021-06-30 (×6): 200 mg via ORAL
  Filled 2021-06-28 (×4): qty 2

## 2021-06-28 MED ORDER — PREDNISONE 20 MG PO TABS
40.0000 mg | ORAL_TABLET | Freq: Every day | ORAL | Status: DC
  Administered 2021-06-29 – 2021-06-30 (×2): 40 mg via ORAL
  Filled 2021-06-28 (×2): qty 2

## 2021-06-28 NOTE — Progress Notes (Signed)
PROGRESS NOTE    Kayla [name]  JGG:[id number] DOB: 10-25-1940 DOA: 06/22/2021 PCP: Denita Lung, MD   Brief Narrative:  Kayla [name] is a 80 y.o. BF PMHx DM; HTN; A-Fib on Eliquis; Dementia, PE  Obesity presenting with ambulatory dysfunction.  She initially came in for aflutter in August.  The pain got so bad that she couldn't stand up.  She could do for herself but her son was there and she decided she needed to come in to the hospital.  She has been unable to walk progressively since her last hospitalization.  She went to SNF rehab after her last hospitalization and she was doing ok initially.  She was doing ok and then all at once her left leg starting giving her problems.  She denies back pain.  She doesn't move either leg well but the left is worse.  She is confused about what is going on.  She is very upset that her sons are not here and wants to know why I don't know where they are.   I called and attempted to speak with her sons, but was unable to reach either at the time of admission.     She was previously hospitalized from 8/5-16 for new-onset atrial flutter.  She was started on Coreg and Eliquis.  She also had L ankle pain and was started on treatment for gout.  Ambulatory Status:  Ambulated with a walker last month   Subjective: 11/29 A/O x4, patient continues to have slow improvement in lower extremity strength and pain.  Today having new left heel pain RN placed foam padding.   Assessment & Plan:  Covid vaccination; vaccinated 1/3.  Requests second vaccination.  11/25 ordered Bivalent vaccination-Coreg 6.25 mg  Principal Problem:   Ambulatory dysfunction Active Problems:   Class 2 obesity due to excess calories with body mass index (BMI) of 37.0 to 37.9 in adult   Hyperlipidemia associated with type 2 diabetes mellitus ([place])   Primary hypertension   Type 2 diabetes mellitus (HCC)   Generalized weakness   Spinal stenosis   Unspecified atrial fibrillation  ([place])   Spinal stenosis of lumbar region at multiple levels   Lumbar nerve root impingement  Generalized weakness/Ambulatory dysfunction -Patient previously admitted with new-onset afib -She was discharged to SNF rehab -She appears to have stabilized while there but has had deterioration while at home -The acuity of that deterioration is unclear, as the patient is a poor historian and her children were not available in person or by telephone -Will observe for now with PT/OT/ST (cognitive evaluation) consults -11/26 patient DOES NOT appear to have dementia.  If dementia is present very very mild   Spinal stenosis/Nerve Root impingement at multiple levels L-spine -Imaging indicates spinal stenosis -It is possible that this is contributing to her presentation -However, she is really having B LE weakness (L > R but that difference is subtle) -Further, I suggested calling neurosurgery to consult and she reported that she is not a surgical candidate and so the consult is not necessary; will not call currently -Per Dr. Karmen Bongo note on 11/24 Dr. Trenton Gammon neurosurgeon was secure chatted -11/25 discussed case at length with Dr. Trenton Gammon neurosurgeon recommends starting patient on course of high-dose steroids.  Believes surgery might be beneficial to patient however patient states does not want surgery.  Dr. Trenton Gammon stated he is going to contact family and discuss findings with them. - 11/25 Solu-Medrol 80 mg daily -11/25 spoke at length with patient  concerning spinal stenosis/nerve root impingement patient emphatically stated right now she does not want surgery.  She did entertain that she would speak with her sons and think it over and give [place] her answer. -11/26 states she has decided she still does not want surgery after speaking with Dr. Trenton Gammon neurosurgeon.  States that she understands implications in regards to her deteriorating condition. -11/27 states she spoke with Dr. Irven [place] apps today and again  expressed she had no desire for surgery but would speak with Dr. Trenton Gammon on 11/28 during his rounds. - 11/29 DC Solu-Medrol----> Prednisone 40 mg daily.  Patient is receiving benefit from steroids therefore would taper slowly over time.  May need to remain on low-dose steroids over time. - 11/29 upon discharge from SNF would refer to pain clinic in [place] Neurosurgery -11/29 increase Gabapentin 200 mg TID -11/29 May want to start patient on a low-dose narcotics such as tramadol.  Have held off to date   Afib -Rate control with Coreg 6.25 mg BID -Losartan 50 mg daily -Apixaban 5 mg BID -11/25 currently NSR  HTN -See A. Fib  DM type II controlled without complication -Recent G3T was 6.7, Lab Results  Component Value Date   GLUCOSE 143 (H) 06/28/2021   GLUCOSE 143 (H) 06/27/2021   GLUCOSE 165 (H) 06/26/2021   GLUCOSE 141 (H) 06/25/2021   GLUCOSE 114 (H) 06/24/2021  -hold Glucophage -11/25 increase resistant SSI -11/27 Levemir 8 units daily   HLD -Lipitor 80 mg daily -Hold Zetia due to limited inpatient utility  Anemia Lab Results  Component Value Date   HGB 8.4 (L) 06/28/2021   HGB 8.1 (L) 06/27/2021   HGB 7.7 (L) 06/26/2021   HGB 7.9 (L) 06/25/2021   HGB 8.5 (L) 06/24/2021  - 11/26 anemia panel pending -11/27 occult blood pending   Obesity (BMI 37.5 kg/m) -Weight loss should be encouraged -Outpatient PCP/bariatric medicine f/u encouraged   Goals of care - 11/28 PT/OT consult: Recommend SNF  DVT prophylaxis: Apixaban Code Status: Full Family Communication: 11/27 spoke at length to [name] (son) answered all questions Status is: Inpatient    Dispo: The patient is from: Home              Anticipated d/c is to: Home              Anticipated d/c date is: > 3 days              Patient currently is not medically stable to d/c.      Consultants:  Neurosurgery Dr. Trenton Gammon    Procedures/Significant Events:  11/23 CT L-spine W0 contrast  1. L3-L4 and L4-L5  moderate to severe spinal canal stenosis and moderate bilateral neural foraminal narrowing. 2. L2-L3 moderate spinal canal stenosis and mild bilateral neural foraminal narrowing. 11/23 MRI L-spine W0 contrast 1. L3-L4 moderate to severe spinal canal stenosis and mild-to-moderate left neural foraminal narrowing. Effacement of the lateral recesses at this level likely compresses the descending L4 nerves. 2. L2-L3 moderate spinal canal stenosis. Effacement of the lateral recesses at this level likely compresses the descending L3 nerves. 3. L4-L5 mild spinal canal stenosis and mild left neural foraminal narrowing. Narrowing of the lateral recesses at this level could affect the descending L5 nerves. 4. Narrowing of the lateral recesses at L1-L2 could affect the descending L2 nerves.  I have personally reviewed and interpreted all radiology studies and my findings are as above.  VENTILATOR SETTINGS:    Cultures   Antimicrobials:  Devices    LINES / TUBES:      Continuous Infusions:   Objective: Vitals:   06/27/21 1507 06/27/21 2008 06/28/21 0500 06/28/21 0700  BP: (!) 148/71 (!) 146/64  (!) 176/69  Pulse: 74 75  67  Resp: 17 16  18   Temp: 97.8 F (36.6 C) 98 F (36.7 C)  97.8 F (36.6 C)  TempSrc: Oral Oral  Oral  SpO2: 97% 98%  98%  Weight:   100.1 kg   Height:        Intake/Output Summary (Last 24 hours) at 06/28/2021 1323 Last data filed at 06/28/2021 0900 Gross per 24 hour  Intake 480 ml  Output 1400 ml  Net -920 ml    Filed Weights   06/25/21 0624 06/26/21 0348 06/28/21 0500  Weight: 101.8 kg 98.9 kg 100.1 kg    Examination:  General: A/O x4, No acute respiratory distress Eyes: negative scleral hemorrhage, negative anisocoria, negative icterus ENT: Negative Runny nose, negative gingival bleeding, Neck:  Negative scars, masses, torticollis, lymphadenopathy, JVD Lungs: Clear to auscultation bilaterally without wheezes or  crackles Cardiovascular: Regular rate and rhythm without murmur gallop or rub normal S1 and S2 Abdomen: OBESE, negative abdominal pain, nondistended, positive soft, bowel sounds, no rebound, no ascites, no appreciable mass Extremities: No significant cyanosis, clubbing, or edema bilateral lower extremities Skin: Negative rashes, lesions, ulcers Psychiatric:  Negative depression, negative anxiety, negative fatigue, negative mania  Central nervous system:  Cranial nerves II through XII intact, tongue/uvula midline, all extremities muscle strength 5/5, except RLE strength 4/5, LLE strength 4/5 sensation in L LE decreased to soft touch and pinprick.  negative dysarthria, negative expressive aphasia, negative receptive aphasia.  .     Data Reviewed: Care during the described time interval was provided by me .  I have reviewed this patient's available data, including medical history, events of note, physical examination, and all test results as part of my evaluation.   CBC: Recent Labs  Lab 06/24/21 0926 06/25/21 0140 06/26/21 0123 06/27/21 0254 06/28/21 0059  WBC 7.1 7.1 10.5 9.3 9.6  NEUTROABS 4.7 6.2 9.1* 7.9* 7.7  HGB 8.5* 7.9* 7.7* 8.1* 8.4*  HCT 27.1* 24.6* 24.9* 25.7* 27.3*  MCV 82.4 80.9 81.4 81.3 81.5  PLT 308 314 345 346 466    Basic Metabolic Panel: Recent Labs  Lab 06/24/21 0926 06/25/21 0140 06/26/21 0123 06/27/21 0254 06/28/21 0059  NA 134* 134* 132* 137 136  K 4.3 4.4 4.4 4.8 4.9  CL 101 102 101 106 105  CO2 25 26 24 26 26   GLUCOSE 114* 141* 165* 143* 143*  BUN 16 23 22  24* 25*  CREATININE 0.88 0.96 0.88 0.76 0.77  CALCIUM 8.3* 8.3* 8.1* 8.2* 8.3*  MG 1.6* 1.9 1.8 2.0 2.0  PHOS 2.9 2.7 2.3* 2.6 2.7    GFR: Estimated Creatinine Clearance: 62.4 mL/min (by C-G formula based on SCr of 0.77 mg/dL). Liver Function Tests: Recent Labs  Lab 06/24/21 0926 06/25/21 0140 06/26/21 0123 06/27/21 0254 06/28/21 0059  AST 15 14* 29 24 21   ALT 10 14 25 29 30    ALKPHOS 62 73 68 69 73  BILITOT 0.5 0.5 0.3 0.5 0.4  PROT 6.2* 6.3* 6.1* 5.9* 5.9*  ALBUMIN 2.3* 2.2* 2.1* 2.0* 2.1*    No results for input(s): LIPASE, AMYLASE in the last 168 hours. No results for input(s): AMMONIA in the last 168 hours. Coagulation Profile: No results for input(s): INR, PROTIME in the last 168 hours. Cardiac Enzymes: No results  for input(s): CKTOTAL, CKMB, CKMBINDEX, TROPONINI in the last 168 hours. BNP (last 3 results) No results for input(s): PROBNP in the last 8760 hours. HbA1C: No results for input(s): HGBA1C in the last 72 hours.  CBG: Recent Labs  Lab 06/27/21 1943 06/28/21 0020 06/28/21 0415 06/28/21 0843 06/28/21 1215  GLUCAP 242* 153* 95 77 100*    Lipid Profile: No results for input(s): CHOL, HDL, LDLCALC, TRIG, CHOLHDL, LDLDIRECT in the last 72 hours.  Thyroid Function Tests: No results for input(s): TSH, T4TOTAL, FREET4, T3FREE, THYROIDAB in the last 72 hours.  Anemia Panel: Recent Labs    06/26/21 0123  VITAMINB12 224  FOLATE 7.3  FERRITIN 72  TIBC 211*  IRON 42  RETICCTPCT 1.1    Urine analysis:    Component Value Date/Time   COLORURINE YELLOW 06/22/2021 2113   APPEARANCEUR CLEAR 06/22/2021 2113   LABSPEC 1.014 06/22/2021 2113   PHURINE 5.0 06/22/2021 2113   GLUCOSEU NEGATIVE 06/22/2021 2113   HGBUR NEGATIVE 06/22/2021 2113   BILIRUBINUR NEGATIVE 06/22/2021 2113   BILIRUBINUR n 11/27/2016 1129   KETONESUR NEGATIVE 06/22/2021 2113   PROTEINUR NEGATIVE 06/22/2021 2113   UROBILINOGEN negative (A) 11/27/2016 1129   UROBILINOGEN 0.2 12/01/2010 2352   NITRITE NEGATIVE 06/22/2021 2113   LEUKOCYTESUR NEGATIVE 06/22/2021 2113   Sepsis Labs: @LABRCNTIP (procalcitonin:4,lacticidven:4)  ) Recent Results (from the past 240 hour(s))  Resp Panel by RT-PCR (Flu A&B, Covid)     Status: None   Collection Time: 06/22/21  8:20 PM   Specimen: Nasopharyngeal(NP) swabs in vial transport medium  Result Value Ref Range Status   SARS  Coronavirus 2 by RT PCR NEGATIVE NEGATIVE Final    Comment: (NOTE) SARS-CoV-2 target nucleic acids are NOT DETECTED.  The SARS-CoV-2 RNA is generally detectable in upper respiratory specimens during the acute phase of infection. The lowest concentration of SARS-CoV-2 viral copies this assay can detect is 138 copies/mL. A negative result does not preclude SARS-Cov-2 infection and should not be used as the sole basis for treatment or other patient management decisions. A negative result may occur with  improper specimen collection/handling, submission of specimen other than nasopharyngeal swab, presence of viral mutation(s) within the areas targeted by this assay, and inadequate number of viral copies(<138 copies/mL). A negative result must be combined with clinical observations, patient history, and epidemiological information. The expected result is Negative.  Fact Sheet for Patients:  EntrepreneurPulse.com.au  Fact Sheet for Healthcare Providers:  IncredibleEmployment.be  This test is no t yet approved or cleared by the [place] FDA and  has been authorized for detection and/or diagnosis of SARS-CoV-2 by FDA under an Emergency Use Authorization (EUA). This EUA will remain  in effect (meaning this test can be used) for the duration of the COVID-19 declaration under Section 564(b)(1) of the Act, 21 U.S.C.section 360bbb-3(b)(1), unless the authorization is terminated  or revoked sooner.       Influenza A by PCR NEGATIVE NEGATIVE Final   Influenza B by PCR NEGATIVE NEGATIVE Final    Comment: (NOTE) The Xpert Xpress SARS-CoV-2/FLU/RSV plus assay is intended as an aid in the diagnosis of influenza from Nasopharyngeal swab specimens and should not be used as a sole basis for treatment. Nasal washings and aspirates are unacceptable for Xpert Xpress SARS-CoV-2/FLU/RSV testing.  Fact Sheet for  Patients: EntrepreneurPulse.com.au  Fact Sheet for Healthcare Providers: IncredibleEmployment.be  This test is not yet approved or cleared by the [place] FDA and has been authorized for detection and/or diagnosis of SARS-CoV-2 by  FDA under an Emergency Use Authorization (EUA). This EUA will remain in effect (meaning this test can be used) for the duration of the COVID-19 declaration under Section 564(b)(1) of the Act, 21 U.S.C. section 360bbb-3(b)(1), unless the authorization is terminated or revoked.  Performed at [facility], [place] [address], [place], [place] [address]          Radiology Studies: No results found.      Scheduled Meds:  apixaban  5 mg Oral BID   atorvastatin  80 mg Oral QHS   carvedilol  6.25 mg Oral BID   COVID-19 mRNA bivalent vaccine (Pfizer)  0.3 mL Intramuscular Once   feeding supplement (GLUCERNA SHAKE)  237 mL Oral BID BM   gabapentin  100 mg Oral TID   insulin aspart  0-20 Units Subcutaneous Q4H   insulin detemir  8 Units Subcutaneous Daily   losartan  50 mg Oral Daily   methylPREDNISolone (SOLU-MEDROL) injection  80 mg Intravenous Q24H   multivitamin with minerals  1 tablet Oral Daily   Continuous Infusions:   LOS: 4 days   The patient is critically ill with multiple organ systems failure and requires high complexity decision making for assessment and support, frequent evaluation and titration of therapies, application of advanced monitoring technologies and extensive interpretation of multiple databases. Critical Care Time devoted to patient care services described in this note  Time spent: 40 minutes     Bemnet Trovato, Geraldo Docker, MD [facility]   If 7PM-7AM, please contact night-coverage 06/28/2021, 1:23 PM

## 2021-06-29 DIAGNOSIS — R262 Difficulty in walking, not elsewhere classified: Secondary | ICD-10-CM | POA: Diagnosis not present

## 2021-06-29 DIAGNOSIS — E11618 Type 2 diabetes mellitus with other diabetic arthropathy: Secondary | ICD-10-CM

## 2021-06-29 DIAGNOSIS — M5416 Radiculopathy, lumbar region: Secondary | ICD-10-CM | POA: Diagnosis not present

## 2021-06-29 DIAGNOSIS — E1169 Type 2 diabetes mellitus with other specified complication: Secondary | ICD-10-CM | POA: Diagnosis not present

## 2021-06-29 LAB — GLUCOSE, CAPILLARY
Glucose-Capillary: 114 mg/dL — ABNORMAL HIGH (ref 70–99)
Glucose-Capillary: 118 mg/dL — ABNORMAL HIGH (ref 70–99)
Glucose-Capillary: 120 mg/dL — ABNORMAL HIGH (ref 70–99)
Glucose-Capillary: 204 mg/dL — ABNORMAL HIGH (ref 70–99)
Glucose-Capillary: 241 mg/dL — ABNORMAL HIGH (ref 70–99)
Glucose-Capillary: 92 mg/dL (ref 70–99)

## 2021-06-29 LAB — CBC WITH DIFFERENTIAL/PLATELET
Abs Immature Granulocytes: 0.14 10*3/uL — ABNORMAL HIGH (ref 0.00–0.07)
Basophils Absolute: 0 10*3/uL (ref 0.0–0.1)
Basophils Relative: 0 %
Eosinophils Absolute: 0 10*3/uL (ref 0.0–0.5)
Eosinophils Relative: 0 %
HCT: 27.7 % — ABNORMAL LOW (ref 36.0–46.0)
Hemoglobin: 8.8 g/dL — ABNORMAL LOW (ref 12.0–15.0)
Immature Granulocytes: 1 %
Lymphocytes Relative: 14 %
Lymphs Abs: 1.6 10*3/uL (ref 0.7–4.0)
MCH: 25.9 pg — ABNORMAL LOW (ref 26.0–34.0)
MCHC: 31.8 g/dL (ref 30.0–36.0)
MCV: 81.5 fL (ref 80.0–100.0)
Monocytes Absolute: 0.7 10*3/uL (ref 0.1–1.0)
Monocytes Relative: 6 %
Neutro Abs: 8.9 10*3/uL — ABNORMAL HIGH (ref 1.7–7.7)
Neutrophils Relative %: 79 %
Platelets: 374 10*3/uL (ref 150–400)
RBC: 3.4 MIL/uL — ABNORMAL LOW (ref 3.87–5.11)
RDW: 14.6 % (ref 11.5–15.5)
WBC: 11.4 10*3/uL — ABNORMAL HIGH (ref 4.0–10.5)
nRBC: 0 % (ref 0.0–0.2)

## 2021-06-29 LAB — COMPREHENSIVE METABOLIC PANEL
ALT: 31 U/L (ref 0–44)
AST: 18 U/L (ref 15–41)
Albumin: 2 g/dL — ABNORMAL LOW (ref 3.5–5.0)
Alkaline Phosphatase: 67 U/L (ref 38–126)
Anion gap: 4 — ABNORMAL LOW (ref 5–15)
BUN: 26 mg/dL — ABNORMAL HIGH (ref 8–23)
CO2: 26 mmol/L (ref 22–32)
Calcium: 8.3 mg/dL — ABNORMAL LOW (ref 8.9–10.3)
Chloride: 104 mmol/L (ref 98–111)
Creatinine, Ser: 0.84 mg/dL (ref 0.44–1.00)
GFR, Estimated: >60 mL/min (ref >60)
Glucose, Bld: 123 mg/dL — ABNORMAL HIGH (ref 70–99)
Potassium: 4.9 mmol/L (ref 3.5–5.1)
Sodium: 134 mmol/L — ABNORMAL LOW (ref 135–145)
Total Bilirubin: 0.6 mg/dL (ref 0.3–1.2)
Total Protein: 5.8 g/dL — ABNORMAL LOW (ref 6.5–8.1)

## 2021-06-29 LAB — PHOSPHORUS: Phosphorus: 3.2 mg/dL (ref 2.5–4.6)

## 2021-06-29 LAB — MAGNESIUM: Magnesium: 2.1 mg/dL (ref 1.7–2.4)

## 2021-06-29 MED ORDER — INSULIN ASPART 100 UNIT/ML IJ SOLN
3.0000 [IU] | Freq: Three times a day (TID) | INTRAMUSCULAR | Status: DC
  Administered 2021-06-29 – 2021-06-30 (×4): 3 [IU] via SUBCUTANEOUS

## 2021-06-29 MED ORDER — INSULIN ASPART 100 UNIT/ML IJ SOLN
0.0000 [IU] | Freq: Three times a day (TID) | INTRAMUSCULAR | Status: DC
  Administered 2021-06-29: 3 [IU] via SUBCUTANEOUS
  Administered 2021-06-30 (×2): 1 [IU] via SUBCUTANEOUS

## 2021-06-29 MED ORDER — INSULIN ASPART 100 UNIT/ML IJ SOLN
0.0000 [IU] | Freq: Every day | INTRAMUSCULAR | Status: DC
  Administered 2021-06-29: 2 [IU] via SUBCUTANEOUS

## 2021-06-29 NOTE — TOC Progression Note (Addendum)
Transition of Care [facility]) - Progression Note    Patient Details  Name: CLARINE ELROD MRN: [id number] Date of Birth: 1940-12-03  Transition of [facility] CM/SW Contact  Sharin Mons, RN Phone Number: 06/29/2021, 3:41 PM  Clinical Narrative:    SNF bed offers shared with pt and son [name]. Pt wanted [facility], [facility] without bed availability. States no beds until ? next week. Pt states not interested in [facility] or [place], not sure about [facility]. Son to f/u with [facility] admission. States he will f/u [facility] team once he has reviewed and spoken to admissions @ [facility]. Karren Newland (Son)      [phone]      [facility] team will continue to monitor and assist with needs...   06/29/2021 @ 1600 SNF bed acceptance with [facility] selected by pt. NCM called and left voice message with [facility], awaiting call back with possible bed offer.   Expected Discharge Plan: [place] Barriers to Discharge: Continued Medical Work up  Expected Discharge Plan and Services Expected Discharge Plan: [place]   Discharge Planning Services: CM Consult   Living arrangements for the past 2 months: [place]:  Jackquline Denmark active for home health, [facility] provided by family on Monday and Thursday)         Social Determinants of Health (SDOH) Interventions    Readmission Risk Interventions No flowsheet data found.

## 2021-06-29 NOTE — Progress Notes (Signed)
[facility] PROGRESS NOTE    Progress Note  Kayla [name]  HDQ:[id number] DOB: April 24, 1941 DOA: 06/22/2021 PCP: Denita Lung, MD     Brief Narrative:   Kayla [name] is an 80 y.o. female past medical history of diabetes, hypertension, atrial fibrillation on Eliquis, PE presented with ambulatory dysfunction she relates she has been able to walk progressively getting worse since last hospitalization went to SNF and was doing okay after being at home she started having problems with her left leg she denies any back pain.   Assessment/Plan:   Spinal stenosis/nerve root impingement at multiple level in the L-spine: Dr. Ethelene Hal recommended starting the patient on steroids.  As she is not a surgical candidate at this per previous note. He believes that surgery might be beneficial for the patient however the patient refuses surgery several times after speaking with her family on the neurosurgeon. She will need to be referred to the pain clinic at [facility] neurosurgery. Currently on Neurontin, prednisone and with narcotics.  Generalized weakness/ Ambulatory dysfunction Possibly due to spinal stenosis impinging multiple lumbar levels. Physical therapy and Occupational Therapy have been consulted and recommended skilled nursing facility.  Chronic atrial fibrillation: Currently rate controlled on Coreg and Eliquis.  Essential hypertension: On losartan and Coreg fairly controlled. Medications will need to be further titrated as an outpatient once pain is controlled.  Diabetes mellitus type 2 without complications: Requiring sliding scale with a recent A1c of 6.7 holding Glucophage continue long-acting insulin plus sliding scale metformin can be resumed as an outpatient. She is currently on steroids which will make her blood glucose erratic.  Hyperlipidemia: Continue statins.  Normocytic anemia: Noted  Obesity: Counseling.  Goals of care: Physical therapy recommended skilled  nursing facility which patient does agree.   Left heel ulcer present on admission: RN Pressure Injury Documentation: Pressure Injury 06/28/21 Heel Left Deep Tissue Pressure Injury - Purple or maroon localized area of discolored intact skin or blood-filled blister due to damage of underlying soft tissue from pressure and/or shear. (Active)  06/28/21 0930  Location: Heel  Location Orientation: Left  Staging: Deep Tissue Pressure Injury - Purple or maroon localized area of discolored intact skin or blood-filled blister due to damage of underlying soft tissue from pressure and/or shear.  Wound Description (Comments):   Present on Admission: Yes     DVT prophylaxis: lovenox Family Communication:son Status is: Inpatient  Remains inpatient appropriate because: Acute back pain leading to ambulatory dysfunction.     Code Status:     Code Status Orders  (From admission, onward)           Start     Ordered   06/23/21 0552  Full code  Continuous        06/23/21 0552           Code Status History     Date Active Date Inactive Code Status Order ID Comments User Context   03/05/2021 0555 03/15/2021 1954 Full Code [id number]  Shela Leff, MD ED      Advance Directive Documentation    Flowsheet Row Most Recent Value  Type of Advance Directive Healthcare Power of Attorney  Pre-existing out of facility DNR order (yellow form or pink MOST form) --  "MOST" Form in Place? --         IV Access:   Peripheral IV   Procedures and diagnostic studies:   No results found.   Medical Consultants:   None.   Subjective:  Kayla [name] her pain is significantly controlled.  Objective:    Vitals:   06/28/21 1500 06/28/21 2008 06/29/21 0417 06/29/21 0750  BP: (!) 157/55 (!) 180/79 (!) 164/71 (!) 161/90  Pulse: 78 81 60 68  Resp: 18 18 16 17   Temp: 97.9 F (36.6 C) 98.8 F (37.1 C) 97.9 F (36.6 C) 98.7 F (37.1 C)  TempSrc: Oral Oral Oral Oral  SpO2:  96% 98% 98% 99%  Weight:   98.9 kg   Height:       SpO2: 99 %   Intake/Output Summary (Last 24 hours) at 06/29/2021 1042 Last data filed at 06/29/2021 8295 Gross per 24 hour  Intake 720 ml  Output 900 ml  Net -180 ml   Filed Weights   06/26/21 0348 06/28/21 0500 06/29/21 0417  Weight: 98.9 kg 100.1 kg 98.9 kg    Exam: General exam: In no acute distress. Respiratory system: Good air movement and clear to auscultation. Cardiovascular system: S1 & S2 heard, RRR. No JVD. Gastrointestinal system: Abdomen is nondistended, soft and nontender.  Extremities: No pedal edema. Skin: No rashes, lesions or ulcers Psychiatry: Judgement and insight appear normal. Mood & affect appropriate.    Data Reviewed:    Labs: Basic Metabolic Panel: Recent Labs  Lab 06/25/21 0140 06/26/21 0123 06/27/21 0254 06/28/21 0059 06/29/21 0347  NA 134* 132* 137 136 134*  K 4.4 4.4 4.8 4.9 4.9  CL 102 101 106 105 104  CO2 26 24 26 26 26   GLUCOSE 141* 165* 143* 143* 123*  BUN 23 22 24* 25* 26*  CREATININE 0.96 0.88 0.76 0.77 0.84  CALCIUM 8.3* 8.1* 8.2* 8.3* 8.3*  MG 1.9 1.8 2.0 2.0 2.1  PHOS 2.7 2.3* 2.6 2.7 3.2   GFR Estimated Creatinine Clearance: 59 mL/min (by C-G formula based on SCr of 0.84 mg/dL). Liver Function Tests: Recent Labs  Lab 06/25/21 0140 06/26/21 0123 06/27/21 0254 06/28/21 0059 06/29/21 0347  AST 14* 29 24 21 18   ALT 14 25 29 30 31   ALKPHOS 73 68 69 73 67  BILITOT 0.5 0.3 0.5 0.4 0.6  PROT 6.3* 6.1* 5.9* 5.9* 5.8*  ALBUMIN 2.2* 2.1* 2.0* 2.1* 2.0*   No results for input(s): LIPASE, AMYLASE in the last 168 hours. No results for input(s): AMMONIA in the last 168 hours. Coagulation profile No results for input(s): INR, PROTIME in the last 168 hours. COVID-19 Labs  No results for input(s): DDIMER, FERRITIN, LDH, CRP in the last 72 hours.  Lab Results  Component Value Date   SARSCOV2NAA NEGATIVE 06/22/2021   [place] NEGATIVE 03/15/2021   [place]  NEGATIVE 03/05/2021    CBC: Recent Labs  Lab 06/25/21 0140 06/26/21 0123 06/27/21 0254 06/28/21 0059 06/29/21 0347  WBC 7.1 10.5 9.3 9.6 11.4*  NEUTROABS 6.2 9.1* 7.9* 7.7 8.9*  HGB 7.9* 7.7* 8.1* 8.4* 8.8*  HCT 24.6* 24.9* 25.7* 27.3* 27.7*  MCV 80.9 81.4 81.3 81.5 81.5  PLT 314 345 346 378 374   Cardiac Enzymes: No results for input(s): CKTOTAL, CKMB, CKMBINDEX, TROPONINI in the last 168 hours. BNP (last 3 results) No results for input(s): PROBNP in the last 8760 hours. CBG: Recent Labs  Lab 06/28/21 1930 06/28/21 2007 06/29/21 0022 06/29/21 0448 06/29/21 0807  GLUCAP 276* 305* 118* 114* 92   D-Dimer: No results for input(s): DDIMER in the last 72 hours. Hgb A1c: No results for input(s): HGBA1C in the last 72 hours. Lipid Profile: No results for input(s): CHOL, HDL, LDLCALC, TRIG, CHOLHDL,  LDLDIRECT in the last 72 hours. Thyroid function studies: No results for input(s): TSH, T4TOTAL, T3FREE, THYROIDAB in the last 72 hours.  Invalid input(s): FREET3 Anemia work up: No results for input(s): VITAMINB12, FOLATE, FERRITIN, TIBC, IRON, RETICCTPCT in the last 72 hours. Sepsis Labs: Recent Labs  Lab 06/26/21 0123 06/27/21 0254 06/28/21 0059 06/29/21 0347  WBC 10.5 9.3 9.6 11.4*   Microbiology Recent Results (from the past 240 hour(s))  Resp Panel by RT-PCR (Flu A&B, Covid)     Status: None   Collection Time: 06/22/21  8:20 PM   Specimen: Nasopharyngeal(NP) swabs in vial transport medium  Result Value Ref Range Status   SARS Coronavirus 2 by RT PCR NEGATIVE NEGATIVE Final    Comment: (NOTE) SARS-CoV-2 target nucleic acids are NOT DETECTED.  The SARS-CoV-2 RNA is generally detectable in upper respiratory specimens during the acute phase of infection. The lowest concentration of SARS-CoV-2 viral copies this assay can detect is 138 copies/mL. A negative result does not preclude SARS-Cov-2 infection and should not be used as the sole basis for treatment  or other patient management decisions. A negative result may occur with  improper specimen collection/handling, submission of specimen other than nasopharyngeal swab, presence of viral mutation(s) within the areas targeted by this assay, and inadequate number of viral copies(<138 copies/mL). A negative result must be combined with clinical observations, patient history, and epidemiological information. The expected result is Negative.  Fact Sheet for Patients:  EntrepreneurPulse.com.au  Fact Sheet for Healthcare Providers:  IncredibleEmployment.be  This test is no t yet approved or cleared by the [place] FDA and  has been authorized for detection and/or diagnosis of SARS-CoV-2 by FDA under an Emergency Use Authorization (EUA). This EUA will remain  in effect (meaning this test can be used) for the duration of the COVID-19 declaration under Section 564(b)(1) of the Act, 21 U.S.C.section 360bbb-3(b)(1), unless the authorization is terminated  or revoked sooner.       Influenza A by PCR NEGATIVE NEGATIVE Final   Influenza B by PCR NEGATIVE NEGATIVE Final    Comment: (NOTE) The Xpert Xpress SARS-CoV-2/FLU/RSV plus assay is intended as an aid in the diagnosis of influenza from Nasopharyngeal swab specimens and should not be used as a sole basis for treatment. Nasal washings and aspirates are unacceptable for Xpert Xpress SARS-CoV-2/FLU/RSV testing.  Fact Sheet for Patients: EntrepreneurPulse.com.au  Fact Sheet for Healthcare Providers: IncredibleEmployment.be  This test is not yet approved or cleared by the [place] FDA and has been authorized for detection and/or diagnosis of SARS-CoV-2 by FDA under an Emergency Use Authorization (EUA). This EUA will remain in effect (meaning this test can be used) for the duration of the COVID-19 declaration under Section 564(b)(1) of the Act, 21 U.S.C. section  360bbb-3(b)(1), unless the authorization is terminated or revoked.  Performed at [facility], [place] [address] [place], [place] [address]      Medications:    apixaban  5 mg Oral BID   atorvastatin  80 mg Oral QHS   carvedilol  6.25 mg Oral BID   COVID-19 mRNA bivalent vaccine (Pfizer)  0.3 mL Intramuscular Once   feeding supplement (GLUCERNA SHAKE)  237 mL Oral BID BM   gabapentin  200 mg Oral TID   insulin aspart  0-20 Units Subcutaneous Q4H   insulin detemir  8 Units Subcutaneous Daily   losartan  50 mg Oral Daily   multivitamin with minerals  1 tablet Oral Daily   predniSONE  40  mg Oral Q breakfast   Continuous Infusions:    LOS: 5 days   Charlynne Cousins  [facility]  06/29/2021, 10:42 AM

## 2021-06-29 NOTE — Plan of Care (Signed)
  Problem: Education: Goal: Knowledge of General Education information will improve Description: Including pain rating scale, medication(s)/side effects and non-pharmacologic comfort measures Outcome: Progressing   Problem: Health Behavior/Discharge Planning: Goal: Ability to manage health-related needs will improve Outcome: Progressing   Problem: Clinical Measurements: Goal: Ability to maintain clinical measurements within normal limits will improve Outcome: Progressing Goal: Will remain free from infection Outcome: Progressing Goal: Diagnostic test results will improve Outcome: Progressing   Problem: Activity: Goal: Risk for activity intolerance will decrease Outcome: Progressing   Problem: Nutrition: Goal: Adequate nutrition will be maintained Outcome: Progressing   Problem: Coping: Goal: Level of anxiety will decrease Outcome: Progressing   Problem: Elimination: Goal: Will not experience complications related to bowel motility Outcome: Progressing Goal: Will not experience complications related to urinary retention Outcome: Progressing   Problem: Pain Managment: Goal: General experience of comfort will improve Outcome: Progressing   Problem: Safety: Goal: Ability to remain free from injury will improve Outcome: Progressing   Problem: Skin Integrity: Goal: Risk for impaired skin integrity will decrease Outcome: Progressing   Problem: Clinical Measurements: Goal: Respiratory complications will improve Outcome: Adequate for Discharge Goal: Cardiovascular complication will be avoided Outcome: Adequate for Discharge

## 2021-06-29 NOTE — Progress Notes (Signed)
Report received and care assumed from previous shift RN. VS obtained, shift assessments completed - see flowsheets. Denies need for PRN pain medications. Heels floated. L heel dressing remains CDI. Needs encouragement for turning and repositioning, refusing at times. Patient currently resting in bed, bed in lowest position. Denies needs. Call bell within reach. Bedalarm in use at all times.

## 2021-06-29 NOTE — Progress Notes (Signed)
Nutrition Follow-up  DOCUMENTATION CODES:  Obesity unspecified  INTERVENTION:  -d/c glucerna shake -PROSource PLUS PO 66mls BID, each supplement provides 100 kcals and 15 grams of protein  NUTRITION DIAGNOSIS:  Inadequate oral intake related to decreased appetite as evidenced by per patient/family report. -- progressing  GOAL:  Patient will meet greater than or equal to 90% of their needs -- progressing  MONITOR:  PO intake, Supplement acceptance  REASON FOR ASSESSMENT:  Malnutrition Screening Tool    ASSESSMENT:  80 yo female admitted with ambulatory dysfunction, L leg pain. PMH includes HLD, HTN, DM-2, spinal stenosis, A fib, lumbar nerve root impingement.  Pt states appetite has improved significantly since admit, but reports that she dislikes Glucerna supplements as they upset her stomach. Pt agreeable to trial of Prosource given its small volume and significant protein content. MD entered room at time of RD visit. Discussed pt with MD outside of pt's room after visit.   PO Intake: 75-100% x last 8 meal completions (~97% avg meal intake)   UOP: 1675ml x24 hours I/O: -38102ml since admit  Current weight: 98.9 kg Admit weight: 99.3 kg   Edema: mild pitting edema to BLE per RN assessment  Medications:  apixaban  5 mg Oral BID   atorvastatin  80 mg Oral QHS   carvedilol  6.25 mg Oral BID   COVID-19 mRNA bivalent vaccine (Pfizer)  0.3 mL Intramuscular Once   feeding supplement (GLUCERNA SHAKE)  237 mL Oral BID BM   gabapentin  200 mg Oral TID   insulin aspart  0-5 Units Subcutaneous QHS   insulin aspart  0-9 Units Subcutaneous TID WC   insulin aspart  3 Units Subcutaneous TID WC   insulin detemir  8 Units Subcutaneous Daily   losartan  50 mg Oral Daily   multivitamin with minerals  1 tablet Oral Daily   predniSONE  40 mg Oral Q breakfast   Labs: Recent Labs  Lab 06/28/21 0059 06/29/21 0347 06/30/21 0121  NA 136 134* 134*  K 4.9 4.9 5.0  CL 105 104 104  CO2  26 26 24   BUN 25* 26* 31*  CREATININE 0.77 0.84 0.80  CALCIUM 8.3* 8.3* 8.3*  MG 2.0 2.1 2.1  PHOS 2.7 3.2 2.8  GLUCOSE 143* 123* 157*  CBGs: 92-241 x24 hours (Diabetes Coordinator following) Note pt is on steroids   NUTRITION - FOCUSED PHYSICAL EXAM: Unable to perform at this time. Will attempt at follow-up.   Diet Order:   Diet Order             Diet Carb Modified Fluid consistency: Thin; Room service appropriate? Yes with Assist  Diet effective now                  EDUCATION NEEDS:  Not appropriate for education at this time  Skin:  Skin Assessment: Skin Integrity Issues: Skin Integrity Issues:: DTI DTI: L heel  Last BM:  11/30  Height:  Ht Readings from Last 1 Encounters:  06/23/21 5\' 4"  (1.626 m)   Weight:  Wt Readings from Last 1 Encounters:  06/29/21 98.9 kg   BMI:  Body mass index is 37.43 kg/m.  Estimated Nutritional Needs:  Kcal:  1700-1900 Protein:  105-120 gm Fluid:  >/= 1.9 L    Rolonda Pontarelli A., MS, RD, LDN (she/her/hers) RD pager number and weekend/on-call pager number located in [place].

## 2021-06-29 NOTE — Progress Notes (Signed)
Inpatient Diabetes Program Recommendations  AACE/ADA: New Consensus Statement on Inpatient Glycemic Control (2015)  Target Ranges:  Prepandial:   less than 140 mg/dL      Peak postprandial:   less than 180 mg/dL (1-2 hours)      Critically ill patients:  140 - 180 mg/dL   Lab Results  Component Value Date   GLUCAP 92 06/29/2021   HGBA1C 6.9 (H) 06/24/2021    Review of Glycemic Control  Latest Reference Range & Units 06/28/21 20:07 06/29/21 00:22 06/29/21 04:48 06/29/21 08:07  Glucose-Capillary 70 - 99 mg/dL 305 (H) 118 (H) 114 (H) 92   Diabetes history: DM 2 Outpatient Diabetes medications:  Metformin 500 mg bid Current orders for Inpatient glycemic control:  Prednisone 40 mg daily Levemir 8 units daily Novolog resistant q 4 hours  Inpatient Diabetes Program Recommendations:    Please change Novolog correction to tid with meals (instead of q 4 hours)  May need the addition of meal coverage while on Prednisone, if blood sugars increase post-prandial.   Thanks,  Adah Perl, RN, BC-ADM Inpatient Diabetes Coordinator Pager [phone]  (8a-5p)

## 2021-06-29 NOTE — Progress Notes (Signed)
   06/29/21 1521  Mobility  Activity Refused mobility (Patient declined despite max encouragement. Stated she's having 10/10 LE pain)

## 2021-06-30 DIAGNOSIS — L97422 Non-pressure chronic ulcer of left heel and midfoot with fat layer exposed: Secondary | ICD-10-CM | POA: Diagnosis not present

## 2021-06-30 DIAGNOSIS — I739 Peripheral vascular disease, unspecified: Secondary | ICD-10-CM | POA: Diagnosis not present

## 2021-06-30 DIAGNOSIS — I4892 Unspecified atrial flutter: Secondary | ICD-10-CM | POA: Diagnosis not present

## 2021-06-30 DIAGNOSIS — I152 Hypertension secondary to endocrine disorders: Secondary | ICD-10-CM | POA: Diagnosis not present

## 2021-06-30 DIAGNOSIS — B9620 Unspecified Escherichia coli [E. coli] as the cause of diseases classified elsewhere: Secondary | ICD-10-CM | POA: Diagnosis not present

## 2021-06-30 DIAGNOSIS — R262 Difficulty in walking, not elsewhere classified: Secondary | ICD-10-CM | POA: Diagnosis not present

## 2021-06-30 DIAGNOSIS — R5381 Other malaise: Secondary | ICD-10-CM | POA: Diagnosis not present

## 2021-06-30 DIAGNOSIS — R0902 Hypoxemia: Secondary | ICD-10-CM | POA: Diagnosis not present

## 2021-06-30 DIAGNOSIS — R52 Pain, unspecified: Secondary | ICD-10-CM | POA: Diagnosis not present

## 2021-06-30 DIAGNOSIS — G4700 Insomnia, unspecified: Secondary | ICD-10-CM | POA: Diagnosis not present

## 2021-06-30 DIAGNOSIS — L89620 Pressure ulcer of left heel, unstageable: Secondary | ICD-10-CM | POA: Diagnosis not present

## 2021-06-30 DIAGNOSIS — E785 Hyperlipidemia, unspecified: Secondary | ICD-10-CM | POA: Diagnosis not present

## 2021-06-30 DIAGNOSIS — Z1331 Encounter for screening for depression: Secondary | ICD-10-CM | POA: Diagnosis not present

## 2021-06-30 DIAGNOSIS — R2689 Other abnormalities of gait and mobility: Secondary | ICD-10-CM | POA: Diagnosis not present

## 2021-06-30 DIAGNOSIS — Z7401 Bed confinement status: Secondary | ICD-10-CM | POA: Diagnosis not present

## 2021-06-30 DIAGNOSIS — L89626 Pressure-induced deep tissue damage of left heel: Secondary | ICD-10-CM | POA: Diagnosis not present

## 2021-06-30 DIAGNOSIS — I890 Lymphedema, not elsewhere classified: Secondary | ICD-10-CM | POA: Diagnosis not present

## 2021-06-30 DIAGNOSIS — Z6837 Body mass index (BMI) 37.0-37.9, adult: Secondary | ICD-10-CM | POA: Diagnosis not present

## 2021-06-30 DIAGNOSIS — I10 Essential (primary) hypertension: Secondary | ICD-10-CM | POA: Diagnosis not present

## 2021-06-30 DIAGNOSIS — E1159 Type 2 diabetes mellitus with other circulatory complications: Secondary | ICD-10-CM | POA: Diagnosis not present

## 2021-06-30 DIAGNOSIS — E1169 Type 2 diabetes mellitus with other specified complication: Secondary | ICD-10-CM | POA: Diagnosis not present

## 2021-06-30 DIAGNOSIS — M5416 Radiculopathy, lumbar region: Secondary | ICD-10-CM | POA: Diagnosis not present

## 2021-06-30 DIAGNOSIS — N390 Urinary tract infection, site not specified: Secondary | ICD-10-CM | POA: Diagnosis not present

## 2021-06-30 DIAGNOSIS — E669 Obesity, unspecified: Secondary | ICD-10-CM | POA: Diagnosis not present

## 2021-06-30 DIAGNOSIS — R41841 Cognitive communication deficit: Secondary | ICD-10-CM | POA: Diagnosis not present

## 2021-06-30 DIAGNOSIS — E11618 Type 2 diabetes mellitus with other diabetic arthropathy: Secondary | ICD-10-CM | POA: Diagnosis not present

## 2021-06-30 DIAGNOSIS — M6281 Muscle weakness (generalized): Secondary | ICD-10-CM | POA: Diagnosis not present

## 2021-06-30 DIAGNOSIS — Z86711 Personal history of pulmonary embolism: Secondary | ICD-10-CM | POA: Diagnosis not present

## 2021-06-30 DIAGNOSIS — I4891 Unspecified atrial fibrillation: Secondary | ICD-10-CM | POA: Diagnosis not present

## 2021-06-30 DIAGNOSIS — M48061 Spinal stenosis, lumbar region without neurogenic claudication: Secondary | ICD-10-CM | POA: Diagnosis not present

## 2021-06-30 DIAGNOSIS — G8929 Other chronic pain: Secondary | ICD-10-CM | POA: Diagnosis not present

## 2021-06-30 LAB — CBC WITH DIFFERENTIAL/PLATELET
Abs Immature Granulocytes: 0.15 10*3/uL — ABNORMAL HIGH (ref 0.00–0.07)
Basophils Absolute: 0 10*3/uL (ref 0.0–0.1)
Basophils Relative: 0 %
Eosinophils Absolute: 0 10*3/uL (ref 0.0–0.5)
Eosinophils Relative: 0 %
HCT: 28.2 % — ABNORMAL LOW (ref 36.0–46.0)
Hemoglobin: 8.9 g/dL — ABNORMAL LOW (ref 12.0–15.0)
Immature Granulocytes: 1 %
Lymphocytes Relative: 14 %
Lymphs Abs: 1.7 10*3/uL (ref 0.7–4.0)
MCH: 25.9 pg — ABNORMAL LOW (ref 26.0–34.0)
MCHC: 31.6 g/dL (ref 30.0–36.0)
MCV: 82 fL (ref 80.0–100.0)
Monocytes Absolute: 1 10*3/uL (ref 0.1–1.0)
Monocytes Relative: 9 %
Neutro Abs: 9 10*3/uL — ABNORMAL HIGH (ref 1.7–7.7)
Neutrophils Relative %: 76 %
Platelets: 412 10*3/uL — ABNORMAL HIGH (ref 150–400)
RBC: 3.44 MIL/uL — ABNORMAL LOW (ref 3.87–5.11)
RDW: 14.6 % (ref 11.5–15.5)
WBC: 11.9 10*3/uL — ABNORMAL HIGH (ref 4.0–10.5)
nRBC: 0 % (ref 0.0–0.2)

## 2021-06-30 LAB — COMPREHENSIVE METABOLIC PANEL
ALT: 34 U/L (ref 0–44)
AST: 17 U/L (ref 15–41)
Albumin: 2.1 g/dL — ABNORMAL LOW (ref 3.5–5.0)
Alkaline Phosphatase: 73 U/L (ref 38–126)
Anion gap: 6 (ref 5–15)
BUN: 31 mg/dL — ABNORMAL HIGH (ref 8–23)
CO2: 24 mmol/L (ref 22–32)
Calcium: 8.3 mg/dL — ABNORMAL LOW (ref 8.9–10.3)
Chloride: 104 mmol/L (ref 98–111)
Creatinine, Ser: 0.8 mg/dL (ref 0.44–1.00)
GFR, Estimated: >60 mL/min (ref >60)
Glucose, Bld: 157 mg/dL — ABNORMAL HIGH (ref 70–99)
Potassium: 5 mmol/L (ref 3.5–5.1)
Sodium: 134 mmol/L — ABNORMAL LOW (ref 135–145)
Total Bilirubin: 0.3 mg/dL (ref 0.3–1.2)
Total Protein: 5.8 g/dL — ABNORMAL LOW (ref 6.5–8.1)

## 2021-06-30 LAB — GLUCOSE, CAPILLARY
Glucose-Capillary: 150 mg/dL — ABNORMAL HIGH (ref 70–99)
Glucose-Capillary: 140 mg/dL — ABNORMAL HIGH (ref 70–99)
Glucose-Capillary: 220 mg/dL — ABNORMAL HIGH (ref 70–99)

## 2021-06-30 LAB — PHOSPHORUS: Phosphorus: 2.8 mg/dL (ref 2.5–4.6)

## 2021-06-30 LAB — RESP PANEL BY RT-PCR (FLU A&B, COVID) ARPGX2
Influenza A by PCR: NEGATIVE
Influenza B by PCR: NEGATIVE
SARS Coronavirus 2 by RT PCR: NEGATIVE

## 2021-06-30 LAB — MAGNESIUM: Magnesium: 2.1 mg/dL (ref 1.7–2.4)

## 2021-06-30 MED ORDER — GABAPENTIN 100 MG PO CAPS: 200.0000 mg | ORAL_CAPSULE | Freq: Three times a day (TID) | ORAL | Status: DC

## 2021-06-30 MED ORDER — SODIUM ZIRCONIUM CYCLOSILICATE 10 G PO PACK
10.0000 g | PACK | Freq: Two times a day (BID) | ORAL | Status: DC
  Administered 2021-06-30: 10 g via ORAL
  Filled 2021-06-30: qty 1

## 2021-06-30 MED ORDER — INSULIN DETEMIR 100 UNIT/ML ~~LOC~~ SOLN: 8.0000 [IU] | Freq: Every day | SUBCUTANEOUS | 11 refills | Status: DC

## 2021-06-30 MED ORDER — PREDNISONE 10 MG PO TABS: ORAL_TABLET | ORAL | 0 refills | Status: DC

## 2021-06-30 MED ORDER — PROSOURCE PLUS PO LIQD
30.0000 mL | Freq: Two times a day (BID) | ORAL | Status: DC
Start: 2021-06-30 — End: 2021-06-30
  Administered 2021-06-30: 30 mL via ORAL
  Filled 2021-06-30: qty 30

## 2021-06-30 NOTE — Progress Notes (Addendum)
Occupational Therapy Treatment Patient Details Name: Kayla [name] MRN: [id number] DOB: Aug 29, 1940 Today's Date: 06/30/2021   History of present illness Pt is 80 y.o. F presenting to ED on 06/22/21 for progressive LLE weakness.  Found to have severe spinal stenosis.  Currently being managed medically (not surgically) per pt's preference.  PMH includes arthiritis, A fib, DM, and HTN   OT comments  Pt requiring increased assistance to lateral scoot this session, max A +2 to move from chair > bed in preparation for transport. Pt reporting dizziness when attempting lateral scoot, resolved once supine in bed. Pt requires increased verbal cuing during transfers this session for safety and hand placement. Pt mod A +2 for rolling R/L for pad change. Pt continues to be limited by LLE pain, decreased activity tolerance, and generalized weakness at this time. Will continue to follow acutely. Recommend d/c to SNF.   Recommendations for follow up therapy are one component of a multi-disciplinary discharge planning process, led by the attending physician.  Recommendations may be updated based on patient status, additional functional criteria and insurance authorization.    Follow Up Recommendations  Skilled nursing-short term rehab (<3 hours/day)    Assistance Recommended at Discharge Intermittent Supervision/Assistance  Equipment Recommendations  Wheelchair (measurements OT);BSC/3in1;Wheelchair cushion (measurements OT)    Recommendations for Other Services PT consult    Precautions / Restrictions Precautions Precautions: Fall Precaution Comments: left leg is weak and painful Restrictions Weight Bearing Restrictions: No       Mobility Bed Mobility Overal bed mobility: Needs Assistance Bed Mobility: Sit to Supine Rolling: Mod assist;+2 for physical assistance   Supine to sit: Max assist;HOB elevated     General bed mobility comments: Mod assist for safe transition to EOB.  HOB 30 degrees,  pt using bed rail and needed significant extra time to complete.   Also, less painful in the legs if she moves them herself.  Pt with posterior lean and needed incr time and assist with pad to scoot to EOB.    Transfers Overall transfer level: Needs assistance   Transfers: Bed to chair/wheelchair/BSC            Lateral/Scoot Transfers: Max assist;+2 physical assistance     Balance Overall balance assessment: Needs assistance Sitting-balance support: Feet supported;No upper extremity supported Sitting balance-Leahy Scale: Fair Sitting balance - Comments: able to complete LE exercise when sitting up in chair Postural control: Posterior lean                                 ADL either performed or assessed with clinical judgement   ADL Overall ADL's : Needs assistance/impaired                         Toilet Transfer: Maximal assistance;+2 for physical assistance;BSC/3in1;Requires drop arm Toilet Transfer Details (indicate cue type and reason): simulated to drop arm recliner, lateral scoot         Functional mobility during ADLs: Maximal assistance;+2 for physical assistance      Extremity/Trunk Assessment Upper Extremity Assessment Upper Extremity Assessment: Generalized weakness   Lower Extremity Assessment Lower Extremity Assessment: Defer to PT evaluation        Vision   Vision Assessment?: No apparent visual deficits   Perception Perception Perception: Not tested   Praxis Praxis Praxis: Not tested    Cognition Arousal/Alertness: Awake/alert Behavior During Therapy: [facility] for tasks assessed/performed Overall Cognitive  Status: Within Functional Limits for tasks assessed                                 General Comments: Pt is fearful to attempt standing due to pain and weakness in her left leg.          Exercises    Shoulder Instructions       General Comments      Pertinent Vitals/ Pain       Pain  Assessment: Faces Pain Score: 3  Faces Pain Scale: Hurts a little bit Pain Location: left leg ankle Pain Descriptors / Indicators: Aching;Grimacing;Guarding Pain Intervention(s): Limited activity within patient's tolerance;Monitored during session;Repositioned  Home Living                                          Prior Functioning/Environment              Frequency  Min 2X/week        Progress Toward Goals  OT Goals(current goals can now be found in the care plan section)  Progress towards OT goals: Progressing toward goals  Acute Rehab OT Goals Patient Stated Goal: none stated OT Goal Formulation: With patient Time For Goal Achievement: 07/01/21 Potential to Achieve Goals: [place] Discharge plan remains appropriate;Frequency remains appropriate    Co-evaluation                 AM-PAC OT "6 Clicks" Daily Activity     Outcome Measure   Help from another person eating meals?: None Help from another person taking care of personal grooming?: A Little Help from another person toileting, which includes using toliet, bedpan, or urinal?: Total Help from another person bathing (including washing, rinsing, drying)?: Total Help from another person to put on and taking off regular upper body clothing?: A Lot Help from another person to put on and taking off regular lower body clothing?: Total 6 Click Score: 12    End of Session    OT Visit Diagnosis: Unsteadiness on feet (R26.81);Other abnormalities of gait and mobility (R26.89);Muscle weakness (generalized) (M62.81);Pain   Activity Tolerance Patient limited by fatigue   Patient Left in bed;with call bell/phone within reach;with bed alarm set   Nurse Communication Mobility status        Time: 5093-2671 OT Time Calculation (min): 26 min  Charges: OT General Charges $OT Visit: 1 Visit OT Treatments $Self Care/Home Management : 23-37 mins  Lynnda Child, OTD, OTR/L Acute  Rehab [phone]) [phone] - [place] 06/30/2021, 2:21 PM

## 2021-06-30 NOTE — Progress Notes (Signed)
Physical Therapy Treatment Patient Details Name: Kayla [name] MRN: [id number] DOB: [birth year] Today's Date: 06/30/2021   History of Present Illness Pt is 80 y.o. F presenting to ED on 06/22/21 for progressive LLE weakness.  Found to have severe spinal stenosis.  Currently being managed medically (not surgically) per pt's preference.  PMH includes arthiritis, A fib, DM, and HTN    PT Comments    Pt admitted with above diagnosis. Pt was able to sit EOB with min guard assist and do some exercises. Maximove used to get pt to recliner and pt continued to perform LE exercises once in chair. Felt it safer to use lift today to get pt into chair. Pt appreciative and happy to be up.  Pt currently with functional limitations due to balance and endurance deficits. Pt will benefit from skilled PT to increase their independence and safety with mobility to allow discharge to the venue listed below.      Recommendations for follow up therapy are one component of a multi-disciplinary discharge planning process, led by the attending physician.  Recommendations may be updated based on patient status, additional functional criteria and insurance authorization.  Follow Up Recommendations  Skilled nursing-short term rehab (<3 hours/day)     Assistance Recommended at Discharge Frequent or constant Supervision/Assistance  Equipment Recommendations  Wheelchair (measurements PT);Wheelchair cushion (measurements PT);Hospital bed;Other (comment) (hoyer lift)    Recommendations for Other Services       Precautions / Restrictions Precautions Precautions: Fall Precaution Comments: left leg is weak and painful Restrictions Weight Bearing Restrictions: No     Mobility  Bed Mobility Overal bed mobility: Needs Assistance Bed Mobility: Supine to Sit;Sit to Supine     Supine to sit: HOB elevated;Mod assist     General bed mobility comments: Mod assist for safe transition to EOB.  HOB 30 degrees, pt using bed  rail and needed significant extra time to complete.   Also, less painful in the legs if she moves them herself.  Pt with posterior lean and needed incr time and assist with pad to scoot to EOB.    Transfers Overall transfer level: Needs assistance   Transfers: Bed to chair/wheelchair/BSC             General transfer comment: Used Maximove to move pt sitting EOB to chair as she reports she doesnt feel like she has the stamina to stand. Transfer via Lift Equipment: Maximove  Ambulation/Gait                   Stairs             Wheelchair Mobility    Modified Rankin (Stroke Patients Only)       Balance Overall balance assessment: Needs assistance Sitting-balance support: Feet supported;No upper extremity supported Sitting balance-Leahy Scale: Fair Sitting balance - Comments: slight mild posterior lean in sitting, trunk fatigues quickly even with UE support.  Pt was able to complete some exercises sitting EOB. [name] Postural control: Posterior lean                                  Cognition Arousal/Alertness: Awake/alert Behavior During Therapy: WFL for tasks assessed/performed Overall Cognitive Status: Within Functional Limits for tasks assessed                                 General Comments: Pt  is fearful to attempt standing due to pain and weakness in her left leg.        Exercises General Exercises - Lower Extremity Ankle Circles/Pumps: AROM;Both;10 reps;Seated Quad Sets: AROM;Both;10 reps;Supine Long Arc Quad: AROM;Both;10 reps;Seated (significantly more difficult on the L combo of pain and weakness.) Hip ABduction/ADduction: AROM;Both;10 reps;Seated Hip Flexion/Marching: AROM;Both;10 reps;Seated    General Comments        Pertinent Vitals/Pain Pain Assessment: Faces Faces Pain Scale: Hurts even more Pain Location: left leg ankle Pain Descriptors / Indicators: Aching;Grimacing;Guarding Pain Intervention(s):  Limited activity within patient's tolerance;Monitored during session;Repositioned;Patient requesting pain meds-RN notified    Home Living                          Prior Function            PT Goals (current goals can now be found in the care plan section) Acute Rehab PT Goals Patient Stated Goal: to get better Progress towards PT goals: Progressing toward goals    Frequency    Min 2X/week      PT Plan Current plan remains appropriate    Co-evaluation              AM-PAC PT "6 Clicks" Mobility   Outcome Measure  Help needed turning from your back to your side while in a flat bed without using bedrails?: A Lot Help needed moving from lying on your back to sitting on the side of a flat bed without using bedrails?: A Lot Help needed moving to and from a bed to a chair (including a wheelchair)?: Total Help needed standing up from a chair using your arms (e.g., wheelchair or bedside chair)?: Total Help needed to walk in hospital room?: Total Help needed climbing 3-5 steps with a railing? : Total 6 Click Score: 8    End of Session Equipment Utilized During Treatment: Gait belt Activity Tolerance: Patient limited by pain;Patient limited by fatigue Patient left: with call bell/phone within reach;in chair;with chair alarm set Nurse Communication: Mobility status;Need for lift equipment (Stedy vs Maximove for back to bed) PT Visit Diagnosis: Muscle weakness (generalized) (M62.81);Pain Pain - Right/Left: Left Pain - part of body: Leg     Time: 8119-1478 PT Time Calculation (min) (ACUTE ONLY): 14 min  Charges:  $Therapeutic Activity: 8-22 mins                     Lakita Sahlin M,PT Acute Rehab Services [phone] (pager)    Alvira Philips 06/30/2021, 11:55 AM

## 2021-06-30 NOTE — TOC Progression Note (Signed)
Transition of Care [facility]) - Progression Note    Patient Details  Name: Kayla [name] MRN: [id number] Date of Birth: [birth year]  Transition [facility] Care [facility]) CM/SW Contact  Joanne Chars, LCSW Phone Number: 06/30/2021, 8:58 AM  Clinical Narrative:   CSW spoke with navi and they do manage this [employer] policy for SNF auth.     Expected Discharge Plan: [place] Barriers to Discharge: Continued Medical Work up  Expected Discharge Plan and Services Expected Discharge Plan: [place]   Discharge Planning Services: CM Consult   Living arrangements for the past 2 months: [place]:  [facility] active for home health, [facility] provided by family on Monday and Thursday)         Social Determinants of Health (SDOH) Interventions    Readmission Risk Interventions No flowsheet data found.

## 2021-06-30 NOTE — TOC Transition Note (Signed)
Transition of Care [facility]) - CM/SW Discharge Note   Patient Details  Name: Kayla [name] MRN: [id number] Date of Birth: 1940/09/08  Transition of Care [facility]) CM/SW Contact:  Joanne Chars, LCSW Phone Number: 06/30/2021, 1:11 PM   Clinical Narrative:   Pt discharging to [facility].  RN call report to [phone].  [facility] will pick pt up between 4-430pm.     Final next level of care: [place] Barriers to Discharge: Barriers Resolved   Patient Goals and CMS Choice Patient states their goals for this hospitalization and ongoing recovery are:: patient wants to be able to walk CMS Medicare.gov Compare Post Acute Care list provided to:: Patient Choice offered to / list presented to : Patient  Discharge Placement              Patient chooses bed at:  [facility]) Patient to be transferred to facility by: [facility] Name of family member notified: son [name] Patient and family notified of of transfer: 06/30/21  Discharge Plan and Services   Discharge Planning Services: CM Consult                        [facility] Agency:  [facility] active for home health, [facility] provided by family on Monday and Thursday)        Social Determinants of Health (SDOH) Interventions     Readmission Risk Interventions No flowsheet data found.

## 2021-06-30 NOTE — Discharge Summary (Signed)
Physician Discharge Summary  Kayla [name] IRC:[id number] DOB: [birth year] DOA: 06/22/2021  PCP: Denita Lung, MD  Admit date: 06/22/2021 Discharge date: 06/30/2021  Admitted From: Home Disposition:  SNF  Recommendations for Outpatient Follow-up:  Follow up with PCP in 1-2 weeks Please obtain BMP/CBC in one week Palliative Care to meet with family at facility to discuss goals of care   Home Health:No Equipment/Devices:None  Discharge Condition:Stable CODE STATUS:full Diet recommendation: Heart Healthy   Brief/Interim Summary: 80 y.o. female past medical history of diabetes, hypertension, atrial fibrillation on Eliquis, PE presented with ambulatory dysfunction she relates she has been able to walk progressively getting worse since last hospitalization went to SNF and was doing okay after being at home she started having problems with her left leg she denies any back pain.  Discharge Diagnoses:  Principal Problem:   Ambulatory dysfunction Active Problems:   Class 2 obesity due to excess calories with body mass index (BMI) of 37.0 to 37.9 in adult   Hyperlipidemia associated with type 2 diabetes mellitus ([place])   Primary hypertension   Type 2 diabetes mellitus (H[facility])   Generalized weakness   Spinal stenosis   Unspecified atrial fibrillation ([place])   Spinal stenosis of lumbar region at multiple levels   Lumbar nerve root impingement  Spinal stenosis/nerve root impingement at L-spine level: Dr. Trenton Gammon was consulted recommended steroids. Surgeon discussed with the patient multiple times that surgery might be beneficial however the patient refused several times after discussing with the patient. She was kept on steroids her pain improved she was able to ambulate. Neurosurgery recommended to follow-up with [facility] neurosurgery clinic as an outpatient continue Neurontin and prednisone she will go home on a prednisone taper.  Generalized weakness/ambulatory dysfunction: Possibly  due to spinal stenosis, physical therapy evaluated the patient recommended skilled nursing facility which patient agrees.  Chronic atrial fibrillation: Rate controlled on Coreg and Eliquis no changes made to her medication.  Essential hypertension: Continue current medication no changes made.  Diabetes mellitus type 2 without complications: With an A1c of 6.7, Glucophage was held on admission she was started on a sliding scale insulin Glucophage will be resumed as an outpatient. She will go home on long-acting insulin while she is on steroids.  Hyperlipidemia: Continue statins.  Normocytic anemia: Noted.  Obesity: Counseling.  Discharge Instructions  Discharge Instructions     Diet - low sodium heart healthy   Complete by: As directed    Increase activity slowly   Complete by: As directed    No wound care   Complete by: As directed       Allergies as of 06/30/2021   No Known Allergies      Medication List     TAKE these medications    Accu-Chek Softclix Lancets lancets 1 each by Other route daily. Use as instructed   acetaminophen 650 MG CR tablet Commonly known as: TYLENOL Take 650 mg by mouth every 8 (eight) hours as needed for pain.   ammonium lactate 12 % lotion Commonly known as: LAC-HYDRIN Apply 1 application topically as needed for dry skin.   apixaban 5 MG Tabs tablet Commonly known as: ELIQUIS Take 1 tablet (5 mg total) by mouth 2 (two) times daily.   atorvastatin 80 MG tablet Commonly known as: LIPITOR Take 1 tablet (80 mg total) by mouth daily. What changed: when to take this   carbamide peroxide 6.5 % OTIC solution Commonly known as: DEBROX Place 1-2 drops into both ears daily as needed (  wax build up).   carvedilol 6.25 MG tablet Commonly known as: COREG Take 1 tablet (6.25 mg total) by mouth 2 (two) times daily.   diclofenac Sodium 1 % Gel Commonly known as: VOLTAREN Apply 2 g topically 4 (four) times daily. Rub into affected area  of foot 2 to 4 times daily What changed:  when to take this reasons to take this additional instructions   ezetimibe 10 MG tablet Commonly known as: ZETIA Take 1 tablet (10 mg total) by mouth daily. What changed: when to take this   furosemide 20 MG tablet Commonly known as: LASIX Take 1 tablet (20 mg total) by mouth daily as needed (take as needed for swelling). What changed: reasons to take this   gabapentin 100 MG capsule Commonly known as: NEURONTIN Take 2 capsules (200 mg total) by mouth 3 (three) times daily.   glucose blood test strip 1 each by Other route as needed for other. Use as instructed What changed: when to take this   insulin detemir 100 UNIT/ML injection Commonly known as: LEVEMIR Inject 0.08 mLs (8 Units total) into the skin daily for 10 days. Continue while on steroids. Start taking on: July 01, 2021   losartan 50 MG tablet Commonly known as: COZAAR Take 1 tablet (50 mg total) by mouth daily. What changed: when to take this   metFORMIN 500 MG tablet Commonly known as: GLUCOPHAGE Take 1 tablet (500 mg total) by mouth 2 (two) times daily with a meal.   nitroGLYCERIN 0.4 MG SL tablet Commonly known as: NITROSTAT Take up to 3 tablets What changed:  how much to take when to take this reasons to take this additional instructions   predniSONE 10 MG tablet Commonly known as: DELTASONE Takes 6 tablets for 1 days, then 5 tablets for 1 days, then 4 tablets for 1 days, then 3 tablets for 1 days, then 2 tabs for 1 days, then 1 tab for 1 days, and then stop.   TRIAMCINOLONE ACETONIDE (TOP) 0.05 % Oint Apply 1 application topically 2 (two) times daily as needed (rough skin on heels).        Follow-up Information     Denita Lung, MD .   Specialty: Family Medicine Why: As needed Contact information: [place] [address] [phone]         [place] .   Specialty:  Emergency Medicine Why: If symptoms worsen Contact information: [address] [id number] [place], [facility] [place] Follow up.   Specialty: Home Health Services Why: Patient is active with [facility] health services. Contact information: [place] [address] [phone]                No Known Allergies  Consultations: Neurosurgery   Procedures/Studies: CT Lumbar Spine Wo Contrast  Result Date: 06/22/2021 CLINICAL DATA:  Spinal stenosis, lumbosacral EXAM: CT LUMBAR SPINE WITHOUT CONTRAST TECHNIQUE: Multidetector CT imaging of the lumbar spine was performed without intravenous contrast administration. Multiplanar CT image reconstructions were also generated. COMPARISON:  No prior CT of the lumbar spine. Correlation is made with 11/04/2014 lumbar spine radiographs. FINDINGS: Segmentation: 5 lumbar type vertebrae. Right greater than left lumbosacral assimilation joints. Alignment: Trace anterolisthesis L3 on L4. Grade 1 anterolisthesis L4 on L5. Vertebrae: No acute fracture or suspicious osseous lesion. Vertebral body heights are preserved. Paraspinal and other soft tissues:  Aortic atherosclerosis. Otherwise negative. Disc levels: T12-L1: Minimal disc bulge. No spinal canal stenosis or neural foraminal narrowing. L1-L2: Mild disc bulge. Mild facet arthropathy. No spinal canal stenosis. Mild bilateral neural foraminal narrowing. L2-L3: Disc height loss with vacuum disc and disc osteophyte complex with moderate disc bulge. Moderate facet arthropathy. Moderate spinal canal stenosis and mild bilateral neural foraminal narrowing. L3-L4: Mild calcified disc bulge. Moderate facet arthropathy. Ligamentum flavum hypertrophy. Moderate to severe spinal canal stenosis. Moderate bilateral neural foraminal narrowing. L4-L5: Grade 1 anterolisthesis with disc unroofing. Severe facet arthropathy.  Ligamentum flavum hypertrophy. Moderate to severe spinal canal stenosis. Moderate left greater than right neural foraminal narrowing. L5-S1: Broad-based disc bulge. Mild facet arthropathy. No spinal canal stenosis or neural foraminal narrowing. IMPRESSION: 1. L3-L4 and L4-L5 moderate to severe spinal canal stenosis and moderate bilateral neural foraminal narrowing. 2. L2-L3 moderate spinal canal stenosis and mild bilateral neural foraminal narrowing. Electronically Signed   By: Merilyn Baba M.D.   On: 06/22/2021 20:36   MR LUMBAR SPINE WO CONTRAST  Result Date: 06/22/2021 CLINICAL DATA:  Apical see ambulating, urinary and fecal incontinence, left leg pain EXAM: MRI LUMBAR SPINE WITHOUT CONTRAST TECHNIQUE: Multiplanar, multisequence MR imaging of the lumbar spine was performed. No intravenous contrast was administered. COMPARISON:  No prior MRI of the lumbar spine, correlation is made with CT lumbar spine 06/22/2021. FINDINGS: Segmentation: 5 lumbar type vertebral bodies, with lumbosacral assimilation joints. Alignment: Trace retrolisthesis L1 on L2. Trace anterolisthesis L3 on L4. Grade 1 anterolisthesis L4 on L5. Vertebrae:  No acute fracture or suspicious osseous lesion. Conus medullaris and cauda equina: Conus extends to the L2 level. Conus and cauda equina appear normal. Paraspinal and other soft tissues: Renal cysts. Disc levels: T12-L1: Minimal disc bulge. Mild facet arthropathy. No spinal canal stenosis or neural foraminal narrowing. L1-L2: Trace retrolisthesis with mild disc bulge. Moderate facet arthropathy and ligamentum flavum hypertrophy. Minimal spinal canal stenosis. Mild narrowing of the lateral recesses. No neural foraminal narrowing. L2-L3: Disc height loss with broad-based disc bulge. Severe facet arthropathy and ligamentum flavum hypertrophy. Moderate spinal canal stenosis. Effacement of the lateral recesses. No neural foraminal narrowing. L3-L4: Broad-based disc bulge. Severe facet  arthropathy. Ligamentum flavum hypertrophy. Moderate to severe spinal canal stenosis with effacement of the lateral recesses. Mild-to-moderate left neural foraminal narrowing. L4-L5: Grade 1 anterolisthesis with disc unroofing and broad-based disc bulge. Severe facet arthropathy. Mild spinal canal stenosis. Narrowing of the lateral recesses. Mild left neural foraminal narrowing. L5-S1: No significant disc bulge. Severe facet arthropathy. No spinal canal stenosis or neural foraminal narrowing. IMPRESSION: 1. L3-L4 moderate to severe spinal canal stenosis and mild-to-moderate left neural foraminal narrowing. Effacement of the lateral recesses at this level likely compresses the descending L4 nerves. 2. L2-L3 moderate spinal canal stenosis. Effacement of the lateral recesses at this level likely compresses the descending L3 nerves. 3. L4-L5 mild spinal canal stenosis and mild left neural foraminal narrowing. Narrowing of the lateral recesses at this level could affect the descending L5 nerves. 4. Narrowing of the lateral recesses at L1-L2 could affect the descending L2 nerves. Electronically Signed   By: Merilyn Baba M.D.   On: 06/22/2021 23:59     Subjective: Relates her pain  Discharge Exam: Vitals:   06/30/21 0439 06/30/21 0751  BP: (!) 150/70 (!) 134/54  Pulse: (!) 59 81  Resp: 17 16  Temp: 97.7 F (36.5 C) 98.1 F (36.7 C)  SpO2: 100% 100%   Vitals:   06/29/21 1500 06/29/21 2009 06/30/21 0439  06/30/21 0751  BP: (!) 163/62 (!) 143/77 (!) 150/70 (!) 134/54  Pulse: 70 80 (!) 59 81  Resp: 19 17 17 16   Temp: 98.5 F (36.9 C) 97.8 F (36.6 C) 97.7 F (36.5 C) 98.1 F (36.7 C)  TempSrc: Oral Oral Oral Oral  SpO2: 98% 97% 100% 100%  Weight:      Height:        General: Pt is alert, awake, not in acute distress Cardiovascular: RRR, S1/S2 +, no rubs, no gallops Respiratory: CTA bilaterally, no wheezing, no rhonchi Abdominal: Soft, NT, ND, bowel sounds + Extremities: no edema, no  cyanosis    The results of significant diagnostics from this hospitalization (including imaging, microbiology, ancillary and laboratory) are listed below for reference.     Microbiology: Recent Results (from the past 240 hour(s))  Resp Panel by RT-PCR (Flu A&B, Covid)     Status: None   Collection Time: 06/22/21  8:20 PM   Specimen: Nasopharyngeal(NP) swabs in vial transport medium  Result Value Ref Range Status   SARS Coronavirus 2 by RT PCR NEGATIVE NEGATIVE Final    Comment: (NOTE) SARS-CoV-2 target nucleic acids are NOT DETECTED.  The SARS-CoV-2 RNA is generally detectable in upper respiratory specimens during the acute phase of infection. The lowest concentration of SARS-CoV-2 viral copies this assay can detect is 138 copies/mL. A negative result does not preclude SARS-Cov-2 infection and should not be used as the sole basis for treatment or other patient management decisions. A negative result may occur with  improper specimen collection/handling, submission of specimen other than nasopharyngeal swab, presence of viral mutation(s) within the areas targeted by this assay, and inadequate number of viral copies(<138 copies/mL). A negative result must be combined with clinical observations, patient history, and epidemiological information. The expected result is Negative.  Fact Sheet for Patients:  EntrepreneurPulse.com.au  Fact Sheet for Healthcare Providers:  IncredibleEmployment.be  This test is no t yet approved or cleared by the [place] FDA and  has been authorized for detection and/or diagnosis of SARS-CoV-2 by FDA under an Emergency Use Authorization (EUA). This EUA will remain  in effect (meaning this test can be used) for the duration of the COVID-19 declaration under Section 564(b)(1) of the Act, 21 U.S.C.section 360bbb-3(b)(1), unless the authorization is terminated  or revoked sooner.       Influenza A by PCR  NEGATIVE NEGATIVE Final   Influenza B by PCR NEGATIVE NEGATIVE Final    Comment: (NOTE) The Xpert Xpress SARS-CoV-2/FLU/RSV plus assay is intended as an aid in the diagnosis of influenza from Nasopharyngeal swab specimens and should not be used as a sole basis for treatment. Nasal washings and aspirates are unacceptable for Xpert Xpress SARS-CoV-2/FLU/RSV testing.  Fact Sheet for Patients: EntrepreneurPulse.com.au  Fact Sheet for Healthcare Providers: IncredibleEmployment.be  This test is not yet approved or cleared by the [place] FDA and has been authorized for detection and/or diagnosis of SARS-CoV-2 by FDA under an Emergency Use Authorization (EUA). This EUA will remain in effect (meaning this test can be used) for the duration of the COVID-19 declaration under Section 564(b)(1) of the Act, 21 U.S.C. section 360bbb-3(b)(1), unless the authorization is terminated or revoked.  Performed at [facility], [place] [address] [place], [place] [address]      Labs: BNP (last 3 results) Recent Labs    03/04/21 2353  BNP 55.7   Basic Metabolic Panel: Recent Labs  Lab 06/26/21 0123 06/27/21 0254 06/28/21 0059 06/29/21 0347 06/30/21  0121  NA 132* 137 136 134* 134*  K 4.4 4.8 4.9 4.9 5.0  CL 101 106 105 104 104  CO2 24 26 26 26 24   GLUCOSE 165* 143* 143* 123* 157*  BUN 22 24* 25* 26* 31*  CREATININE 0.88 0.76 0.77 0.84 0.80  CALCIUM 8.1* 8.2* 8.3* 8.3* 8.3*  MG 1.8 2.0 2.0 2.1 2.1  PHOS 2.3* 2.6 2.7 3.2 2.8   Liver Function Tests: Recent Labs  Lab 06/26/21 0123 06/27/21 0254 06/28/21 0059 06/29/21 0347 06/30/21 0121  AST 29 24 21 18 17   ALT 25 29 30 31  34  ALKPHOS 68 69 73 67 73  BILITOT 0.3 0.5 0.4 0.6 0.3  PROT 6.1* 5.9* 5.9* 5.8* 5.8*  ALBUMIN 2.1* 2.0* 2.1* 2.0* 2.1*   No results for input(s): LIPASE, AMYLASE in the last 168 hours. No results for input(s): AMMONIA in the last 168 hours. CBC: Recent Labs   Lab 06/26/21 0123 06/27/21 0254 06/28/21 0059 06/29/21 0347 06/30/21 0121  WBC 10.5 9.3 9.6 11.4* 11.9*  NEUTROABS 9.1* 7.9* 7.7 8.9* 9.0*  HGB 7.7* 8.1* 8.4* 8.8* 8.9*  HCT 24.9* 25.7* 27.3* 27.7* 28.2*  MCV 81.4 81.3 81.5 81.5 82.0  PLT 345 346 378 374 412*   Cardiac Enzymes: No results for input(s): CKTOTAL, CKMB, CKMBINDEX, TROPONINI in the last 168 hours. BNP: Invalid input(s): POCBNP CBG: Recent Labs  Lab 06/29/21 0807 06/29/21 1236 06/29/21 1659 06/29/21 2011 06/30/21 0757  GLUCAP 92 120* 204* 241* 150*   D-Dimer No results for input(s): DDIMER in the last 72 hours. Hgb A1c No results for input(s): HGBA1C in the last 72 hours. Lipid Profile No results for input(s): CHOL, HDL, LDLCALC, TRIG, CHOLHDL, LDLDIRECT in the last 72 hours. Thyroid function studies No results for input(s): TSH, T4TOTAL, T3FREE, THYROIDAB in the last 72 hours.  Invalid input(s): FREET3 Anemia work up No results for input(s): VITAMINB12, FOLATE, FERRITIN, TIBC, IRON, RETICCTPCT in the last 72 hours. Urinalysis    Component Value Date/Time   COLORURINE YELLOW 06/22/2021 2113   APPEARANCEUR CLEAR 06/22/2021 2113   LABSPEC 1.014 06/22/2021 2113   PHURINE 5.0 06/22/2021 2113   GLUCOSEU NEGATIVE 06/22/2021 2113   HGBUR NEGATIVE 06/22/2021 2113   BILIRUBINUR NEGATIVE 06/22/2021 2113   BILIRUBINUR n 11/27/2016 1129   KETONESUR NEGATIVE 06/22/2021 2113   PROTEINUR NEGATIVE 06/22/2021 2113   UROBILINOGEN negative (A) 11/27/2016 1129   UROBILINOGEN 0.2 12/01/2010 2352   NITRITE NEGATIVE 06/22/2021 2113   LEUKOCYTESUR NEGATIVE 06/22/2021 2113   Sepsis Labs Invalid input(s): PROCALCITONIN,  WBC,  LACTICIDVEN Microbiology Recent Results (from the past 240 hour(s))  Resp Panel by RT-PCR (Flu A&B, Covid)     Status: None   Collection Time: 06/22/21  8:20 PM   Specimen: Nasopharyngeal(NP) swabs in vial transport medium  Result Value Ref Range Status   SARS Coronavirus 2 by RT PCR  NEGATIVE NEGATIVE Final    Comment: (NOTE) SARS-CoV-2 target nucleic acids are NOT DETECTED.  The SARS-CoV-2 RNA is generally detectable in upper respiratory specimens during the acute phase of infection. The lowest concentration of SARS-CoV-2 viral copies this assay can detect is 138 copies/mL. A negative result does not preclude SARS-Cov-2 infection and should not be used as the sole basis for treatment or other patient management decisions. A negative result may occur with  improper specimen collection/handling, submission of specimen other than nasopharyngeal swab, presence of viral mutation(s) within the areas targeted by this assay, and inadequate number of viral copies(<138 copies/mL). A negative  result must be combined with clinical observations, patient history, and epidemiological information. The expected result is Negative.  Fact Sheet for Patients:  EntrepreneurPulse.com.au  Fact Sheet for Healthcare Providers:  IncredibleEmployment.be  This test is no t yet approved or cleared by the [place] FDA and  has been authorized for detection and/or diagnosis of SARS-CoV-2 by FDA under an Emergency Use Authorization (EUA). This EUA will remain  in effect (meaning this test can be used) for the duration of the COVID-19 declaration under Section 564(b)(1) of the Act, 21 U.S.C.section 360bbb-3(b)(1), unless the authorization is terminated  or revoked sooner.       Influenza A by PCR NEGATIVE NEGATIVE Final   Influenza B by PCR NEGATIVE NEGATIVE Final    Comment: (NOTE) The Xpert Xpress SARS-CoV-2/FLU/RSV plus assay is intended as an aid in the diagnosis of influenza from Nasopharyngeal swab specimens and should not be used as a sole basis for treatment. Nasal washings and aspirates are unacceptable for Xpert Xpress SARS-CoV-2/FLU/RSV testing.  Fact Sheet for Patients: EntrepreneurPulse.com.au  Fact Sheet for  Healthcare Providers: IncredibleEmployment.be  This test is not yet approved or cleared by the [place] FDA and has been authorized for detection and/or diagnosis of SARS-CoV-2 by FDA under an Emergency Use Authorization (EUA). This EUA will remain in effect (meaning this test can be used) for the duration of the COVID-19 declaration under Section 564(b)(1) of the Act, 21 U.S.C. section 360bbb-3(b)(1), unless the authorization is terminated or revoked.  Performed at [facility], [place] [address], [place], [place] [address]      SIGNED:   Charlynne Cousins, MD  [facility] 06/30/2021, 10:30 AM Pager   If 7PM-7AM, please contact night-coverage [email or web address] Password [email or web address]

## 2021-07-01 DIAGNOSIS — E1169 Type 2 diabetes mellitus with other specified complication: Secondary | ICD-10-CM | POA: Diagnosis not present

## 2021-07-01 DIAGNOSIS — M5416 Radiculopathy, lumbar region: Secondary | ICD-10-CM | POA: Diagnosis not present

## 2021-07-01 DIAGNOSIS — E1159 Type 2 diabetes mellitus with other circulatory complications: Secondary | ICD-10-CM | POA: Diagnosis not present

## 2021-07-01 DIAGNOSIS — M48061 Spinal stenosis, lumbar region without neurogenic claudication: Secondary | ICD-10-CM | POA: Diagnosis not present

## 2021-07-01 DIAGNOSIS — I4891 Unspecified atrial fibrillation: Secondary | ICD-10-CM | POA: Diagnosis not present

## 2021-07-01 DIAGNOSIS — I4892 Unspecified atrial flutter: Secondary | ICD-10-CM | POA: Diagnosis not present

## 2021-07-01 DIAGNOSIS — R5381 Other malaise: Secondary | ICD-10-CM | POA: Diagnosis not present

## 2021-07-01 DIAGNOSIS — I152 Hypertension secondary to endocrine disorders: Secondary | ICD-10-CM | POA: Diagnosis not present

## 2021-07-01 DIAGNOSIS — E669 Obesity, unspecified: Secondary | ICD-10-CM | POA: Diagnosis not present

## 2021-07-05 DIAGNOSIS — L89626 Pressure-induced deep tissue damage of left heel: Secondary | ICD-10-CM | POA: Diagnosis not present

## 2021-07-05 DIAGNOSIS — I4891 Unspecified atrial fibrillation: Secondary | ICD-10-CM | POA: Diagnosis not present

## 2021-07-05 DIAGNOSIS — E1159 Type 2 diabetes mellitus with other circulatory complications: Secondary | ICD-10-CM | POA: Diagnosis not present

## 2021-07-05 DIAGNOSIS — R5381 Other malaise: Secondary | ICD-10-CM | POA: Diagnosis not present

## 2021-07-05 DIAGNOSIS — L89620 Pressure ulcer of left heel, unstageable: Secondary | ICD-10-CM | POA: Diagnosis not present

## 2021-07-05 DIAGNOSIS — I152 Hypertension secondary to endocrine disorders: Secondary | ICD-10-CM | POA: Diagnosis not present

## 2021-07-05 DIAGNOSIS — M48061 Spinal stenosis, lumbar region without neurogenic claudication: Secondary | ICD-10-CM | POA: Diagnosis not present

## 2021-07-05 DIAGNOSIS — Z1331 Encounter for screening for depression: Secondary | ICD-10-CM | POA: Diagnosis not present

## 2021-07-08 DIAGNOSIS — E1169 Type 2 diabetes mellitus with other specified complication: Secondary | ICD-10-CM | POA: Diagnosis not present

## 2021-07-08 DIAGNOSIS — M5416 Radiculopathy, lumbar region: Secondary | ICD-10-CM | POA: Diagnosis not present

## 2021-07-08 DIAGNOSIS — M48061 Spinal stenosis, lumbar region without neurogenic claudication: Secondary | ICD-10-CM | POA: Diagnosis not present

## 2021-07-08 DIAGNOSIS — L89620 Pressure ulcer of left heel, unstageable: Secondary | ICD-10-CM | POA: Diagnosis not present

## 2021-07-11 ENCOUNTER — Ambulatory Visit: Payer: Medicare PPO | Admitting: Internal Medicine

## 2021-07-12 DIAGNOSIS — I152 Hypertension secondary to endocrine disorders: Secondary | ICD-10-CM | POA: Diagnosis not present

## 2021-07-12 DIAGNOSIS — L89626 Pressure-induced deep tissue damage of left heel: Secondary | ICD-10-CM | POA: Diagnosis not present

## 2021-07-12 DIAGNOSIS — M48061 Spinal stenosis, lumbar region without neurogenic claudication: Secondary | ICD-10-CM | POA: Diagnosis not present

## 2021-07-12 DIAGNOSIS — L89620 Pressure ulcer of left heel, unstageable: Secondary | ICD-10-CM | POA: Diagnosis not present

## 2021-07-12 DIAGNOSIS — E1169 Type 2 diabetes mellitus with other specified complication: Secondary | ICD-10-CM | POA: Diagnosis not present

## 2021-07-13 ENCOUNTER — Other Ambulatory Visit: Payer: Self-pay | Admitting: Internal Medicine

## 2021-07-13 ENCOUNTER — Ambulatory Visit: Payer: Medicare PPO | Admitting: Podiatry

## 2021-07-28 DIAGNOSIS — L89626 Pressure-induced deep tissue damage of left heel: Secondary | ICD-10-CM | POA: Diagnosis not present

## 2021-08-02 DIAGNOSIS — B9620 Unspecified Escherichia coli [E. coli] as the cause of diseases classified elsewhere: Secondary | ICD-10-CM | POA: Diagnosis not present

## 2021-08-02 DIAGNOSIS — M48061 Spinal stenosis, lumbar region without neurogenic claudication: Secondary | ICD-10-CM | POA: Diagnosis not present

## 2021-08-02 DIAGNOSIS — G4700 Insomnia, unspecified: Secondary | ICD-10-CM | POA: Diagnosis not present

## 2021-08-02 DIAGNOSIS — N390 Urinary tract infection, site not specified: Secondary | ICD-10-CM | POA: Diagnosis not present

## 2021-08-02 DIAGNOSIS — L97422 Non-pressure chronic ulcer of left heel and midfoot with fat layer exposed: Secondary | ICD-10-CM | POA: Diagnosis not present

## 2021-08-02 DIAGNOSIS — E1169 Type 2 diabetes mellitus with other specified complication: Secondary | ICD-10-CM | POA: Diagnosis not present

## 2021-08-02 DIAGNOSIS — I4891 Unspecified atrial fibrillation: Secondary | ICD-10-CM | POA: Diagnosis not present

## 2021-08-02 DIAGNOSIS — I152 Hypertension secondary to endocrine disorders: Secondary | ICD-10-CM | POA: Diagnosis not present

## 2021-08-16 DIAGNOSIS — L97422 Non-pressure chronic ulcer of left heel and midfoot with fat layer exposed: Secondary | ICD-10-CM | POA: Diagnosis not present

## 2021-08-16 DIAGNOSIS — Z86711 Personal history of pulmonary embolism: Secondary | ICD-10-CM | POA: Diagnosis not present

## 2021-08-16 DIAGNOSIS — R41841 Cognitive communication deficit: Secondary | ICD-10-CM | POA: Diagnosis not present

## 2021-08-16 DIAGNOSIS — M48061 Spinal stenosis, lumbar region without neurogenic claudication: Secondary | ICD-10-CM | POA: Diagnosis not present

## 2021-08-16 DIAGNOSIS — R2689 Other abnormalities of gait and mobility: Secondary | ICD-10-CM | POA: Diagnosis not present

## 2021-08-16 DIAGNOSIS — M6281 Muscle weakness (generalized): Secondary | ICD-10-CM | POA: Diagnosis not present

## 2021-08-16 DIAGNOSIS — R262 Difficulty in walking, not elsewhere classified: Secondary | ICD-10-CM | POA: Diagnosis not present

## 2021-08-17 DIAGNOSIS — R2689 Other abnormalities of gait and mobility: Secondary | ICD-10-CM | POA: Diagnosis not present

## 2021-08-17 DIAGNOSIS — Z86711 Personal history of pulmonary embolism: Secondary | ICD-10-CM | POA: Diagnosis not present

## 2021-08-17 DIAGNOSIS — M48061 Spinal stenosis, lumbar region without neurogenic claudication: Secondary | ICD-10-CM | POA: Diagnosis not present

## 2021-08-17 DIAGNOSIS — R41841 Cognitive communication deficit: Secondary | ICD-10-CM | POA: Diagnosis not present

## 2021-08-17 DIAGNOSIS — R262 Difficulty in walking, not elsewhere classified: Secondary | ICD-10-CM | POA: Diagnosis not present

## 2021-08-17 DIAGNOSIS — M6281 Muscle weakness (generalized): Secondary | ICD-10-CM | POA: Diagnosis not present

## 2021-08-18 DIAGNOSIS — R2689 Other abnormalities of gait and mobility: Secondary | ICD-10-CM | POA: Diagnosis not present

## 2021-08-18 DIAGNOSIS — Z86711 Personal history of pulmonary embolism: Secondary | ICD-10-CM | POA: Diagnosis not present

## 2021-08-18 DIAGNOSIS — R41841 Cognitive communication deficit: Secondary | ICD-10-CM | POA: Diagnosis not present

## 2021-08-18 DIAGNOSIS — E11621 Type 2 diabetes mellitus with foot ulcer: Secondary | ICD-10-CM | POA: Diagnosis not present

## 2021-08-18 DIAGNOSIS — M6281 Muscle weakness (generalized): Secondary | ICD-10-CM | POA: Diagnosis not present

## 2021-08-18 DIAGNOSIS — R262 Difficulty in walking, not elsewhere classified: Secondary | ICD-10-CM | POA: Diagnosis not present

## 2021-08-18 DIAGNOSIS — M48061 Spinal stenosis, lumbar region without neurogenic claudication: Secondary | ICD-10-CM | POA: Diagnosis not present

## 2021-08-23 DIAGNOSIS — R262 Difficulty in walking, not elsewhere classified: Secondary | ICD-10-CM | POA: Diagnosis not present

## 2021-08-23 DIAGNOSIS — Z86711 Personal history of pulmonary embolism: Secondary | ICD-10-CM | POA: Diagnosis not present

## 2021-08-23 DIAGNOSIS — E1169 Type 2 diabetes mellitus with other specified complication: Secondary | ICD-10-CM | POA: Diagnosis not present

## 2021-08-23 DIAGNOSIS — M48061 Spinal stenosis, lumbar region without neurogenic claudication: Secondary | ICD-10-CM | POA: Diagnosis not present

## 2021-08-23 DIAGNOSIS — E669 Obesity, unspecified: Secondary | ICD-10-CM | POA: Diagnosis not present

## 2021-08-23 DIAGNOSIS — R2689 Other abnormalities of gait and mobility: Secondary | ICD-10-CM | POA: Diagnosis not present

## 2021-08-23 DIAGNOSIS — M5416 Radiculopathy, lumbar region: Secondary | ICD-10-CM | POA: Diagnosis not present

## 2021-08-23 DIAGNOSIS — I4892 Unspecified atrial flutter: Secondary | ICD-10-CM | POA: Diagnosis not present

## 2021-08-23 DIAGNOSIS — I4891 Unspecified atrial fibrillation: Secondary | ICD-10-CM | POA: Diagnosis not present

## 2021-08-23 DIAGNOSIS — E1159 Type 2 diabetes mellitus with other circulatory complications: Secondary | ICD-10-CM | POA: Diagnosis not present

## 2021-08-23 DIAGNOSIS — L97422 Non-pressure chronic ulcer of left heel and midfoot with fat layer exposed: Secondary | ICD-10-CM | POA: Diagnosis not present

## 2021-08-23 DIAGNOSIS — M6281 Muscle weakness (generalized): Secondary | ICD-10-CM | POA: Diagnosis not present

## 2021-08-23 DIAGNOSIS — I152 Hypertension secondary to endocrine disorders: Secondary | ICD-10-CM | POA: Diagnosis not present

## 2021-08-23 DIAGNOSIS — R41841 Cognitive communication deficit: Secondary | ICD-10-CM | POA: Diagnosis not present

## 2021-08-23 DIAGNOSIS — R5381 Other malaise: Secondary | ICD-10-CM | POA: Diagnosis not present

## 2021-08-24 DIAGNOSIS — R41841 Cognitive communication deficit: Secondary | ICD-10-CM | POA: Diagnosis not present

## 2021-08-24 DIAGNOSIS — R2689 Other abnormalities of gait and mobility: Secondary | ICD-10-CM | POA: Diagnosis not present

## 2021-08-24 DIAGNOSIS — M6281 Muscle weakness (generalized): Secondary | ICD-10-CM | POA: Diagnosis not present

## 2021-08-24 DIAGNOSIS — Z86711 Personal history of pulmonary embolism: Secondary | ICD-10-CM | POA: Diagnosis not present

## 2021-08-24 DIAGNOSIS — M48061 Spinal stenosis, lumbar region without neurogenic claudication: Secondary | ICD-10-CM | POA: Diagnosis not present

## 2021-08-24 DIAGNOSIS — R262 Difficulty in walking, not elsewhere classified: Secondary | ICD-10-CM | POA: Diagnosis not present

## 2021-08-25 ENCOUNTER — Inpatient Hospital Stay (HOSPITAL_COMMUNITY)
Admission: EM | Admit: 2021-08-25 | Discharge: 2021-08-29 | DRG: 177 | Disposition: A | Discharge status: Skilled Nursing Facility | Payer: Medicare PPO | Source: Skilled Nursing Facility | Attending: Internal Medicine | Admitting: Internal Medicine

## 2021-08-25 ENCOUNTER — Emergency Department (HOSPITAL_COMMUNITY): Payer: Medicare PPO

## 2021-08-25 DIAGNOSIS — R410 Disorientation, unspecified: Secondary | ICD-10-CM

## 2021-08-25 DIAGNOSIS — G9341 Metabolic encephalopathy: Secondary | ICD-10-CM | POA: Diagnosis present

## 2021-08-25 DIAGNOSIS — I10 Essential (primary) hypertension: Secondary | ICD-10-CM | POA: Diagnosis present

## 2021-08-25 DIAGNOSIS — Z7901 Long term (current) use of anticoagulants: Secondary | ICD-10-CM | POA: Diagnosis not present

## 2021-08-25 DIAGNOSIS — I482 Chronic atrial fibrillation, unspecified: Secondary | ICD-10-CM | POA: Diagnosis present

## 2021-08-25 DIAGNOSIS — M25669 Stiffness of unspecified knee, not elsewhere classified: Secondary | ICD-10-CM | POA: Diagnosis not present

## 2021-08-25 DIAGNOSIS — Z8249 Family history of ischemic heart disease and other diseases of the circulatory system: Secondary | ICD-10-CM

## 2021-08-25 DIAGNOSIS — R269 Unspecified abnormalities of gait and mobility: Secondary | ICD-10-CM | POA: Diagnosis present

## 2021-08-25 DIAGNOSIS — N3000 Acute cystitis without hematuria: Secondary | ICD-10-CM | POA: Diagnosis present

## 2021-08-25 DIAGNOSIS — M48061 Spinal stenosis, lumbar region without neurogenic claudication: Secondary | ICD-10-CM | POA: Diagnosis present

## 2021-08-25 DIAGNOSIS — Z1331 Encounter for screening for depression: Secondary | ICD-10-CM | POA: Diagnosis not present

## 2021-08-25 DIAGNOSIS — J069 Acute upper respiratory infection, unspecified: Secondary | ICD-10-CM | POA: Diagnosis not present

## 2021-08-25 DIAGNOSIS — L89623 Pressure ulcer of left heel, stage 3: Secondary | ICD-10-CM | POA: Diagnosis present

## 2021-08-25 DIAGNOSIS — E669 Obesity, unspecified: Secondary | ICD-10-CM | POA: Diagnosis present

## 2021-08-25 DIAGNOSIS — Z86711 Personal history of pulmonary embolism: Secondary | ICD-10-CM

## 2021-08-25 DIAGNOSIS — D320 Benign neoplasm of cerebral meninges: Secondary | ICD-10-CM | POA: Diagnosis present

## 2021-08-25 DIAGNOSIS — D638 Anemia in other chronic diseases classified elsewhere: Secondary | ICD-10-CM | POA: Diagnosis present

## 2021-08-25 DIAGNOSIS — R600 Localized edema: Secondary | ICD-10-CM | POA: Diagnosis not present

## 2021-08-25 DIAGNOSIS — I700 Atherosclerosis of aorta: Secondary | ICD-10-CM | POA: Diagnosis not present

## 2021-08-25 DIAGNOSIS — Z6837 Body mass index (BMI) 37.0-37.9, adult: Secondary | ICD-10-CM

## 2021-08-25 DIAGNOSIS — Z7984 Long term (current) use of oral hypoglycemic drugs: Secondary | ICD-10-CM

## 2021-08-25 DIAGNOSIS — Z794 Long term (current) use of insulin: Secondary | ICD-10-CM

## 2021-08-25 DIAGNOSIS — R29810 Facial weakness: Secondary | ICD-10-CM | POA: Diagnosis present

## 2021-08-25 DIAGNOSIS — R059 Cough, unspecified: Secondary | ICD-10-CM | POA: Diagnosis present

## 2021-08-25 DIAGNOSIS — M47814 Spondylosis without myelopathy or radiculopathy, thoracic region: Secondary | ICD-10-CM | POA: Diagnosis not present

## 2021-08-25 DIAGNOSIS — R262 Difficulty in walking, not elsewhere classified: Secondary | ICD-10-CM | POA: Diagnosis present

## 2021-08-25 DIAGNOSIS — Z7401 Bed confinement status: Secondary | ICD-10-CM | POA: Diagnosis not present

## 2021-08-25 DIAGNOSIS — U071 COVID-19: Secondary | ICD-10-CM | POA: Diagnosis present

## 2021-08-25 DIAGNOSIS — K219 Gastro-esophageal reflux disease without esophagitis: Secondary | ICD-10-CM | POA: Diagnosis present

## 2021-08-25 DIAGNOSIS — I4892 Unspecified atrial flutter: Secondary | ICD-10-CM | POA: Diagnosis present

## 2021-08-25 DIAGNOSIS — R0989 Other specified symptoms and signs involving the circulatory and respiratory systems: Secondary | ICD-10-CM | POA: Diagnosis not present

## 2021-08-25 DIAGNOSIS — E1169 Type 2 diabetes mellitus with other specified complication: Secondary | ICD-10-CM | POA: Diagnosis present

## 2021-08-25 DIAGNOSIS — N390 Urinary tract infection, site not specified: Secondary | ICD-10-CM

## 2021-08-25 DIAGNOSIS — L89629 Pressure ulcer of left heel, unspecified stage: Secondary | ICD-10-CM | POA: Diagnosis present

## 2021-08-25 DIAGNOSIS — Z5309 Procedure and treatment not carried out because of other contraindication: Secondary | ICD-10-CM | POA: Diagnosis not present

## 2021-08-25 DIAGNOSIS — R4781 Slurred speech: Secondary | ICD-10-CM | POA: Diagnosis present

## 2021-08-25 DIAGNOSIS — I152 Hypertension secondary to endocrine disorders: Secondary | ICD-10-CM | POA: Diagnosis not present

## 2021-08-25 DIAGNOSIS — R2689 Other abnormalities of gait and mobility: Secondary | ICD-10-CM | POA: Diagnosis not present

## 2021-08-25 DIAGNOSIS — E785 Hyperlipidemia, unspecified: Secondary | ICD-10-CM | POA: Diagnosis present

## 2021-08-25 DIAGNOSIS — R41841 Cognitive communication deficit: Secondary | ICD-10-CM | POA: Diagnosis not present

## 2021-08-25 DIAGNOSIS — R4182 Altered mental status, unspecified: Secondary | ICD-10-CM | POA: Diagnosis not present

## 2021-08-25 DIAGNOSIS — Z79899 Other long term (current) drug therapy: Secondary | ICD-10-CM

## 2021-08-25 DIAGNOSIS — R4701 Aphasia: Secondary | ICD-10-CM | POA: Diagnosis not present

## 2021-08-25 DIAGNOSIS — I6782 Cerebral ischemia: Secondary | ICD-10-CM | POA: Diagnosis not present

## 2021-08-25 DIAGNOSIS — M6281 Muscle weakness (generalized): Secondary | ICD-10-CM | POA: Diagnosis not present

## 2021-08-25 DIAGNOSIS — R29818 Other symptoms and signs involving the nervous system: Secondary | ICD-10-CM | POA: Diagnosis not present

## 2021-08-25 DIAGNOSIS — M1990 Unspecified osteoarthritis, unspecified site: Secondary | ICD-10-CM | POA: Diagnosis not present

## 2021-08-25 LAB — COMPREHENSIVE METABOLIC PANEL
ALT: 25 U/L (ref 0–44)
AST: 33 U/L (ref 15–41)
Albumin: 2.8 g/dL — ABNORMAL LOW (ref 3.5–5.0)
Alkaline Phosphatase: 82 U/L (ref 38–126)
Anion gap: 9 (ref 5–15)
BUN: 17 mg/dL (ref 8–23)
CO2: 22 mmol/L (ref 22–32)
Calcium: 8.5 mg/dL — ABNORMAL LOW (ref 8.9–10.3)
Chloride: 105 mmol/L (ref 98–111)
Creatinine, Ser: 0.88 mg/dL (ref 0.44–1.00)
GFR, Estimated: >60 mL/min (ref >60)
Glucose, Bld: 123 mg/dL — ABNORMAL HIGH (ref 70–99)
Potassium: 4.2 mmol/L (ref 3.5–5.1)
Sodium: 136 mmol/L (ref 135–145)
Total Bilirubin: 0.5 mg/dL (ref 0.3–1.2)
Total Protein: 6.3 g/dL — ABNORMAL LOW (ref 6.5–8.1)

## 2021-08-25 LAB — CBC WITH DIFFERENTIAL/PLATELET
Abs Immature Granulocytes: 0.02 10*3/uL (ref 0.00–0.07)
Basophils Absolute: 0 10*3/uL (ref 0.0–0.1)
Basophils Relative: 1 %
Eosinophils Absolute: 0 10*3/uL (ref 0.0–0.5)
Eosinophils Relative: 0 %
HCT: 28.8 % — ABNORMAL LOW (ref 36.0–46.0)
Hemoglobin: 9 g/dL — ABNORMAL LOW (ref 12.0–15.0)
Immature Granulocytes: 0 %
Lymphocytes Relative: 15 %
Lymphs Abs: 0.9 10*3/uL (ref 0.7–4.0)
MCH: 26.2 pg (ref 26.0–34.0)
MCHC: 31.3 g/dL (ref 30.0–36.0)
MCV: 83.7 fL (ref 80.0–100.0)
Monocytes Absolute: 0.4 10*3/uL (ref 0.1–1.0)
Monocytes Relative: 7 %
Neutro Abs: 4.6 10*3/uL (ref 1.7–7.7)
Neutrophils Relative %: 77 %
Platelets: 238 10*3/uL (ref 150–400)
RBC: 3.44 MIL/uL — ABNORMAL LOW (ref 3.87–5.11)
RDW: 16.6 % — ABNORMAL HIGH (ref 11.5–15.5)
WBC: 6.1 10*3/uL (ref 4.0–10.5)
nRBC: 0 % (ref 0.0–0.2)

## 2021-08-25 LAB — PROTIME-INR
INR: 1.5 — ABNORMAL HIGH (ref 0.8–1.2)
Prothrombin Time: 18.2 seconds — ABNORMAL HIGH (ref 11.4–15.2)

## 2021-08-25 LAB — APTT: aPTT: 35 seconds (ref 24–36)

## 2021-08-25 LAB — LACTIC ACID, PLASMA: Lactic Acid, Venous: 0.9 mmol/L (ref 0.5–1.9)

## 2021-08-25 MED ORDER — ACETAMINOPHEN 325 MG PO TABS
650.0000 mg | ORAL_TABLET | Freq: Once | ORAL | Status: AC
  Administered 2021-08-25: 650 mg via ORAL
  Filled 2021-08-25: qty 2

## 2021-08-25 NOTE — ED Notes (Signed)
Kayla [name], son, [phone] would like updates when available

## 2021-08-25 NOTE — ED Triage Notes (Signed)
Pt BIB EMS from [facility]. Pt is there for a wound on her left leg. Staff went to do an assessment on pt today and found that pt had left sided facial droop, slurred speech, and some AMS. LSN was yesterday afternoon. Symptoms resolved with EMS.  Pt hot to touch. VS 147/71 HR 110 94% RA cbg 162 500 of NS given en route

## 2021-08-25 NOTE — ED Notes (Signed)
Patient transported to CT 

## 2021-08-25 NOTE — ED Provider Notes (Signed)
[facility] EMERGENCY DEPARTMENT Provider Note   CSN: [id number] Arrival date & time: 08/25/21  2206     History  Chief Complaint  Patient presents with   Stroke Like Symptoms     L sided facial droop, left sided weakness, slurred speech, AMS     Kayla [name] is a 81 y.o. female.  The history is provided by the patient and medical records.  Kayla [name] is a 81 y.o. female who presents to the Emergency Department complaining of facial droop.  Level V caveat due to AMS.  History is provided by EMS, the patient and the patient's son after patient's initial ED evaluation.  She is currently a resident at [facility].  Today when her son was talking to her on the phone she seemed to have a difficulty speaking with nasal congestion and cough.  Later he was contacted by the facility who stated she was having difficulty speaking and a significant change in her mental status, possible left-sided facial droop.  He states that when he talked to her the day previously she was at her baseline state of health.  On ED presentation patient with fever, no reports of fever prior to this.  Patient does report that she felt like she was talking differently earlier, no additional acute complaints.  She does report intermittent left lower extremity pain but this is an ongoing issue, no significant change from baseline. She was recently treated with Keflex 500 mg twice daily from January 3 through 9 for urinary tract infection.  She states she was having urinary symptoms at that time.  She has a history of hypertension, diabetes, severe spinal stenosis, which limits mobility.     Home Medications Prior to Admission medications   Medication Sig Start Date End Date Taking? Authorizing Provider  acetaminophen (TYLENOL) 325 MG tablet Take 650 mg by mouth every 8 (eight) hours as needed for mild pain or moderate pain.   Yes [provider]  acetaminophen (TYLENOL) 500  MG tablet Take 500 mg by mouth 2 (two) times daily.   Yes [provider]  apixaban (ELIQUIS) 5 MG TABS tablet Take 1 tablet (5 mg total) by mouth 2 (two) times daily. 05/23/21 08/26/21 Yes Denita Lung, MD  atorvastatin (LIPITOR) 80 MG tablet Take 1 tablet (80 mg total) by mouth daily. Patient taking differently: Take 80 mg by mouth at bedtime. 01/20/20  Yes Denita Lung, MD  carvedilol (COREG) 6.25 MG tablet Take 1 tablet (6.25 mg total) by mouth 2 (two) times daily. 03/31/21  Yes Chandrasekhar, Mahesh A, MD  collagenase (SANTYL) ointment Apply 1 application topically daily.   Yes [provider]  diclofenac Sodium (VOLTAREN) 1 % GEL Apply 2 g topically 4 (four) times daily. Rub into affected area of foot 2 to 4 times daily Patient taking differently: Apply 2 g topically 4 (four) times daily as needed (pain). 03/31/21  Yes Tysinger, Camelia Eng, PA-C  docusate sodium (COLACE) 100 MG capsule Take 100 mg by mouth daily.   Yes [provider]  ezetimibe (ZETIA) 10 MG tablet Take 1 tablet (10 mg total) by mouth daily. Patient taking differently: Take 10 mg by mouth at bedtime. 05/23/21 08/26/21 Yes Denita Lung, MD  ferrous gluconate (FERGON) 324 MG tablet Take 324 mg by mouth 2 (two) times daily.   Yes [provider]  fluticasone (FLONASE) 50 MCG/ACT nasal spray Place 1 spray into both nostrils daily.   Yes  [provider]  gabapentin (NEURONTIN) 100 MG capsule Take 2 capsules (200 mg total) by mouth 3 (three) times daily. Patient taking differently: Take 200 mg by mouth 2 (two) times daily. 06/30/21  Yes Charlynne Cousins, MD  guaiFENesin (MUCINEX) 600 MG 12 hr tablet Take 600 mg by mouth 2 (two) times daily.   Yes [provider]  lidocaine 4 % dressing Apply 2 patches topically daily.   Yes [provider]  loratadine (CLARITIN) 10 MG tablet Take 10 mg by mouth daily.   Yes [provider]  losartan (COZAAR) 50 MG tablet  Take 1 tablet (50 mg total) by mouth daily. Patient taking differently: Take 50 mg by mouth at bedtime. 05/23/21 08/26/21 Yes Denita Lung, MD  melatonin 5 MG TABS Take 5 mg by mouth at bedtime as needed (sleep).   Yes [provider]  metFORMIN (GLUCOPHAGE) 500 MG tablet Take 1 tablet (500 mg total) by mouth 2 (two) times daily with a meal. 12/28/20  Yes Denita Lung, MD  TRIAMCINOLONE ACETONIDE, TOP, 0.05 % OINT Apply 1 application topically 2 (two) times daily as needed (rough skin on heels). 03/31/21  Yes Tysinger, Camelia Eng, PA-C  Accu-Chek Softclix Lancets lancets 1 each by Other route daily. Use as instructed 03/30/20   Denita Lung, MD  acetaminophen (TYLENOL) 650 MG CR tablet Take 650 mg by mouth every 8 (eight) hours as needed for pain. Patient not taking: Reported on 08/26/2021    [provider]  ammonium lactate (LAC-HYDRIN) 12 % lotion Apply 1 application topically as needed for dry skin. Patient not taking: Reported on 08/26/2021 03/31/21   Tysinger, Camelia Eng, PA-C  carbamide peroxide (DEBROX) 6.5 % OTIC solution Place 1-2 drops into both ears daily as needed (wax build up). Patient not taking: Reported on 08/26/2021    [provider]  furosemide (LASIX) 20 MG tablet TAKE 1 TABLET (20 MG TOTAL) BY MOUTH DAILY AS NEEDED (TAKE AS NEEDED FOR SWELLING). Patient not taking: Reported on 08/26/2021 07/13/21   Rudean Haskell A, MD  glucose blood test strip 1 each by Other route as needed for other. Use as instructed Patient taking differently: 1 each by Other route daily. Use as instructed 12/31/20   Denita Lung, MD  insulin detemir (LEVEMIR) 100 UNIT/ML injection Inject 0.08 mLs (8 Units total) into the skin daily for 10 days. Continue while on steroids. 07/01/21 07/11/21  Charlynne Cousins, MD  nitroGLYCERIN (NITROSTAT) 0.4 MG SL tablet Take up to 3 tablets Patient taking differently: 0.4 mg every 5 (five) minutes as needed for chest pain. 03/31/21    Chandrasekhar, Terisa Starr, MD  predniSONE (DELTASONE) 10 MG tablet Takes 6 tablets for 1 days, then 5 tablets for 1 days, then 4 tablets for 1 days, then 3 tablets for 1 days, then 2 tabs for 1 days, then 1 tab for 1 days, and then stop. Patient not taking: Reported on 08/26/2021 06/30/21   Charlynne Cousins, MD      Allergies    Patient has no known allergies.    Review of Systems   Review of Systems  All other systems reviewed and are negative.  Physical Exam Updated Vital Signs BP (!) 123/43    Pulse 85    Temp 100.3 F (37.9 C) (Oral)    Resp 20    SpO2 97%  Physical Exam Vitals and nursing note reviewed.  Constitutional:      Appearance: She is well-developed.  HENT:     Head: Normocephalic and atraumatic.  Cardiovascular:     Rate and Rhythm: Normal rate and regular rhythm.  Pulmonary:     Effort: Pulmonary effort is normal. No respiratory distress.  Abdominal:     Palpations: Abdomen is soft.     Tenderness: There is no abdominal tenderness. There is no guarding or rebound.  Musculoskeletal:        General: No tenderness.     Comments: There is a wound to the left heal with minimal local erythema.    Skin:    General: Skin is warm and dry.  Neurological:     Mental Status: She is alert and oriented to person, place, and time.     Comments: Mild left facial weakness.  Speech is fluent and clear.  5/5 strength in BUE.  4/5 strength in BLE.    Psychiatric:        Behavior: Behavior normal.    ED Results / Procedures / Treatments   Labs (all labs ordered are listed, but only abnormal results are displayed) Labs Reviewed  RESP PANEL BY RT-PCR (FLU A&B, COVID) ARPGX2 - Abnormal; Notable for the following components:      Result Value   SARS Coronavirus 2 by RT PCR POSITIVE (*)    All other components within normal limits  COMPREHENSIVE METABOLIC PANEL - Abnormal; Notable for the following components:   Glucose, Bld 123 (*)    Calcium 8.5 (*)    Total Protein 6.3  (*)    Albumin 2.8 (*)    All other components within normal limits  CBC WITH DIFFERENTIAL/PLATELET - Abnormal; Notable for the following components:   RBC 3.44 (*)    Hemoglobin 9.0 (*)    HCT 28.8 (*)    RDW 16.6 (*)    All other components within normal limits  PROTIME-INR - Abnormal; Notable for the following components:   Prothrombin Time 18.2 (*)    INR 1.5 (*)    All other components within normal limits  URINALYSIS, ROUTINE W REFLEX MICROSCOPIC - Abnormal; Notable for the following components:   APPearance TURBID (*)    Hgb urine dipstick MODERATE (*)    Protein, ur 100 (*)    Nitrite POSITIVE (*)    Leukocytes,Ua LARGE (*)    WBC, UA >50 (*)    Bacteria, UA RARE (*)    Non Squamous Epithelial 0-5 (*)    All other components within normal limits  CULTURE, BLOOD (ROUTINE X 2)  CULTURE, BLOOD (ROUTINE X 2)  URINE CULTURE  LACTIC ACID, PLASMA  APTT  LACTIC ACID, PLASMA    EKG EKG Interpretation  Date/Time:  Thursday August 25 2021 23:40:30 EST Ventricular Rate:  95 PR Interval:  149 QRS Duration: 85 QT Interval:  371 QTC Calculation: 467 R Axis:   -11 Text Interpretation: Sinus rhythm Left ventricular hypertrophy When compared with ECG of 03/08/2021, No significant change was found Confirmed by Delora Fuel ([id number]) on 08/26/2021 6:03:50 AM  Radiology CT HEAD WO CONTRAST (5MM)  Result Date: 08/25/2021 CLINICAL DATA:  Mental status change. EXAM: CT HEAD WITHOUT CONTRAST TECHNIQUE: Contiguous axial images were obtained from the base of the skull through the vertex without intravenous contrast. RADIATION DOSE REDUCTION: This exam was performed according to the departmental dose-optimization program which includes automated exposure control, adjustment of the mA and/or kV according to patient size and/or use of iterative reconstruction technique. COMPARISON:  Remote head CT 02/01/2011 FINDINGS: Brain: No acute hemorrhage. Normal  for age atrophy. There is prominent  periventricular and deep white matter hypodensity typical of chronic small vessel ischemia. There is also mild subcortical low-density within both posterior occipital lobes. No evidence of acute infarct. Right greater than left basal gangliar mineralization, often senescent. No subdural or extra-axial collection. No midline shift or evident pulmonary mass. Vascular: No hyperdense vessel. Skull: Osseous density arising from the inner table of the inferior right frontal bone is stable from 20 2012 exam and considered benign. No skull fracture. Sinuses/Orbits: Occasional mucosal thickening of ethmoid air cells. The mastoid air cells are clear. No acute orbital findings. Other: None. IMPRESSION: Normal for age atrophy with prominent periventricular chronic small vessel ischemia. There is also symmetric low-density in the subcortical occipital lobes, likely representing chronic ischemic change, however the possibility of posterior reversible encephalopathy syndrome is also considered. As clinically indicated, recommend further assessment with MRI. Electronically Signed   By: Keith Rake M.D.   On: 08/25/2021 23:04   DG Chest Port 1 View  Result Date: 08/25/2021 CLINICAL DATA:  Concern for sepsis. EXAM: PORTABLE CHEST 1 VIEW COMPARISON:  Chest radiograph dated 03/05/2021. FINDINGS: No focal consolidation, pleural effusion, or pneumothorax. The cardiac silhouette is within normal limits. Atherosclerotic calcification of the aorta. No acute osseous pathology. Degenerative changes of the spine. IMPRESSION: No active cardiopulmonary disease. Electronically Signed   By: Anner Crete M.D.   On: 08/25/2021 23:35   DG Foot Complete Left  Result Date: 08/25/2021 CLINICAL DATA:  Skin wound over the heel. EXAM: LEFT FOOT - COMPLETE 3+ VIEW COMPARISON:  Left ankle radiograph dated 03/10/2021. FINDINGS: No acute fracture or dislocation. Mild osteopenia. There is hallux valgus. There is degenerative changes of the  ankle and metatarsal joints with spurring. There is ulceration of the skin over the healed. No radiopaque foreign object. There is diffuse soft tissue edema. IMPRESSION: 1. No acute fracture or dislocation. 2. Skin ulceration over the heel. 3. Diffuse soft tissue edema. 4. Hallux valgus. Electronically Signed   By: Anner Crete M.D.   On: 08/25/2021 23:36    Procedures Procedures    Medications Ordered in ED Medications  menthol-cetylpyridinium (CEPACOL) lozenge 3 mg (3 mg Oral Given 08/26/21 0543)  acetaminophen (TYLENOL) tablet 650 mg (650 mg Oral Given 08/25/21 2337)  sodium chloride 0.9 % bolus 500 mL (0 mLs Intravenous Stopped 08/26/21 0306)  aztreonam (AZACTAM) 2 g in sodium chloride 0.9 % 100 mL IVPB (2 g Intravenous New Bag/Given 08/26/21 0543)    ED Course/ Medical Decision Making/ A&P                           Medical Decision Making Amount and/or Complexity of Data Reviewed Labs: ordered. Radiology: ordered. ECG/medicine tests: ordered.  Risk OTC drugs. Decision regarding hospitalization.   Patient with history of diabetes, spinal stenosis, hypertension here for evaluation of altered mental status, last known well 24 hours previously.  Patient is alert and oriented with fluent speech on ED presentation, mild left-sided facial weakness.  She is also found to be febrile on ED presentation, no prehospital fevers per documentation from nursing facility.  Given her fever sepsis protocol was ordered.  Labs are near her baseline with stable anemia, normal lactate.  She did test positive for COVID-19, which was thought to be contributing to her fever.  She did have transient hypotension in the emergency department, which responded to IV fluids.  Later during ED stay her urinalysis returned back consistent  with urinary tract infection.  She was started on broad-spectrum antibiotics given her recent UTI at her facility.  Given her transient hypotension, altered mental status prior to ED  presentation as well as urinary tract infection and acute COVID-19 infection plan to admit for observation.  Current clinical picture is not consistent with sepsis, feel her hypotension was more likely secondary to COVID-19 infection over her acute UTI.  Discussed with patient's son via telephone call treatment plan. Hospitalist consulted for admission.          Final Clinical Impression(s) / ED Diagnoses Final diagnoses:  None    Rx / DC Orders ED Discharge Orders     None         Quintella Reichert, MD 08/26/21 [phone]

## 2021-08-26 ENCOUNTER — Inpatient Hospital Stay (HOSPITAL_COMMUNITY): Payer: Medicare PPO

## 2021-08-26 ENCOUNTER — Encounter (HOSPITAL_COMMUNITY): Payer: Self-pay | Admitting: Internal Medicine

## 2021-08-26 ENCOUNTER — Other Ambulatory Visit: Payer: Self-pay

## 2021-08-26 DIAGNOSIS — N390 Urinary tract infection, site not specified: Secondary | ICD-10-CM | POA: Diagnosis present

## 2021-08-26 DIAGNOSIS — L89629 Pressure ulcer of left heel, unspecified stage: Secondary | ICD-10-CM

## 2021-08-26 DIAGNOSIS — G9341 Metabolic encephalopathy: Secondary | ICD-10-CM

## 2021-08-26 DIAGNOSIS — E669 Obesity, unspecified: Secondary | ICD-10-CM | POA: Diagnosis present

## 2021-08-26 DIAGNOSIS — Z86711 Personal history of pulmonary embolism: Secondary | ICD-10-CM | POA: Diagnosis not present

## 2021-08-26 DIAGNOSIS — N3000 Acute cystitis without hematuria: Secondary | ICD-10-CM | POA: Diagnosis present

## 2021-08-26 DIAGNOSIS — E785 Hyperlipidemia, unspecified: Secondary | ICD-10-CM

## 2021-08-26 DIAGNOSIS — E1169 Type 2 diabetes mellitus with other specified complication: Secondary | ICD-10-CM | POA: Diagnosis present

## 2021-08-26 DIAGNOSIS — R29810 Facial weakness: Secondary | ICD-10-CM | POA: Diagnosis present

## 2021-08-26 DIAGNOSIS — I10 Essential (primary) hypertension: Secondary | ICD-10-CM | POA: Diagnosis present

## 2021-08-26 DIAGNOSIS — U071 COVID-19: Secondary | ICD-10-CM

## 2021-08-26 DIAGNOSIS — M48061 Spinal stenosis, lumbar region without neurogenic claudication: Secondary | ICD-10-CM | POA: Diagnosis present

## 2021-08-26 DIAGNOSIS — R4781 Slurred speech: Secondary | ICD-10-CM | POA: Diagnosis present

## 2021-08-26 DIAGNOSIS — D320 Benign neoplasm of cerebral meninges: Secondary | ICD-10-CM | POA: Diagnosis present

## 2021-08-26 DIAGNOSIS — Z6837 Body mass index (BMI) 37.0-37.9, adult: Secondary | ICD-10-CM | POA: Diagnosis not present

## 2021-08-26 DIAGNOSIS — R269 Unspecified abnormalities of gait and mobility: Secondary | ICD-10-CM | POA: Diagnosis present

## 2021-08-26 DIAGNOSIS — Z7901 Long term (current) use of anticoagulants: Secondary | ICD-10-CM | POA: Diagnosis not present

## 2021-08-26 DIAGNOSIS — R262 Difficulty in walking, not elsewhere classified: Secondary | ICD-10-CM | POA: Diagnosis not present

## 2021-08-26 DIAGNOSIS — I482 Chronic atrial fibrillation, unspecified: Secondary | ICD-10-CM | POA: Diagnosis present

## 2021-08-26 DIAGNOSIS — I4892 Unspecified atrial flutter: Secondary | ICD-10-CM | POA: Diagnosis present

## 2021-08-26 DIAGNOSIS — R059 Cough, unspecified: Secondary | ICD-10-CM | POA: Diagnosis present

## 2021-08-26 DIAGNOSIS — D638 Anemia in other chronic diseases classified elsewhere: Secondary | ICD-10-CM | POA: Diagnosis present

## 2021-08-26 DIAGNOSIS — L89623 Pressure ulcer of left heel, stage 3: Secondary | ICD-10-CM | POA: Diagnosis present

## 2021-08-26 DIAGNOSIS — Z8249 Family history of ischemic heart disease and other diseases of the circulatory system: Secondary | ICD-10-CM | POA: Diagnosis not present

## 2021-08-26 DIAGNOSIS — Z5309 Procedure and treatment not carried out because of other contraindication: Secondary | ICD-10-CM | POA: Diagnosis not present

## 2021-08-26 DIAGNOSIS — K219 Gastro-esophageal reflux disease without esophagitis: Secondary | ICD-10-CM | POA: Diagnosis present

## 2021-08-26 HISTORY — DX: Metabolic encephalopathy: G93.41

## 2021-08-26 HISTORY — DX: COVID-19: U07.1

## 2021-08-26 HISTORY — DX: Acute cystitis without hematuria: N30.00

## 2021-08-26 HISTORY — DX: Facial weakness: R29.810

## 2021-08-26 LAB — CBC WITH DIFFERENTIAL/PLATELET
Abs Immature Granulocytes: 0.04 10*3/uL (ref 0.00–0.07)
Basophils Absolute: 0 10*3/uL (ref 0.0–0.1)
Basophils Relative: 0 %
Eosinophils Absolute: 0 10*3/uL (ref 0.0–0.5)
Eosinophils Relative: 0 %
HCT: 32.8 % — ABNORMAL LOW (ref 36.0–46.0)
Hemoglobin: 10 g/dL — ABNORMAL LOW (ref 12.0–15.0)
Immature Granulocytes: 1 %
Lymphocytes Relative: 23 %
Lymphs Abs: 1.8 10*3/uL (ref 0.7–4.0)
MCH: 26.2 pg (ref 26.0–34.0)
MCHC: 30.5 g/dL (ref 30.0–36.0)
MCV: 85.9 fL (ref 80.0–100.0)
Monocytes Absolute: 0.7 10*3/uL (ref 0.1–1.0)
Monocytes Relative: 9 %
Neutro Abs: 5.3 10*3/uL (ref 1.7–7.7)
Neutrophils Relative %: 67 %
Platelets: 211 10*3/uL (ref 150–400)
RBC: 3.82 MIL/uL — ABNORMAL LOW (ref 3.87–5.11)
RDW: 16.8 % — ABNORMAL HIGH (ref 11.5–15.5)
WBC: 7.8 10*3/uL (ref 4.0–10.5)
nRBC: 0 % (ref 0.0–0.2)

## 2021-08-26 LAB — GLUCOSE, CAPILLARY: Glucose-Capillary: 129 mg/dL — ABNORMAL HIGH (ref 70–99)

## 2021-08-26 LAB — COMPREHENSIVE METABOLIC PANEL
ALT: 23 U/L (ref 0–44)
AST: 29 U/L (ref 15–41)
Albumin: 2.8 g/dL — ABNORMAL LOW (ref 3.5–5.0)
Alkaline Phosphatase: 71 U/L (ref 38–126)
Anion gap: 10 (ref 5–15)
BUN: 16 mg/dL (ref 8–23)
CO2: 21 mmol/L — ABNORMAL LOW (ref 22–32)
Calcium: 8.3 mg/dL — ABNORMAL LOW (ref 8.9–10.3)
Chloride: 104 mmol/L (ref 98–111)
Creatinine, Ser: 0.84 mg/dL (ref 0.44–1.00)
GFR, Estimated: >60 mL/min (ref >60)
Glucose, Bld: 104 mg/dL — ABNORMAL HIGH (ref 70–99)
Potassium: 4.4 mmol/L (ref 3.5–5.1)
Sodium: 135 mmol/L (ref 135–145)
Total Bilirubin: 0.4 mg/dL (ref 0.3–1.2)
Total Protein: 6.5 g/dL (ref 6.5–8.1)

## 2021-08-26 LAB — D-DIMER, QUANTITATIVE: D-Dimer, Quant: 1.38 ug/mL-FEU — ABNORMAL HIGH (ref 0.00–0.50)

## 2021-08-26 LAB — URINALYSIS, ROUTINE W REFLEX MICROSCOPIC
Bilirubin Urine: NEGATIVE
Glucose, UA: NEGATIVE mg/dL
Ketones, ur: NEGATIVE mg/dL
Nitrite: POSITIVE — AB
Protein, ur: 100 mg/dL — AB
Specific Gravity, Urine: 1.018 (ref 1.005–1.030)
WBC, UA: >50 WBC/hpf — ABNORMAL HIGH (ref 0–5)
pH: 5 (ref 5.0–8.0)

## 2021-08-26 LAB — RESP PANEL BY RT-PCR (FLU A&B, COVID) ARPGX2
Influenza A by PCR: NEGATIVE
Influenza B by PCR: NEGATIVE
SARS Coronavirus 2 by RT PCR: POSITIVE — AB

## 2021-08-26 LAB — CBG MONITORING, ED
Glucose-Capillary: 108 mg/dL — ABNORMAL HIGH (ref 70–99)
Glucose-Capillary: 95 mg/dL (ref 70–99)

## 2021-08-26 LAB — MAGNESIUM: Magnesium: 1.4 mg/dL — ABNORMAL LOW (ref 1.7–2.4)

## 2021-08-26 LAB — C-REACTIVE PROTEIN: CRP: 4.9 mg/dL — ABNORMAL HIGH (ref <1.0)

## 2021-08-26 MED ORDER — MENTHOL 3 MG MT LOZG
1.0000 | LOZENGE | OROMUCOSAL | Status: DC | PRN
  Administered 2021-08-26: 3 mg via ORAL
  Filled 2021-08-26 (×2): qty 9

## 2021-08-26 MED ORDER — SODIUM CHLORIDE 0.9 % IV SOLN
2.0000 g | Freq: Once | INTRAVENOUS | Status: AC
  Administered 2021-08-26: 2 g via INTRAVENOUS
  Filled 2021-08-26: qty 2

## 2021-08-26 MED ORDER — DOCUSATE SODIUM 100 MG PO CAPS
100.0000 mg | ORAL_CAPSULE | Freq: Every day | ORAL | Status: DC
  Administered 2021-08-27 – 2021-08-29 (×3): 100 mg via ORAL
  Filled 2021-08-26 (×3): qty 1

## 2021-08-26 MED ORDER — ZINC SULFATE 220 (50 ZN) MG PO CAPS
220.0000 mg | ORAL_CAPSULE | Freq: Every day | ORAL | Status: DC
  Administered 2021-08-26 – 2021-08-29 (×4): 220 mg via ORAL
  Filled 2021-08-26 (×4): qty 1

## 2021-08-26 MED ORDER — NIRMATRELVIR/RITONAVIR (PAXLOVID)TABLET: 3.0000 | ORAL_TABLET | Freq: Two times a day (BID) | ORAL | Status: DC

## 2021-08-26 MED ORDER — POLYETHYLENE GLYCOL 3350 17 G PO PACK: 17.0000 g | PACK | Freq: Every day | ORAL | Status: DC | PRN

## 2021-08-26 MED ORDER — SODIUM CHLORIDE 0.9 % IV SOLN
100.0000 mg | Freq: Every day | INTRAVENOUS | Status: AC
  Administered 2021-08-27 – 2021-08-28 (×2): 100 mg via INTRAVENOUS
  Filled 2021-08-26 (×2): qty 20

## 2021-08-26 MED ORDER — SODIUM CHLORIDE 0.9 % IV SOLN
200.0000 mg | Freq: Once | INTRAVENOUS | Status: AC
  Administered 2021-08-26: 200 mg via INTRAVENOUS
  Filled 2021-08-26: qty 40

## 2021-08-26 MED ORDER — SODIUM CHLORIDE 0.9 % IV BOLUS
500.0000 mL | Freq: Once | INTRAVENOUS | Status: AC
  Administered 2021-08-26: 500 mL via INTRAVENOUS

## 2021-08-26 MED ORDER — CARVEDILOL 6.25 MG PO TABS
6.2500 mg | ORAL_TABLET | Freq: Two times a day (BID) | ORAL | Status: DC
  Administered 2021-08-26 – 2021-08-29 (×7): 6.25 mg via ORAL
  Filled 2021-08-26: qty 2
  Filled 2021-08-26 (×6): qty 1

## 2021-08-26 MED ORDER — IPRATROPIUM-ALBUTEROL 0.5-2.5 (3) MG/3ML IN SOLN: 3.0000 mL | RESPIRATORY_TRACT | Status: DC | PRN

## 2021-08-26 MED ORDER — ACETAMINOPHEN 650 MG RE SUPP: 650.0000 mg | Freq: Four times a day (QID) | RECTAL | Status: DC | PRN

## 2021-08-26 MED ORDER — FLUTICASONE PROPIONATE 50 MCG/ACT NA SUSP
1.0000 | Freq: Every day | NASAL | Status: DC
  Administered 2021-08-28 – 2021-08-29 (×2): 1 via NASAL
  Filled 2021-08-26 (×2): qty 16

## 2021-08-26 MED ORDER — BENZONATATE 100 MG PO CAPS
100.0000 mg | ORAL_CAPSULE | Freq: Three times a day (TID) | ORAL | Status: DC | PRN
  Administered 2021-08-26 – 2021-08-28 (×3): 100 mg via ORAL
  Filled 2021-08-26 (×3): qty 1

## 2021-08-26 MED ORDER — ONDANSETRON HCL 4 MG/2ML IJ SOLN: 4.0000 mg | Freq: Four times a day (QID) | INTRAMUSCULAR | Status: DC | PRN

## 2021-08-26 MED ORDER — APIXABAN 5 MG PO TABS
5.0000 mg | ORAL_TABLET | Freq: Two times a day (BID) | ORAL | Status: DC
  Administered 2021-08-26 – 2021-08-29 (×7): 5 mg via ORAL
  Filled 2021-08-26 (×7): qty 1

## 2021-08-26 MED ORDER — MELATONIN 5 MG PO TABS
5.0000 mg | ORAL_TABLET | Freq: Every evening | ORAL | Status: DC | PRN
  Administered 2021-08-27: 5 mg via ORAL
  Filled 2021-08-26: qty 1

## 2021-08-26 MED ORDER — GUAIFENESIN ER 600 MG PO TB12: 600.0000 mg | ORAL_TABLET | Freq: Two times a day (BID) | ORAL | Status: DC

## 2021-08-26 MED ORDER — ONDANSETRON HCL 4 MG PO TABS: 4.0000 mg | ORAL_TABLET | Freq: Four times a day (QID) | ORAL | Status: DC | PRN

## 2021-08-26 MED ORDER — ACETAMINOPHEN 325 MG PO TABS
650.0000 mg | ORAL_TABLET | Freq: Four times a day (QID) | ORAL | Status: DC | PRN
  Administered 2021-08-26 – 2021-08-28 (×4): 650 mg via ORAL
  Filled 2021-08-26 (×4): qty 2

## 2021-08-26 MED ORDER — LORATADINE 10 MG PO TABS
10.0000 mg | ORAL_TABLET | Freq: Every day | ORAL | Status: DC
  Administered 2021-08-26 – 2021-08-29 (×4): 10 mg via ORAL
  Filled 2021-08-26 (×4): qty 1

## 2021-08-26 MED ORDER — SODIUM CHLORIDE 0.9 % IV SOLN
1.0000 g | INTRAVENOUS | Status: DC
  Administered 2021-08-26 – 2021-08-28 (×3): 1 g via INTRAVENOUS
  Filled 2021-08-26 (×3): qty 10

## 2021-08-26 MED ORDER — ATORVASTATIN CALCIUM 80 MG PO TABS
80.0000 mg | ORAL_TABLET | Freq: Every day | ORAL | Status: DC
  Administered 2021-08-26 – 2021-08-28 (×3): 80 mg via ORAL
  Filled 2021-08-26 (×3): qty 1

## 2021-08-26 MED ORDER — INSULIN ASPART 100 UNIT/ML IJ SOLN
0.0000 [IU] | Freq: Three times a day (TID) | INTRAMUSCULAR | Status: DC
  Administered 2021-08-26: 2 [IU] via SUBCUTANEOUS
  Administered 2021-08-27: 3 [IU] via SUBCUTANEOUS
  Administered 2021-08-28 (×3): 2 [IU] via SUBCUTANEOUS

## 2021-08-26 MED ORDER — ASCORBIC ACID 500 MG PO TABS
500.0000 mg | ORAL_TABLET | Freq: Every day | ORAL | Status: DC
  Administered 2021-08-26 – 2021-08-29 (×4): 500 mg via ORAL
  Filled 2021-08-26 (×4): qty 1

## 2021-08-26 MED ORDER — FERROUS GLUCONATE 324 (38 FE) MG PO TABS
324.0000 mg | ORAL_TABLET | Freq: Two times a day (BID) | ORAL | Status: DC
  Administered 2021-08-26 – 2021-08-29 (×7): 324 mg via ORAL
  Filled 2021-08-26 (×8): qty 1

## 2021-08-26 MED ORDER — LOSARTAN POTASSIUM 50 MG PO TABS
50.0000 mg | ORAL_TABLET | Freq: Every day | ORAL | Status: DC
  Administered 2021-08-26 – 2021-08-28 (×3): 50 mg via ORAL
  Filled 2021-08-26 (×3): qty 1

## 2021-08-26 MED ORDER — EZETIMIBE 10 MG PO TABS
10.0000 mg | ORAL_TABLET | Freq: Every day | ORAL | Status: DC
  Administered 2021-08-26 – 2021-08-28 (×3): 10 mg via ORAL
  Filled 2021-08-26 (×3): qty 1

## 2021-08-26 NOTE — Progress Notes (Signed)
PROGRESS NOTE    LANI [name]  TGG:[id number] DOB: May 05, 1941 DOA: 08/25/2021 PCP: Denita Lung, MD   Brief Narrative: 81 year old with past medical history significant for spinal stenosis and limited mobility, chronic A-flutter on Eliquis, hypertension, insulin-dependent diabetes, hyperlipidemia who presents to [facility] emergency department from [facility]  health rehab due to concerns over left facial droop, slurred speech and confusion. Per facility report they noticed left facial droop, facility did not know when the symptoms started.  Patient was recently treated for urinary tract infection.  Evaluation in the ED she was found to have a temperature of 102, tachycardic, UA suggestive of UTI.  COVID PCR positive.  CT head show low-density findings in the occipital lobe that the radiologist question was posterior reversible encephalopathy syndrome.  Case was discussed with neurologist on-call Dr. Leonel Ramsay who did not feel that patient fit that clinical picture.  MRI was recommended.   Assessment & Plan:   Principal Problem:   Acute metabolic encephalopathy Active Problems:   Hyperlipidemia associated with type 2 diabetes mellitus (HCC)   Gastroesophageal reflux disease without esophagitis   Atrial flutter (HCC)   Ambulatory dysfunction   Spinal stenosis of lumbar region at multiple levels   Acute cystitis without hematuria   COVID-19 virus infection   Facial droop   Pressure ulcer of left heel   1-Acute Metabolic Encephalopathy Patient presenting with a history of lethargy confusion. Multifactorial secondary to COVID infection as well as UTI.   2-COVID-19 infection: Third with fever, confusion. She had Covid vaccine.  Patient was started on Paxlovid but is contraindicated because patient takes Xarelto. Plan to do Remdesivir. Check inflammatory markers Chest x ray: no active diseases.   COVID-19 Labs  No results for input(s): DDIMER, FERRITIN, LDH, CRP in the last  72 hours.  Lab Results  Component Value Date   SARSCOV2NAA POSITIVE (A) 08/25/2021   [place] NEGATIVE 06/30/2021   [place] NEGATIVE 06/22/2021   [place] NEGATIVE 03/15/2021    3-Acute cystitis without hematuria: UA with more than 50 white blood cell Follow blood cultures Started on IV antibiotics (ceftriaxone)  4-Facial droop: Both facility as well as EMS noticed a left facial droop. Admitting physician and myself we do not appreciate significant left facial droop MRI negative for acute stroke  Ambulatory dysfunction: PT OT  Spinal Stenosis of lumbar region at multiple  Patient declined surgery intervention in the past.  A-flutter: Continue with Eliquis.   1 cm inferior right frontal meningioma; Will discussed with neurosurgery, but likely will just need follow up.   Pressure ulcer left heel:  X-ray: No acute fracture or dislocation, skin ulceration over the heel, diffuse soft tissue edema, hallux valgus.  Hyperlipidemia associated with type 2 diabetes:    Pressure Injury 06/28/21 Heel Left Deep Tissue Pressure Injury - Purple or maroon localized area of discolored intact skin or blood-filled blister due to damage of underlying soft tissue from pressure and/or shear. (Active)  06/28/21 0930  Location: Heel  Location Orientation: Left  Staging: Deep Tissue Pressure Injury - Purple or maroon localized area of discolored intact skin or blood-filled blister due to damage of underlying soft tissue from pressure and/or shear.  Wound Description (Comments):   Present on Admission: Yes      Estimated body mass index is 37.43 kg/m as calculated from the following:   Height as of 06/23/21: 5\' 4"  (1.626 m).   Weight as of 06/29/21: 98.9 kg.   DVT prophylaxis: Eliquis  Code Status: Full code Family  Communication: son updated over phone Disposition Plan:  Status is: Inpatient  Remains inpatient appropriate because: needs IV Remdesivir and IV antibiotics for  UTI<>         Consultants:  None  Procedures:  None  Antimicrobials:  Ceftriaxone  Subjective: She was alert, able to answer question, speech clear.   Objective: Vitals:   08/26/21 0305 08/26/21 0400 08/26/21 0415 08/26/21 0745  BP:  (!) 123/52 (!) 123/43 (!) 141/84  Pulse:  84 85 96  Resp:  20 20 20   Temp: 100.3 F (37.9 C)   100 F (37.8 C)  TempSrc: Oral   Oral  SpO2:  98% 97% 97%   No intake or output data in the 24 hours ending 08/26/21 0902 There were no vitals filed for this visit.  Examination:  General exam: Appears calm and comfortable  Respiratory system: Clear to auscultation. Respiratory effort normal. Cardiovascular system: S1 & S2 heard, RRR. No JVD, murmurs, rubs, gallops or clicks. No pedal edema. Gastrointestinal system: Abdomen is nondistended, soft and nontender. No organomegaly or masses felt. Normal bowel sounds heard. Central nervous system: Alert and oriented. Speech clear, follows command,  Extremities: Symmetric 5 x 5 power.  Data Reviewed: I have personally reviewed following labs and imaging studies  CBC: Recent Labs  Lab 08/25/21 2237  WBC 6.1  NEUTROABS 4.6  HGB 9.0*  HCT 28.8*  MCV 83.7  PLT 242   Basic Metabolic Panel: Recent Labs  Lab 08/25/21 2237  NA 136  K 4.2  CL 105  CO2 22  GLUCOSE 123*  BUN 17  CREATININE 0.88  CALCIUM 8.5*   GFR: CrCl cannot be calculated (Unknown ideal weight.). Liver Function Tests: Recent Labs  Lab 08/25/21 2237  AST 33  ALT 25  ALKPHOS 82  BILITOT 0.5  PROT 6.3*  ALBUMIN 2.8*   No results for input(s): LIPASE, AMYLASE in the last 168 hours. No results for input(s): AMMONIA in the last 168 hours. Coagulation Profile: Recent Labs  Lab 08/25/21 2237  INR 1.5*   Cardiac Enzymes: No results for input(s): CKTOTAL, CKMB, CKMBINDEX, TROPONINI in the last 168 hours. BNP (last 3 results) No results for input(s): PROBNP in the last 8760 hours. HbA1C: No results for  input(s): HGBA1C in the last 72 hours. CBG: No results for input(s): GLUCAP in the last 168 hours. Lipid Profile: No results for input(s): CHOL, HDL, LDLCALC, TRIG, CHOLHDL, LDLDIRECT in the last 72 hours. Thyroid Function Tests: No results for input(s): TSH, T4TOTAL, FREET4, T3FREE, THYROIDAB in the last 72 hours. Anemia Panel: No results for input(s): VITAMINB12, FOLATE, FERRITIN, TIBC, IRON, RETICCTPCT in the last 72 hours. Sepsis Labs: Recent Labs  Lab 08/25/21 2237  LATICACIDVEN 0.9    Recent Results (from the past 240 hour(s))  Blood Culture (routine x 2)     Status: None (Preliminary result)   Collection Time: 08/25/21 10:30 PM   Specimen: BLOOD  Result Value Ref Range Status   Specimen Description BLOOD RIGHT ANTECUBITAL  Final   Special Requests   Final    BOTTLES DRAWN AEROBIC AND ANAEROBIC Blood Culture results may not be optimal due to an inadequate volume of blood received in culture bottles   Culture   Final    NO GROWTH < 12 HOURS Performed at [facility], [place] [address] [place], [place] [address]    Report Status PENDING  Incomplete  Resp Panel by RT-PCR (Flu A&B, Covid) Nasopharyngeal Swab     Status: Abnormal  Collection Time: 08/25/21 10:33 PM   Specimen: Nasopharyngeal Swab; Nasopharyngeal(NP) swabs in vial transport medium  Result Value Ref Range Status   SARS Coronavirus 2 by RT PCR POSITIVE (A) NEGATIVE Final    Comment: (NOTE) SARS-CoV-2 target nucleic acids are DETECTED.  The SARS-CoV-2 RNA is generally detectable in upper respiratory specimens during the acute phase of infection. Positive results are indicative of the presence of the identified virus, but do not rule out bacterial infection or co-infection with other pathogens not detected by the test. Clinical correlation with patient history and other diagnostic information is necessary to determine patient infection status. The expected result is Negative.  Fact Sheet for  Patients: EntrepreneurPulse.com.au  Fact Sheet for Healthcare Providers: IncredibleEmployment.be  This test is not yet approved or cleared by the [place] FDA and  has been authorized for detection and/or diagnosis of SARS-CoV-2 by FDA under an Emergency Use Authorization (EUA).  This EUA will remain in effect (meaning this test can be used) for the duration of  the COVID-19 declaration under Section 564(b)(1) of the A ct, 21 U.S.C. section 360bbb-3(b)(1), unless the authorization is terminated or revoked sooner.     Influenza A by PCR NEGATIVE NEGATIVE Final   Influenza B by PCR NEGATIVE NEGATIVE Final    Comment: (NOTE) The Xpert Xpress SARS-CoV-2/FLU/RSV plus assay is intended as an aid in the diagnosis of influenza from Nasopharyngeal swab specimens and should not be used as a sole basis for treatment. Nasal washings and aspirates are unacceptable for Xpert Xpress SARS-CoV-2/FLU/RSV testing.  Fact Sheet for Patients: EntrepreneurPulse.com.au  Fact Sheet for Healthcare Providers: IncredibleEmployment.be  This test is not yet approved or cleared by the [place] FDA and has been authorized for detection and/or diagnosis of SARS-CoV-2 by FDA under an Emergency Use Authorization (EUA). This EUA will remain in effect (meaning this test can be used) for the duration of the COVID-19 declaration under Section 564(b)(1) of the Act, 21 U.S.C. section 360bbb-3(b)(1), unless the authorization is terminated or revoked.  Performed at [facility], [place] [address], [place], [place] [address]   Blood Culture (routine x 2)     Status: None (Preliminary result)   Collection Time: 08/25/21 10:36 PM   Specimen: BLOOD RIGHT HAND  Result Value Ref Range Status   Specimen Description BLOOD RIGHT HAND  Final   Special Requests   Final    BOTTLES DRAWN AEROBIC AND ANAEROBIC Blood Culture results may not be  optimal due to an inadequate volume of blood received in culture bottles   Culture   Final    NO GROWTH < 12 HOURS Performed at [facility], [place] [address], [place], [place] [address]    Report Status PENDING  Incomplete         Radiology Studies: CT HEAD WO CONTRAST (5MM)  Result Date: 08/25/2021 CLINICAL DATA:  Mental status change. EXAM: CT HEAD WITHOUT CONTRAST TECHNIQUE: Contiguous axial images were obtained from the base of the skull through the vertex without intravenous contrast. RADIATION DOSE REDUCTION: This exam was performed according to the departmental dose-optimization program which includes automated exposure control, adjustment of the mA and/or kV according to patient size and/or use of iterative reconstruction technique. COMPARISON:  Remote head CT 02/01/2011 FINDINGS: Brain: No acute hemorrhage. Normal for age atrophy. There is prominent periventricular and deep white matter hypodensity typical of chronic small vessel ischemia. There is also mild subcortical low-density within both posterior occipital lobes. No evidence of acute infarct.  Right greater than left basal gangliar mineralization, often senescent. No subdural or extra-axial collection. No midline shift or evident pulmonary mass. Vascular: No hyperdense vessel. Skull: Osseous density arising from the inner table of the inferior right frontal bone is stable from 20 2012 exam and considered benign. No skull fracture. Sinuses/Orbits: Occasional mucosal thickening of ethmoid air cells. The mastoid air cells are clear. No acute orbital findings. Other: None. IMPRESSION: Normal for age atrophy with prominent periventricular chronic small vessel ischemia. There is also symmetric low-density in the subcortical occipital lobes, likely representing chronic ischemic change, however the possibility of posterior reversible encephalopathy syndrome is also considered. As clinically indicated, recommend further assessment with MRI.  Electronically Signed   By: Keith Rake M.D.   On: 08/25/2021 23:04   MR Brain Wo Contrast (neuro protocol)  Result Date: 08/26/2021 CLINICAL DATA:  Neuro deficit with acute stroke suspected EXAM: MRI HEAD WITHOUT CONTRAST TECHNIQUE: Multiplanar, multiecho pulse sequences of the brain and surrounding structures were obtained without intravenous contrast. COMPARISON:  Head CT from yesterday.  Brain MRI 02/04/2011 FINDINGS: Brain: No acute infarction, hemorrhage, hydrocephalus, extra-axial collection or mass effect. FLAIR hyperintensity in the cerebral white matter attributed to extensive chronic small vessel ischemia. Cortical thinning symmetrically at the bilateral perirolandic regions, also suggested on prior. Right inferior frontal meningioma measuring 1 cm. Vascular: Major flow voids are preserved Skull and upper cervical spine: Generalized cervical degenerative facet spurring with mild anterolisthesis at C3-4. Normal marrow signal. Sinuses/Orbits: Bilateral ethmoid mucosal thickening. IMPRESSION: 1. No acute finding. 2. Confluent chronic small vessel ischemia in the hemispheric white matter that is progressed from 20/12. 3. 1 cm inferior right frontal meningioma. Electronically Signed   By: Jorje Guild M.D.   On: 08/26/2021 07:49   DG Chest Port 1 View  Result Date: 08/25/2021 CLINICAL DATA:  Concern for sepsis. EXAM: PORTABLE CHEST 1 VIEW COMPARISON:  Chest radiograph dated 03/05/2021. FINDINGS: No focal consolidation, pleural effusion, or pneumothorax. The cardiac silhouette is within normal limits. Atherosclerotic calcification of the aorta. No acute osseous pathology. Degenerative changes of the spine. IMPRESSION: No active cardiopulmonary disease. Electronically Signed   By: Anner Crete M.D.   On: 08/25/2021 23:35   DG Foot Complete Left  Result Date: 08/25/2021 CLINICAL DATA:  Skin wound over the heel. EXAM: LEFT FOOT - COMPLETE 3+ VIEW COMPARISON:  Left ankle radiograph dated  03/10/2021. FINDINGS: No acute fracture or dislocation. Mild osteopenia. There is hallux valgus. There is degenerative changes of the ankle and metatarsal joints with spurring. There is ulceration of the skin over the healed. No radiopaque foreign object. There is diffuse soft tissue edema. IMPRESSION: 1. No acute fracture or dislocation. 2. Skin ulceration over the heel. 3. Diffuse soft tissue edema. 4. Hallux valgus. Electronically Signed   By: Anner Crete M.D.   On: 08/25/2021 23:36        Scheduled Meds:  apixaban  5 mg Oral BID   vitamin C  500 mg Oral Daily   atorvastatin  80 mg Oral QHS   carvedilol  6.25 mg Oral BID   docusate sodium  100 mg Oral Daily   ezetimibe  10 mg Oral QHS   ferrous gluconate  324 mg Oral BID   fluticasone  1 spray Each Nare Daily   insulin aspart  0-15 Units Subcutaneous TID AC & HS   loratadine  10 mg Oral Daily   losartan  50 mg Oral QHS   nirmatrelvir/ritonavir EUA  3 tablet Oral BID  zinc sulfate  220 mg Oral Daily   Continuous Infusions:  cefTRIAXone (ROCEPHIN)  IV       LOS: 0 days    Time spent: 35 minutes.     Elmarie Shiley, MD [facility]   If 7PM-7AM, please contact night-coverage [email or web address]  08/26/2021, 9:02 AM

## 2021-08-26 NOTE — Assessment & Plan Note (Signed)
·   Both facility staff as well as EMS noted a left facial droop although upon my examination I am having difficulty appreciating this finding  Patient proceeding with MRI brain without contrast although I think the likelihood of an acute stroke is low  Serial neurologic checks  We will expand work-up further if MRI does indeed identify an acute stroke.

## 2021-08-26 NOTE — Assessment & Plan Note (Addendum)
·   Patient presenting with complaints of cough lethargy and confusion  Patient exhibiting fever on arrival although I am uncertain as to whether this is due to [place] or urinary tract infection  Considering patient is at high risk of worsening COVID infection will initiate course of Paxlovid  As needed antitussives for cough  As needed bronchodilator therapy for shortness of breath and wheezing  Zinc and vitamin C supplementation  Airborne and contact precautions  Obtaining D-dimer, CRP

## 2021-08-26 NOTE — Assessment & Plan Note (Signed)
·   Significant ambulatory dysfunction due to spinal stenosis  Patient was treated with a course of steroids in early December  Neurosurgery was consulted at the time however patient repeatedly declined surgical intervention  PT evaluation ordered  Patient will need to go back to skilled nursing facility at time of discharge

## 2021-08-26 NOTE — H&P (Signed)
History and Physical    Kayla [name] UYQ:[id number] DOB: April 01, 1941 DOA: 08/25/2021  PCP: Denita Lung, MD  Patient coming from: [facility] via EMS   Chief Complaint:  Chief Complaint  Patient presents with   Stroke Like Symptoms     L sided facial droop, left sided weakness, slurred speech, AMS      HPI:    81 year old female with past medical history of spinal stenosis and limited mobility, chronic atrial flutter on Eliquis, hypertension, insulin-dependent diabetes mellitus, hyperlipidemia who presents to [facility] emergency department from [place] and rehab due to concerns over left facial droop slurred speech and confusion.  Patient is a somewhat poor historian therefore in addition to speaking to the patient history has been obtained from the emergency department staff, triage documentation as well as reviewing EMS notes.  Patient's facility contacted EMS due to identifying a left facial droop.  According to EMS, staff at the patient's facility was uncertain as to when the patient's symptoms of confusion and droop actually had begun.   EMS promptly arrived and noted that the patient was somewhat confused with left facial droop.  Of note, patient was recently treated from January 3 through 9 for a urinary tract infection at her facility with a course of Keflex.  Urine culture results are not available for review.  Upon arrival to [facility] emergency department patient was noted to have a fever of 102.3 F as well as being tachycardic.  Urinalysis was performed and was found to be suggestive of a urinary tract infection.  Patient was therefore initiated on intravenous aztreonam.  COVID-19 PCR testing was also found to be positive.  Noncontrast CT imaging of the head was performed and did not find anything acute with exception of some low-density findings in the occipital lobes that the radiologist questioned was posterior reversible  encephalopathy syndrome.  ER provider discussed case with Dr. Leonel Ramsay with neurology who, based on phone conversation, did not feel that patient fit that clinical picture but agreed that a MRI would be appropriate for further evaluation.  The hospitalist group was then called to assess the patient for admission to the hospital.  Review of Systems:   Review of Systems  Unable to perform ROS: Mental status change   Past Medical History:  Diagnosis Date   Arthritis    Atrial fibrillation, chronic (HCC)    Diabetes mellitus    Hypertension    Obesity    Pulmonary embolus ([place])     Past Surgical History:  Procedure Laterality Date   ABDOMINAL HYSTERECTOMY     CHOLECYSTECTOMY     JOINT REPLACEMENT  Right     reports that she has never smoked. She has never used smokeless tobacco. She reports that she does not drink alcohol and does not use drugs.  No Known Allergies  Family History  Problem Relation Age of Onset   Hypertension Mother      Prior to Admission medications   Medication Sig Start Date End Date Taking? Authorizing Provider  acetaminophen (TYLENOL) 325 MG tablet Take 650 mg by mouth every 8 (eight) hours as needed for mild pain or moderate pain.   Yes [provider]  acetaminophen (TYLENOL) 500 MG tablet Take 500 mg by mouth 2 (two) times daily.   Yes [provider]  apixaban (ELIQUIS) 5 MG TABS tablet Take 1 tablet (5 mg total) by mouth 2 (two) times daily. 05/23/21 08/26/21 Yes Jill Alexanders  C, MD  atorvastatin (LIPITOR) 80 MG tablet Take 1 tablet (80 mg total) by mouth daily. Patient taking differently: Take 80 mg by mouth at bedtime. 01/20/20  Yes Denita Lung, MD  carvedilol (COREG) 6.25 MG tablet Take 1 tablet (6.25 mg total) by mouth 2 (two) times daily. 03/31/21  Yes Chandrasekhar, Mahesh A, MD  collagenase (SANTYL) ointment Apply 1 application topically daily.   Yes [provider]  diclofenac Sodium (VOLTAREN) 1 % GEL Apply 2 g  topically 4 (four) times daily. Rub into affected area of foot 2 to 4 times daily Patient taking differently: Apply 2 g topically 4 (four) times daily as needed (pain). 03/31/21  Yes Tysinger, Camelia Eng, PA-C  docusate sodium (COLACE) 100 MG capsule Take 100 mg by mouth daily.   Yes [provider]  ezetimibe (ZETIA) 10 MG tablet Take 1 tablet (10 mg total) by mouth daily. Patient taking differently: Take 10 mg by mouth at bedtime. 05/23/21 08/26/21 Yes Denita Lung, MD  ferrous gluconate (FERGON) 324 MG tablet Take 324 mg by mouth 2 (two) times daily.   Yes [provider]  fluticasone (FLONASE) 50 MCG/ACT nasal spray Place 1 spray into both nostrils daily.   Yes [provider]  gabapentin (NEURONTIN) 100 MG capsule Take 2 capsules (200 mg total) by mouth 3 (three) times daily. Patient taking differently: Take 200 mg by mouth 2 (two) times daily. 06/30/21  Yes Charlynne Cousins, MD  guaiFENesin (MUCINEX) 600 MG 12 hr tablet Take 600 mg by mouth 2 (two) times daily.   Yes [provider]  lidocaine 4 % dressing Apply 2 patches topically daily.   Yes [provider]  loratadine (CLARITIN) 10 MG tablet Take 10 mg by mouth daily.   Yes [provider]  losartan (COZAAR) 50 MG tablet Take 1 tablet (50 mg total) by mouth daily. Patient taking differently: Take 50 mg by mouth at bedtime. 05/23/21 08/26/21 Yes Denita Lung, MD  melatonin 5 MG TABS Take 5 mg by mouth at bedtime as needed (sleep).   Yes [provider]  metFORMIN (GLUCOPHAGE) 500 MG tablet Take 1 tablet (500 mg total) by mouth 2 (two) times daily with a meal. 12/28/20  Yes Denita Lung, MD  TRIAMCINOLONE ACETONIDE, TOP, 0.05 % OINT Apply 1 application topically 2 (two) times daily as needed (rough skin on heels). 03/31/21  Yes Tysinger, Camelia Eng, PA-C  Accu-Chek Softclix Lancets lancets 1 each by Other route daily. Use as instructed 03/30/20   Denita Lung, MD  acetaminophen  (TYLENOL) 650 MG CR tablet Take 650 mg by mouth every 8 (eight) hours as needed for pain. Patient not taking: Reported on 08/26/2021    [provider]  ammonium lactate (LAC-HYDRIN) 12 % lotion Apply 1 application topically as needed for dry skin. Patient not taking: Reported on 08/26/2021 03/31/21   Tysinger, Camelia Eng, PA-C  carbamide peroxide (DEBROX) 6.5 % OTIC solution Place 1-2 drops into both ears daily as needed (wax build up). Patient not taking: Reported on 08/26/2021    [provider]  furosemide (LASIX) 20 MG tablet TAKE 1 TABLET (20 MG TOTAL) BY MOUTH DAILY AS NEEDED (TAKE AS NEEDED FOR SWELLING). Patient not taking: Reported on 08/26/2021 07/13/21   Rudean Haskell A, MD  glucose blood test strip 1 each by Other route as needed for other. Use as instructed Patient taking differently: 1 each by Other route daily. Use as instructed 12/31/20  Denita Lung, MD  insulin detemir (LEVEMIR) 100 UNIT/ML injection Inject 0.08 mLs (8 Units total) into the skin daily for 10 days. Continue while on steroids. 07/01/21 07/11/21  Charlynne Cousins, MD  nitroGLYCERIN (NITROSTAT) 0.4 MG SL tablet Take up to 3 tablets Patient taking differently: 0.4 mg every 5 (five) minutes as needed for chest pain. 03/31/21   Chandrasekhar, Terisa Starr, MD  predniSONE (DELTASONE) 10 MG tablet Takes 6 tablets for 1 days, then 5 tablets for 1 days, then 4 tablets for 1 days, then 3 tablets for 1 days, then 2 tabs for 1 days, then 1 tab for 1 days, and then stop. Patient not taking: Reported on 08/26/2021 06/30/21   Charlynne Cousins, MD    Physical Exam: Vitals:   08/26/21 0305 08/26/21 0400 08/26/21 0415 08/26/21 0745  BP:  (!) 123/52 (!) 123/43 (!) 141/84  Pulse:  84 85 96  Resp:  20 20 20   Temp: 100.3 F (37.9 C)   100 F (37.8 C)  TempSrc: Oral   Oral  SpO2:  98% 97% 97%   Constitutional: Lethargic but arousable, oriented x2, not in any associated distress.  Poor skin: no rashes, no  lesions, poor skin turgor noted. Eyes: Pupils are equally reactive to light.  No evidence of scleral icterus or conjunctival pallor.  ENMT: Dry mucous membranes noted.  Posterior pharynx clear of any exudate or lesions.   Neck: normal, supple, no masses, no thyromegaly.  No evidence of jugular venous distension.   Respiratory: clear to auscultation bilaterally, no wheezing, no crackles. Normal respiratory effort. No accessory muscle use.  Cardiovascular: Regular rate and rhythm, no murmurs / rubs / gallops. No extremity edema. 2+ pedal pulses. No carotid bruits.  Chest:   Nontender without crepitus or deformity.   Back:   Nontender without crepitus or deformity. Abdomen: Abdomen is soft and nontender.  No evidence of intra-abdominal masses.  Positive bowel sounds noted in all quadrants.   Musculoskeletal: No joint deformity upper and lower extremities. Good ROM, no contractures. Normal muscle tone.  Neurologic: CN 2-12 grossly intact. Sensation intact.  Patient moving all 4 extremities spontaneously.  Patient is following all commands.  Patient is responsive to verbal stimuli.   Psychiatric: Patient exhibits flat affect.  Patient currently does not seem to possess insight as to their current situation.   Labs on Admission: I have personally reviewed following labs and imaging studies -   CBC: Recent Labs  Lab 08/25/21 2237  WBC 6.1  NEUTROABS 4.6  HGB 9.0*  HCT 28.8*  MCV 83.7  PLT 814   Basic Metabolic Panel: Recent Labs  Lab 08/25/21 2237  NA 136  K 4.2  CL 105  CO2 22  GLUCOSE 123*  BUN 17  CREATININE 0.88  CALCIUM 8.5*   GFR: CrCl cannot be calculated (Unknown ideal weight.). Liver Function Tests: Recent Labs  Lab 08/25/21 2237  AST 33  ALT 25  ALKPHOS 82  BILITOT 0.5  PROT 6.3*  ALBUMIN 2.8*   No results for input(s): LIPASE, AMYLASE in the last 168 hours. No results for input(s): AMMONIA in the last 168 hours. Coagulation Profile: Recent Labs  Lab  08/25/21 2237  INR 1.5*   Cardiac Enzymes: No results for input(s): CKTOTAL, CKMB, CKMBINDEX, TROPONINI in the last 168 hours. BNP (last 3 results) No results for input(s): PROBNP in the last 8760 hours. HbA1C: No results for input(s): HGBA1C in the last 72 hours. CBG: No results for input(s): GLUCAP  in the last 168 hours. Lipid Profile: No results for input(s): CHOL, HDL, LDLCALC, TRIG, CHOLHDL, LDLDIRECT in the last 72 hours. Thyroid Function Tests: No results for input(s): TSH, T4TOTAL, FREET4, T3FREE, THYROIDAB in the last 72 hours. Anemia Panel: No results for input(s): VITAMINB12, FOLATE, FERRITIN, TIBC, IRON, RETICCTPCT in the last 72 hours. Urine analysis:    Component Value Date/Time   COLORURINE YELLOW 08/26/2021 0245   APPEARANCEUR TURBID (A) 08/26/2021 0245   LABSPEC 1.018 08/26/2021 0245   PHURINE 5.0 08/26/2021 0245   GLUCOSEU NEGATIVE 08/26/2021 0245   HGBUR MODERATE (A) 08/26/2021 0245   BILIRUBINUR NEGATIVE 08/26/2021 0245   BILIRUBINUR n 11/27/2016 1129   KETONESUR NEGATIVE 08/26/2021 0245   PROTEINUR 100 (A) 08/26/2021 0245   UROBILINOGEN negative (A) 11/27/2016 1129   UROBILINOGEN 0.2 12/01/2010 2352   NITRITE POSITIVE (A) 08/26/2021 0245   LEUKOCYTESUR LARGE (A) 08/26/2021 0245    Radiological Exams on Admission - Personally Reviewed: CT HEAD WO CONTRAST (5MM)  Result Date: 08/25/2021 CLINICAL DATA:  Mental status change. EXAM: CT HEAD WITHOUT CONTRAST TECHNIQUE: Contiguous axial images were obtained from the base of the skull through the vertex without intravenous contrast. RADIATION DOSE REDUCTION: This exam was performed according to the departmental dose-optimization program which includes automated exposure control, adjustment of the mA and/or kV according to patient size and/or use of iterative reconstruction technique. COMPARISON:  Remote head CT 02/01/2011 FINDINGS: Brain: No acute hemorrhage. Normal for age atrophy. There is prominent  periventricular and deep white matter hypodensity typical of chronic small vessel ischemia. There is also mild subcortical low-density within both posterior occipital lobes. No evidence of acute infarct. Right greater than left basal gangliar mineralization, often senescent. No subdural or extra-axial collection. No midline shift or evident pulmonary mass. Vascular: No hyperdense vessel. Skull: Osseous density arising from the inner table of the inferior right frontal bone is stable from 20 2012 exam and considered benign. No skull fracture. Sinuses/Orbits: Occasional mucosal thickening of ethmoid air cells. The mastoid air cells are clear. No acute orbital findings. Other: None. IMPRESSION: Normal for age atrophy with prominent periventricular chronic small vessel ischemia. There is also symmetric low-density in the subcortical occipital lobes, likely representing chronic ischemic change, however the possibility of posterior reversible encephalopathy syndrome is also considered. As clinically indicated, recommend further assessment with MRI. Electronically Signed   By: Keith Rake M.D.   On: 08/25/2021 23:04   MR Brain Wo Contrast (neuro protocol)  Result Date: 08/26/2021 CLINICAL DATA:  Neuro deficit with acute stroke suspected EXAM: MRI HEAD WITHOUT CONTRAST TECHNIQUE: Multiplanar, multiecho pulse sequences of the brain and surrounding structures were obtained without intravenous contrast. COMPARISON:  Head CT from yesterday.  Brain MRI 02/04/2011 FINDINGS: Brain: No acute infarction, hemorrhage, hydrocephalus, extra-axial collection or mass effect. FLAIR hyperintensity in the cerebral white matter attributed to extensive chronic small vessel ischemia. Cortical thinning symmetrically at the bilateral perirolandic regions, also suggested on prior. Right inferior frontal meningioma measuring 1 cm. Vascular: Major flow voids are preserved Skull and upper cervical spine: Generalized cervical degenerative  facet spurring with mild anterolisthesis at C3-4. Normal marrow signal. Sinuses/Orbits: Bilateral ethmoid mucosal thickening. IMPRESSION: 1. No acute finding. 2. Confluent chronic small vessel ischemia in the hemispheric white matter that is progressed from 20/12. 3. 1 cm inferior right frontal meningioma. Electronically Signed   By: Jorje Guild M.D.   On: 08/26/2021 07:49   DG Chest Port 1 View  Result Date: 08/25/2021 CLINICAL DATA:  Concern for sepsis.  EXAM: PORTABLE CHEST 1 VIEW COMPARISON:  Chest radiograph dated 03/05/2021. FINDINGS: No focal consolidation, pleural effusion, or pneumothorax. The cardiac silhouette is within normal limits. Atherosclerotic calcification of the aorta. No acute osseous pathology. Degenerative changes of the spine. IMPRESSION: No active cardiopulmonary disease. Electronically Signed   By: Anner Crete M.D.   On: 08/25/2021 23:35   DG Foot Complete Left  Result Date: 08/25/2021 CLINICAL DATA:  Skin wound over the heel. EXAM: LEFT FOOT - COMPLETE 3+ VIEW COMPARISON:  Left ankle radiograph dated 03/10/2021. FINDINGS: No acute fracture or dislocation. Mild osteopenia. There is hallux valgus. There is degenerative changes of the ankle and metatarsal joints with spurring. There is ulceration of the skin over the healed. No radiopaque foreign object. There is diffuse soft tissue edema. IMPRESSION: 1. No acute fracture or dislocation. 2. Skin ulceration over the heel. 3. Diffuse soft tissue edema. 4. Hallux valgus. Electronically Signed   By: Anner Crete M.D.   On: 08/25/2021 23:36    EKG: Personally reviewed.  Rhythm is normal sinus rhythm with heart rate of 95 bpm.  No dynamic ST segment changes appreciated.  Assessment/Plan  * Acute metabolic encephalopathy- (present on admission) Patient presenting with at least a 1 day history of lethargy and confusion, based on my assessment there is quite mild. Etiology may be multifactorial secondary to COVID-19  infection as well as recurrent urinary tract infection Treating underlying urinary tract infection with antibiotics, creating underlying COVID-19 infection with Paxlovid Encephalopathy should spontaneously resolve however if it does not we will expand work-up   COVID-19 virus infection- (present on admission) Patient presenting with complaints of cough lethargy and confusion Patient exhibiting fever on arrival although I am uncertain as to whether this is due to [place] or urinary tract infection Considering patient is at high risk of worsening COVID infection will initiate course of Paxlovid As needed antitussives for cough As needed bronchodilator therapy for shortness of breath and wheezing Zinc and vitamin C supplementation Airborne and contact precautions Obtaining D-dimer, CRP  Acute cystitis without hematuria- (present on admission) Patient was treated for urinary tract infection in early January with Keflex at her facility however we do not have culture results available Urinalysis here continues to be suggestive of urinary tract infection Considering patient's fever and encephalopathy, will proceed with treating with intravenous ceftriaxone for now, de-escalate once cultures become available Blood cultures additionally obtained Abdominal exam is completely unremarkable, I do not believe that abdominal imaging is necessary  Facial droop- (present on admission) Both facility staff as well as EMS noted a left facial droop although upon my examination I am having difficulty appreciating this finding Patient proceeding with MRI brain without contrast although I think the likelihood of an acute stroke is low Serial neurologic checks We will expand work-up further if MRI does indeed identify an acute stroke.  Ambulatory dysfunction- (present on admission) Significant ambulatory dysfunction due to spinal stenosis Patient was treated with a course of steroids in early  December Neurosurgery was consulted at the time however patient repeatedly declined surgical intervention PT evaluation ordered Patient will need to go back to skilled nursing facility at time of discharge  Spinal stenosis of lumbar region at multiple levels- (present on admission) Please see assessment and plan above  Atrial flutter ([place])- (present on admission) Currently rate controlled and in normal sinus rhythm Continue home regimen of anticoagulation Continue home regimen of rate controlling agent Monitoring on telemetry   Pressure ulcer of left heel- (present on admission)  Present on admission Regular dressing changes Wound care consultation placed, recommendations are appreciated  Hyperlipidemia associated with type 2 diabetes mellitus ([place])- (present on admission) Continuing home regimen of lipid lowering therapy.       Code Status:  Full code  code status   Status is: Inpatient  Remains inpatient appropriate because: Acute metabolic encephalopathy secondary to COVID-19 infection and urinary tract infection requiring intravenous antibiotic therapy, treatment of underlying COVID-19, serial neurologic checks, extensive work-up and close clinical monitoring        Vernelle Emerald MD [facility] Pager [phone]  If 7PM-7AM, please contact night-coverage [email or web address] Use universal [place] password for that web site. If you do not have the password, please call the hospital operator.  08/26/2021, 9:06 AM

## 2021-08-26 NOTE — ED Notes (Signed)
BP cuff readjusted. Blood pressure still soft.

## 2021-08-26 NOTE — ED Notes (Signed)
Unsuccessful in & out x1 - MD notified

## 2021-08-26 NOTE — Assessment & Plan Note (Signed)
·   Please see assessment and plan above °

## 2021-08-26 NOTE — ED Notes (Signed)
Pt to MRI

## 2021-08-26 NOTE — Assessment & Plan Note (Signed)
.   Continuing home regimen of lipid lowering therapy.  

## 2021-08-26 NOTE — Assessment & Plan Note (Signed)
·   Patient was treated for urinary tract infection in early January with Keflex at her facility however we do not have culture results available  Urinalysis here continues to be suggestive of urinary tract infection  Considering patient's fever and encephalopathy, will proceed with treating with intravenous ceftriaxone for now, de-escalate once cultures become available  Blood cultures additionally obtained  Abdominal exam is completely unremarkable, I do not believe that abdominal imaging is necessary

## 2021-08-26 NOTE — Consult Note (Signed)
WOC Nurse Consult Note: Reason for Consult:Left heel chronic, nonhealing pressure injury. Stage 3 Wound type:Pressure Pressure Injury POA: Yes Measurement:3cm x 3.5cm x 0.3cm Wound bed: irregular with areas of depth and elevation. Red, moist. 95% red. 5% white Drainage (amount, consistency, odor) small amount light yellow Periwound: intact with evidence of previous wound healing (scarring) Dressing procedure/placement/frequency: Cleanse with NS, pat dry. Cover with folded piece of xeroform gauze Kellie Simmering # [phone]). Top with dry gauze and secure with a few turns of Kerlix roll gauze/paper tape. Place foot into Prevalon boot.  [facility] nursing team will not follow, but will remain available to this patient, the nursing and medical teams.  Please re-consult if needed. Thanks, Maudie Flakes, MSN, RN, [place], Arther Abbott  Pager# [phone]

## 2021-08-26 NOTE — ED Notes (Signed)
Pt returned from MRI, incontinent of large amount soft brown stool.  No visible blood.  Temp remains 100.0, of note upon return from MRI  total of 8 blankets in place.  Pt has wound dressing to left heel in place prior to admission, yellow drainage visible, area cleansed and dry sterile dressing applied, protective foam cup re-applied to area.

## 2021-08-26 NOTE — Assessment & Plan Note (Signed)
·   Patient presenting with at least a 1 day history of lethargy and confusion, based on my assessment there is quite mild.  Etiology may be multifactorial secondary to COVID-19 infection as well as recurrent urinary tract infection  Treating underlying urinary tract infection with antibiotics, creating underlying COVID-19 infection with Paxlovid  Encephalopathy should spontaneously resolve however if it does not we will expand work-up

## 2021-08-26 NOTE — ED Notes (Signed)
Updated son on patient plan and current status.

## 2021-08-26 NOTE — Assessment & Plan Note (Signed)
·   Present on admission  Regular dressing changes  Wound care consultation placed, recommendations are appreciated

## 2021-08-26 NOTE — Assessment & Plan Note (Signed)
   Currently rate controlled and in normal sinus rhythm  Continue home regimen of anticoagulation  Continue home regimen of rate controlling agent  Monitoring on telemetry  

## 2021-08-27 DIAGNOSIS — G9341 Metabolic encephalopathy: Secondary | ICD-10-CM | POA: Diagnosis not present

## 2021-08-27 LAB — HEMOGLOBIN A1C
Hgb A1c MFr Bld: 6.4 % — ABNORMAL HIGH (ref 4.8–5.6)
Mean Plasma Glucose: 136.98 mg/dL

## 2021-08-27 LAB — CBC WITH DIFFERENTIAL/PLATELET
Abs Immature Granulocytes: 0.03 10*3/uL (ref 0.00–0.07)
Basophils Absolute: 0 10*3/uL (ref 0.0–0.1)
Basophils Relative: 0 %
Eosinophils Absolute: 0.1 10*3/uL (ref 0.0–0.5)
Eosinophils Relative: 1 %
HCT: 30.7 % — ABNORMAL LOW (ref 36.0–46.0)
Hemoglobin: 9.7 g/dL — ABNORMAL LOW (ref 12.0–15.0)
Immature Granulocytes: 0 %
Lymphocytes Relative: 22 %
Lymphs Abs: 1.6 10*3/uL (ref 0.7–4.0)
MCH: 26.1 pg (ref 26.0–34.0)
MCHC: 31.6 g/dL (ref 30.0–36.0)
MCV: 82.5 fL (ref 80.0–100.0)
Monocytes Absolute: 0.7 10*3/uL (ref 0.1–1.0)
Monocytes Relative: 10 %
Neutro Abs: 4.8 10*3/uL (ref 1.7–7.7)
Neutrophils Relative %: 67 %
Platelets: 177 10*3/uL (ref 150–400)
RBC: 3.72 MIL/uL — ABNORMAL LOW (ref 3.87–5.11)
RDW: 16.4 % — ABNORMAL HIGH (ref 11.5–15.5)
WBC: 7.3 10*3/uL (ref 4.0–10.5)
nRBC: 0 % (ref 0.0–0.2)

## 2021-08-27 LAB — GLUCOSE, CAPILLARY
Glucose-Capillary: 117 mg/dL — ABNORMAL HIGH (ref 70–99)
Glucose-Capillary: 169 mg/dL — ABNORMAL HIGH (ref 70–99)
Glucose-Capillary: 104 mg/dL — ABNORMAL HIGH (ref 70–99)
Glucose-Capillary: 108 mg/dL — ABNORMAL HIGH (ref 70–99)

## 2021-08-27 LAB — COMPREHENSIVE METABOLIC PANEL
ALT: 22 U/L (ref 0–44)
AST: 29 U/L (ref 15–41)
Albumin: 2.6 g/dL — ABNORMAL LOW (ref 3.5–5.0)
Alkaline Phosphatase: 79 U/L (ref 38–126)
Anion gap: 11 (ref 5–15)
BUN: 17 mg/dL (ref 8–23)
CO2: 21 mmol/L — ABNORMAL LOW (ref 22–32)
Calcium: 8.3 mg/dL — ABNORMAL LOW (ref 8.9–10.3)
Chloride: 103 mmol/L (ref 98–111)
Creatinine, Ser: 0.9 mg/dL (ref 0.44–1.00)
GFR, Estimated: >60 mL/min (ref >60)
Glucose, Bld: 141 mg/dL — ABNORMAL HIGH (ref 70–99)
Potassium: 3.9 mmol/L (ref 3.5–5.1)
Sodium: 135 mmol/L (ref 135–145)
Total Bilirubin: 0.5 mg/dL (ref 0.3–1.2)
Total Protein: 6.2 g/dL — ABNORMAL LOW (ref 6.5–8.1)

## 2021-08-27 LAB — MAGNESIUM: Magnesium: 1.4 mg/dL — ABNORMAL LOW (ref 1.7–2.4)

## 2021-08-27 LAB — C-REACTIVE PROTEIN: CRP: 7.2 mg/dL — ABNORMAL HIGH (ref <1.0)

## 2021-08-27 MED ORDER — GUAIFENESIN ER 600 MG PO TB12
600.0000 mg | ORAL_TABLET | Freq: Two times a day (BID) | ORAL | Status: DC
  Administered 2021-08-27 – 2021-08-29 (×5): 600 mg via ORAL
  Filled 2021-08-27 (×5): qty 1

## 2021-08-27 MED ORDER — MAGNESIUM SULFATE 2 GM/50ML IV SOLN
2.0000 g | Freq: Once | INTRAVENOUS | Status: AC
  Administered 2021-08-27: 2 g via INTRAVENOUS
  Filled 2021-08-27: qty 50

## 2021-08-27 NOTE — Plan of Care (Signed)
°  Problem: Education: Goal: Knowledge of General Education information will improve Description: Including pain rating scale, medication(s)/side effects and non-pharmacologic comfort measures Outcome: Progressing   Problem: Clinical Measurements: Goal: Ability to maintain clinical measurements within normal limits will improve Outcome: Progressing Goal: Will remain free from infection Outcome: Progressing Goal: Diagnostic test results will improve Outcome: Progressing Goal: Respiratory complications will improve Outcome: Progressing   Problem: Activity: Goal: Risk for activity intolerance will decrease Outcome: Progressing   Problem: Nutrition: Goal: Adequate nutrition will be maintained Outcome: Progressing

## 2021-08-27 NOTE — Evaluation (Signed)
Physical Therapy Evaluation Patient Details Name: Kayla [name] MRN: [id number] DOB: [birth year] Today's Date: 08/27/2021  History of Present Illness  pt is an 81 y/o female admitted 1/26 with facial droop/ s/s of stroke,  MRI shows no acute abnormality.  Found to be COVID+  PMHx:  afib, DM HTN, PE  Clinical Impression  Pt admitted with/for s/s stroke, but no acute abnormality found on MRI.  COVID +.  Pt's mobility in general limited by L Knee ROM and overall strength bil LE's.  Pt was mod assist supine to/from EOB.  .  Pt currently limited functionally due to the problems listed. ( See problems list.)   Pt will benefit from PT to maximize function and safety in order to get ready for next venue listed below.        Recommendations for follow up therapy are one component of a multi-disciplinary discharge planning process, led by the attending physician.  Recommendations may be updated based on patient status, additional functional criteria and insurance authorization.  Follow Up Recommendations Skilled nursing-short term rehab (<3 hours/day)    Assistance Recommended at Discharge Frequent or constant Supervision/Assistance  Patient can return home with the following  A lot of help with walking and/or transfers;A lot of help with bathing/dressing/bathroom;Assistance with cooking/housework;Direct supervision/assist for medications management;Direct supervision/assist for financial management;Assist for transportation    Equipment Recommendations    Recommendations for Other Services       Functional Status Assessment Patient has had a recent decline in their functional status and demonstrates the ability to make significant improvements in function in a reasonable and predictable amount of time.     Precautions / Restrictions Precautions Precautions: Fall      Mobility  Bed Mobility Overal bed mobility: Needs Assistance Bed Mobility: Supine to Sit, Sit to Supine     Supine to  sit: Mod assist, HOB elevated Sit to supine: Mod assist, Max assist   General bed mobility comments: pt attempted assist with UE's, but not very effective without assist with bed pad.  pt unable to position for an effective bridge.    Transfers                   General transfer comment: pt not willing to try stand at EOB and probably not safe without +2 assist    Ambulation/Gait               General Gait Details: not able  Stairs            Wheelchair Mobility    Modified Rankin (Stroke Patients Only)       Balance Overall balance assessment: Needs assistance Sitting-balance support: No upper extremity supported, Feet unsupported, Feet supported Sitting balance-Leahy Scale: Fair Sitting balance - Comments: sat EOB x10 min eating her soup after LE exercise and knee flexion ROM exercise                                     Pertinent Vitals/Pain Pain Assessment Pain Assessment: Faces Faces Pain Scale: Hurts a little bit Pain Descriptors / Indicators: Discomfort, Grimacing Pain Intervention(s): Limited activity within patient's tolerance, Monitored during session    Home Living Family/patient expects to be discharged to:: Skilled nursing facility                   Additional Comments: Pt has PCA 2x/wk for 4 hrs who assists with  household tasks    Prior Function Prior Level of Function : Needs assist             Mobility Comments: up until pt couldnt stand, was walking with RW by herself in the home       Hand Dominance   Dominant Hand: Right    Extremity/Trunk Assessment   Upper Extremity Assessment Upper Extremity Assessment: Overall WFL for tasks assessed    Lower Extremity Assessment Lower Extremity Assessment: Generalized weakness;RLE deficits/detail;LLE deficits/detail RLE Deficits / Details: stiff at knee, but funtional and gross strength 3+/5 RLE Coordination: decreased fine motor LLE Deficits /  Details: contractured at knee with 1-3deg ext lag and ~ 30 deg flexion.  Generalized weakness in the limited range. LLE Coordination: decreased fine motor;decreased gross motor    Cervical / Trunk Assessment Cervical / Trunk Assessment: Kyphotic  Communication   Communication: No difficulties  Cognition Arousal/Alertness: Awake/alert Behavior During Therapy: WFL for tasks assessed/performed Overall Cognitive Status: No family/caregiver present to determine baseline cognitive functioning                                 General Comments: follows simple commands, tangential, poor to fair historian        General Comments      Exercises General Exercises - Lower Extremity Long Arc Quad: AROM, Both, 10 reps, Seated (limited L ROM) Other Exercises Other Exercises: knee/hip flex/ext AAROM x10 reps, in supine Other Exercises: AA knee flexion L LE in sitting to 50 degs firm endrange   Assessment/Plan    PT Assessment Patient needs continued PT services  PT Problem List Decreased strength;Decreased activity tolerance;Decreased mobility;Decreased coordination;Decreased knowledge of use of DME;Pain;Decreased range of motion;Decreased balance       PT Treatment Interventions Functional mobility training;Therapeutic activities;Therapeutic exercise;Balance training;Neuromuscular re-education;Patient/family education    PT Goals (Current goals can be found in the Care Plan section)  Acute Rehab PT Goals Patient Stated Goal: I want to walk again PT Goal Formulation: With patient Time For Goal Achievement: 09/09/21 Potential to Achieve Goals: Fair    Frequency Min 2X/week     Co-evaluation               AM-PAC PT "6 Clicks" Mobility  Outcome Measure Help needed turning from your back to your side while in a flat bed without using bedrails?: A Lot Help needed moving from lying on your back to sitting on the side of a flat bed without using bedrails?: A Lot Help  needed moving to and from a bed to a chair (including a wheelchair)?: A Lot Help needed standing up from a chair using your arms (e.g., wheelchair or bedside chair)?: Total Help needed to walk in hospital room?: Total Help needed climbing 3-5 steps with a railing? : Total 6 Click Score: 9    End of Session   Activity Tolerance: Patient tolerated treatment well;Other (comment) (limited mobility) Patient left: in bed;with call bell/phone within reach Nurse Communication: Mobility status PT Visit Diagnosis: Other abnormalities of gait and mobility (R26.89);Muscle weakness (generalized) (M62.81);Pain Pain - Right/Left: Left Pain - part of body:  (heel)    Time: 7322-0254 PT Time Calculation (min) (ACUTE ONLY): 47 min   Charges:   PT Evaluation $PT Eval Moderate Complexity: 1 Mod PT Treatments $Therapeutic Exercise: 8-22 mins $Therapeutic Activity: 8-22 mins        08/27/2021  Ginger Carne., PT Acute Rehabilitation Services [phone]  [phone]  pager) [phone]  (office)  Kayla [name] Kayla [name] 08/27/2021, 12:22 PM

## 2021-08-27 NOTE — Progress Notes (Signed)
PROGRESS NOTE    Kayla [name]  ZHG:[id number] DOB: Aug 29, 1940 DOA: 08/25/2021 PCP: Denita Lung, MD   Brief Narrative: 81 year old with past medical history significant for spinal stenosis and limited mobility, chronic A-flutter on Eliquis, hypertension, insulin-dependent diabetes, hyperlipidemia who presents to [facility] emergency department from [facility]  health rehab due to concerns over left facial droop, slurred speech and confusion. Per facility report they noticed left facial droop, facility did not know when the symptoms started.  Patient was recently treated for urinary tract infection.  Evaluation in the ED she was found to have a temperature of 102, tachycardic, UA suggestive of UTI.  COVID PCR positive.  CT head show low-density findings in the occipital lobe that the radiologist question was posterior reversible encephalopathy syndrome.  Case was discussed with neurologist on-call Dr. Leonel Ramsay who did not feel that patient fit that clinical picture.  MRI was recommended.   Assessment & Plan:   Principal Problem:   Acute metabolic encephalopathy Active Problems:   Hyperlipidemia associated with type 2 diabetes mellitus (HCC)   Atrial flutter (HCC)   Ambulatory dysfunction   Spinal stenosis of lumbar region at multiple levels   Acute cystitis without hematuria   COVID-19 virus infection   Facial droop   Pressure ulcer of left heel   1-Acute Metabolic Encephalopathy Patient presenting with a history of lethargy confusion. Multifactorial secondary to COVID infection as well as UTI. Resolved.   2-COVID-19 infection: Third with fever, confusion. She had Covid vaccine.  Patient was started on Paxlovid but is contraindicated because patient takes Xarelto. Continue with Remdesivir. Follow inflammatory markers.  Chest x ray: no active diseases.  Report mild cough. No hypoxemia.   COVID-19 Labs  Recent Labs    08/26/21 1352 08/27/21 0203  DDIMER 1.38*  --    CRP 4.9* 7.2*    Lab Results  Component Value Date   SARSCOV2NAA POSITIVE (A) 08/25/2021   SARSCOV2NAA NEGATIVE 06/30/2021   SARSCOV2NAA NEGATIVE 06/22/2021   [place] NEGATIVE 03/15/2021    3-Acute cystitis without hematuria: UA with more than 50 white blood cell Blood cultures: No growth to date.  Urine culture: 100,000 colonies E coli.  Started on IV antibiotics (ceftriaxone)  4-Facial droop: Both facility as well as EMS noticed a left facial droop. Admitting physician and myself we do not appreciate significant left facial droop MRI negative for acute stroke Resolved.   Ambulatory dysfunction: PT OT  Spinal Stenosis of lumbar region at multiple  Patient declined surgery intervention in the past.  A-flutter: Continue with Eliquis.   1 cm inferior right frontal meningioma; Neurosurgery recommend follow MRI in one year with PCP.   Pressure ulcer left heel:  X-ray: No acute fracture or dislocation, skin ulceration over the heel, diffuse soft tissue edema, hallux valgus. Continue with wound care.   Hyperlipidemia associated with type 2 diabetes: SSI  Hypomagnesemia; replete IV> mg times 2.   See wound documentation bellow.    Pressure Injury 06/28/21 Heel Left Deep Tissue Pressure Injury - Purple or maroon localized area of discolored intact skin or blood-filled blister due to damage of underlying soft tissue from pressure and/or shear. (Active)  06/28/21 0930  Location: Heel  Location Orientation: Left  Staging: Deep Tissue Pressure Injury - Purple or maroon localized area of discolored intact skin or blood-filled blister due to damage of underlying soft tissue from pressure and/or shear.  Wound Description (Comments):   Present on Admission: Yes      Estimated body mass  index is 37.12 kg/m as calculated from the following:   Height as of this encounter: 5\' 4"  (1.626 m).   Weight as of this encounter: 98.1 kg.   DVT prophylaxis: Eliquis  Code  Status: Full code Family Communication: son updated over phone 1/28 Disposition Plan:  Status is: Inpatient  Remains inpatient appropriate because: needs IV Remdesivir and IV antibiotics for UTI<>         Consultants:  None  Procedures:  None  Antimicrobials:  Ceftriaxone  Subjective: She is alert, conversant, speech is clear.  She report mild cough.  Denies dyspnea.  Objective: Vitals:   08/26/21 2012 08/27/21 0107 08/27/21 0522 08/27/21 0907  BP: (!) 149/61 (!) 177/91 139/66 140/67  Pulse: 88 86 76 87  Resp: 18 18 18 18   Temp: 98.9 F (37.2 C) 97.6 F (36.4 C) 98.1 F (36.7 C) 98 F (36.7 C)  TempSrc: Oral Oral Oral   SpO2: 97% 90% 99% 96%  Weight:      Height:        Intake/Output Summary (Last 24 hours) at 08/27/2021 0948 Last data filed at 08/27/2021 8182 Gross per 24 hour  Intake 60 ml  Output 200 ml  Net -140 ml   Filed Weights   08/26/21 1813  Weight: 98.1 kg    Examination:  General exam: NAD  Respiratory system: CTA Cardiovascular system: S 1, S 2 RRR Gastrointestinal system: BS present, soft, nt Central nervous system: Alert, oriented,  follows command Extremities: Symmetric power  Data Reviewed: I have personally reviewed following labs and imaging studies  CBC: Recent Labs  Lab 08/25/21 2237 08/26/21 1352 08/27/21 0203  WBC 6.1 7.8 7.3  NEUTROABS 4.6 5.3 4.8  HGB 9.0* 10.0* 9.7*  HCT 28.8* 32.8* 30.7*  MCV 83.7 85.9 82.5  PLT 238 211 993    Basic Metabolic Panel: Recent Labs  Lab 08/25/21 2237 08/26/21 1352 08/27/21 0203  NA 136 135 135  K 4.2 4.4 3.9  CL 105 104 103  CO2 22 21* 21*  GLUCOSE 123* 104* 141*  BUN 17 16 17   CREATININE 0.88 0.84 0.90  CALCIUM 8.5* 8.3* 8.3*  MG  --  1.4* 1.4*    GFR: Estimated Creatinine Clearance: 53.9 mL/min (by C-G formula based on SCr of 0.9 mg/dL). Liver Function Tests: Recent Labs  Lab 08/25/21 2237 08/26/21 1352 08/27/21 0203  AST 33 29 29  ALT 25 23 22   ALKPHOS  82 71 79  BILITOT 0.5 0.4 0.5  PROT 6.3* 6.5 6.2*  ALBUMIN 2.8* 2.8* 2.6*    No results for input(s): LIPASE, AMYLASE in the last 168 hours. No results for input(s): AMMONIA in the last 168 hours. Coagulation Profile: Recent Labs  Lab 08/25/21 2237  INR 1.5*    Cardiac Enzymes: No results for input(s): CKTOTAL, CKMB, CKMBINDEX, TROPONINI in the last 168 hours. BNP (last 3 results) No results for input(s): PROBNP in the last 8760 hours. HbA1C: Recent Labs    08/27/21 0203  HGBA1C 6.4*   CBG: Recent Labs  Lab 08/26/21 1242 08/26/21 1648 08/26/21 2102 08/27/21 0635  GLUCAP 108* 95 129* 117*   Lipid Profile: No results for input(s): CHOL, HDL, LDLCALC, TRIG, CHOLHDL, LDLDIRECT in the last 72 hours. Thyroid Function Tests: No results for input(s): TSH, T4TOTAL, FREET4, T3FREE, THYROIDAB in the last 72 hours. Anemia Panel: No results for input(s): VITAMINB12, FOLATE, FERRITIN, TIBC, IRON, RETICCTPCT in the last 72 hours. Sepsis Labs: Recent Labs  Lab 08/25/21 2237  LATICACIDVEN 0.9  Recent Results (from the past 240 hour(s))  Blood Culture (routine x 2)     Status: None (Preliminary result)   Collection Time: 08/25/21 10:30 PM   Specimen: BLOOD  Result Value Ref Range Status   Specimen Description BLOOD RIGHT ANTECUBITAL  Final   Special Requests   Final    BOTTLES DRAWN AEROBIC AND ANAEROBIC Blood Culture results may not be optimal due to an inadequate volume of blood received in culture bottles   Culture   Final    NO GROWTH 2 DAYS Performed at [facility] [address] [place], [place] [address]    Report Status PENDING  Incomplete  Resp Panel by RT-PCR (Flu A&B, Covid) Nasopharyngeal Swab     Status: Abnormal   Collection Time: 08/25/21 10:33 PM   Specimen: Nasopharyngeal Swab; Nasopharyngeal(NP) swabs in vial transport medium  Result Value Ref Range Status   SARS Coronavirus 2 by RT PCR POSITIVE (A) NEGATIVE Final    Comment:  (NOTE) SARS-CoV-2 target nucleic acids are DETECTED.  The SARS-CoV-2 RNA is generally detectable in upper respiratory specimens during the acute phase of infection. Positive results are indicative of the presence of the identified virus, but do not rule out bacterial infection or co-infection with other pathogens not detected by the test. Clinical correlation with patient history and other diagnostic information is necessary to determine patient infection status. The expected result is Negative.  Fact Sheet for Patients: EntrepreneurPulse.com.au  Fact Sheet for Healthcare Providers: IncredibleEmployment.be  This test is not yet approved or cleared by the [place] FDA and  has been authorized for detection and/or diagnosis of SARS-CoV-2 by FDA under an Emergency Use Authorization (EUA).  This EUA will remain in effect (meaning this test can be used) for the duration of  the COVID-19 declaration under Section 564(b)(1) of the A ct, 21 U.S.C. section 360bbb-3(b)(1), unless the authorization is terminated or revoked sooner.     Influenza A by PCR NEGATIVE NEGATIVE Final   Influenza B by PCR NEGATIVE NEGATIVE Final    Comment: (NOTE) The Xpert Xpress SARS-CoV-2/FLU/RSV plus assay is intended as an aid in the diagnosis of influenza from Nasopharyngeal swab specimens and should not be used as a sole basis for treatment. Nasal washings and aspirates are unacceptable for Xpert Xpress SARS-CoV-2/FLU/RSV testing.  Fact Sheet for Patients: EntrepreneurPulse.com.au  Fact Sheet for Healthcare Providers: IncredibleEmployment.be  This test is not yet approved or cleared by the [place] FDA and has been authorized for detection and/or diagnosis of SARS-CoV-2 by FDA under an Emergency Use Authorization (EUA). This EUA will remain in effect (meaning this test can be used) for the duration of the COVID-19  declaration under Section 564(b)(1) of the Act, 21 U.S.C. section 360bbb-3(b)(1), unless the authorization is terminated or revoked.  Performed at [facility], [place] [address], [place], [place] [address]   Blood Culture (routine x 2)     Status: None (Preliminary result)   Collection Time: 08/25/21 10:36 PM   Specimen: BLOOD RIGHT HAND  Result Value Ref Range Status   Specimen Description BLOOD RIGHT HAND  Final   Special Requests   Final    BOTTLES DRAWN AEROBIC AND ANAEROBIC Blood Culture results may not be optimal due to an inadequate volume of blood received in culture bottles   Culture   Final    NO GROWTH 2 DAYS Performed at [facility], [place] [address] [place], [place] [address]    Report Status  PENDING  Incomplete  Urine Culture     Status: Abnormal (Preliminary result)   Collection Time: 08/25/21 11:09 PM   Specimen: In/Out Cath Urine  Result Value Ref Range Status   Specimen Description IN/OUT CATH URINE  Final   Special Requests NONE  Final   Culture (A)  Final    >=100,000 COLONIES/mL GRAM NEGATIVE RODS SUSCEPTIBILITIES TO FOLLOW Performed at [facility], [address] [place],  [address]    Report Status PENDING  Incomplete          Radiology Studies: CT HEAD WO CONTRAST (5MM)  Result Date: 08/25/2021 CLINICAL DATA:  Mental status change. EXAM: CT HEAD WITHOUT CONTRAST TECHNIQUE: Contiguous axial images were obtained from the base of the skull through the vertex without intravenous contrast. RADIATION DOSE REDUCTION: This exam was performed according to the departmental dose-optimization program which includes automated exposure control, adjustment of the mA and/or kV according to patient size and/or use of iterative reconstruction technique. COMPARISON:  Remote head CT 02/01/2011 FINDINGS: Brain: No acute hemorrhage. Normal for age atrophy. There is prominent periventricular and deep white matter hypodensity typical of chronic small  vessel ischemia. There is also mild subcortical low-density within both posterior occipital lobes. No evidence of acute infarct. Right greater than left basal gangliar mineralization, often senescent. No subdural or extra-axial collection. No midline shift or evident pulmonary mass. Vascular: No hyperdense vessel. Skull: Osseous density arising from the inner table of the inferior right frontal bone is stable from 20 2012 exam and considered benign. No skull fracture. Sinuses/Orbits: Occasional mucosal thickening of ethmoid air cells. The mastoid air cells are clear. No acute orbital findings. Other: None. IMPRESSION: Normal for age atrophy with prominent periventricular chronic small vessel ischemia. There is also symmetric low-density in the subcortical occipital lobes, likely representing chronic ischemic change, however the possibility of posterior reversible encephalopathy syndrome is also considered. As clinically indicated, recommend further assessment with MRI. Electronically Signed   By: Keith Rake M.D.   On: 08/25/2021 23:04   MR Brain Wo Contrast (neuro protocol)  Result Date: 08/26/2021 CLINICAL DATA:  Neuro deficit with acute stroke suspected EXAM: MRI HEAD WITHOUT CONTRAST TECHNIQUE: Multiplanar, multiecho pulse sequences of the brain and surrounding structures were obtained without intravenous contrast. COMPARISON:  Head CT from yesterday.  Brain MRI 02/04/2011 FINDINGS: Brain: No acute infarction, hemorrhage, hydrocephalus, extra-axial collection or mass effect. FLAIR hyperintensity in the cerebral white matter attributed to extensive chronic small vessel ischemia. Cortical thinning symmetrically at the bilateral perirolandic regions, also suggested on prior. Right inferior frontal meningioma measuring 1 cm. Vascular: Major flow voids are preserved Skull and upper cervical spine: Generalized cervical degenerative facet spurring with mild anterolisthesis at C3-4. Normal marrow signal.  Sinuses/Orbits: Bilateral ethmoid mucosal thickening. IMPRESSION: 1. No acute finding. 2. Confluent chronic small vessel ischemia in the hemispheric white matter that is progressed from 20/12. 3. 1 cm inferior right frontal meningioma. Electronically Signed   By: Jorje Guild M.D.   On: 08/26/2021 07:49   DG Chest Port 1 View  Result Date: 08/25/2021 CLINICAL DATA:  Concern for sepsis. EXAM: PORTABLE CHEST 1 VIEW COMPARISON:  Chest radiograph dated 03/05/2021. FINDINGS: No focal consolidation, pleural effusion, or pneumothorax. The cardiac silhouette is within normal limits. Atherosclerotic calcification of the aorta. No acute osseous pathology. Degenerative changes of the spine. IMPRESSION: No active cardiopulmonary disease. Electronically Signed   By: Anner Crete M.D.   On: 08/25/2021 23:35   DG Foot Complete Left  Result Date: 08/25/2021 CLINICAL DATA:  Skin wound over the heel. EXAM: LEFT FOOT - COMPLETE 3+ VIEW COMPARISON:  Left ankle radiograph dated 03/10/2021. FINDINGS: No acute fracture or dislocation. Mild osteopenia. There is hallux valgus. There is degenerative changes of the ankle and metatarsal joints with spurring. There is ulceration of the skin over the healed. No radiopaque foreign object. There is diffuse soft tissue edema. IMPRESSION: 1. No acute fracture or dislocation. 2. Skin ulceration over the heel. 3. Diffuse soft tissue edema. 4. Hallux valgus. Electronically Signed   By: Anner Crete M.D.   On: 08/25/2021 23:36        Scheduled Meds:  apixaban  5 mg Oral BID   vitamin C  500 mg Oral Daily   atorvastatin  80 mg Oral QHS   carvedilol  6.25 mg Oral BID   docusate sodium  100 mg Oral Daily   ezetimibe  10 mg Oral QHS   ferrous gluconate  324 mg Oral BID   fluticasone  1 spray Each Nare Daily   guaiFENesin  600 mg Oral BID   insulin aspart  0-15 Units Subcutaneous TID AC & HS   loratadine  10 mg Oral Daily   losartan  50 mg Oral QHS   zinc sulfate  220 mg  Oral Daily   Continuous Infusions:  cefTRIAXone (ROCEPHIN)  IV Stopped (08/26/21 1000)   magnesium sulfate bolus IVPB     magnesium sulfate bolus IVPB     remdesivir 100 mg in NS 100 mL       LOS: 1 day    Time spent: 35 minutes.     Elmarie Shiley, MD [facility]   If 7PM-7AM, please contact night-coverage [email or web address]  08/27/2021, 9:48 AM

## 2021-08-28 DIAGNOSIS — G9341 Metabolic encephalopathy: Secondary | ICD-10-CM | POA: Diagnosis not present

## 2021-08-28 LAB — BASIC METABOLIC PANEL
Anion gap: 5 (ref 5–15)
BUN: 15 mg/dL (ref 8–23)
CO2: 23 mmol/L (ref 22–32)
Calcium: 7.8 mg/dL — ABNORMAL LOW (ref 8.9–10.3)
Chloride: 106 mmol/L (ref 98–111)
Creatinine, Ser: 0.76 mg/dL (ref 0.44–1.00)
GFR, Estimated: >60 mL/min (ref >60)
Glucose, Bld: 117 mg/dL — ABNORMAL HIGH (ref 70–99)
Potassium: 4.3 mmol/L (ref 3.5–5.1)
Sodium: 134 mmol/L — ABNORMAL LOW (ref 135–145)

## 2021-08-28 LAB — URINE CULTURE: Culture: >=100000 — AB

## 2021-08-28 LAB — RETICULOCYTES
Immature Retic Fract: 12.6 % (ref 2.3–15.9)
RBC.: 3.47 MIL/uL — ABNORMAL LOW (ref 3.87–5.11)
Retic Count, Absolute: 37.5 10*3/uL (ref 19.0–186.0)
Retic Ct Pct: 1.1 % (ref 0.4–3.1)

## 2021-08-28 LAB — C-REACTIVE PROTEIN: CRP: 4.5 mg/dL — ABNORMAL HIGH (ref <1.0)

## 2021-08-28 LAB — CBC
HCT: 28.2 % — ABNORMAL LOW (ref 36.0–46.0)
Hemoglobin: 8.8 g/dL — ABNORMAL LOW (ref 12.0–15.0)
MCH: 25.7 pg — ABNORMAL LOW (ref 26.0–34.0)
MCHC: 31.2 g/dL (ref 30.0–36.0)
MCV: 82.2 fL (ref 80.0–100.0)
Platelets: 228 10*3/uL (ref 150–400)
RBC: 3.43 MIL/uL — ABNORMAL LOW (ref 3.87–5.11)
RDW: 16.5 % — ABNORMAL HIGH (ref 11.5–15.5)
WBC: 5.2 10*3/uL (ref 4.0–10.5)
nRBC: 0 % (ref 0.0–0.2)

## 2021-08-28 LAB — GLUCOSE, CAPILLARY
Glucose-Capillary: 99 mg/dL (ref 70–99)
Glucose-Capillary: 145 mg/dL — ABNORMAL HIGH (ref 70–99)
Glucose-Capillary: 130 mg/dL — ABNORMAL HIGH (ref 70–99)
Glucose-Capillary: 132 mg/dL — ABNORMAL HIGH (ref 70–99)

## 2021-08-28 LAB — FOLATE: Folate: 12.1 ng/mL (ref >5.9)

## 2021-08-28 LAB — IRON AND TIBC
Iron: 43 ug/dL (ref 28–170)
Saturation Ratios: 18 % (ref 10.4–31.8)
TIBC: 235 ug/dL — ABNORMAL LOW (ref 250–450)
UIBC: 192 ug/dL

## 2021-08-28 LAB — VITAMIN B12: Vitamin B-12: 402 pg/mL (ref 180–914)

## 2021-08-28 LAB — FERRITIN: Ferritin: 52 ng/mL (ref 11–307)

## 2021-08-28 LAB — MAGNESIUM: Magnesium: 2.1 mg/dL (ref 1.7–2.4)

## 2021-08-28 NOTE — Progress Notes (Signed)
PROGRESS NOTE    CAIDYNCE MUZYKA  BTD:[id number] DOB: 13-Aug-1940 DOA: 08/25/2021 PCP: Denita Lung, MD   Brief Narrative: 81 year old with past medical history significant for spinal stenosis and limited mobility, chronic A-flutter on Eliquis, hypertension, insulin-dependent diabetes, hyperlipidemia who presents to [facility] emergency department from [facility]  health rehab due to concerns over left facial droop, slurred speech and confusion. Per facility report they noticed left facial droop, facility did not know when the symptoms started.  Patient was recently treated for urinary tract infection.  Evaluation in the ED she was found to have a temperature of 102, tachycardic, UA suggestive of UTI.  COVID PCR positive.  CT head show low-density findings in the occipital lobe that the radiologist question was posterior reversible encephalopathy syndrome.  Case was discussed with neurologist on-call Dr. Leonel Ramsay who did not feel that patient fit that clinical picture.  MRI was recommended.   Assessment & Plan:   Principal Problem:   Acute metabolic encephalopathy Active Problems:   Hyperlipidemia associated with type 2 diabetes mellitus (HCC)   Atrial flutter (HCC)   Ambulatory dysfunction   Spinal stenosis of lumbar region at multiple levels   Acute cystitis without hematuria   COVID-19 virus infection   Facial droop   Pressure ulcer of left heel   1-Acute Metabolic Encephalopathy Patient presenting with a history of lethargy confusion. Multifactorial secondary to COVID infection as well as UTI. Resolved.   2-COVID-19 infection: Third with fever, confusion. She had Covid vaccine.  Patient was started on Paxlovid but is contraindicated because patient takes Xarelto. Completed 3 days of Remdesivir 1/29 Follow inflammatory markers. Trending down.  Chest x ray: no active diseases.  Report mild cough. No hypoxemia.   [place]    08/26/21 1352  08/27/21 0203 08/28/21 0418  DDIMER 1.38*  --   --   FERRITIN  --   --  52  CRP 4.9* 7.2* 4.5*     Lab Results  Component Value Date   SARSCOV2NAA POSITIVE (A) 08/25/2021   SARSCOV2NAA NEGATIVE 06/30/2021   SARSCOV2NAA NEGATIVE 06/22/2021   [place] NEGATIVE 03/15/2021    3-Acute cystitis without hematuria: UA with more than 50 white blood cell Blood cultures: No growth to date.  Urine culture: 100,000 colonies E coli. Resistant to Bactrim, ampicillin.  Started on IV antibiotics (ceftriaxone), continue.   4-Facial droop: Both facility as well as EMS noticed a left facial droop. Admitting physician and myself we do not appreciate significant left facial droop MRI negative for acute stroke Resolved.   Ambulatory dysfunction: PT OT  Spinal Stenosis of lumbar region at multiple  Patient declined surgery intervention in the past.  A-flutter: Continue with Eliquis.   1 cm inferior right frontal meningioma; Neurosurgery recommend follow MRI in one year with PCP.   Pressure ulcer left heel:  X-ray: No acute fracture or dislocation, skin ulceration over the heel, diffuse soft tissue edema, hallux valgus. Continue with wound care.   Hyperlipidemia associated with type 2 diabetes: SSI  Hypomagnesemia; resolved.  Anemia of chronic diseases.  See wound documentation bellow.    Pressure Injury 06/28/21 Heel Left Deep Tissue Pressure Injury - Purple or maroon localized area of discolored intact skin or blood-filled blister due to damage of underlying soft tissue from pressure and/or shear. (Active)  06/28/21 0930  Location: Heel  Location Orientation: Left  Staging: Deep Tissue Pressure Injury - Purple or maroon localized area of discolored intact skin or blood-filled blister due to  damage of underlying soft tissue from pressure and/or shear.  Wound Description (Comments):   Present on Admission: Yes      Estimated body mass index is 37.12 kg/m as calculated from  the following:   Height as of this encounter: 5\' 4"  (1.626 m).   Weight as of this encounter: 98.1 kg.   DVT prophylaxis: Eliquis  Code Status: Full code Family Communication: son updated over phone 1/28 Disposition Plan:  Status is: Inpatient  Remains inpatient appropriate because: needs IV Remdesivir and IV antibiotics for UTI<>         Consultants:  None  Procedures:  None  Antimicrobials:  Ceftriaxone  Subjective: She is feeling well, alert and conversant. Report cough improving.   Objective: Vitals:   08/27/21 0907 08/27/21 1633 08/27/21 2241 08/28/21 0940  BP: 140/67 138/71 (!) 170/78 (!) 150/61  Pulse: 87 90 78 66  Resp: 18 18  18   Temp: 98 F (36.7 C) 98 F (36.7 C) 98.1 F (36.7 C) 98 F (36.7 C)  TempSrc:  Oral Oral Oral  SpO2: 96% 98% 97% 99%  Weight:      Height:        Intake/Output Summary (Last 24 hours) at 08/28/2021 1417 Last data filed at 08/28/2021 1208 Gross per 24 hour  Intake 1139.27 ml  Output 1100 ml  Net 39.27 ml    Filed Weights   08/26/21 1813  Weight: 98.1 kg    Examination:  General exam: NAD Respiratory system: CTA Cardiovascular system: S 1,. S 2 RRR Gastrointestinal system: BBS present, Soft, nt Central nervous system: Alert, follows command Extremities: No edema  Data Reviewed: I have personally reviewed following labs and imaging studies  CBC: Recent Labs  Lab 08/25/21 2237 08/26/21 1352 08/27/21 0203 08/28/21 0418  WBC 6.1 7.8 7.3 5.2  NEUTROABS 4.6 5.3 4.8  --   HGB 9.0* 10.0* 9.7* 8.8*  HCT 28.8* 32.8* 30.7* 28.2*  MCV 83.7 85.9 82.5 82.2  PLT 238 211 177 537    Basic Metabolic Panel: Recent Labs  Lab 08/25/21 2237 08/26/21 1352 08/27/21 0203 08/28/21 0418  NA 136 135 135 134*  K 4.2 4.4 3.9 4.3  CL 105 104 103 106  CO2 22 21* 21* 23  GLUCOSE 123* 104* 141* 117*  BUN 17 16 17 15   CREATININE 0.88 0.84 0.90 0.76  CALCIUM 8.5* 8.3* 8.3* 7.8*  MG  --  1.4* 1.4* 2.1     GFR: Estimated Creatinine Clearance: 60.6 mL/min (by C-G formula based on SCr of 0.76 mg/dL). Liver Function Tests: Recent Labs  Lab 08/25/21 2237 08/26/21 1352 08/27/21 0203  AST 33 29 29  ALT 25 23 22   ALKPHOS 82 71 79  BILITOT 0.5 0.4 0.5  PROT 6.3* 6.5 6.2*  ALBUMIN 2.8* 2.8* 2.6*    No results for input(s): LIPASE, AMYLASE in the last 168 hours. No results for input(s): AMMONIA in the last 168 hours. Coagulation Profile: Recent Labs  Lab 08/25/21 2237  INR 1.5*    Cardiac Enzymes: No results for input(s): CKTOTAL, CKMB, CKMBINDEX, TROPONINI in the last 168 hours. BNP (last 3 results) No results for input(s): PROBNP in the last 8760 hours. HbA1C: Recent Labs    08/27/21 0203  HGBA1C 6.4*    CBG: Recent Labs  Lab 08/27/21 1242 08/27/21 1635 08/27/21 2250 08/28/21 0834 08/28/21 1205  GLUCAP 169* 104* 108* 99 145*    Lipid Profile: No results for input(s): CHOL, HDL, LDLCALC, TRIG, CHOLHDL, LDLDIRECT in the last  72 hours. Thyroid Function Tests: No results for input(s): TSH, T4TOTAL, FREET4, T3FREE, THYROIDAB in the last 72 hours. Anemia Panel: Recent Labs    08/28/21 0418  VITAMINB12 402  FOLATE 12.1  FERRITIN 52  TIBC 235*  IRON 43  RETICCTPCT 1.1   Sepsis Labs: Recent Labs  Lab 08/25/21 2237  LATICACIDVEN 0.9     Recent Results (from the past 240 hour(s))  Blood Culture (routine x 2)     Status: None (Preliminary result)   Collection Time: 08/25/21 10:30 PM   Specimen: BLOOD  Result Value Ref Range Status   Specimen Description BLOOD RIGHT ANTECUBITAL  Final   Special Requests   Final    BOTTLES DRAWN AEROBIC AND ANAEROBIC Blood Culture results may not be optimal due to an inadequate volume of blood received in culture bottles   Culture   Final    NO GROWTH 3 DAYS Performed at [facility], [place] [address] [place], [place] [address]    Report Status PENDING  Incomplete  Resp Panel by RT-PCR (Flu A&B, Covid)  Nasopharyngeal Swab     Status: Abnormal   Collection Time: 08/25/21 10:33 PM   Specimen: Nasopharyngeal Swab; Nasopharyngeal(NP) swabs in vial transport medium  Result Value Ref Range Status   SARS Coronavirus 2 by RT PCR POSITIVE (A) NEGATIVE Final    Comment: (NOTE) SARS-CoV-2 target nucleic acids are DETECTED.  The SARS-CoV-2 RNA is generally detectable in upper respiratory specimens during the acute phase of infection. Positive results are indicative of the presence of the identified virus, but do not rule out bacterial infection or co-infection with other pathogens not detected by the test. Clinical correlation with patient history and other diagnostic information is necessary to determine patient infection status. The expected result is Negative.  Fact Sheet for Patients: EntrepreneurPulse.com.au  Fact Sheet for Healthcare Providers: IncredibleEmployment.be  This test is not yet approved or cleared by the [place] FDA and  has been authorized for detection and/or diagnosis of SARS-CoV-2 by FDA under an Emergency Use Authorization (EUA).  This EUA will remain in effect (meaning this test can be used) for the duration of  the COVID-19 declaration under Section 564(b)(1) of the A ct, 21 U.S.C. section 360bbb-3(b)(1), unless the authorization is terminated or revoked sooner.     Influenza A by PCR NEGATIVE NEGATIVE Final   Influenza B by PCR NEGATIVE NEGATIVE Final    Comment: (NOTE) The Xpert Xpress SARS-CoV-2/FLU/RSV plus assay is intended as an aid in the diagnosis of influenza from Nasopharyngeal swab specimens and should not be used as a sole basis for treatment. Nasal washings and aspirates are unacceptable for Xpert Xpress SARS-CoV-2/FLU/RSV testing.  Fact Sheet for Patients: EntrepreneurPulse.com.au  Fact Sheet for Healthcare Providers: IncredibleEmployment.be  This test is not yet  approved or cleared by the [place] FDA and has been authorized for detection and/or diagnosis of SARS-CoV-2 by FDA under an Emergency Use Authorization (EUA). This EUA will remain in effect (meaning this test can be used) for the duration of the COVID-19 declaration under Section 564(b)(1) of the Act, 21 U.S.C. section 360bbb-3(b)(1), unless the authorization is terminated or revoked.  Performed at [facility], [place] [address], [place], [place] [address]   Blood Culture (routine x 2)     Status: None (Preliminary result)   Collection Time: 08/25/21 10:36 PM   Specimen: BLOOD RIGHT HAND  Result Value Ref Range Status   Specimen Description BLOOD RIGHT HAND  Final  Special Requests   Final    BOTTLES DRAWN AEROBIC AND ANAEROBIC Blood Culture results may not be optimal due to an inadequate volume of blood received in culture bottles   Culture   Final    NO GROWTH 3 DAYS Performed at [facility], [place] [address], [place], [place] [address]    Report Status PENDING  Incomplete  Urine Culture     Status: Abnormal   Collection Time: 08/25/21 11:09 PM   Specimen: In/Out Cath Urine  Result Value Ref Range Status   Specimen Description IN/OUT CATH URINE  Final   Special Requests   Final    NONE Performed at [facility], [place] [address], [place], [place] [address]    Culture >=100,000 COLONIES/mL ESCHERICHIA COLI (A)  Final   Report Status 08/28/2021 FINAL  Final   Organism ID, Bacteria ESCHERICHIA COLI (A)  Final      Susceptibility   Escherichia coli - MIC*    AMPICILLIN >=32 RESISTANT Resistant     CEFAZOLIN <=4 SENSITIVE Sensitive     CEFEPIME <=0.12 SENSITIVE Sensitive     CEFTRIAXONE <=0.25 SENSITIVE Sensitive     CIPROFLOXACIN <=0.25 SENSITIVE Sensitive     GENTAMICIN >=16 RESISTANT Resistant     IMIPENEM <=0.25 SENSITIVE Sensitive     NITROFURANTOIN <=16 SENSITIVE Sensitive     TRIMETH/SULFA >=320 RESISTANT Resistant     AMPICILLIN/SULBACTAM >=32  RESISTANT Resistant     PIP/TAZO <=4 SENSITIVE Sensitive     * >=100,000 COLONIES/mL ESCHERICHIA COLI          Radiology Studies: No results found.      Scheduled Meds:  apixaban  5 mg Oral BID   vitamin C  500 mg Oral Daily   atorvastatin  80 mg Oral QHS   carvedilol  6.25 mg Oral BID   docusate sodium  100 mg Oral Daily   ezetimibe  10 mg Oral QHS   ferrous gluconate  324 mg Oral BID   fluticasone  1 spray Each Nare Daily   guaiFENesin  600 mg Oral BID   insulin aspart  0-15 Units Subcutaneous TID AC & HS   loratadine  10 mg Oral Daily   losartan  50 mg Oral QHS   zinc sulfate  220 mg Oral Daily   Continuous Infusions:  cefTRIAXone (ROCEPHIN)  IV Stopped (08/28/21 0920)     LOS: 2 days    Time spent: 35 minutes.     Elmarie Shiley, MD [facility]   If 7PM-7AM, please contact night-coverage [email or web address]  08/28/2021, 2:17 PM

## 2021-08-28 NOTE — Plan of Care (Signed)
°  Problem: Health Behavior/Discharge Planning: Goal: Ability to manage health-related needs will improve Outcome: Progressing   Problem: Clinical Measurements: Goal: Respiratory complications will improve Outcome: Progressing   Problem: Activity: Goal: Risk for activity intolerance will decrease Outcome: Progressing   Problem: Nutrition: Goal: Adequate nutrition will be maintained Outcome: Progressing   Problem: Coping: Goal: Level of anxiety will decrease Outcome: Progressing

## 2021-08-29 DIAGNOSIS — R41841 Cognitive communication deficit: Secondary | ICD-10-CM | POA: Diagnosis not present

## 2021-08-29 DIAGNOSIS — R2689 Other abnormalities of gait and mobility: Secondary | ICD-10-CM | POA: Diagnosis not present

## 2021-08-29 DIAGNOSIS — M6281 Muscle weakness (generalized): Secondary | ICD-10-CM | POA: Diagnosis not present

## 2021-08-29 DIAGNOSIS — E118 Type 2 diabetes mellitus with unspecified complications: Secondary | ICD-10-CM | POA: Diagnosis not present

## 2021-08-29 DIAGNOSIS — M79601 Pain in right arm: Secondary | ICD-10-CM | POA: Diagnosis not present

## 2021-08-29 DIAGNOSIS — U071 COVID-19: Secondary | ICD-10-CM | POA: Diagnosis not present

## 2021-08-29 DIAGNOSIS — G9341 Metabolic encephalopathy: Secondary | ICD-10-CM | POA: Diagnosis not present

## 2021-08-29 DIAGNOSIS — Z86711 Personal history of pulmonary embolism: Secondary | ICD-10-CM | POA: Diagnosis not present

## 2021-08-29 DIAGNOSIS — B9620 Unspecified Escherichia coli [E. coli] as the cause of diseases classified elsewhere: Secondary | ICD-10-CM | POA: Diagnosis not present

## 2021-08-29 DIAGNOSIS — M25669 Stiffness of unspecified knee, not elsewhere classified: Secondary | ICD-10-CM | POA: Diagnosis not present

## 2021-08-29 DIAGNOSIS — E6609 Other obesity due to excess calories: Secondary | ICD-10-CM | POA: Diagnosis not present

## 2021-08-29 DIAGNOSIS — Q670 Congenital facial asymmetry: Secondary | ICD-10-CM | POA: Diagnosis not present

## 2021-08-29 DIAGNOSIS — M48061 Spinal stenosis, lumbar region without neurogenic claudication: Secondary | ICD-10-CM | POA: Diagnosis not present

## 2021-08-29 DIAGNOSIS — I4891 Unspecified atrial fibrillation: Secondary | ICD-10-CM | POA: Diagnosis not present

## 2021-08-29 DIAGNOSIS — R5381 Other malaise: Secondary | ICD-10-CM | POA: Diagnosis not present

## 2021-08-29 DIAGNOSIS — E11621 Type 2 diabetes mellitus with foot ulcer: Secondary | ICD-10-CM | POA: Diagnosis not present

## 2021-08-29 DIAGNOSIS — M5416 Radiculopathy, lumbar region: Secondary | ICD-10-CM | POA: Diagnosis not present

## 2021-08-29 DIAGNOSIS — Z7401 Bed confinement status: Secondary | ICD-10-CM | POA: Diagnosis not present

## 2021-08-29 DIAGNOSIS — M1990 Unspecified osteoarthritis, unspecified site: Secondary | ICD-10-CM | POA: Diagnosis not present

## 2021-08-29 DIAGNOSIS — N390 Urinary tract infection, site not specified: Secondary | ICD-10-CM | POA: Diagnosis not present

## 2021-08-29 DIAGNOSIS — L97422 Non-pressure chronic ulcer of left heel and midfoot with fat layer exposed: Secondary | ICD-10-CM | POA: Diagnosis not present

## 2021-08-29 DIAGNOSIS — J069 Acute upper respiratory infection, unspecified: Secondary | ICD-10-CM | POA: Diagnosis not present

## 2021-08-29 DIAGNOSIS — R262 Difficulty in walking, not elsewhere classified: Secondary | ICD-10-CM | POA: Diagnosis not present

## 2021-08-29 DIAGNOSIS — R4182 Altered mental status, unspecified: Secondary | ICD-10-CM | POA: Diagnosis not present

## 2021-08-29 LAB — GLUCOSE, CAPILLARY
Glucose-Capillary: 118 mg/dL — ABNORMAL HIGH (ref 70–99)
Glucose-Capillary: 104 mg/dL — ABNORMAL HIGH (ref 70–99)

## 2021-08-29 MED ORDER — CEPHALEXIN 500 MG PO CAPS: 500.0000 mg | ORAL_CAPSULE | Freq: Two times a day (BID) | ORAL | 0 refills | Status: AC

## 2021-08-29 MED ORDER — BENZONATATE 100 MG PO CAPS: 100.0000 mg | ORAL_CAPSULE | Freq: Three times a day (TID) | ORAL | 0 refills | Status: DC | PRN

## 2021-08-29 MED ORDER — ZINC SULFATE 220 (50 ZN) MG PO CAPS: 220.0000 mg | ORAL_CAPSULE | Freq: Every day | ORAL | 0 refills | Status: DC

## 2021-08-29 MED ORDER — GABAPENTIN 100 MG PO CAPS: 200.0000 mg | ORAL_CAPSULE | Freq: Two times a day (BID) | ORAL | Status: DC

## 2021-08-29 MED ORDER — ASCORBIC ACID 500 MG PO TABS: 500.0000 mg | ORAL_TABLET | Freq: Every day | ORAL | 0 refills | Status: DC

## 2021-08-29 MED ORDER — IPRATROPIUM-ALBUTEROL 0.5-2.5 (3) MG/3ML IN SOLN: 3.0000 mL | RESPIRATORY_TRACT | 0 refills | Status: DC | PRN

## 2021-08-29 MED ORDER — CEPHALEXIN 500 MG PO CAPS
500.0000 mg | ORAL_CAPSULE | Freq: Two times a day (BID) | ORAL | Status: DC
  Administered 2021-08-29: 500 mg via ORAL
  Filled 2021-08-29: qty 1

## 2021-08-29 NOTE — TOC Transition Note (Signed)
Transition of Care [facility]) - CM/SW Discharge Note   Patient Details  Name: LYNDEN CARRITHERS MRN: [id number] Date of Birth: 11/23/1940  [facility] CM/SW Contact:  Milinda Antis, [place] Phone Number: 08/29/2021, 12:33 PM   Clinical Narrative:    Patient will DC to: [facility] Anticipated DC date:  08/29/2021 Family notified:  Myia, Bergh (Son)  [phone]  Transport by:  Corey Harold   Per MD patient ready for DC to SNF. RN to call report prior to discharge [phone] room 406B. RN, patient, patient's family, and facility notified of DC. Discharge Summary sent to facility. DC packet on chart. Ambulance transport will be requested for patient.   CSW will sign off for now as social work intervention is no longer needed. Please consult [place] again if new needs arise.     Final next level of care: Skilled Nursing Facility Barriers to Discharge: No Barriers Identified   Patient Goals and CMS Choice        Discharge Placement              Patient [facility] bed at:  [facility]) Patient to be transferred to facility by: [place] Name of family member notified: Daejah, Klebba (Son)   [phone] Patient and family notified of of transfer: 08/29/21  Discharge Plan and Services                                     Social Determinants of Health (SDOH) Interventions     Readmission Risk Interventions No flowsheet data found.

## 2021-08-29 NOTE — Discharge Summary (Signed)
Physician Discharge Summary  Kayla [name] QIW:[id number] DOB: Jul 83, 1942 DOA: 08/25/2021  PCP: Denita Lung, MD  Admit date: 08/25/2021 Discharge date: 08/29/2021  Admitted From: SNF Disposition:  SNF  Recommendations for Outpatient Follow-up:  Follow up with PCP in 1-2 weeks Please obtain BMP/CBC in one week Follow on resolution of UTI.  Need MRI brain in 1 year to follow meningioma.   Discharge Condition: Stable.  CODE STATUS: Full code Diet recommendation: Heart Healthy / Carb Modified   Brief/Interim Summary: 81 year old with past medical history significant for spinal stenosis and limited mobility, chronic A-flutter on Eliquis, hypertension, insulin-dependent diabetes, hyperlipidemia who presents to [facility] emergency department from [facility]  health rehab due to concerns over left facial droop, slurred speech and confusion. Per facility report they noticed left facial droop, facility did not know when the symptoms started.  Patient was recently treated for urinary tract infection.   Evaluation in the ED she was found to have a temperature of 102, tachycardic, UA suggestive of UTI.  COVID PCR positive.  CT head show low-density findings in the occipital lobe that the radiologist question was posterior reversible encephalopathy syndrome.  Case was discussed with neurologist on-call Dr. Leonel Ramsay who did not feel that patient fit that clinical picture.  MRI was recommended.     1-Acute Metabolic Encephalopathy Patient presenting with a history of lethargy confusion. Multifactorial secondary to COVID infection as well as UTI. Resolved.    2-COVID-19 infection: Third with fever, confusion. She had Covid vaccine.  Patient was started on Paxlovid but is contraindicated because patient takes Xarelto. Completed 3 days of Remdesivir 1/29 Follow inflammatory markers. Trending down.  Chest x ray: no active diseases.  Report mild cough. No hypoxemia.  Stable.   COVID-19 Labs    Recent Labs (last 2 labs)        Recent Labs    08/26/21 1352 08/27/21 0203 08/28/21 0418  DDIMER 1.38*  --   --   FERRITIN  --   --  52  CRP 4.9* 7.2* 4.5*         Recent Labs       Lab Results  Component Value Date    SARSCOV2NAA POSITIVE (A) 08/25/2021    SARSCOV2NAA NEGATIVE 06/30/2021    SARSCOV2NAA NEGATIVE 06/22/2021    [place] NEGATIVE 03/15/2021      3-Acute cystitis without hematuria: UA with more than 50 white blood cell Blood cultures: No growth to date.  Urine culture: 100,000 colonies E coli. Resistant to Bactrim, ampicillin.  Treated with IV ceftriaxone while in the hospital.  Plan to complete total of 5 days antibiotics. Discharge on Keflex for 2 days.   4-Facial droop: Both facility as well as EMS noticed a left facial droop. Admitting physician and myself we do not appreciate significant left facial droop MRI negative for acute stroke Resolved.    Ambulatory dysfunction: PT OT  Spinal Stenosis of lumbar region at multiple  Patient declined surgery intervention in the past.   A-flutter: Continue with Eliquis.   83 cm inferior right frontal meningioma; Neurosurgery recommend follow MRI in one year with PCP.    Pressure ulcer left heel:  X-ray: No acute fracture or dislocation, skin ulceration over the heel, diffuse soft tissue edema, hallux valgus. Continue with wound care.    Hyperlipidemia associated with type 2 diabetes: SSI   Hypomagnesemia; resolved.  Anemia of chronic diseases.  See wound documentation bellow.    Diabetes type 2;  She has only require  6 units SSI in 24 hours.  Will hold levemir at discharge. She only uses levemir while on steroids per records.    Pressure Injury 06/28/21 Heel Left Deep Tissue Pressure Injury - Purple or maroon localized area of discolored intact skin or blood-filled blister due to damage of underlying soft tissue from pressure and/or shear. (Active)  06/28/21 0930  Location: Heel  Location  Orientation: Left  Staging: Deep Tissue Pressure Injury - Purple or maroon localized area of discolored intact skin or blood-filled blister due to damage of underlying soft tissue from pressure and/or shear.  Wound Description (Comments):   Present on Admission: Yes        Discharge Diagnoses:  Principal Problem:   Acute metabolic encephalopathy Active Problems:   Hyperlipidemia associated with type 2 diabetes mellitus (HCC)   Atrial flutter (HCC)   Ambulatory dysfunction   Spinal stenosis of lumbar region at multiple levels   Acute cystitis without hematuria   COVID-19 virus infection   Facial droop   Pressure ulcer of left heel    Discharge Instructions  Discharge Instructions     Diet - low sodium heart healthy   Complete by: As directed    Discharge wound care:   Complete by: As directed    See above   Increase activity slowly   Complete by: As directed       Allergies as of 08/29/2021   No Known Allergies      Medication List     STOP taking these medications    ammonium lactate 12 % lotion Commonly known as: LAC-HYDRIN   carbamide peroxide 6.5 % OTIC solution Commonly known as: DEBROX   insulin detemir 100 UNIT/ML injection Commonly known as: LEVEMIR   predniSONE 10 MG tablet Commonly known as: DELTASONE       TAKE these medications    Accu-Chek Softclix Lancets lancets 1 each by Other route daily. Use as instructed   acetaminophen 325 MG tablet Commonly known as: TYLENOL Take 650 mg by mouth every 8 (eight) hours as needed for mild pain or moderate pain. What changed: Another medication with the same name was removed. Continue taking this medication, and follow the directions you see here.   acetaminophen 500 MG tablet Commonly known as: TYLENOL Take 500 mg by mouth 2 (two) times daily. What changed: Another medication with the same name was removed. Continue taking this medication, and follow the directions you see here.   apixaban 5  MG Tabs tablet Commonly known as: ELIQUIS Take 1 tablet (5 mg total) by mouth 2 (two) times daily.   ascorbic acid 500 MG tablet Commonly known as: VITAMIN C Take 1 tablet (500 mg total) by mouth daily. Start taking on: August 30, 2021   atorvastatin 80 MG tablet Commonly known as: LIPITOR Take 1 tablet (80 mg total) by mouth daily. What changed: when to take this   benzonatate 100 MG capsule Commonly known as: TESSALON Take 1 capsule (100 mg total) by mouth 3 (three) times daily as needed for cough.   carvedilol 6.25 MG tablet Commonly known as: COREG Take 1 tablet (6.25 mg total) by mouth 2 (two) times daily.   cephALEXin 500 MG capsule Commonly known as: KEFLEX Take 1 capsule (500 mg total) by mouth 2 (two) times daily for 2 days.   Claritin 10 MG tablet Generic drug: loratadine Take 10 mg by mouth daily.   collagenase ointment Commonly known as: SANTYL Apply 1 application topically daily.   diclofenac Sodium  1 % Gel Commonly known as: VOLTAREN Apply 2 g topically 4 (four) times daily. Rub into affected area of foot 2 to 4 times daily What changed:  when to take this reasons to take this additional instructions   docusate sodium 100 MG capsule Commonly known as: COLACE Take 100 mg by mouth daily.   ezetimibe 10 MG tablet Commonly known as: ZETIA Take 1 tablet (10 mg total) by mouth daily. What changed: when to take this   ferrous gluconate 324 MG tablet Commonly known as: FERGON Take 324 mg by mouth 2 (two) times daily.   fluticasone 50 MCG/ACT nasal spray Commonly known as: FLONASE Place 1 spray into both nostrils daily.   furosemide 20 MG tablet Commonly known as: LASIX TAKE 1 TABLET (20 MG TOTAL) BY MOUTH DAILY AS NEEDED (TAKE AS NEEDED FOR SWELLING).   gabapentin 100 MG capsule Commonly known as: NEURONTIN Take 2 capsules (200 mg total) by mouth 2 (two) times daily.   glucose blood test strip 1 each by Other route as needed for other. Use as  instructed What changed: when to take this   guaiFENesin 600 MG 12 hr tablet Commonly known as: MUCINEX Take 600 mg by mouth 2 (two) times daily.   ipratropium-albuterol 0.5-2.5 (3) MG/3ML Soln Commonly known as: DUONEB Take 3 mLs by nebulization every 4 (four) hours as needed.   lidocaine 4 % dressing Apply 2 patches topically daily.   losartan 50 MG tablet Commonly known as: COZAAR Take 1 tablet (50 mg total) by mouth daily. What changed: when to take this   melatonin 5 MG Tabs Take 5 mg by mouth at bedtime as needed (sleep).   metFORMIN 500 MG tablet Commonly known as: GLUCOPHAGE Take 1 tablet (500 mg total) by mouth 2 (two) times daily with a meal.   nitroGLYCERIN 0.4 MG SL tablet Commonly known as: NITROSTAT Take up to 3 tablets What changed:  how much to take when to take this reasons to take this additional instructions   TRIAMCINOLONE ACETONIDE (TOP) 0.05 % Oint Apply 1 application topically 2 (two) times daily as needed (rough skin on heels).   zinc sulfate 220 (50 Zn) MG capsule Take 1 capsule (220 mg total) by mouth daily. Start taking on: August 30, 2021               Discharge Care Instructions  (From admission, onward)           Start     Ordered   08/29/21 0000  Discharge wound care:       Comments: See above   08/29/21 1041            No Known Allergies  Consultations: None   Procedures/Studies: CT HEAD WO CONTRAST (5MM)  Result Date: 08/25/2021 CLINICAL DATA:  Mental status change. EXAM: CT HEAD WITHOUT CONTRAST TECHNIQUE: Contiguous axial images were obtained from the base of the skull through the vertex without intravenous contrast. RADIATION DOSE REDUCTION: This exam was performed according to the departmental dose-optimization program which includes automated exposure control, adjustment of the mA and/or kV according to patient size and/or use of iterative reconstruction technique. COMPARISON:  Remote head CT  02/01/2011 FINDINGS: Brain: No acute hemorrhage. Normal for age atrophy. There is prominent periventricular and deep white matter hypodensity typical of chronic small vessel ischemia. There is also mild subcortical low-density within both posterior occipital lobes. No evidence of acute infarct. Right greater than left basal gangliar mineralization, often senescent. No subdural or  extra-axial collection. No midline shift or evident pulmonary mass. Vascular: No hyperdense vessel. Skull: Osseous density arising from the inner table of the inferior right frontal bone is stable from 20 2012 exam and considered benign. No skull fracture. Sinuses/Orbits: Occasional mucosal thickening of ethmoid air cells. The mastoid air cells are clear. No acute orbital findings. Other: None. IMPRESSION: Normal for age atrophy with prominent periventricular chronic small vessel ischemia. There is also symmetric low-density in the subcortical occipital lobes, likely representing chronic ischemic change, however the possibility of posterior reversible encephalopathy syndrome is also considered. As clinically indicated, recommend further assessment with MRI. Electronically Signed   By: Keith Rake M.D.   On: 08/25/2021 23:04   MR Brain Wo Contrast (neuro protocol)  Result Date: 08/26/2021 CLINICAL DATA:  Neuro deficit with acute stroke suspected EXAM: MRI HEAD WITHOUT CONTRAST TECHNIQUE: Multiplanar, multiecho pulse sequences of the brain and surrounding structures were obtained without intravenous contrast. COMPARISON:  Head CT from yesterday.  Brain MRI 02/04/2011 FINDINGS: Brain: No acute infarction, hemorrhage, hydrocephalus, extra-axial collection or mass effect. FLAIR hyperintensity in the cerebral white matter attributed to extensive chronic small vessel ischemia. Cortical thinning symmetrically at the bilateral perirolandic regions, also suggested on prior. Right inferior frontal meningioma measuring 1 cm. Vascular: Major  flow voids are preserved Skull and upper cervical spine: Generalized cervical degenerative facet spurring with mild anterolisthesis at C3-4. Normal marrow signal. Sinuses/Orbits: Bilateral ethmoid mucosal thickening. IMPRESSION: 1. No acute finding. 2. Confluent chronic small vessel ischemia in the hemispheric white matter that is progressed from 20/12. 3. 1 cm inferior right frontal meningioma. Electronically Signed   By: Jorje Guild M.D.   On: 08/26/2021 07:49   DG Chest Port 1 View  Result Date: 08/25/2021 CLINICAL DATA:  Concern for sepsis. EXAM: PORTABLE CHEST 1 VIEW COMPARISON:  Chest radiograph dated 03/05/2021. FINDINGS: No focal consolidation, pleural effusion, or pneumothorax. The cardiac silhouette is within normal limits. Atherosclerotic calcification of the aorta. No acute osseous pathology. Degenerative changes of the spine. IMPRESSION: No active cardiopulmonary disease. Electronically Signed   By: Anner Crete M.D.   On: 08/25/2021 23:35   DG Foot Complete Left  Result Date: 08/25/2021 CLINICAL DATA:  Skin wound over the heel. EXAM: LEFT FOOT - COMPLETE 3+ VIEW COMPARISON:  Left ankle radiograph dated 03/10/2021. FINDINGS: No acute fracture or dislocation. Mild osteopenia. There is hallux valgus. There is degenerative changes of the ankle and metatarsal joints with spurring. There is ulceration of the skin over the healed. No radiopaque foreign object. There is diffuse soft tissue edema. IMPRESSION: 1. No acute fracture or dislocation. 2. Skin ulceration over the heel. 3. Diffuse soft tissue edema. 4. Hallux valgus. Electronically Signed   By: Anner Crete M.D.   On: 08/25/2021 23:36     Subjective: Feeling well, cough stable.   Discharge Exam: Vitals:   08/28/21 2119 08/29/21 0734  BP: (!) 150/68 (!) 143/63  Pulse: 76 74  Resp: 20 18  Temp: 98.8 F (37.1 C)   SpO2: 97% 97%     General: Pt is alert, awake, not in acute distress Cardiovascular: RRR, S1/S2 +, no  rubs, no gallops Respiratory: CTA bilaterally, no wheezing, no rhonchi Abdominal: Soft, NT, ND, bowel sounds + Extremities: no edema, no cyanosis    The results of significant diagnostics from this hospitalization (including imaging, microbiology, ancillary and laboratory) are listed below for reference.     Microbiology: Recent Results (from the past 240 hour(s))  Blood Culture (routine x 2)  Status: None (Preliminary result)   Collection Time: 08/25/21 10:30 PM   Specimen: BLOOD  Result Value Ref Range Status   Specimen Description BLOOD RIGHT ANTECUBITAL  Final   Special Requests   Final    BOTTLES DRAWN AEROBIC AND ANAEROBIC Blood Culture results may not be optimal due to an inadequate volume of blood received in culture bottles   Culture   Final    NO GROWTH 3 DAYS Performed at [facility], [place] [address], [place], [place] [address]    Report Status PENDING  Incomplete  Resp Panel by RT-PCR (Flu A&B, Covid) Nasopharyngeal Swab     Status: Abnormal   Collection Time: 08/25/21 10:33 PM   Specimen: Nasopharyngeal Swab; Nasopharyngeal(NP) swabs in vial transport medium  Result Value Ref Range Status   SARS Coronavirus 2 by RT PCR POSITIVE (A) NEGATIVE Final    Comment: (NOTE) SARS-CoV-2 target nucleic acids are DETECTED.  The SARS-CoV-2 RNA is generally detectable in upper respiratory specimens during the acute phase of infection. Positive results are indicative of the presence of the identified virus, but do not rule out bacterial infection or co-infection with other pathogens not detected by the test. Clinical correlation with patient history and other diagnostic information is necessary to determine patient infection status. The expected result is Negative.  Fact Sheet for Patients: EntrepreneurPulse.com.au  Fact Sheet for Healthcare Providers: IncredibleEmployment.be  This test is not yet approved or cleared by the [place] FDA and  has been authorized for detection and/or diagnosis of SARS-CoV-2 by FDA under an Emergency Use Authorization (EUA).  This EUA will remain in effect (meaning this test can be used) for the duration of  the COVID-19 declaration under Section 564(b)(1) of the A ct, 21 U.S.C. section 360bbb-3(b)(1), unless the authorization is terminated or revoked sooner.     Influenza A by PCR NEGATIVE NEGATIVE Final   Influenza B by PCR NEGATIVE NEGATIVE Final    Comment: (NOTE) The Xpert Xpress SARS-CoV-2/FLU/RSV plus assay is intended as an aid in the diagnosis of influenza from Nasopharyngeal swab specimens and should not be used as a sole basis for treatment. Nasal washings and aspirates are unacceptable for Xpert Xpress SARS-CoV-2/FLU/RSV testing.  Fact Sheet for Patients: EntrepreneurPulse.com.au  Fact Sheet for Healthcare Providers: IncredibleEmployment.be  This test is not yet approved or cleared by the [place] FDA and has been authorized for detection and/or diagnosis of SARS-CoV-2 by FDA under an Emergency Use Authorization (EUA). This EUA will remain in effect (meaning this test can be used) for the duration of the COVID-19 declaration under Section 564(b)(1) of the Act, 21 U.S.C. section 360bbb-3(b)(1), unless the authorization is terminated or revoked.  Performed at [facility], [place] [address], [place], [place] [address]   Blood Culture (routine x 2)     Status: None (Preliminary result)   Collection Time: 08/25/21 10:36 PM   Specimen: BLOOD RIGHT HAND  Result Value Ref Range Status   Specimen Description BLOOD RIGHT HAND  Final   Special Requests   Final    BOTTLES DRAWN AEROBIC AND ANAEROBIC Blood Culture results may not be optimal due to an inadequate volume of blood received in culture bottles   Culture   Final    NO GROWTH 3 DAYS Performed at [facility], [place] [address] [place], [place] [address]     Report Status PENDING  Incomplete  Urine Culture     Status: Abnormal   Collection Time: 08/25/21  11:09 PM   Specimen: In/Out Cath Urine  Result Value Ref Range Status   Specimen Description IN/OUT CATH URINE  Final   Special Requests   Final    NONE Performed at [facility], [address] [place], [place] [address]    Culture >=100,000 COLONIES/mL ESCHERICHIA COLI (A)  Final   Report Status 08/28/2021 FINAL  Final   Organism ID, Bacteria ESCHERICHIA COLI (A)  Final      Susceptibility   Escherichia coli - MIC*    AMPICILLIN >=32 RESISTANT Resistant     CEFAZOLIN <=4 SENSITIVE Sensitive     CEFEPIME <=0.12 SENSITIVE Sensitive     CEFTRIAXONE <=0.25 SENSITIVE Sensitive     CIPROFLOXACIN <=0.25 SENSITIVE Sensitive     GENTAMICIN >=16 RESISTANT Resistant     IMIPENEM <=0.25 SENSITIVE Sensitive     NITROFURANTOIN <=16 SENSITIVE Sensitive     TRIMETH/SULFA >=320 RESISTANT Resistant     AMPICILLIN/SULBACTAM >=32 RESISTANT Resistant     PIP/TAZO <=4 SENSITIVE Sensitive     * >=100,000 COLONIES/mL ESCHERICHIA COLI     Labs: BNP (last 3 results) Recent Labs    03/04/21 2353  BNP 67.8   Basic Metabolic Panel: Recent Labs  Lab 08/25/21 2237 08/26/21 1352 08/27/21 0203 08/28/21 0418  NA 136 135 135 134*  K 4.2 4.4 3.9 4.3  CL 105 104 103 106  CO2 22 21* 21* 23  GLUCOSE 123* 104* 141* 117*  BUN 17 16 17 15   CREATININE 0.88 0.84 0.90 0.76  CALCIUM 8.5* 8.3* 8.3* 7.8*  MG  --  1.4* 1.4* 2.1   Liver Function Tests: Recent Labs  Lab 08/25/21 2237 08/26/21 1352 08/27/21 0203  AST 33 29 29  ALT 25 23 22   ALKPHOS 82 71 79  BILITOT 0.5 0.4 0.5  PROT 6.3* 6.5 6.2*  ALBUMIN 2.8* 2.8* 2.6*   No results for input(s): LIPASE, AMYLASE in the last 168 hours. No results for input(s): AMMONIA in the last 168 hours. CBC: Recent Labs  Lab 08/25/21 2237 08/26/21 1352 08/27/21 0203 08/28/21 0418  WBC 6.1 7.8 7.3 5.2  NEUTROABS 4.6 5.3 4.8  --   HGB 9.0* 10.0* 9.7*  8.8*  HCT 28.8* 32.8* 30.7* 28.2*  MCV 83.7 85.9 82.5 82.2  PLT 238 211 177 228   Cardiac Enzymes: No results for input(s): CKTOTAL, CKMB, CKMBINDEX, TROPONINI in the last 168 hours. BNP: Invalid input(s): POCBNP CBG: Recent Labs  Lab 08/28/21 0834 08/28/21 1205 08/28/21 1702 08/28/21 2056 08/29/21 0610  GLUCAP 99 145* 130* 132* 118*   D-Dimer Recent Labs    08/26/21 1352  DDIMER 1.38*   Hgb A1c Recent Labs    08/27/21 0203  HGBA1C 6.4*   Lipid Profile No results for input(s): CHOL, HDL, LDLCALC, TRIG, CHOLHDL, LDLDIRECT in the last 72 hours. Thyroid function studies No results for input(s): TSH, T4TOTAL, T3FREE, THYROIDAB in the last 72 hours.  Invalid input(s): FREET3 Anemia work up Recent Labs    08/28/21 0418  VITAMINB12 402  FOLATE 12.1  FERRITIN 52  TIBC 235*  IRON 43  RETICCTPCT 1.1   Urinalysis    Component Value Date/Time   COLORURINE YELLOW 08/26/2021 0245   APPEARANCEUR TURBID (A) 08/26/2021 0245   LABSPEC 1.018 08/26/2021 0245   PHURINE 5.0 08/26/2021 0245   GLUCOSEU NEGATIVE 08/26/2021 0245   HGBUR MODERATE (A) 08/26/2021 0245   BILIRUBINUR NEGATIVE 08/26/2021 0245   BILIRUBINUR n 11/27/2016 1129   [place] 08/26/2021 0245  PROTEINUR 100 (A) 08/26/2021 0245   UROBILINOGEN negative (A) 11/27/2016 1129   UROBILINOGEN 0.2 12/01/2010 2352   NITRITE POSITIVE (A) 08/26/2021 0245   LEUKOCYTESUR LARGE (A) 08/26/2021 0245   Sepsis Labs Invalid input(s): PROCALCITONIN,  WBC,  LACTICIDVEN Microbiology Recent Results (from the past 240 hour(s))  Blood Culture (routine x 2)     Status: None (Preliminary result)   Collection Time: 08/25/21 10:30 PM   Specimen: BLOOD  Result Value Ref Range Status   Specimen Description BLOOD RIGHT ANTECUBITAL  Final   Special Requests   Final    BOTTLES DRAWN AEROBIC AND ANAEROBIC Blood Culture results may not be optimal due to an inadequate volume of blood received in culture bottles   Culture    Final    NO GROWTH 3 DAYS Performed at [facility], [place] [address] [place], [place] [address]    Report Status PENDING  Incomplete  Resp Panel by RT-PCR (Flu A&B, Covid) Nasopharyngeal Swab     Status: Abnormal   Collection Time: 08/25/21 10:33 PM   Specimen: Nasopharyngeal Swab; Nasopharyngeal(NP) swabs in vial transport medium  Result Value Ref Range Status   SARS Coronavirus 2 by RT PCR POSITIVE (A) NEGATIVE Final    Comment: (NOTE) SARS-CoV-2 target nucleic acids are DETECTED.  The SARS-CoV-2 RNA is generally detectable in upper respiratory specimens during the acute phase of infection. Positive results are indicative of the presence of the identified virus, but do not rule out bacterial infection or co-infection with other pathogens not detected by the test. Clinical correlation with patient history and other diagnostic information is necessary to determine patient infection status. The expected result is Negative.  Fact Sheet for Patients: EntrepreneurPulse.com.au  Fact Sheet for Healthcare Providers: IncredibleEmployment.be  This test is not yet approved or cleared by the [place] FDA and  has been authorized for detection and/or diagnosis of SARS-CoV-2 by FDA under an Emergency Use Authorization (EUA).  This EUA will remain in effect (meaning this test can be used) for the duration of  the COVID-19 declaration under Section 564(b)(1) of the A ct, 21 U.S.C. section 360bbb-3(b)(1), unless the authorization is terminated or revoked sooner.     Influenza A by PCR NEGATIVE NEGATIVE Final   Influenza B by PCR NEGATIVE NEGATIVE Final    Comment: (NOTE) The Xpert Xpress SARS-CoV-2/FLU/RSV plus assay is intended as an aid in the diagnosis of influenza from Nasopharyngeal swab specimens and should not be used as a sole basis for treatment. Nasal washings and aspirates are unacceptable for Xpert Xpress  SARS-CoV-2/FLU/RSV testing.  Fact Sheet for Patients: EntrepreneurPulse.com.au  Fact Sheet for Healthcare Providers: IncredibleEmployment.be  This test is not yet approved or cleared by the [place] FDA and has been authorized for detection and/or diagnosis of SARS-CoV-2 by FDA under an Emergency Use Authorization (EUA). This EUA will remain in effect (meaning this test can be used) for the duration of the COVID-19 declaration under Section 564(b)(1) of the Act, 21 U.S.C. section 360bbb-3(b)(1), unless the authorization is terminated or revoked.  Performed at [facility], [place] [address] [place], [place] [address]   Blood Culture (routine x 2)     Status: None (Preliminary result)   Collection Time: 08/25/21 10:36 PM   Specimen: BLOOD RIGHT HAND  Result Value Ref Range Status   Specimen Description BLOOD RIGHT HAND  Final   Special Requests   Final    BOTTLES DRAWN AEROBIC AND ANAEROBIC Blood Culture results may not  be optimal due to an inadequate volume of blood received in culture bottles   Culture   Final    NO GROWTH 3 DAYS Performed at [facility], [place] [address] [place], [place] [address]    Report Status PENDING  Incomplete  Urine Culture     Status: Abnormal   Collection Time: 08/25/21 11:09 PM   Specimen: In/Out Cath Urine  Result Value Ref Range Status   Specimen Description IN/OUT CATH URINE  Final   Special Requests   Final    NONE Performed at [facility], [place] [address] [place], [place] [address]    Culture >=100,000 COLONIES/mL ESCHERICHIA COLI (A)  Final   Report Status 08/28/2021 FINAL  Final   Organism ID, Bacteria ESCHERICHIA COLI (A)  Final      Susceptibility   Escherichia coli - MIC*    AMPICILLIN >=32 RESISTANT Resistant     CEFAZOLIN <=4 SENSITIVE Sensitive     CEFEPIME <=0.12 SENSITIVE Sensitive     CEFTRIAXONE <=0.25 SENSITIVE Sensitive     CIPROFLOXACIN <=0.25 SENSITIVE  Sensitive     GENTAMICIN >=16 RESISTANT Resistant     IMIPENEM <=0.25 SENSITIVE Sensitive     NITROFURANTOIN <=16 SENSITIVE Sensitive     TRIMETH/SULFA >=320 RESISTANT Resistant     AMPICILLIN/SULBACTAM >=32 RESISTANT Resistant     PIP/TAZO <=4 SENSITIVE Sensitive     * >=100,000 COLONIES/mL ESCHERICHIA COLI     Time coordinating discharge: 40 minutes  SIGNED:   Elmarie Shiley, MD  [facility]

## 2021-08-29 NOTE — TOC Progression Note (Signed)
Transition of Care [facility]) - Initial/Assessment Note    Patient Details  Name: Kayla [name] MRN: [id number] Date of Birth: 1940/09/01  [facility] CM/SW Contact:    Milinda Antis, [place] Phone Number: 08/29/2021, 9:03 AM  Clinical Narrative:                  CSW attempted to contact Star with admissions at [facility] to inquire about whether the patient can return to the facility when medically ready.  There was no answer.  CSW left a VM requesting a returned call.         Patient Goals and CMS Choice        Expected Discharge Plan and Services                                                Prior Living Arrangements/Services                       Activities of Daily Living Home Assistive Devices/Equipment: Gilford Rile (specify type) ADL Screening (condition at time of admission) Patient's cognitive ability adequate to safely complete daily activities?: Yes Is the patient deaf or have difficulty hearing?: Yes Does the patient have difficulty seeing, even when wearing glasses/contacts?: No Does the patient have difficulty concentrating, remembering, or making decisions?: Yes Patient able to express need for assistance with ADLs?: Yes Does the patient have difficulty dressing or bathing?: Yes Independently performs ADLs?: No Does the patient have difficulty walking or climbing stairs?: Yes Weakness of Legs: Both Weakness of Arms/Hands: Left  Permission Sought/Granted                  Emotional Assessment              Admission diagnosis:  UTI (urinary tract infection) [N39.0] Patient Active Problem List   Diagnosis Date Noted   Acute cystitis without hematuria 08/26/2021   COVID-19 virus infection 08/26/2021   Facial droop 08/26/2021   Pressure ulcer of left heel 19/50/9326   Acute metabolic encephalopathy 71/24/5809   Unspecified atrial fibrillation ([place]) 06/24/2021   Spinal stenosis of lumbar region at multiple levels  06/24/2021   Lumbar nerve root impingement 06/24/2021   Generalized weakness 06/23/2021   Ambulatory dysfunction 06/23/2021   Spinal stenosis 06/23/2021   Atrial flutter ([place]) 03/06/2021   Atrial flutter with rapid ventricular response ([place]) 03/05/2021   Type 2 diabetes mellitus ([place]) 03/05/2021   Elevated troponin    Anemia due to chronic blood loss 08/30/2020   Constipation 08/30/2020   Feces contents abnormal 08/30/2020   Hemorrhoids 08/30/2020   Proctalgia 08/30/2020   Rectal bleeding 08/30/2020   Lymphedema 12/23/2019   Arthritis 12/18/2018   Fatigue 07/09/2018   Gastroesophageal reflux disease without esophagitis 04/16/2018   Dependent edema 01/01/2017   Anemia of chronic illness 02/15/2015   Multiple falls 02/15/2015   Diabetic neuropathy, type II diabetes mellitus ([place]) 09/09/2012   Hyperlipidemia associated with type 2 diabetes mellitus ([place]) 05/22/2011   Primary hypertension 05/22/2011   Class 2 obesity due to excess calories with body mass index (BMI) of 37.0 to 37.9 in adult 12/14/2010   History of pulmonary embolus (PE) 12/14/2010   PCP:  Denita Lung, MD Pharmacy:   [id number] - [place], [address] [place]  [place] [address] Phone: [phone] Fax: [place], [place] [address] [place] [address] Phone: [phone] Fax: [phone]     Social Determinants of Health (SDOH) Interventions    Readmission Risk Interventions No flowsheet data found.

## 2021-08-29 NOTE — Discharge Instructions (Signed)

## 2021-08-29 NOTE — Progress Notes (Signed)
Ok to optimize ceftriaxone to PO keflex to complete 5d of total abx for UTI per Dr. Nestor Ramp.  Onnie Boer, PharmD, BCIDP, AAHIVP, CPP Infectious Disease Pharmacist 08/29/2021 8:30 AM

## 2021-08-30 DIAGNOSIS — M5416 Radiculopathy, lumbar region: Secondary | ICD-10-CM | POA: Diagnosis not present

## 2021-08-30 DIAGNOSIS — B9620 Unspecified Escherichia coli [E. coli] as the cause of diseases classified elsewhere: Secondary | ICD-10-CM | POA: Diagnosis not present

## 2021-08-30 DIAGNOSIS — U071 COVID-19: Secondary | ICD-10-CM | POA: Diagnosis not present

## 2021-08-30 DIAGNOSIS — I4891 Unspecified atrial fibrillation: Secondary | ICD-10-CM | POA: Diagnosis not present

## 2021-08-30 DIAGNOSIS — G9341 Metabolic encephalopathy: Secondary | ICD-10-CM | POA: Diagnosis not present

## 2021-08-30 DIAGNOSIS — R5381 Other malaise: Secondary | ICD-10-CM | POA: Diagnosis not present

## 2021-08-30 DIAGNOSIS — M48061 Spinal stenosis, lumbar region without neurogenic claudication: Secondary | ICD-10-CM | POA: Diagnosis not present

## 2021-08-30 DIAGNOSIS — N390 Urinary tract infection, site not specified: Secondary | ICD-10-CM | POA: Diagnosis not present

## 2021-08-30 DIAGNOSIS — Q670 Congenital facial asymmetry: Secondary | ICD-10-CM | POA: Diagnosis not present

## 2021-08-30 LAB — CULTURE, BLOOD (ROUTINE X 2)
Culture: NO GROWTH
Culture: NO GROWTH

## 2021-09-05 DIAGNOSIS — E118 Type 2 diabetes mellitus with unspecified complications: Secondary | ICD-10-CM | POA: Diagnosis not present

## 2021-09-05 DIAGNOSIS — E11621 Type 2 diabetes mellitus with foot ulcer: Secondary | ICD-10-CM | POA: Diagnosis not present

## 2021-09-05 DIAGNOSIS — E6609 Other obesity due to excess calories: Secondary | ICD-10-CM | POA: Diagnosis not present

## 2021-09-05 DIAGNOSIS — U071 COVID-19: Secondary | ICD-10-CM | POA: Diagnosis not present

## 2021-09-05 DIAGNOSIS — M6281 Muscle weakness (generalized): Secondary | ICD-10-CM | POA: Diagnosis not present

## 2021-09-07 DIAGNOSIS — E118 Type 2 diabetes mellitus with unspecified complications: Secondary | ICD-10-CM | POA: Diagnosis not present

## 2021-09-07 DIAGNOSIS — E11621 Type 2 diabetes mellitus with foot ulcer: Secondary | ICD-10-CM | POA: Diagnosis not present

## 2021-09-07 DIAGNOSIS — M79601 Pain in right arm: Secondary | ICD-10-CM | POA: Diagnosis not present

## 2021-09-07 DIAGNOSIS — U071 COVID-19: Secondary | ICD-10-CM | POA: Diagnosis not present

## 2021-09-07 DIAGNOSIS — E6609 Other obesity due to excess calories: Secondary | ICD-10-CM | POA: Diagnosis not present

## 2021-09-07 DIAGNOSIS — M6281 Muscle weakness (generalized): Secondary | ICD-10-CM | POA: Diagnosis not present

## 2021-09-07 DIAGNOSIS — J069 Acute upper respiratory infection, unspecified: Secondary | ICD-10-CM | POA: Diagnosis not present

## 2021-09-10 DIAGNOSIS — M48061 Spinal stenosis, lumbar region without neurogenic claudication: Secondary | ICD-10-CM | POA: Diagnosis not present

## 2021-09-10 DIAGNOSIS — R2689 Other abnormalities of gait and mobility: Secondary | ICD-10-CM | POA: Diagnosis not present

## 2021-09-10 DIAGNOSIS — R262 Difficulty in walking, not elsewhere classified: Secondary | ICD-10-CM | POA: Diagnosis not present

## 2021-09-10 DIAGNOSIS — M6281 Muscle weakness (generalized): Secondary | ICD-10-CM | POA: Diagnosis not present

## 2021-09-10 DIAGNOSIS — G9341 Metabolic encephalopathy: Secondary | ICD-10-CM | POA: Diagnosis not present

## 2021-09-12 DIAGNOSIS — E6609 Other obesity due to excess calories: Secondary | ICD-10-CM | POA: Diagnosis not present

## 2021-09-12 DIAGNOSIS — U071 COVID-19: Secondary | ICD-10-CM | POA: Diagnosis not present

## 2021-09-12 DIAGNOSIS — K648 Other hemorrhoids: Secondary | ICD-10-CM | POA: Diagnosis not present

## 2021-09-12 DIAGNOSIS — G9341 Metabolic encephalopathy: Secondary | ICD-10-CM | POA: Diagnosis not present

## 2021-09-12 DIAGNOSIS — E118 Type 2 diabetes mellitus with unspecified complications: Secondary | ICD-10-CM | POA: Diagnosis not present

## 2021-09-12 DIAGNOSIS — R262 Difficulty in walking, not elsewhere classified: Secondary | ICD-10-CM | POA: Diagnosis not present

## 2021-09-12 DIAGNOSIS — M6281 Muscle weakness (generalized): Secondary | ICD-10-CM | POA: Diagnosis not present

## 2021-09-12 DIAGNOSIS — E11621 Type 2 diabetes mellitus with foot ulcer: Secondary | ICD-10-CM | POA: Diagnosis not present

## 2021-09-12 DIAGNOSIS — M48061 Spinal stenosis, lumbar region without neurogenic claudication: Secondary | ICD-10-CM | POA: Diagnosis not present

## 2021-09-12 DIAGNOSIS — R2689 Other abnormalities of gait and mobility: Secondary | ICD-10-CM | POA: Diagnosis not present

## 2021-09-13 DIAGNOSIS — L97422 Non-pressure chronic ulcer of left heel and midfoot with fat layer exposed: Secondary | ICD-10-CM | POA: Diagnosis not present

## 2021-09-15 DIAGNOSIS — M48061 Spinal stenosis, lumbar region without neurogenic claudication: Secondary | ICD-10-CM | POA: Diagnosis not present

## 2021-09-15 DIAGNOSIS — R262 Difficulty in walking, not elsewhere classified: Secondary | ICD-10-CM | POA: Diagnosis not present

## 2021-09-15 DIAGNOSIS — M6281 Muscle weakness (generalized): Secondary | ICD-10-CM | POA: Diagnosis not present

## 2021-09-15 DIAGNOSIS — G9341 Metabolic encephalopathy: Secondary | ICD-10-CM | POA: Diagnosis not present

## 2021-09-15 DIAGNOSIS — R2689 Other abnormalities of gait and mobility: Secondary | ICD-10-CM | POA: Diagnosis not present

## 2021-09-17 DIAGNOSIS — M48061 Spinal stenosis, lumbar region without neurogenic claudication: Secondary | ICD-10-CM | POA: Diagnosis not present

## 2021-09-17 DIAGNOSIS — M6281 Muscle weakness (generalized): Secondary | ICD-10-CM | POA: Diagnosis not present

## 2021-09-17 DIAGNOSIS — R262 Difficulty in walking, not elsewhere classified: Secondary | ICD-10-CM | POA: Diagnosis not present

## 2021-09-17 DIAGNOSIS — R2689 Other abnormalities of gait and mobility: Secondary | ICD-10-CM | POA: Diagnosis not present

## 2021-09-17 DIAGNOSIS — G9341 Metabolic encephalopathy: Secondary | ICD-10-CM | POA: Diagnosis not present

## 2021-09-19 DIAGNOSIS — R262 Difficulty in walking, not elsewhere classified: Secondary | ICD-10-CM | POA: Diagnosis not present

## 2021-09-19 DIAGNOSIS — R2689 Other abnormalities of gait and mobility: Secondary | ICD-10-CM | POA: Diagnosis not present

## 2021-09-19 DIAGNOSIS — G9341 Metabolic encephalopathy: Secondary | ICD-10-CM | POA: Diagnosis not present

## 2021-09-19 DIAGNOSIS — M48061 Spinal stenosis, lumbar region without neurogenic claudication: Secondary | ICD-10-CM | POA: Diagnosis not present

## 2021-09-19 DIAGNOSIS — M6281 Muscle weakness (generalized): Secondary | ICD-10-CM | POA: Diagnosis not present

## 2021-09-20 DIAGNOSIS — L97422 Non-pressure chronic ulcer of left heel and midfoot with fat layer exposed: Secondary | ICD-10-CM | POA: Diagnosis not present

## 2021-09-20 DIAGNOSIS — N390 Urinary tract infection, site not specified: Secondary | ICD-10-CM | POA: Diagnosis not present

## 2021-09-20 DIAGNOSIS — M48061 Spinal stenosis, lumbar region without neurogenic claudication: Secondary | ICD-10-CM | POA: Diagnosis not present

## 2021-09-20 DIAGNOSIS — M79605 Pain in left leg: Secondary | ICD-10-CM | POA: Diagnosis not present

## 2021-09-20 DIAGNOSIS — I4891 Unspecified atrial fibrillation: Secondary | ICD-10-CM | POA: Diagnosis not present

## 2021-09-20 DIAGNOSIS — D638 Anemia in other chronic diseases classified elsewhere: Secondary | ICD-10-CM | POA: Diagnosis not present

## 2021-09-20 DIAGNOSIS — Z6838 Body mass index (BMI) 38.0-38.9, adult: Secondary | ICD-10-CM | POA: Diagnosis not present

## 2021-09-20 DIAGNOSIS — E11621 Type 2 diabetes mellitus with foot ulcer: Secondary | ICD-10-CM | POA: Diagnosis not present

## 2021-09-21 DIAGNOSIS — R262 Difficulty in walking, not elsewhere classified: Secondary | ICD-10-CM | POA: Diagnosis not present

## 2021-09-21 DIAGNOSIS — R2689 Other abnormalities of gait and mobility: Secondary | ICD-10-CM | POA: Diagnosis not present

## 2021-09-21 DIAGNOSIS — M48061 Spinal stenosis, lumbar region without neurogenic claudication: Secondary | ICD-10-CM | POA: Diagnosis not present

## 2021-09-21 DIAGNOSIS — M6281 Muscle weakness (generalized): Secondary | ICD-10-CM | POA: Diagnosis not present

## 2021-09-21 DIAGNOSIS — G9341 Metabolic encephalopathy: Secondary | ICD-10-CM | POA: Diagnosis not present

## 2021-09-21 DIAGNOSIS — R52 Pain, unspecified: Secondary | ICD-10-CM | POA: Diagnosis not present

## 2021-09-22 DIAGNOSIS — R2689 Other abnormalities of gait and mobility: Secondary | ICD-10-CM | POA: Diagnosis not present

## 2021-09-22 DIAGNOSIS — U071 COVID-19: Secondary | ICD-10-CM | POA: Diagnosis not present

## 2021-09-22 DIAGNOSIS — M79605 Pain in left leg: Secondary | ICD-10-CM | POA: Diagnosis not present

## 2021-09-22 DIAGNOSIS — M6281 Muscle weakness (generalized): Secondary | ICD-10-CM | POA: Diagnosis not present

## 2021-09-22 DIAGNOSIS — I152 Hypertension secondary to endocrine disorders: Secondary | ICD-10-CM | POA: Diagnosis not present

## 2021-09-22 DIAGNOSIS — M48061 Spinal stenosis, lumbar region without neurogenic claudication: Secondary | ICD-10-CM | POA: Diagnosis not present

## 2021-09-22 DIAGNOSIS — G9341 Metabolic encephalopathy: Secondary | ICD-10-CM | POA: Diagnosis not present

## 2021-09-22 DIAGNOSIS — R262 Difficulty in walking, not elsewhere classified: Secondary | ICD-10-CM | POA: Diagnosis not present

## 2021-09-22 DIAGNOSIS — E11621 Type 2 diabetes mellitus with foot ulcer: Secondary | ICD-10-CM | POA: Diagnosis not present

## 2021-09-23 DIAGNOSIS — R2689 Other abnormalities of gait and mobility: Secondary | ICD-10-CM | POA: Diagnosis not present

## 2021-09-23 DIAGNOSIS — R262 Difficulty in walking, not elsewhere classified: Secondary | ICD-10-CM | POA: Diagnosis not present

## 2021-09-23 DIAGNOSIS — M48061 Spinal stenosis, lumbar region without neurogenic claudication: Secondary | ICD-10-CM | POA: Diagnosis not present

## 2021-09-23 DIAGNOSIS — G9341 Metabolic encephalopathy: Secondary | ICD-10-CM | POA: Diagnosis not present

## 2021-09-23 DIAGNOSIS — M6281 Muscle weakness (generalized): Secondary | ICD-10-CM | POA: Diagnosis not present

## 2021-09-24 DIAGNOSIS — M6281 Muscle weakness (generalized): Secondary | ICD-10-CM | POA: Diagnosis not present

## 2021-09-24 DIAGNOSIS — R2689 Other abnormalities of gait and mobility: Secondary | ICD-10-CM | POA: Diagnosis not present

## 2021-09-24 DIAGNOSIS — R262 Difficulty in walking, not elsewhere classified: Secondary | ICD-10-CM | POA: Diagnosis not present

## 2021-09-24 DIAGNOSIS — M48061 Spinal stenosis, lumbar region without neurogenic claudication: Secondary | ICD-10-CM | POA: Diagnosis not present

## 2021-09-24 DIAGNOSIS — G9341 Metabolic encephalopathy: Secondary | ICD-10-CM | POA: Diagnosis not present

## 2021-09-25 DIAGNOSIS — R2689 Other abnormalities of gait and mobility: Secondary | ICD-10-CM | POA: Diagnosis not present

## 2021-09-25 DIAGNOSIS — M6281 Muscle weakness (generalized): Secondary | ICD-10-CM | POA: Diagnosis not present

## 2021-09-25 DIAGNOSIS — M48061 Spinal stenosis, lumbar region without neurogenic claudication: Secondary | ICD-10-CM | POA: Diagnosis not present

## 2021-09-25 DIAGNOSIS — R262 Difficulty in walking, not elsewhere classified: Secondary | ICD-10-CM | POA: Diagnosis not present

## 2021-09-25 DIAGNOSIS — G9341 Metabolic encephalopathy: Secondary | ICD-10-CM | POA: Diagnosis not present

## 2021-09-26 DIAGNOSIS — M5416 Radiculopathy, lumbar region: Secondary | ICD-10-CM | POA: Diagnosis not present

## 2021-09-26 DIAGNOSIS — E118 Type 2 diabetes mellitus with unspecified complications: Secondary | ICD-10-CM | POA: Diagnosis not present

## 2021-09-26 DIAGNOSIS — E6609 Other obesity due to excess calories: Secondary | ICD-10-CM | POA: Diagnosis not present

## 2021-09-26 DIAGNOSIS — U071 COVID-19: Secondary | ICD-10-CM | POA: Diagnosis not present

## 2021-09-26 DIAGNOSIS — G9341 Metabolic encephalopathy: Secondary | ICD-10-CM | POA: Diagnosis not present

## 2021-09-26 DIAGNOSIS — G4700 Insomnia, unspecified: Secondary | ICD-10-CM | POA: Diagnosis not present

## 2021-09-26 DIAGNOSIS — M79605 Pain in left leg: Secondary | ICD-10-CM | POA: Diagnosis not present

## 2021-09-26 DIAGNOSIS — M6281 Muscle weakness (generalized): Secondary | ICD-10-CM | POA: Diagnosis not present

## 2021-09-26 DIAGNOSIS — R262 Difficulty in walking, not elsewhere classified: Secondary | ICD-10-CM | POA: Diagnosis not present

## 2021-09-26 DIAGNOSIS — R2689 Other abnormalities of gait and mobility: Secondary | ICD-10-CM | POA: Diagnosis not present

## 2021-09-26 DIAGNOSIS — M48061 Spinal stenosis, lumbar region without neurogenic claudication: Secondary | ICD-10-CM | POA: Diagnosis not present

## 2021-09-26 DIAGNOSIS — K648 Other hemorrhoids: Secondary | ICD-10-CM | POA: Diagnosis not present

## 2021-09-26 DIAGNOSIS — E11621 Type 2 diabetes mellitus with foot ulcer: Secondary | ICD-10-CM | POA: Diagnosis not present

## 2021-09-27 DIAGNOSIS — G9341 Metabolic encephalopathy: Secondary | ICD-10-CM | POA: Diagnosis not present

## 2021-09-27 DIAGNOSIS — L97422 Non-pressure chronic ulcer of left heel and midfoot with fat layer exposed: Secondary | ICD-10-CM | POA: Diagnosis not present

## 2021-09-27 DIAGNOSIS — R262 Difficulty in walking, not elsewhere classified: Secondary | ICD-10-CM | POA: Diagnosis not present

## 2021-09-27 DIAGNOSIS — M48061 Spinal stenosis, lumbar region without neurogenic claudication: Secondary | ICD-10-CM | POA: Diagnosis not present

## 2021-09-27 DIAGNOSIS — R2689 Other abnormalities of gait and mobility: Secondary | ICD-10-CM | POA: Diagnosis not present

## 2021-09-27 DIAGNOSIS — M6281 Muscle weakness (generalized): Secondary | ICD-10-CM | POA: Diagnosis not present

## 2021-09-27 DIAGNOSIS — M79662 Pain in left lower leg: Secondary | ICD-10-CM | POA: Diagnosis not present

## 2021-09-28 DIAGNOSIS — R262 Difficulty in walking, not elsewhere classified: Secondary | ICD-10-CM | POA: Diagnosis not present

## 2021-09-28 DIAGNOSIS — M7989 Other specified soft tissue disorders: Secondary | ICD-10-CM | POA: Diagnosis not present

## 2021-09-28 DIAGNOSIS — M79605 Pain in left leg: Secondary | ICD-10-CM | POA: Diagnosis not present

## 2021-09-28 DIAGNOSIS — M79641 Pain in right hand: Secondary | ICD-10-CM | POA: Diagnosis not present

## 2021-09-28 DIAGNOSIS — M6281 Muscle weakness (generalized): Secondary | ICD-10-CM | POA: Diagnosis not present

## 2021-09-28 DIAGNOSIS — M25669 Stiffness of unspecified knee, not elsewhere classified: Secondary | ICD-10-CM | POA: Diagnosis not present

## 2021-09-28 DIAGNOSIS — G9341 Metabolic encephalopathy: Secondary | ICD-10-CM | POA: Diagnosis not present

## 2021-09-28 DIAGNOSIS — M48061 Spinal stenosis, lumbar region without neurogenic claudication: Secondary | ICD-10-CM | POA: Diagnosis not present

## 2021-09-28 DIAGNOSIS — R2689 Other abnormalities of gait and mobility: Secondary | ICD-10-CM | POA: Diagnosis not present

## 2021-09-29 DIAGNOSIS — M79641 Pain in right hand: Secondary | ICD-10-CM | POA: Diagnosis not present

## 2021-09-29 DIAGNOSIS — R262 Difficulty in walking, not elsewhere classified: Secondary | ICD-10-CM | POA: Diagnosis not present

## 2021-09-29 DIAGNOSIS — G9341 Metabolic encephalopathy: Secondary | ICD-10-CM | POA: Diagnosis not present

## 2021-09-29 DIAGNOSIS — E11621 Type 2 diabetes mellitus with foot ulcer: Secondary | ICD-10-CM | POA: Diagnosis not present

## 2021-09-29 DIAGNOSIS — M48061 Spinal stenosis, lumbar region without neurogenic claudication: Secondary | ICD-10-CM | POA: Diagnosis not present

## 2021-09-29 DIAGNOSIS — M25669 Stiffness of unspecified knee, not elsewhere classified: Secondary | ICD-10-CM | POA: Diagnosis not present

## 2021-09-29 DIAGNOSIS — M6281 Muscle weakness (generalized): Secondary | ICD-10-CM | POA: Diagnosis not present

## 2021-09-29 DIAGNOSIS — R2689 Other abnormalities of gait and mobility: Secondary | ICD-10-CM | POA: Diagnosis not present

## 2021-09-30 DIAGNOSIS — R2689 Other abnormalities of gait and mobility: Secondary | ICD-10-CM | POA: Diagnosis not present

## 2021-09-30 DIAGNOSIS — M79641 Pain in right hand: Secondary | ICD-10-CM | POA: Diagnosis not present

## 2021-09-30 DIAGNOSIS — R262 Difficulty in walking, not elsewhere classified: Secondary | ICD-10-CM | POA: Diagnosis not present

## 2021-09-30 DIAGNOSIS — G9341 Metabolic encephalopathy: Secondary | ICD-10-CM | POA: Diagnosis not present

## 2021-09-30 DIAGNOSIS — M7989 Other specified soft tissue disorders: Secondary | ICD-10-CM | POA: Diagnosis not present

## 2021-09-30 DIAGNOSIS — M6281 Muscle weakness (generalized): Secondary | ICD-10-CM | POA: Diagnosis not present

## 2021-09-30 DIAGNOSIS — M25669 Stiffness of unspecified knee, not elsewhere classified: Secondary | ICD-10-CM | POA: Diagnosis not present

## 2021-09-30 DIAGNOSIS — M48061 Spinal stenosis, lumbar region without neurogenic claudication: Secondary | ICD-10-CM | POA: Diagnosis not present

## 2021-09-30 DIAGNOSIS — N390 Urinary tract infection, site not specified: Secondary | ICD-10-CM | POA: Diagnosis not present

## 2021-10-01 DIAGNOSIS — G9341 Metabolic encephalopathy: Secondary | ICD-10-CM | POA: Diagnosis not present

## 2021-10-01 DIAGNOSIS — E119 Type 2 diabetes mellitus without complications: Secondary | ICD-10-CM | POA: Diagnosis not present

## 2021-10-01 DIAGNOSIS — Z86711 Personal history of pulmonary embolism: Secondary | ICD-10-CM | POA: Diagnosis not present

## 2021-10-03 DIAGNOSIS — N390 Urinary tract infection, site not specified: Secondary | ICD-10-CM | POA: Diagnosis not present

## 2021-10-03 DIAGNOSIS — I10 Essential (primary) hypertension: Secondary | ICD-10-CM | POA: Diagnosis not present

## 2021-10-03 DIAGNOSIS — M48061 Spinal stenosis, lumbar region without neurogenic claudication: Secondary | ICD-10-CM | POA: Diagnosis not present

## 2021-10-03 DIAGNOSIS — M25669 Stiffness of unspecified knee, not elsewhere classified: Secondary | ICD-10-CM | POA: Diagnosis not present

## 2021-10-03 DIAGNOSIS — G9341 Metabolic encephalopathy: Secondary | ICD-10-CM | POA: Diagnosis not present

## 2021-10-03 DIAGNOSIS — R262 Difficulty in walking, not elsewhere classified: Secondary | ICD-10-CM | POA: Diagnosis not present

## 2021-10-03 DIAGNOSIS — R2689 Other abnormalities of gait and mobility: Secondary | ICD-10-CM | POA: Diagnosis not present

## 2021-10-03 DIAGNOSIS — M6281 Muscle weakness (generalized): Secondary | ICD-10-CM | POA: Diagnosis not present

## 2021-10-04 DIAGNOSIS — M6281 Muscle weakness (generalized): Secondary | ICD-10-CM | POA: Diagnosis not present

## 2021-10-04 DIAGNOSIS — R2689 Other abnormalities of gait and mobility: Secondary | ICD-10-CM | POA: Diagnosis not present

## 2021-10-04 DIAGNOSIS — G9341 Metabolic encephalopathy: Secondary | ICD-10-CM | POA: Diagnosis not present

## 2021-10-04 DIAGNOSIS — R262 Difficulty in walking, not elsewhere classified: Secondary | ICD-10-CM | POA: Diagnosis not present

## 2021-10-04 DIAGNOSIS — M48061 Spinal stenosis, lumbar region without neurogenic claudication: Secondary | ICD-10-CM | POA: Diagnosis not present

## 2021-10-04 DIAGNOSIS — L97422 Non-pressure chronic ulcer of left heel and midfoot with fat layer exposed: Secondary | ICD-10-CM | POA: Diagnosis not present

## 2021-10-04 DIAGNOSIS — M25669 Stiffness of unspecified knee, not elsewhere classified: Secondary | ICD-10-CM | POA: Diagnosis not present

## 2021-10-05 DIAGNOSIS — G9341 Metabolic encephalopathy: Secondary | ICD-10-CM | POA: Diagnosis not present

## 2021-10-05 DIAGNOSIS — M48061 Spinal stenosis, lumbar region without neurogenic claudication: Secondary | ICD-10-CM | POA: Diagnosis not present

## 2021-10-05 DIAGNOSIS — M25669 Stiffness of unspecified knee, not elsewhere classified: Secondary | ICD-10-CM | POA: Diagnosis not present

## 2021-10-05 DIAGNOSIS — M6281 Muscle weakness (generalized): Secondary | ICD-10-CM | POA: Diagnosis not present

## 2021-10-05 DIAGNOSIS — R2689 Other abnormalities of gait and mobility: Secondary | ICD-10-CM | POA: Diagnosis not present

## 2021-10-05 DIAGNOSIS — R262 Difficulty in walking, not elsewhere classified: Secondary | ICD-10-CM | POA: Diagnosis not present

## 2021-10-06 DIAGNOSIS — M48061 Spinal stenosis, lumbar region without neurogenic claudication: Secondary | ICD-10-CM | POA: Diagnosis not present

## 2021-10-06 DIAGNOSIS — G9341 Metabolic encephalopathy: Secondary | ICD-10-CM | POA: Diagnosis not present

## 2021-10-06 DIAGNOSIS — R262 Difficulty in walking, not elsewhere classified: Secondary | ICD-10-CM | POA: Diagnosis not present

## 2021-10-06 DIAGNOSIS — M25669 Stiffness of unspecified knee, not elsewhere classified: Secondary | ICD-10-CM | POA: Diagnosis not present

## 2021-10-06 DIAGNOSIS — D519 Vitamin B12 deficiency anemia, unspecified: Secondary | ICD-10-CM | POA: Diagnosis not present

## 2021-10-06 DIAGNOSIS — Z86711 Personal history of pulmonary embolism: Secondary | ICD-10-CM | POA: Diagnosis not present

## 2021-10-06 DIAGNOSIS — M6281 Muscle weakness (generalized): Secondary | ICD-10-CM | POA: Diagnosis not present

## 2021-10-06 DIAGNOSIS — R2689 Other abnormalities of gait and mobility: Secondary | ICD-10-CM | POA: Diagnosis not present

## 2021-10-07 DIAGNOSIS — M79641 Pain in right hand: Secondary | ICD-10-CM | POA: Diagnosis not present

## 2021-10-07 DIAGNOSIS — M25669 Stiffness of unspecified knee, not elsewhere classified: Secondary | ICD-10-CM | POA: Diagnosis not present

## 2021-10-07 DIAGNOSIS — R2689 Other abnormalities of gait and mobility: Secondary | ICD-10-CM | POA: Diagnosis not present

## 2021-10-07 DIAGNOSIS — R262 Difficulty in walking, not elsewhere classified: Secondary | ICD-10-CM | POA: Diagnosis not present

## 2021-10-07 DIAGNOSIS — M48061 Spinal stenosis, lumbar region without neurogenic claudication: Secondary | ICD-10-CM | POA: Diagnosis not present

## 2021-10-07 DIAGNOSIS — G9341 Metabolic encephalopathy: Secondary | ICD-10-CM | POA: Diagnosis not present

## 2021-10-07 DIAGNOSIS — N390 Urinary tract infection, site not specified: Secondary | ICD-10-CM | POA: Diagnosis not present

## 2021-10-07 DIAGNOSIS — M6281 Muscle weakness (generalized): Secondary | ICD-10-CM | POA: Diagnosis not present

## 2021-10-07 DIAGNOSIS — D649 Anemia, unspecified: Secondary | ICD-10-CM | POA: Diagnosis not present

## 2021-10-07 DIAGNOSIS — F1190 Opioid use, unspecified, uncomplicated: Secondary | ICD-10-CM | POA: Diagnosis not present

## 2021-10-08 DIAGNOSIS — M48061 Spinal stenosis, lumbar region without neurogenic claudication: Secondary | ICD-10-CM | POA: Diagnosis not present

## 2021-10-08 DIAGNOSIS — G9341 Metabolic encephalopathy: Secondary | ICD-10-CM | POA: Diagnosis not present

## 2021-10-08 DIAGNOSIS — M25669 Stiffness of unspecified knee, not elsewhere classified: Secondary | ICD-10-CM | POA: Diagnosis not present

## 2021-10-08 DIAGNOSIS — M6281 Muscle weakness (generalized): Secondary | ICD-10-CM | POA: Diagnosis not present

## 2021-10-08 DIAGNOSIS — R262 Difficulty in walking, not elsewhere classified: Secondary | ICD-10-CM | POA: Diagnosis not present

## 2021-10-08 DIAGNOSIS — R2689 Other abnormalities of gait and mobility: Secondary | ICD-10-CM | POA: Diagnosis not present

## 2021-10-09 DIAGNOSIS — M6281 Muscle weakness (generalized): Secondary | ICD-10-CM | POA: Diagnosis not present

## 2021-10-09 DIAGNOSIS — M25669 Stiffness of unspecified knee, not elsewhere classified: Secondary | ICD-10-CM | POA: Diagnosis not present

## 2021-10-09 DIAGNOSIS — M48061 Spinal stenosis, lumbar region without neurogenic claudication: Secondary | ICD-10-CM | POA: Diagnosis not present

## 2021-10-09 DIAGNOSIS — G9341 Metabolic encephalopathy: Secondary | ICD-10-CM | POA: Diagnosis not present

## 2021-10-09 DIAGNOSIS — R2689 Other abnormalities of gait and mobility: Secondary | ICD-10-CM | POA: Diagnosis not present

## 2021-10-09 DIAGNOSIS — R262 Difficulty in walking, not elsewhere classified: Secondary | ICD-10-CM | POA: Diagnosis not present

## 2021-10-10 DIAGNOSIS — E11621 Type 2 diabetes mellitus with foot ulcer: Secondary | ICD-10-CM | POA: Diagnosis not present

## 2021-10-10 DIAGNOSIS — R2689 Other abnormalities of gait and mobility: Secondary | ICD-10-CM | POA: Diagnosis not present

## 2021-10-10 DIAGNOSIS — U071 COVID-19: Secondary | ICD-10-CM | POA: Diagnosis not present

## 2021-10-10 DIAGNOSIS — M48061 Spinal stenosis, lumbar region without neurogenic claudication: Secondary | ICD-10-CM | POA: Diagnosis not present

## 2021-10-10 DIAGNOSIS — E118 Type 2 diabetes mellitus with unspecified complications: Secondary | ICD-10-CM | POA: Diagnosis not present

## 2021-10-10 DIAGNOSIS — G9341 Metabolic encephalopathy: Secondary | ICD-10-CM | POA: Diagnosis not present

## 2021-10-10 DIAGNOSIS — E6609 Other obesity due to excess calories: Secondary | ICD-10-CM | POA: Diagnosis not present

## 2021-10-10 DIAGNOSIS — M25669 Stiffness of unspecified knee, not elsewhere classified: Secondary | ICD-10-CM | POA: Diagnosis not present

## 2021-10-10 DIAGNOSIS — K648 Other hemorrhoids: Secondary | ICD-10-CM | POA: Diagnosis not present

## 2021-10-10 DIAGNOSIS — R262 Difficulty in walking, not elsewhere classified: Secondary | ICD-10-CM | POA: Diagnosis not present

## 2021-10-10 DIAGNOSIS — M6281 Muscle weakness (generalized): Secondary | ICD-10-CM | POA: Diagnosis not present

## 2021-10-11 DIAGNOSIS — G9341 Metabolic encephalopathy: Secondary | ICD-10-CM | POA: Diagnosis not present

## 2021-10-11 DIAGNOSIS — M25669 Stiffness of unspecified knee, not elsewhere classified: Secondary | ICD-10-CM | POA: Diagnosis not present

## 2021-10-11 DIAGNOSIS — R2689 Other abnormalities of gait and mobility: Secondary | ICD-10-CM | POA: Diagnosis not present

## 2021-10-11 DIAGNOSIS — M48061 Spinal stenosis, lumbar region without neurogenic claudication: Secondary | ICD-10-CM | POA: Diagnosis not present

## 2021-10-11 DIAGNOSIS — R262 Difficulty in walking, not elsewhere classified: Secondary | ICD-10-CM | POA: Diagnosis not present

## 2021-10-11 DIAGNOSIS — L97422 Non-pressure chronic ulcer of left heel and midfoot with fat layer exposed: Secondary | ICD-10-CM | POA: Diagnosis not present

## 2021-10-11 DIAGNOSIS — M6281 Muscle weakness (generalized): Secondary | ICD-10-CM | POA: Diagnosis not present

## 2021-10-12 DIAGNOSIS — M6281 Muscle weakness (generalized): Secondary | ICD-10-CM | POA: Diagnosis not present

## 2021-10-12 DIAGNOSIS — M25669 Stiffness of unspecified knee, not elsewhere classified: Secondary | ICD-10-CM | POA: Diagnosis not present

## 2021-10-12 DIAGNOSIS — G9341 Metabolic encephalopathy: Secondary | ICD-10-CM | POA: Diagnosis not present

## 2021-10-12 DIAGNOSIS — M48061 Spinal stenosis, lumbar region without neurogenic claudication: Secondary | ICD-10-CM | POA: Diagnosis not present

## 2021-10-12 DIAGNOSIS — R262 Difficulty in walking, not elsewhere classified: Secondary | ICD-10-CM | POA: Diagnosis not present

## 2021-10-12 DIAGNOSIS — R2689 Other abnormalities of gait and mobility: Secondary | ICD-10-CM | POA: Diagnosis not present

## 2021-10-13 DIAGNOSIS — R2689 Other abnormalities of gait and mobility: Secondary | ICD-10-CM | POA: Diagnosis not present

## 2021-10-13 DIAGNOSIS — I4891 Unspecified atrial fibrillation: Secondary | ICD-10-CM | POA: Diagnosis not present

## 2021-10-13 DIAGNOSIS — R262 Difficulty in walking, not elsewhere classified: Secondary | ICD-10-CM | POA: Diagnosis not present

## 2021-10-13 DIAGNOSIS — M25669 Stiffness of unspecified knee, not elsewhere classified: Secondary | ICD-10-CM | POA: Diagnosis not present

## 2021-10-13 DIAGNOSIS — I152 Hypertension secondary to endocrine disorders: Secondary | ICD-10-CM | POA: Diagnosis not present

## 2021-10-13 DIAGNOSIS — R600 Localized edema: Secondary | ICD-10-CM | POA: Diagnosis not present

## 2021-10-13 DIAGNOSIS — G9341 Metabolic encephalopathy: Secondary | ICD-10-CM | POA: Diagnosis not present

## 2021-10-13 DIAGNOSIS — M48061 Spinal stenosis, lumbar region without neurogenic claudication: Secondary | ICD-10-CM | POA: Diagnosis not present

## 2021-10-13 DIAGNOSIS — E11621 Type 2 diabetes mellitus with foot ulcer: Secondary | ICD-10-CM | POA: Diagnosis not present

## 2021-10-13 DIAGNOSIS — M6281 Muscle weakness (generalized): Secondary | ICD-10-CM | POA: Diagnosis not present

## 2021-10-17 DIAGNOSIS — F409 Phobic anxiety disorder, unspecified: Secondary | ICD-10-CM | POA: Diagnosis not present

## 2021-10-17 DIAGNOSIS — R609 Edema, unspecified: Secondary | ICD-10-CM | POA: Diagnosis not present

## 2021-10-17 DIAGNOSIS — I152 Hypertension secondary to endocrine disorders: Secondary | ICD-10-CM | POA: Diagnosis not present

## 2021-10-17 DIAGNOSIS — E11621 Type 2 diabetes mellitus with foot ulcer: Secondary | ICD-10-CM | POA: Diagnosis not present

## 2021-10-18 DIAGNOSIS — L97422 Non-pressure chronic ulcer of left heel and midfoot with fat layer exposed: Secondary | ICD-10-CM | POA: Diagnosis not present

## 2021-10-25 DIAGNOSIS — L97422 Non-pressure chronic ulcer of left heel and midfoot with fat layer exposed: Secondary | ICD-10-CM | POA: Diagnosis not present

## 2021-10-26 DIAGNOSIS — E11621 Type 2 diabetes mellitus with foot ulcer: Secondary | ICD-10-CM | POA: Diagnosis not present

## 2021-10-31 DIAGNOSIS — M48061 Spinal stenosis, lumbar region without neurogenic claudication: Secondary | ICD-10-CM | POA: Diagnosis not present

## 2021-10-31 DIAGNOSIS — M7989 Other specified soft tissue disorders: Secondary | ICD-10-CM | POA: Diagnosis not present

## 2021-10-31 DIAGNOSIS — M5416 Radiculopathy, lumbar region: Secondary | ICD-10-CM | POA: Diagnosis not present

## 2021-10-31 DIAGNOSIS — M79601 Pain in right arm: Secondary | ICD-10-CM | POA: Diagnosis not present

## 2021-11-01 DIAGNOSIS — R519 Headache, unspecified: Secondary | ICD-10-CM | POA: Diagnosis not present

## 2021-11-01 DIAGNOSIS — E11621 Type 2 diabetes mellitus with foot ulcer: Secondary | ICD-10-CM | POA: Diagnosis not present

## 2021-11-01 DIAGNOSIS — R5383 Other fatigue: Secondary | ICD-10-CM | POA: Diagnosis not present

## 2021-11-01 DIAGNOSIS — N3000 Acute cystitis without hematuria: Secondary | ICD-10-CM | POA: Diagnosis not present

## 2021-11-01 DIAGNOSIS — Z7189 Other specified counseling: Secondary | ICD-10-CM | POA: Diagnosis not present

## 2021-11-01 DIAGNOSIS — L97422 Non-pressure chronic ulcer of left heel and midfoot with fat layer exposed: Secondary | ICD-10-CM | POA: Diagnosis not present

## 2021-11-01 DIAGNOSIS — I4891 Unspecified atrial fibrillation: Secondary | ICD-10-CM | POA: Diagnosis not present

## 2021-11-01 DIAGNOSIS — I152 Hypertension secondary to endocrine disorders: Secondary | ICD-10-CM | POA: Diagnosis not present

## 2021-11-02 ENCOUNTER — Emergency Department (HOSPITAL_COMMUNITY)
Admission: EM | Admit: 2021-11-02 | Discharge: 2021-11-03 | Disposition: A | Discharge status: Home / Self Care | Payer: Medicare PPO | Attending: Emergency Medicine | Admitting: Emergency Medicine

## 2021-11-02 ENCOUNTER — Other Ambulatory Visit: Payer: Self-pay

## 2021-11-02 ENCOUNTER — Emergency Department (HOSPITAL_COMMUNITY): Payer: Medicare PPO

## 2021-11-02 DIAGNOSIS — Z79899 Other long term (current) drug therapy: Secondary | ICD-10-CM | POA: Insufficient documentation

## 2021-11-02 DIAGNOSIS — D72829 Elevated white blood cell count, unspecified: Secondary | ICD-10-CM | POA: Diagnosis not present

## 2021-11-02 DIAGNOSIS — Z7901 Long term (current) use of anticoagulants: Secondary | ICD-10-CM | POA: Diagnosis not present

## 2021-11-02 DIAGNOSIS — M79641 Pain in right hand: Secondary | ICD-10-CM | POA: Diagnosis not present

## 2021-11-02 DIAGNOSIS — I4892 Unspecified atrial flutter: Secondary | ICD-10-CM | POA: Insufficient documentation

## 2021-11-02 DIAGNOSIS — M6281 Muscle weakness (generalized): Secondary | ICD-10-CM | POA: Diagnosis not present

## 2021-11-02 DIAGNOSIS — R4182 Altered mental status, unspecified: Secondary | ICD-10-CM | POA: Insufficient documentation

## 2021-11-02 DIAGNOSIS — N3000 Acute cystitis without hematuria: Secondary | ICD-10-CM

## 2021-11-02 DIAGNOSIS — R531 Weakness: Secondary | ICD-10-CM | POA: Diagnosis not present

## 2021-11-02 DIAGNOSIS — Z7984 Long term (current) use of oral hypoglycemic drugs: Secondary | ICD-10-CM | POA: Diagnosis not present

## 2021-11-02 DIAGNOSIS — E860 Dehydration: Secondary | ICD-10-CM | POA: Diagnosis not present

## 2021-11-02 DIAGNOSIS — R8290 Unspecified abnormal findings in urine: Secondary | ICD-10-CM | POA: Diagnosis not present

## 2021-11-02 DIAGNOSIS — Z9181 History of falling: Secondary | ICD-10-CM | POA: Diagnosis not present

## 2021-11-02 DIAGNOSIS — M79646 Pain in unspecified finger(s): Secondary | ICD-10-CM | POA: Diagnosis not present

## 2021-11-02 DIAGNOSIS — E1140 Type 2 diabetes mellitus with diabetic neuropathy, unspecified: Secondary | ICD-10-CM | POA: Diagnosis not present

## 2021-11-02 DIAGNOSIS — F1190 Opioid use, unspecified, uncomplicated: Secondary | ICD-10-CM | POA: Diagnosis not present

## 2021-11-02 DIAGNOSIS — M48061 Spinal stenosis, lumbar region without neurogenic claudication: Secondary | ICD-10-CM | POA: Diagnosis not present

## 2021-11-02 DIAGNOSIS — I959 Hypotension, unspecified: Secondary | ICD-10-CM | POA: Diagnosis not present

## 2021-11-02 DIAGNOSIS — G9341 Metabolic encephalopathy: Secondary | ICD-10-CM | POA: Diagnosis not present

## 2021-11-02 LAB — CBC WITH DIFFERENTIAL/PLATELET
Abs Immature Granulocytes: 0.03 10*3/uL (ref 0.00–0.07)
Basophils Absolute: 0 10*3/uL (ref 0.0–0.1)
Basophils Relative: 0 %
Eosinophils Absolute: 0.1 10*3/uL (ref 0.0–0.5)
Eosinophils Relative: 1 %
HCT: 30 % — ABNORMAL LOW (ref 36.0–46.0)
Hemoglobin: 9.3 g/dL — ABNORMAL LOW (ref 12.0–15.0)
Immature Granulocytes: 0 %
Lymphocytes Relative: 18 %
Lymphs Abs: 1.5 10*3/uL (ref 0.7–4.0)
MCH: 25.3 pg — ABNORMAL LOW (ref 26.0–34.0)
MCHC: 31 g/dL (ref 30.0–36.0)
MCV: 81.7 fL (ref 80.0–100.0)
Monocytes Absolute: 0.7 10*3/uL (ref 0.1–1.0)
Monocytes Relative: 9 %
Neutro Abs: 6.2 10*3/uL (ref 1.7–7.7)
Neutrophils Relative %: 72 %
Platelets: 296 10*3/uL (ref 150–400)
RBC: 3.67 MIL/uL — ABNORMAL LOW (ref 3.87–5.11)
RDW: 14.8 % (ref 11.5–15.5)
WBC: 8.6 10*3/uL (ref 4.0–10.5)
nRBC: 0 % (ref 0.0–0.2)

## 2021-11-02 LAB — URINALYSIS, ROUTINE W REFLEX MICROSCOPIC
Bilirubin Urine: NEGATIVE
Glucose, UA: NEGATIVE mg/dL
Hgb urine dipstick: NEGATIVE
Ketones, ur: NEGATIVE mg/dL
Nitrite: POSITIVE — AB
Protein, ur: 30 mg/dL — AB
Specific Gravity, Urine: 1.013 (ref 1.005–1.030)
WBC, UA: >50 WBC/hpf — ABNORMAL HIGH (ref 0–5)
pH: 5 (ref 5.0–8.0)

## 2021-11-02 LAB — COMPREHENSIVE METABOLIC PANEL
ALT: 13 U/L (ref 0–44)
AST: 19 U/L (ref 15–41)
Albumin: 2.3 g/dL — ABNORMAL LOW (ref 3.5–5.0)
Alkaline Phosphatase: 80 U/L (ref 38–126)
Anion gap: 13 (ref 5–15)
BUN: 62 mg/dL — ABNORMAL HIGH (ref 8–23)
CO2: 22 mmol/L (ref 22–32)
Calcium: 8.9 mg/dL (ref 8.9–10.3)
Chloride: 98 mmol/L (ref 98–111)
Creatinine, Ser: 1.38 mg/dL — ABNORMAL HIGH (ref 0.44–1.00)
GFR, Estimated: 38 mL/min — ABNORMAL LOW (ref >60)
Glucose, Bld: 129 mg/dL — ABNORMAL HIGH (ref 70–99)
Potassium: 4.5 mmol/L (ref 3.5–5.1)
Sodium: 133 mmol/L — ABNORMAL LOW (ref 135–145)
Total Bilirubin: 0.7 mg/dL (ref 0.3–1.2)
Total Protein: 7.3 g/dL (ref 6.5–8.1)

## 2021-11-02 LAB — CBG MONITORING, ED: Glucose-Capillary: 125 mg/dL — ABNORMAL HIGH (ref 70–99)

## 2021-11-02 LAB — AMMONIA: Ammonia: 12 umol/L (ref 9–35)

## 2021-11-02 MED ORDER — LACTATED RINGERS IV BOLUS
1000.0000 mL | Freq: Once | INTRAVENOUS | Status: AC
  Administered 2021-11-02: 1000 mL via INTRAVENOUS

## 2021-11-02 MED ORDER — CEPHALEXIN 500 MG PO CAPS: 500.0000 mg | ORAL_CAPSULE | Freq: Four times a day (QID) | ORAL | 0 refills | Status: DC

## 2021-11-02 NOTE — ED Provider Notes (Signed)
?[place] ?Provider Note ? ? ?CSN: [id number] ?Arrival date & time: 11/02/21  1742 ? ?  ? ?History ? ?Chief Complaint  ?Patient presents with  ? Altered Mental Status  ? ? ?Kayla [name] is a 81 y.o. female. Patient presents to the hospital via ambulance from [facility] with concerns of altered mental status.  Patient is currently alert and oriented to person place and event.  On Saturday the patient was apparently evaluated by a physician in [facility] who was concerned about a possible stroke.  Family at that time refused transport.  Per nursing home records, patient is currently being treated for metabolic encephalopathy.  Patient denies shortness of breath, denies abdominal pain, denies chest pain, denies headache, denies nausea.  Patient endorses some pain in her fingers.  No other complaints from patient.  Past medical history significant for type 2 diabetes mellitus, diabetic neuropathy, GERD, fatigue, anemia due to chronic blood loss, atrial flutter with RVR, generalized weakness, history of facial droop. ? ?HPI ? ?  ? ?Home Medications ?Prior to Admission medications   ?Medication Sig Start Date End Date Taking? Authorizing Provider  ?Accu-Chek Softclix Lancets lancets 1 each by Other route daily. Use as instructed 03/30/20   Denita Lung, MD  ?acetaminophen (TYLENOL) 325 MG tablet Take 650 mg by mouth every 8 (eight) hours as needed for mild pain or moderate pain.    [provider]  ?acetaminophen (TYLENOL) 500 MG tablet Take 500 mg by mouth 2 (two) times daily.    [provider]  ?apixaban (ELIQUIS) 5 MG TABS tablet Take 1 tablet (5 mg total) by mouth 2 (two) times daily. 05/23/21 08/26/21  Denita Lung, MD  ?ascorbic acid (VITAMIN C) 500 MG tablet Take 1 tablet (500 mg total) by mouth daily. 08/30/21   Regalado, Belkys A, MD  ?atorvastatin (LIPITOR) 80 MG tablet Take 1 tablet (80 mg total) by mouth daily. ?Patient taking differently:  Take 80 mg by mouth at bedtime. 01/20/20   Denita Lung, MD  ?benzonatate (TESSALON) 100 MG capsule Take 1 capsule (100 mg total) by mouth 3 (three) times daily as needed for cough. 08/29/21   Regalado, Belkys A, MD  ?carvedilol (COREG) 6.25 MG tablet Take 1 tablet (6.25 mg total) by mouth 2 (two) times daily. 03/31/21   Werner Lean, MD  ?collagenase (SANTYL) ointment Apply 1 application topically daily.    [provider]  ?diclofenac Sodium (VOLTAREN) 1 % GEL Apply 2 g topically 4 (four) times daily. Rub into affected area of foot 2 to 4 times daily ?Patient taking differently: Apply 2 g topically 4 (four) times daily as needed (pain). 03/31/21   Tysinger, Camelia Eng, PA-C  ?docusate sodium (COLACE) 100 MG capsule Take 100 mg by mouth daily.    [provider]  ?ezetimibe (ZETIA) 10 MG tablet Take 1 tablet (10 mg total) by mouth daily. ?Patient taking differently: Take 10 mg by mouth at bedtime. 05/23/21 08/26/21  Denita Lung, MD  ?ferrous gluconate (FERGON) 324 MG tablet Take 324 mg by mouth 2 (two) times daily.    [provider]  ?fluticasone (FLONASE) 50 MCG/ACT nasal spray Place 1 spray into both nostrils daily.    [provider]  ?furosemide (LASIX) 20 MG tablet TAKE 1 TABLET (20 MG TOTAL) BY MOUTH DAILY AS NEEDED (TAKE AS NEEDED FOR SWELLING). ?Patient not taking: Reported on 08/26/2021 07/13/21   Werner Lean, MD  ?gabapentin (NEURONTIN)  100 MG capsule Take 2 capsules (200 mg total) by mouth 2 (two) times daily. 08/29/21   Regalado, Belkys A, MD  ?glucose blood test strip 1 each by Other route as needed for other. Use as instructed ?Patient taking differently: 1 each by Other route daily. Use as instructed 12/31/20   Denita Lung, MD  ?guaiFENesin (MUCINEX) 600 MG 12 hr tablet Take 600 mg by mouth 2 (two) times daily.    [provider]  ?ipratropium-albuterol (DUONEB) 0.5-2.5 (3) MG/3ML SOLN Take 3 mLs by nebulization every 4 (four) hours as  needed. 08/29/21   Regalado, Belkys A, MD  ?lidocaine 4 % dressing Apply 2 patches topically daily.    [provider]  ?loratadine (CLARITIN) 10 MG tablet Take 10 mg by mouth daily.    [provider]  ?losartan (COZAAR) 50 MG tablet Take 1 tablet (50 mg total) by mouth daily. ?Patient taking differently: Take 50 mg by mouth at bedtime. 05/23/21 08/26/21  Denita Lung, MD  ?melatonin 5 MG TABS Take 5 mg by mouth at bedtime as needed (sleep).    [provider]  ?metFORMIN (GLUCOPHAGE) 500 MG tablet Take 1 tablet (500 mg total) by mouth 2 (two) times daily with a meal. 12/28/20   Denita Lung, MD  ?nitroGLYCERIN (NITROSTAT) 0.4 MG SL tablet Take up to 3 tablets ?Patient taking differently: 0.4 mg every 5 (five) minutes as needed for chest pain. 03/31/21   Chandrasekhar, Terisa Starr, MD  ?TRIAMCINOLONE ACETONIDE, TOP, 0.05 % OINT Apply 1 application topically 2 (two) times daily as needed (rough skin on heels). 03/31/21   Tysinger, Camelia Eng, PA-C  ?zinc sulfate 220 (50 Zn) MG capsule Take 1 capsule (220 mg total) by mouth daily. 08/30/21   Regalado, Cassie Freer, MD  ?   ? ?Allergies    ?Patient has no known allergies.   ? ?Review of Systems   ?Review of Systems  ?Constitutional:  Negative for fever.  ?Respiratory:  Negative for shortness of breath.   ?Cardiovascular:  Negative for chest pain.  ?Gastrointestinal:  Negative for abdominal pain.  ?Musculoskeletal:   ?     Vague finger pain  ?Psychiatric/Behavioral:    ?     Reported altered mental status from nursing facility. CAO to person, place, and event  ? ?Physical Exam ?Updated Vital Signs ?Temp 98.6 ?F (37 ?C) (Oral)   Ht '5\' 4"'$  (1.626 m)   Wt 92.1 kg   BMI 34.84 kg/m?  ?Physical Exam ?Vitals and nursing note reviewed.  ?Constitutional:   ?   General: She is not in acute distress. ?   Appearance: She is obese.  ?HENT:  ?   Head: Normocephalic and atraumatic.  ?Eyes:  ?   Conjunctiva/sclera: Conjunctivae normal.  ?Cardiovascular:  ?   Rate and  Rhythm: Normal rate and regular rhythm.  ?   Pulses: Normal pulses.  ?Pulmonary:  ?   Effort: Pulmonary effort is normal.  ?   Breath sounds: Normal breath sounds.  ?Abdominal:  ?   Palpations: Abdomen is soft.  ?   Tenderness: There is no abdominal tenderness.  ?Skin: ?   General: Skin is warm and dry.  ?Neurological:  ?   Mental Status: She is alert and oriented to person, place, and time.  ?   GCS: GCS eye subscore is 4. GCS verbal subscore is 5. GCS motor subscore is 6.  ?   Sensory: Sensation is intact.  ?   Motor: No weakness  or tremor.  ?   Comments: CN II-VII, XI, XII intact  ? ? ?ED Results / Procedures / Treatments   ?Labs ?(all labs ordered are listed, but only abnormal results are displayed) ?Labs Reviewed - No data to display ? ?EKG ?None ? ?Radiology ?No results found. ? ?Procedures ?Procedures  ?\ ? ?Medications Ordered in ED ?Medications - No data to display ? ?ED Course/ Medical Decision Making/ A&P ?  ?                        ?Medical Decision Making ? ?This patient presents to the ED for concern of altered mental status, this involves an extensive number of treatment options, and is a complaint that carries with it a high risk of complications and morbidity.  The differential diagnosis includes but is not limited to metabolic encephalopathy, CVA, hypoglycemia, and others ? ? ?Co morbidities that complicate the patient evaluation ? ?Hx of facial droop, hx of metabolic encephalopathy, Type 2 DM ? ? ?Additional history obtained: ? ?Additional history obtained from EMS ?External records from outside source obtained and reviewed including hospital discharge summary from 08/29/21 ? ? ?Lab Tests: ? ?I Ordered, and personally interpreted labs.  The pertinent results include:  HgB 9.3, CBG 125. ? ? ?Imaging Studies ordered: ? ?I ordered imaging studies including CT head w/o contrast, chest x-ray ?Studies pending completion ? ? ?Cardiac Monitoring: / EKG: ? ?The patient was maintained on a cardiac monitor.   I personally viewed and interpreted the cardiac monitored which showed an underlying rhythm of: sinus rhythm ? ? ?Test / Admission - Considered: ? ?Plan on monitoring lab work and imaging. If lab work and

## 2021-11-02 NOTE — ED Triage Notes (Signed)
Pt BIB EMS due to AMS. Pt last seen normal was Saturday. Dr at [facility] place was concerned for a stroke on Saturday but family refused transport. Pt is being tx for metabolic encephalopathy. VSS. ?

## 2021-11-02 NOTE — ED Notes (Signed)
Patient transported to CT 

## 2021-11-02 NOTE — ED Notes (Signed)
Patient transported to X-ray 

## 2021-11-02 NOTE — Discharge Instructions (Addendum)
You look a little dehydrated.  You may need more hydration potentially even more fluids at the nursing home.  Your urine also showed infection.  A culture has been sent and you will be notified if it does not match up with the antibiotics.  Your creatinine was mildly elevated at 1.3 ?

## 2021-11-03 ENCOUNTER — Telehealth: Payer: Self-pay

## 2021-11-03 DIAGNOSIS — I152 Hypertension secondary to endocrine disorders: Secondary | ICD-10-CM | POA: Diagnosis not present

## 2021-11-03 DIAGNOSIS — E860 Dehydration: Secondary | ICD-10-CM | POA: Diagnosis not present

## 2021-11-03 DIAGNOSIS — R4182 Altered mental status, unspecified: Secondary | ICD-10-CM | POA: Diagnosis not present

## 2021-11-03 DIAGNOSIS — N3000 Acute cystitis without hematuria: Secondary | ICD-10-CM | POA: Diagnosis not present

## 2021-11-03 DIAGNOSIS — Z7401 Bed confinement status: Secondary | ICD-10-CM | POA: Diagnosis not present

## 2021-11-03 DIAGNOSIS — I4891 Unspecified atrial fibrillation: Secondary | ICD-10-CM | POA: Diagnosis not present

## 2021-11-03 NOTE — ED Notes (Signed)
Patient verbalizes understanding of d/c instructions. Opportunities for questions and answers were provided. Pt d/c from ED and transported to [facility] via [place]. ?

## 2021-11-03 NOTE — Telephone Encounter (Signed)
Transition Care Management Follow-up Telephone Call ?Date of discharge and from where: 11/02/21 ?How have you been since you were released from the hospital? Per son pt has been doing well. ?Any questions or concerns? No ? ?Items Reviewed: ?Did the pt receive and understand the discharge instructions provided? Yes  ?Medications obtained and verified? Yes  ?Other? No  ?Any new allergies since your discharge? No  ?Dietary orders reviewed? Yes ?Do you have support at home?  Pt is in a nursing home  for now .  ? Spoke to pt son and he will call back on Monday to get pt set up for a  follow up appointment.  ? ?Elyse Jarvis RMA ? ?

## 2021-11-04 DIAGNOSIS — I10 Essential (primary) hypertension: Secondary | ICD-10-CM | POA: Diagnosis not present

## 2021-11-04 LAB — URINE CULTURE

## 2021-11-07 DIAGNOSIS — R262 Difficulty in walking, not elsewhere classified: Secondary | ICD-10-CM | POA: Diagnosis not present

## 2021-11-07 DIAGNOSIS — M48061 Spinal stenosis, lumbar region without neurogenic claudication: Secondary | ICD-10-CM | POA: Diagnosis not present

## 2021-11-07 DIAGNOSIS — M6281 Muscle weakness (generalized): Secondary | ICD-10-CM | POA: Diagnosis not present

## 2021-11-07 DIAGNOSIS — G9341 Metabolic encephalopathy: Secondary | ICD-10-CM | POA: Diagnosis not present

## 2021-11-07 DIAGNOSIS — R2689 Other abnormalities of gait and mobility: Secondary | ICD-10-CM | POA: Diagnosis not present

## 2021-11-08 DIAGNOSIS — G9341 Metabolic encephalopathy: Secondary | ICD-10-CM | POA: Diagnosis not present

## 2021-11-08 DIAGNOSIS — R2689 Other abnormalities of gait and mobility: Secondary | ICD-10-CM | POA: Diagnosis not present

## 2021-11-08 DIAGNOSIS — M6281 Muscle weakness (generalized): Secondary | ICD-10-CM | POA: Diagnosis not present

## 2021-11-08 DIAGNOSIS — R262 Difficulty in walking, not elsewhere classified: Secondary | ICD-10-CM | POA: Diagnosis not present

## 2021-11-08 DIAGNOSIS — M48061 Spinal stenosis, lumbar region without neurogenic claudication: Secondary | ICD-10-CM | POA: Diagnosis not present

## 2021-11-09 DIAGNOSIS — E118 Type 2 diabetes mellitus with unspecified complications: Secondary | ICD-10-CM | POA: Diagnosis not present

## 2021-11-09 DIAGNOSIS — E11621 Type 2 diabetes mellitus with foot ulcer: Secondary | ICD-10-CM | POA: Diagnosis not present

## 2021-11-09 DIAGNOSIS — G9341 Metabolic encephalopathy: Secondary | ICD-10-CM | POA: Diagnosis not present

## 2021-11-09 DIAGNOSIS — E6609 Other obesity due to excess calories: Secondary | ICD-10-CM | POA: Diagnosis not present

## 2021-11-09 DIAGNOSIS — K648 Other hemorrhoids: Secondary | ICD-10-CM | POA: Diagnosis not present

## 2021-11-09 DIAGNOSIS — M48061 Spinal stenosis, lumbar region without neurogenic claudication: Secondary | ICD-10-CM | POA: Diagnosis not present

## 2021-11-09 DIAGNOSIS — R2689 Other abnormalities of gait and mobility: Secondary | ICD-10-CM | POA: Diagnosis not present

## 2021-11-09 DIAGNOSIS — M6281 Muscle weakness (generalized): Secondary | ICD-10-CM | POA: Diagnosis not present

## 2021-11-09 DIAGNOSIS — U071 COVID-19: Secondary | ICD-10-CM | POA: Diagnosis not present

## 2021-11-09 DIAGNOSIS — R262 Difficulty in walking, not elsewhere classified: Secondary | ICD-10-CM | POA: Diagnosis not present

## 2021-11-10 DIAGNOSIS — M6281 Muscle weakness (generalized): Secondary | ICD-10-CM | POA: Diagnosis not present

## 2021-11-10 DIAGNOSIS — M48061 Spinal stenosis, lumbar region without neurogenic claudication: Secondary | ICD-10-CM | POA: Diagnosis not present

## 2021-11-10 DIAGNOSIS — G9341 Metabolic encephalopathy: Secondary | ICD-10-CM | POA: Diagnosis not present

## 2021-11-10 DIAGNOSIS — R262 Difficulty in walking, not elsewhere classified: Secondary | ICD-10-CM | POA: Diagnosis not present

## 2021-11-10 DIAGNOSIS — R2689 Other abnormalities of gait and mobility: Secondary | ICD-10-CM | POA: Diagnosis not present

## 2021-11-11 DIAGNOSIS — R2689 Other abnormalities of gait and mobility: Secondary | ICD-10-CM | POA: Diagnosis not present

## 2021-11-11 DIAGNOSIS — G9341 Metabolic encephalopathy: Secondary | ICD-10-CM | POA: Diagnosis not present

## 2021-11-11 DIAGNOSIS — R262 Difficulty in walking, not elsewhere classified: Secondary | ICD-10-CM | POA: Diagnosis not present

## 2021-11-11 DIAGNOSIS — M48061 Spinal stenosis, lumbar region without neurogenic claudication: Secondary | ICD-10-CM | POA: Diagnosis not present

## 2021-11-11 DIAGNOSIS — D649 Anemia, unspecified: Secondary | ICD-10-CM | POA: Diagnosis not present

## 2021-11-11 DIAGNOSIS — M79601 Pain in right arm: Secondary | ICD-10-CM | POA: Diagnosis not present

## 2021-11-11 DIAGNOSIS — M6281 Muscle weakness (generalized): Secondary | ICD-10-CM | POA: Diagnosis not present

## 2021-11-11 DIAGNOSIS — F40232 Fear of other medical care: Secondary | ICD-10-CM | POA: Diagnosis not present

## 2021-11-14 DIAGNOSIS — E11621 Type 2 diabetes mellitus with foot ulcer: Secondary | ICD-10-CM | POA: Diagnosis not present

## 2021-11-14 DIAGNOSIS — M6281 Muscle weakness (generalized): Secondary | ICD-10-CM | POA: Diagnosis not present

## 2021-11-14 DIAGNOSIS — K648 Other hemorrhoids: Secondary | ICD-10-CM | POA: Diagnosis not present

## 2021-11-14 DIAGNOSIS — R262 Difficulty in walking, not elsewhere classified: Secondary | ICD-10-CM | POA: Diagnosis not present

## 2021-11-14 DIAGNOSIS — G9341 Metabolic encephalopathy: Secondary | ICD-10-CM | POA: Diagnosis not present

## 2021-11-14 DIAGNOSIS — E6609 Other obesity due to excess calories: Secondary | ICD-10-CM | POA: Diagnosis not present

## 2021-11-14 DIAGNOSIS — M48061 Spinal stenosis, lumbar region without neurogenic claudication: Secondary | ICD-10-CM | POA: Diagnosis not present

## 2021-11-14 DIAGNOSIS — U071 COVID-19: Secondary | ICD-10-CM | POA: Diagnosis not present

## 2021-11-14 DIAGNOSIS — R2689 Other abnormalities of gait and mobility: Secondary | ICD-10-CM | POA: Diagnosis not present

## 2021-11-14 DIAGNOSIS — E118 Type 2 diabetes mellitus with unspecified complications: Secondary | ICD-10-CM | POA: Diagnosis not present

## 2021-11-15 DIAGNOSIS — M79605 Pain in left leg: Secondary | ICD-10-CM | POA: Diagnosis not present

## 2021-11-15 DIAGNOSIS — G9341 Metabolic encephalopathy: Secondary | ICD-10-CM | POA: Diagnosis not present

## 2021-11-15 DIAGNOSIS — R262 Difficulty in walking, not elsewhere classified: Secondary | ICD-10-CM | POA: Diagnosis not present

## 2021-11-15 DIAGNOSIS — M79601 Pain in right arm: Secondary | ICD-10-CM | POA: Diagnosis not present

## 2021-11-15 DIAGNOSIS — M6281 Muscle weakness (generalized): Secondary | ICD-10-CM | POA: Diagnosis not present

## 2021-11-15 DIAGNOSIS — M48061 Spinal stenosis, lumbar region without neurogenic claudication: Secondary | ICD-10-CM | POA: Diagnosis not present

## 2021-11-15 DIAGNOSIS — R2689 Other abnormalities of gait and mobility: Secondary | ICD-10-CM | POA: Diagnosis not present

## 2021-11-16 DIAGNOSIS — E6609 Other obesity due to excess calories: Secondary | ICD-10-CM | POA: Diagnosis not present

## 2021-11-16 DIAGNOSIS — E11621 Type 2 diabetes mellitus with foot ulcer: Secondary | ICD-10-CM | POA: Diagnosis not present

## 2021-11-16 DIAGNOSIS — R2689 Other abnormalities of gait and mobility: Secondary | ICD-10-CM | POA: Diagnosis not present

## 2021-11-16 DIAGNOSIS — M6281 Muscle weakness (generalized): Secondary | ICD-10-CM | POA: Diagnosis not present

## 2021-11-16 DIAGNOSIS — R400 Somnolence: Secondary | ICD-10-CM | POA: Diagnosis not present

## 2021-11-16 DIAGNOSIS — E118 Type 2 diabetes mellitus with unspecified complications: Secondary | ICD-10-CM | POA: Diagnosis not present

## 2021-11-16 DIAGNOSIS — R262 Difficulty in walking, not elsewhere classified: Secondary | ICD-10-CM | POA: Diagnosis not present

## 2021-11-16 DIAGNOSIS — G9341 Metabolic encephalopathy: Secondary | ICD-10-CM | POA: Diagnosis not present

## 2021-11-16 DIAGNOSIS — K648 Other hemorrhoids: Secondary | ICD-10-CM | POA: Diagnosis not present

## 2021-11-16 DIAGNOSIS — U071 COVID-19: Secondary | ICD-10-CM | POA: Diagnosis not present

## 2021-11-16 DIAGNOSIS — M79601 Pain in right arm: Secondary | ICD-10-CM | POA: Diagnosis not present

## 2021-11-16 DIAGNOSIS — M5416 Radiculopathy, lumbar region: Secondary | ICD-10-CM | POA: Diagnosis not present

## 2021-11-16 DIAGNOSIS — M48061 Spinal stenosis, lumbar region without neurogenic claudication: Secondary | ICD-10-CM | POA: Diagnosis not present

## 2021-11-17 DIAGNOSIS — R262 Difficulty in walking, not elsewhere classified: Secondary | ICD-10-CM | POA: Diagnosis not present

## 2021-11-17 DIAGNOSIS — G9341 Metabolic encephalopathy: Secondary | ICD-10-CM | POA: Diagnosis not present

## 2021-11-17 DIAGNOSIS — R2689 Other abnormalities of gait and mobility: Secondary | ICD-10-CM | POA: Diagnosis not present

## 2021-11-17 DIAGNOSIS — M48061 Spinal stenosis, lumbar region without neurogenic claudication: Secondary | ICD-10-CM | POA: Diagnosis not present

## 2021-11-17 DIAGNOSIS — M6281 Muscle weakness (generalized): Secondary | ICD-10-CM | POA: Diagnosis not present

## 2021-11-18 DIAGNOSIS — R400 Somnolence: Secondary | ICD-10-CM | POA: Diagnosis not present

## 2021-11-18 DIAGNOSIS — R262 Difficulty in walking, not elsewhere classified: Secondary | ICD-10-CM | POA: Diagnosis not present

## 2021-11-18 DIAGNOSIS — F1190 Opioid use, unspecified, uncomplicated: Secondary | ICD-10-CM | POA: Diagnosis not present

## 2021-11-18 DIAGNOSIS — R609 Edema, unspecified: Secondary | ICD-10-CM | POA: Diagnosis not present

## 2021-11-18 DIAGNOSIS — M79601 Pain in right arm: Secondary | ICD-10-CM | POA: Diagnosis not present

## 2021-11-18 DIAGNOSIS — G9341 Metabolic encephalopathy: Secondary | ICD-10-CM | POA: Diagnosis not present

## 2021-11-18 DIAGNOSIS — M48061 Spinal stenosis, lumbar region without neurogenic claudication: Secondary | ICD-10-CM | POA: Diagnosis not present

## 2021-11-18 DIAGNOSIS — R2689 Other abnormalities of gait and mobility: Secondary | ICD-10-CM | POA: Diagnosis not present

## 2021-11-18 DIAGNOSIS — M6281 Muscle weakness (generalized): Secondary | ICD-10-CM | POA: Diagnosis not present

## 2021-11-21 DIAGNOSIS — R262 Difficulty in walking, not elsewhere classified: Secondary | ICD-10-CM | POA: Diagnosis not present

## 2021-11-21 DIAGNOSIS — M48061 Spinal stenosis, lumbar region without neurogenic claudication: Secondary | ICD-10-CM | POA: Diagnosis not present

## 2021-11-21 DIAGNOSIS — M25541 Pain in joints of right hand: Secondary | ICD-10-CM | POA: Diagnosis not present

## 2021-11-21 DIAGNOSIS — M6281 Muscle weakness (generalized): Secondary | ICD-10-CM | POA: Diagnosis not present

## 2021-11-21 DIAGNOSIS — R2689 Other abnormalities of gait and mobility: Secondary | ICD-10-CM | POA: Diagnosis not present

## 2021-11-21 DIAGNOSIS — M24542 Contracture, left hand: Secondary | ICD-10-CM | POA: Diagnosis not present

## 2021-11-21 DIAGNOSIS — G9341 Metabolic encephalopathy: Secondary | ICD-10-CM | POA: Diagnosis not present

## 2021-11-22 DIAGNOSIS — M48061 Spinal stenosis, lumbar region without neurogenic claudication: Secondary | ICD-10-CM | POA: Diagnosis not present

## 2021-11-22 DIAGNOSIS — M6281 Muscle weakness (generalized): Secondary | ICD-10-CM | POA: Diagnosis not present

## 2021-11-22 DIAGNOSIS — R262 Difficulty in walking, not elsewhere classified: Secondary | ICD-10-CM | POA: Diagnosis not present

## 2021-11-22 DIAGNOSIS — G9341 Metabolic encephalopathy: Secondary | ICD-10-CM | POA: Diagnosis not present

## 2021-11-22 DIAGNOSIS — L97422 Non-pressure chronic ulcer of left heel and midfoot with fat layer exposed: Secondary | ICD-10-CM | POA: Diagnosis not present

## 2021-11-22 DIAGNOSIS — R2689 Other abnormalities of gait and mobility: Secondary | ICD-10-CM | POA: Diagnosis not present

## 2021-11-23 DIAGNOSIS — K648 Other hemorrhoids: Secondary | ICD-10-CM | POA: Diagnosis not present

## 2021-11-23 DIAGNOSIS — G8929 Other chronic pain: Secondary | ICD-10-CM | POA: Diagnosis not present

## 2021-11-23 DIAGNOSIS — U071 COVID-19: Secondary | ICD-10-CM | POA: Diagnosis not present

## 2021-11-23 DIAGNOSIS — M6281 Muscle weakness (generalized): Secondary | ICD-10-CM | POA: Diagnosis not present

## 2021-11-23 DIAGNOSIS — G9341 Metabolic encephalopathy: Secondary | ICD-10-CM | POA: Diagnosis not present

## 2021-11-23 DIAGNOSIS — E118 Type 2 diabetes mellitus with unspecified complications: Secondary | ICD-10-CM | POA: Diagnosis not present

## 2021-11-23 DIAGNOSIS — R2689 Other abnormalities of gait and mobility: Secondary | ICD-10-CM | POA: Diagnosis not present

## 2021-11-23 DIAGNOSIS — E11621 Type 2 diabetes mellitus with foot ulcer: Secondary | ICD-10-CM | POA: Diagnosis not present

## 2021-11-23 DIAGNOSIS — M48061 Spinal stenosis, lumbar region without neurogenic claudication: Secondary | ICD-10-CM | POA: Diagnosis not present

## 2021-11-23 DIAGNOSIS — E6609 Other obesity due to excess calories: Secondary | ICD-10-CM | POA: Diagnosis not present

## 2021-11-23 DIAGNOSIS — R262 Difficulty in walking, not elsewhere classified: Secondary | ICD-10-CM | POA: Diagnosis not present

## 2021-11-24 DIAGNOSIS — G9341 Metabolic encephalopathy: Secondary | ICD-10-CM | POA: Diagnosis not present

## 2021-11-24 DIAGNOSIS — R2689 Other abnormalities of gait and mobility: Secondary | ICD-10-CM | POA: Diagnosis not present

## 2021-11-24 DIAGNOSIS — M48061 Spinal stenosis, lumbar region without neurogenic claudication: Secondary | ICD-10-CM | POA: Diagnosis not present

## 2021-11-24 DIAGNOSIS — R262 Difficulty in walking, not elsewhere classified: Secondary | ICD-10-CM | POA: Diagnosis not present

## 2021-11-24 DIAGNOSIS — M6281 Muscle weakness (generalized): Secondary | ICD-10-CM | POA: Diagnosis not present

## 2021-11-25 DIAGNOSIS — R2689 Other abnormalities of gait and mobility: Secondary | ICD-10-CM | POA: Diagnosis not present

## 2021-11-25 DIAGNOSIS — G9341 Metabolic encephalopathy: Secondary | ICD-10-CM | POA: Diagnosis not present

## 2021-11-25 DIAGNOSIS — R262 Difficulty in walking, not elsewhere classified: Secondary | ICD-10-CM | POA: Diagnosis not present

## 2021-11-25 DIAGNOSIS — M48061 Spinal stenosis, lumbar region without neurogenic claudication: Secondary | ICD-10-CM | POA: Diagnosis not present

## 2021-11-25 DIAGNOSIS — M6281 Muscle weakness (generalized): Secondary | ICD-10-CM | POA: Diagnosis not present

## 2021-11-26 DIAGNOSIS — M6281 Muscle weakness (generalized): Secondary | ICD-10-CM | POA: Diagnosis not present

## 2021-11-26 DIAGNOSIS — R2689 Other abnormalities of gait and mobility: Secondary | ICD-10-CM | POA: Diagnosis not present

## 2021-11-26 DIAGNOSIS — G9341 Metabolic encephalopathy: Secondary | ICD-10-CM | POA: Diagnosis not present

## 2021-11-26 DIAGNOSIS — M48061 Spinal stenosis, lumbar region without neurogenic claudication: Secondary | ICD-10-CM | POA: Diagnosis not present

## 2021-11-26 DIAGNOSIS — R262 Difficulty in walking, not elsewhere classified: Secondary | ICD-10-CM | POA: Diagnosis not present

## 2021-11-28 DIAGNOSIS — M6281 Muscle weakness (generalized): Secondary | ICD-10-CM | POA: Diagnosis not present

## 2021-11-28 DIAGNOSIS — M79601 Pain in right arm: Secondary | ICD-10-CM | POA: Diagnosis not present

## 2021-11-28 DIAGNOSIS — F409 Phobic anxiety disorder, unspecified: Secondary | ICD-10-CM | POA: Diagnosis not present

## 2021-11-28 DIAGNOSIS — L97409 Non-pressure chronic ulcer of unspecified heel and midfoot with unspecified severity: Secondary | ICD-10-CM | POA: Diagnosis not present

## 2021-11-29 DIAGNOSIS — R0902 Hypoxemia: Secondary | ICD-10-CM | POA: Diagnosis not present

## 2021-11-29 DIAGNOSIS — L97422 Non-pressure chronic ulcer of left heel and midfoot with fat layer exposed: Secondary | ICD-10-CM | POA: Diagnosis not present

## 2021-11-29 DIAGNOSIS — I4891 Unspecified atrial fibrillation: Secondary | ICD-10-CM | POA: Diagnosis not present

## 2021-11-29 DIAGNOSIS — R41841 Cognitive communication deficit: Secondary | ICD-10-CM | POA: Diagnosis not present

## 2021-11-29 DIAGNOSIS — R4182 Altered mental status, unspecified: Secondary | ICD-10-CM | POA: Diagnosis not present

## 2021-11-29 DIAGNOSIS — M6281 Muscle weakness (generalized): Secondary | ICD-10-CM | POA: Diagnosis not present

## 2021-11-29 DIAGNOSIS — Z87440 Personal history of urinary (tract) infections: Secondary | ICD-10-CM | POA: Diagnosis not present

## 2021-11-29 DIAGNOSIS — I152 Hypertension secondary to endocrine disorders: Secondary | ICD-10-CM | POA: Diagnosis not present

## 2021-11-30 ENCOUNTER — Other Ambulatory Visit: Payer: Self-pay

## 2021-11-30 ENCOUNTER — Emergency Department (HOSPITAL_COMMUNITY): Payer: Medicare PPO

## 2021-11-30 ENCOUNTER — Inpatient Hospital Stay (HOSPITAL_COMMUNITY)
Admission: EM | Admit: 2021-11-30 | Discharge: 2021-12-03 | DRG: 871 | Disposition: A | Discharge status: Skilled Nursing Facility | Payer: Medicare PPO | Source: Skilled Nursing Facility | Attending: Family Medicine | Admitting: Family Medicine

## 2021-11-30 ENCOUNTER — Encounter (HOSPITAL_COMMUNITY): Payer: Self-pay | Admitting: Emergency Medicine

## 2021-11-30 DIAGNOSIS — N390 Urinary tract infection, site not specified: Secondary | ICD-10-CM | POA: Diagnosis present

## 2021-11-30 DIAGNOSIS — Z86711 Personal history of pulmonary embolism: Secondary | ICD-10-CM

## 2021-11-30 DIAGNOSIS — K219 Gastro-esophageal reflux disease without esophagitis: Secondary | ICD-10-CM | POA: Diagnosis present

## 2021-11-30 DIAGNOSIS — Z7984 Long term (current) use of oral hypoglycemic drugs: Secondary | ICD-10-CM | POA: Diagnosis not present

## 2021-11-30 DIAGNOSIS — R0902 Hypoxemia: Secondary | ICD-10-CM | POA: Diagnosis not present

## 2021-11-30 DIAGNOSIS — E1169 Type 2 diabetes mellitus with other specified complication: Secondary | ICD-10-CM | POA: Diagnosis present

## 2021-11-30 DIAGNOSIS — E11621 Type 2 diabetes mellitus with foot ulcer: Secondary | ICD-10-CM | POA: Diagnosis not present

## 2021-11-30 DIAGNOSIS — E1140 Type 2 diabetes mellitus with diabetic neuropathy, unspecified: Secondary | ICD-10-CM | POA: Diagnosis present

## 2021-11-30 DIAGNOSIS — Z6837 Body mass index (BMI) 37.0-37.9, adult: Secondary | ICD-10-CM | POA: Diagnosis not present

## 2021-11-30 DIAGNOSIS — K529 Noninfective gastroenteritis and colitis, unspecified: Secondary | ICD-10-CM | POA: Diagnosis not present

## 2021-11-30 DIAGNOSIS — E785 Hyperlipidemia, unspecified: Secondary | ICD-10-CM | POA: Diagnosis present

## 2021-11-30 DIAGNOSIS — N179 Acute kidney failure, unspecified: Secondary | ICD-10-CM | POA: Diagnosis present

## 2021-11-30 DIAGNOSIS — Z7901 Long term (current) use of anticoagulants: Secondary | ICD-10-CM | POA: Diagnosis not present

## 2021-11-30 DIAGNOSIS — I959 Hypotension, unspecified: Secondary | ICD-10-CM | POA: Diagnosis not present

## 2021-11-30 DIAGNOSIS — E860 Dehydration: Principal | ICD-10-CM

## 2021-11-30 DIAGNOSIS — I10 Essential (primary) hypertension: Secondary | ICD-10-CM | POA: Diagnosis present

## 2021-11-30 DIAGNOSIS — R5381 Other malaise: Secondary | ICD-10-CM | POA: Diagnosis not present

## 2021-11-30 DIAGNOSIS — D500 Iron deficiency anemia secondary to blood loss (chronic): Secondary | ICD-10-CM | POA: Diagnosis present

## 2021-11-30 DIAGNOSIS — L8990 Pressure ulcer of unspecified site, unspecified stage: Secondary | ICD-10-CM | POA: Insufficient documentation

## 2021-11-30 DIAGNOSIS — E6609 Other obesity due to excess calories: Secondary | ICD-10-CM | POA: Diagnosis present

## 2021-11-30 DIAGNOSIS — R6520 Severe sepsis without septic shock: Secondary | ICD-10-CM | POA: Diagnosis present

## 2021-11-30 DIAGNOSIS — Z7401 Bed confinement status: Secondary | ICD-10-CM | POA: Diagnosis not present

## 2021-11-30 DIAGNOSIS — I482 Chronic atrial fibrillation, unspecified: Secondary | ICD-10-CM | POA: Diagnosis present

## 2021-11-30 DIAGNOSIS — Z794 Long term (current) use of insulin: Secondary | ICD-10-CM | POA: Diagnosis not present

## 2021-11-30 DIAGNOSIS — D638 Anemia in other chronic diseases classified elsewhere: Secondary | ICD-10-CM | POA: Diagnosis present

## 2021-11-30 DIAGNOSIS — Z8616 Personal history of COVID-19: Secondary | ICD-10-CM | POA: Diagnosis not present

## 2021-11-30 DIAGNOSIS — Z79899 Other long term (current) drug therapy: Secondary | ICD-10-CM | POA: Diagnosis not present

## 2021-11-30 DIAGNOSIS — A419 Sepsis, unspecified organism: Secondary | ICD-10-CM

## 2021-11-30 DIAGNOSIS — I4891 Unspecified atrial fibrillation: Secondary | ICD-10-CM | POA: Diagnosis present

## 2021-11-30 DIAGNOSIS — E43 Unspecified severe protein-calorie malnutrition: Secondary | ICD-10-CM | POA: Diagnosis present

## 2021-11-30 DIAGNOSIS — R509 Fever, unspecified: Secondary | ICD-10-CM | POA: Diagnosis not present

## 2021-11-30 DIAGNOSIS — R29810 Facial weakness: Secondary | ICD-10-CM | POA: Diagnosis not present

## 2021-11-30 DIAGNOSIS — R5383 Other fatigue: Secondary | ICD-10-CM | POA: Diagnosis not present

## 2021-11-30 DIAGNOSIS — Z8249 Family history of ischemic heart disease and other diseases of the circulatory system: Secondary | ICD-10-CM | POA: Diagnosis not present

## 2021-11-30 DIAGNOSIS — R531 Weakness: Secondary | ICD-10-CM

## 2021-11-30 DIAGNOSIS — R0689 Other abnormalities of breathing: Secondary | ICD-10-CM | POA: Diagnosis not present

## 2021-11-30 DIAGNOSIS — A4151 Sepsis due to Escherichia coli [E. coli]: Secondary | ICD-10-CM | POA: Diagnosis present

## 2021-11-30 DIAGNOSIS — M2550 Pain in unspecified joint: Secondary | ICD-10-CM | POA: Diagnosis not present

## 2021-11-30 DIAGNOSIS — N3000 Acute cystitis without hematuria: Secondary | ICD-10-CM | POA: Diagnosis not present

## 2021-11-30 DIAGNOSIS — R4182 Altered mental status, unspecified: Secondary | ICD-10-CM | POA: Diagnosis not present

## 2021-11-30 LAB — COMPREHENSIVE METABOLIC PANEL
ALT: 16 U/L (ref 0–44)
AST: 20 U/L (ref 15–41)
Albumin: 2.4 g/dL — ABNORMAL LOW (ref 3.5–5.0)
Alkaline Phosphatase: 67 U/L (ref 38–126)
Anion gap: 12 (ref 5–15)
BUN: 56 mg/dL — ABNORMAL HIGH (ref 8–23)
CO2: 22 mmol/L (ref 22–32)
Calcium: 7.9 mg/dL — ABNORMAL LOW (ref 8.9–10.3)
Chloride: 101 mmol/L (ref 98–111)
Creatinine, Ser: 2.36 mg/dL — ABNORMAL HIGH (ref 0.44–1.00)
GFR, Estimated: 20 mL/min — ABNORMAL LOW (ref >60)
Glucose, Bld: 109 mg/dL — ABNORMAL HIGH (ref 70–99)
Potassium: 4.4 mmol/L (ref 3.5–5.1)
Sodium: 135 mmol/L (ref 135–145)
Total Bilirubin: 0.7 mg/dL (ref 0.3–1.2)
Total Protein: 6.7 g/dL (ref 6.5–8.1)

## 2021-11-30 LAB — CBC WITH DIFFERENTIAL/PLATELET
Abs Immature Granulocytes: 0.05 10*3/uL (ref 0.00–0.07)
Basophils Absolute: 0 10*3/uL (ref 0.0–0.1)
Basophils Relative: 0 %
Eosinophils Absolute: 0 10*3/uL (ref 0.0–0.5)
Eosinophils Relative: 0 %
HCT: 25.8 % — ABNORMAL LOW (ref 36.0–46.0)
Hemoglobin: 8 g/dL — ABNORMAL LOW (ref 12.0–15.0)
Immature Granulocytes: 0 %
Lymphocytes Relative: 7 %
Lymphs Abs: 0.8 10*3/uL (ref 0.7–4.0)
MCH: 25.6 pg — ABNORMAL LOW (ref 26.0–34.0)
MCHC: 31 g/dL (ref 30.0–36.0)
MCV: 82.7 fL (ref 80.0–100.0)
Monocytes Absolute: 0.4 10*3/uL (ref 0.1–1.0)
Monocytes Relative: 4 %
Neutro Abs: 10.1 10*3/uL — ABNORMAL HIGH (ref 1.7–7.7)
Neutrophils Relative %: 89 %
Platelets: 260 10*3/uL (ref 150–400)
RBC: 3.12 MIL/uL — ABNORMAL LOW (ref 3.87–5.11)
RDW: 15.9 % — ABNORMAL HIGH (ref 11.5–15.5)
WBC: 11.3 10*3/uL — ABNORMAL HIGH (ref 4.0–10.5)
nRBC: 0 % (ref 0.0–0.2)

## 2021-11-30 LAB — TROPONIN I (HIGH SENSITIVITY)
Troponin I (High Sensitivity): 13 ng/L (ref <18)
Troponin I (High Sensitivity): 15 ng/L (ref <18)

## 2021-11-30 LAB — LACTIC ACID, PLASMA: Lactic Acid, Venous: 1.3 mmol/L (ref 0.5–1.9)

## 2021-11-30 MED ORDER — ONDANSETRON HCL 4 MG/2ML IJ SOLN
4.0000 mg | Freq: Four times a day (QID) | INTRAMUSCULAR | Status: DC | PRN
  Filled 2021-11-30: qty 2

## 2021-11-30 MED ORDER — EZETIMIBE 10 MG PO TABS
10.0000 mg | ORAL_TABLET | Freq: Every day | ORAL | Status: DC
  Administered 2021-11-30 – 2021-12-02 (×3): 10 mg via ORAL
  Filled 2021-11-30 (×3): qty 1

## 2021-11-30 MED ORDER — INSULIN ASPART 100 UNIT/ML IJ SOLN
0.0000 [IU] | Freq: Three times a day (TID) | INTRAMUSCULAR | Status: DC
  Administered 2021-12-01: 0 [IU] via SUBCUTANEOUS
  Administered 2021-12-02: 1 [IU] via SUBCUTANEOUS
  Administered 2021-12-03: 2 [IU] via SUBCUTANEOUS
  Filled 2021-11-30: qty 0.09

## 2021-11-30 MED ORDER — CARVEDILOL 6.25 MG PO TABS
6.2500 mg | ORAL_TABLET | Freq: Two times a day (BID) | ORAL | Status: DC
Start: 2021-11-30 — End: 2021-12-03
  Administered 2021-11-30 – 2021-12-03 (×6): 6.25 mg via ORAL
  Filled 2021-11-30 (×6): qty 1

## 2021-11-30 MED ORDER — MUSCLE RUB 10-15 % EX CREA
TOPICAL_CREAM | Freq: Three times a day (TID) | CUTANEOUS | Status: DC | PRN
  Filled 2021-11-30: qty 85

## 2021-11-30 MED ORDER — SODIUM CHLORIDE 0.9 % IV BOLUS
1000.0000 mL | Freq: Once | INTRAVENOUS | Status: AC
  Administered 2021-11-30: 1000 mL via INTRAVENOUS

## 2021-11-30 MED ORDER — APIXABAN 5 MG PO TABS
5.0000 mg | ORAL_TABLET | Freq: Two times a day (BID) | ORAL | Status: DC
Start: 2021-11-30 — End: 2021-12-03
  Administered 2021-11-30 – 2021-12-03 (×6): 5 mg via ORAL
  Filled 2021-11-30 (×6): qty 1

## 2021-11-30 MED ORDER — DOCUSATE SODIUM 100 MG PO CAPS
100.0000 mg | ORAL_CAPSULE | Freq: Every morning | ORAL | Status: DC
  Administered 2021-12-01 – 2021-12-03 (×2): 100 mg via ORAL
  Filled 2021-11-30 (×2): qty 1

## 2021-11-30 MED ORDER — LACTATED RINGERS IV BOLUS (SEPSIS)
1000.0000 mL | Freq: Once | INTRAVENOUS | Status: AC
  Administered 2021-11-30: 1000 mL via INTRAVENOUS

## 2021-11-30 MED ORDER — SODIUM CHLORIDE 0.9 % IV SOLN
2.0000 g | Freq: Once | INTRAVENOUS | Status: AC
  Administered 2021-11-30: 2 g via INTRAVENOUS
  Filled 2021-11-30: qty 12.5

## 2021-11-30 MED ORDER — MENTHOL (TOPICAL ANALGESIC) 4 % EX GEL: 1.0000 "application " | CUTANEOUS | Status: DC

## 2021-11-30 MED ORDER — LIDOCAINE 5 % EX PTCH
2.0000 | MEDICATED_PATCH | CUTANEOUS | Status: DC
  Administered 2021-11-30 – 2021-12-01 (×2): 2 via TRANSDERMAL
  Filled 2021-11-30 (×3): qty 2

## 2021-11-30 MED ORDER — ACETAMINOPHEN 650 MG RE SUPP: 650.0000 mg | Freq: Four times a day (QID) | RECTAL | Status: DC | PRN

## 2021-11-30 MED ORDER — FLUTICASONE PROPIONATE 50 MCG/ACT NA SUSP
1.0000 | Freq: Every day | NASAL | Status: DC
  Filled 2021-11-30: qty 16

## 2021-11-30 MED ORDER — GABAPENTIN 100 MG PO CAPS
100.0000 mg | ORAL_CAPSULE | Freq: Two times a day (BID) | ORAL | Status: DC
  Administered 2021-11-30 – 2021-12-02 (×4): 100 mg via ORAL
  Filled 2021-11-30 (×4): qty 1

## 2021-11-30 MED ORDER — VANCOMYCIN HCL IN DEXTROSE 1-5 GM/200ML-% IV SOLN
1000.0000 mg | Freq: Once | INTRAVENOUS | Status: AC
  Administered 2021-11-30: 1000 mg via INTRAVENOUS
  Filled 2021-11-30: qty 200

## 2021-11-30 MED ORDER — NITROGLYCERIN 0.4 MG SL SUBL: 0.4000 mg | SUBLINGUAL_TABLET | SUBLINGUAL | Status: DC | PRN

## 2021-11-30 MED ORDER — SODIUM CHLORIDE 0.9 % IV SOLN: Freq: Once | INTRAVENOUS | Status: DC

## 2021-11-30 MED ORDER — VANCOMYCIN HCL 750 MG/150ML IV SOLN
750.0000 mg | INTRAVENOUS | Status: DC
  Administered 2021-11-30: 750 mg via INTRAVENOUS
  Filled 2021-11-30 (×2): qty 150

## 2021-11-30 MED ORDER — ATORVASTATIN CALCIUM 40 MG PO TABS
80.0000 mg | ORAL_TABLET | Freq: Every day | ORAL | Status: DC
  Administered 2021-11-30 – 2021-12-02 (×3): 80 mg via ORAL
  Filled 2021-11-30 (×3): qty 2

## 2021-11-30 MED ORDER — ONDANSETRON HCL 4 MG PO TABS: 4.0000 mg | ORAL_TABLET | Freq: Four times a day (QID) | ORAL | Status: DC | PRN

## 2021-11-30 MED ORDER — FAMOTIDINE 20 MG PO TABS
20.0000 mg | ORAL_TABLET | Freq: Two times a day (BID) | ORAL | Status: DC
  Administered 2021-11-30: 20 mg via ORAL
  Filled 2021-11-30: qty 1

## 2021-11-30 MED ORDER — LACTATED RINGERS IV SOLN: INTRAVENOUS | Status: DC

## 2021-11-30 MED ORDER — SODIUM CHLORIDE 0.9 % IV SOLN
2.0000 g | Freq: Every day | INTRAVENOUS | Status: DC
  Administered 2021-12-01 – 2021-12-02 (×2): 2 g via INTRAVENOUS
  Filled 2021-11-30 (×2): qty 12.5

## 2021-11-30 MED ORDER — MELATONIN 5 MG PO TABS
5.0000 mg | ORAL_TABLET | Freq: Every evening | ORAL | Status: DC | PRN
  Filled 2021-11-30: qty 1

## 2021-11-30 MED ORDER — ACETAMINOPHEN 325 MG PO TABS
650.0000 mg | ORAL_TABLET | Freq: Four times a day (QID) | ORAL | Status: DC | PRN
  Administered 2021-11-30: 650 mg via ORAL
  Filled 2021-11-30: qty 2

## 2021-11-30 MED ORDER — ASCORBIC ACID 500 MG PO TABS
500.0000 mg | ORAL_TABLET | Freq: Two times a day (BID) | ORAL | Status: DC
  Administered 2021-11-30 – 2021-12-03 (×6): 500 mg via ORAL
  Filled 2021-11-30 (×6): qty 1

## 2021-11-30 MED ORDER — LORATADINE 10 MG PO TABS
10.0000 mg | ORAL_TABLET | Freq: Every day | ORAL | Status: DC
  Administered 2021-11-30 – 2021-12-02 (×3): 10 mg via ORAL
  Filled 2021-11-30 (×3): qty 1

## 2021-11-30 MED ORDER — FERROUS SULFATE 325 (65 FE) MG PO TABS
325.0000 mg | ORAL_TABLET | Freq: Two times a day (BID) | ORAL | Status: DC
  Administered 2021-12-01 – 2021-12-03 (×5): 325 mg via ORAL
  Filled 2021-11-30 (×5): qty 1

## 2021-11-30 MED ORDER — GABAPENTIN 300 MG PO CAPS: 400.0000 mg | ORAL_CAPSULE | Freq: Two times a day (BID) | ORAL | Status: DC

## 2021-11-30 NOTE — Plan of Care (Signed)
?  Problem: Nutrition: ?Goal: Adequate nutrition will be maintained ?11/30/2021 2327 by Jerrol Banana, RN ?Outcome: Progressing ?11/30/2021 2327 by Jerrol Banana, RN ?Outcome: Progressing ?  ?

## 2021-11-30 NOTE — H&P (Signed)
?History and Physical  ? ? ?Patient: Kayla [name] CNO:[id number] DOB: 27-Mar-1941 ?DOA: 11/30/2021 ?DOS: the patient was seen and examined on 11/30/2021 ?PCP: Denita Lung, MD  ?Patient coming from: SNF ? ?Chief Complaint:  ?Chief Complaint  ?Patient presents with  ? Altered Mental Status  ? ?HPI: Kayla [name] is a 81 y.o. female with medical history significant of cystitis without hematuria, history of metabolic encephalopathy, osteoarthritis, and chronic atrial flutter/atrial fibrillation, COVID-19 infection, class II obesity, type II DM, hyperlipidemia, hypertension, facial droop, lumbar nerve impingement, pulmonary embolism who was sent from her facility due to altered mental status, facial droop, decreased mobility and fever of 102 ?F.  She was just started on a daily ceftriaxone IM regimen at the facility for presumptive UTI.  She is only oriented to name at this time.  She is able to answer simple questions.  She denied headache, dyspnea, chest, abdominal or back pain at the moment. ? ?ED course: Initial vital signs were temperature 98.5 ?F, pulse 92, respirations 25, BP 85/56 mmHg and O2 sat 98% on room air.  The patient received 2000 mL of normal saline bolus, 1000 mg of vancomycin and 2000 mg of cefepime IVPB.  I added LR 1000 mL bolus over 3 hours. ? ?Lab work: CBC showed a white count 11.3, hemoglobin 8.0 g/dL and platelets 260.  Lactic acid and first troponin were normal.  CMP with normal electrolytes when calcium is corrected.  Glucose 109, BUN 56 and creatinine 2.36 mg/dL (her baseline creatinine is usually under 1).  LFTs show an albumin level of 2.4 g/dL, but otherwise normal. ? ?Imaging: One-view portable chest radiograph with no evidence of acute cardiopulmonary disease.  CT head without contrast with no acute intracranial normality.  There was signs of atrophy and chronic microvascular ischemic change as previously seen.  Please look at the images and full radiology report for further  details. ? ?Review of Systems: As mentioned in the history of present illness. All other systems reviewed and are negative. ?Past Medical History:  ?Diagnosis Date  ? Acute cystitis without hematuria 08/26/2021  ? Acute metabolic encephalopathy 2/94/7654  ? Arthritis   ? Atrial fibrillation, chronic ([place])   ? Atrial flutter ([place]) 03/06/2021  ? COVID-19 virus infection 08/26/2021  ? Diabetes mellitus   ? Facial droop 08/26/2021  ? Hypertension   ? Lumbar nerve root impingement 06/24/2021  ? Obesity   ? Pulmonary embolus ([place])   ? Type 2 diabetes mellitus ([place]) 03/05/2021  ? ?Past Surgical History:  ?Procedure Laterality Date  ? ABDOMINAL HYSTERECTOMY    ? CHOLECYSTECTOMY    ? JOINT REPLACEMENT  Right  ? ?Social History:  reports that she has never smoked. She has never used smokeless tobacco. She reports that she does not drink alcohol and does not use drugs. ? ?No Known Allergies ? ?Family History  ?Problem Relation Age of Onset  ? Hypertension Mother   ? ? ?Prior to Admission medications   ?Medication Sig Start Date End Date Taking? Authorizing Provider  ?acetaminophen (TYLENOL) 325 MG tablet Take 650 mg by mouth every 8 (eight) hours as needed (pain).   Yes [provider]  ?Amino Acids-Protein Hydrolys (PRO-STAT AWC) LIQD Take 30 mLs by mouth 2 (two) times daily.   Yes [provider]  ?apixaban (ELIQUIS) 5 MG TABS tablet Take 5 mg by mouth 2 (two) times daily.   Yes [provider]  ?ascorbic acid (VITAMIN C) 500 MG tablet Take 1 tablet (500  mg total) by mouth daily. ?Patient taking differently: Take 500 mg by mouth 2 (two) times daily. 08/30/21  Yes Regalado, Belkys A, MD  ?atorvastatin (LIPITOR) 80 MG tablet Take 1 tablet (80 mg total) by mouth daily. ?Patient taking differently: Take 80 mg by mouth at bedtime. 01/20/20  Yes Denita Lung, MD  ?carvedilol (COREG) 6.25 MG tablet Take 1 tablet (6.25 mg total) by mouth 2 (two) times daily. 03/31/21  Yes Chandrasekhar, Mahesh A, MD  ?diclofenac  Sodium (VOLTAREN) 1 % GEL Apply 2 g topically 4 (four) times daily. Rub into affected area of foot 2 to 4 times daily ?Patient taking differently: Apply 2 g topically 2 (two) times daily. Apply to feet for pain 03/31/21  Yes Tysinger, Camelia Eng, PA-C  ?docusate sodium (COLACE) 100 MG capsule Take 100 mg by mouth every morning. Take with apply juice   Yes [provider]  ?ezetimibe (ZETIA) 10 MG tablet Take 10 mg by mouth at bedtime.   Yes [provider]  ?ferrous sulfate 325 (65 FE) MG tablet Take 325 mg by mouth 2 (two) times daily.   Yes [provider]  ?fluticasone (FLONASE) 50 MCG/ACT nasal spray Place 1 spray into both nostrils at bedtime.   Yes [provider]  ?gabapentin (NEURONTIN) 400 MG capsule Take 400 mg by mouth 2 (two) times daily.   Yes [provider]  ?hydrochlorothiazide (HYDRODIURIL) 12.5 MG tablet Take 12.5 mg by mouth every morning.   Yes [provider]  ?ipratropium-albuterol (DUONEB) 0.5-2.5 (3) MG/3ML SOLN Take 3 mLs by nebulization every 4 (four) hours as needed. ?Patient taking differently: Take 3 mLs by nebulization every 4 (four) hours as needed (wheezing/ shortness of breath). 08/29/21  Yes Regalado, Belkys A, MD  ?lidocaine (LIDODERM) 5 % Place 1 patch onto the skin See admin instructions. Apply 1 patch transdermally to right shoulder and 1 patch to right elbow - every afternoon. Remove & Discard patch within 12 hours or as directed by MD   Yes [provider]  ?lidocaine 4 % Place 2 patches onto the skin See admin instructions. Apply 2 patches transdermally to left leg every morning, remove nightly   Yes [provider]  ?loratadine (CLARITIN) 10 MG tablet Take 10 mg by mouth at bedtime.   Yes [provider]  ?losartan (COZAAR) 25 MG tablet Take 25 mg by mouth every morning.   Yes [provider]  ?melatonin 5 MG TABS Take 5 mg by mouth at bedtime as needed (sleep).   Yes [provider]   ?Menthol, Topical Analgesic, (BIOFREEZE) 4 % GEL Apply 1 application. topically See admin instructions. Apply thin layer topically to right shoulder and arm three times daily for pain, may apply to other affected areas also   Yes [provider]  ?metFORMIN (GLUCOPHAGE) 500 MG tablet Take 1 tablet (500 mg total) by mouth 2 (two) times daily with a meal. ?Patient taking differently: Take 500 mg by mouth 2 (two) times daily with a meal. 12/28/20  Yes Denita Lung, MD  ?nitroGLYCERIN (NITROSTAT) 0.4 MG SL tablet Take up to 3 tablets ?Patient taking differently: 0.4 mg every 5 (five) minutes as needed for chest pain (max 3 tablets). 03/31/21  Yes Chandrasekhar, Mahesh A, MD  ?Accu-Chek Softclix Lancets lancets 1 each by Other route daily. Use as instructed 03/30/20   Denita Lung, MD  ?cefTRIAXone (ROCEPHIN) 1 g injection Inject 1 g into the muscle daily. ?Patient not taking: Reported on 11/30/2021  [provider]  ?glucose blood test strip 1 each by Other route as needed for other. Use as instructed ?Patient taking differently: 1 each by Other route daily. Use as instructed 12/31/20   Denita Lung, MD  ? ? ?Physical Exam: ?Vitals:  ? 11/30/21 1345 11/30/21 1400 11/30/21 1415 11/30/21 1445  ?BP: (!) 106/50 (!) 121/50 (!) 123/56 (!) 117/55  ?Pulse: 87 85 87 89  ?Resp: (!) 21 (!) 22 (!) 24 19  ?Temp:      ?TempSrc:      ?SpO2: 97% 100% 99% 99%  ? ?Physical Exam ?Vitals and nursing note reviewed.  ?Constitutional:   ?   Appearance: Normal appearance. She is obese.  ?HENT:  ?   Head: Normocephalic.  ?   Mouth/Throat:  ?   Mouth: Mucous membranes are dry.  ?Eyes:  ?   General: No scleral icterus. ?   Pupils: Pupils are equal, round, and reactive to light.  ?Neck:  ?   Vascular: No JVD.  ?Cardiovascular:  ?   Rate and Rhythm: Normal rate and regular rhythm.  ?   Heart sounds: S1 normal and S2 normal.  ?Pulmonary:  ?   Effort: Pulmonary effort is normal.  ?   Breath sounds: Normal breath sounds. No  wheezing, rhonchi or rales.  ?Abdominal:  ?   General: Bowel sounds are normal. There is no distension.  ?   Palpations: Abdomen is soft.  ?   Tenderness: There is no abdominal tenderness. There is no right CVA ten

## 2021-11-30 NOTE — ED Provider Notes (Signed)
?[facility] ?Provider Note ? ? ?CSN: [id number] ?Arrival date & time: 11/30/21  1209 ? ?  ? ?History ? ?Chief Complaint  ?Patient presents with  ? Altered Mental Status  ? ? ?Kayla [name] is a 81 y.o. female. ? ?81 year old female with prior medical history as detailed below presents from [facility].  Patient with noted decrease in mental status since Sunday.  Patient with reported fever of 102 today obtained just prior to EMS transport. ? ?Patient is confused at baseline.  She is unable to provide significant history. ? ?Past medical history significant for spinal stenosis and limited mobility, chronic A-flutter on Eliquis, hypertension, insulin-dependent diabetes, and hyperlipidemia.  Patient does have prior history of left facial droop. ? ?The history is provided by the patient and medical records.  ?Altered Mental Status ?Presenting symptoms: confusion   ?Severity:  Mild ?Most recent episode:  More than 2 days ago ?Episode history:  Multiple ?Duration:  3 days ?Timing:  Constant ?Progression:  Worsening ? ?  ? ?Home Medications ?Prior to Admission medications   ?Medication Sig Start Date End Date Taking? Authorizing Provider  ?acetaminophen (TYLENOL) 325 MG tablet Take 650 mg by mouth every 8 (eight) hours as needed (pain).   Yes [provider]  ?Amino Acids-Protein Hydrolys (PRO-STAT AWC) LIQD Take 30 mLs by mouth 2 (two) times daily.   Yes [provider]  ?apixaban (ELIQUIS) 5 MG TABS tablet Take 5 mg by mouth 2 (two) times daily.   Yes [provider]  ?ascorbic acid (VITAMIN C) 500 MG tablet Take 1 tablet (500 mg total) by mouth daily. ?Patient taking differently: Take 500 mg by mouth 2 (two) times daily. 08/30/21  Yes Regalado, Belkys A, MD  ?atorvastatin (LIPITOR) 80 MG tablet Take 1 tablet (80 mg total) by mouth daily. ?Patient taking differently: Take 80 mg by mouth at bedtime. 01/20/20  Yes Denita Lung, MD  ?carvedilol (COREG) 6.25 MG  tablet Take 1 tablet (6.25 mg total) by mouth 2 (two) times daily. 03/31/21  Yes Chandrasekhar, Mahesh A, MD  ?diclofenac Sodium (VOLTAREN) 1 % GEL Apply 2 g topically 4 (four) times daily. Rub into affected area of foot 2 to 4 times daily ?Patient taking differently: Apply 2 g topically 2 (two) times daily. Apply to feet for pain 03/31/21  Yes Tysinger, Camelia Eng, PA-C  ?docusate sodium (COLACE) 100 MG capsule Take 100 mg by mouth every morning. Take with apply juice   Yes [provider]  ?ezetimibe (ZETIA) 10 MG tablet Take 10 mg by mouth at bedtime.   Yes [provider]  ?ferrous sulfate 325 (65 FE) MG tablet Take 325 mg by mouth 2 (two) times daily.   Yes [provider]  ?fluticasone (FLONASE) 50 MCG/ACT nasal spray Place 1 spray into both nostrils at bedtime.   Yes [provider]  ?gabapentin (NEURONTIN) 400 MG capsule Take 400 mg by mouth 2 (two) times daily.   Yes [provider]  ?hydrochlorothiazide (HYDRODIURIL) 12.5 MG tablet Take 12.5 mg by mouth every morning.   Yes [provider]  ?ipratropium-albuterol (DUONEB) 0.5-2.5 (3) MG/3ML SOLN Take 3 mLs by nebulization every 4 (four) hours as needed. ?Patient taking differently: Take 3 mLs by nebulization every 4 (four) hours as needed (wheezing/ shortness of breath). 08/29/21  Yes Regalado, Belkys A, MD  ?lidocaine (LIDODERM) 5 % Place 1 patch onto the skin See admin instructions. Apply 1 patch transdermally to right shoulder and 1 patch  to right elbow - every afternoon. Remove & Discard patch within 12 hours or as directed by MD   Yes [provider]  ?lidocaine 4 % Place 2 patches onto the skin See admin instructions. Apply 2 patches transdermally to left leg every morning, remove nightly   Yes [provider]  ?loratadine (CLARITIN) 10 MG tablet Take 10 mg by mouth at bedtime.   Yes [provider]  ?losartan (COZAAR) 25 MG tablet Take 25 mg by mouth every morning.   Yes  [provider]  ?melatonin 5 MG TABS Take 5 mg by mouth at bedtime as needed (sleep).   Yes [provider]  ?Menthol, Topical Analgesic, (BIOFREEZE) 4 % GEL Apply 1 application. topically See admin instructions. Apply thin layer topically to right shoulder and arm three times daily for pain, may apply to other affected areas also   Yes [provider]  ?metFORMIN (GLUCOPHAGE) 500 MG tablet Take 1 tablet (500 mg total) by mouth 2 (two) times daily with a meal. ?Patient taking differently: Take 500 mg by mouth 2 (two) times daily with a meal. 12/28/20  Yes Denita Lung, MD  ?nitroGLYCERIN (NITROSTAT) 0.4 MG SL tablet Take up to 3 tablets ?Patient taking differently: 0.4 mg every 5 (five) minutes as needed for chest pain (max 3 tablets). 03/31/21  Yes Chandrasekhar, Mahesh A, MD  ?Accu-Chek Softclix Lancets lancets 1 each by Other route daily. Use as instructed 03/30/20   Denita Lung, MD  ?cefTRIAXone (ROCEPHIN) 1 g injection Inject 1 g into the muscle daily. ?Patient not taking: Reported on 11/30/2021    [provider]  ?glucose blood test strip 1 each by Other route as needed for other. Use as instructed ?Patient taking differently: 1 each by Other route daily. Use as instructed 12/31/20   Denita Lung, MD  ?   ? ?Allergies    ?Patient has no known allergies.   ? ?Review of Systems   ?Review of Systems  ?Unable to perform ROS: Acuity of condition  ?Psychiatric/Behavioral:  Positive for confusion.   ? ?Physical Exam ?Updated Vital Signs ?BP (!) 117/55   Pulse 89   Temp 98.5 ?F (36.9 ?C) (Oral)   Resp 19   SpO2 99%  ?Physical Exam ?Vitals and nursing note reviewed.  ?Constitutional:   ?   General: She is not in acute distress. ?   Appearance: Normal appearance. She is well-developed.  ?HENT:  ?   Head: Normocephalic and atraumatic.  ?Eyes:  ?   Conjunctiva/sclera: Conjunctivae normal.  ?   Pupils: Pupils are equal, round, and reactive to light.  ?Cardiovascular:  ?   Rate  and Rhythm: Normal rate and regular rhythm.  ?   Heart sounds: Normal heart sounds.  ?Pulmonary:  ?   Effort: Pulmonary effort is normal. No respiratory distress.  ?   Breath sounds: Normal breath sounds.  ?Abdominal:  ?   General: There is no distension.  ?   Palpations: Abdomen is soft.  ?   Tenderness: There is no abdominal tenderness.  ?Musculoskeletal:     ?   General: No deformity. Normal range of motion.  ?   Cervical back: Normal range of motion and neck supple.  ?Skin: ?   General: Skin is warm and dry.  ?Neurological:  ?   General: No focal deficit present.  ?   Mental Status: She is alert.  ? ? ?ED Results / Procedures / Treatments   ?Labs ?(all labs  ordered are listed, but only abnormal results are displayed) ?Labs Reviewed  ?COMPREHENSIVE METABOLIC PANEL - Abnormal; Notable for the following components:  ?    Result Value  ? Glucose, Bld 109 (*)   ? BUN 56 (*)   ? Creatinine, Ser 2.36 (*)   ? Calcium 7.9 (*)   ? Albumin 2.4 (*)   ? GFR, Estimated 20 (*)   ? All other components within normal limits  ?CBC WITH DIFFERENTIAL/PLATELET - Abnormal; Notable for the following components:  ? WBC 11.3 (*)   ? RBC 3.12 (*)   ? Hemoglobin 8.0 (*)   ? HCT 25.8 (*)   ? MCH 25.6 (*)   ? RDW 15.9 (*)   ? Neutro Abs 10.1 (*)   ? All other components within normal limits  ?CULTURE, BLOOD (ROUTINE X 2)  ?URINE CULTURE  ?CULTURE, BLOOD (ROUTINE X 2) W REFLEX TO ID PANEL  ?LACTIC ACID, PLASMA  ?LACTIC ACID, PLASMA  ?URINALYSIS, ROUTINE W REFLEX MICROSCOPIC  ?PROTIME-INR  ?TROPONIN I (HIGH SENSITIVITY)  ?TROPONIN I (HIGH SENSITIVITY)  ? ? ?EKG ?EKG Interpretation ? ?Date/Time:  Wednesday Nov 30 2021 12:32:04 EDT ?Ventricular Rate:  92 ?PR Interval:  141 ?QRS Duration: 86 ?QT Interval:  354 ?QTC Calculation: 438 ?R Axis:   8 ?Text Interpretation: Sinus rhythm Confirmed by Dene Gentry [phone]) on 11/30/2021 12:58:19 PM ? ?Radiology ?CT Head Wo Contrast ? ?Result Date: 11/30/2021 ?CLINICAL DATA:  81 year old female presenting  with mental status changes of unknown cause. EXAM: CT HEAD WITHOUT CONTRAST TECHNIQUE: Contiguous axial images were obtained from the base of the skull through the vertex without intravenous contrast. RADIATION DOSE

## 2021-11-30 NOTE — ED Notes (Signed)
Pts Son Kayla [name]  [phone] ?

## 2021-11-30 NOTE — ED Triage Notes (Signed)
Patient presents [facility] due to a fever of 102, AMS, facial droop and limited mobility. Her last known well was this past Sunday. EMS administered 300 ml of fluid.  ? ? ?Hx: Bilateral foot wounds ? ? ?EMS vitals: ?30 RR ?130/90 BP ?98 HR ?81 CBG ?95% SPO2 on room air ? ? ?

## 2021-11-30 NOTE — Progress Notes (Signed)
Pharmacy Antibiotic Note ? ?Kayla [name] is a 81 y.o. female admitted on 11/30/2021 with sepsis.  Pharmacy has been consulted for Vancomycin dosing. ? ?Weight = 92.1 kg (11/02/2021) ?Vancomycin 1g IV x 1 given at 13:55 (dose = 10.8 mg/kg) ? ?Plan: ?Will give an additional Vancomycin '750mg'$  IV x 1 dose tonight for total dose today ~ 20 mg/kg (loading dose).  Then will continue with:  Vancomycin 750 mg IV Q 48 hrs. Goal AUC 400-550. Expected AUC: 469.5  SCr used: 2.36 ?Cefepime 2gm IV q24h ?F/u culture results and sensivities ? ? ?Temp (24hrs), Avg:98.1 ?F (36.7 ?C), Min:97.8 ?F (36.6 ?C), Max:98.5 ?F (36.9 ?C) ? ?Recent Labs  ?Lab 11/30/21 ?1233 11/30/21 ?1234  ?WBC  --  11.3*  ?CREATININE  --  2.36*  ?LATICACIDVEN 1.3  --   ?  ?CrCl cannot be calculated (Unknown ideal weight.).   ? ?No Known Allergies ? ?Antimicrobials this admission: ?5/3 Vanc >>   ?5/3 Cefepime >>   ? ?Dose adjustments this admission: ?  ? ?Microbiology results: ?5/3 BCx:   ?  ? ?Thank you for allowing pharmacy to be a part of this patient?s care. ? ?Everette Rank, PharmD ?11/30/2021 7:57 PM ? ?

## 2021-11-30 NOTE — Sepsis Progress Note (Signed)
Code sepsis protocol being followed by eLink 

## 2021-11-30 NOTE — Progress Notes (Signed)
A consult was received from an ED physician for vancomycin and cefepime per pharmacy dosing (for an indication other than meningitis). The patient's profile has been reviewed for ht/wt/allergies/indication/available labs. A one time order has been placed for the above antibiotics.  Further antibiotics/pharmacy consults should be ordered by admitting physician if indicated.       ?                ?Reuel Boom, PharmD, BCPS ?[phone] ?11/30/2021, 12:54 PM ? ?

## 2021-12-01 DIAGNOSIS — E1169 Type 2 diabetes mellitus with other specified complication: Secondary | ICD-10-CM | POA: Diagnosis present

## 2021-12-01 DIAGNOSIS — Z8249 Family history of ischemic heart disease and other diseases of the circulatory system: Secondary | ICD-10-CM | POA: Diagnosis not present

## 2021-12-01 DIAGNOSIS — E6609 Other obesity due to excess calories: Secondary | ICD-10-CM | POA: Diagnosis present

## 2021-12-01 DIAGNOSIS — I10 Essential (primary) hypertension: Secondary | ICD-10-CM | POA: Diagnosis present

## 2021-12-01 DIAGNOSIS — Z86711 Personal history of pulmonary embolism: Secondary | ICD-10-CM | POA: Diagnosis not present

## 2021-12-01 DIAGNOSIS — Z8616 Personal history of COVID-19: Secondary | ICD-10-CM | POA: Diagnosis not present

## 2021-12-01 DIAGNOSIS — E860 Dehydration: Secondary | ICD-10-CM | POA: Diagnosis present

## 2021-12-01 DIAGNOSIS — D500 Iron deficiency anemia secondary to blood loss (chronic): Secondary | ICD-10-CM | POA: Diagnosis present

## 2021-12-01 DIAGNOSIS — E43 Unspecified severe protein-calorie malnutrition: Secondary | ICD-10-CM | POA: Diagnosis present

## 2021-12-01 DIAGNOSIS — Z6837 Body mass index (BMI) 37.0-37.9, adult: Secondary | ICD-10-CM | POA: Diagnosis not present

## 2021-12-01 DIAGNOSIS — Z794 Long term (current) use of insulin: Secondary | ICD-10-CM | POA: Diagnosis not present

## 2021-12-01 DIAGNOSIS — K529 Noninfective gastroenteritis and colitis, unspecified: Secondary | ICD-10-CM | POA: Diagnosis not present

## 2021-12-01 DIAGNOSIS — K219 Gastro-esophageal reflux disease without esophagitis: Secondary | ICD-10-CM | POA: Diagnosis present

## 2021-12-01 DIAGNOSIS — Z7984 Long term (current) use of oral hypoglycemic drugs: Secondary | ICD-10-CM | POA: Diagnosis not present

## 2021-12-01 DIAGNOSIS — L8990 Pressure ulcer of unspecified site, unspecified stage: Secondary | ICD-10-CM | POA: Insufficient documentation

## 2021-12-01 DIAGNOSIS — A4151 Sepsis due to Escherichia coli [E. coli]: Secondary | ICD-10-CM | POA: Diagnosis present

## 2021-12-01 DIAGNOSIS — N179 Acute kidney failure, unspecified: Secondary | ICD-10-CM | POA: Diagnosis present

## 2021-12-01 DIAGNOSIS — E785 Hyperlipidemia, unspecified: Secondary | ICD-10-CM | POA: Diagnosis present

## 2021-12-01 DIAGNOSIS — Z79899 Other long term (current) drug therapy: Secondary | ICD-10-CM | POA: Diagnosis not present

## 2021-12-01 DIAGNOSIS — D638 Anemia in other chronic diseases classified elsewhere: Secondary | ICD-10-CM | POA: Diagnosis present

## 2021-12-01 DIAGNOSIS — N390 Urinary tract infection, site not specified: Secondary | ICD-10-CM | POA: Diagnosis present

## 2021-12-01 DIAGNOSIS — R6520 Severe sepsis without septic shock: Secondary | ICD-10-CM | POA: Diagnosis present

## 2021-12-01 DIAGNOSIS — I482 Chronic atrial fibrillation, unspecified: Secondary | ICD-10-CM | POA: Diagnosis present

## 2021-12-01 DIAGNOSIS — E1140 Type 2 diabetes mellitus with diabetic neuropathy, unspecified: Secondary | ICD-10-CM | POA: Diagnosis present

## 2021-12-01 DIAGNOSIS — Z7901 Long term (current) use of anticoagulants: Secondary | ICD-10-CM | POA: Diagnosis not present

## 2021-12-01 LAB — COMPREHENSIVE METABOLIC PANEL
ALT: 17 U/L (ref 0–44)
AST: 18 U/L (ref 15–41)
Albumin: 2.2 g/dL — ABNORMAL LOW (ref 3.5–5.0)
Alkaline Phosphatase: 64 U/L (ref 38–126)
Anion gap: 10 (ref 5–15)
BUN: 54 mg/dL — ABNORMAL HIGH (ref 8–23)
CO2: 21 mmol/L — ABNORMAL LOW (ref 22–32)
Calcium: 7.5 mg/dL — ABNORMAL LOW (ref 8.9–10.3)
Chloride: 106 mmol/L (ref 98–111)
Creatinine, Ser: 1.69 mg/dL — ABNORMAL HIGH (ref 0.44–1.00)
GFR, Estimated: 30 mL/min — ABNORMAL LOW (ref >60)
Glucose, Bld: 93 mg/dL (ref 70–99)
Potassium: 3.7 mmol/L (ref 3.5–5.1)
Sodium: 137 mmol/L (ref 135–145)
Total Bilirubin: 0.8 mg/dL (ref 0.3–1.2)
Total Protein: 6.3 g/dL — ABNORMAL LOW (ref 6.5–8.1)

## 2021-12-01 LAB — CBC WITH DIFFERENTIAL/PLATELET
Abs Immature Granulocytes: 0.04 10*3/uL (ref 0.00–0.07)
Basophils Absolute: 0 10*3/uL (ref 0.0–0.1)
Basophils Relative: 0 %
Eosinophils Absolute: 0 10*3/uL (ref 0.0–0.5)
Eosinophils Relative: 0 %
HCT: 26.1 % — ABNORMAL LOW (ref 36.0–46.0)
Hemoglobin: 7.4 g/dL — ABNORMAL LOW (ref 12.0–15.0)
Immature Granulocytes: 1 %
Lymphocytes Relative: 11 %
Lymphs Abs: 0.7 10*3/uL (ref 0.7–4.0)
MCH: 25.4 pg — ABNORMAL LOW (ref 26.0–34.0)
MCHC: 28.4 g/dL — ABNORMAL LOW (ref 30.0–36.0)
MCV: 89.7 fL (ref 80.0–100.0)
Monocytes Absolute: 0.5 10*3/uL (ref 0.1–1.0)
Monocytes Relative: 7 %
Neutro Abs: 5.5 10*3/uL (ref 1.7–7.7)
Neutrophils Relative %: 81 %
Platelets: 242 10*3/uL (ref 150–400)
RBC: 2.91 MIL/uL — ABNORMAL LOW (ref 3.87–5.11)
RDW: 15.9 % — ABNORMAL HIGH (ref 11.5–15.5)
WBC: 6.8 10*3/uL (ref 4.0–10.5)
nRBC: 0 % (ref 0.0–0.2)

## 2021-12-01 LAB — GLUCOSE, CAPILLARY
Glucose-Capillary: 83 mg/dL (ref 70–99)
Glucose-Capillary: 157 mg/dL — ABNORMAL HIGH (ref 70–99)
Glucose-Capillary: 102 mg/dL — ABNORMAL HIGH (ref 70–99)
Glucose-Capillary: 126 mg/dL — ABNORMAL HIGH (ref 70–99)

## 2021-12-01 LAB — C DIFFICILE QUICK SCREEN W PCR REFLEX
C Diff antigen: NEGATIVE
C Diff interpretation: NOT DETECTED
C Diff toxin: NEGATIVE

## 2021-12-01 LAB — MRSA NEXT GEN BY PCR, NASAL: MRSA by PCR Next Gen: NOT DETECTED

## 2021-12-01 MED ORDER — VANCOMYCIN HCL IN DEXTROSE 1-5 GM/200ML-% IV SOLN
1000.0000 mg | INTRAVENOUS | Status: DC
Start: 2021-12-02 — End: 2021-12-02

## 2021-12-01 MED ORDER — FAMOTIDINE 20 MG PO TABS
20.0000 mg | ORAL_TABLET | Freq: Every day | ORAL | Status: DC
  Administered 2021-12-01 – 2021-12-03 (×3): 20 mg via ORAL
  Filled 2021-12-01 (×3): qty 1

## 2021-12-01 MED ORDER — LACTATED RINGERS IV BOLUS
250.0000 mL | Freq: Once | INTRAVENOUS | Status: AC
  Administered 2021-12-01: 250 mL via INTRAVENOUS

## 2021-12-01 MED ORDER — SODIUM CHLORIDE 0.9 % IV SOLN: INTRAVENOUS | Status: DC

## 2021-12-01 NOTE — Progress Notes (Signed)
Pharmacy Antibiotic Note ? ?Kayla [name] is a 81 y.o. female with history of cystitis. Presented to the ED on 11/30/21 from nursing home facility with fever and AMS. She was given ceftriaxone IM at nursing home facility for suspected UTI prior to admission. She was started on vancomycin and cefepime on admission for sepsis.  ? ?Today, 12/01/21: ?- Day #2 of abx ?-Tmax 100.68F ?-WBC WNL ?-Scr 1.69, down from 5/3 ?-Bcx have been negative thus far ? ?Plan: ?-Change vanc dose to '1000mg'$  q48h.  ?-Continue cefepime 2g q24h. ?-Monitor renal function and clinical status ? ?Height: '5\' 4"'$  (162.6 cm) ?Weight: 92.9 kg (204 lb 12.9 oz) ?IBW/kg (Calculated) : 54.7 ? ?Temp (24hrs), Avg:98.5 ?F (36.9 ?C), Min:97.7 ?F (36.5 ?C), Max:100.7 ?F (38.2 ?C) ? ?Recent Labs  ?Lab 11/30/21 ?1233 11/30/21 ?1234 12/01/21 ?0359  ?WBC  --  11.3* 6.8  ?CREATININE  --  2.36* 1.69*  ?LATICACIDVEN 1.3  --   --   ?  ?Estimated Creatinine Clearance: 27.9 mL/min (A) (by C-G formula based on SCr of 1.69 mg/dL (H)).   ? ?No Known Allergies ? ?5/3 Vanc >>   ?5/3 Cefepime >>   ?  ?5/3 BCx x2: ? ?Thank you for allowing pharmacy to be a part of this patient?s care. ? ?Rickey Barbara ?12/01/2021 12:54 PM ? ?

## 2021-12-01 NOTE — Progress Notes (Signed)
?PROGRESS NOTE ? ?Kayla [name]  ?DOB: 20-Jan-1941  ?PCP: Kayla Lung, MD ?OEU:[id number]  ?DOA: 11/30/2021 ? LOS: 0 days  ?Hospital Day: 2 ? ?Brief narrative: ?Kayla [name] is a 81 y.o. female with PMH significant for DM2, HTN, HLD, obesity, PE, chronic A-fib,, osteoarthritis lumbar nerve root impingement, facial droop, chronic bilateral foot wounds. ?Patient was sent to the ED from Kayla [name] on 5/3 with complaint of fever of 102, altered mental status and impaired mobility.  Per report, she was recently started on a daily ceftriaxone IM regimen at the facility for presumptive UTI.   ? ?In the ED, patient was afebrile, heart rate in 90s, blood pressure low in 80s ?Blood culture and urine culture sent. ?Patient was started on IV broad-spectrum antibiotics and IV fluid. ?Labs showed a CBC with WBC of 11.3, hemoglobin 8, lactic acid level normal.  BUN/creatinine 56/2.36, baseline creatinine less than 1. ?Chest x-ray unremarkable ?CT head did not show acute intracranial abnormality. ? ?Subjective: ?Patient was seen and examined this morning.  Pleasant elderly African-American female.  Propped up in bed.  Not in distress.  Slow to respond.  Oriented to place and person.  No family at bedside. ?Per RN, patient is having loose stool.  GI pathogen panel and C diff assay ordered. ?Chart reviewed ?Tmax 100.7 last night ?Labs this morning with WBC improving to 6.8, hemoglobin lower at 7.4, creatinine improving to 1.69 ? ?Principal Problem: ?  AKI (acute kidney injury) ([place]) ?Active Problems: ?  Class 2 obesity due to excess calories with body mass index (BMI) of 37.0 to 37.9 in adult ?  History of pulmonary embolus (PE) ?  Hyperlipidemia associated with type 2 diabetes mellitus ([place]) ?  Primary hypertension ?  Diabetic neuropathy, type II diabetes mellitus ([place]) ?  Gastroesophageal reflux disease without esophagitis ?  Anemia due to chronic blood loss ?  Generalized weakness ?  Unspecified atrial fibrillation ([place]) ?   Protein-calorie malnutrition, severe ([place]) ?  Sepsis due to undetermined organism [facility]) ?  Pressure injury of skin ?  ? ?Assessment and Plan: ?Sepsis likely secondary to UTI- POA ?-Per report, patient was being treated for UTI at the facility with IM Rocephin. ?-Presented with leukocytosis, tachypnea, mounted a fever after few hours of presentation. ?-Blood culture, urine culture sent. ?-Currently on IV cefepime and vancomycin. ?-Continue to monitor ?-I had a long conversation with patient's son Mr. Kayla [name] this afternoon.  He wants her to be started on long-term prophylactic antibiotics because she has had UTI 3-4 times in last 3 months only.  Can consider Keflex 250 mg daily for long-term prophylaxis after this course of antibiotics is over. ?Recent Labs  ?Lab 11/30/21 ?1233 11/30/21 ?1234 12/01/21 ?0359  ?WBC  --  11.3* 6.8  ?LATICACIDVEN 1.3  --   --   ? ?Acute on chronic diarrhea ?-Episodes of loose bowel movement noticed in last 24 hours.  Per patient's son, she has had intermittent episodes of diarrhea in the past as well. ?-GI pathogen panel and C. difficile assay sent. ? ?AKI ?-Her baseline creatinine was normal in January.  Elevated creatinine elevated 2.36.  Improving on IV fluid.  Continue to monitor. ?Recent Labs  ?  06/28/21 ?0059 06/29/21 ?1540 06/30/21 ?0121 08/25/21 ?2237 08/26/21 ?1352 08/27/21 ?0203 08/28/21 ?0418 11/02/21 ?1758 11/30/21 ?1234 12/01/21 ?0359  ?BUN 25* 26* 31* '17 16 17 15 '$ 62* 56* 54*  ?CREATININE 0.77 0.84 0.80 0.88 0.84 0.90 0.76 1.38* 2.36* 1.69*  ? ?Hypotension ?-Blood pressure  was low in 80s at presentation. ?-BP meds on hold including HCTZ, losartan ?-Continue to monitor blood pressure. ? ?Chronic A-fib ?-Continue Coreg 6.25 mg twice daily and Eliquis 5 mg twice daily. ? ?Type 2 diabetes mellitus ?Diabetic neuropathy ?-A1c 6.4 in January 2023 ?-Home meds include metformin 500 mg twice daily.  Currently on hold. ?-Continue sliding-scale insulin Accu-Cheks ?-Continue gabapentin  twice daily for neuropathy ?Recent Labs  ?Lab 12/01/21 ?0728 12/01/21 ?1125  ?GLUCAP 83 157*  ? ?HLD ?-Continue Lipitor 80 mg and Zetia 10 mg at bedtime ? ?History of pulmonary embolus ?-On apixaban. ? ?GERD ? ?Anemia of chronic disease ?-Low baseline hemoglobin seems to between 8 and 9.  No active bleeding.  ?-Continue iron supplement ?-continue to monitor. ?Recent Labs  ?  06/26/21 ?0123 06/27/21 ?0254 08/27/21 ?0203 08/28/21 ?0418 11/02/21 ?1758 11/30/21 ?1234 12/01/21 ?0359  ?HGB 7.7*   < > 9.7* 8.8* 9.3* 8.0* 7.4*  ?MCV 81.4   < > 82.5 82.2 81.7 82.7 89.7  ?VITAMINB12 224  --   --  402  --   --   --   ?FOLATE 7.3  --   --  12.1  --   --   --   ?FERRITIN 72  --   --  52  --   --   --   ?TIBC 211*  --   --  235*  --   --   --   ?IRON 42  --   --  43  --   --   --   ?RETICCTPCT 1.1  --   --  1.1  --   --   --   ? < > = values in this interval not displayed.  ? ?Generalized weakness, impaired mobility ?History of osteoarthritis, lumbar nerve impingement ?-Mobility further impaired in the setting of acute illness.  ?-PT eval pending. ? ?Goals of care ?  Code Status: Full Code  ? ? ?Mobility: Pending PT eval ? ?Skin assessment:  ?Pressure Injury 11/30/21 Heel Left Stage 2 -  Partial thickness loss of dermis presenting as a shallow open injury with a red, pink wound bed without slough. partially healed stg 2 PI (Active)  ?11/30/21 1814  ?Location: Heel  ?Location Orientation: Left  ?Staging: Stage 2 -  Partial thickness loss of dermis presenting as a shallow open injury with a red, pink wound bed without slough.  ?Wound Description (Comments): partially healed stg 2 PI  ?Present on Admission: Yes  ? ? ?Nutritional status:  ?Body mass index is 35.16 kg/m?.  ?  ?  ? ? ? ? ?Diet:  ?Diet Order   ? ?       ?  Diet heart healthy/carb modified Room service appropriate? Yes; Fluid consistency: Thin  Diet effective now       ?  ? ?  ?  ? ?  ? ? ?DVT prophylaxis:  ? ?apixaban (ELIQUIS) tablet 5 mg   ?Antimicrobials: IV cefepime  and IV vancomycin ?Fluid: NS at 75 mill per hour ?Consultants: None ?Family Communication: Called and updated patient's son Mr. Kayla [name] this afternoon ? ?Status is: Observation ? ?Continue in-hospital care because: Ongoing treatment for UTI, diarrhea, sepsis ?Level of care: Progressive  ? ?Dispo: The patient is from: [place] [facility] facility ?             Anticipated d/c is to: Back to nursing facility ?             Patient currently is not  medically stable to d/c. ?  Difficult to place patient No ? ? ? ? ?Infusions:  ? sodium chloride    ? ceFEPime (MAXIPIME) IV    ? [START ON 12/02/2021] vancomycin    ? ? ?Scheduled Meds: ? apixaban  5 mg Oral BID  ? ascorbic acid  500 mg Oral BID  ? atorvastatin  80 mg Oral QHS  ? carvedilol  6.25 mg Oral BID  ? docusate sodium  100 mg Oral q morning  ? ezetimibe  10 mg Oral QHS  ? famotidine  20 mg Oral Daily  ? ferrous sulfate  325 mg Oral BID PC  ? fluticasone  1 spray Each Nare QHS  ? gabapentin  100 mg Oral BID  ? insulin aspart  0-9 Units Subcutaneous TID WC  ? lidocaine  2 patch Transdermal Q24H  ? loratadine  10 mg Oral QHS  ? ? ?PRN meds: ?acetaminophen **OR** acetaminophen, melatonin, Muscle Rub, nitroGLYCERIN, ondansetron **OR** ondansetron (ZOFRAN) IV  ? ?Antimicrobials: ?Anti-infectives (From admission, onward)  ? ? Start     Dose/Rate Route Frequency Ordered Stop  ? 12/02/21 2300  vancomycin (VANCOCIN) IVPB 1000 mg/200 mL premix       ? 1,000 mg ?200 mL/hr over 60 Minutes Intravenous Every 48 hours 12/01/21 1317    ? 12/01/21 1400  ceFEPIme (MAXIPIME) 2 g in sodium chloride 0.9 % 100 mL IVPB       ? 2 g ?200 mL/hr over 30 Minutes Intravenous Daily 11/30/21 1540    ? 11/30/21 2100  vancomycin (VANCOREADY) IVPB 750 mg/150 mL  Status:  Discontinued       ? 750 mg ?150 mL/hr over 60 Minutes Intravenous Every 48 hours 11/30/21 1956 12/01/21 1316  ? 11/30/21 1300  vancomycin (VANCOCIN) IVPB 1000 mg/200 mL premix       ? 1,000 mg ?200 mL/hr over 60 Minutes Intravenous   Once 11/30/21 1253 11/30/21 1455  ? 11/30/21 1300  ceFEPIme (MAXIPIME) 2 g in sodium chloride 0.9 % 100 mL IVPB       ? 2 g ?200 mL/hr over 30 Minutes Intravenous  Once 11/30/21 1253 11/30/21 1357  ? ?

## 2021-12-02 DIAGNOSIS — N179 Acute kidney failure, unspecified: Secondary | ICD-10-CM | POA: Diagnosis not present

## 2021-12-02 LAB — URINALYSIS, ROUTINE W REFLEX MICROSCOPIC
Bilirubin Urine: NEGATIVE
Glucose, UA: NEGATIVE mg/dL
Hgb urine dipstick: NEGATIVE
Ketones, ur: NEGATIVE mg/dL
Nitrite: NEGATIVE
Protein, ur: NEGATIVE mg/dL
Specific Gravity, Urine: 1.013 (ref 1.005–1.030)
WBC, UA: >50 WBC/hpf — ABNORMAL HIGH (ref 0–5)
pH: 5 (ref 5.0–8.0)

## 2021-12-02 LAB — GASTROINTESTINAL PANEL BY PCR, STOOL (REPLACES STOOL CULTURE)

## 2021-12-02 LAB — CBC WITH DIFFERENTIAL/PLATELET
Abs Immature Granulocytes: 0.05 10*3/uL (ref 0.00–0.07)
Basophils Absolute: 0 10*3/uL (ref 0.0–0.1)
Basophils Relative: 0 %
Eosinophils Absolute: 0.2 10*3/uL (ref 0.0–0.5)
Eosinophils Relative: 2 %
HCT: 24.1 % — ABNORMAL LOW (ref 36.0–46.0)
Hemoglobin: 7.5 g/dL — ABNORMAL LOW (ref 12.0–15.0)
Immature Granulocytes: 1 %
Lymphocytes Relative: 18 %
Lymphs Abs: 1.2 10*3/uL (ref 0.7–4.0)
MCH: 25.6 pg — ABNORMAL LOW (ref 26.0–34.0)
MCHC: 31.1 g/dL (ref 30.0–36.0)
MCV: 82.3 fL (ref 80.0–100.0)
Monocytes Absolute: 0.7 10*3/uL (ref 0.1–1.0)
Monocytes Relative: 10 %
Neutro Abs: 4.6 10*3/uL (ref 1.7–7.7)
Neutrophils Relative %: 69 %
Platelets: 252 10*3/uL (ref 150–400)
RBC: 2.93 MIL/uL — ABNORMAL LOW (ref 3.87–5.11)
RDW: 15.4 % (ref 11.5–15.5)
WBC: 6.7 10*3/uL (ref 4.0–10.5)
nRBC: 0 % (ref 0.0–0.2)

## 2021-12-02 LAB — GLUCOSE, CAPILLARY
Glucose-Capillary: 103 mg/dL — ABNORMAL HIGH (ref 70–99)
Glucose-Capillary: 142 mg/dL — ABNORMAL HIGH (ref 70–99)
Glucose-Capillary: 117 mg/dL — ABNORMAL HIGH (ref 70–99)
Glucose-Capillary: 137 mg/dL — ABNORMAL HIGH (ref 70–99)

## 2021-12-02 LAB — BASIC METABOLIC PANEL
Anion gap: 9 (ref 5–15)
BUN: 40 mg/dL — ABNORMAL HIGH (ref 8–23)
CO2: 23 mmol/L (ref 22–32)
Calcium: 7.6 mg/dL — ABNORMAL LOW (ref 8.9–10.3)
Chloride: 106 mmol/L (ref 98–111)
Creatinine, Ser: 0.95 mg/dL (ref 0.44–1.00)
GFR, Estimated: 59 mL/min — ABNORMAL LOW (ref >60)
Glucose, Bld: 102 mg/dL — ABNORMAL HIGH (ref 70–99)
Potassium: 3.4 mmol/L — ABNORMAL LOW (ref 3.5–5.1)
Sodium: 138 mmol/L (ref 135–145)

## 2021-12-02 MED ORDER — VANCOMYCIN HCL 1750 MG/350ML IV SOLN
1750.0000 mg | INTRAVENOUS | Status: DC
  Administered 2021-12-02: 1750 mg via INTRAVENOUS
  Filled 2021-12-02: qty 350

## 2021-12-02 MED ORDER — SODIUM CHLORIDE 0.9 % IV SOLN
2.0000 g | Freq: Two times a day (BID) | INTRAVENOUS | Status: DC
  Administered 2021-12-02: 2 g via INTRAVENOUS
  Filled 2021-12-02 (×2): qty 12.5

## 2021-12-02 MED ORDER — GABAPENTIN 100 MG PO CAPS
100.0000 mg | ORAL_CAPSULE | Freq: Every day | ORAL | Status: DC
  Administered 2021-12-03: 100 mg via ORAL
  Filled 2021-12-02: qty 1

## 2021-12-02 NOTE — [place] FL2 (Signed)
?[place] MEDICAID FL2 LEVEL OF CARE SCREENING TOOL  ?  ? ?IDENTIFICATION  ?Patient Name: ?Kayla [name] Birthdate: [birth year] Sex: female Admission Date (Current Location): ?11/30/2021  ?[place] and [place] Number: ? [place] ?  Facility and Address:  ?[facility],  [place], [place] ?     Provider Number: ?[id number]  ?Attending Physician Name and Address:  ?Nita Sells, MD ? Relative Name and Phone Number:  ?  ?   ?Current Level of Care: ?Hospital Recommended Level of Care: ?[place] Prior Approval Number: ?  ? ?Date Approved/Denied: ?  PASRR Number: ?[id number] A ? ?Discharge Plan: ?SNF ?  ? ?Current Diagnoses: ?Patient Active Problem List  ? Diagnosis Date Noted  ? Pressure injury of skin 12/01/2021  ? AKI (acute kidney injury) ([place]) 11/30/2021  ? Protein-calorie malnutrition, severe ([place]) 11/30/2021  ? Sepsis due to undetermined organism ([place]) 11/30/2021  ? Acute cystitis without hematuria 08/26/2021  ? COVID-19 virus infection 08/26/2021  ? Facial droop 08/26/2021  ? Pressure ulcer of left heel 08/26/2021  ? Acute metabolic encephalopathy 57/84/6962  ? Unspecified atrial fibrillation ([place]) 06/24/2021  ? Spinal stenosis of lumbar region at multiple levels 06/24/2021  ? Lumbar nerve root impingement 06/24/2021  ? Generalized weakness 06/23/2021  ? Ambulatory dysfunction 06/23/2021  ? Spinal stenosis 06/23/2021  ? Atrial flutter ([place]) 03/06/2021  ? Atrial flutter with rapid ventricular response ([place]) 03/05/2021  ? Type 2 diabetes mellitus ([place]) 03/05/2021  ? Elevated troponin   ? Anemia due to chronic blood loss 08/30/2020  ? Constipation 08/30/2020  ? Feces contents abnormal 08/30/2020  ? Hemorrhoids 08/30/2020  ? Proctalgia 08/30/2020  ? Rectal bleeding 08/30/2020  ? Lymphedema 12/23/2019  ? Arthritis 12/18/2018  ? Fatigue 07/09/2018  ? Gastroesophageal reflux disease without esophagitis 04/16/2018  ? Dependent edema 01/01/2017  ? Anemia of chronic illness  02/15/2015  ? Multiple falls 02/15/2015  ? Diabetic neuropathy, type II diabetes mellitus ([place]) 09/09/2012  ? Hyperlipidemia associated with type 2 diabetes mellitus ([place]) 05/22/2011  ? Primary hypertension 05/22/2011  ? Class 2 obesity due to excess calories with body mass index (BMI) of 37.0 to 37.9 in adult 12/14/2010  ? History of pulmonary embolus (PE) 12/14/2010  ? ? ?Orientation RESPIRATION BLADDER Height & Weight   ?  ?Self, Time, Situation, Place ? Normal Continent Weight: 92.9 kg ?Height:  '5\' 4"'$  (162.6 cm)  ?BEHAVIORAL SYMPTOMS/MOOD NEUROLOGICAL BOWEL NUTRITION STATUS  ?    Continent Diet (regular)  ?AMBULATORY STATUS COMMUNICATION OF NEEDS Skin   ?Extensive Assist Verbally Normal ?  ?  ?  ?    ?     ?     ? ? ?Personal Care Assistance Level of Assistance  ?Bathing, Feeding, Dressing Bathing Assistance: Limited assistance ?Feeding assistance: Limited assistance ?Dressing Assistance: Limited assistance ?   ? ?Functional Limitations Info  ?Sight, Speech, Hearing Sight Info: Adequate ?Hearing Info: Adequate ?Speech Info: Adequate  ? ? ?SPECIAL CARE FACTORS FREQUENCY  ?PT (By licensed PT), OT (By licensed OT)   ?  ?PT Frequency: 5x weekly ?OT Frequency: 5 x weekly ?  ?  ?  ?   ? ? ?Contractures Contractures Info: Not present  ? ? ?Additional Factors Info  ?Code Status Code Status Info: full ?  ?  ?  ?  ?   ? ?Current Medications (12/02/2021):  This is the current hospital active medication list ?Current Facility-Administered Medications  ?Medication Dose Route Frequency Provider Last Rate Last Admin  ?  0.9 %  sodium chloride infusion   Intravenous Continuous Dahal, Marlowe Aschoff, MD 75 mL/hr at 12/01/21 1751 Infusion Verify at 12/01/21 1751  ? acetaminophen (TYLENOL) tablet 650 mg  650 mg Oral Q6H PRN Reubin Milan, MD   650 mg at 11/30/21 2226  ? Or  ? acetaminophen (TYLENOL) suppository 650 mg  650 mg Rectal Q6H PRN Reubin Milan, MD      ? apixaban Arne [place]) tablet 5 mg  5 mg Oral BID Reubin Milan, MD   5 mg at 12/02/21 4098  ? ascorbic acid (VITAMIN C) tablet 500 mg  500 mg Oral BID Reubin Milan, MD   500 mg at 12/02/21 1191  ? atorvastatin (LIPITOR) tablet 80 mg  80 mg Oral QHS Reubin Milan, MD   80 mg at 12/01/21 2133  ? carvedilol (COREG) tablet 6.25 mg  6.25 mg Oral BID Reubin Milan, MD   6.25 mg at 12/02/21 4782  ? ceFEPIme (MAXIPIME) 2 g in sodium chloride 0.9 % 100 mL IVPB  2 g Intravenous Q1400 Reubin Milan, MD   Stopped at 12/01/21 1524  ? docusate sodium (COLACE) capsule 100 mg  100 mg Oral q morning Reubin Milan, MD   100 mg at 12/01/21 1009  ? ezetimibe (ZETIA) tablet 10 mg  10 mg Oral QHS Reubin Milan, MD   10 mg at 12/01/21 2135  ? famotidine (PEPCID) tablet 20 mg  20 mg Oral Daily Reubin Milan, MD   20 mg at 12/02/21 0846  ? ferrous sulfate tablet 325 mg  325 mg Oral BID PC Reubin Milan, MD   325 mg at 12/02/21 9562  ? fluticasone (FLONASE) 50 MCG/ACT nasal spray 1 spray  1 spray Each Nare QHS Reubin Milan, MD      ? gabapentin (NEURONTIN) capsule 100 mg  100 mg Oral BID Reubin Milan, MD   100 mg at 12/02/21 1308  ? insulin aspart (novoLOG) injection 0-9 Units  0-9 Units Subcutaneous TID WC Reubin Milan, MD   0 Units at 12/01/21 1246  ? lidocaine (LIDODERM) 5 % 2 patch  2 patch Transdermal Q24H Reubin Milan, MD   2 patch at 12/01/21 1654  ? loratadine (CLARITIN) tablet 10 mg  10 mg Oral QHS Reubin Milan, MD   10 mg at 12/01/21 2135  ? melatonin tablet 5 mg  5 mg Oral QHS PRN Reubin Milan, MD      ? Muscle Rub CREA   Topical TID PRN Reubin Milan, MD      ? nitroGLYCERIN (NITROSTAT) SL tablet 0.4 mg  0.4 mg Sublingual Q5 min PRN Reubin Milan, MD      ? ondansetron [facility]) tablet 4 mg  4 mg Oral Q6H PRN Reubin Milan, MD      ? Or  ? ondansetron [facility]) injection 4 mg  4 mg Intravenous Q6H PRN Reubin Milan, MD      ? vancomycin (VANCOCIN) IVPB 1000 mg/200 mL premix   1,000 mg Intravenous Q48H Rickey Barbara, Student-PharmD      ? ? ? ?Discharge Medications: ?Please see discharge summary for a list of discharge medications. ? ?Relevant Imaging Results: ? ?Relevant Lab Results: ? ? ?Additional Information ?SSN [id number] ? ?Leeroy Cha, RN ? ? ? ? ?

## 2021-12-02 NOTE — Progress Notes (Signed)
Pharmacy Antibiotic Note ? ?Kayla [name] is a 81 y.o. female with history of cystitis. Presented to the ED on 11/30/21 from nursing home facility with fever and AMS. She was given ceftriaxone IM at nursing home facility for suspected UTI prior to admission. She was started on vancomycin and cefepime on admission for sepsis.  ? ? ?Plan: ?-Change vanc dose to '1750mg'$  q48h.  ?-Change cefepime 2g to q12h. ?-Monitor renal function and clinical status ? ?Height: '5\' 4"'$  (162.6 cm) ?Weight: 92.9 kg (204 lb 12.9 oz) ?IBW/kg (Calculated) : 54.7 ? ?Temp (24hrs), Avg:98.1 ?F (36.7 ?C), Min:98 ?F (36.7 ?C), Max:98.2 ?F (36.8 ?C) ? ?Recent Labs  ?Lab 11/30/21 ?1233 11/30/21 ?1234 12/01/21 ?0359 12/02/21 ?0351  ?WBC  --  11.3* 6.8 6.7  ?CREATININE  --  2.36* 1.69* 0.95  ?LATICACIDVEN 1.3  --   --   --   ? ?  ?Estimated Creatinine Clearance: 49.6 mL/min (by C-G formula based on SCr of 0.95 mg/dL).   ? ?No Known Allergies ? ?5/3 Vanc >>   ?5/3 Cefepime >>   ?  ?5/3 BCx x2: ngtd ? ?Thank you for allowing pharmacy to be a part of this patient?s care. ? ?Tawnya Crook, PharmD, BCPS ?Clinical Pharmacist ?12/02/2021 3:00 PM ? ? ?

## 2021-12-02 NOTE — Evaluation (Signed)
Physical Therapy Evaluation ?Patient Details ?Name: Kayla [name] ?MRN: [id number] ?DOB: 10/27/1940 ?Today's Date: 12/02/2021 ? ?History of Present Illness ? 81 y.o. female with PMH significant for DM2, HTN, HLD, obesity, PE, chronic A-fib,, osteoarthritis lumbar nerve root impingement, facial droop, chronic bilateral foot wounds, covid January 2023.  Patient was sent to the ED from [facility] on 5/3 with complaint of fever of 102, altered mental status and impaired mobility. Dx of UTI, sepsis.  ?Clinical Impression ? Pt admitted with above diagnosis. +2 total assist for rolling L & R for pericare and linen change. Attempted supine to sit with +2 total assist, however pt was physically resistant, she was unable to come to full upright sitting position. Pt is confused, did not want physical assistance with bed mobility but could not perform it herself. Unclear what her baseline is as she is oriented to self only. Pt currently with functional limitations due to the deficits listed below (see PT Problem List). Pt will benefit from skilled PT to increase their independence and safety with mobility to allow discharge to the venue listed below.   ?   ?   ? ?Recommendations for follow up therapy are one component of a multi-disciplinary discharge planning process, led by the attending physician.  Recommendations may be updated based on patient status, additional functional criteria and insurance authorization. ? ?Follow Up Recommendations Skilled nursing-short term rehab (<3 hours/day) ? ?  ?Assistance Recommended at Discharge Frequent or constant Supervision/Assistance  ?Patient can return home with the following ? Two people to help with walking and/or transfers;Two people to help with bathing/dressing/bathroom;Direct supervision/assist for medications management;Assistance with cooking/housework;Help with stairs or ramp for entrance;Assist for transportation ? ?  ?Equipment Recommendations    ?Recommendations for Other  Services ?    ?  ?Functional Status Assessment Patient has had a recent decline in their functional status and demonstrates the ability to make significant improvements in function in a reasonable and predictable amount of time.  ? ?  ?Precautions / Restrictions Precautions ?Precautions: Fall ?Restrictions ?Weight Bearing Restrictions: No  ? ?  ? ?Mobility ? Bed Mobility ?Overal bed mobility: Needs Assistance ?Bed Mobility: Rolling, Sidelying to Sit ?Rolling: Total assist, +2 for physical assistance ?Sidelying to sit: Total assist, +2 for physical assistance ?  ?  ?  ?General bed mobility comments: rolled L and R x 2 for pericare and linen change, attempted supine to sit but pt physically resistant and was pushing posteriorly with trunk, unable to achieve full upright position. Pt did not want assistance with mobility but was unable to perform it herself. "Why are you doing this to me?" ?  ? ?Transfers ?  ?  ?  ?  ?  ?  ?  ?  ?  ?  ?  ? ?Ambulation/Gait ?  ?  ?  ?  ?  ?  ?  ?  ? ?Stairs ?  ?  ?  ?  ?  ? ?Wheelchair Mobility ?  ? ?Modified Rankin (Stroke Patients Only) ?  ? ?  ? ?Balance   ?  ?  ?  ?  ?  ?  ?  ?  ?  ?  ?  ?  ?  ?  ?  ?  ?  ?  ?   ? ? ? ?Pertinent Vitals/Pain Pain Assessment ?Pain Assessment: No/denies pain  ? ? ?Home Living Family/patient expects to be discharged to:: Skilled nursing facility ?  ?  ?  ?  ?  ?  ?  ?  ?  ?  Additional Comments: pt is from SNF  ?  ?Prior Function Prior Level of Function : Needs assist;Patient poor historian/Family not available ?  ?  ?  ?  ?  ?  ?Mobility Comments: pt confused, not able to provide PLOF ?  ?  ? ? ?Hand Dominance  ? Dominant Hand: Right ? ?  ?Extremity/Trunk Assessment  ? Upper Extremity Assessment ?Upper Extremity Assessment: Difficult to assess due to impaired cognition ?  ? ?Lower Extremity Assessment ?Lower Extremity Assessment: Difficult to assess due to impaired cognition ?  ? ?   ?Communication  ? Communication: No difficulties  ?Cognition  Arousal/Alertness: Awake/alert ?Behavior During Therapy: Anxious ?Overall Cognitive Status: No family/caregiver present to determine baseline cognitive functioning ?  ?  ?  ?  ?  ?  ?  ?  ?  ?  ?  ?  ?  ?  ?  ?  ?General Comments: oriented to self only, multiple complaints about staff "grabbing" her during clean up and linen change ?  ?  ? ?  ?General Comments   ? ?  ?Exercises    ? ?Assessment/Plan  ?  ?PT Assessment Patient needs continued PT services  ?PT Problem List Decreased strength;Decreased mobility;Decreased activity tolerance;Decreased cognition ? ?   ?  ?PT Treatment Interventions Therapeutic exercise;Therapeutic activities;Functional mobility training;Patient/family education   ? ?PT Goals (Current goals can be found in the Care Plan section)  ?Acute Rehab PT Goals ?PT Goal Formulation: Patient unable to participate in goal setting ?Time For Goal Achievement: 12/16/21 ?Potential to Achieve Goals: Fair ? ?  ?Frequency Min 2X/week ?  ? ? ?Co-evaluation   ?  ?  ?  ?  ? ? ?  ?AM-PAC PT "6 Clicks" Mobility  ?Outcome Measure Help needed turning from your back to your side while in a flat bed without using bedrails?: Total ?Help needed moving from lying on your back to sitting on the side of a flat bed without using bedrails?: Total ?Help needed moving to and from a bed to a chair (including a wheelchair)?: Total ?Help needed standing up from a chair using your arms (e.g., wheelchair or bedside chair)?: Total ?Help needed to walk in hospital room?: Total ?Help needed climbing 3-5 steps with a railing? : Total ?6 Click Score: 6 ? ?  ?End of Session   ?Activity Tolerance: Treatment limited secondary to agitation ?Patient left: with bed alarm set;in bed;with call bell/phone within reach ?Nurse Communication: Mobility status;Need for lift equipment ?PT Visit Diagnosis: Muscle weakness (generalized) (M62.81);Difficulty in walking, not elsewhere classified (R26.2) ?  ? ?Time: 0981-1914 ?PT Time Calculation (min)  (ACUTE ONLY): 16 min ? ? ?Charges:   PT Evaluation ?$PT Eval Moderate Complexity: 1 Mod ?  ?  ?   ? ?Blondell Reveal Kistler PT 12/02/2021  ?Acute Rehabilitation Services ?Pager [phone] ?Office [phone] ? ? ?

## 2021-12-02 NOTE — Plan of Care (Signed)
  Problem: Clinical Measurements: Goal: Ability to maintain clinical measurements within normal limits will improve Outcome: Progressing Goal: Will remain free from infection Outcome: Progressing   Problem: Nutrition: Goal: Adequate nutrition will be maintained Outcome: Progressing   

## 2021-12-02 NOTE — Progress Notes (Signed)
?PROGRESS NOTE ? ?Kayla [name]  ?DOB: 12-23-1940  ?PCP: Denita Lung, MD ?VOJ:[id number]  ?DOA: 11/30/2021 ? LOS: 1 day  ?Hospital Day: 3 ? ?Brief narrative: ?39 [place] resident DM2, HTN, HLD, obesity, PE, chronic A-fib,, osteoarthritis lumbar nerve root impingement, facial droop, chronic bilateral foot wounds. ?Patient was sent to the ED from [facility] on 5/3 with complaint of fever of 102, altered mental status and impaired mobility.  Per report, she was recently started on a daily ceftriaxone IM regimen at the facility for presumptive UTI.   ? ?In the ED, patient was afebrile, heart rate in 90s, blood pressure low in 80s ?Blood culture and urine culture sent. ?Patient was started on IV broad-spectrum antibiotics and IV fluid. ?Labs showed a CBC with WBC of 11.3, hemoglobin 8, lactic acid level normal.  BUN/creatinine 56/2.36, baseline creatinine less than 1. ?Chest x-ray unremarkable ?CT head did not show acute intracranial abnormality. ? ?Subjective: ? ?Slow speech doing fair cannot recall her breakfast but tells me she might of had fish for lunch ?No chest pain no other issues but review of system is unreliable because of her underlying cognitive issues ? ? ? ?Principal Problem: ?  AKI (acute kidney injury) ([place]) ?Active Problems: ?  Class 2 obesity due to excess calories with body mass index (BMI) of 37.0 to 37.9 in adult ?  History of pulmonary embolus (PE) ?  Hyperlipidemia associated with type 2 diabetes mellitus ([place]) ?  Primary hypertension ?  Diabetic neuropathy, type II diabetes mellitus ([place]) ?  Gastroesophageal reflux disease without esophagitis ?  Anemia due to chronic blood loss ?  Generalized weakness ?  Unspecified atrial fibrillation ([place]) ?  Protein-calorie malnutrition, severe ([place]) ?  Sepsis due to undetermined organism [facility]) ?  Pressure injury of skin ?  ? ?Assessment and Plan: ?Sepsis likely secondary to UTI- POA ?-Per report, patient was being treated for UTI at the facility with  IM Rocephin.,  BCx 2, UCx from 5/5 pending ?-Continue cefepime vancomycin at this time and narrow in the next 24 hours based on culture data ?-May want to use suppressive therapy once acute infection is treated ? ?Acute on chronic diarrhea ?-Episodes of loose bowel movement noticed in last 24 hours.-C. difficile/pathogen panel negative-discontinue contact ? ?AKI ?-Her baseline creatinine was normal in January.  Elevated creatinine elevated 2.36.  Improving steadily at this time-continuing saline 75 cc/H ? ?Hypotension ?-Blood pressure was low in 80s at presentation.  Hold for over losartan, consider every other day Lasix-now on Coreg 6.25 twice daily for blood pressure control ? ?Chronic A-fib ?-Continue Coreg 6.25 mg twice daily and Eliquis 5 mg twice daily. ? ?Type 2 diabetes mellitus ?Diabetic neuropathy ?-A1c 6.4 in January 2023 ?-Metformin 500 twice daily discontinued on admission-would allow for less stringent control given advanced age and may change this to glipizide  ?cut back gabapentin to once daily Secondary to AKI and risk of encephalopathy ? ?HLD ?-Continue Lipitor 80 mg and Zetia 10 mg at bedtime ? ?History of pulmonary embolus ?-On apixaban. ? ?GERD ? ?Anemia of chronic disease ?-Low baseline hemoglobin seems to between 8 and 9.  No active bleeding.  5/5 recommend skilled ? ?Generalized weakness, impaired mobility ?History of osteoarthritis, lumbar nerve impingement ?-Mobility further impaired in the setting of acute illness.  ?-PT eval 5/5 recommends skilled ? ?Goals of care ?  Code Status: Full Code  ? ? ?Mobility: Pending PT eval ? ?Skin assessment:  ?Pressure Injury 11/30/21 Heel Left Stage  2 -  Partial thickness loss of dermis presenting as a shallow open injury with a red, pink wound bed without slough. partially healed stg 2 PI (Active)  ?11/30/21 1814  ?Location: Heel  ?Location Orientation: Left  ?Staging: Stage 2 -  Partial thickness loss of dermis presenting as a shallow open injury with a  red, pink wound bed without slough.  ?Wound Description (Comments): partially healed stg 2 PI  ?Present on Admission: Yes  ?   ?Pressure Injury 12/02/21 Sacrum Stage 2 -  Partial thickness loss of dermis presenting as a shallow open injury with a red, pink wound bed without slough. (Active)  ?12/02/21 1209  ?Location: Sacrum  ?Location Orientation:   ?Staging: Stage 2 -  Partial thickness loss of dermis presenting as a shallow open injury with a red, pink wound bed without slough.  ?Wound Description (Comments):   ?Present on Admission:   ? ? ?Nutritional status:  ?Body mass index is 35.16 kg/m?.  ?  ?  ? ? ? ? ?Diet:  ?Diet Order   ? ?       ?  Diet heart healthy/carb modified Room service appropriate? No; Fluid consistency: Thin  Diet effective now       ?  ? ?  ?  ? ?  ? ? ?DVT prophylaxis:  ? ?apixaban (ELIQUIS) tablet 5 mg   ?Antimicrobials: IV cefepime and IV vancomycin ?Fluid: NS at 75 mill per hour ?Consultants: None ?Family Communication: Called and updated patient's son Mr. [name] this afternoon ? ?Status is: Observation ? ?Continue in-hospital care because: Ongoing treatment for UTI, diarrhea, sepsis ?Level of care: Progressive  ? ?Dispo: The patient is from: [place] facility ?             Anticipated d/c is to: Back to nursing facility ?             Patient currently is not medically stable to d/c. ?  Difficult to place patient No ? ? ? ? ?Infusions:  ? sodium chloride 75 mL/hr at 12/01/21 1751  ? ceFEPime (MAXIPIME) IV    ? vancomycin    ? ? ?Scheduled Meds: ? apixaban  5 mg Oral BID  ? ascorbic acid  500 mg Oral BID  ? atorvastatin  80 mg Oral QHS  ? carvedilol  6.25 mg Oral BID  ? docusate sodium  100 mg Oral q morning  ? ezetimibe  10 mg Oral QHS  ? famotidine  20 mg Oral Daily  ? ferrous sulfate  325 mg Oral BID PC  ? fluticasone  1 spray Each Nare QHS  ? [START ON 12/03/2021] gabapentin  100 mg Oral Daily  ? insulin aspart  0-9 Units Subcutaneous TID WC  ? lidocaine  2 patch Transdermal  Q24H  ? loratadine  10 mg Oral QHS  ? ? ?PRN meds: ?acetaminophen **OR** acetaminophen, melatonin, Muscle Rub, nitroGLYCERIN, ondansetron **OR** ondansetron (ZOFRAN) IV  ? ?Antimicrobials: ?Anti-infectives (From admission, onward)  ? ? Start     Dose/Rate Route Frequency Ordered Stop  ? 12/02/21 2300  vancomycin (VANCOCIN) IVPB 1000 mg/200 mL premix  Status:  Discontinued       ? 1,000 mg ?200 mL/hr over 60 Minutes Intravenous Every 48 hours 12/01/21 1317 12/02/21 1458  ? 12/02/21 2300  vancomycin (VANCOREADY) IVPB 1750 mg/350 mL       ? 1,750 mg ?175 mL/hr over 120 Minutes Intravenous Every 48 hours 12/02/21 1458    ? 12/02/21 2200  ceFEPIme (MAXIPIME) 2 g in sodium chloride 0.9 % 100 mL IVPB       ? 2 g ?200 mL/hr over 30 Minutes Intravenous Every 12 hours 12/02/21 1500    ? 12/01/21 1400  ceFEPIme (MAXIPIME) 2 g in sodium chloride 0.9 % 100 mL IVPB  Status:  Discontinued       ? 2 g ?200 mL/hr over 30 Minutes Intravenous Daily 11/30/21 1540 12/02/21 1500  ? 11/30/21 2100  vancomycin (VANCOREADY) IVPB 750 mg/150 mL  Status:  Discontinued       ? 750 mg ?150 mL/hr over 60 Minutes Intravenous Every 48 hours 11/30/21 1956 12/01/21 1316  ? 11/30/21 1300  vancomycin (VANCOCIN) IVPB 1000 mg/200 mL premix       ? 1,000 mg ?200 mL/hr over 60 Minutes Intravenous  Once 11/30/21 1253 11/30/21 1455  ? 11/30/21 1300  ceFEPIme (MAXIPIME) 2 g in sodium chloride 0.9 % 100 mL IVPB       ? 2 g ?200 mL/hr over 30 Minutes Intravenous  Once 11/30/21 1253 11/30/21 1357  ? ?  ? ? ?Objective: ?Vitals:  ? 12/02/21 0527 12/02/21 0813  ?BP: (!) 151/67 (!) 159/79  ?Pulse: 70 74  ?Resp:  18  ?Temp: 98.2 ?F (36.8 ?C) 98 ?F (36.7 ?C)  ?SpO2: 98% 99%  ? ? ?Intake/Output Summary (Last 24 hours) at 12/02/2021 1618 ?Last data filed at 12/02/2021 0847 ?Gross per 24 hour  ?Intake 285.89 ml  ?Output 800 ml  ?Net -514.11 ml  ? ? ?Filed Weights  ? 11/30/21 2124 11/30/21 2307  ?Weight: 92.9 kg 92.9 kg  ? ?Weight change:  ?Body mass index is 35.16 kg/m?.   ? ?Physical Exam: ? ?Awake pleasant slow speech no icterus no pallor ?Moderate dentition ?Neck soft supple ?Chest clear ?Abdomen soft ?No lower extremity edema ?Reflexes not tested ?Sensory intact gross

## 2021-12-02 NOTE — TOC Initial Note (Addendum)
Transition of Care (TOC) - Initial/Assessment Note  ? ? ?Patient Details  ?Name: Kayla [name] ?MRN: [id number] ?Date of Birth: Feb 06, 1941 ? ?Transition of Care [facility]) CM/SW Contact:    ?Leeroy Cha, RN ?Phone Number: ?12/02/2021, 10:36 AM ? ?Clinical Narrative:                 ?Patient is from [facility].  Plan is to return there. ?Fl2 and request for return to [facility] sent. ?Expected Discharge Plan: [place] ?Barriers to Discharge: No Barriers Identified ? ? ?Patient Goals and CMS Choice ?Patient states their goals for this hospitalization and ongoing recovery are:: to go back to [facility] ?CMS Medicare.gov Compare Post Acute Care list provided to:: Patient ?Choice offered to / list presented to : Patient ? ?Expected Discharge Plan and Services ?Expected Discharge Plan: [place] ?  ?Discharge Planning Services: CM Consult ?  ?Living arrangements for the past 2 months: [place] ?                ?  ?  ?  ?  ?  ?  ?  ?  ?  ?  ? ?Prior Living Arrangements/Services ?Living arrangements for the past 2 months: [place] ?Lives with:: Facility Resident ?Patient language and need for interpreter reviewed:: Yes ?Do you feel safe going back to the place where you live?: Yes      ?  ?  ?  ?Criminal Activity/Legal Involvement Pertinent to Current Situation/Hospitalization: No - Comment as needed ? ?Activities of Daily Living ?Home Assistive Devices/Equipment: Other (Comment) (Pt stays at [facility] and unable to name what equipment she uses) ?ADL Screening (condition at time of admission) ?Patient's cognitive ability adequate to safely complete daily activities?: No ?Is the patient deaf or have difficulty hearing?: Yes ?Does the patient have difficulty seeing, even when wearing glasses/contacts?: No ?Does the patient have difficulty concentrating, remembering, or making decisions?: Yes ?Patient able to express need for assistance with ADLs?:  Yes ?Does the patient have difficulty dressing or bathing?: Yes ?Independently performs ADLs?: No ?Communication: Independent ?Dressing (OT): Needs assistance ?Is this a change from baseline?: Pre-admission baseline ?Grooming: Needs assistance ?Is this a change from baseline?: Pre-admission baseline ?Feeding: Needs assistance ?Is this a change from baseline?: Pre-admission baseline ?Bathing: Needs assistance ?Is this a change from baseline?: Pre-admission baseline ?Toileting: Needs assistance ?Is this a change from baseline?: Pre-admission baseline ?In/Out Bed: Needs assistance ?Is this a change from baseline?: Pre-admission baseline ?Walks in Home: Dependent ?Is this a change from baseline?: Pre-admission baseline ?Does the patient have difficulty walking or climbing stairs?: Yes ?Weakness of Legs: Both ?Weakness of Arms/Hands: Both ? ?Permission Sought/Granted ?  ?  ?   ?   ?   ?   ? ?Emotional Assessment ?Appearance:: Appears stated age ?  ?  ?Orientation: : Oriented to Self, Oriented to  Time, Oriented to Place, Oriented to Situation ?Alcohol / Substance Use: Not Applicable ?Psych Involvement: No (comment) ? ?Admission diagnosis:  Dehydration [E86.0] ?AKI (acute kidney injury) ([place]) [N17.9] ?Hypotension, unspecified hypotension type [I95.9] ?Patient Active Problem List  ? Diagnosis Date Noted  ? Pressure injury of skin 12/01/2021  ? AKI (acute kidney injury) ([place]) 11/30/2021  ? Protein-calorie malnutrition, severe ([place]) 11/30/2021  ? Sepsis due to undetermined organism ([place]) 11/30/2021  ? Acute cystitis without hematuria 08/26/2021  ? COVID-19 virus infection 08/26/2021  ? Facial droop 08/26/2021  ? Pressure ulcer of left heel 08/26/2021  ?  Acute metabolic encephalopathy 38/93/7342  ? Unspecified atrial fibrillation ([place]) 06/24/2021  ? Spinal stenosis of lumbar region at multiple levels 06/24/2021  ? Lumbar nerve root impingement 06/24/2021  ? Generalized weakness 06/23/2021  ? Ambulatory dysfunction 06/23/2021   ? Spinal stenosis 06/23/2021  ? Atrial flutter ([place]) 03/06/2021  ? Atrial flutter with rapid ventricular response ([place]) 03/05/2021  ? Type 2 diabetes mellitus ([place]) 03/05/2021  ? Elevated troponin   ? Anemia due to chronic blood loss 08/30/2020  ? Constipation 08/30/2020  ? Feces contents abnormal 08/30/2020  ? Hemorrhoids 08/30/2020  ? Proctalgia 08/30/2020  ? Rectal bleeding 08/30/2020  ? Lymphedema 12/23/2019  ? Arthritis 12/18/2018  ? Fatigue 07/09/2018  ? Gastroesophageal reflux disease without esophagitis 04/16/2018  ? Dependent edema 01/01/2017  ? Anemia of chronic illness 02/15/2015  ? Multiple falls 02/15/2015  ? Diabetic neuropathy, type II diabetes mellitus ([place]) 09/09/2012  ? Hyperlipidemia associated with type 2 diabetes mellitus ([place]) 05/22/2011  ? Primary hypertension 05/22/2011  ? Class 2 obesity due to excess calories with body mass index (BMI) of 37.0 to 37.9 in adult 12/14/2010  ? History of pulmonary embolus (PE) 12/14/2010  ? ?PCP:  Denita Lung, MD ?Pharmacy:  No Pharmacies Listed ? ? ? ?Social Determinants of Health (SDOH) Interventions ?  ? ?Readmission Risk Interventions ? ?  08/29/2021  ? 12:47 PM  ?Readmission Risk Prevention Plan  ?Transportation Screening Complete  ?PCP or Specialist Appt within 5-7 Days Complete  ?Home Care Screening Not Complete  ?Medication Review (RN CM) Referral to Pharmacy  ? ? ? ?

## 2021-12-03 DIAGNOSIS — N179 Acute kidney failure, unspecified: Secondary | ICD-10-CM | POA: Diagnosis not present

## 2021-12-03 LAB — CBC WITH DIFFERENTIAL/PLATELET
Abs Immature Granulocytes: 0.03 10*3/uL (ref 0.00–0.07)
Basophils Absolute: 0 10*3/uL (ref 0.0–0.1)
Basophils Relative: 1 %
Eosinophils Absolute: 0.1 10*3/uL (ref 0.0–0.5)
Eosinophils Relative: 2 %
HCT: 25.1 % — ABNORMAL LOW (ref 36.0–46.0)
Hemoglobin: 7.7 g/dL — ABNORMAL LOW (ref 12.0–15.0)
Immature Granulocytes: 1 %
Lymphocytes Relative: 21 %
Lymphs Abs: 1.4 10*3/uL (ref 0.7–4.0)
MCH: 25.2 pg — ABNORMAL LOW (ref 26.0–34.0)
MCHC: 30.7 g/dL (ref 30.0–36.0)
MCV: 82 fL (ref 80.0–100.0)
Monocytes Absolute: 0.7 10*3/uL (ref 0.1–1.0)
Monocytes Relative: 11 %
Neutro Abs: 4.2 10*3/uL (ref 1.7–7.7)
Neutrophils Relative %: 64 %
Platelets: 275 10*3/uL (ref 150–400)
RBC: 3.06 MIL/uL — ABNORMAL LOW (ref 3.87–5.11)
RDW: 15.5 % (ref 11.5–15.5)
WBC: 6.5 10*3/uL (ref 4.0–10.5)
nRBC: 0 % (ref 0.0–0.2)

## 2021-12-03 LAB — COMPREHENSIVE METABOLIC PANEL
ALT: 20 U/L (ref 0–44)
AST: 24 U/L (ref 15–41)
Albumin: 2 g/dL — ABNORMAL LOW (ref 3.5–5.0)
Alkaline Phosphatase: 64 U/L (ref 38–126)
Anion gap: 10 (ref 5–15)
BUN: 28 mg/dL — ABNORMAL HIGH (ref 8–23)
CO2: 24 mmol/L (ref 22–32)
Calcium: 7.7 mg/dL — ABNORMAL LOW (ref 8.9–10.3)
Chloride: 105 mmol/L (ref 98–111)
Creatinine, Ser: 0.73 mg/dL (ref 0.44–1.00)
GFR, Estimated: >60 mL/min (ref >60)
Glucose, Bld: 123 mg/dL — ABNORMAL HIGH (ref 70–99)
Potassium: 3.4 mmol/L — ABNORMAL LOW (ref 3.5–5.1)
Sodium: 139 mmol/L (ref 135–145)
Total Bilirubin: 0.5 mg/dL (ref 0.3–1.2)
Total Protein: 6.2 g/dL — ABNORMAL LOW (ref 6.5–8.1)

## 2021-12-03 LAB — GLUCOSE, CAPILLARY
Glucose-Capillary: 146 mg/dL — ABNORMAL HIGH (ref 70–99)
Glucose-Capillary: 123 mg/dL — ABNORMAL HIGH (ref 70–99)

## 2021-12-03 MED ORDER — GABAPENTIN 100 MG PO CAPS: 100.0000 mg | ORAL_CAPSULE | Freq: Every day | ORAL | 0 refills | Status: AC

## 2021-12-03 MED ORDER — CEFDINIR 300 MG PO CAPS
300.0000 mg | ORAL_CAPSULE | Freq: Two times a day (BID) | ORAL | 0 refills | Status: DC
Start: 2021-12-03 — End: 2023-03-21

## 2021-12-03 MED ORDER — CEFDINIR 300 MG PO CAPS
300.0000 mg | ORAL_CAPSULE | Freq: Two times a day (BID) | ORAL | Status: DC
  Administered 2021-12-03: 300 mg via ORAL
  Filled 2021-12-03 (×2): qty 1

## 2021-12-03 NOTE — Discharge Summary (Addendum)
Physician Discharge Summary  ?SONIKA LEVINS ERX:[id number] DOB: 09-Mar-1941 DOA: 11/30/2021 ? ?PCP: Denita Lung, MD ? ?Admit date: 11/30/2021 ?Discharge date: 12/03/2021 ? ?Time spent: 27 minutes ? ?Recommendations for Outpatient Follow-up:  ?Complete course of Omnicef for E. coli UTI--- I would recommend referral to urologist in the outpatient setting to determine if suppressive therapy with Keflex 250 daily subsequent to treatment of this is a reasonable option--- she may start Keflex 250 daily aspirin skilled nursing physicians discretion on 12/06/2021 ?Repeat CBC Chem-12 12/07/2021 ?Recommend [place], MMSE in the outpatient setting given some mental status changes ?Please look carefully multiple medication classes-she has been discontinued off of several antihypertensives and  ?we stopped her metformin-would suggest trial of sulfonylurea low-dose if she eats about 50% of her meals-would not stringently control her blood sugar as very high risk for hypoglycemia ? ?Discharge Diagnoses:  ?MAIN problem for hospitalization  ? ?E. coli UTI lower urinary tract not pyelonephritis ? ?Please see below for itemized issues addressed in HOpsital- ?refer to other progress notes for clarity if needed ? ?Discharge Condition: Improved ? ?Diet recommendation: Regular ? ?Filed Weights  ? 11/30/21 2124 11/30/21 2307  ?Weight: 92.9 kg 92.9 kg  ? ? ?History of present illness:  ?18 [facility] resident DM2, HTN, HLD, obesity, PE, chronic A-fib,, osteoarthritis lumbar nerve root impingement, facial droop, chronic bilateral foot wounds. ?Patient was sent to the ED from [facility] on 5/3 with complaint of fever of 102, altered mental status and impaired mobility.  Per report, she was recently started on a daily ceftriaxone IM regimen at the facility for presumptive UTI.   ?  ?In the ED, patient was afebrile, heart rate in 90s, blood pressure low in 80s ?Blood culture and urine culture sent. ?Patient was started on IV broad-spectrum antibiotics  and IV fluid. ?Labs showed a CBC with WBC of 11.3, hemoglobin 8, lactic acid level normal.  BUN/creatinine 56/2.36, baseline creatinine less than 1. ?Chest x-ray unremarkable ?CT head did not show acute intracranial abnormality. ? ?Hospital Course:  ?Sepsis secondary to lower urinary infection ?I called the lab and the urine culture was going over 100,000 colony-forming units of E. coli blood cultures were negative from admission 5/3 ?We transitioned antibiotics to Omnicef 300 twice daily ?I had a long discussion with the patient's son on telephone 5/5 and we discussed risk benefits and alternatives to suppressive therapy with Keflex-she has had more than 3 urinary tract infections in the past 3 to 6 months-he understands the risk of C. difficile and MDR pathogens on use of suppressive therapy and I would recommend Keflex 250 daily to prevent frequent hospitalizations ?AKI on admission ?Thought secondary to urinary infection in addition to PTA HCTZ ?We discontinued HCTZ-see below ?HTN ?Moderate control only-losartan HCTZ all held ?Continue Coreg 6.25 twice daily that was started this admission for pressure control ?Lasix is also been held ?Chronic A-fib CHADVASC >3 ?Prior history of pulmonary embolism? ?Continue Coreg as above Eliquis 5 twice daily ?DM TY 2 A1c 6.4 07/2021 ?Metformin to be discontinued-avoid stringent control given high risk of hypoglycemia with poor p.o. ?Gabapentin dosing was adjusted to prevent polypharmacy and risk of encephalopathy-see MAR for changes ? ?Discharge Exam: ?Vitals:  ? 12/02/21 2126 12/03/21 9509  ?BP: (!) 162/73 (!) 153/101  ?Pulse: 77 91  ?Resp: 16 16  ?Temp: 98 ?F (36.7 ?C) 97.9 ?F (36.6 ?C)  ?SpO2: 99% 96%  ? ? ?Subj on day of d/c ?  ?Awake pleasant coherent no distress does have  some echolalia ?She is much more coherent than she was yesterday however she is able to tell me she has a son and sherrill at [address] in [place]-she seems to lose track of where her third son is ?She has no  other specific complaints ? ? ?General Exam on discharge ? ?EOMI NCAT no focal deficit ?CTA B no added sound rales rhonchi ?S1-S2 no murmur no rub no gallop ?Abdomen is obese nontender no rebound no guarding ?ROM is intact ?Power is 5/5 ?Psych is euthymic appropriate ? ?Discharge Instructions ? ? ?Discharge Instructions   ? ? Diet - low sodium heart healthy   Complete by: As directed ?  ? Discharge wound care:   Complete by: As directed ?  ? As per above  ? Increase activity slowly   Complete by: As directed ?  ? ?  ? ?Allergies as of 12/03/2021   ?No Known Allergies ?  ? ?  ?Medication List  ?  ? ?STOP taking these medications   ? ?cefTRIAXone 1 g injection ?Commonly known as: ROCEPHIN ?  ?diclofenac Sodium 1 % Gel ?Commonly known as: VOLTAREN ?  ?fluticasone 50 MCG/ACT nasal spray ?Commonly known as: FLONASE ?  ?hydrochlorothiazide 12.5 MG tablet ?Commonly known as: HYDRODIURIL ?  ?loratadine 10 MG tablet ?Commonly known as: CLARITIN ?  ?metFORMIN 500 MG tablet ?Commonly known as: GLUCOPHAGE ?  ? ?  ? ?TAKE these medications   ? ?Accu-Chek Softclix Lancets lancets ?1 each by Other route daily. Use as instructed ?  ?acetaminophen 325 MG tablet ?Commonly known as: TYLENOL ?Take 650 mg by mouth every 8 (eight) hours as needed (pain). ?  ?apixaban 5 MG Tabs tablet ?Commonly known as: ELIQUIS ?Take 5 mg by mouth 2 (two) times daily. ?  ?ascorbic acid 500 MG tablet ?Commonly known as: VITAMIN C ?Take 1 tablet (500 mg total) by mouth daily. ?What changed: when to take this ?  ?atorvastatin 80 MG tablet ?Commonly known as: LIPITOR ?Take 1 tablet (80 mg total) by mouth daily. ?What changed: when to take this ?  ?Biofreeze 4 % Gel ?Generic drug: Menthol (Topical Analgesic) ?Apply 1 application. topically See admin instructions. Apply thin layer topically to right shoulder and arm three times daily for pain, may apply to other affected areas also ?  ?carvedilol 6.25 MG tablet ?Commonly known as: COREG ?Take 1 tablet (6.25 mg  total) by mouth 2 (two) times daily. ?  ?cefdinir 300 MG capsule ?Commonly known as: OMNICEF ?Take 1 capsule (300 mg total) by mouth every 12 (twelve) hours. ?  ?docusate sodium 100 MG capsule ?Commonly known as: COLACE ?Take 100 mg by mouth every morning. Take with apply juice ?  ?ezetimibe 10 MG tablet ?Commonly known as: ZETIA ?Take 10 mg by mouth at bedtime. ?  ?ferrous sulfate 325 (65 FE) MG tablet ?Take 325 mg by mouth 2 (two) times daily. ?  ?gabapentin 100 MG capsule ?Commonly known as: NEURONTIN ?Take 1 capsule (100 mg total) by mouth daily. ?What changed:  ?medication strength ?how much to take ?when to take this ?  ?glucose blood test strip ?1 each by Other route as needed for other. Use as instructed ?What changed: when to take this ?  ?ipratropium-albuterol 0.5-2.5 (3) MG/3ML Soln ?Commonly known as: DUONEB ?Take 3 mLs by nebulization every 4 (four) hours as needed. ?What changed: reasons to take this ?  ?lidocaine 4 % ?Place 2 patches onto the skin See admin instructions. Apply 2 patches transdermally to left leg every  morning, remove nightly ?  ?lidocaine 5 % ?Commonly known as: LIDODERM ?Place 1 patch onto the skin See admin instructions. Apply 1 patch transdermally to right shoulder and 1 patch to right elbow - every afternoon. Remove & Discard patch within 12 hours or as directed by MD ?  ?losartan 25 MG tablet ?Commonly known as: COZAAR ?Take 25 mg by mouth every morning. ?  ?melatonin 5 MG Tabs ?Take 5 mg by mouth at bedtime as needed (sleep). ?  ?nitroGLYCERIN 0.4 MG SL tablet ?Commonly known as: NITROSTAT ?Take up to 3 tablets ?What changed:  ?how much to take ?when to take this ?reasons to take this ?additional instructions ?  ?Pro-Stat AWC Liqd ?Take 30 mLs by mouth 2 (two) times daily. ?  ? ?  ? ?  ?  ? ? ?  ?Discharge Care Instructions  ?(From admission, onward)  ?  ? ? ?  ? ?  Start     Ordered  ? 12/03/21 0000  Discharge wound care:       ?Comments: As per above  ? 12/03/21 1023  ? ?  ?   ? ?  ? ?No Known Allergies ? ? ? ?The results of significant diagnostics from this hospitalization (including imaging, microbiology, ancillary and laboratory) are listed below for reference.   ? ?Signi

## 2021-12-03 NOTE — TOC Progression Note (Addendum)
Transition of Care (TOC) - Progression Note  ? ? ?Patient Details  ?Name: Kayla [name] ?MRN: [id number] ?Date of Birth: 06-30-1941 ? ?Transition of Care (TOC) CM/SW Contact  ?Tawanna Cooler, RN ?Phone Number: ?12/03/2021, 9:57 AM ? ?Clinical Narrative:    ? ?Reached out to admissions at [facility] to see if patient can return today.  Per [facility] in admissions, they can accept patient back if she's ready.  Patient is LTC there.  ? ?1216 Addendum: Patient going to room 406B.  Number to call report is [phone].  ?CM called patient's son, Lawonda Pretlow, to let him know of d/c back to [place].   ?PTAR called at 11:14 am.   ? ? ?Expected Discharge Plan: [place] ?Barriers to Discharge: No Barriers Identified ? ?Expected Discharge Plan and Services ?Expected Discharge Plan: [place] ?  ?Discharge Planning Services: CM Consult ?  ?Living arrangements for the past 2 months: [place] ?                ?  ?  ?  ?  ?  ?  ?

## 2021-12-03 NOTE — Progress Notes (Signed)
PTAR picked up pt at 1657 for transport to [facility].  Pt not in any distress upon leaving, all valuables present and accounted for.  Report called to facility at 1330.  Pt denies any questions or concerns, verbalized understanding for DC plan, antibiotic compliance.  Tele box returned, IV removed. ?

## 2021-12-03 NOTE — Progress Notes (Signed)
Report called to [facility] s/w assigned nurse @ 1300.  Pt has daughter at bedside, awaiting stretcher transport.  Pt with intermittent confusion.  Asking for belongings; daughter and son via phone confirmed that she has all belongings that were brought to the hospital (eyeglasses). ?

## 2021-12-03 NOTE — Plan of Care (Signed)
?  Problem: Clinical Measurements: ?Goal: Ability to maintain clinical measurements within normal limits will improve ?Outcome: Adequate for Discharge ?Goal: Will remain free from infection ?Outcome: Adequate for Discharge ?  ?Problem: Nutrition: ?Goal: Adequate nutrition will be maintained ?Outcome: Adequate for Discharge ?  ?

## 2021-12-04 DIAGNOSIS — M6281 Muscle weakness (generalized): Secondary | ICD-10-CM | POA: Diagnosis not present

## 2021-12-04 DIAGNOSIS — R2681 Unsteadiness on feet: Secondary | ICD-10-CM | POA: Diagnosis not present

## 2021-12-04 DIAGNOSIS — A419 Sepsis, unspecified organism: Secondary | ICD-10-CM | POA: Diagnosis not present

## 2021-12-04 DIAGNOSIS — R41841 Cognitive communication deficit: Secondary | ICD-10-CM | POA: Diagnosis not present

## 2021-12-04 DIAGNOSIS — R262 Difficulty in walking, not elsewhere classified: Secondary | ICD-10-CM | POA: Diagnosis not present

## 2021-12-04 DIAGNOSIS — R2689 Other abnormalities of gait and mobility: Secondary | ICD-10-CM | POA: Diagnosis not present

## 2021-12-04 DIAGNOSIS — G9341 Metabolic encephalopathy: Secondary | ICD-10-CM | POA: Diagnosis not present

## 2021-12-04 LAB — URINE CULTURE: Culture: >=100000 — AB

## 2021-12-05 DIAGNOSIS — U071 COVID-19: Secondary | ICD-10-CM | POA: Diagnosis not present

## 2021-12-05 DIAGNOSIS — E11621 Type 2 diabetes mellitus with foot ulcer: Secondary | ICD-10-CM | POA: Diagnosis not present

## 2021-12-05 DIAGNOSIS — E118 Type 2 diabetes mellitus with unspecified complications: Secondary | ICD-10-CM | POA: Diagnosis not present

## 2021-12-05 DIAGNOSIS — E6609 Other obesity due to excess calories: Secondary | ICD-10-CM | POA: Diagnosis not present

## 2021-12-05 DIAGNOSIS — R2689 Other abnormalities of gait and mobility: Secondary | ICD-10-CM | POA: Diagnosis not present

## 2021-12-05 DIAGNOSIS — M6281 Muscle weakness (generalized): Secondary | ICD-10-CM | POA: Diagnosis not present

## 2021-12-05 DIAGNOSIS — R262 Difficulty in walking, not elsewhere classified: Secondary | ICD-10-CM | POA: Diagnosis not present

## 2021-12-05 DIAGNOSIS — K648 Other hemorrhoids: Secondary | ICD-10-CM | POA: Diagnosis not present

## 2021-12-05 DIAGNOSIS — G9341 Metabolic encephalopathy: Secondary | ICD-10-CM | POA: Diagnosis not present

## 2021-12-05 DIAGNOSIS — R41841 Cognitive communication deficit: Secondary | ICD-10-CM | POA: Diagnosis not present

## 2021-12-05 DIAGNOSIS — G8929 Other chronic pain: Secondary | ICD-10-CM | POA: Diagnosis not present

## 2021-12-05 DIAGNOSIS — R2681 Unsteadiness on feet: Secondary | ICD-10-CM | POA: Diagnosis not present

## 2021-12-05 DIAGNOSIS — A419 Sepsis, unspecified organism: Secondary | ICD-10-CM | POA: Diagnosis not present

## 2021-12-05 LAB — CULTURE, BLOOD (ROUTINE X 2)
Culture: NO GROWTH
Culture: NO GROWTH

## 2021-12-06 DIAGNOSIS — R262 Difficulty in walking, not elsewhere classified: Secondary | ICD-10-CM | POA: Diagnosis not present

## 2021-12-06 DIAGNOSIS — R2681 Unsteadiness on feet: Secondary | ICD-10-CM | POA: Diagnosis not present

## 2021-12-06 DIAGNOSIS — R2689 Other abnormalities of gait and mobility: Secondary | ICD-10-CM | POA: Diagnosis not present

## 2021-12-06 DIAGNOSIS — I4892 Unspecified atrial flutter: Secondary | ICD-10-CM | POA: Diagnosis not present

## 2021-12-06 DIAGNOSIS — R41841 Cognitive communication deficit: Secondary | ICD-10-CM | POA: Diagnosis not present

## 2021-12-06 DIAGNOSIS — R4701 Aphasia: Secondary | ICD-10-CM | POA: Diagnosis not present

## 2021-12-06 DIAGNOSIS — A419 Sepsis, unspecified organism: Secondary | ICD-10-CM | POA: Diagnosis not present

## 2021-12-06 DIAGNOSIS — N390 Urinary tract infection, site not specified: Secondary | ICD-10-CM | POA: Diagnosis not present

## 2021-12-06 DIAGNOSIS — I4891 Unspecified atrial fibrillation: Secondary | ICD-10-CM | POA: Diagnosis not present

## 2021-12-06 DIAGNOSIS — G9341 Metabolic encephalopathy: Secondary | ICD-10-CM | POA: Diagnosis not present

## 2021-12-06 DIAGNOSIS — B9620 Unspecified Escherichia coli [E. coli] as the cause of diseases classified elsewhere: Secondary | ICD-10-CM | POA: Diagnosis not present

## 2021-12-06 DIAGNOSIS — Q670 Congenital facial asymmetry: Secondary | ICD-10-CM | POA: Diagnosis not present

## 2021-12-06 DIAGNOSIS — R5381 Other malaise: Secondary | ICD-10-CM | POA: Diagnosis not present

## 2021-12-06 DIAGNOSIS — L89150 Pressure ulcer of sacral region, unstageable: Secondary | ICD-10-CM | POA: Diagnosis not present

## 2021-12-06 DIAGNOSIS — L97422 Non-pressure chronic ulcer of left heel and midfoot with fat layer exposed: Secondary | ICD-10-CM | POA: Diagnosis not present

## 2021-12-06 DIAGNOSIS — M48061 Spinal stenosis, lumbar region without neurogenic claudication: Secondary | ICD-10-CM | POA: Diagnosis not present

## 2021-12-06 DIAGNOSIS — M6281 Muscle weakness (generalized): Secondary | ICD-10-CM | POA: Diagnosis not present

## 2021-12-07 ENCOUNTER — Other Ambulatory Visit: Payer: Self-pay | Admitting: Family Medicine

## 2021-12-07 ENCOUNTER — Other Ambulatory Visit (HOSPITAL_COMMUNITY): Payer: Self-pay | Admitting: Family Medicine

## 2021-12-07 ENCOUNTER — Other Ambulatory Visit: Payer: Self-pay | Admitting: Internal Medicine

## 2021-12-07 DIAGNOSIS — E118 Type 2 diabetes mellitus with unspecified complications: Secondary | ICD-10-CM | POA: Diagnosis not present

## 2021-12-07 DIAGNOSIS — R2681 Unsteadiness on feet: Secondary | ICD-10-CM | POA: Diagnosis not present

## 2021-12-07 DIAGNOSIS — R4789 Other speech disturbances: Secondary | ICD-10-CM

## 2021-12-07 DIAGNOSIS — E6609 Other obesity due to excess calories: Secondary | ICD-10-CM | POA: Diagnosis not present

## 2021-12-07 DIAGNOSIS — E11621 Type 2 diabetes mellitus with foot ulcer: Secondary | ICD-10-CM | POA: Diagnosis not present

## 2021-12-07 DIAGNOSIS — I10 Essential (primary) hypertension: Secondary | ICD-10-CM | POA: Diagnosis not present

## 2021-12-07 DIAGNOSIS — R41841 Cognitive communication deficit: Secondary | ICD-10-CM | POA: Diagnosis not present

## 2021-12-07 DIAGNOSIS — R262 Difficulty in walking, not elsewhere classified: Secondary | ICD-10-CM | POA: Diagnosis not present

## 2021-12-07 DIAGNOSIS — M6281 Muscle weakness (generalized): Secondary | ICD-10-CM | POA: Diagnosis not present

## 2021-12-07 DIAGNOSIS — A419 Sepsis, unspecified organism: Secondary | ICD-10-CM | POA: Diagnosis not present

## 2021-12-07 DIAGNOSIS — G8929 Other chronic pain: Secondary | ICD-10-CM | POA: Diagnosis not present

## 2021-12-07 DIAGNOSIS — R2689 Other abnormalities of gait and mobility: Secondary | ICD-10-CM | POA: Diagnosis not present

## 2021-12-07 DIAGNOSIS — R4182 Altered mental status, unspecified: Secondary | ICD-10-CM

## 2021-12-07 DIAGNOSIS — K648 Other hemorrhoids: Secondary | ICD-10-CM | POA: Diagnosis not present

## 2021-12-07 DIAGNOSIS — G9341 Metabolic encephalopathy: Secondary | ICD-10-CM | POA: Diagnosis not present

## 2021-12-07 DIAGNOSIS — U071 COVID-19: Secondary | ICD-10-CM | POA: Diagnosis not present

## 2021-12-08 DIAGNOSIS — R29818 Other symptoms and signs involving the nervous system: Secondary | ICD-10-CM | POA: Diagnosis not present

## 2021-12-08 DIAGNOSIS — R262 Difficulty in walking, not elsewhere classified: Secondary | ICD-10-CM | POA: Diagnosis not present

## 2021-12-08 DIAGNOSIS — R41841 Cognitive communication deficit: Secondary | ICD-10-CM | POA: Diagnosis not present

## 2021-12-08 DIAGNOSIS — A419 Sepsis, unspecified organism: Secondary | ICD-10-CM | POA: Diagnosis not present

## 2021-12-08 DIAGNOSIS — M6281 Muscle weakness (generalized): Secondary | ICD-10-CM | POA: Diagnosis not present

## 2021-12-08 DIAGNOSIS — D509 Iron deficiency anemia, unspecified: Secondary | ICD-10-CM | POA: Diagnosis not present

## 2021-12-08 DIAGNOSIS — G9341 Metabolic encephalopathy: Secondary | ICD-10-CM | POA: Diagnosis not present

## 2021-12-08 DIAGNOSIS — R2689 Other abnormalities of gait and mobility: Secondary | ICD-10-CM | POA: Diagnosis not present

## 2021-12-08 DIAGNOSIS — R2681 Unsteadiness on feet: Secondary | ICD-10-CM | POA: Diagnosis not present

## 2021-12-09 ENCOUNTER — Ambulatory Visit (HOSPITAL_COMMUNITY): Payer: Medicare PPO

## 2021-12-09 DIAGNOSIS — R262 Difficulty in walking, not elsewhere classified: Secondary | ICD-10-CM | POA: Diagnosis not present

## 2021-12-09 DIAGNOSIS — M6281 Muscle weakness (generalized): Secondary | ICD-10-CM | POA: Diagnosis not present

## 2021-12-09 DIAGNOSIS — R4182 Altered mental status, unspecified: Secondary | ICD-10-CM | POA: Diagnosis not present

## 2021-12-09 DIAGNOSIS — N390 Urinary tract infection, site not specified: Secondary | ICD-10-CM | POA: Diagnosis not present

## 2021-12-09 DIAGNOSIS — R2681 Unsteadiness on feet: Secondary | ICD-10-CM | POA: Diagnosis not present

## 2021-12-09 DIAGNOSIS — E11621 Type 2 diabetes mellitus with foot ulcer: Secondary | ICD-10-CM | POA: Diagnosis not present

## 2021-12-09 DIAGNOSIS — N3000 Acute cystitis without hematuria: Secondary | ICD-10-CM | POA: Diagnosis not present

## 2021-12-09 DIAGNOSIS — A419 Sepsis, unspecified organism: Secondary | ICD-10-CM | POA: Diagnosis not present

## 2021-12-09 DIAGNOSIS — R2689 Other abnormalities of gait and mobility: Secondary | ICD-10-CM | POA: Diagnosis not present

## 2021-12-09 DIAGNOSIS — R41841 Cognitive communication deficit: Secondary | ICD-10-CM | POA: Diagnosis not present

## 2021-12-09 DIAGNOSIS — G9341 Metabolic encephalopathy: Secondary | ICD-10-CM | POA: Diagnosis not present

## 2021-12-09 DIAGNOSIS — R0902 Hypoxemia: Secondary | ICD-10-CM | POA: Diagnosis not present

## 2021-12-09 DIAGNOSIS — I10 Essential (primary) hypertension: Secondary | ICD-10-CM | POA: Diagnosis not present

## 2021-12-10 DIAGNOSIS — A419 Sepsis, unspecified organism: Secondary | ICD-10-CM | POA: Diagnosis not present

## 2021-12-10 DIAGNOSIS — R2689 Other abnormalities of gait and mobility: Secondary | ICD-10-CM | POA: Diagnosis not present

## 2021-12-10 DIAGNOSIS — R262 Difficulty in walking, not elsewhere classified: Secondary | ICD-10-CM | POA: Diagnosis not present

## 2021-12-10 DIAGNOSIS — E119 Type 2 diabetes mellitus without complications: Secondary | ICD-10-CM | POA: Diagnosis not present

## 2021-12-10 DIAGNOSIS — R2681 Unsteadiness on feet: Secondary | ICD-10-CM | POA: Diagnosis not present

## 2021-12-10 DIAGNOSIS — R41841 Cognitive communication deficit: Secondary | ICD-10-CM | POA: Diagnosis not present

## 2021-12-10 DIAGNOSIS — M6281 Muscle weakness (generalized): Secondary | ICD-10-CM | POA: Diagnosis not present

## 2021-12-10 DIAGNOSIS — G9341 Metabolic encephalopathy: Secondary | ICD-10-CM | POA: Diagnosis not present

## 2021-12-12 DIAGNOSIS — R41841 Cognitive communication deficit: Secondary | ICD-10-CM | POA: Diagnosis not present

## 2021-12-12 DIAGNOSIS — G9341 Metabolic encephalopathy: Secondary | ICD-10-CM | POA: Diagnosis not present

## 2021-12-12 DIAGNOSIS — F4321 Adjustment disorder with depressed mood: Secondary | ICD-10-CM | POA: Diagnosis not present

## 2021-12-12 DIAGNOSIS — E11621 Type 2 diabetes mellitus with foot ulcer: Secondary | ICD-10-CM | POA: Diagnosis not present

## 2021-12-12 DIAGNOSIS — E118 Type 2 diabetes mellitus with unspecified complications: Secondary | ICD-10-CM | POA: Diagnosis not present

## 2021-12-12 DIAGNOSIS — R2681 Unsteadiness on feet: Secondary | ICD-10-CM | POA: Diagnosis not present

## 2021-12-12 DIAGNOSIS — A419 Sepsis, unspecified organism: Secondary | ICD-10-CM | POA: Diagnosis not present

## 2021-12-12 DIAGNOSIS — K648 Other hemorrhoids: Secondary | ICD-10-CM | POA: Diagnosis not present

## 2021-12-12 DIAGNOSIS — M6281 Muscle weakness (generalized): Secondary | ICD-10-CM | POA: Diagnosis not present

## 2021-12-12 DIAGNOSIS — R2689 Other abnormalities of gait and mobility: Secondary | ICD-10-CM | POA: Diagnosis not present

## 2021-12-12 DIAGNOSIS — E6609 Other obesity due to excess calories: Secondary | ICD-10-CM | POA: Diagnosis not present

## 2021-12-12 DIAGNOSIS — G8929 Other chronic pain: Secondary | ICD-10-CM | POA: Diagnosis not present

## 2021-12-12 DIAGNOSIS — R262 Difficulty in walking, not elsewhere classified: Secondary | ICD-10-CM | POA: Diagnosis not present

## 2021-12-12 DIAGNOSIS — U071 COVID-19: Secondary | ICD-10-CM | POA: Diagnosis not present

## 2021-12-13 ENCOUNTER — Ambulatory Visit (HOSPITAL_COMMUNITY)
Admission: RE | Admit: 2021-12-13 | Discharge: 2021-12-13 | Disposition: A | Discharge status: Home / Self Care | Payer: Medicare PPO | Source: Ambulatory Visit | Attending: Family Medicine | Admitting: Family Medicine

## 2021-12-13 DIAGNOSIS — A419 Sepsis, unspecified organism: Secondary | ICD-10-CM | POA: Diagnosis not present

## 2021-12-13 DIAGNOSIS — R41841 Cognitive communication deficit: Secondary | ICD-10-CM | POA: Diagnosis not present

## 2021-12-13 DIAGNOSIS — R2689 Other abnormalities of gait and mobility: Secondary | ICD-10-CM | POA: Diagnosis not present

## 2021-12-13 DIAGNOSIS — R4182 Altered mental status, unspecified: Secondary | ICD-10-CM | POA: Insufficient documentation

## 2021-12-13 DIAGNOSIS — G9341 Metabolic encephalopathy: Secondary | ICD-10-CM | POA: Diagnosis not present

## 2021-12-13 DIAGNOSIS — R4789 Other speech disturbances: Secondary | ICD-10-CM | POA: Insufficient documentation

## 2021-12-13 DIAGNOSIS — M6281 Muscle weakness (generalized): Secondary | ICD-10-CM | POA: Diagnosis not present

## 2021-12-13 DIAGNOSIS — R4781 Slurred speech: Secondary | ICD-10-CM | POA: Diagnosis not present

## 2021-12-13 DIAGNOSIS — R262 Difficulty in walking, not elsewhere classified: Secondary | ICD-10-CM | POA: Diagnosis not present

## 2021-12-13 DIAGNOSIS — R2681 Unsteadiness on feet: Secondary | ICD-10-CM | POA: Diagnosis not present

## 2021-12-14 DIAGNOSIS — I69919 Unspecified symptoms and signs involving cognitive functions following unspecified cerebrovascular disease: Secondary | ICD-10-CM | POA: Diagnosis not present

## 2021-12-14 DIAGNOSIS — R262 Difficulty in walking, not elsewhere classified: Secondary | ICD-10-CM | POA: Diagnosis not present

## 2021-12-14 DIAGNOSIS — R2689 Other abnormalities of gait and mobility: Secondary | ICD-10-CM | POA: Diagnosis not present

## 2021-12-14 DIAGNOSIS — R41841 Cognitive communication deficit: Secondary | ICD-10-CM | POA: Diagnosis not present

## 2021-12-14 DIAGNOSIS — G9341 Metabolic encephalopathy: Secondary | ICD-10-CM | POA: Diagnosis not present

## 2021-12-14 DIAGNOSIS — R2681 Unsteadiness on feet: Secondary | ICD-10-CM | POA: Diagnosis not present

## 2021-12-14 DIAGNOSIS — I679 Cerebrovascular disease, unspecified: Secondary | ICD-10-CM | POA: Diagnosis not present

## 2021-12-14 DIAGNOSIS — M6281 Muscle weakness (generalized): Secondary | ICD-10-CM | POA: Diagnosis not present

## 2021-12-14 DIAGNOSIS — N390 Urinary tract infection, site not specified: Secondary | ICD-10-CM | POA: Diagnosis not present

## 2021-12-14 DIAGNOSIS — R4182 Altered mental status, unspecified: Secondary | ICD-10-CM | POA: Diagnosis not present

## 2021-12-14 DIAGNOSIS — A419 Sepsis, unspecified organism: Secondary | ICD-10-CM | POA: Diagnosis not present

## 2021-12-15 DIAGNOSIS — R41841 Cognitive communication deficit: Secondary | ICD-10-CM | POA: Diagnosis not present

## 2021-12-15 DIAGNOSIS — R262 Difficulty in walking, not elsewhere classified: Secondary | ICD-10-CM | POA: Diagnosis not present

## 2021-12-15 DIAGNOSIS — G9341 Metabolic encephalopathy: Secondary | ICD-10-CM | POA: Diagnosis not present

## 2021-12-15 DIAGNOSIS — R2681 Unsteadiness on feet: Secondary | ICD-10-CM | POA: Diagnosis not present

## 2021-12-15 DIAGNOSIS — R2689 Other abnormalities of gait and mobility: Secondary | ICD-10-CM | POA: Diagnosis not present

## 2021-12-15 DIAGNOSIS — A419 Sepsis, unspecified organism: Secondary | ICD-10-CM | POA: Diagnosis not present

## 2021-12-15 DIAGNOSIS — M6281 Muscle weakness (generalized): Secondary | ICD-10-CM | POA: Diagnosis not present

## 2021-12-17 DIAGNOSIS — M6281 Muscle weakness (generalized): Secondary | ICD-10-CM | POA: Diagnosis not present

## 2021-12-17 DIAGNOSIS — R41841 Cognitive communication deficit: Secondary | ICD-10-CM | POA: Diagnosis not present

## 2021-12-17 DIAGNOSIS — R262 Difficulty in walking, not elsewhere classified: Secondary | ICD-10-CM | POA: Diagnosis not present

## 2021-12-17 DIAGNOSIS — G9341 Metabolic encephalopathy: Secondary | ICD-10-CM | POA: Diagnosis not present

## 2021-12-17 DIAGNOSIS — A419 Sepsis, unspecified organism: Secondary | ICD-10-CM | POA: Diagnosis not present

## 2021-12-17 DIAGNOSIS — R2681 Unsteadiness on feet: Secondary | ICD-10-CM | POA: Diagnosis not present

## 2021-12-17 DIAGNOSIS — R2689 Other abnormalities of gait and mobility: Secondary | ICD-10-CM | POA: Diagnosis not present

## 2021-12-18 DIAGNOSIS — R2681 Unsteadiness on feet: Secondary | ICD-10-CM | POA: Diagnosis not present

## 2021-12-18 DIAGNOSIS — R2689 Other abnormalities of gait and mobility: Secondary | ICD-10-CM | POA: Diagnosis not present

## 2021-12-18 DIAGNOSIS — R262 Difficulty in walking, not elsewhere classified: Secondary | ICD-10-CM | POA: Diagnosis not present

## 2021-12-18 DIAGNOSIS — A419 Sepsis, unspecified organism: Secondary | ICD-10-CM | POA: Diagnosis not present

## 2021-12-18 DIAGNOSIS — M6281 Muscle weakness (generalized): Secondary | ICD-10-CM | POA: Diagnosis not present

## 2021-12-18 DIAGNOSIS — G9341 Metabolic encephalopathy: Secondary | ICD-10-CM | POA: Diagnosis not present

## 2021-12-18 DIAGNOSIS — R41841 Cognitive communication deficit: Secondary | ICD-10-CM | POA: Diagnosis not present

## 2021-12-19 DIAGNOSIS — Z7901 Long term (current) use of anticoagulants: Secondary | ICD-10-CM | POA: Diagnosis not present

## 2021-12-19 DIAGNOSIS — M6281 Muscle weakness (generalized): Secondary | ICD-10-CM | POA: Diagnosis not present

## 2021-12-19 DIAGNOSIS — R519 Headache, unspecified: Secondary | ICD-10-CM | POA: Diagnosis not present

## 2021-12-19 DIAGNOSIS — R2681 Unsteadiness on feet: Secondary | ICD-10-CM | POA: Diagnosis not present

## 2021-12-19 DIAGNOSIS — R262 Difficulty in walking, not elsewhere classified: Secondary | ICD-10-CM | POA: Diagnosis not present

## 2021-12-19 DIAGNOSIS — G9341 Metabolic encephalopathy: Secondary | ICD-10-CM | POA: Diagnosis not present

## 2021-12-19 DIAGNOSIS — S0990XA Unspecified injury of head, initial encounter: Secondary | ICD-10-CM | POA: Diagnosis not present

## 2021-12-19 DIAGNOSIS — R2689 Other abnormalities of gait and mobility: Secondary | ICD-10-CM | POA: Diagnosis not present

## 2021-12-19 DIAGNOSIS — Z87440 Personal history of urinary (tract) infections: Secondary | ICD-10-CM | POA: Diagnosis not present

## 2021-12-19 DIAGNOSIS — A419 Sepsis, unspecified organism: Secondary | ICD-10-CM | POA: Diagnosis not present

## 2021-12-19 DIAGNOSIS — R41841 Cognitive communication deficit: Secondary | ICD-10-CM | POA: Diagnosis not present

## 2021-12-20 DIAGNOSIS — L97422 Non-pressure chronic ulcer of left heel and midfoot with fat layer exposed: Secondary | ICD-10-CM | POA: Diagnosis not present

## 2021-12-20 DIAGNOSIS — M6281 Muscle weakness (generalized): Secondary | ICD-10-CM | POA: Diagnosis not present

## 2021-12-20 DIAGNOSIS — A419 Sepsis, unspecified organism: Secondary | ICD-10-CM | POA: Diagnosis not present

## 2021-12-20 DIAGNOSIS — R2689 Other abnormalities of gait and mobility: Secondary | ICD-10-CM | POA: Diagnosis not present

## 2021-12-20 DIAGNOSIS — G9341 Metabolic encephalopathy: Secondary | ICD-10-CM | POA: Diagnosis not present

## 2021-12-20 DIAGNOSIS — R262 Difficulty in walking, not elsewhere classified: Secondary | ICD-10-CM | POA: Diagnosis not present

## 2021-12-20 DIAGNOSIS — R41841 Cognitive communication deficit: Secondary | ICD-10-CM | POA: Diagnosis not present

## 2021-12-20 DIAGNOSIS — L89150 Pressure ulcer of sacral region, unstageable: Secondary | ICD-10-CM | POA: Diagnosis not present

## 2021-12-20 DIAGNOSIS — R2681 Unsteadiness on feet: Secondary | ICD-10-CM | POA: Diagnosis not present

## 2021-12-21 DIAGNOSIS — E118 Type 2 diabetes mellitus with unspecified complications: Secondary | ICD-10-CM | POA: Diagnosis not present

## 2021-12-21 DIAGNOSIS — K648 Other hemorrhoids: Secondary | ICD-10-CM | POA: Diagnosis not present

## 2021-12-21 DIAGNOSIS — R262 Difficulty in walking, not elsewhere classified: Secondary | ICD-10-CM | POA: Diagnosis not present

## 2021-12-21 DIAGNOSIS — A419 Sepsis, unspecified organism: Secondary | ICD-10-CM | POA: Diagnosis not present

## 2021-12-21 DIAGNOSIS — E11621 Type 2 diabetes mellitus with foot ulcer: Secondary | ICD-10-CM | POA: Diagnosis not present

## 2021-12-21 DIAGNOSIS — E6609 Other obesity due to excess calories: Secondary | ICD-10-CM | POA: Diagnosis not present

## 2021-12-21 DIAGNOSIS — R2681 Unsteadiness on feet: Secondary | ICD-10-CM | POA: Diagnosis not present

## 2021-12-21 DIAGNOSIS — M6281 Muscle weakness (generalized): Secondary | ICD-10-CM | POA: Diagnosis not present

## 2021-12-21 DIAGNOSIS — R2689 Other abnormalities of gait and mobility: Secondary | ICD-10-CM | POA: Diagnosis not present

## 2021-12-21 DIAGNOSIS — U071 COVID-19: Secondary | ICD-10-CM | POA: Diagnosis not present

## 2021-12-21 DIAGNOSIS — R41841 Cognitive communication deficit: Secondary | ICD-10-CM | POA: Diagnosis not present

## 2021-12-21 DIAGNOSIS — G9341 Metabolic encephalopathy: Secondary | ICD-10-CM | POA: Diagnosis not present

## 2021-12-21 DIAGNOSIS — G8929 Other chronic pain: Secondary | ICD-10-CM | POA: Diagnosis not present

## 2021-12-22 DIAGNOSIS — E11621 Type 2 diabetes mellitus with foot ulcer: Secondary | ICD-10-CM | POA: Diagnosis not present

## 2021-12-25 DIAGNOSIS — R2689 Other abnormalities of gait and mobility: Secondary | ICD-10-CM | POA: Diagnosis not present

## 2021-12-25 DIAGNOSIS — G9341 Metabolic encephalopathy: Secondary | ICD-10-CM | POA: Diagnosis not present

## 2021-12-25 DIAGNOSIS — A419 Sepsis, unspecified organism: Secondary | ICD-10-CM | POA: Diagnosis not present

## 2021-12-25 DIAGNOSIS — R41841 Cognitive communication deficit: Secondary | ICD-10-CM | POA: Diagnosis not present

## 2021-12-25 DIAGNOSIS — M6281 Muscle weakness (generalized): Secondary | ICD-10-CM | POA: Diagnosis not present

## 2021-12-25 DIAGNOSIS — R2681 Unsteadiness on feet: Secondary | ICD-10-CM | POA: Diagnosis not present

## 2021-12-25 DIAGNOSIS — R262 Difficulty in walking, not elsewhere classified: Secondary | ICD-10-CM | POA: Diagnosis not present

## 2021-12-26 DIAGNOSIS — R2689 Other abnormalities of gait and mobility: Secondary | ICD-10-CM | POA: Diagnosis not present

## 2021-12-26 DIAGNOSIS — G9341 Metabolic encephalopathy: Secondary | ICD-10-CM | POA: Diagnosis not present

## 2021-12-26 DIAGNOSIS — M6281 Muscle weakness (generalized): Secondary | ICD-10-CM | POA: Diagnosis not present

## 2021-12-26 DIAGNOSIS — R41841 Cognitive communication deficit: Secondary | ICD-10-CM | POA: Diagnosis not present

## 2021-12-26 DIAGNOSIS — R2681 Unsteadiness on feet: Secondary | ICD-10-CM | POA: Diagnosis not present

## 2021-12-26 DIAGNOSIS — R262 Difficulty in walking, not elsewhere classified: Secondary | ICD-10-CM | POA: Diagnosis not present

## 2021-12-26 DIAGNOSIS — A419 Sepsis, unspecified organism: Secondary | ICD-10-CM | POA: Diagnosis not present

## 2021-12-27 DIAGNOSIS — R2689 Other abnormalities of gait and mobility: Secondary | ICD-10-CM | POA: Diagnosis not present

## 2021-12-27 DIAGNOSIS — R2681 Unsteadiness on feet: Secondary | ICD-10-CM | POA: Diagnosis not present

## 2021-12-27 DIAGNOSIS — M6281 Muscle weakness (generalized): Secondary | ICD-10-CM | POA: Diagnosis not present

## 2021-12-27 DIAGNOSIS — L89150 Pressure ulcer of sacral region, unstageable: Secondary | ICD-10-CM | POA: Diagnosis not present

## 2021-12-27 DIAGNOSIS — L97422 Non-pressure chronic ulcer of left heel and midfoot with fat layer exposed: Secondary | ICD-10-CM | POA: Diagnosis not present

## 2021-12-27 DIAGNOSIS — R41841 Cognitive communication deficit: Secondary | ICD-10-CM | POA: Diagnosis not present

## 2021-12-27 DIAGNOSIS — R262 Difficulty in walking, not elsewhere classified: Secondary | ICD-10-CM | POA: Diagnosis not present

## 2021-12-27 DIAGNOSIS — R51 Headache with orthostatic component, not elsewhere classified: Secondary | ICD-10-CM | POA: Diagnosis not present

## 2021-12-27 DIAGNOSIS — A419 Sepsis, unspecified organism: Secondary | ICD-10-CM | POA: Diagnosis not present

## 2021-12-27 DIAGNOSIS — G9341 Metabolic encephalopathy: Secondary | ICD-10-CM | POA: Diagnosis not present

## 2021-12-29 DIAGNOSIS — E118 Type 2 diabetes mellitus with unspecified complications: Secondary | ICD-10-CM | POA: Diagnosis not present

## 2021-12-29 DIAGNOSIS — U071 COVID-19: Secondary | ICD-10-CM | POA: Diagnosis not present

## 2021-12-29 DIAGNOSIS — E6609 Other obesity due to excess calories: Secondary | ICD-10-CM | POA: Diagnosis not present

## 2021-12-29 DIAGNOSIS — G8929 Other chronic pain: Secondary | ICD-10-CM | POA: Diagnosis not present

## 2021-12-29 DIAGNOSIS — K648 Other hemorrhoids: Secondary | ICD-10-CM | POA: Diagnosis not present

## 2021-12-29 DIAGNOSIS — E11621 Type 2 diabetes mellitus with foot ulcer: Secondary | ICD-10-CM | POA: Diagnosis not present

## 2021-12-29 DIAGNOSIS — M6281 Muscle weakness (generalized): Secondary | ICD-10-CM | POA: Diagnosis not present

## 2022-01-03 DIAGNOSIS — L89150 Pressure ulcer of sacral region, unstageable: Secondary | ICD-10-CM | POA: Diagnosis not present

## 2022-01-03 DIAGNOSIS — L97422 Non-pressure chronic ulcer of left heel and midfoot with fat layer exposed: Secondary | ICD-10-CM | POA: Diagnosis not present

## 2022-01-06 DIAGNOSIS — I4891 Unspecified atrial fibrillation: Secondary | ICD-10-CM | POA: Diagnosis not present

## 2022-01-06 DIAGNOSIS — M48061 Spinal stenosis, lumbar region without neurogenic claudication: Secondary | ICD-10-CM | POA: Diagnosis not present

## 2022-01-06 DIAGNOSIS — L97409 Non-pressure chronic ulcer of unspecified heel and midfoot with unspecified severity: Secondary | ICD-10-CM | POA: Diagnosis not present

## 2022-01-06 DIAGNOSIS — R4182 Altered mental status, unspecified: Secondary | ICD-10-CM | POA: Diagnosis not present

## 2022-01-06 DIAGNOSIS — Z87440 Personal history of urinary (tract) infections: Secondary | ICD-10-CM | POA: Diagnosis not present

## 2022-01-10 DIAGNOSIS — L89153 Pressure ulcer of sacral region, stage 3: Secondary | ICD-10-CM | POA: Diagnosis not present

## 2022-01-10 DIAGNOSIS — N390 Urinary tract infection, site not specified: Secondary | ICD-10-CM | POA: Diagnosis not present

## 2022-01-10 DIAGNOSIS — L97422 Non-pressure chronic ulcer of left heel and midfoot with fat layer exposed: Secondary | ICD-10-CM | POA: Diagnosis not present

## 2022-01-12 DIAGNOSIS — R399 Unspecified symptoms and signs involving the genitourinary system: Secondary | ICD-10-CM | POA: Diagnosis not present

## 2022-01-12 DIAGNOSIS — R195 Other fecal abnormalities: Secondary | ICD-10-CM | POA: Diagnosis not present

## 2022-01-12 DIAGNOSIS — R6889 Other general symptoms and signs: Secondary | ICD-10-CM | POA: Diagnosis not present

## 2022-01-12 DIAGNOSIS — I152 Hypertension secondary to endocrine disorders: Secondary | ICD-10-CM | POA: Diagnosis not present

## 2022-01-13 DIAGNOSIS — R634 Abnormal weight loss: Secondary | ICD-10-CM | POA: Diagnosis not present

## 2022-01-13 DIAGNOSIS — N390 Urinary tract infection, site not specified: Secondary | ICD-10-CM | POA: Diagnosis not present

## 2022-01-13 DIAGNOSIS — B9620 Unspecified Escherichia coli [E. coli] as the cause of diseases classified elsewhere: Secondary | ICD-10-CM | POA: Diagnosis not present

## 2022-01-17 DIAGNOSIS — L89153 Pressure ulcer of sacral region, stage 3: Secondary | ICD-10-CM | POA: Diagnosis not present

## 2022-01-17 DIAGNOSIS — L97422 Non-pressure chronic ulcer of left heel and midfoot with fat layer exposed: Secondary | ICD-10-CM | POA: Diagnosis not present

## 2022-01-18 DIAGNOSIS — M6281 Muscle weakness (generalized): Secondary | ICD-10-CM | POA: Diagnosis not present

## 2022-01-18 DIAGNOSIS — A419 Sepsis, unspecified organism: Secondary | ICD-10-CM | POA: Diagnosis not present

## 2022-01-18 DIAGNOSIS — R293 Abnormal posture: Secondary | ICD-10-CM | POA: Diagnosis not present

## 2022-01-19 DIAGNOSIS — A419 Sepsis, unspecified organism: Secondary | ICD-10-CM | POA: Diagnosis not present

## 2022-01-19 DIAGNOSIS — L89893 Pressure ulcer of other site, stage 3: Secondary | ICD-10-CM | POA: Diagnosis not present

## 2022-01-19 DIAGNOSIS — R293 Abnormal posture: Secondary | ICD-10-CM | POA: Diagnosis not present

## 2022-01-19 DIAGNOSIS — M6281 Muscle weakness (generalized): Secondary | ICD-10-CM | POA: Diagnosis not present

## 2022-01-20 DIAGNOSIS — M6281 Muscle weakness (generalized): Secondary | ICD-10-CM | POA: Diagnosis not present

## 2022-01-20 DIAGNOSIS — R293 Abnormal posture: Secondary | ICD-10-CM | POA: Diagnosis not present

## 2022-01-20 DIAGNOSIS — A419 Sepsis, unspecified organism: Secondary | ICD-10-CM | POA: Diagnosis not present

## 2022-01-23 DIAGNOSIS — R293 Abnormal posture: Secondary | ICD-10-CM | POA: Diagnosis not present

## 2022-01-23 DIAGNOSIS — M6281 Muscle weakness (generalized): Secondary | ICD-10-CM | POA: Diagnosis not present

## 2022-01-23 DIAGNOSIS — A419 Sepsis, unspecified organism: Secondary | ICD-10-CM | POA: Diagnosis not present

## 2022-01-24 DIAGNOSIS — R293 Abnormal posture: Secondary | ICD-10-CM | POA: Diagnosis not present

## 2022-01-24 DIAGNOSIS — L89153 Pressure ulcer of sacral region, stage 3: Secondary | ICD-10-CM | POA: Diagnosis not present

## 2022-01-24 DIAGNOSIS — M6281 Muscle weakness (generalized): Secondary | ICD-10-CM | POA: Diagnosis not present

## 2022-01-24 DIAGNOSIS — A419 Sepsis, unspecified organism: Secondary | ICD-10-CM | POA: Diagnosis not present

## 2022-01-25 DIAGNOSIS — A419 Sepsis, unspecified organism: Secondary | ICD-10-CM | POA: Diagnosis not present

## 2022-01-25 DIAGNOSIS — M6281 Muscle weakness (generalized): Secondary | ICD-10-CM | POA: Diagnosis not present

## 2022-01-25 DIAGNOSIS — R293 Abnormal posture: Secondary | ICD-10-CM | POA: Diagnosis not present

## 2022-01-26 DIAGNOSIS — A419 Sepsis, unspecified organism: Secondary | ICD-10-CM | POA: Diagnosis not present

## 2022-01-26 DIAGNOSIS — M6281 Muscle weakness (generalized): Secondary | ICD-10-CM | POA: Diagnosis not present

## 2022-01-26 DIAGNOSIS — R293 Abnormal posture: Secondary | ICD-10-CM | POA: Diagnosis not present

## 2022-01-27 DIAGNOSIS — R293 Abnormal posture: Secondary | ICD-10-CM | POA: Diagnosis not present

## 2022-01-27 DIAGNOSIS — N390 Urinary tract infection, site not specified: Secondary | ICD-10-CM | POA: Diagnosis not present

## 2022-01-27 DIAGNOSIS — A419 Sepsis, unspecified organism: Secondary | ICD-10-CM | POA: Diagnosis not present

## 2022-01-27 DIAGNOSIS — M48061 Spinal stenosis, lumbar region without neurogenic claudication: Secondary | ICD-10-CM | POA: Diagnosis not present

## 2022-01-27 DIAGNOSIS — I4891 Unspecified atrial fibrillation: Secondary | ICD-10-CM | POA: Diagnosis not present

## 2022-01-27 DIAGNOSIS — Q670 Congenital facial asymmetry: Secondary | ICD-10-CM | POA: Diagnosis not present

## 2022-01-27 DIAGNOSIS — R5381 Other malaise: Secondary | ICD-10-CM | POA: Diagnosis not present

## 2022-01-27 DIAGNOSIS — I4892 Unspecified atrial flutter: Secondary | ICD-10-CM | POA: Diagnosis not present

## 2022-01-27 DIAGNOSIS — I152 Hypertension secondary to endocrine disorders: Secondary | ICD-10-CM | POA: Diagnosis not present

## 2022-01-27 DIAGNOSIS — M6281 Muscle weakness (generalized): Secondary | ICD-10-CM | POA: Diagnosis not present

## 2022-01-27 DIAGNOSIS — E1159 Type 2 diabetes mellitus with other circulatory complications: Secondary | ICD-10-CM | POA: Diagnosis not present

## 2022-01-27 DIAGNOSIS — D329 Benign neoplasm of meninges, unspecified: Secondary | ICD-10-CM | POA: Diagnosis not present

## 2022-01-30 DIAGNOSIS — Z1331 Encounter for screening for depression: Secondary | ICD-10-CM | POA: Diagnosis not present

## 2022-01-30 DIAGNOSIS — R4701 Aphasia: Secondary | ICD-10-CM | POA: Diagnosis not present

## 2022-01-30 DIAGNOSIS — A419 Sepsis, unspecified organism: Secondary | ICD-10-CM | POA: Diagnosis not present

## 2022-01-30 DIAGNOSIS — M6281 Muscle weakness (generalized): Secondary | ICD-10-CM | POA: Diagnosis not present

## 2022-01-30 DIAGNOSIS — M79601 Pain in right arm: Secondary | ICD-10-CM | POA: Diagnosis not present

## 2022-01-30 DIAGNOSIS — M48061 Spinal stenosis, lumbar region without neurogenic claudication: Secondary | ICD-10-CM | POA: Diagnosis not present

## 2022-01-30 DIAGNOSIS — G9341 Metabolic encephalopathy: Secondary | ICD-10-CM | POA: Diagnosis not present

## 2022-01-30 DIAGNOSIS — R293 Abnormal posture: Secondary | ICD-10-CM | POA: Diagnosis not present

## 2022-01-30 DIAGNOSIS — I152 Hypertension secondary to endocrine disorders: Secondary | ICD-10-CM | POA: Diagnosis not present

## 2022-01-31 DIAGNOSIS — G9341 Metabolic encephalopathy: Secondary | ICD-10-CM | POA: Diagnosis not present

## 2022-01-31 DIAGNOSIS — M6281 Muscle weakness (generalized): Secondary | ICD-10-CM | POA: Diagnosis not present

## 2022-01-31 DIAGNOSIS — A419 Sepsis, unspecified organism: Secondary | ICD-10-CM | POA: Diagnosis not present

## 2022-01-31 DIAGNOSIS — R293 Abnormal posture: Secondary | ICD-10-CM | POA: Diagnosis not present

## 2022-02-01 DIAGNOSIS — R293 Abnormal posture: Secondary | ICD-10-CM | POA: Diagnosis not present

## 2022-02-01 DIAGNOSIS — A419 Sepsis, unspecified organism: Secondary | ICD-10-CM | POA: Diagnosis not present

## 2022-02-01 DIAGNOSIS — G9341 Metabolic encephalopathy: Secondary | ICD-10-CM | POA: Diagnosis not present

## 2022-02-01 DIAGNOSIS — M6281 Muscle weakness (generalized): Secondary | ICD-10-CM | POA: Diagnosis not present

## 2022-02-02 DIAGNOSIS — A419 Sepsis, unspecified organism: Secondary | ICD-10-CM | POA: Diagnosis not present

## 2022-02-02 DIAGNOSIS — G9341 Metabolic encephalopathy: Secondary | ICD-10-CM | POA: Diagnosis not present

## 2022-02-02 DIAGNOSIS — R293 Abnormal posture: Secondary | ICD-10-CM | POA: Diagnosis not present

## 2022-02-02 DIAGNOSIS — M6281 Muscle weakness (generalized): Secondary | ICD-10-CM | POA: Diagnosis not present

## 2022-02-04 DIAGNOSIS — G9341 Metabolic encephalopathy: Secondary | ICD-10-CM | POA: Diagnosis not present

## 2022-02-04 DIAGNOSIS — R293 Abnormal posture: Secondary | ICD-10-CM | POA: Diagnosis not present

## 2022-02-04 DIAGNOSIS — A419 Sepsis, unspecified organism: Secondary | ICD-10-CM | POA: Diagnosis not present

## 2022-02-04 DIAGNOSIS — M6281 Muscle weakness (generalized): Secondary | ICD-10-CM | POA: Diagnosis not present

## 2022-02-06 DIAGNOSIS — M6281 Muscle weakness (generalized): Secondary | ICD-10-CM | POA: Diagnosis not present

## 2022-02-06 DIAGNOSIS — G9341 Metabolic encephalopathy: Secondary | ICD-10-CM | POA: Diagnosis not present

## 2022-02-06 DIAGNOSIS — R293 Abnormal posture: Secondary | ICD-10-CM | POA: Diagnosis not present

## 2022-02-06 DIAGNOSIS — A419 Sepsis, unspecified organism: Secondary | ICD-10-CM | POA: Diagnosis not present

## 2022-02-07 ENCOUNTER — Encounter: Payer: Self-pay | Admitting: Family Medicine

## 2022-02-07 DIAGNOSIS — I152 Hypertension secondary to endocrine disorders: Secondary | ICD-10-CM | POA: Diagnosis not present

## 2022-02-07 DIAGNOSIS — A419 Sepsis, unspecified organism: Secondary | ICD-10-CM | POA: Diagnosis not present

## 2022-02-07 DIAGNOSIS — G9341 Metabolic encephalopathy: Secondary | ICD-10-CM | POA: Diagnosis not present

## 2022-02-07 DIAGNOSIS — M6281 Muscle weakness (generalized): Secondary | ICD-10-CM | POA: Diagnosis not present

## 2022-02-07 DIAGNOSIS — Z1331 Encounter for screening for depression: Secondary | ICD-10-CM | POA: Diagnosis not present

## 2022-02-07 DIAGNOSIS — L89153 Pressure ulcer of sacral region, stage 3: Secondary | ICD-10-CM | POA: Diagnosis not present

## 2022-02-07 DIAGNOSIS — F40232 Fear of other medical care: Secondary | ICD-10-CM | POA: Diagnosis not present

## 2022-02-07 DIAGNOSIS — R293 Abnormal posture: Secondary | ICD-10-CM | POA: Diagnosis not present

## 2022-02-07 DIAGNOSIS — D509 Iron deficiency anemia, unspecified: Secondary | ICD-10-CM | POA: Diagnosis not present

## 2022-02-08 ENCOUNTER — Encounter: Payer: Self-pay | Admitting: Family Medicine

## 2022-02-09 DIAGNOSIS — M6281 Muscle weakness (generalized): Secondary | ICD-10-CM | POA: Diagnosis not present

## 2022-02-09 DIAGNOSIS — G9341 Metabolic encephalopathy: Secondary | ICD-10-CM | POA: Diagnosis not present

## 2022-02-09 DIAGNOSIS — Z711 Person with feared health complaint in whom no diagnosis is made: Secondary | ICD-10-CM | POA: Diagnosis not present

## 2022-02-09 DIAGNOSIS — Z9189 Other specified personal risk factors, not elsewhere classified: Secondary | ICD-10-CM | POA: Diagnosis not present

## 2022-02-09 DIAGNOSIS — A419 Sepsis, unspecified organism: Secondary | ICD-10-CM | POA: Diagnosis not present

## 2022-02-09 DIAGNOSIS — R293 Abnormal posture: Secondary | ICD-10-CM | POA: Diagnosis not present

## 2022-02-09 DIAGNOSIS — I152 Hypertension secondary to endocrine disorders: Secondary | ICD-10-CM | POA: Diagnosis not present

## 2022-02-10 DIAGNOSIS — E119 Type 2 diabetes mellitus without complications: Secondary | ICD-10-CM | POA: Diagnosis not present

## 2022-02-10 DIAGNOSIS — J310 Chronic rhinitis: Secondary | ICD-10-CM | POA: Diagnosis not present

## 2022-02-10 DIAGNOSIS — I679 Cerebrovascular disease, unspecified: Secondary | ICD-10-CM | POA: Diagnosis not present

## 2022-02-10 DIAGNOSIS — R0989 Other specified symptoms and signs involving the circulatory and respiratory systems: Secondary | ICD-10-CM | POA: Diagnosis not present

## 2022-02-10 DIAGNOSIS — G4720 Circadian rhythm sleep disorder, unspecified type: Secondary | ICD-10-CM | POA: Diagnosis not present

## 2022-02-13 DIAGNOSIS — G9341 Metabolic encephalopathy: Secondary | ICD-10-CM | POA: Diagnosis not present

## 2022-02-13 DIAGNOSIS — M6281 Muscle weakness (generalized): Secondary | ICD-10-CM | POA: Diagnosis not present

## 2022-02-13 DIAGNOSIS — R293 Abnormal posture: Secondary | ICD-10-CM | POA: Diagnosis not present

## 2022-02-13 DIAGNOSIS — A419 Sepsis, unspecified organism: Secondary | ICD-10-CM | POA: Diagnosis not present

## 2022-02-14 DIAGNOSIS — G9341 Metabolic encephalopathy: Secondary | ICD-10-CM | POA: Diagnosis not present

## 2022-02-14 DIAGNOSIS — L89153 Pressure ulcer of sacral region, stage 3: Secondary | ICD-10-CM | POA: Diagnosis not present

## 2022-02-14 DIAGNOSIS — M6281 Muscle weakness (generalized): Secondary | ICD-10-CM | POA: Diagnosis not present

## 2022-02-14 DIAGNOSIS — A419 Sepsis, unspecified organism: Secondary | ICD-10-CM | POA: Diagnosis not present

## 2022-02-14 DIAGNOSIS — R293 Abnormal posture: Secondary | ICD-10-CM | POA: Diagnosis not present

## 2022-02-15 DIAGNOSIS — G9341 Metabolic encephalopathy: Secondary | ICD-10-CM | POA: Diagnosis not present

## 2022-02-15 DIAGNOSIS — L89893 Pressure ulcer of other site, stage 3: Secondary | ICD-10-CM | POA: Diagnosis not present

## 2022-02-15 DIAGNOSIS — M6281 Muscle weakness (generalized): Secondary | ICD-10-CM | POA: Diagnosis not present

## 2022-02-15 DIAGNOSIS — R293 Abnormal posture: Secondary | ICD-10-CM | POA: Diagnosis not present

## 2022-02-15 DIAGNOSIS — A419 Sepsis, unspecified organism: Secondary | ICD-10-CM | POA: Diagnosis not present

## 2022-02-16 DIAGNOSIS — R293 Abnormal posture: Secondary | ICD-10-CM | POA: Diagnosis not present

## 2022-02-16 DIAGNOSIS — M6281 Muscle weakness (generalized): Secondary | ICD-10-CM | POA: Diagnosis not present

## 2022-02-16 DIAGNOSIS — G9341 Metabolic encephalopathy: Secondary | ICD-10-CM | POA: Diagnosis not present

## 2022-02-16 DIAGNOSIS — A419 Sepsis, unspecified organism: Secondary | ICD-10-CM | POA: Diagnosis not present

## 2022-02-21 DIAGNOSIS — M6281 Muscle weakness (generalized): Secondary | ICD-10-CM | POA: Diagnosis not present

## 2022-02-21 DIAGNOSIS — L89153 Pressure ulcer of sacral region, stage 3: Secondary | ICD-10-CM | POA: Diagnosis not present

## 2022-02-21 DIAGNOSIS — R293 Abnormal posture: Secondary | ICD-10-CM | POA: Diagnosis not present

## 2022-02-21 DIAGNOSIS — G9341 Metabolic encephalopathy: Secondary | ICD-10-CM | POA: Diagnosis not present

## 2022-02-21 DIAGNOSIS — A419 Sepsis, unspecified organism: Secondary | ICD-10-CM | POA: Diagnosis not present

## 2022-02-22 DIAGNOSIS — G9341 Metabolic encephalopathy: Secondary | ICD-10-CM | POA: Diagnosis not present

## 2022-02-22 DIAGNOSIS — A419 Sepsis, unspecified organism: Secondary | ICD-10-CM | POA: Diagnosis not present

## 2022-02-22 DIAGNOSIS — M6281 Muscle weakness (generalized): Secondary | ICD-10-CM | POA: Diagnosis not present

## 2022-02-22 DIAGNOSIS — R293 Abnormal posture: Secondary | ICD-10-CM | POA: Diagnosis not present

## 2022-02-23 DIAGNOSIS — G9341 Metabolic encephalopathy: Secondary | ICD-10-CM | POA: Diagnosis not present

## 2022-02-23 DIAGNOSIS — R293 Abnormal posture: Secondary | ICD-10-CM | POA: Diagnosis not present

## 2022-02-23 DIAGNOSIS — M6281 Muscle weakness (generalized): Secondary | ICD-10-CM | POA: Diagnosis not present

## 2022-02-23 DIAGNOSIS — A419 Sepsis, unspecified organism: Secondary | ICD-10-CM | POA: Diagnosis not present

## 2022-02-24 DIAGNOSIS — R293 Abnormal posture: Secondary | ICD-10-CM | POA: Diagnosis not present

## 2022-02-24 DIAGNOSIS — A419 Sepsis, unspecified organism: Secondary | ICD-10-CM | POA: Diagnosis not present

## 2022-02-24 DIAGNOSIS — M6281 Muscle weakness (generalized): Secondary | ICD-10-CM | POA: Diagnosis not present

## 2022-02-24 DIAGNOSIS — G9341 Metabolic encephalopathy: Secondary | ICD-10-CM | POA: Diagnosis not present

## 2022-02-27 DIAGNOSIS — M6281 Muscle weakness (generalized): Secondary | ICD-10-CM | POA: Diagnosis not present

## 2022-02-27 DIAGNOSIS — A419 Sepsis, unspecified organism: Secondary | ICD-10-CM | POA: Diagnosis not present

## 2022-02-27 DIAGNOSIS — R293 Abnormal posture: Secondary | ICD-10-CM | POA: Diagnosis not present

## 2022-02-27 DIAGNOSIS — G9341 Metabolic encephalopathy: Secondary | ICD-10-CM | POA: Diagnosis not present

## 2022-02-28 DIAGNOSIS — G9341 Metabolic encephalopathy: Secondary | ICD-10-CM | POA: Diagnosis not present

## 2022-02-28 DIAGNOSIS — A419 Sepsis, unspecified organism: Secondary | ICD-10-CM | POA: Diagnosis not present

## 2022-02-28 DIAGNOSIS — M6281 Muscle weakness (generalized): Secondary | ICD-10-CM | POA: Diagnosis not present

## 2022-02-28 DIAGNOSIS — L89153 Pressure ulcer of sacral region, stage 3: Secondary | ICD-10-CM | POA: Diagnosis not present

## 2022-02-28 DIAGNOSIS — R262 Difficulty in walking, not elsewhere classified: Secondary | ICD-10-CM | POA: Diagnosis not present

## 2022-03-01 DIAGNOSIS — G9341 Metabolic encephalopathy: Secondary | ICD-10-CM | POA: Diagnosis not present

## 2022-03-01 DIAGNOSIS — R262 Difficulty in walking, not elsewhere classified: Secondary | ICD-10-CM | POA: Diagnosis not present

## 2022-03-01 DIAGNOSIS — M6281 Muscle weakness (generalized): Secondary | ICD-10-CM | POA: Diagnosis not present

## 2022-03-01 DIAGNOSIS — A419 Sepsis, unspecified organism: Secondary | ICD-10-CM | POA: Diagnosis not present

## 2022-03-02 DIAGNOSIS — A419 Sepsis, unspecified organism: Secondary | ICD-10-CM | POA: Diagnosis not present

## 2022-03-02 DIAGNOSIS — G9341 Metabolic encephalopathy: Secondary | ICD-10-CM | POA: Diagnosis not present

## 2022-03-02 DIAGNOSIS — R262 Difficulty in walking, not elsewhere classified: Secondary | ICD-10-CM | POA: Diagnosis not present

## 2022-03-02 DIAGNOSIS — M6281 Muscle weakness (generalized): Secondary | ICD-10-CM | POA: Diagnosis not present

## 2022-03-03 DIAGNOSIS — G9341 Metabolic encephalopathy: Secondary | ICD-10-CM | POA: Diagnosis not present

## 2022-03-03 DIAGNOSIS — A419 Sepsis, unspecified organism: Secondary | ICD-10-CM | POA: Diagnosis not present

## 2022-03-03 DIAGNOSIS — M6281 Muscle weakness (generalized): Secondary | ICD-10-CM | POA: Diagnosis not present

## 2022-03-03 DIAGNOSIS — R262 Difficulty in walking, not elsewhere classified: Secondary | ICD-10-CM | POA: Diagnosis not present

## 2022-03-06 DIAGNOSIS — E1159 Type 2 diabetes mellitus with other circulatory complications: Secondary | ICD-10-CM | POA: Diagnosis not present

## 2022-03-06 DIAGNOSIS — R262 Difficulty in walking, not elsewhere classified: Secondary | ICD-10-CM | POA: Diagnosis not present

## 2022-03-06 DIAGNOSIS — Z8673 Personal history of transient ischemic attack (TIA), and cerebral infarction without residual deficits: Secondary | ICD-10-CM | POA: Diagnosis not present

## 2022-03-06 DIAGNOSIS — I152 Hypertension secondary to endocrine disorders: Secondary | ICD-10-CM | POA: Diagnosis not present

## 2022-03-06 DIAGNOSIS — G9341 Metabolic encephalopathy: Secondary | ICD-10-CM | POA: Diagnosis not present

## 2022-03-06 DIAGNOSIS — D509 Iron deficiency anemia, unspecified: Secondary | ICD-10-CM | POA: Diagnosis not present

## 2022-03-06 DIAGNOSIS — M48061 Spinal stenosis, lumbar region without neurogenic claudication: Secondary | ICD-10-CM | POA: Diagnosis not present

## 2022-03-06 DIAGNOSIS — A419 Sepsis, unspecified organism: Secondary | ICD-10-CM | POA: Diagnosis not present

## 2022-03-06 DIAGNOSIS — Z87440 Personal history of urinary (tract) infections: Secondary | ICD-10-CM | POA: Diagnosis not present

## 2022-03-06 DIAGNOSIS — M6281 Muscle weakness (generalized): Secondary | ICD-10-CM | POA: Diagnosis not present

## 2022-03-06 DIAGNOSIS — Z7984 Long term (current) use of oral hypoglycemic drugs: Secondary | ICD-10-CM | POA: Diagnosis not present

## 2022-03-07 DIAGNOSIS — L89153 Pressure ulcer of sacral region, stage 3: Secondary | ICD-10-CM | POA: Diagnosis not present

## 2022-03-07 DIAGNOSIS — A419 Sepsis, unspecified organism: Secondary | ICD-10-CM | POA: Diagnosis not present

## 2022-03-07 DIAGNOSIS — R262 Difficulty in walking, not elsewhere classified: Secondary | ICD-10-CM | POA: Diagnosis not present

## 2022-03-07 DIAGNOSIS — M6281 Muscle weakness (generalized): Secondary | ICD-10-CM | POA: Diagnosis not present

## 2022-03-07 DIAGNOSIS — G9341 Metabolic encephalopathy: Secondary | ICD-10-CM | POA: Diagnosis not present

## 2022-03-08 DIAGNOSIS — M6281 Muscle weakness (generalized): Secondary | ICD-10-CM | POA: Diagnosis not present

## 2022-03-08 DIAGNOSIS — G9341 Metabolic encephalopathy: Secondary | ICD-10-CM | POA: Diagnosis not present

## 2022-03-08 DIAGNOSIS — A419 Sepsis, unspecified organism: Secondary | ICD-10-CM | POA: Diagnosis not present

## 2022-03-08 DIAGNOSIS — R262 Difficulty in walking, not elsewhere classified: Secondary | ICD-10-CM | POA: Diagnosis not present

## 2022-03-09 DIAGNOSIS — A419 Sepsis, unspecified organism: Secondary | ICD-10-CM | POA: Diagnosis not present

## 2022-03-09 DIAGNOSIS — I4891 Unspecified atrial fibrillation: Secondary | ICD-10-CM | POA: Diagnosis not present

## 2022-03-09 DIAGNOSIS — M6281 Muscle weakness (generalized): Secondary | ICD-10-CM | POA: Diagnosis not present

## 2022-03-09 DIAGNOSIS — I152 Hypertension secondary to endocrine disorders: Secondary | ICD-10-CM | POA: Diagnosis not present

## 2022-03-09 DIAGNOSIS — R262 Difficulty in walking, not elsewhere classified: Secondary | ICD-10-CM | POA: Diagnosis not present

## 2022-03-09 DIAGNOSIS — G9341 Metabolic encephalopathy: Secondary | ICD-10-CM | POA: Diagnosis not present

## 2022-03-09 DIAGNOSIS — E1159 Type 2 diabetes mellitus with other circulatory complications: Secondary | ICD-10-CM | POA: Diagnosis not present

## 2022-03-10 DIAGNOSIS — A419 Sepsis, unspecified organism: Secondary | ICD-10-CM | POA: Diagnosis not present

## 2022-03-10 DIAGNOSIS — R262 Difficulty in walking, not elsewhere classified: Secondary | ICD-10-CM | POA: Diagnosis not present

## 2022-03-10 DIAGNOSIS — G9341 Metabolic encephalopathy: Secondary | ICD-10-CM | POA: Diagnosis not present

## 2022-03-10 DIAGNOSIS — M6281 Muscle weakness (generalized): Secondary | ICD-10-CM | POA: Diagnosis not present

## 2022-03-13 DIAGNOSIS — R262 Difficulty in walking, not elsewhere classified: Secondary | ICD-10-CM | POA: Diagnosis not present

## 2022-03-13 DIAGNOSIS — M6281 Muscle weakness (generalized): Secondary | ICD-10-CM | POA: Diagnosis not present

## 2022-03-13 DIAGNOSIS — A419 Sepsis, unspecified organism: Secondary | ICD-10-CM | POA: Diagnosis not present

## 2022-03-13 DIAGNOSIS — G9341 Metabolic encephalopathy: Secondary | ICD-10-CM | POA: Diagnosis not present

## 2022-03-14 DIAGNOSIS — M6281 Muscle weakness (generalized): Secondary | ICD-10-CM | POA: Diagnosis not present

## 2022-03-14 DIAGNOSIS — G9341 Metabolic encephalopathy: Secondary | ICD-10-CM | POA: Diagnosis not present

## 2022-03-14 DIAGNOSIS — R262 Difficulty in walking, not elsewhere classified: Secondary | ICD-10-CM | POA: Diagnosis not present

## 2022-03-14 DIAGNOSIS — L89153 Pressure ulcer of sacral region, stage 3: Secondary | ICD-10-CM | POA: Diagnosis not present

## 2022-03-14 DIAGNOSIS — A419 Sepsis, unspecified organism: Secondary | ICD-10-CM | POA: Diagnosis not present

## 2022-03-15 DIAGNOSIS — R262 Difficulty in walking, not elsewhere classified: Secondary | ICD-10-CM | POA: Diagnosis not present

## 2022-03-15 DIAGNOSIS — G9341 Metabolic encephalopathy: Secondary | ICD-10-CM | POA: Diagnosis not present

## 2022-03-15 DIAGNOSIS — A419 Sepsis, unspecified organism: Secondary | ICD-10-CM | POA: Diagnosis not present

## 2022-03-15 DIAGNOSIS — M6281 Muscle weakness (generalized): Secondary | ICD-10-CM | POA: Diagnosis not present

## 2022-03-16 DIAGNOSIS — G9341 Metabolic encephalopathy: Secondary | ICD-10-CM | POA: Diagnosis not present

## 2022-03-16 DIAGNOSIS — M6281 Muscle weakness (generalized): Secondary | ICD-10-CM | POA: Diagnosis not present

## 2022-03-16 DIAGNOSIS — L89893 Pressure ulcer of other site, stage 3: Secondary | ICD-10-CM | POA: Diagnosis not present

## 2022-03-16 DIAGNOSIS — R262 Difficulty in walking, not elsewhere classified: Secondary | ICD-10-CM | POA: Diagnosis not present

## 2022-03-16 DIAGNOSIS — A419 Sepsis, unspecified organism: Secondary | ICD-10-CM | POA: Diagnosis not present

## 2022-03-19 DIAGNOSIS — R262 Difficulty in walking, not elsewhere classified: Secondary | ICD-10-CM | POA: Diagnosis not present

## 2022-03-19 DIAGNOSIS — A419 Sepsis, unspecified organism: Secondary | ICD-10-CM | POA: Diagnosis not present

## 2022-03-19 DIAGNOSIS — G9341 Metabolic encephalopathy: Secondary | ICD-10-CM | POA: Diagnosis not present

## 2022-03-19 DIAGNOSIS — M6281 Muscle weakness (generalized): Secondary | ICD-10-CM | POA: Diagnosis not present

## 2022-03-21 DIAGNOSIS — A419 Sepsis, unspecified organism: Secondary | ICD-10-CM | POA: Diagnosis not present

## 2022-03-21 DIAGNOSIS — L89153 Pressure ulcer of sacral region, stage 3: Secondary | ICD-10-CM | POA: Diagnosis not present

## 2022-03-21 DIAGNOSIS — G9341 Metabolic encephalopathy: Secondary | ICD-10-CM | POA: Diagnosis not present

## 2022-03-21 DIAGNOSIS — R262 Difficulty in walking, not elsewhere classified: Secondary | ICD-10-CM | POA: Diagnosis not present

## 2022-03-21 DIAGNOSIS — M6281 Muscle weakness (generalized): Secondary | ICD-10-CM | POA: Diagnosis not present

## 2022-03-22 DIAGNOSIS — A419 Sepsis, unspecified organism: Secondary | ICD-10-CM | POA: Diagnosis not present

## 2022-03-22 DIAGNOSIS — G9341 Metabolic encephalopathy: Secondary | ICD-10-CM | POA: Diagnosis not present

## 2022-03-22 DIAGNOSIS — R262 Difficulty in walking, not elsewhere classified: Secondary | ICD-10-CM | POA: Diagnosis not present

## 2022-03-22 DIAGNOSIS — M6281 Muscle weakness (generalized): Secondary | ICD-10-CM | POA: Diagnosis not present

## 2022-03-24 DIAGNOSIS — E6609 Other obesity due to excess calories: Secondary | ICD-10-CM | POA: Diagnosis not present

## 2022-03-24 DIAGNOSIS — M6281 Muscle weakness (generalized): Secondary | ICD-10-CM | POA: Diagnosis not present

## 2022-03-24 DIAGNOSIS — U071 COVID-19: Secondary | ICD-10-CM | POA: Diagnosis not present

## 2022-03-24 DIAGNOSIS — R262 Difficulty in walking, not elsewhere classified: Secondary | ICD-10-CM | POA: Diagnosis not present

## 2022-03-24 DIAGNOSIS — G8929 Other chronic pain: Secondary | ICD-10-CM | POA: Diagnosis not present

## 2022-03-24 DIAGNOSIS — K648 Other hemorrhoids: Secondary | ICD-10-CM | POA: Diagnosis not present

## 2022-03-24 DIAGNOSIS — E11621 Type 2 diabetes mellitus with foot ulcer: Secondary | ICD-10-CM | POA: Diagnosis not present

## 2022-03-24 DIAGNOSIS — G9341 Metabolic encephalopathy: Secondary | ICD-10-CM | POA: Diagnosis not present

## 2022-03-24 DIAGNOSIS — E118 Type 2 diabetes mellitus with unspecified complications: Secondary | ICD-10-CM | POA: Diagnosis not present

## 2022-03-24 DIAGNOSIS — A419 Sepsis, unspecified organism: Secondary | ICD-10-CM | POA: Diagnosis not present

## 2022-03-27 DIAGNOSIS — G9341 Metabolic encephalopathy: Secondary | ICD-10-CM | POA: Diagnosis not present

## 2022-03-27 DIAGNOSIS — R262 Difficulty in walking, not elsewhere classified: Secondary | ICD-10-CM | POA: Diagnosis not present

## 2022-03-27 DIAGNOSIS — M6281 Muscle weakness (generalized): Secondary | ICD-10-CM | POA: Diagnosis not present

## 2022-03-27 DIAGNOSIS — A419 Sepsis, unspecified organism: Secondary | ICD-10-CM | POA: Diagnosis not present

## 2022-03-28 DIAGNOSIS — G9341 Metabolic encephalopathy: Secondary | ICD-10-CM | POA: Diagnosis not present

## 2022-03-28 DIAGNOSIS — R262 Difficulty in walking, not elsewhere classified: Secondary | ICD-10-CM | POA: Diagnosis not present

## 2022-03-28 DIAGNOSIS — L89153 Pressure ulcer of sacral region, stage 3: Secondary | ICD-10-CM | POA: Diagnosis not present

## 2022-03-28 DIAGNOSIS — A419 Sepsis, unspecified organism: Secondary | ICD-10-CM | POA: Diagnosis not present

## 2022-03-28 DIAGNOSIS — M6281 Muscle weakness (generalized): Secondary | ICD-10-CM | POA: Diagnosis not present

## 2022-03-29 DIAGNOSIS — M6281 Muscle weakness (generalized): Secondary | ICD-10-CM | POA: Diagnosis not present

## 2022-03-29 DIAGNOSIS — R262 Difficulty in walking, not elsewhere classified: Secondary | ICD-10-CM | POA: Diagnosis not present

## 2022-03-29 DIAGNOSIS — Z23 Encounter for immunization: Secondary | ICD-10-CM | POA: Diagnosis not present

## 2022-03-29 DIAGNOSIS — G9341 Metabolic encephalopathy: Secondary | ICD-10-CM | POA: Diagnosis not present

## 2022-03-29 DIAGNOSIS — A419 Sepsis, unspecified organism: Secondary | ICD-10-CM | POA: Diagnosis not present

## 2022-03-30 DIAGNOSIS — A419 Sepsis, unspecified organism: Secondary | ICD-10-CM | POA: Diagnosis not present

## 2022-03-30 DIAGNOSIS — R262 Difficulty in walking, not elsewhere classified: Secondary | ICD-10-CM | POA: Diagnosis not present

## 2022-03-30 DIAGNOSIS — M6281 Muscle weakness (generalized): Secondary | ICD-10-CM | POA: Diagnosis not present

## 2022-03-30 DIAGNOSIS — G9341 Metabolic encephalopathy: Secondary | ICD-10-CM | POA: Diagnosis not present

## 2022-03-31 DIAGNOSIS — R262 Difficulty in walking, not elsewhere classified: Secondary | ICD-10-CM | POA: Diagnosis not present

## 2022-03-31 DIAGNOSIS — M6281 Muscle weakness (generalized): Secondary | ICD-10-CM | POA: Diagnosis not present

## 2022-04-03 DIAGNOSIS — R262 Difficulty in walking, not elsewhere classified: Secondary | ICD-10-CM | POA: Diagnosis not present

## 2022-04-03 DIAGNOSIS — M6281 Muscle weakness (generalized): Secondary | ICD-10-CM | POA: Diagnosis not present

## 2022-04-04 DIAGNOSIS — M6281 Muscle weakness (generalized): Secondary | ICD-10-CM | POA: Diagnosis not present

## 2022-04-04 DIAGNOSIS — L89153 Pressure ulcer of sacral region, stage 3: Secondary | ICD-10-CM | POA: Diagnosis not present

## 2022-04-04 DIAGNOSIS — R262 Difficulty in walking, not elsewhere classified: Secondary | ICD-10-CM | POA: Diagnosis not present

## 2022-04-05 DIAGNOSIS — R262 Difficulty in walking, not elsewhere classified: Secondary | ICD-10-CM | POA: Diagnosis not present

## 2022-04-05 DIAGNOSIS — M6281 Muscle weakness (generalized): Secondary | ICD-10-CM | POA: Diagnosis not present

## 2022-04-06 DIAGNOSIS — M6281 Muscle weakness (generalized): Secondary | ICD-10-CM | POA: Diagnosis not present

## 2022-04-06 DIAGNOSIS — R262 Difficulty in walking, not elsewhere classified: Secondary | ICD-10-CM | POA: Diagnosis not present

## 2022-04-09 DIAGNOSIS — R262 Difficulty in walking, not elsewhere classified: Secondary | ICD-10-CM | POA: Diagnosis not present

## 2022-04-09 DIAGNOSIS — M6281 Muscle weakness (generalized): Secondary | ICD-10-CM | POA: Diagnosis not present

## 2022-04-10 DIAGNOSIS — M6281 Muscle weakness (generalized): Secondary | ICD-10-CM | POA: Diagnosis not present

## 2022-04-10 DIAGNOSIS — R262 Difficulty in walking, not elsewhere classified: Secondary | ICD-10-CM | POA: Diagnosis not present

## 2022-04-12 DIAGNOSIS — E11621 Type 2 diabetes mellitus with foot ulcer: Secondary | ICD-10-CM | POA: Diagnosis not present

## 2022-04-12 DIAGNOSIS — K648 Other hemorrhoids: Secondary | ICD-10-CM | POA: Diagnosis not present

## 2022-04-12 DIAGNOSIS — U071 COVID-19: Secondary | ICD-10-CM | POA: Diagnosis not present

## 2022-04-12 DIAGNOSIS — M6281 Muscle weakness (generalized): Secondary | ICD-10-CM | POA: Diagnosis not present

## 2022-04-12 DIAGNOSIS — R262 Difficulty in walking, not elsewhere classified: Secondary | ICD-10-CM | POA: Diagnosis not present

## 2022-04-12 DIAGNOSIS — E6609 Other obesity due to excess calories: Secondary | ICD-10-CM | POA: Diagnosis not present

## 2022-04-12 DIAGNOSIS — E118 Type 2 diabetes mellitus with unspecified complications: Secondary | ICD-10-CM | POA: Diagnosis not present

## 2022-04-12 DIAGNOSIS — G8929 Other chronic pain: Secondary | ICD-10-CM | POA: Diagnosis not present

## 2022-04-13 DIAGNOSIS — R262 Difficulty in walking, not elsewhere classified: Secondary | ICD-10-CM | POA: Diagnosis not present

## 2022-04-13 DIAGNOSIS — M6281 Muscle weakness (generalized): Secondary | ICD-10-CM | POA: Diagnosis not present

## 2022-04-14 DIAGNOSIS — M6281 Muscle weakness (generalized): Secondary | ICD-10-CM | POA: Diagnosis not present

## 2022-04-14 DIAGNOSIS — R262 Difficulty in walking, not elsewhere classified: Secondary | ICD-10-CM | POA: Diagnosis not present

## 2022-04-17 DIAGNOSIS — M6281 Muscle weakness (generalized): Secondary | ICD-10-CM | POA: Diagnosis not present

## 2022-04-17 DIAGNOSIS — R262 Difficulty in walking, not elsewhere classified: Secondary | ICD-10-CM | POA: Diagnosis not present

## 2022-04-18 ENCOUNTER — Telehealth: Payer: Self-pay | Admitting: Family Medicine

## 2022-04-18 DIAGNOSIS — M25562 Pain in left knee: Secondary | ICD-10-CM | POA: Diagnosis not present

## 2022-04-18 DIAGNOSIS — M6281 Muscle weakness (generalized): Secondary | ICD-10-CM | POA: Diagnosis not present

## 2022-04-18 DIAGNOSIS — I10 Essential (primary) hypertension: Secondary | ICD-10-CM | POA: Diagnosis not present

## 2022-04-18 DIAGNOSIS — K6289 Other specified diseases of anus and rectum: Secondary | ICD-10-CM | POA: Diagnosis not present

## 2022-04-18 DIAGNOSIS — B372 Candidiasis of skin and nail: Secondary | ICD-10-CM | POA: Diagnosis not present

## 2022-04-18 DIAGNOSIS — R262 Difficulty in walking, not elsewhere classified: Secondary | ICD-10-CM | POA: Diagnosis not present

## 2022-04-18 NOTE — Telephone Encounter (Signed)
Left message for patient to call back and schedule Medicare Annual Wellness Visit (AWV) either virtually or in office. I left my number for patient to call [phone].  Last AWV ;04/16/21  please schedule at anytime with health coach

## 2022-04-20 DIAGNOSIS — R262 Difficulty in walking, not elsewhere classified: Secondary | ICD-10-CM | POA: Diagnosis not present

## 2022-04-20 DIAGNOSIS — M6281 Muscle weakness (generalized): Secondary | ICD-10-CM | POA: Diagnosis not present

## 2022-04-21 DIAGNOSIS — R262 Difficulty in walking, not elsewhere classified: Secondary | ICD-10-CM | POA: Diagnosis not present

## 2022-04-21 DIAGNOSIS — M6281 Muscle weakness (generalized): Secondary | ICD-10-CM | POA: Diagnosis not present

## 2022-04-24 DIAGNOSIS — F5101 Primary insomnia: Secondary | ICD-10-CM | POA: Diagnosis not present

## 2022-04-24 DIAGNOSIS — F4321 Adjustment disorder with depressed mood: Secondary | ICD-10-CM | POA: Diagnosis not present

## 2022-05-02 DIAGNOSIS — R0982 Postnasal drip: Secondary | ICD-10-CM | POA: Diagnosis not present

## 2022-05-02 DIAGNOSIS — E0841 Diabetes mellitus due to underlying condition with diabetic mononeuropathy: Secondary | ICD-10-CM | POA: Diagnosis not present

## 2022-05-02 DIAGNOSIS — I4891 Unspecified atrial fibrillation: Secondary | ICD-10-CM | POA: Diagnosis not present

## 2022-05-02 DIAGNOSIS — M48061 Spinal stenosis, lumbar region without neurogenic claudication: Secondary | ICD-10-CM | POA: Diagnosis not present

## 2022-05-02 DIAGNOSIS — K529 Noninfective gastroenteritis and colitis, unspecified: Secondary | ICD-10-CM | POA: Diagnosis not present

## 2022-05-02 DIAGNOSIS — D638 Anemia in other chronic diseases classified elsewhere: Secondary | ICD-10-CM | POA: Diagnosis not present

## 2022-05-02 DIAGNOSIS — Z7185 Encounter for immunization safety counseling: Secondary | ICD-10-CM | POA: Diagnosis not present

## 2022-05-02 DIAGNOSIS — N390 Urinary tract infection, site not specified: Secondary | ICD-10-CM | POA: Diagnosis not present

## 2022-05-02 DIAGNOSIS — Z23 Encounter for immunization: Secondary | ICD-10-CM | POA: Diagnosis not present

## 2022-05-02 DIAGNOSIS — R635 Abnormal weight gain: Secondary | ICD-10-CM | POA: Diagnosis not present

## 2022-05-02 DIAGNOSIS — Z8673 Personal history of transient ischemic attack (TIA), and cerebral infarction without residual deficits: Secondary | ICD-10-CM | POA: Diagnosis not present

## 2022-05-06 DIAGNOSIS — E119 Type 2 diabetes mellitus without complications: Secondary | ICD-10-CM | POA: Diagnosis not present

## 2022-05-06 DIAGNOSIS — M6281 Muscle weakness (generalized): Secondary | ICD-10-CM | POA: Diagnosis not present

## 2022-05-06 DIAGNOSIS — R2681 Unsteadiness on feet: Secondary | ICD-10-CM | POA: Diagnosis not present

## 2022-05-09 DIAGNOSIS — M6281 Muscle weakness (generalized): Secondary | ICD-10-CM | POA: Diagnosis not present

## 2022-05-09 DIAGNOSIS — E119 Type 2 diabetes mellitus without complications: Secondary | ICD-10-CM | POA: Diagnosis not present

## 2022-05-09 DIAGNOSIS — R2681 Unsteadiness on feet: Secondary | ICD-10-CM | POA: Diagnosis not present

## 2022-05-10 DIAGNOSIS — E119 Type 2 diabetes mellitus without complications: Secondary | ICD-10-CM | POA: Diagnosis not present

## 2022-05-10 DIAGNOSIS — M6281 Muscle weakness (generalized): Secondary | ICD-10-CM | POA: Diagnosis not present

## 2022-05-10 DIAGNOSIS — R2681 Unsteadiness on feet: Secondary | ICD-10-CM | POA: Diagnosis not present

## 2022-05-11 DIAGNOSIS — E6609 Other obesity due to excess calories: Secondary | ICD-10-CM | POA: Diagnosis not present

## 2022-05-11 DIAGNOSIS — U071 COVID-19: Secondary | ICD-10-CM | POA: Diagnosis not present

## 2022-05-11 DIAGNOSIS — K648 Other hemorrhoids: Secondary | ICD-10-CM | POA: Diagnosis not present

## 2022-05-11 DIAGNOSIS — E11621 Type 2 diabetes mellitus with foot ulcer: Secondary | ICD-10-CM | POA: Diagnosis not present

## 2022-05-11 DIAGNOSIS — R2681 Unsteadiness on feet: Secondary | ICD-10-CM | POA: Diagnosis not present

## 2022-05-11 DIAGNOSIS — G8929 Other chronic pain: Secondary | ICD-10-CM | POA: Diagnosis not present

## 2022-05-11 DIAGNOSIS — E118 Type 2 diabetes mellitus with unspecified complications: Secondary | ICD-10-CM | POA: Diagnosis not present

## 2022-05-11 DIAGNOSIS — M6281 Muscle weakness (generalized): Secondary | ICD-10-CM | POA: Diagnosis not present

## 2022-05-11 DIAGNOSIS — K5900 Constipation, unspecified: Secondary | ICD-10-CM | POA: Diagnosis not present

## 2022-05-11 DIAGNOSIS — E119 Type 2 diabetes mellitus without complications: Secondary | ICD-10-CM | POA: Diagnosis not present

## 2022-05-12 DIAGNOSIS — E119 Type 2 diabetes mellitus without complications: Secondary | ICD-10-CM | POA: Diagnosis not present

## 2022-05-12 DIAGNOSIS — R2681 Unsteadiness on feet: Secondary | ICD-10-CM | POA: Diagnosis not present

## 2022-05-12 DIAGNOSIS — M6281 Muscle weakness (generalized): Secondary | ICD-10-CM | POA: Diagnosis not present

## 2022-05-15 DIAGNOSIS — E119 Type 2 diabetes mellitus without complications: Secondary | ICD-10-CM | POA: Diagnosis not present

## 2022-05-15 DIAGNOSIS — M6281 Muscle weakness (generalized): Secondary | ICD-10-CM | POA: Diagnosis not present

## 2022-05-15 DIAGNOSIS — F4321 Adjustment disorder with depressed mood: Secondary | ICD-10-CM | POA: Diagnosis not present

## 2022-05-15 DIAGNOSIS — F5101 Primary insomnia: Secondary | ICD-10-CM | POA: Diagnosis not present

## 2022-05-15 DIAGNOSIS — R2681 Unsteadiness on feet: Secondary | ICD-10-CM | POA: Diagnosis not present

## 2022-05-16 DIAGNOSIS — R2681 Unsteadiness on feet: Secondary | ICD-10-CM | POA: Diagnosis not present

## 2022-05-16 DIAGNOSIS — M6281 Muscle weakness (generalized): Secondary | ICD-10-CM | POA: Diagnosis not present

## 2022-05-16 DIAGNOSIS — F4321 Adjustment disorder with depressed mood: Secondary | ICD-10-CM | POA: Diagnosis not present

## 2022-05-16 DIAGNOSIS — E119 Type 2 diabetes mellitus without complications: Secondary | ICD-10-CM | POA: Diagnosis not present

## 2022-05-16 DIAGNOSIS — F5101 Primary insomnia: Secondary | ICD-10-CM | POA: Diagnosis not present

## 2022-05-17 DIAGNOSIS — R2681 Unsteadiness on feet: Secondary | ICD-10-CM | POA: Diagnosis not present

## 2022-05-17 DIAGNOSIS — U071 COVID-19: Secondary | ICD-10-CM | POA: Diagnosis not present

## 2022-05-17 DIAGNOSIS — K5900 Constipation, unspecified: Secondary | ICD-10-CM | POA: Diagnosis not present

## 2022-05-17 DIAGNOSIS — E118 Type 2 diabetes mellitus with unspecified complications: Secondary | ICD-10-CM | POA: Diagnosis not present

## 2022-05-17 DIAGNOSIS — K648 Other hemorrhoids: Secondary | ICD-10-CM | POA: Diagnosis not present

## 2022-05-17 DIAGNOSIS — M6281 Muscle weakness (generalized): Secondary | ICD-10-CM | POA: Diagnosis not present

## 2022-05-17 DIAGNOSIS — G8929 Other chronic pain: Secondary | ICD-10-CM | POA: Diagnosis not present

## 2022-05-17 DIAGNOSIS — E11621 Type 2 diabetes mellitus with foot ulcer: Secondary | ICD-10-CM | POA: Diagnosis not present

## 2022-05-17 DIAGNOSIS — E119 Type 2 diabetes mellitus without complications: Secondary | ICD-10-CM | POA: Diagnosis not present

## 2022-05-17 DIAGNOSIS — E6609 Other obesity due to excess calories: Secondary | ICD-10-CM | POA: Diagnosis not present

## 2022-05-19 DIAGNOSIS — M6281 Muscle weakness (generalized): Secondary | ICD-10-CM | POA: Diagnosis not present

## 2022-05-19 DIAGNOSIS — R2681 Unsteadiness on feet: Secondary | ICD-10-CM | POA: Diagnosis not present

## 2022-05-19 DIAGNOSIS — E119 Type 2 diabetes mellitus without complications: Secondary | ICD-10-CM | POA: Diagnosis not present

## 2022-05-22 DIAGNOSIS — M6281 Muscle weakness (generalized): Secondary | ICD-10-CM | POA: Diagnosis not present

## 2022-05-22 DIAGNOSIS — E119 Type 2 diabetes mellitus without complications: Secondary | ICD-10-CM | POA: Diagnosis not present

## 2022-05-22 DIAGNOSIS — R2681 Unsteadiness on feet: Secondary | ICD-10-CM | POA: Diagnosis not present

## 2022-05-23 DIAGNOSIS — M6281 Muscle weakness (generalized): Secondary | ICD-10-CM | POA: Diagnosis not present

## 2022-05-23 DIAGNOSIS — E119 Type 2 diabetes mellitus without complications: Secondary | ICD-10-CM | POA: Diagnosis not present

## 2022-05-23 DIAGNOSIS — R2681 Unsteadiness on feet: Secondary | ICD-10-CM | POA: Diagnosis not present

## 2022-05-24 ENCOUNTER — Telehealth: Payer: Self-pay | Admitting: Family Medicine

## 2022-05-24 DIAGNOSIS — K648 Other hemorrhoids: Secondary | ICD-10-CM | POA: Diagnosis not present

## 2022-05-24 DIAGNOSIS — M6281 Muscle weakness (generalized): Secondary | ICD-10-CM | POA: Diagnosis not present

## 2022-05-24 DIAGNOSIS — G8929 Other chronic pain: Secondary | ICD-10-CM | POA: Diagnosis not present

## 2022-05-24 DIAGNOSIS — E11621 Type 2 diabetes mellitus with foot ulcer: Secondary | ICD-10-CM | POA: Diagnosis not present

## 2022-05-24 DIAGNOSIS — U071 COVID-19: Secondary | ICD-10-CM | POA: Diagnosis not present

## 2022-05-24 DIAGNOSIS — E6609 Other obesity due to excess calories: Secondary | ICD-10-CM | POA: Diagnosis not present

## 2022-05-24 DIAGNOSIS — E118 Type 2 diabetes mellitus with unspecified complications: Secondary | ICD-10-CM | POA: Diagnosis not present

## 2022-05-24 DIAGNOSIS — K5900 Constipation, unspecified: Secondary | ICD-10-CM | POA: Diagnosis not present

## 2022-05-24 NOTE — Telephone Encounter (Signed)
Tried calling patient to schedule Medicare Annual Wellness Visit (AWV) either virtually or in office.   No answer  Last AWV 04/16/21 please schedule with Nurse Health Adviser   45 min for awv-i and in office appointments 30 min for awv-s  phone/virtual appointments

## 2022-05-25 DIAGNOSIS — N390 Urinary tract infection, site not specified: Secondary | ICD-10-CM | POA: Diagnosis not present

## 2022-05-25 DIAGNOSIS — M6281 Muscle weakness (generalized): Secondary | ICD-10-CM | POA: Diagnosis not present

## 2022-05-25 DIAGNOSIS — E119 Type 2 diabetes mellitus without complications: Secondary | ICD-10-CM | POA: Diagnosis not present

## 2022-05-25 DIAGNOSIS — R2681 Unsteadiness on feet: Secondary | ICD-10-CM | POA: Diagnosis not present

## 2022-05-26 DIAGNOSIS — I4891 Unspecified atrial fibrillation: Secondary | ICD-10-CM | POA: Diagnosis not present

## 2022-05-26 DIAGNOSIS — I152 Hypertension secondary to endocrine disorders: Secondary | ICD-10-CM | POA: Diagnosis not present

## 2022-05-26 DIAGNOSIS — R635 Abnormal weight gain: Secondary | ICD-10-CM | POA: Diagnosis not present

## 2022-05-26 DIAGNOSIS — J329 Chronic sinusitis, unspecified: Secondary | ICD-10-CM | POA: Diagnosis not present

## 2022-05-26 DIAGNOSIS — E119 Type 2 diabetes mellitus without complications: Secondary | ICD-10-CM | POA: Diagnosis not present

## 2022-05-26 DIAGNOSIS — N390 Urinary tract infection, site not specified: Secondary | ICD-10-CM | POA: Diagnosis not present

## 2022-05-26 DIAGNOSIS — M6281 Muscle weakness (generalized): Secondary | ICD-10-CM | POA: Diagnosis not present

## 2022-05-26 DIAGNOSIS — R2681 Unsteadiness on feet: Secondary | ICD-10-CM | POA: Diagnosis not present

## 2022-05-26 DIAGNOSIS — Z7984 Long term (current) use of oral hypoglycemic drugs: Secondary | ICD-10-CM | POA: Diagnosis not present

## 2022-05-26 DIAGNOSIS — R350 Frequency of micturition: Secondary | ICD-10-CM | POA: Diagnosis not present

## 2022-05-29 DIAGNOSIS — M6281 Muscle weakness (generalized): Secondary | ICD-10-CM | POA: Diagnosis not present

## 2022-05-29 DIAGNOSIS — R2681 Unsteadiness on feet: Secondary | ICD-10-CM | POA: Diagnosis not present

## 2022-05-29 DIAGNOSIS — E119 Type 2 diabetes mellitus without complications: Secondary | ICD-10-CM | POA: Diagnosis not present

## 2022-05-30 DIAGNOSIS — F4321 Adjustment disorder with depressed mood: Secondary | ICD-10-CM | POA: Diagnosis not present

## 2022-05-30 DIAGNOSIS — F5101 Primary insomnia: Secondary | ICD-10-CM | POA: Diagnosis not present

## 2022-05-30 DIAGNOSIS — R2681 Unsteadiness on feet: Secondary | ICD-10-CM | POA: Diagnosis not present

## 2022-05-30 DIAGNOSIS — E119 Type 2 diabetes mellitus without complications: Secondary | ICD-10-CM | POA: Diagnosis not present

## 2022-05-30 DIAGNOSIS — M6281 Muscle weakness (generalized): Secondary | ICD-10-CM | POA: Diagnosis not present

## 2022-05-31 DIAGNOSIS — M6281 Muscle weakness (generalized): Secondary | ICD-10-CM | POA: Diagnosis not present

## 2022-05-31 DIAGNOSIS — R293 Abnormal posture: Secondary | ICD-10-CM | POA: Diagnosis not present

## 2022-05-31 DIAGNOSIS — E119 Type 2 diabetes mellitus without complications: Secondary | ICD-10-CM | POA: Diagnosis not present

## 2022-05-31 DIAGNOSIS — R2681 Unsteadiness on feet: Secondary | ICD-10-CM | POA: Diagnosis not present

## 2022-06-01 DIAGNOSIS — R293 Abnormal posture: Secondary | ICD-10-CM | POA: Diagnosis not present

## 2022-06-01 DIAGNOSIS — E119 Type 2 diabetes mellitus without complications: Secondary | ICD-10-CM | POA: Diagnosis not present

## 2022-06-01 DIAGNOSIS — M6281 Muscle weakness (generalized): Secondary | ICD-10-CM | POA: Diagnosis not present

## 2022-06-01 DIAGNOSIS — R2681 Unsteadiness on feet: Secondary | ICD-10-CM | POA: Diagnosis not present

## 2022-06-02 DIAGNOSIS — R2681 Unsteadiness on feet: Secondary | ICD-10-CM | POA: Diagnosis not present

## 2022-06-02 DIAGNOSIS — Z6837 Body mass index (BMI) 37.0-37.9, adult: Secondary | ICD-10-CM | POA: Diagnosis not present

## 2022-06-02 DIAGNOSIS — B372 Candidiasis of skin and nail: Secondary | ICD-10-CM | POA: Diagnosis not present

## 2022-06-02 DIAGNOSIS — M6281 Muscle weakness (generalized): Secondary | ICD-10-CM | POA: Diagnosis not present

## 2022-06-02 DIAGNOSIS — E669 Obesity, unspecified: Secondary | ICD-10-CM | POA: Diagnosis not present

## 2022-06-02 DIAGNOSIS — Z7984 Long term (current) use of oral hypoglycemic drugs: Secondary | ICD-10-CM | POA: Diagnosis not present

## 2022-06-02 DIAGNOSIS — R293 Abnormal posture: Secondary | ICD-10-CM | POA: Diagnosis not present

## 2022-06-02 DIAGNOSIS — E119 Type 2 diabetes mellitus without complications: Secondary | ICD-10-CM | POA: Diagnosis not present

## 2022-06-06 DIAGNOSIS — I152 Hypertension secondary to endocrine disorders: Secondary | ICD-10-CM | POA: Diagnosis not present

## 2022-06-06 DIAGNOSIS — E669 Obesity, unspecified: Secondary | ICD-10-CM | POA: Diagnosis not present

## 2022-06-06 DIAGNOSIS — F4321 Adjustment disorder with depressed mood: Secondary | ICD-10-CM | POA: Diagnosis not present

## 2022-06-06 DIAGNOSIS — Z6837 Body mass index (BMI) 37.0-37.9, adult: Secondary | ICD-10-CM | POA: Diagnosis not present

## 2022-06-06 DIAGNOSIS — I872 Venous insufficiency (chronic) (peripheral): Secondary | ICD-10-CM | POA: Diagnosis not present

## 2022-06-12 DIAGNOSIS — Z7189 Other specified counseling: Secondary | ICD-10-CM | POA: Diagnosis not present

## 2022-06-12 DIAGNOSIS — H1013 Acute atopic conjunctivitis, bilateral: Secondary | ICD-10-CM | POA: Diagnosis not present

## 2022-06-12 DIAGNOSIS — J329 Chronic sinusitis, unspecified: Secondary | ICD-10-CM | POA: Diagnosis not present

## 2022-06-12 DIAGNOSIS — H6123 Impacted cerumen, bilateral: Secondary | ICD-10-CM | POA: Diagnosis not present

## 2022-06-20 DIAGNOSIS — F5101 Primary insomnia: Secondary | ICD-10-CM | POA: Diagnosis not present

## 2022-06-20 DIAGNOSIS — F4321 Adjustment disorder with depressed mood: Secondary | ICD-10-CM | POA: Diagnosis not present

## 2022-06-27 DIAGNOSIS — R2681 Unsteadiness on feet: Secondary | ICD-10-CM | POA: Diagnosis not present

## 2022-06-27 DIAGNOSIS — F4321 Adjustment disorder with depressed mood: Secondary | ICD-10-CM | POA: Diagnosis not present

## 2022-06-27 DIAGNOSIS — E119 Type 2 diabetes mellitus without complications: Secondary | ICD-10-CM | POA: Diagnosis not present

## 2022-06-27 DIAGNOSIS — M6281 Muscle weakness (generalized): Secondary | ICD-10-CM | POA: Diagnosis not present

## 2022-06-27 DIAGNOSIS — R293 Abnormal posture: Secondary | ICD-10-CM | POA: Diagnosis not present

## 2022-06-27 DIAGNOSIS — F5101 Primary insomnia: Secondary | ICD-10-CM | POA: Diagnosis not present

## 2022-06-28 DIAGNOSIS — M6281 Muscle weakness (generalized): Secondary | ICD-10-CM | POA: Diagnosis not present

## 2022-06-28 DIAGNOSIS — E119 Type 2 diabetes mellitus without complications: Secondary | ICD-10-CM | POA: Diagnosis not present

## 2022-06-28 DIAGNOSIS — R293 Abnormal posture: Secondary | ICD-10-CM | POA: Diagnosis not present

## 2022-06-28 DIAGNOSIS — R2681 Unsteadiness on feet: Secondary | ICD-10-CM | POA: Diagnosis not present

## 2022-06-29 DIAGNOSIS — F4320 Adjustment disorder, unspecified: Secondary | ICD-10-CM | POA: Diagnosis not present

## 2022-06-29 DIAGNOSIS — H6990 Unspecified Eustachian tube disorder, unspecified ear: Secondary | ICD-10-CM | POA: Diagnosis not present

## 2022-06-29 DIAGNOSIS — J329 Chronic sinusitis, unspecified: Secondary | ICD-10-CM | POA: Diagnosis not present

## 2022-06-29 DIAGNOSIS — I10 Essential (primary) hypertension: Secondary | ICD-10-CM | POA: Diagnosis not present

## 2022-06-29 DIAGNOSIS — M6281 Muscle weakness (generalized): Secondary | ICD-10-CM | POA: Diagnosis not present

## 2022-06-29 DIAGNOSIS — E119 Type 2 diabetes mellitus without complications: Secondary | ICD-10-CM | POA: Diagnosis not present

## 2022-06-29 DIAGNOSIS — R2681 Unsteadiness on feet: Secondary | ICD-10-CM | POA: Diagnosis not present

## 2022-06-29 DIAGNOSIS — R293 Abnormal posture: Secondary | ICD-10-CM | POA: Diagnosis not present

## 2022-06-30 DIAGNOSIS — R293 Abnormal posture: Secondary | ICD-10-CM | POA: Diagnosis not present

## 2022-06-30 DIAGNOSIS — M6281 Muscle weakness (generalized): Secondary | ICD-10-CM | POA: Diagnosis not present

## 2022-06-30 DIAGNOSIS — E119 Type 2 diabetes mellitus without complications: Secondary | ICD-10-CM | POA: Diagnosis not present

## 2022-07-03 DIAGNOSIS — M6281 Muscle weakness (generalized): Secondary | ICD-10-CM | POA: Diagnosis not present

## 2022-07-03 DIAGNOSIS — E119 Type 2 diabetes mellitus without complications: Secondary | ICD-10-CM | POA: Diagnosis not present

## 2022-07-03 DIAGNOSIS — R293 Abnormal posture: Secondary | ICD-10-CM | POA: Diagnosis not present

## 2022-07-04 DIAGNOSIS — M6281 Muscle weakness (generalized): Secondary | ICD-10-CM | POA: Diagnosis not present

## 2022-07-04 DIAGNOSIS — R293 Abnormal posture: Secondary | ICD-10-CM | POA: Diagnosis not present

## 2022-07-04 DIAGNOSIS — E119 Type 2 diabetes mellitus without complications: Secondary | ICD-10-CM | POA: Diagnosis not present

## 2022-07-04 DIAGNOSIS — F4321 Adjustment disorder with depressed mood: Secondary | ICD-10-CM | POA: Diagnosis not present

## 2022-07-05 DIAGNOSIS — E119 Type 2 diabetes mellitus without complications: Secondary | ICD-10-CM | POA: Diagnosis not present

## 2022-07-05 DIAGNOSIS — R2681 Unsteadiness on feet: Secondary | ICD-10-CM | POA: Diagnosis not present

## 2022-07-05 DIAGNOSIS — R5381 Other malaise: Secondary | ICD-10-CM | POA: Diagnosis not present

## 2022-07-05 DIAGNOSIS — M6250 Muscle wasting and atrophy, not elsewhere classified, unspecified site: Secondary | ICD-10-CM | POA: Diagnosis not present

## 2022-07-05 DIAGNOSIS — Z8616 Personal history of COVID-19: Secondary | ICD-10-CM | POA: Diagnosis not present

## 2022-07-05 DIAGNOSIS — M6281 Muscle weakness (generalized): Secondary | ICD-10-CM | POA: Diagnosis not present

## 2022-07-05 DIAGNOSIS — R293 Abnormal posture: Secondary | ICD-10-CM | POA: Diagnosis not present

## 2022-07-06 DIAGNOSIS — M6281 Muscle weakness (generalized): Secondary | ICD-10-CM | POA: Diagnosis not present

## 2022-07-06 DIAGNOSIS — R293 Abnormal posture: Secondary | ICD-10-CM | POA: Diagnosis not present

## 2022-07-06 DIAGNOSIS — E119 Type 2 diabetes mellitus without complications: Secondary | ICD-10-CM | POA: Diagnosis not present

## 2022-07-07 DIAGNOSIS — E119 Type 2 diabetes mellitus without complications: Secondary | ICD-10-CM | POA: Diagnosis not present

## 2022-07-07 DIAGNOSIS — R293 Abnormal posture: Secondary | ICD-10-CM | POA: Diagnosis not present

## 2022-07-07 DIAGNOSIS — M6281 Muscle weakness (generalized): Secondary | ICD-10-CM | POA: Diagnosis not present

## 2022-07-10 DIAGNOSIS — R2681 Unsteadiness on feet: Secondary | ICD-10-CM | POA: Diagnosis not present

## 2022-07-10 DIAGNOSIS — Z8616 Personal history of COVID-19: Secondary | ICD-10-CM | POA: Diagnosis not present

## 2022-07-10 DIAGNOSIS — R5381 Other malaise: Secondary | ICD-10-CM | POA: Diagnosis not present

## 2022-07-10 DIAGNOSIS — M6281 Muscle weakness (generalized): Secondary | ICD-10-CM | POA: Diagnosis not present

## 2022-07-10 DIAGNOSIS — R293 Abnormal posture: Secondary | ICD-10-CM | POA: Diagnosis not present

## 2022-07-10 DIAGNOSIS — E119 Type 2 diabetes mellitus without complications: Secondary | ICD-10-CM | POA: Diagnosis not present

## 2022-07-10 DIAGNOSIS — M6250 Muscle wasting and atrophy, not elsewhere classified, unspecified site: Secondary | ICD-10-CM | POA: Diagnosis not present

## 2022-07-11 DIAGNOSIS — E119 Type 2 diabetes mellitus without complications: Secondary | ICD-10-CM | POA: Diagnosis not present

## 2022-07-11 DIAGNOSIS — Z6836 Body mass index (BMI) 36.0-36.9, adult: Secondary | ICD-10-CM | POA: Diagnosis not present

## 2022-07-11 DIAGNOSIS — F4321 Adjustment disorder with depressed mood: Secondary | ICD-10-CM | POA: Diagnosis not present

## 2022-07-11 DIAGNOSIS — I10 Essential (primary) hypertension: Secondary | ICD-10-CM | POA: Diagnosis not present

## 2022-07-11 DIAGNOSIS — F4320 Adjustment disorder, unspecified: Secondary | ICD-10-CM | POA: Diagnosis not present

## 2022-07-11 DIAGNOSIS — R293 Abnormal posture: Secondary | ICD-10-CM | POA: Diagnosis not present

## 2022-07-11 DIAGNOSIS — D6489 Other specified anemias: Secondary | ICD-10-CM | POA: Diagnosis not present

## 2022-07-11 DIAGNOSIS — I69319 Unspecified symptoms and signs involving cognitive functions following cerebral infarction: Secondary | ICD-10-CM | POA: Diagnosis not present

## 2022-07-11 DIAGNOSIS — E669 Obesity, unspecified: Secondary | ICD-10-CM | POA: Diagnosis not present

## 2022-07-11 DIAGNOSIS — F40232 Fear of other medical care: Secondary | ICD-10-CM | POA: Diagnosis not present

## 2022-07-11 DIAGNOSIS — M6281 Muscle weakness (generalized): Secondary | ICD-10-CM | POA: Diagnosis not present

## 2022-07-11 DIAGNOSIS — M25562 Pain in left knee: Secondary | ICD-10-CM | POA: Diagnosis not present

## 2022-07-12 DIAGNOSIS — H6123 Impacted cerumen, bilateral: Secondary | ICD-10-CM | POA: Diagnosis not present

## 2022-07-12 DIAGNOSIS — I4891 Unspecified atrial fibrillation: Secondary | ICD-10-CM | POA: Diagnosis not present

## 2022-07-12 DIAGNOSIS — H1013 Acute atopic conjunctivitis, bilateral: Secondary | ICD-10-CM | POA: Diagnosis not present

## 2022-07-12 DIAGNOSIS — I10 Essential (primary) hypertension: Secondary | ICD-10-CM | POA: Diagnosis not present

## 2022-07-12 DIAGNOSIS — B372 Candidiasis of skin and nail: Secondary | ICD-10-CM | POA: Diagnosis not present

## 2022-07-12 DIAGNOSIS — E1159 Type 2 diabetes mellitus with other circulatory complications: Secondary | ICD-10-CM | POA: Diagnosis not present

## 2022-07-12 DIAGNOSIS — D638 Anemia in other chronic diseases classified elsewhere: Secondary | ICD-10-CM | POA: Diagnosis not present

## 2022-07-12 DIAGNOSIS — E119 Type 2 diabetes mellitus without complications: Secondary | ICD-10-CM | POA: Diagnosis not present

## 2022-07-12 DIAGNOSIS — Z7984 Long term (current) use of oral hypoglycemic drugs: Secondary | ICD-10-CM | POA: Diagnosis not present

## 2022-07-12 DIAGNOSIS — R293 Abnormal posture: Secondary | ICD-10-CM | POA: Diagnosis not present

## 2022-07-12 DIAGNOSIS — M6281 Muscle weakness (generalized): Secondary | ICD-10-CM | POA: Diagnosis not present

## 2022-07-14 DIAGNOSIS — R293 Abnormal posture: Secondary | ICD-10-CM | POA: Diagnosis not present

## 2022-07-14 DIAGNOSIS — E119 Type 2 diabetes mellitus without complications: Secondary | ICD-10-CM | POA: Diagnosis not present

## 2022-07-14 DIAGNOSIS — M6281 Muscle weakness (generalized): Secondary | ICD-10-CM | POA: Diagnosis not present

## 2022-07-17 DIAGNOSIS — E119 Type 2 diabetes mellitus without complications: Secondary | ICD-10-CM | POA: Diagnosis not present

## 2022-07-17 DIAGNOSIS — R293 Abnormal posture: Secondary | ICD-10-CM | POA: Diagnosis not present

## 2022-07-17 DIAGNOSIS — M6281 Muscle weakness (generalized): Secondary | ICD-10-CM | POA: Diagnosis not present

## 2022-07-18 DIAGNOSIS — F4321 Adjustment disorder with depressed mood: Secondary | ICD-10-CM | POA: Diagnosis not present

## 2022-07-18 DIAGNOSIS — E1159 Type 2 diabetes mellitus with other circulatory complications: Secondary | ICD-10-CM | POA: Diagnosis not present

## 2022-07-18 DIAGNOSIS — R635 Abnormal weight gain: Secondary | ICD-10-CM | POA: Diagnosis not present

## 2022-07-18 DIAGNOSIS — E119 Type 2 diabetes mellitus without complications: Secondary | ICD-10-CM | POA: Diagnosis not present

## 2022-07-18 DIAGNOSIS — M6281 Muscle weakness (generalized): Secondary | ICD-10-CM | POA: Diagnosis not present

## 2022-07-18 DIAGNOSIS — R293 Abnormal posture: Secondary | ICD-10-CM | POA: Diagnosis not present

## 2022-07-18 DIAGNOSIS — I872 Venous insufficiency (chronic) (peripheral): Secondary | ICD-10-CM | POA: Diagnosis not present

## 2022-07-18 DIAGNOSIS — I152 Hypertension secondary to endocrine disorders: Secondary | ICD-10-CM | POA: Diagnosis not present

## 2022-07-19 DIAGNOSIS — E119 Type 2 diabetes mellitus without complications: Secondary | ICD-10-CM | POA: Diagnosis not present

## 2022-07-19 DIAGNOSIS — R293 Abnormal posture: Secondary | ICD-10-CM | POA: Diagnosis not present

## 2022-07-19 DIAGNOSIS — M6281 Muscle weakness (generalized): Secondary | ICD-10-CM | POA: Diagnosis not present

## 2022-07-20 DIAGNOSIS — M6281 Muscle weakness (generalized): Secondary | ICD-10-CM | POA: Diagnosis not present

## 2022-07-20 DIAGNOSIS — E119 Type 2 diabetes mellitus without complications: Secondary | ICD-10-CM | POA: Diagnosis not present

## 2022-07-20 DIAGNOSIS — R293 Abnormal posture: Secondary | ICD-10-CM | POA: Diagnosis not present

## 2022-07-21 DIAGNOSIS — M6281 Muscle weakness (generalized): Secondary | ICD-10-CM | POA: Diagnosis not present

## 2022-07-21 DIAGNOSIS — R293 Abnormal posture: Secondary | ICD-10-CM | POA: Diagnosis not present

## 2022-07-21 DIAGNOSIS — E119 Type 2 diabetes mellitus without complications: Secondary | ICD-10-CM | POA: Diagnosis not present

## 2022-07-26 DIAGNOSIS — E119 Type 2 diabetes mellitus without complications: Secondary | ICD-10-CM | POA: Diagnosis not present

## 2022-07-26 DIAGNOSIS — M6281 Muscle weakness (generalized): Secondary | ICD-10-CM | POA: Diagnosis not present

## 2022-07-26 DIAGNOSIS — R293 Abnormal posture: Secondary | ICD-10-CM | POA: Diagnosis not present

## 2022-07-27 DIAGNOSIS — R293 Abnormal posture: Secondary | ICD-10-CM | POA: Diagnosis not present

## 2022-07-27 DIAGNOSIS — E119 Type 2 diabetes mellitus without complications: Secondary | ICD-10-CM | POA: Diagnosis not present

## 2022-07-27 DIAGNOSIS — M6281 Muscle weakness (generalized): Secondary | ICD-10-CM | POA: Diagnosis not present

## 2022-07-28 DIAGNOSIS — R293 Abnormal posture: Secondary | ICD-10-CM | POA: Diagnosis not present

## 2022-07-28 DIAGNOSIS — E119 Type 2 diabetes mellitus without complications: Secondary | ICD-10-CM | POA: Diagnosis not present

## 2022-07-28 DIAGNOSIS — M6281 Muscle weakness (generalized): Secondary | ICD-10-CM | POA: Diagnosis not present

## 2022-08-01 DIAGNOSIS — R293 Abnormal posture: Secondary | ICD-10-CM | POA: Diagnosis not present

## 2022-08-01 DIAGNOSIS — M6281 Muscle weakness (generalized): Secondary | ICD-10-CM | POA: Diagnosis not present

## 2022-08-01 DIAGNOSIS — E119 Type 2 diabetes mellitus without complications: Secondary | ICD-10-CM | POA: Diagnosis not present

## 2022-08-02 DIAGNOSIS — E119 Type 2 diabetes mellitus without complications: Secondary | ICD-10-CM | POA: Diagnosis not present

## 2022-08-02 DIAGNOSIS — R293 Abnormal posture: Secondary | ICD-10-CM | POA: Diagnosis not present

## 2022-08-02 DIAGNOSIS — M6281 Muscle weakness (generalized): Secondary | ICD-10-CM | POA: Diagnosis not present

## 2022-08-03 DIAGNOSIS — M6281 Muscle weakness (generalized): Secondary | ICD-10-CM | POA: Diagnosis not present

## 2022-08-03 DIAGNOSIS — E119 Type 2 diabetes mellitus without complications: Secondary | ICD-10-CM | POA: Diagnosis not present

## 2022-08-03 DIAGNOSIS — R293 Abnormal posture: Secondary | ICD-10-CM | POA: Diagnosis not present

## 2022-08-04 DIAGNOSIS — R2681 Unsteadiness on feet: Secondary | ICD-10-CM | POA: Diagnosis not present

## 2022-08-04 DIAGNOSIS — E119 Type 2 diabetes mellitus without complications: Secondary | ICD-10-CM | POA: Diagnosis not present

## 2022-08-04 DIAGNOSIS — R5381 Other malaise: Secondary | ICD-10-CM | POA: Diagnosis not present

## 2022-08-04 DIAGNOSIS — M6281 Muscle weakness (generalized): Secondary | ICD-10-CM | POA: Diagnosis not present

## 2022-08-04 DIAGNOSIS — Z8616 Personal history of COVID-19: Secondary | ICD-10-CM | POA: Diagnosis not present

## 2022-08-04 DIAGNOSIS — M6250 Muscle wasting and atrophy, not elsewhere classified, unspecified site: Secondary | ICD-10-CM | POA: Diagnosis not present

## 2022-08-04 DIAGNOSIS — R293 Abnormal posture: Secondary | ICD-10-CM | POA: Diagnosis not present

## 2022-08-07 DIAGNOSIS — R2681 Unsteadiness on feet: Secondary | ICD-10-CM | POA: Diagnosis not present

## 2022-08-07 DIAGNOSIS — M6281 Muscle weakness (generalized): Secondary | ICD-10-CM | POA: Diagnosis not present

## 2022-08-07 DIAGNOSIS — R5381 Other malaise: Secondary | ICD-10-CM | POA: Diagnosis not present

## 2022-08-07 DIAGNOSIS — E119 Type 2 diabetes mellitus without complications: Secondary | ICD-10-CM | POA: Diagnosis not present

## 2022-08-07 DIAGNOSIS — Z8616 Personal history of COVID-19: Secondary | ICD-10-CM | POA: Diagnosis not present

## 2022-08-07 DIAGNOSIS — M6250 Muscle wasting and atrophy, not elsewhere classified, unspecified site: Secondary | ICD-10-CM | POA: Diagnosis not present

## 2022-08-07 DIAGNOSIS — R293 Abnormal posture: Secondary | ICD-10-CM | POA: Diagnosis not present

## 2022-08-08 DIAGNOSIS — M6281 Muscle weakness (generalized): Secondary | ICD-10-CM | POA: Diagnosis not present

## 2022-08-08 DIAGNOSIS — R293 Abnormal posture: Secondary | ICD-10-CM | POA: Diagnosis not present

## 2022-08-08 DIAGNOSIS — E119 Type 2 diabetes mellitus without complications: Secondary | ICD-10-CM | POA: Diagnosis not present

## 2022-08-09 DIAGNOSIS — E119 Type 2 diabetes mellitus without complications: Secondary | ICD-10-CM | POA: Diagnosis not present

## 2022-08-09 DIAGNOSIS — M6281 Muscle weakness (generalized): Secondary | ICD-10-CM | POA: Diagnosis not present

## 2022-08-09 DIAGNOSIS — R293 Abnormal posture: Secondary | ICD-10-CM | POA: Diagnosis not present

## 2022-08-10 DIAGNOSIS — E119 Type 2 diabetes mellitus without complications: Secondary | ICD-10-CM | POA: Diagnosis not present

## 2022-08-10 DIAGNOSIS — R293 Abnormal posture: Secondary | ICD-10-CM | POA: Diagnosis not present

## 2022-08-10 DIAGNOSIS — M6281 Muscle weakness (generalized): Secondary | ICD-10-CM | POA: Diagnosis not present

## 2022-08-11 DIAGNOSIS — E119 Type 2 diabetes mellitus without complications: Secondary | ICD-10-CM | POA: Diagnosis not present

## 2022-08-11 DIAGNOSIS — M6281 Muscle weakness (generalized): Secondary | ICD-10-CM | POA: Diagnosis not present

## 2022-08-11 DIAGNOSIS — R293 Abnormal posture: Secondary | ICD-10-CM | POA: Diagnosis not present

## 2022-08-11 DIAGNOSIS — F4321 Adjustment disorder with depressed mood: Secondary | ICD-10-CM | POA: Diagnosis not present

## 2022-08-14 DIAGNOSIS — E119 Type 2 diabetes mellitus without complications: Secondary | ICD-10-CM | POA: Diagnosis not present

## 2022-08-14 DIAGNOSIS — R293 Abnormal posture: Secondary | ICD-10-CM | POA: Diagnosis not present

## 2022-08-14 DIAGNOSIS — M6281 Muscle weakness (generalized): Secondary | ICD-10-CM | POA: Diagnosis not present

## 2022-08-14 DIAGNOSIS — Z1231 Encounter for screening mammogram for malignant neoplasm of breast: Secondary | ICD-10-CM | POA: Diagnosis not present

## 2022-08-15 DIAGNOSIS — M6281 Muscle weakness (generalized): Secondary | ICD-10-CM | POA: Diagnosis not present

## 2022-08-15 DIAGNOSIS — E119 Type 2 diabetes mellitus without complications: Secondary | ICD-10-CM | POA: Diagnosis not present

## 2022-08-15 DIAGNOSIS — R293 Abnormal posture: Secondary | ICD-10-CM | POA: Diagnosis not present

## 2022-08-16 ENCOUNTER — Emergency Department (HOSPITAL_COMMUNITY): Payer: Medicare PPO

## 2022-08-16 ENCOUNTER — Emergency Department (HOSPITAL_COMMUNITY)
Admission: EM | Admit: 2022-08-16 | Discharge: 2022-08-16 | Disposition: A | Discharge status: Skilled Nursing Facility | Payer: Medicare PPO | Attending: Emergency Medicine | Admitting: Emergency Medicine

## 2022-08-16 ENCOUNTER — Other Ambulatory Visit: Payer: Self-pay

## 2022-08-16 DIAGNOSIS — R509 Fever, unspecified: Secondary | ICD-10-CM | POA: Diagnosis not present

## 2022-08-16 DIAGNOSIS — Z79899 Other long term (current) drug therapy: Secondary | ICD-10-CM | POA: Insufficient documentation

## 2022-08-16 DIAGNOSIS — I10 Essential (primary) hypertension: Secondary | ICD-10-CM | POA: Insufficient documentation

## 2022-08-16 DIAGNOSIS — R7989 Other specified abnormal findings of blood chemistry: Secondary | ICD-10-CM | POA: Insufficient documentation

## 2022-08-16 DIAGNOSIS — Z20822 Contact with and (suspected) exposure to covid-19: Secondary | ICD-10-CM | POA: Diagnosis not present

## 2022-08-16 DIAGNOSIS — Z7901 Long term (current) use of anticoagulants: Secondary | ICD-10-CM | POA: Diagnosis not present

## 2022-08-16 DIAGNOSIS — E119 Type 2 diabetes mellitus without complications: Secondary | ICD-10-CM | POA: Diagnosis not present

## 2022-08-16 DIAGNOSIS — I959 Hypotension, unspecified: Secondary | ICD-10-CM | POA: Diagnosis not present

## 2022-08-16 DIAGNOSIS — Z7401 Bed confinement status: Secondary | ICD-10-CM | POA: Diagnosis not present

## 2022-08-16 DIAGNOSIS — R410 Disorientation, unspecified: Secondary | ICD-10-CM | POA: Diagnosis not present

## 2022-08-16 DIAGNOSIS — R059 Cough, unspecified: Secondary | ICD-10-CM | POA: Diagnosis not present

## 2022-08-16 DIAGNOSIS — R293 Abnormal posture: Secondary | ICD-10-CM | POA: Diagnosis not present

## 2022-08-16 DIAGNOSIS — J101 Influenza due to other identified influenza virus with other respiratory manifestations: Secondary | ICD-10-CM | POA: Diagnosis not present

## 2022-08-16 DIAGNOSIS — M6281 Muscle weakness (generalized): Secondary | ICD-10-CM | POA: Diagnosis not present

## 2022-08-16 LAB — PROTIME-INR
INR: 1.3 — ABNORMAL HIGH (ref 0.8–1.2)
Prothrombin Time: 16 seconds — ABNORMAL HIGH (ref 11.4–15.2)

## 2022-08-16 LAB — COMPREHENSIVE METABOLIC PANEL
ALT: 88 U/L — ABNORMAL HIGH (ref 0–44)
AST: 115 U/L — ABNORMAL HIGH (ref 15–41)
Albumin: 3.2 g/dL — ABNORMAL LOW (ref 3.5–5.0)
Alkaline Phosphatase: 88 U/L (ref 38–126)
Anion gap: 10 (ref 5–15)
BUN: 15 mg/dL (ref 8–23)
CO2: 24 mmol/L (ref 22–32)
Calcium: 8.5 mg/dL — ABNORMAL LOW (ref 8.9–10.3)
Chloride: 99 mmol/L (ref 98–111)
Creatinine, Ser: 0.95 mg/dL (ref 0.44–1.00)
GFR, Estimated: 59 mL/min — ABNORMAL LOW (ref >60)
Glucose, Bld: 115 mg/dL — ABNORMAL HIGH (ref 70–99)
Potassium: 4.3 mmol/L (ref 3.5–5.1)
Sodium: 133 mmol/L — ABNORMAL LOW (ref 135–145)
Total Bilirubin: 0.7 mg/dL (ref 0.3–1.2)
Total Protein: 7.1 g/dL (ref 6.5–8.1)

## 2022-08-16 LAB — CBC WITH DIFFERENTIAL/PLATELET
Abs Immature Granulocytes: 0.01 10*3/uL (ref 0.00–0.07)
Basophils Absolute: 0 10*3/uL (ref 0.0–0.1)
Basophils Relative: 1 %
Eosinophils Absolute: 0.1 10*3/uL (ref 0.0–0.5)
Eosinophils Relative: 1 %
HCT: 33.4 % — ABNORMAL LOW (ref 36.0–46.0)
Hemoglobin: 10.7 g/dL — ABNORMAL LOW (ref 12.0–15.0)
Immature Granulocytes: 0 %
Lymphocytes Relative: 12 %
Lymphs Abs: 0.7 10*3/uL (ref 0.7–4.0)
MCH: 27.2 pg (ref 26.0–34.0)
MCHC: 32 g/dL (ref 30.0–36.0)
MCV: 84.8 fL (ref 80.0–100.0)
Monocytes Absolute: 0.5 10*3/uL (ref 0.1–1.0)
Monocytes Relative: 8 %
Neutro Abs: 4.7 10*3/uL (ref 1.7–7.7)
Neutrophils Relative %: 78 %
Platelets: 171 10*3/uL (ref 150–400)
RBC: 3.94 MIL/uL (ref 3.87–5.11)
RDW: 14.1 % (ref 11.5–15.5)
WBC: 5.9 10*3/uL (ref 4.0–10.5)
nRBC: 0 % (ref 0.0–0.2)

## 2022-08-16 LAB — APTT: aPTT: 25 seconds (ref 24–36)

## 2022-08-16 LAB — RESP PANEL BY RT-PCR (RSV, FLU A&B, COVID)  RVPGX2
Influenza A by PCR: POSITIVE — AB
Influenza B by PCR: NEGATIVE
Resp Syncytial Virus by PCR: NEGATIVE
SARS Coronavirus 2 by RT PCR: NEGATIVE

## 2022-08-16 LAB — LACTIC ACID, PLASMA
Lactic Acid, Venous: 1.5 mmol/L (ref 0.5–1.9)
Lactic Acid, Venous: 1 mmol/L (ref 0.5–1.9)

## 2022-08-16 MED ORDER — ACETAMINOPHEN 325 MG PO TABS
650.0000 mg | ORAL_TABLET | Freq: Once | ORAL | Status: AC
  Administered 2022-08-16: 650 mg via ORAL
  Filled 2022-08-16: qty 2

## 2022-08-16 MED ORDER — ACETAMINOPHEN 325 MG PO TABS: 325.0000 mg | ORAL_TABLET | Freq: Once | ORAL | Status: DC

## 2022-08-16 NOTE — ED Notes (Signed)
Family updated as to patient's status.

## 2022-08-16 NOTE — ED Notes (Signed)
Patient needs meds crushed in applesauce

## 2022-08-16 NOTE — ED Provider Notes (Signed)
[facility] EMERGENCY DEPARTMENT Provider Note   CSN: [id number] Arrival date & time: 08/16/22  1540     History  Chief Complaint  Patient presents with   Cough    Kayla [name] is a 82 y.o. female.  He has past medical history of numerous comorbidities including type II diabetes, hypertension, atrial fibrillation, GERD and anemia.  She is brought to the emergency department by EMS today from her nursing home.  They states that she started coughing yesterday and was confused when she woke up this morning.  Normally she is alert and oriented x 4.   Cough      Home Medications Prior to Admission medications   Medication Sig Start Date End Date Taking? Authorizing Provider  Accu-Chek Softclix Lancets lancets 1 each by Other route daily. Use as instructed 03/30/20   Denita Lung, MD  acetaminophen (TYLENOL) 325 MG tablet Take 650 mg by mouth every 8 (eight) hours as needed (pain).    [provider]  Amino Acids-Protein Hydrolys (PRO-STAT AWC) LIQD Take 30 mLs by mouth 2 (two) times daily.    [provider]  apixaban (ELIQUIS) 5 MG TABS tablet Take 5 mg by mouth 2 (two) times daily.    [provider]  ascorbic acid (VITAMIN C) 500 MG tablet Take 1 tablet (500 mg total) by mouth daily. Patient taking differently: Take 500 mg by mouth 2 (two) times daily. 08/30/21   Regalado, Belkys A, MD  atorvastatin (LIPITOR) 80 MG tablet Take 1 tablet (80 mg total) by mouth daily. Patient taking differently: Take 80 mg by mouth at bedtime. 01/20/20   Denita Lung, MD  carvedilol (COREG) 6.25 MG tablet Take 1 tablet (6.25 mg total) by mouth 2 (two) times daily. 03/31/21   Chandrasekhar, Terisa Starr, MD  cefdinir (OMNICEF) 300 MG capsule Take 1 capsule (300 mg total) by mouth every 12 (twelve) hours. 12/03/21   Nita Sells, MD  docusate sodium (COLACE) 100 MG capsule Take 100 mg by mouth every morning. Take with apply juice    [provider]  ezetimibe (ZETIA) 10 MG tablet Take 10 mg by mouth at bedtime.    [provider]  ferrous sulfate 325 (65 FE) MG tablet Take 325 mg by mouth 2 (two) times daily.    [provider]  gabapentin (NEURONTIN) 100 MG capsule Take 1 capsule (100 mg total) by mouth daily. 12/03/21   Nita Sells, MD  glucose blood test strip 1 each by Other route as needed for other. Use as instructed Patient taking differently: 1 each by Other route daily. Use as instructed 12/31/20   Denita Lung, MD  ipratropium-albuterol (DUONEB) 0.5-2.5 (3) MG/3ML SOLN Take 3 mLs by nebulization every 4 (four) hours as needed. Patient taking differently: Take 3 mLs by nebulization every 4 (four) hours as needed (wheezing/ shortness of breath). 08/29/21   Regalado, Belkys A, MD  lidocaine (LIDODERM) 5 % Place 1 patch onto the skin See admin instructions. Apply 1 patch transdermally to right shoulder and 1 patch to right elbow - every afternoon. Remove & Discard patch within 12 hours or as directed by MD    [provider]  lidocaine 4 % Place 2 patches onto the skin See admin instructions. Apply 2 patches transdermally to left leg every morning, remove nightly    [provider]  losartan (COZAAR) 25 MG tablet Take 25 mg by mouth every morning.    [provider]  melatonin 5 MG TABS Take 5 mg by mouth at bedtime as needed (sleep).    [provider]  Menthol, Topical Analgesic, (BIOFREEZE) 4 % GEL Apply 1 application. topically See admin instructions. Apply thin layer topically to right shoulder and arm three times daily for pain, may apply to other affected areas also    [provider]  nitroGLYCERIN (NITROSTAT) 0.4 MG SL tablet Take up to 3 tablets Patient taking differently: 0.4 mg every 5 (five) minutes as needed for chest pain (max 3 tablets). 03/31/21   Werner Lean, MD      Allergies    Patient has no known allergies.    Review of Systems    Review of Systems  Reason unable to perform ROS: AMS.  Respiratory:  Positive for cough.     Physical Exam Updated Vital Signs BP (!) 147/67   Pulse 94   Temp (!) 102.9 F (39.4 C) (Oral)   Resp (!) 35   Ht '5\' 2"'$  (1.575 m)   Wt 92 kg   SpO2 99%   BMI 37.10 kg/m  Physical Exam Vitals and nursing note reviewed.  Constitutional:      General: She is not in acute distress.    Appearance: She is well-developed.  HENT:     Head: Normocephalic and atraumatic.     Mouth/Throat:     Mouth: Mucous membranes are moist.  Eyes:     Conjunctiva/sclera: Conjunctivae normal.  Cardiovascular:     Rate and Rhythm: Normal rate and regular rhythm.     Heart sounds: No murmur heard. Pulmonary:     Effort: Pulmonary effort is normal. No respiratory distress.     Breath sounds: Normal breath sounds.  Abdominal:     Palpations: Abdomen is soft.     Tenderness: There is no abdominal tenderness.  Musculoskeletal:        General: No swelling.     Cervical back: Normal range of motion and neck supple.  Skin:    General: Skin is warm and dry.     Capillary Refill: Capillary refill takes less than 2 seconds.  Neurological:     Mental Status: She is alert.  Psychiatric:        Mood and Affect: Mood normal.     ED Results / Procedures / Treatments   Labs (all labs ordered are listed, but only abnormal results are displayed) Labs Reviewed  COMPREHENSIVE METABOLIC PANEL - Abnormal; Notable for the following components:      Result Value   Sodium 133 (*)    Glucose, Bld 115 (*)    Calcium 8.5 (*)    Albumin 3.2 (*)    AST 115 (*)    ALT 88 (*)    GFR, Estimated 59 (*)    All other components within normal limits  CBC WITH DIFFERENTIAL/PLATELET - Abnormal; Notable for the following components:   Hemoglobin 10.7 (*)    HCT 33.4 (*)    All other components within normal limits  PROTIME-INR - Abnormal; Notable for the following components:   Prothrombin Time 16.0 (*)    INR 1.3 (*)     All other components within normal limits  CULTURE, BLOOD (ROUTINE X 2)  CULTURE, BLOOD (ROUTINE X 2)  URINE CULTURE  RESP PANEL BY RT-PCR (RSV, FLU A&B, COVID)  [id number]  LACTIC ACID, PLASMA  APTT  LACTIC ACID, PLASMA  URINALYSIS, ROUTINE W REFLEX MICROSCOPIC    EKG None  Radiology DG Chest Port 1  View  Result Date: 08/16/2022 CLINICAL DATA:  Questionable sepsis to evaluate for abnormality. Cough, congestion, and fever for a few days. Confusion. EXAM: PORTABLE CHEST 1 VIEW COMPARISON:  11/30/2021 FINDINGS: Shallow inspiration. Heart size and pulmonary vascularity are normal for technique. No airspace disease or consolidation in the lungs. No pleural effusions. No pneumothorax. Mediastinal contours appear intact. Calcification of the aorta. Degenerative changes in the spine and shoulders. IMPRESSION: Shallow inspiration.  No evidence of active pulmonary disease. Electronically Signed   By: Lucienne Capers M.D.   On: 08/16/2022 16:41    Procedures Procedures    Medications Ordered in ED Medications  acetaminophen (TYLENOL) tablet 650 mg (has no administration in time range)    ED Course/ Medical Decision Making/ A&P                             Medical Decision Making This patient presents to the ED for concern of confusion, cough and fever, this involves an extensive number of treatment options, and is a complaint that carries with it a high risk of complications and morbidity.  The differential diagnosis includes pneumonia, sepsis, influenza, COVID 52, other   Co morbidities that complicate the patient evaluation  Diabetes, hypertension, frequent UTIs   Additional history obtained:  Additional history obtained from EMR External records from outside source obtained and reviewed including Discharge summary from last hospitalization, nursing home records reviewed   Lab Tests:  I Ordered, and personally interpreted labs.  The pertinent results include:  increased AST  and ALT compared to baseline  Positive influenza A   Imaging Studies ordered:  I ordered imaging studies including Chest Xray  I independently visualized and interpreted imaging which showed no pulmonary edema or infiltrate I agree with the radiologist interpretation   Cardiac Monitoring: / EKG:  The patient was maintained on a cardiac monitor.  I personally viewed and interpreted the cardiac monitored which showed an underlying rhythm of: sinus rhythm     Problem List / ED Course / Critical interventions / Medication management Pt was brought in to the ED  for evaluation for fever and cough with confusion, temperature noted to be 102.9. She is orient to self but is confused. She was noted to have a frequent cough.  Labs are reassuring, she did have mild elevated LFTs, and informed to follow-up with PCP for this for recheck.   Patient is positive for influenza A which explains her high fever.  Temperature is now 100.6 Fahrenheit on recheck when I reevaluated the patient.  She states she is feeling much better.  She was initially slightly confused but now is completely oriented and back to her baseline.  Discussed supportive care for symptoms and strict return precautions.  I ordered medication including Tylenol for fever Reevaluation of the patient after these medicines showed that the patient improved I have reviewed the patients home medicines and have made adjustments as needed   Social Determinants of Health:  Pt resides in a nursing home       Amount and/or Complexity of Data Reviewed Labs: ordered. Radiology: ordered. ECG/medicine tests: ordered.  Risk OTC drugs.           Final Clinical Impression(s) / ED Diagnoses Final diagnoses:  None    Rx / DC Orders ED Discharge Orders     None         Darci Current 08/16/22 1956    Blanchie Dessert, MD  08/16/22 2358  

## 2022-08-16 NOTE — ED Triage Notes (Signed)
Patient bib [facility] from [facility]. The facility reports that she has had a cough, congestion and fever for the past few days and today they noticed she was confused. [facility] reports she is Oriented to person  and place and her normal baseline is A&Ox4.

## 2022-08-16 NOTE — Discharge Instructions (Addendum)
You were diagnosed with Influenza "the flu" today. This causes high fevers, body aches and cough. Your chest Xray did not show pneumonia. Follow up with your PCP, come back to the ED for any new or worsening symptoms.   You can take tylenol as needed for the symptoms.  Also your liver enzymes were elevated somewhat today, and need to be rechecked by your PCP

## 2022-08-17 DIAGNOSIS — J101 Influenza due to other identified influenza virus with other respiratory manifestations: Secondary | ICD-10-CM | POA: Diagnosis not present

## 2022-08-17 DIAGNOSIS — I10 Essential (primary) hypertension: Secondary | ICD-10-CM | POA: Diagnosis not present

## 2022-08-17 DIAGNOSIS — D509 Iron deficiency anemia, unspecified: Secondary | ICD-10-CM | POA: Diagnosis not present

## 2022-08-21 DIAGNOSIS — M6281 Muscle weakness (generalized): Secondary | ICD-10-CM | POA: Diagnosis not present

## 2022-08-21 DIAGNOSIS — R293 Abnormal posture: Secondary | ICD-10-CM | POA: Diagnosis not present

## 2022-08-21 DIAGNOSIS — E119 Type 2 diabetes mellitus without complications: Secondary | ICD-10-CM | POA: Diagnosis not present

## 2022-08-21 LAB — CULTURE, BLOOD (ROUTINE X 2)
Culture: NO GROWTH
Culture: NO GROWTH
Special Requests: ADEQUATE

## 2022-08-22 DIAGNOSIS — N390 Urinary tract infection, site not specified: Secondary | ICD-10-CM | POA: Diagnosis not present

## 2022-08-22 DIAGNOSIS — Z789 Other specified health status: Secondary | ICD-10-CM | POA: Diagnosis not present

## 2022-08-22 DIAGNOSIS — F4321 Adjustment disorder with depressed mood: Secondary | ICD-10-CM | POA: Diagnosis not present

## 2022-08-22 DIAGNOSIS — Z7984 Long term (current) use of oral hypoglycemic drugs: Secondary | ICD-10-CM | POA: Diagnosis not present

## 2022-08-22 DIAGNOSIS — Z6841 Body Mass Index (BMI) 40.0 and over, adult: Secondary | ICD-10-CM | POA: Diagnosis not present

## 2022-08-22 DIAGNOSIS — E1169 Type 2 diabetes mellitus with other specified complication: Secondary | ICD-10-CM | POA: Diagnosis not present

## 2022-08-22 DIAGNOSIS — Z09 Encounter for follow-up examination after completed treatment for conditions other than malignant neoplasm: Secondary | ICD-10-CM | POA: Diagnosis not present

## 2022-08-22 DIAGNOSIS — I152 Hypertension secondary to endocrine disorders: Secondary | ICD-10-CM | POA: Diagnosis not present

## 2022-08-22 DIAGNOSIS — Z8619 Personal history of other infectious and parasitic diseases: Secondary | ICD-10-CM | POA: Diagnosis not present

## 2022-08-22 DIAGNOSIS — F5101 Primary insomnia: Secondary | ICD-10-CM | POA: Diagnosis not present

## 2022-08-22 DIAGNOSIS — E669 Obesity, unspecified: Secondary | ICD-10-CM | POA: Diagnosis not present

## 2022-08-23 DIAGNOSIS — E119 Type 2 diabetes mellitus without complications: Secondary | ICD-10-CM | POA: Diagnosis not present

## 2022-08-23 DIAGNOSIS — M6281 Muscle weakness (generalized): Secondary | ICD-10-CM | POA: Diagnosis not present

## 2022-08-23 DIAGNOSIS — R293 Abnormal posture: Secondary | ICD-10-CM | POA: Diagnosis not present

## 2022-08-25 DIAGNOSIS — R293 Abnormal posture: Secondary | ICD-10-CM | POA: Diagnosis not present

## 2022-08-25 DIAGNOSIS — E119 Type 2 diabetes mellitus without complications: Secondary | ICD-10-CM | POA: Diagnosis not present

## 2022-08-25 DIAGNOSIS — M6281 Muscle weakness (generalized): Secondary | ICD-10-CM | POA: Diagnosis not present

## 2022-08-26 DIAGNOSIS — E119 Type 2 diabetes mellitus without complications: Secondary | ICD-10-CM | POA: Diagnosis not present

## 2022-08-26 DIAGNOSIS — M6281 Muscle weakness (generalized): Secondary | ICD-10-CM | POA: Diagnosis not present

## 2022-08-26 DIAGNOSIS — R293 Abnormal posture: Secondary | ICD-10-CM | POA: Diagnosis not present

## 2022-08-28 DIAGNOSIS — R293 Abnormal posture: Secondary | ICD-10-CM | POA: Diagnosis not present

## 2022-08-28 DIAGNOSIS — M6281 Muscle weakness (generalized): Secondary | ICD-10-CM | POA: Diagnosis not present

## 2022-08-28 DIAGNOSIS — E119 Type 2 diabetes mellitus without complications: Secondary | ICD-10-CM | POA: Diagnosis not present

## 2022-08-30 DIAGNOSIS — E119 Type 2 diabetes mellitus without complications: Secondary | ICD-10-CM | POA: Diagnosis not present

## 2022-08-30 DIAGNOSIS — R293 Abnormal posture: Secondary | ICD-10-CM | POA: Diagnosis not present

## 2022-08-30 DIAGNOSIS — M6281 Muscle weakness (generalized): Secondary | ICD-10-CM | POA: Diagnosis not present

## 2022-08-31 DIAGNOSIS — M6281 Muscle weakness (generalized): Secondary | ICD-10-CM | POA: Diagnosis not present

## 2022-08-31 DIAGNOSIS — E119 Type 2 diabetes mellitus without complications: Secondary | ICD-10-CM | POA: Diagnosis not present

## 2022-08-31 DIAGNOSIS — R293 Abnormal posture: Secondary | ICD-10-CM | POA: Diagnosis not present

## 2022-09-01 DIAGNOSIS — Z6841 Body Mass Index (BMI) 40.0 and over, adult: Secondary | ICD-10-CM | POA: Diagnosis not present

## 2022-09-01 DIAGNOSIS — R293 Abnormal posture: Secondary | ICD-10-CM | POA: Diagnosis not present

## 2022-09-01 DIAGNOSIS — M6281 Muscle weakness (generalized): Secondary | ICD-10-CM | POA: Diagnosis not present

## 2022-09-01 DIAGNOSIS — M48061 Spinal stenosis, lumbar region without neurogenic claudication: Secondary | ICD-10-CM | POA: Diagnosis not present

## 2022-09-01 DIAGNOSIS — R635 Abnormal weight gain: Secondary | ICD-10-CM | POA: Diagnosis not present

## 2022-09-01 DIAGNOSIS — E1141 Type 2 diabetes mellitus with diabetic mononeuropathy: Secondary | ICD-10-CM | POA: Diagnosis not present

## 2022-09-01 DIAGNOSIS — I10 Essential (primary) hypertension: Secondary | ICD-10-CM | POA: Diagnosis not present

## 2022-09-01 DIAGNOSIS — E119 Type 2 diabetes mellitus without complications: Secondary | ICD-10-CM | POA: Diagnosis not present

## 2022-09-01 DIAGNOSIS — Z8673 Personal history of transient ischemic attack (TIA), and cerebral infarction without residual deficits: Secondary | ICD-10-CM | POA: Diagnosis not present

## 2022-09-01 DIAGNOSIS — R053 Chronic cough: Secondary | ICD-10-CM | POA: Diagnosis not present

## 2022-09-01 DIAGNOSIS — I4891 Unspecified atrial fibrillation: Secondary | ICD-10-CM | POA: Diagnosis not present

## 2022-09-04 DIAGNOSIS — E119 Type 2 diabetes mellitus without complications: Secondary | ICD-10-CM | POA: Diagnosis not present

## 2022-09-04 DIAGNOSIS — R293 Abnormal posture: Secondary | ICD-10-CM | POA: Diagnosis not present

## 2022-09-04 DIAGNOSIS — M6281 Muscle weakness (generalized): Secondary | ICD-10-CM | POA: Diagnosis not present

## 2022-09-05 DIAGNOSIS — R293 Abnormal posture: Secondary | ICD-10-CM | POA: Diagnosis not present

## 2022-09-05 DIAGNOSIS — M6281 Muscle weakness (generalized): Secondary | ICD-10-CM | POA: Diagnosis not present

## 2022-09-05 DIAGNOSIS — E119 Type 2 diabetes mellitus without complications: Secondary | ICD-10-CM | POA: Diagnosis not present

## 2022-09-06 DIAGNOSIS — M6281 Muscle weakness (generalized): Secondary | ICD-10-CM | POA: Diagnosis not present

## 2022-09-06 DIAGNOSIS — E119 Type 2 diabetes mellitus without complications: Secondary | ICD-10-CM | POA: Diagnosis not present

## 2022-09-06 DIAGNOSIS — R293 Abnormal posture: Secondary | ICD-10-CM | POA: Diagnosis not present

## 2022-09-08 DIAGNOSIS — M6281 Muscle weakness (generalized): Secondary | ICD-10-CM | POA: Diagnosis not present

## 2022-09-08 DIAGNOSIS — E119 Type 2 diabetes mellitus without complications: Secondary | ICD-10-CM | POA: Diagnosis not present

## 2022-09-08 DIAGNOSIS — R293 Abnormal posture: Secondary | ICD-10-CM | POA: Diagnosis not present

## 2022-09-11 DIAGNOSIS — F5101 Primary insomnia: Secondary | ICD-10-CM | POA: Diagnosis not present

## 2022-09-11 DIAGNOSIS — E119 Type 2 diabetes mellitus without complications: Secondary | ICD-10-CM | POA: Diagnosis not present

## 2022-09-11 DIAGNOSIS — R293 Abnormal posture: Secondary | ICD-10-CM | POA: Diagnosis not present

## 2022-09-11 DIAGNOSIS — M6281 Muscle weakness (generalized): Secondary | ICD-10-CM | POA: Diagnosis not present

## 2022-09-12 DIAGNOSIS — R293 Abnormal posture: Secondary | ICD-10-CM | POA: Diagnosis not present

## 2022-09-12 DIAGNOSIS — M6281 Muscle weakness (generalized): Secondary | ICD-10-CM | POA: Diagnosis not present

## 2022-09-12 DIAGNOSIS — E119 Type 2 diabetes mellitus without complications: Secondary | ICD-10-CM | POA: Diagnosis not present

## 2022-09-12 DIAGNOSIS — F4321 Adjustment disorder with depressed mood: Secondary | ICD-10-CM | POA: Diagnosis not present

## 2022-09-13 DIAGNOSIS — E119 Type 2 diabetes mellitus without complications: Secondary | ICD-10-CM | POA: Diagnosis not present

## 2022-09-13 DIAGNOSIS — R293 Abnormal posture: Secondary | ICD-10-CM | POA: Diagnosis not present

## 2022-09-13 DIAGNOSIS — M6281 Muscle weakness (generalized): Secondary | ICD-10-CM | POA: Diagnosis not present

## 2022-09-15 DIAGNOSIS — E119 Type 2 diabetes mellitus without complications: Secondary | ICD-10-CM | POA: Diagnosis not present

## 2022-09-15 DIAGNOSIS — R293 Abnormal posture: Secondary | ICD-10-CM | POA: Diagnosis not present

## 2022-09-15 DIAGNOSIS — M6281 Muscle weakness (generalized): Secondary | ICD-10-CM | POA: Diagnosis not present

## 2022-09-15 DIAGNOSIS — I509 Heart failure, unspecified: Secondary | ICD-10-CM | POA: Diagnosis not present

## 2022-09-18 DIAGNOSIS — R293 Abnormal posture: Secondary | ICD-10-CM | POA: Diagnosis not present

## 2022-09-18 DIAGNOSIS — M6281 Muscle weakness (generalized): Secondary | ICD-10-CM | POA: Diagnosis not present

## 2022-09-18 DIAGNOSIS — E119 Type 2 diabetes mellitus without complications: Secondary | ICD-10-CM | POA: Diagnosis not present

## 2022-09-19 DIAGNOSIS — R293 Abnormal posture: Secondary | ICD-10-CM | POA: Diagnosis not present

## 2022-09-19 DIAGNOSIS — M6281 Muscle weakness (generalized): Secondary | ICD-10-CM | POA: Diagnosis not present

## 2022-09-19 DIAGNOSIS — E119 Type 2 diabetes mellitus without complications: Secondary | ICD-10-CM | POA: Diagnosis not present

## 2022-09-20 DIAGNOSIS — F4321 Adjustment disorder with depressed mood: Secondary | ICD-10-CM | POA: Diagnosis not present

## 2022-09-20 DIAGNOSIS — M6281 Muscle weakness (generalized): Secondary | ICD-10-CM | POA: Diagnosis not present

## 2022-09-20 DIAGNOSIS — E119 Type 2 diabetes mellitus without complications: Secondary | ICD-10-CM | POA: Diagnosis not present

## 2022-09-20 DIAGNOSIS — R293 Abnormal posture: Secondary | ICD-10-CM | POA: Diagnosis not present

## 2022-09-22 DIAGNOSIS — R293 Abnormal posture: Secondary | ICD-10-CM | POA: Diagnosis not present

## 2022-09-22 DIAGNOSIS — M6281 Muscle weakness (generalized): Secondary | ICD-10-CM | POA: Diagnosis not present

## 2022-09-22 DIAGNOSIS — E119 Type 2 diabetes mellitus without complications: Secondary | ICD-10-CM | POA: Diagnosis not present

## 2022-09-25 DIAGNOSIS — R293 Abnormal posture: Secondary | ICD-10-CM | POA: Diagnosis not present

## 2022-09-25 DIAGNOSIS — M6281 Muscle weakness (generalized): Secondary | ICD-10-CM | POA: Diagnosis not present

## 2022-09-25 DIAGNOSIS — E119 Type 2 diabetes mellitus without complications: Secondary | ICD-10-CM | POA: Diagnosis not present

## 2022-09-26 DIAGNOSIS — M79672 Pain in left foot: Secondary | ICD-10-CM | POA: Diagnosis not present

## 2022-09-26 DIAGNOSIS — Z7409 Other reduced mobility: Secondary | ICD-10-CM | POA: Diagnosis not present

## 2022-09-26 DIAGNOSIS — I10 Essential (primary) hypertension: Secondary | ICD-10-CM | POA: Diagnosis not present

## 2022-09-27 DIAGNOSIS — E119 Type 2 diabetes mellitus without complications: Secondary | ICD-10-CM | POA: Diagnosis not present

## 2022-09-27 DIAGNOSIS — R293 Abnormal posture: Secondary | ICD-10-CM | POA: Diagnosis not present

## 2022-09-27 DIAGNOSIS — M6281 Muscle weakness (generalized): Secondary | ICD-10-CM | POA: Diagnosis not present

## 2022-09-28 DIAGNOSIS — E119 Type 2 diabetes mellitus without complications: Secondary | ICD-10-CM | POA: Diagnosis not present

## 2022-09-28 DIAGNOSIS — R293 Abnormal posture: Secondary | ICD-10-CM | POA: Diagnosis not present

## 2022-09-28 DIAGNOSIS — M6281 Muscle weakness (generalized): Secondary | ICD-10-CM | POA: Diagnosis not present

## 2022-09-29 DIAGNOSIS — M6281 Muscle weakness (generalized): Secondary | ICD-10-CM | POA: Diagnosis not present

## 2022-09-29 DIAGNOSIS — F5101 Primary insomnia: Secondary | ICD-10-CM | POA: Diagnosis not present

## 2022-09-29 DIAGNOSIS — F4321 Adjustment disorder with depressed mood: Secondary | ICD-10-CM | POA: Diagnosis not present

## 2022-09-29 DIAGNOSIS — E119 Type 2 diabetes mellitus without complications: Secondary | ICD-10-CM | POA: Diagnosis not present

## 2022-09-29 DIAGNOSIS — R293 Abnormal posture: Secondary | ICD-10-CM | POA: Diagnosis not present

## 2022-10-03 DIAGNOSIS — M6281 Muscle weakness (generalized): Secondary | ICD-10-CM | POA: Diagnosis not present

## 2022-10-03 DIAGNOSIS — R293 Abnormal posture: Secondary | ICD-10-CM | POA: Diagnosis not present

## 2022-10-03 DIAGNOSIS — E119 Type 2 diabetes mellitus without complications: Secondary | ICD-10-CM | POA: Diagnosis not present

## 2022-10-03 DIAGNOSIS — F4321 Adjustment disorder with depressed mood: Secondary | ICD-10-CM | POA: Diagnosis not present

## 2022-10-04 DIAGNOSIS — M6281 Muscle weakness (generalized): Secondary | ICD-10-CM | POA: Diagnosis not present

## 2022-10-04 DIAGNOSIS — R293 Abnormal posture: Secondary | ICD-10-CM | POA: Diagnosis not present

## 2022-10-04 DIAGNOSIS — E119 Type 2 diabetes mellitus without complications: Secondary | ICD-10-CM | POA: Diagnosis not present

## 2022-10-05 DIAGNOSIS — R293 Abnormal posture: Secondary | ICD-10-CM | POA: Diagnosis not present

## 2022-10-05 DIAGNOSIS — M6281 Muscle weakness (generalized): Secondary | ICD-10-CM | POA: Diagnosis not present

## 2022-10-05 DIAGNOSIS — E119 Type 2 diabetes mellitus without complications: Secondary | ICD-10-CM | POA: Diagnosis not present

## 2022-10-09 DIAGNOSIS — E119 Type 2 diabetes mellitus without complications: Secondary | ICD-10-CM | POA: Diagnosis not present

## 2022-10-09 DIAGNOSIS — M25572 Pain in left ankle and joints of left foot: Secondary | ICD-10-CM | POA: Diagnosis not present

## 2022-10-09 DIAGNOSIS — R293 Abnormal posture: Secondary | ICD-10-CM | POA: Diagnosis not present

## 2022-10-09 DIAGNOSIS — M6281 Muscle weakness (generalized): Secondary | ICD-10-CM | POA: Diagnosis not present

## 2022-10-09 DIAGNOSIS — M25562 Pain in left knee: Secondary | ICD-10-CM | POA: Diagnosis not present

## 2022-10-09 DIAGNOSIS — M79672 Pain in left foot: Secondary | ICD-10-CM | POA: Diagnosis not present

## 2022-10-09 DIAGNOSIS — M79605 Pain in left leg: Secondary | ICD-10-CM | POA: Diagnosis not present

## 2022-10-10 DIAGNOSIS — E119 Type 2 diabetes mellitus without complications: Secondary | ICD-10-CM | POA: Diagnosis not present

## 2022-10-10 DIAGNOSIS — M6281 Muscle weakness (generalized): Secondary | ICD-10-CM | POA: Diagnosis not present

## 2022-10-10 DIAGNOSIS — F4321 Adjustment disorder with depressed mood: Secondary | ICD-10-CM | POA: Diagnosis not present

## 2022-10-10 DIAGNOSIS — R293 Abnormal posture: Secondary | ICD-10-CM | POA: Diagnosis not present

## 2022-10-11 DIAGNOSIS — M6281 Muscle weakness (generalized): Secondary | ICD-10-CM | POA: Diagnosis not present

## 2022-10-11 DIAGNOSIS — E119 Type 2 diabetes mellitus without complications: Secondary | ICD-10-CM | POA: Diagnosis not present

## 2022-10-11 DIAGNOSIS — R293 Abnormal posture: Secondary | ICD-10-CM | POA: Diagnosis not present

## 2022-10-12 DIAGNOSIS — R293 Abnormal posture: Secondary | ICD-10-CM | POA: Diagnosis not present

## 2022-10-12 DIAGNOSIS — M25572 Pain in left ankle and joints of left foot: Secondary | ICD-10-CM | POA: Diagnosis not present

## 2022-10-12 DIAGNOSIS — R600 Localized edema: Secondary | ICD-10-CM | POA: Diagnosis not present

## 2022-10-12 DIAGNOSIS — M1712 Unilateral primary osteoarthritis, left knee: Secondary | ICD-10-CM | POA: Diagnosis not present

## 2022-10-12 DIAGNOSIS — E119 Type 2 diabetes mellitus without complications: Secondary | ICD-10-CM | POA: Diagnosis not present

## 2022-10-12 DIAGNOSIS — M6281 Muscle weakness (generalized): Secondary | ICD-10-CM | POA: Diagnosis not present

## 2022-10-12 DIAGNOSIS — M79605 Pain in left leg: Secondary | ICD-10-CM | POA: Diagnosis not present

## 2022-10-12 DIAGNOSIS — G8929 Other chronic pain: Secondary | ICD-10-CM | POA: Diagnosis not present

## 2022-10-13 DIAGNOSIS — M6281 Muscle weakness (generalized): Secondary | ICD-10-CM | POA: Diagnosis not present

## 2022-10-13 DIAGNOSIS — E119 Type 2 diabetes mellitus without complications: Secondary | ICD-10-CM | POA: Diagnosis not present

## 2022-10-13 DIAGNOSIS — R293 Abnormal posture: Secondary | ICD-10-CM | POA: Diagnosis not present

## 2022-10-16 DIAGNOSIS — R293 Abnormal posture: Secondary | ICD-10-CM | POA: Diagnosis not present

## 2022-10-16 DIAGNOSIS — E119 Type 2 diabetes mellitus without complications: Secondary | ICD-10-CM | POA: Diagnosis not present

## 2022-10-16 DIAGNOSIS — M6281 Muscle weakness (generalized): Secondary | ICD-10-CM | POA: Diagnosis not present

## 2022-10-17 DIAGNOSIS — R293 Abnormal posture: Secondary | ICD-10-CM | POA: Diagnosis not present

## 2022-10-17 DIAGNOSIS — M6281 Muscle weakness (generalized): Secondary | ICD-10-CM | POA: Diagnosis not present

## 2022-10-17 DIAGNOSIS — E119 Type 2 diabetes mellitus without complications: Secondary | ICD-10-CM | POA: Diagnosis not present

## 2022-10-19 DIAGNOSIS — R293 Abnormal posture: Secondary | ICD-10-CM | POA: Diagnosis not present

## 2022-10-19 DIAGNOSIS — E119 Type 2 diabetes mellitus without complications: Secondary | ICD-10-CM | POA: Diagnosis not present

## 2022-10-19 DIAGNOSIS — M6281 Muscle weakness (generalized): Secondary | ICD-10-CM | POA: Diagnosis not present

## 2022-10-20 DIAGNOSIS — M6281 Muscle weakness (generalized): Secondary | ICD-10-CM | POA: Diagnosis not present

## 2022-10-20 DIAGNOSIS — E119 Type 2 diabetes mellitus without complications: Secondary | ICD-10-CM | POA: Diagnosis not present

## 2022-10-20 DIAGNOSIS — R293 Abnormal posture: Secondary | ICD-10-CM | POA: Diagnosis not present

## 2022-10-23 DIAGNOSIS — R293 Abnormal posture: Secondary | ICD-10-CM | POA: Diagnosis not present

## 2022-10-23 DIAGNOSIS — E119 Type 2 diabetes mellitus without complications: Secondary | ICD-10-CM | POA: Diagnosis not present

## 2022-10-23 DIAGNOSIS — M6281 Muscle weakness (generalized): Secondary | ICD-10-CM | POA: Diagnosis not present

## 2022-10-24 DIAGNOSIS — M6281 Muscle weakness (generalized): Secondary | ICD-10-CM | POA: Diagnosis not present

## 2022-10-24 DIAGNOSIS — F4321 Adjustment disorder with depressed mood: Secondary | ICD-10-CM | POA: Diagnosis not present

## 2022-10-24 DIAGNOSIS — E119 Type 2 diabetes mellitus without complications: Secondary | ICD-10-CM | POA: Diagnosis not present

## 2022-10-24 DIAGNOSIS — R293 Abnormal posture: Secondary | ICD-10-CM | POA: Diagnosis not present

## 2022-10-25 DIAGNOSIS — M6281 Muscle weakness (generalized): Secondary | ICD-10-CM | POA: Diagnosis not present

## 2022-10-25 DIAGNOSIS — E119 Type 2 diabetes mellitus without complications: Secondary | ICD-10-CM | POA: Diagnosis not present

## 2022-10-25 DIAGNOSIS — R293 Abnormal posture: Secondary | ICD-10-CM | POA: Diagnosis not present

## 2022-10-26 DIAGNOSIS — M6281 Muscle weakness (generalized): Secondary | ICD-10-CM | POA: Diagnosis not present

## 2022-10-26 DIAGNOSIS — E119 Type 2 diabetes mellitus without complications: Secondary | ICD-10-CM | POA: Diagnosis not present

## 2022-10-26 DIAGNOSIS — R293 Abnormal posture: Secondary | ICD-10-CM | POA: Diagnosis not present

## 2022-10-27 DIAGNOSIS — N3281 Overactive bladder: Secondary | ICD-10-CM | POA: Diagnosis not present

## 2022-10-27 DIAGNOSIS — E669 Obesity, unspecified: Secondary | ICD-10-CM | POA: Diagnosis not present

## 2022-10-27 DIAGNOSIS — E1169 Type 2 diabetes mellitus with other specified complication: Secondary | ICD-10-CM | POA: Diagnosis not present

## 2022-10-27 DIAGNOSIS — Z7984 Long term (current) use of oral hypoglycemic drugs: Secondary | ICD-10-CM | POA: Diagnosis not present

## 2022-10-27 DIAGNOSIS — Z6839 Body mass index (BMI) 39.0-39.9, adult: Secondary | ICD-10-CM | POA: Diagnosis not present

## 2022-10-27 DIAGNOSIS — R293 Abnormal posture: Secondary | ICD-10-CM | POA: Diagnosis not present

## 2022-10-27 DIAGNOSIS — M6281 Muscle weakness (generalized): Secondary | ICD-10-CM | POA: Diagnosis not present

## 2022-10-27 DIAGNOSIS — R6889 Other general symptoms and signs: Secondary | ICD-10-CM | POA: Diagnosis not present

## 2022-10-27 DIAGNOSIS — D6489 Other specified anemias: Secondary | ICD-10-CM | POA: Diagnosis not present

## 2022-10-27 DIAGNOSIS — E119 Type 2 diabetes mellitus without complications: Secondary | ICD-10-CM | POA: Diagnosis not present

## 2022-10-30 DIAGNOSIS — E119 Type 2 diabetes mellitus without complications: Secondary | ICD-10-CM | POA: Diagnosis not present

## 2022-10-30 DIAGNOSIS — M6281 Muscle weakness (generalized): Secondary | ICD-10-CM | POA: Diagnosis not present

## 2022-10-30 DIAGNOSIS — R293 Abnormal posture: Secondary | ICD-10-CM | POA: Diagnosis not present

## 2022-10-31 DIAGNOSIS — E119 Type 2 diabetes mellitus without complications: Secondary | ICD-10-CM | POA: Diagnosis not present

## 2022-10-31 DIAGNOSIS — F4321 Adjustment disorder with depressed mood: Secondary | ICD-10-CM | POA: Diagnosis not present

## 2022-10-31 DIAGNOSIS — M6281 Muscle weakness (generalized): Secondary | ICD-10-CM | POA: Diagnosis not present

## 2022-10-31 DIAGNOSIS — R293 Abnormal posture: Secondary | ICD-10-CM | POA: Diagnosis not present

## 2022-11-01 DIAGNOSIS — E119 Type 2 diabetes mellitus without complications: Secondary | ICD-10-CM | POA: Diagnosis not present

## 2022-11-01 DIAGNOSIS — R293 Abnormal posture: Secondary | ICD-10-CM | POA: Diagnosis not present

## 2022-11-01 DIAGNOSIS — M6281 Muscle weakness (generalized): Secondary | ICD-10-CM | POA: Diagnosis not present

## 2022-11-02 DIAGNOSIS — E119 Type 2 diabetes mellitus without complications: Secondary | ICD-10-CM | POA: Diagnosis not present

## 2022-11-02 DIAGNOSIS — R293 Abnormal posture: Secondary | ICD-10-CM | POA: Diagnosis not present

## 2022-11-02 DIAGNOSIS — M6281 Muscle weakness (generalized): Secondary | ICD-10-CM | POA: Diagnosis not present

## 2022-11-03 DIAGNOSIS — E119 Type 2 diabetes mellitus without complications: Secondary | ICD-10-CM | POA: Diagnosis not present

## 2022-11-03 DIAGNOSIS — M6281 Muscle weakness (generalized): Secondary | ICD-10-CM | POA: Diagnosis not present

## 2022-11-03 DIAGNOSIS — R293 Abnormal posture: Secondary | ICD-10-CM | POA: Diagnosis not present

## 2022-11-06 DIAGNOSIS — E119 Type 2 diabetes mellitus without complications: Secondary | ICD-10-CM | POA: Diagnosis not present

## 2022-11-06 DIAGNOSIS — M6281 Muscle weakness (generalized): Secondary | ICD-10-CM | POA: Diagnosis not present

## 2022-11-06 DIAGNOSIS — R293 Abnormal posture: Secondary | ICD-10-CM | POA: Diagnosis not present

## 2022-11-07 DIAGNOSIS — R293 Abnormal posture: Secondary | ICD-10-CM | POA: Diagnosis not present

## 2022-11-07 DIAGNOSIS — F4321 Adjustment disorder with depressed mood: Secondary | ICD-10-CM | POA: Diagnosis not present

## 2022-11-07 DIAGNOSIS — E119 Type 2 diabetes mellitus without complications: Secondary | ICD-10-CM | POA: Diagnosis not present

## 2022-11-07 DIAGNOSIS — F5101 Primary insomnia: Secondary | ICD-10-CM | POA: Diagnosis not present

## 2022-11-07 DIAGNOSIS — M6281 Muscle weakness (generalized): Secondary | ICD-10-CM | POA: Diagnosis not present

## 2022-11-08 DIAGNOSIS — M6281 Muscle weakness (generalized): Secondary | ICD-10-CM | POA: Diagnosis not present

## 2022-11-08 DIAGNOSIS — R293 Abnormal posture: Secondary | ICD-10-CM | POA: Diagnosis not present

## 2022-11-08 DIAGNOSIS — E119 Type 2 diabetes mellitus without complications: Secondary | ICD-10-CM | POA: Diagnosis not present

## 2022-11-09 DIAGNOSIS — M6281 Muscle weakness (generalized): Secondary | ICD-10-CM | POA: Diagnosis not present

## 2022-11-09 DIAGNOSIS — R293 Abnormal posture: Secondary | ICD-10-CM | POA: Diagnosis not present

## 2022-11-09 DIAGNOSIS — E119 Type 2 diabetes mellitus without complications: Secondary | ICD-10-CM | POA: Diagnosis not present

## 2022-11-10 DIAGNOSIS — E119 Type 2 diabetes mellitus without complications: Secondary | ICD-10-CM | POA: Diagnosis not present

## 2022-11-10 DIAGNOSIS — R293 Abnormal posture: Secondary | ICD-10-CM | POA: Diagnosis not present

## 2022-11-10 DIAGNOSIS — M6281 Muscle weakness (generalized): Secondary | ICD-10-CM | POA: Diagnosis not present

## 2022-11-13 DIAGNOSIS — R5381 Other malaise: Secondary | ICD-10-CM | POA: Diagnosis not present

## 2022-11-13 DIAGNOSIS — E119 Type 2 diabetes mellitus without complications: Secondary | ICD-10-CM | POA: Diagnosis not present

## 2022-11-13 DIAGNOSIS — M6281 Muscle weakness (generalized): Secondary | ICD-10-CM | POA: Diagnosis not present

## 2022-11-13 DIAGNOSIS — M6250 Muscle wasting and atrophy, not elsewhere classified, unspecified site: Secondary | ICD-10-CM | POA: Diagnosis not present

## 2022-11-13 DIAGNOSIS — Z8616 Personal history of COVID-19: Secondary | ICD-10-CM | POA: Diagnosis not present

## 2022-11-13 DIAGNOSIS — R2681 Unsteadiness on feet: Secondary | ICD-10-CM | POA: Diagnosis not present

## 2022-11-13 DIAGNOSIS — R293 Abnormal posture: Secondary | ICD-10-CM | POA: Diagnosis not present

## 2022-11-14 DIAGNOSIS — E119 Type 2 diabetes mellitus without complications: Secondary | ICD-10-CM | POA: Diagnosis not present

## 2022-11-14 DIAGNOSIS — M6281 Muscle weakness (generalized): Secondary | ICD-10-CM | POA: Diagnosis not present

## 2022-11-14 DIAGNOSIS — R293 Abnormal posture: Secondary | ICD-10-CM | POA: Diagnosis not present

## 2022-11-14 DIAGNOSIS — F4321 Adjustment disorder with depressed mood: Secondary | ICD-10-CM | POA: Diagnosis not present

## 2022-11-15 DIAGNOSIS — E119 Type 2 diabetes mellitus without complications: Secondary | ICD-10-CM | POA: Diagnosis not present

## 2022-11-15 DIAGNOSIS — M6281 Muscle weakness (generalized): Secondary | ICD-10-CM | POA: Diagnosis not present

## 2022-11-15 DIAGNOSIS — R293 Abnormal posture: Secondary | ICD-10-CM | POA: Diagnosis not present

## 2022-11-17 DIAGNOSIS — M6281 Muscle weakness (generalized): Secondary | ICD-10-CM | POA: Diagnosis not present

## 2022-11-17 DIAGNOSIS — E119 Type 2 diabetes mellitus without complications: Secondary | ICD-10-CM | POA: Diagnosis not present

## 2022-11-17 DIAGNOSIS — R293 Abnormal posture: Secondary | ICD-10-CM | POA: Diagnosis not present

## 2022-11-21 DIAGNOSIS — R293 Abnormal posture: Secondary | ICD-10-CM | POA: Diagnosis not present

## 2022-11-21 DIAGNOSIS — H9202 Otalgia, left ear: Secondary | ICD-10-CM | POA: Diagnosis not present

## 2022-11-21 DIAGNOSIS — E119 Type 2 diabetes mellitus without complications: Secondary | ICD-10-CM | POA: Diagnosis not present

## 2022-11-21 DIAGNOSIS — F4321 Adjustment disorder with depressed mood: Secondary | ICD-10-CM | POA: Diagnosis not present

## 2022-11-21 DIAGNOSIS — I10 Essential (primary) hypertension: Secondary | ICD-10-CM | POA: Diagnosis not present

## 2022-11-21 DIAGNOSIS — I152 Hypertension secondary to endocrine disorders: Secondary | ICD-10-CM | POA: Diagnosis not present

## 2022-11-21 DIAGNOSIS — Z7984 Long term (current) use of oral hypoglycemic drugs: Secondary | ICD-10-CM | POA: Diagnosis not present

## 2022-11-21 DIAGNOSIS — E1159 Type 2 diabetes mellitus with other circulatory complications: Secondary | ICD-10-CM | POA: Diagnosis not present

## 2022-11-21 DIAGNOSIS — M6281 Muscle weakness (generalized): Secondary | ICD-10-CM | POA: Diagnosis not present

## 2022-11-21 DIAGNOSIS — I4891 Unspecified atrial fibrillation: Secondary | ICD-10-CM | POA: Diagnosis not present

## 2022-11-22 ENCOUNTER — Emergency Department (HOSPITAL_COMMUNITY)
Admission: EM | Admit: 2022-11-22 | Discharge: 2022-11-22 | Disposition: A | Discharge status: Home / Self Care | Payer: Medicare PPO | Attending: Emergency Medicine | Admitting: Emergency Medicine

## 2022-11-22 ENCOUNTER — Other Ambulatory Visit (HOSPITAL_COMMUNITY): Payer: Self-pay

## 2022-11-22 DIAGNOSIS — I4891 Unspecified atrial fibrillation: Secondary | ICD-10-CM | POA: Diagnosis not present

## 2022-11-22 DIAGNOSIS — Z7901 Long term (current) use of anticoagulants: Secondary | ICD-10-CM | POA: Diagnosis not present

## 2022-11-22 DIAGNOSIS — Z7401 Bed confinement status: Secondary | ICD-10-CM | POA: Diagnosis not present

## 2022-11-22 DIAGNOSIS — Z79899 Other long term (current) drug therapy: Secondary | ICD-10-CM | POA: Diagnosis not present

## 2022-11-22 DIAGNOSIS — M6281 Muscle weakness (generalized): Secondary | ICD-10-CM | POA: Diagnosis not present

## 2022-11-22 DIAGNOSIS — E119 Type 2 diabetes mellitus without complications: Secondary | ICD-10-CM | POA: Diagnosis not present

## 2022-11-22 DIAGNOSIS — I10 Essential (primary) hypertension: Secondary | ICD-10-CM | POA: Insufficient documentation

## 2022-11-22 DIAGNOSIS — R293 Abnormal posture: Secondary | ICD-10-CM | POA: Diagnosis not present

## 2022-11-22 DIAGNOSIS — D72829 Elevated white blood cell count, unspecified: Secondary | ICD-10-CM | POA: Diagnosis not present

## 2022-11-22 DIAGNOSIS — N390 Urinary tract infection, site not specified: Secondary | ICD-10-CM | POA: Diagnosis not present

## 2022-11-22 DIAGNOSIS — R300 Dysuria: Secondary | ICD-10-CM | POA: Diagnosis not present

## 2022-11-22 DIAGNOSIS — M2550 Pain in unspecified joint: Secondary | ICD-10-CM | POA: Diagnosis not present

## 2022-11-22 LAB — URINALYSIS, W/ REFLEX TO CULTURE (INFECTION SUSPECTED)
Bilirubin Urine: NEGATIVE
Glucose, UA: NEGATIVE mg/dL
Ketones, ur: NEGATIVE mg/dL
Nitrite: NEGATIVE
Protein, ur: NEGATIVE mg/dL
Specific Gravity, Urine: 1.012 (ref 1.005–1.030)
WBC, UA: >50 WBC/hpf (ref 0–5)
pH: 6 (ref 5.0–8.0)

## 2022-11-22 LAB — BASIC METABOLIC PANEL
Anion gap: 13 (ref 5–15)
BUN: 22 mg/dL (ref 8–23)
CO2: 21 mmol/L — ABNORMAL LOW (ref 22–32)
Calcium: 8.9 mg/dL (ref 8.9–10.3)
Chloride: 101 mmol/L (ref 98–111)
Creatinine, Ser: 1.13 mg/dL — ABNORMAL HIGH (ref 0.44–1.00)
GFR, Estimated: 48 mL/min — ABNORMAL LOW (ref >60)
Glucose, Bld: 135 mg/dL — ABNORMAL HIGH (ref 70–99)
Potassium: 4.5 mmol/L (ref 3.5–5.1)
Sodium: 135 mmol/L (ref 135–145)

## 2022-11-22 LAB — CBC WITH DIFFERENTIAL/PLATELET
Abs Immature Granulocytes: 0.02 10*3/uL (ref 0.00–0.07)
Basophils Absolute: 0 10*3/uL (ref 0.0–0.1)
Basophils Relative: 1 %
Eosinophils Absolute: 0.3 10*3/uL (ref 0.0–0.5)
Eosinophils Relative: 7 %
HCT: 33.7 % — ABNORMAL LOW (ref 36.0–46.0)
Hemoglobin: 10.7 g/dL — ABNORMAL LOW (ref 12.0–15.0)
Immature Granulocytes: 0 %
Lymphocytes Relative: 39 %
Lymphs Abs: 1.9 10*3/uL (ref 0.7–4.0)
MCH: 27.2 pg (ref 26.0–34.0)
MCHC: 31.8 g/dL (ref 30.0–36.0)
MCV: 85.8 fL (ref 80.0–100.0)
Monocytes Absolute: 0.3 10*3/uL (ref 0.1–1.0)
Monocytes Relative: 7 %
Neutro Abs: 2.3 10*3/uL (ref 1.7–7.7)
Neutrophils Relative %: 46 %
Platelets: 243 10*3/uL (ref 150–400)
RBC: 3.93 MIL/uL (ref 3.87–5.11)
RDW: 14.1 % (ref 11.5–15.5)
WBC: 4.9 10*3/uL (ref 4.0–10.5)
nRBC: 0 % (ref 0.0–0.2)

## 2022-11-22 MED ORDER — SODIUM CHLORIDE 0.9 % IV SOLN
1.0000 g | Freq: Once | INTRAVENOUS | Status: AC
  Administered 2022-11-22: 1 g via INTRAVENOUS
  Filled 2022-11-22: qty 10

## 2022-11-22 MED ORDER — CEPHALEXIN 500 MG PO CAPS
500.0000 mg | ORAL_CAPSULE | Freq: Four times a day (QID) | ORAL | 0 refills | Status: DC
  Filled 2022-11-22: qty 20, 5d supply, fill #0

## 2022-11-22 NOTE — Discharge Instructions (Signed)
You have been evaluated for your symptoms.  You have symptoms consistent with a urinary tract infection.  Please take antibiotic as prescribed.  Follow-up with your doctor for further care, return if your condition worsen, or if you have other concerns.

## 2022-11-22 NOTE — ED Provider Notes (Signed)
EMERGENCY DEPARTMENT AT [facility] Provider Note   CSN: [id number] Arrival date & time: 11/22/22  0110     History  Chief Complaint  Patient presents with   Dysuria    Kayla [name] is a 82 y.o. female.  The history is provided by the patient, medical records and the EMS personnel. No language interpreter was used.  Dysuria    82 year old female with multiple comorbidities which includes diabetes, hypertension, PE, multiple falls, malnutrition, A-fib on Eliquis who was brought here via EMS for evaluation of urinary discomfort.  Patient report for the past several days she noticed increased urinary frequency and last night she also experiencing some burning on urination.  Furthermore, last night she felt a pressure sensation in her left hand that spreads towards her left elbow.  She also felt flushed and hot and happened transiently.  She did not endorse any associated chest pain or shortness of breath no dizziness no nausea vomiting or diarrhea.  She did share her symptoms with the nurse at her facility who contact the doctor and patient was brought here for further assessment.  At this time she mentioned her symptoms minimal.  Home Medications Prior to Admission medications   Medication Sig Start Date End Date Taking? Authorizing Provider  Accu-Chek Softclix Lancets lancets 1 each by Other route daily. Use as instructed 03/30/20   Ronnald Nian, MD  acetaminophen (TYLENOL) 325 MG tablet Take 650 mg by mouth every 8 (eight) hours as needed (pain).    [provider]  Amino Acids-Protein Hydrolys (PRO-STAT AWC) LIQD Take 30 mLs by mouth 2 (two) times daily.    [provider]  apixaban (ELIQUIS) 5 MG TABS tablet Take 5 mg by mouth 2 (two) times daily.    [provider]  ascorbic acid (VITAMIN C) 500 MG tablet Take 1 tablet (500 mg total) by mouth daily. Patient taking differently: Take 500 mg by mouth 2 (two) times daily. 08/30/21    Regalado, Belkys A, MD  atorvastatin (LIPITOR) 80 MG tablet Take 1 tablet (80 mg total) by mouth daily. Patient taking differently: Take 80 mg by mouth at bedtime. 01/20/20   Ronnald Nian, MD  carvedilol (COREG) 6.25 MG tablet Take 1 tablet (6.25 mg total) by mouth 2 (two) times daily. 03/31/21   Chandrasekhar, Rondel Jumbo, MD  cefdinir (OMNICEF) 300 MG capsule Take 1 capsule (300 mg total) by mouth every 12 (twelve) hours. 12/03/21   Rhetta Mura, MD  docusate sodium (COLACE) 100 MG capsule Take 100 mg by mouth every morning. Take with apply juice    [provider]  ezetimibe (ZETIA) 10 MG tablet Take 10 mg by mouth at bedtime.    [provider]  ferrous sulfate 325 (65 FE) MG tablet Take 325 mg by mouth 2 (two) times daily.    [provider]  gabapentin (NEURONTIN) 100 MG capsule Take 1 capsule (100 mg total) by mouth daily. 12/03/21   Rhetta Mura, MD  glucose blood test strip 1 each by Other route as needed for other. Use as instructed Patient taking differently: 1 each by Other route daily. Use as instructed 12/31/20   Ronnald Nian, MD  ipratropium-albuterol (DUONEB) 0.5-2.5 (3) MG/3ML SOLN Take 3 mLs by nebulization every 4 (four) hours as needed. Patient taking differently: Take 3 mLs by nebulization every 4 (four) hours as needed (wheezing/ shortness of breath). 08/29/21   Regalado, Belkys A, MD  lidocaine (LIDODERM) 5 % Place  1 patch onto the skin See admin instructions. Apply 1 patch transdermally to right shoulder and 1 patch to right elbow - every afternoon. Remove & Discard patch within 12 hours or as directed by MD    [provider]  lidocaine 4 % Place 2 patches onto the skin See admin instructions. Apply 2 patches transdermally to left leg every morning, remove nightly    [provider]  losartan (COZAAR) 25 MG tablet Take 25 mg by mouth every morning.    [provider]  melatonin 5 MG TABS Take 5 mg by mouth at  bedtime as needed (sleep).    [provider]  Menthol, Topical Analgesic, (BIOFREEZE) 4 % GEL Apply 1 application. topically See admin instructions. Apply thin layer topically to right shoulder and arm three times daily for pain, may apply to other affected areas also    [provider]  nitroGLYCERIN (NITROSTAT) 0.4 MG SL tablet Take up to 3 tablets Patient taking differently: 0.4 mg every 5 (five) minutes as needed for chest pain (max 3 tablets). 03/31/21   Christell Constant, MD      Allergies    Patient has no known allergies.    Review of Systems   Review of Systems  Genitourinary:  Positive for dysuria.  All other systems reviewed and are negative.   Physical Exam Updated Vital Signs BP (!) 135/95   Pulse 82   Temp 98 F (36.7 C) (Oral)   Resp 16   SpO2 98%  Physical Exam Vitals and nursing note reviewed.  Constitutional:      General: She is not in acute distress.    Appearance: She is well-developed. She is obese.  HENT:     Head: Atraumatic.  Eyes:     Conjunctiva/sclera: Conjunctivae normal.  Cardiovascular:     Rate and Rhythm: Normal rate and regular rhythm.  Pulmonary:     Effort: Pulmonary effort is normal.  Abdominal:     Palpations: Abdomen is soft.     Tenderness: There is no abdominal tenderness.  Musculoskeletal:     Cervical back: Neck supple.  Skin:    Capillary Refill: Capillary refill takes less than 2 seconds.     Findings: No rash.  Neurological:     Mental Status: She is alert and oriented to person, place, and time.  Psychiatric:        Mood and Affect: Mood normal.     ED Results / Procedures / Treatments   Labs (all labs ordered are listed, but only abnormal results are displayed) Labs Reviewed  URINALYSIS, W/ REFLEX TO CULTURE (INFECTION SUSPECTED) - Abnormal; Notable for the following components:      Result Value   APPearance CLOUDY (*)    Hgb urine dipstick SMALL (*)    Leukocytes,Ua LARGE (*)     Bacteria, UA RARE (*)    All other components within normal limits  BASIC METABOLIC PANEL - Abnormal; Notable for the following components:   CO2 21 (*)    Glucose, Bld 135 (*)    Creatinine, Ser 1.13 (*)    GFR, Estimated 48 (*)    All other components within normal limits  CBC WITH DIFFERENTIAL/PLATELET - Abnormal; Notable for the following components:   Hemoglobin 10.7 (*)    HCT 33.7 (*)    All other components within normal limits  URINE CULTURE    EKG EKG Interpretation  Date/Time:  Wednesday November 22 2022 07:19:54 EDT Ventricular Rate:  59  PR Interval:  166 QRS Duration: 89 QT Interval:  449 QTC Calculation: 445 R Axis:   -5 Text Interpretation: Sinus rhythm Abnormal R-wave progression, early transition Left ventricular hypertrophy rate is slower than prior 1/24 Confirmed by Meridee Score [phone]) on 11/22/2022 7:27:36 AM  Date: 11/22/2022  Rate: 59  Rhythm: normal sinus rhythm  QRS Axis: normal  Intervals: normal  ST/T Wave abnormalities: normal  Conduction Disutrbances: none  Narrative Interpretation:   Old EKG Reviewed: No significant changes noted    Radiology No results found.  Procedures Procedures    Medications Ordered in ED Medications - No data to display  ED Course/ Medical Decision Making/ A&P Clinical Course as of 11/22/22 0811  Wed Nov 22, 2022  [id number] 82 year old female brought in from her facility for evaluation of dysuria.  Patient with history of frequent UTIs.  She denies any complaints.  Urinalysis showing possible infection sent for culture.  Will start on antibiotics until culture results.  No indications for admission at this time. [MB]    Clinical Course User Index [MB] Terrilee Files, MD                             Medical Decision Making Amount and/or Complexity of Data Reviewed ECG/medicine tests: ordered.   BP (!) 150/59   Pulse 69   Temp 98.1 F (36.7 C)   Resp 18   SpO2 97%   68:60 AM  82 year old female with  multiple comorbidities which includes diabetes, hypertension, PE, multiple falls, malnutrition, A-fib on Eliquis who was brought here via EMS for evaluation of urinary discomfort.  Patient report for the past several days she noticed increased urinary frequency and last night she also experiencing some burning on urination.  Furthermore, last night she felt a pressure sensation in her left hand that spreads towards her left elbow.  She also felt flushed and hot and happened transiently.  She did not endorse any associated chest pain or shortness of breath no dizziness no nausea vomiting or diarrhea.  She did share her symptoms with the nurse at her facility who contact the doctor and patient was brought here for further assessment.  At this time she mentioned her symptoms minimal.  On exam, obese female resting comfortably in bed appears to be in no acute discomfort.  Heart with normal rate and rhythm, lungs are otherwise clear to auscultation abdomen is soft nontender with active bowel sounds.  She has intact radial pulse bilaterally with normal sensation to bilateral arms with normal grip strength.  No overlying skin changes to left upper extremity.  She does wear an Radio broadcast assistant on her left heel.  -Labs ordered, independently viewed and interpreted by me.  Labs remarkable for UA showing large leukocytes, >50 WBC suggestive of UTI.  ROcephin given.  Normal WBC, Cr. 1.13 -The patient was maintained on a cardiac monitor.  I personally viewed and interpreted the cardiac monitored which showed an underlying rhythm of: NSR -Imaging including CXR considered but not performed as pt denies CP -This patient presents to the ED for concern of dysuria, this involves an extensive number of treatment options, and is a complaint that carries with it a high risk of complications and morbidity.  The differential diagnosis includes UTI, cystitis, kidney stone, dysuria -Co morbidities that complicate the patient evaluation  includes recurrent UTI, DM, HTN, afib -Treatment includes rocephin -Reevaluation of the patient after these medicines showed that the patient  stayed the same -PCP office notes or outside notes reviewed -Discussion with specialist attending Dr. Charm Barges -Escalation to admission/observation considered: patients feels much better, is comfortable with discharge, and will follow up with PCP -Prescription medication considered, patient comfortable with keflex -Social Determinant of Health considered which includes physical deconditioning  7:07 AM Patient's primary complaint is urinary discomfort for the past several days.  UTI present on urinalysis.  Will treat with Rocephin.  She did complaining of feeling flushed along with some tightness and pressure sensation to her left hand over to her left forearm/elbow.  Pain did not radiate towards her chest and she denies any chest pain.  I have low suspicion for ACS causing her symptoms.  Will obtain EKG and I have considered troponin but felt that's not appropriate based on her complaint.  Patient did endorse a tight sensation and extending to left elbow.  On exam of her L arm she has intact radial pulse, brisk cap refill, sensations intact throughout left arm exam without any signs of compartment syndrome, cellulitis, or signs of injury.  8:13 AM EKG overall reassuring.  On reassessment patient continued to endorse no chest pain.  I also discussed care with attending doctor Charm Barges, MD felt patient stable to be discharged back to facility with antibiotic for her UTI.  Return precautions given.         Final Clinical Impression(s) / ED Diagnoses Final diagnoses:  Lower urinary tract infectious disease    Rx / DC Orders ED Discharge Orders          Ordered    cephALEXin (KEFLEX) 500 MG capsule  4 times daily        11/22/22 0817              Fayrene Helper, PA-C 11/22/22 [id number]    Terrilee Files, MD 11/22/22 1725

## 2022-11-22 NOTE — ED Provider Triage Note (Signed)
Emergency Medicine Provider Triage Evaluation Note  Kayla [name] , a 82 y.o. female  was evaluated in triage.  Pt complains of burning sensation with urination denies any fever chills cough congestion or some pain or nausea or vomiting..  Review of Systems  Positive: Urinary frequency, dysuria Negative: Nausea vomiting  Physical Exam  BP (!) 144/82 (BP Location: Right Arm)   Pulse 78   Temp 98 F (36.7 C) (Oral)   Resp 19   SpO2 100%  Gen:   Awake, no distress   Resp:  Normal effort  MSK:   Moves extremities without difficulty  Other:    Medical Decision Making  Medically screening exam initiated at 1:43 AM.  Appropriate orders placed.  Kayla [name] was informed that the remainder of the evaluation will be completed by another provider, this initial triage assessment does not replace that evaluation, and the importance of remaining in the ED until their evaluation is complete.  Lab work has been order will need further workup.   Carroll Sage, PA-C 11/22/22 [phone]

## 2022-11-22 NOTE — ED Notes (Signed)
[facility] called; next

## 2022-11-22 NOTE — ED Triage Notes (Addendum)
Pt having burning with urination started this morning. Denies urgency, denies pelvic/abdominal pain. No fevers. Does have Utis hx with frequent recurrence. Pressure ulcer of L heel in which float booties were on upon arrival.

## 2022-11-22 NOTE — ED Notes (Signed)
Report given to Engineer, manufacturing systems at [facility].

## 2022-11-23 DIAGNOSIS — E119 Type 2 diabetes mellitus without complications: Secondary | ICD-10-CM | POA: Diagnosis not present

## 2022-11-23 DIAGNOSIS — M6281 Muscle weakness (generalized): Secondary | ICD-10-CM | POA: Diagnosis not present

## 2022-11-23 DIAGNOSIS — R293 Abnormal posture: Secondary | ICD-10-CM | POA: Diagnosis not present

## 2022-11-24 LAB — URINE CULTURE: Culture: 50000 — AB

## 2022-11-25 ENCOUNTER — Telehealth (HOSPITAL_BASED_OUTPATIENT_CLINIC_OR_DEPARTMENT_OTHER): Payer: Self-pay | Admitting: *Deleted

## 2022-11-25 NOTE — Telephone Encounter (Signed)
Post ED Visit - Positive Culture Follow-up  Culture report reviewed by antimicrobial stewardship pharmacist: [facility] []  Enzo Bi, Pharm.D. []  Celedonio Miyamoto, [address].D., BCPS AQ-ID []  Garvin Fila, Pharm.D., BCPS []  Georgina Pillion, [address].D., BCPS []  [place], [address].D., BCPS, AAHIVP []  Estella Husk, Pharm.D., BCPS, AAHIVP []  Lysle Pearl, PharmD, BCPS []  Phillips Climes, PharmD, BCPS []  Agapito Games, PharmD, BCPS []  Verlan Friends, PharmD []  Mervyn Gay, PharmD, BCPS [x]  Delmar Landau, PharmD  [facility] []  Len Childs, PharmD []  Greer Pickerel, PharmD []  Adalberto Cole, PharmD []  Perlie Gold, Rph []  Lonell Face) Jean Rosenthal, PharmD []  Earl Many, PharmD []  Junita Push, PharmD []  Dorna Leitz, PharmD []  Terrilee Files, PharmD []  Lynann Beaver, PharmD []  Keturah Barre, PharmD []  Loralee Pacas, PharmD []  Bernadene Person, PharmD   Positive urine culture Treated with Cephalexin, organism sensitive to the same and no further patient follow-up is required at this time.  Patsey Berthold 11/25/2022, 12:32 PM

## 2022-11-28 ENCOUNTER — Telehealth: Payer: Self-pay

## 2022-11-28 DIAGNOSIS — M6281 Muscle weakness (generalized): Secondary | ICD-10-CM | POA: Diagnosis not present

## 2022-11-28 DIAGNOSIS — E119 Type 2 diabetes mellitus without complications: Secondary | ICD-10-CM | POA: Diagnosis not present

## 2022-11-28 DIAGNOSIS — R293 Abnormal posture: Secondary | ICD-10-CM | POA: Diagnosis not present

## 2022-11-28 NOTE — Telephone Encounter (Signed)
Transition Care Management Unsuccessful Follow-up Telephone Call  Date of discharge and from where:  [facility] 11/22/2022  Attempts:  1st Attempt  Reason for unsuccessful TCM follow-up call:  Unable to leave message   Lenard Forth [facility] Guide, [facility] [phone] [address] [place], [place], [place] [address] Phone: [phone] Email: [email or web address]

## 2022-11-29 ENCOUNTER — Telehealth: Payer: Self-pay

## 2022-11-29 DIAGNOSIS — R1312 Dysphagia, oropharyngeal phase: Secondary | ICD-10-CM | POA: Diagnosis not present

## 2022-11-29 DIAGNOSIS — M48061 Spinal stenosis, lumbar region without neurogenic claudication: Secondary | ICD-10-CM | POA: Diagnosis not present

## 2022-11-29 DIAGNOSIS — M6281 Muscle weakness (generalized): Secondary | ICD-10-CM | POA: Diagnosis not present

## 2022-11-29 DIAGNOSIS — R2689 Other abnormalities of gait and mobility: Secondary | ICD-10-CM | POA: Diagnosis not present

## 2022-11-29 DIAGNOSIS — R41841 Cognitive communication deficit: Secondary | ICD-10-CM | POA: Diagnosis not present

## 2022-11-29 NOTE — Telephone Encounter (Signed)
Transition Care Management Unsuccessful Follow-up Telephone Call  Date of discharge and from where:  [facility] 4/24  Attempts:  2nd Attempt  Reason for unsuccessful TCM follow-up call:  Unable to leave message   Lenard Forth [facility] Guide, [facility] [phone] [address] [place], [place], [place] [address] Phone: [phone] Email: [email or web address]

## 2022-11-30 DIAGNOSIS — R1312 Dysphagia, oropharyngeal phase: Secondary | ICD-10-CM | POA: Diagnosis not present

## 2022-11-30 DIAGNOSIS — N390 Urinary tract infection, site not specified: Secondary | ICD-10-CM | POA: Diagnosis not present

## 2022-11-30 DIAGNOSIS — I890 Lymphedema, not elsewhere classified: Secondary | ICD-10-CM | POA: Diagnosis not present

## 2022-11-30 DIAGNOSIS — Z7984 Long term (current) use of oral hypoglycemic drugs: Secondary | ICD-10-CM | POA: Diagnosis not present

## 2022-11-30 DIAGNOSIS — R2689 Other abnormalities of gait and mobility: Secondary | ICD-10-CM | POA: Diagnosis not present

## 2022-11-30 DIAGNOSIS — R41841 Cognitive communication deficit: Secondary | ICD-10-CM | POA: Diagnosis not present

## 2022-11-30 DIAGNOSIS — Z7901 Long term (current) use of anticoagulants: Secondary | ICD-10-CM | POA: Diagnosis not present

## 2022-11-30 DIAGNOSIS — M6281 Muscle weakness (generalized): Secondary | ICD-10-CM | POA: Diagnosis not present

## 2022-11-30 DIAGNOSIS — E1159 Type 2 diabetes mellitus with other circulatory complications: Secondary | ICD-10-CM | POA: Diagnosis not present

## 2022-11-30 DIAGNOSIS — I152 Hypertension secondary to endocrine disorders: Secondary | ICD-10-CM | POA: Diagnosis not present

## 2022-11-30 DIAGNOSIS — I4891 Unspecified atrial fibrillation: Secondary | ICD-10-CM | POA: Diagnosis not present

## 2022-11-30 DIAGNOSIS — M48061 Spinal stenosis, lumbar region without neurogenic claudication: Secondary | ICD-10-CM | POA: Diagnosis not present

## 2022-12-01 DIAGNOSIS — J302 Other seasonal allergic rhinitis: Secondary | ICD-10-CM | POA: Diagnosis not present

## 2022-12-01 DIAGNOSIS — R0982 Postnasal drip: Secondary | ICD-10-CM | POA: Diagnosis not present

## 2022-12-01 DIAGNOSIS — R059 Cough, unspecified: Secondary | ICD-10-CM | POA: Diagnosis not present

## 2022-12-01 DIAGNOSIS — H6590 Unspecified nonsuppurative otitis media, unspecified ear: Secondary | ICD-10-CM | POA: Diagnosis not present

## 2022-12-07 DIAGNOSIS — R41841 Cognitive communication deficit: Secondary | ICD-10-CM | POA: Diagnosis not present

## 2022-12-07 DIAGNOSIS — R1312 Dysphagia, oropharyngeal phase: Secondary | ICD-10-CM | POA: Diagnosis not present

## 2022-12-07 DIAGNOSIS — M48061 Spinal stenosis, lumbar region without neurogenic claudication: Secondary | ICD-10-CM | POA: Diagnosis not present

## 2022-12-07 DIAGNOSIS — Z09 Encounter for follow-up examination after completed treatment for conditions other than malignant neoplasm: Secondary | ICD-10-CM | POA: Diagnosis not present

## 2022-12-07 DIAGNOSIS — Z87448 Personal history of other diseases of urinary system: Secondary | ICD-10-CM | POA: Diagnosis not present

## 2022-12-07 DIAGNOSIS — R2689 Other abnormalities of gait and mobility: Secondary | ICD-10-CM | POA: Diagnosis not present

## 2022-12-07 DIAGNOSIS — M6281 Muscle weakness (generalized): Secondary | ICD-10-CM | POA: Diagnosis not present

## 2022-12-08 DIAGNOSIS — R41841 Cognitive communication deficit: Secondary | ICD-10-CM | POA: Diagnosis not present

## 2022-12-08 DIAGNOSIS — R2689 Other abnormalities of gait and mobility: Secondary | ICD-10-CM | POA: Diagnosis not present

## 2022-12-08 DIAGNOSIS — M6281 Muscle weakness (generalized): Secondary | ICD-10-CM | POA: Diagnosis not present

## 2022-12-08 DIAGNOSIS — R1312 Dysphagia, oropharyngeal phase: Secondary | ICD-10-CM | POA: Diagnosis not present

## 2022-12-08 DIAGNOSIS — M48061 Spinal stenosis, lumbar region without neurogenic claudication: Secondary | ICD-10-CM | POA: Diagnosis not present

## 2022-12-11 DIAGNOSIS — R2689 Other abnormalities of gait and mobility: Secondary | ICD-10-CM | POA: Diagnosis not present

## 2022-12-11 DIAGNOSIS — R41841 Cognitive communication deficit: Secondary | ICD-10-CM | POA: Diagnosis not present

## 2022-12-11 DIAGNOSIS — F5101 Primary insomnia: Secondary | ICD-10-CM | POA: Diagnosis not present

## 2022-12-11 DIAGNOSIS — M48061 Spinal stenosis, lumbar region without neurogenic claudication: Secondary | ICD-10-CM | POA: Diagnosis not present

## 2022-12-11 DIAGNOSIS — M6281 Muscle weakness (generalized): Secondary | ICD-10-CM | POA: Diagnosis not present

## 2022-12-11 DIAGNOSIS — R1312 Dysphagia, oropharyngeal phase: Secondary | ICD-10-CM | POA: Diagnosis not present

## 2022-12-12 DIAGNOSIS — M48061 Spinal stenosis, lumbar region without neurogenic claudication: Secondary | ICD-10-CM | POA: Diagnosis not present

## 2022-12-12 DIAGNOSIS — R2689 Other abnormalities of gait and mobility: Secondary | ICD-10-CM | POA: Diagnosis not present

## 2022-12-12 DIAGNOSIS — R1312 Dysphagia, oropharyngeal phase: Secondary | ICD-10-CM | POA: Diagnosis not present

## 2022-12-12 DIAGNOSIS — F4321 Adjustment disorder with depressed mood: Secondary | ICD-10-CM | POA: Diagnosis not present

## 2022-12-12 DIAGNOSIS — M6281 Muscle weakness (generalized): Secondary | ICD-10-CM | POA: Diagnosis not present

## 2022-12-12 DIAGNOSIS — R41841 Cognitive communication deficit: Secondary | ICD-10-CM | POA: Diagnosis not present

## 2022-12-13 DIAGNOSIS — R2689 Other abnormalities of gait and mobility: Secondary | ICD-10-CM | POA: Diagnosis not present

## 2022-12-13 DIAGNOSIS — R41841 Cognitive communication deficit: Secondary | ICD-10-CM | POA: Diagnosis not present

## 2022-12-13 DIAGNOSIS — M6281 Muscle weakness (generalized): Secondary | ICD-10-CM | POA: Diagnosis not present

## 2022-12-13 DIAGNOSIS — R1312 Dysphagia, oropharyngeal phase: Secondary | ICD-10-CM | POA: Diagnosis not present

## 2022-12-13 DIAGNOSIS — M48061 Spinal stenosis, lumbar region without neurogenic claudication: Secondary | ICD-10-CM | POA: Diagnosis not present

## 2022-12-15 DIAGNOSIS — R1312 Dysphagia, oropharyngeal phase: Secondary | ICD-10-CM | POA: Diagnosis not present

## 2022-12-15 DIAGNOSIS — M48061 Spinal stenosis, lumbar region without neurogenic claudication: Secondary | ICD-10-CM | POA: Diagnosis not present

## 2022-12-15 DIAGNOSIS — M6281 Muscle weakness (generalized): Secondary | ICD-10-CM | POA: Diagnosis not present

## 2022-12-15 DIAGNOSIS — R2689 Other abnormalities of gait and mobility: Secondary | ICD-10-CM | POA: Diagnosis not present

## 2022-12-15 DIAGNOSIS — R41841 Cognitive communication deficit: Secondary | ICD-10-CM | POA: Diagnosis not present

## 2022-12-18 DIAGNOSIS — M48061 Spinal stenosis, lumbar region without neurogenic claudication: Secondary | ICD-10-CM | POA: Diagnosis not present

## 2022-12-18 DIAGNOSIS — M6281 Muscle weakness (generalized): Secondary | ICD-10-CM | POA: Diagnosis not present

## 2022-12-18 DIAGNOSIS — R41841 Cognitive communication deficit: Secondary | ICD-10-CM | POA: Diagnosis not present

## 2022-12-18 DIAGNOSIS — R1312 Dysphagia, oropharyngeal phase: Secondary | ICD-10-CM | POA: Diagnosis not present

## 2022-12-18 DIAGNOSIS — R2689 Other abnormalities of gait and mobility: Secondary | ICD-10-CM | POA: Diagnosis not present

## 2022-12-19 DIAGNOSIS — M48061 Spinal stenosis, lumbar region without neurogenic claudication: Secondary | ICD-10-CM | POA: Diagnosis not present

## 2022-12-19 DIAGNOSIS — R2689 Other abnormalities of gait and mobility: Secondary | ICD-10-CM | POA: Diagnosis not present

## 2022-12-19 DIAGNOSIS — R41841 Cognitive communication deficit: Secondary | ICD-10-CM | POA: Diagnosis not present

## 2022-12-19 DIAGNOSIS — F4321 Adjustment disorder with depressed mood: Secondary | ICD-10-CM | POA: Diagnosis not present

## 2022-12-19 DIAGNOSIS — R1312 Dysphagia, oropharyngeal phase: Secondary | ICD-10-CM | POA: Diagnosis not present

## 2022-12-19 DIAGNOSIS — M6281 Muscle weakness (generalized): Secondary | ICD-10-CM | POA: Diagnosis not present

## 2022-12-20 DIAGNOSIS — M48061 Spinal stenosis, lumbar region without neurogenic claudication: Secondary | ICD-10-CM | POA: Diagnosis not present

## 2022-12-20 DIAGNOSIS — R41841 Cognitive communication deficit: Secondary | ICD-10-CM | POA: Diagnosis not present

## 2022-12-20 DIAGNOSIS — M6281 Muscle weakness (generalized): Secondary | ICD-10-CM | POA: Diagnosis not present

## 2022-12-20 DIAGNOSIS — R2689 Other abnormalities of gait and mobility: Secondary | ICD-10-CM | POA: Diagnosis not present

## 2022-12-20 DIAGNOSIS — R1312 Dysphagia, oropharyngeal phase: Secondary | ICD-10-CM | POA: Diagnosis not present

## 2022-12-25 DIAGNOSIS — R1312 Dysphagia, oropharyngeal phase: Secondary | ICD-10-CM | POA: Diagnosis not present

## 2022-12-25 DIAGNOSIS — M48061 Spinal stenosis, lumbar region without neurogenic claudication: Secondary | ICD-10-CM | POA: Diagnosis not present

## 2022-12-25 DIAGNOSIS — R2689 Other abnormalities of gait and mobility: Secondary | ICD-10-CM | POA: Diagnosis not present

## 2022-12-25 DIAGNOSIS — M6281 Muscle weakness (generalized): Secondary | ICD-10-CM | POA: Diagnosis not present

## 2022-12-25 DIAGNOSIS — R41841 Cognitive communication deficit: Secondary | ICD-10-CM | POA: Diagnosis not present

## 2022-12-26 DIAGNOSIS — F5101 Primary insomnia: Secondary | ICD-10-CM | POA: Diagnosis not present

## 2022-12-26 DIAGNOSIS — R1312 Dysphagia, oropharyngeal phase: Secondary | ICD-10-CM | POA: Diagnosis not present

## 2022-12-26 DIAGNOSIS — R41841 Cognitive communication deficit: Secondary | ICD-10-CM | POA: Diagnosis not present

## 2022-12-26 DIAGNOSIS — M6281 Muscle weakness (generalized): Secondary | ICD-10-CM | POA: Diagnosis not present

## 2022-12-26 DIAGNOSIS — R2689 Other abnormalities of gait and mobility: Secondary | ICD-10-CM | POA: Diagnosis not present

## 2022-12-26 DIAGNOSIS — M48061 Spinal stenosis, lumbar region without neurogenic claudication: Secondary | ICD-10-CM | POA: Diagnosis not present

## 2022-12-26 DIAGNOSIS — F4321 Adjustment disorder with depressed mood: Secondary | ICD-10-CM | POA: Diagnosis not present

## 2022-12-27 DIAGNOSIS — R41841 Cognitive communication deficit: Secondary | ICD-10-CM | POA: Diagnosis not present

## 2022-12-27 DIAGNOSIS — M6281 Muscle weakness (generalized): Secondary | ICD-10-CM | POA: Diagnosis not present

## 2022-12-27 DIAGNOSIS — Z8673 Personal history of transient ischemic attack (TIA), and cerebral infarction without residual deficits: Secondary | ICD-10-CM | POA: Diagnosis not present

## 2022-12-27 DIAGNOSIS — I482 Chronic atrial fibrillation, unspecified: Secondary | ICD-10-CM | POA: Diagnosis not present

## 2022-12-27 DIAGNOSIS — I10 Essential (primary) hypertension: Secondary | ICD-10-CM | POA: Diagnosis not present

## 2022-12-27 DIAGNOSIS — M48061 Spinal stenosis, lumbar region without neurogenic claudication: Secondary | ICD-10-CM | POA: Diagnosis not present

## 2022-12-27 DIAGNOSIS — E1169 Type 2 diabetes mellitus with other specified complication: Secondary | ICD-10-CM | POA: Diagnosis not present

## 2022-12-27 DIAGNOSIS — R0989 Other specified symptoms and signs involving the circulatory and respiratory systems: Secondary | ICD-10-CM | POA: Diagnosis not present

## 2022-12-27 DIAGNOSIS — R1312 Dysphagia, oropharyngeal phase: Secondary | ICD-10-CM | POA: Diagnosis not present

## 2022-12-27 DIAGNOSIS — R2689 Other abnormalities of gait and mobility: Secondary | ICD-10-CM | POA: Diagnosis not present

## 2022-12-27 DIAGNOSIS — Z87440 Personal history of urinary (tract) infections: Secondary | ICD-10-CM | POA: Diagnosis not present

## 2022-12-27 DIAGNOSIS — Z6841 Body Mass Index (BMI) 40.0 and over, adult: Secondary | ICD-10-CM | POA: Diagnosis not present

## 2022-12-28 DIAGNOSIS — M6281 Muscle weakness (generalized): Secondary | ICD-10-CM | POA: Diagnosis not present

## 2022-12-28 DIAGNOSIS — R41841 Cognitive communication deficit: Secondary | ICD-10-CM | POA: Diagnosis not present

## 2022-12-28 DIAGNOSIS — R1312 Dysphagia, oropharyngeal phase: Secondary | ICD-10-CM | POA: Diagnosis not present

## 2022-12-28 DIAGNOSIS — M48061 Spinal stenosis, lumbar region without neurogenic claudication: Secondary | ICD-10-CM | POA: Diagnosis not present

## 2022-12-28 DIAGNOSIS — R2689 Other abnormalities of gait and mobility: Secondary | ICD-10-CM | POA: Diagnosis not present

## 2022-12-29 DIAGNOSIS — M6281 Muscle weakness (generalized): Secondary | ICD-10-CM | POA: Diagnosis not present

## 2022-12-29 DIAGNOSIS — R2689 Other abnormalities of gait and mobility: Secondary | ICD-10-CM | POA: Diagnosis not present

## 2022-12-29 DIAGNOSIS — M48061 Spinal stenosis, lumbar region without neurogenic claudication: Secondary | ICD-10-CM | POA: Diagnosis not present

## 2022-12-29 DIAGNOSIS — R41841 Cognitive communication deficit: Secondary | ICD-10-CM | POA: Diagnosis not present

## 2022-12-29 DIAGNOSIS — R1312 Dysphagia, oropharyngeal phase: Secondary | ICD-10-CM | POA: Diagnosis not present

## 2023-01-01 DIAGNOSIS — M48061 Spinal stenosis, lumbar region without neurogenic claudication: Secondary | ICD-10-CM | POA: Diagnosis not present

## 2023-01-01 DIAGNOSIS — M6281 Muscle weakness (generalized): Secondary | ICD-10-CM | POA: Diagnosis not present

## 2023-01-01 DIAGNOSIS — R1312 Dysphagia, oropharyngeal phase: Secondary | ICD-10-CM | POA: Diagnosis not present

## 2023-01-01 DIAGNOSIS — R2689 Other abnormalities of gait and mobility: Secondary | ICD-10-CM | POA: Diagnosis not present

## 2023-01-01 DIAGNOSIS — R41841 Cognitive communication deficit: Secondary | ICD-10-CM | POA: Diagnosis not present

## 2023-01-02 DIAGNOSIS — F4321 Adjustment disorder with depressed mood: Secondary | ICD-10-CM | POA: Diagnosis not present

## 2023-01-03 DIAGNOSIS — M6281 Muscle weakness (generalized): Secondary | ICD-10-CM | POA: Diagnosis not present

## 2023-01-03 DIAGNOSIS — R2689 Other abnormalities of gait and mobility: Secondary | ICD-10-CM | POA: Diagnosis not present

## 2023-01-03 DIAGNOSIS — R41841 Cognitive communication deficit: Secondary | ICD-10-CM | POA: Diagnosis not present

## 2023-01-03 DIAGNOSIS — M48061 Spinal stenosis, lumbar region without neurogenic claudication: Secondary | ICD-10-CM | POA: Diagnosis not present

## 2023-01-03 DIAGNOSIS — R1312 Dysphagia, oropharyngeal phase: Secondary | ICD-10-CM | POA: Diagnosis not present

## 2023-01-04 DIAGNOSIS — R1312 Dysphagia, oropharyngeal phase: Secondary | ICD-10-CM | POA: Diagnosis not present

## 2023-01-04 DIAGNOSIS — M48061 Spinal stenosis, lumbar region without neurogenic claudication: Secondary | ICD-10-CM | POA: Diagnosis not present

## 2023-01-04 DIAGNOSIS — M6281 Muscle weakness (generalized): Secondary | ICD-10-CM | POA: Diagnosis not present

## 2023-01-04 DIAGNOSIS — R41841 Cognitive communication deficit: Secondary | ICD-10-CM | POA: Diagnosis not present

## 2023-01-04 DIAGNOSIS — R2689 Other abnormalities of gait and mobility: Secondary | ICD-10-CM | POA: Diagnosis not present

## 2023-01-05 DIAGNOSIS — M6281 Muscle weakness (generalized): Secondary | ICD-10-CM | POA: Diagnosis not present

## 2023-01-05 DIAGNOSIS — R41841 Cognitive communication deficit: Secondary | ICD-10-CM | POA: Diagnosis not present

## 2023-01-05 DIAGNOSIS — R1312 Dysphagia, oropharyngeal phase: Secondary | ICD-10-CM | POA: Diagnosis not present

## 2023-01-05 DIAGNOSIS — M48061 Spinal stenosis, lumbar region without neurogenic claudication: Secondary | ICD-10-CM | POA: Diagnosis not present

## 2023-01-05 DIAGNOSIS — R2689 Other abnormalities of gait and mobility: Secondary | ICD-10-CM | POA: Diagnosis not present

## 2023-01-08 DIAGNOSIS — M6281 Muscle weakness (generalized): Secondary | ICD-10-CM | POA: Diagnosis not present

## 2023-01-08 DIAGNOSIS — M48061 Spinal stenosis, lumbar region without neurogenic claudication: Secondary | ICD-10-CM | POA: Diagnosis not present

## 2023-01-08 DIAGNOSIS — R41841 Cognitive communication deficit: Secondary | ICD-10-CM | POA: Diagnosis not present

## 2023-01-08 DIAGNOSIS — R2689 Other abnormalities of gait and mobility: Secondary | ICD-10-CM | POA: Diagnosis not present

## 2023-01-08 DIAGNOSIS — R1312 Dysphagia, oropharyngeal phase: Secondary | ICD-10-CM | POA: Diagnosis not present

## 2023-01-10 DIAGNOSIS — R2689 Other abnormalities of gait and mobility: Secondary | ICD-10-CM | POA: Diagnosis not present

## 2023-01-10 DIAGNOSIS — R1312 Dysphagia, oropharyngeal phase: Secondary | ICD-10-CM | POA: Diagnosis not present

## 2023-01-10 DIAGNOSIS — M48061 Spinal stenosis, lumbar region without neurogenic claudication: Secondary | ICD-10-CM | POA: Diagnosis not present

## 2023-01-10 DIAGNOSIS — M6281 Muscle weakness (generalized): Secondary | ICD-10-CM | POA: Diagnosis not present

## 2023-01-10 DIAGNOSIS — R41841 Cognitive communication deficit: Secondary | ICD-10-CM | POA: Diagnosis not present

## 2023-01-11 DIAGNOSIS — M6281 Muscle weakness (generalized): Secondary | ICD-10-CM | POA: Diagnosis not present

## 2023-01-11 DIAGNOSIS — R1312 Dysphagia, oropharyngeal phase: Secondary | ICD-10-CM | POA: Diagnosis not present

## 2023-01-11 DIAGNOSIS — R2689 Other abnormalities of gait and mobility: Secondary | ICD-10-CM | POA: Diagnosis not present

## 2023-01-11 DIAGNOSIS — M48061 Spinal stenosis, lumbar region without neurogenic claudication: Secondary | ICD-10-CM | POA: Diagnosis not present

## 2023-01-11 DIAGNOSIS — R41841 Cognitive communication deficit: Secondary | ICD-10-CM | POA: Diagnosis not present

## 2023-01-12 DIAGNOSIS — R1312 Dysphagia, oropharyngeal phase: Secondary | ICD-10-CM | POA: Diagnosis not present

## 2023-01-12 DIAGNOSIS — M48061 Spinal stenosis, lumbar region without neurogenic claudication: Secondary | ICD-10-CM | POA: Diagnosis not present

## 2023-01-12 DIAGNOSIS — R2689 Other abnormalities of gait and mobility: Secondary | ICD-10-CM | POA: Diagnosis not present

## 2023-01-12 DIAGNOSIS — R41841 Cognitive communication deficit: Secondary | ICD-10-CM | POA: Diagnosis not present

## 2023-01-12 DIAGNOSIS — M6281 Muscle weakness (generalized): Secondary | ICD-10-CM | POA: Diagnosis not present

## 2023-01-15 DIAGNOSIS — R41841 Cognitive communication deficit: Secondary | ICD-10-CM | POA: Diagnosis not present

## 2023-01-15 DIAGNOSIS — M6281 Muscle weakness (generalized): Secondary | ICD-10-CM | POA: Diagnosis not present

## 2023-01-15 DIAGNOSIS — M48061 Spinal stenosis, lumbar region without neurogenic claudication: Secondary | ICD-10-CM | POA: Diagnosis not present

## 2023-01-15 DIAGNOSIS — R1312 Dysphagia, oropharyngeal phase: Secondary | ICD-10-CM | POA: Diagnosis not present

## 2023-01-15 DIAGNOSIS — R2689 Other abnormalities of gait and mobility: Secondary | ICD-10-CM | POA: Diagnosis not present

## 2023-01-16 DIAGNOSIS — M6281 Muscle weakness (generalized): Secondary | ICD-10-CM | POA: Diagnosis not present

## 2023-01-16 DIAGNOSIS — R2689 Other abnormalities of gait and mobility: Secondary | ICD-10-CM | POA: Diagnosis not present

## 2023-01-16 DIAGNOSIS — F4321 Adjustment disorder with depressed mood: Secondary | ICD-10-CM | POA: Diagnosis not present

## 2023-01-16 DIAGNOSIS — R41841 Cognitive communication deficit: Secondary | ICD-10-CM | POA: Diagnosis not present

## 2023-01-16 DIAGNOSIS — R1312 Dysphagia, oropharyngeal phase: Secondary | ICD-10-CM | POA: Diagnosis not present

## 2023-01-16 DIAGNOSIS — M48061 Spinal stenosis, lumbar region without neurogenic claudication: Secondary | ICD-10-CM | POA: Diagnosis not present

## 2023-01-17 DIAGNOSIS — R41841 Cognitive communication deficit: Secondary | ICD-10-CM | POA: Diagnosis not present

## 2023-01-17 DIAGNOSIS — R2689 Other abnormalities of gait and mobility: Secondary | ICD-10-CM | POA: Diagnosis not present

## 2023-01-17 DIAGNOSIS — M48061 Spinal stenosis, lumbar region without neurogenic claudication: Secondary | ICD-10-CM | POA: Diagnosis not present

## 2023-01-17 DIAGNOSIS — M6281 Muscle weakness (generalized): Secondary | ICD-10-CM | POA: Diagnosis not present

## 2023-01-17 DIAGNOSIS — R1312 Dysphagia, oropharyngeal phase: Secondary | ICD-10-CM | POA: Diagnosis not present

## 2023-01-18 DIAGNOSIS — R2689 Other abnormalities of gait and mobility: Secondary | ICD-10-CM | POA: Diagnosis not present

## 2023-01-18 DIAGNOSIS — R1312 Dysphagia, oropharyngeal phase: Secondary | ICD-10-CM | POA: Diagnosis not present

## 2023-01-18 DIAGNOSIS — M48061 Spinal stenosis, lumbar region without neurogenic claudication: Secondary | ICD-10-CM | POA: Diagnosis not present

## 2023-01-18 DIAGNOSIS — R41841 Cognitive communication deficit: Secondary | ICD-10-CM | POA: Diagnosis not present

## 2023-01-18 DIAGNOSIS — M6281 Muscle weakness (generalized): Secondary | ICD-10-CM | POA: Diagnosis not present

## 2023-01-19 DIAGNOSIS — M6281 Muscle weakness (generalized): Secondary | ICD-10-CM | POA: Diagnosis not present

## 2023-01-19 DIAGNOSIS — R1312 Dysphagia, oropharyngeal phase: Secondary | ICD-10-CM | POA: Diagnosis not present

## 2023-01-19 DIAGNOSIS — R41841 Cognitive communication deficit: Secondary | ICD-10-CM | POA: Diagnosis not present

## 2023-01-19 DIAGNOSIS — M48061 Spinal stenosis, lumbar region without neurogenic claudication: Secondary | ICD-10-CM | POA: Diagnosis not present

## 2023-01-19 DIAGNOSIS — R2689 Other abnormalities of gait and mobility: Secondary | ICD-10-CM | POA: Diagnosis not present

## 2023-01-21 DIAGNOSIS — R1312 Dysphagia, oropharyngeal phase: Secondary | ICD-10-CM | POA: Diagnosis not present

## 2023-01-21 DIAGNOSIS — M48061 Spinal stenosis, lumbar region without neurogenic claudication: Secondary | ICD-10-CM | POA: Diagnosis not present

## 2023-01-21 DIAGNOSIS — R2689 Other abnormalities of gait and mobility: Secondary | ICD-10-CM | POA: Diagnosis not present

## 2023-01-21 DIAGNOSIS — M6281 Muscle weakness (generalized): Secondary | ICD-10-CM | POA: Diagnosis not present

## 2023-01-21 DIAGNOSIS — R41841 Cognitive communication deficit: Secondary | ICD-10-CM | POA: Diagnosis not present

## 2023-01-22 DIAGNOSIS — R2689 Other abnormalities of gait and mobility: Secondary | ICD-10-CM | POA: Diagnosis not present

## 2023-01-22 DIAGNOSIS — M6281 Muscle weakness (generalized): Secondary | ICD-10-CM | POA: Diagnosis not present

## 2023-01-22 DIAGNOSIS — M48061 Spinal stenosis, lumbar region without neurogenic claudication: Secondary | ICD-10-CM | POA: Diagnosis not present

## 2023-01-22 DIAGNOSIS — R1312 Dysphagia, oropharyngeal phase: Secondary | ICD-10-CM | POA: Diagnosis not present

## 2023-01-22 DIAGNOSIS — R41841 Cognitive communication deficit: Secondary | ICD-10-CM | POA: Diagnosis not present

## 2023-01-23 DIAGNOSIS — R41841 Cognitive communication deficit: Secondary | ICD-10-CM | POA: Diagnosis not present

## 2023-01-23 DIAGNOSIS — M48061 Spinal stenosis, lumbar region without neurogenic claudication: Secondary | ICD-10-CM | POA: Diagnosis not present

## 2023-01-23 DIAGNOSIS — M6281 Muscle weakness (generalized): Secondary | ICD-10-CM | POA: Diagnosis not present

## 2023-01-23 DIAGNOSIS — R2689 Other abnormalities of gait and mobility: Secondary | ICD-10-CM | POA: Diagnosis not present

## 2023-01-23 DIAGNOSIS — R1312 Dysphagia, oropharyngeal phase: Secondary | ICD-10-CM | POA: Diagnosis not present

## 2023-01-24 DIAGNOSIS — M48061 Spinal stenosis, lumbar region without neurogenic claudication: Secondary | ICD-10-CM | POA: Diagnosis not present

## 2023-01-24 DIAGNOSIS — M6281 Muscle weakness (generalized): Secondary | ICD-10-CM | POA: Diagnosis not present

## 2023-01-24 DIAGNOSIS — R1312 Dysphagia, oropharyngeal phase: Secondary | ICD-10-CM | POA: Diagnosis not present

## 2023-01-24 DIAGNOSIS — R2689 Other abnormalities of gait and mobility: Secondary | ICD-10-CM | POA: Diagnosis not present

## 2023-01-24 DIAGNOSIS — R41841 Cognitive communication deficit: Secondary | ICD-10-CM | POA: Diagnosis not present

## 2023-01-25 DIAGNOSIS — R2689 Other abnormalities of gait and mobility: Secondary | ICD-10-CM | POA: Diagnosis not present

## 2023-01-25 DIAGNOSIS — M48061 Spinal stenosis, lumbar region without neurogenic claudication: Secondary | ICD-10-CM | POA: Diagnosis not present

## 2023-01-25 DIAGNOSIS — R41841 Cognitive communication deficit: Secondary | ICD-10-CM | POA: Diagnosis not present

## 2023-01-25 DIAGNOSIS — R1312 Dysphagia, oropharyngeal phase: Secondary | ICD-10-CM | POA: Diagnosis not present

## 2023-01-25 DIAGNOSIS — M6281 Muscle weakness (generalized): Secondary | ICD-10-CM | POA: Diagnosis not present

## 2023-01-26 DIAGNOSIS — R1312 Dysphagia, oropharyngeal phase: Secondary | ICD-10-CM | POA: Diagnosis not present

## 2023-01-26 DIAGNOSIS — R2689 Other abnormalities of gait and mobility: Secondary | ICD-10-CM | POA: Diagnosis not present

## 2023-01-26 DIAGNOSIS — M48061 Spinal stenosis, lumbar region without neurogenic claudication: Secondary | ICD-10-CM | POA: Diagnosis not present

## 2023-01-26 DIAGNOSIS — Z7189 Other specified counseling: Secondary | ICD-10-CM | POA: Diagnosis not present

## 2023-01-26 DIAGNOSIS — S61305A Unspecified open wound of left ring finger with damage to nail, initial encounter: Secondary | ICD-10-CM | POA: Diagnosis not present

## 2023-01-26 DIAGNOSIS — M5416 Radiculopathy, lumbar region: Secondary | ICD-10-CM | POA: Diagnosis not present

## 2023-01-26 DIAGNOSIS — M6281 Muscle weakness (generalized): Secondary | ICD-10-CM | POA: Diagnosis not present

## 2023-01-26 DIAGNOSIS — S61102A Unspecified open wound of left thumb with damage to nail, initial encounter: Secondary | ICD-10-CM | POA: Diagnosis not present

## 2023-01-26 DIAGNOSIS — I69319 Unspecified symptoms and signs involving cognitive functions following cerebral infarction: Secondary | ICD-10-CM | POA: Diagnosis not present

## 2023-01-26 DIAGNOSIS — Z7901 Long term (current) use of anticoagulants: Secondary | ICD-10-CM | POA: Diagnosis not present

## 2023-01-26 DIAGNOSIS — R41841 Cognitive communication deficit: Secondary | ICD-10-CM | POA: Diagnosis not present

## 2023-03-21 ENCOUNTER — Encounter (HOSPITAL_COMMUNITY): Payer: Self-pay | Admitting: Emergency Medicine

## 2023-03-21 ENCOUNTER — Other Ambulatory Visit: Payer: Self-pay

## 2023-03-21 ENCOUNTER — Inpatient Hospital Stay (HOSPITAL_COMMUNITY)
Admission: EM | Admit: 2023-03-21 | Discharge: 2023-03-26 | DRG: 641 | Disposition: A | Discharge status: Skilled Nursing Facility | Payer: Medicare PPO | Source: Skilled Nursing Facility | Attending: Family Medicine | Admitting: Family Medicine

## 2023-03-21 DIAGNOSIS — E875 Hyperkalemia: Principal | ICD-10-CM

## 2023-03-21 DIAGNOSIS — Z79899 Other long term (current) drug therapy: Secondary | ICD-10-CM

## 2023-03-21 DIAGNOSIS — Z8616 Personal history of COVID-19: Secondary | ICD-10-CM

## 2023-03-21 DIAGNOSIS — Z8249 Family history of ischemic heart disease and other diseases of the circulatory system: Secondary | ICD-10-CM

## 2023-03-21 DIAGNOSIS — T500X5A Adverse effect of mineralocorticoids and their antagonists, initial encounter: Secondary | ICD-10-CM | POA: Diagnosis present

## 2023-03-21 DIAGNOSIS — I482 Chronic atrial fibrillation, unspecified: Secondary | ICD-10-CM | POA: Diagnosis present

## 2023-03-21 DIAGNOSIS — Z7901 Long term (current) use of anticoagulants: Secondary | ICD-10-CM

## 2023-03-21 DIAGNOSIS — Z6841 Body Mass Index (BMI) 40.0 and over, adult: Secondary | ICD-10-CM

## 2023-03-21 DIAGNOSIS — E669 Obesity, unspecified: Secondary | ICD-10-CM | POA: Diagnosis present

## 2023-03-21 DIAGNOSIS — K5900 Constipation, unspecified: Secondary | ICD-10-CM | POA: Diagnosis present

## 2023-03-21 DIAGNOSIS — Z7984 Long term (current) use of oral hypoglycemic drugs: Secondary | ICD-10-CM

## 2023-03-21 DIAGNOSIS — L89899 Pressure ulcer of other site, unspecified stage: Principal | ICD-10-CM

## 2023-03-21 DIAGNOSIS — N1831 Chronic kidney disease, stage 3a: Secondary | ICD-10-CM | POA: Diagnosis present

## 2023-03-21 DIAGNOSIS — Z86711 Personal history of pulmonary embolism: Secondary | ICD-10-CM

## 2023-03-21 DIAGNOSIS — I129 Hypertensive chronic kidney disease with stage 1 through stage 4 chronic kidney disease, or unspecified chronic kidney disease: Secondary | ICD-10-CM | POA: Diagnosis present

## 2023-03-21 DIAGNOSIS — E785 Hyperlipidemia, unspecified: Secondary | ICD-10-CM | POA: Diagnosis present

## 2023-03-21 DIAGNOSIS — E1122 Type 2 diabetes mellitus with diabetic chronic kidney disease: Secondary | ICD-10-CM | POA: Diagnosis present

## 2023-03-21 DIAGNOSIS — J45909 Unspecified asthma, uncomplicated: Secondary | ICD-10-CM | POA: Diagnosis present

## 2023-03-21 LAB — CBC
HCT: 31.8 % — ABNORMAL LOW (ref 36.0–46.0)
Hemoglobin: 9.6 g/dL — ABNORMAL LOW (ref 12.0–15.0)
MCH: 26.9 pg (ref 26.0–34.0)
MCHC: 30.2 g/dL (ref 30.0–36.0)
MCV: 89.1 fL (ref 80.0–100.0)
Platelets: 224 10*3/uL (ref 150–400)
RBC: 3.57 MIL/uL — ABNORMAL LOW (ref 3.87–5.11)
RDW: 14.6 % (ref 11.5–15.5)
WBC: 5.4 10*3/uL (ref 4.0–10.5)
nRBC: 0 % (ref 0.0–0.2)

## 2023-03-21 LAB — CBG MONITORING, ED
Glucose-Capillary: 85 mg/dL (ref 70–99)
Glucose-Capillary: 138 mg/dL — ABNORMAL HIGH (ref 70–99)

## 2023-03-21 LAB — HEMOGLOBIN A1C
Hgb A1c MFr Bld: 7 % — ABNORMAL HIGH (ref 4.8–5.6)
Mean Plasma Glucose: 154.2 mg/dL

## 2023-03-21 LAB — BASIC METABOLIC PANEL
Anion gap: 7 (ref 5–15)
BUN: 39 mg/dL — ABNORMAL HIGH (ref 8–23)
CO2: 21 mmol/L — ABNORMAL LOW (ref 22–32)
Calcium: 8.7 mg/dL — ABNORMAL LOW (ref 8.9–10.3)
Chloride: 108 mmol/L (ref 98–111)
Creatinine, Ser: 1.17 mg/dL — ABNORMAL HIGH (ref 0.44–1.00)
GFR, Estimated: 46 mL/min — ABNORMAL LOW (ref >60)
Glucose, Bld: 103 mg/dL — ABNORMAL HIGH (ref 70–99)
Potassium: 6.4 mmol/L (ref 3.5–5.1)
Sodium: 136 mmol/L (ref 135–145)

## 2023-03-21 LAB — GLUCOSE, CAPILLARY: Glucose-Capillary: 110 mg/dL — ABNORMAL HIGH (ref 70–99)

## 2023-03-21 MED ORDER — MONTELUKAST SODIUM 10 MG PO TABS
10.0000 mg | ORAL_TABLET | Freq: Every day | ORAL | Status: DC
  Administered 2023-03-22 – 2023-03-25 (×4): 10 mg via ORAL
  Filled 2023-03-21 (×4): qty 1

## 2023-03-21 MED ORDER — SODIUM CHLORIDE 0.9 % IV BOLUS
500.0000 mL | Freq: Once | INTRAVENOUS | Status: AC
  Administered 2023-03-21: 500 mL via INTRAVENOUS

## 2023-03-21 MED ORDER — CALCIUM GLUCONATE-NACL 1-0.675 GM/50ML-% IV SOLN
1.0000 g | Freq: Once | INTRAVENOUS | Status: AC
  Administered 2023-03-21: 1000 mg via INTRAVENOUS
  Filled 2023-03-21: qty 50

## 2023-03-21 MED ORDER — ACETAMINOPHEN 650 MG RE SUPP: 650.0000 mg | Freq: Four times a day (QID) | RECTAL | Status: DC | PRN

## 2023-03-21 MED ORDER — FUROSEMIDE 10 MG/ML IJ SOLN
40.0000 mg | Freq: Once | INTRAMUSCULAR | Status: AC
  Administered 2023-03-21: 40 mg via INTRAVENOUS
  Filled 2023-03-21: qty 4

## 2023-03-21 MED ORDER — TRAZODONE HCL 50 MG PO TABS: 25.0000 mg | ORAL_TABLET | Freq: Every evening | ORAL | Status: DC | PRN

## 2023-03-21 MED ORDER — APIXABAN 5 MG PO TABS
5.0000 mg | ORAL_TABLET | Freq: Two times a day (BID) | ORAL | Status: DC
  Administered 2023-03-21 – 2023-03-26 (×10): 5 mg via ORAL
  Filled 2023-03-21 (×10): qty 1

## 2023-03-21 MED ORDER — FLUTICASONE PROPIONATE 50 MCG/ACT NA SUSP
2.0000 | Freq: Every day | NASAL | Status: DC
  Administered 2023-03-22 – 2023-03-25 (×4): 2 via NASAL
  Filled 2023-03-21: qty 16

## 2023-03-21 MED ORDER — ONDANSETRON HCL 4 MG PO TABS: 4.0000 mg | ORAL_TABLET | Freq: Four times a day (QID) | ORAL | Status: DC | PRN

## 2023-03-21 MED ORDER — EZETIMIBE 10 MG PO TABS
10.0000 mg | ORAL_TABLET | Freq: Every day | ORAL | Status: DC
  Administered 2023-03-21 – 2023-03-25 (×5): 10 mg via ORAL
  Filled 2023-03-21 (×6): qty 1

## 2023-03-21 MED ORDER — LORATADINE 10 MG PO TABS
10.0000 mg | ORAL_TABLET | Freq: Every day | ORAL | Status: DC
  Administered 2023-03-22 – 2023-03-26 (×5): 10 mg via ORAL
  Filled 2023-03-21 (×5): qty 1

## 2023-03-21 MED ORDER — DOCUSATE SODIUM 100 MG PO CAPS
100.0000 mg | ORAL_CAPSULE | Freq: Every day | ORAL | Status: DC
  Administered 2023-03-22 – 2023-03-26 (×5): 100 mg via ORAL
  Filled 2023-03-21 (×5): qty 1

## 2023-03-21 MED ORDER — POLYETHYLENE GLYCOL 3350 17 G PO PACK
17.0000 g | PACK | Freq: Every day | ORAL | Status: DC | PRN
  Administered 2023-03-24: 17 g via ORAL

## 2023-03-21 MED ORDER — ATORVASTATIN CALCIUM 40 MG PO TABS
40.0000 mg | ORAL_TABLET | Freq: Every day | ORAL | Status: DC
  Administered 2023-03-21 – 2023-03-25 (×5): 40 mg via ORAL
  Filled 2023-03-21 (×5): qty 1

## 2023-03-21 MED ORDER — INSULIN ASPART 100 UNIT/ML IV SOLN
5.0000 [IU] | Freq: Once | INTRAVENOUS | Status: AC
  Administered 2023-03-21: 5 [IU] via INTRAVENOUS
  Filled 2023-03-21: qty 0.05

## 2023-03-21 MED ORDER — LOSARTAN POTASSIUM 50 MG PO TABS
50.0000 mg | ORAL_TABLET | Freq: Every day | ORAL | Status: DC
  Administered 2023-03-22: 50 mg via ORAL
  Filled 2023-03-21: qty 1

## 2023-03-21 MED ORDER — POLYVINYL ALCOHOL 1.4 % OP SOLN
1.0000 [drp] | Freq: Every day | OPHTHALMIC | Status: DC
  Administered 2023-03-21 – 2023-03-25 (×5): 1 [drp] via OPHTHALMIC
  Filled 2023-03-21: qty 15

## 2023-03-21 MED ORDER — ACETAMINOPHEN 325 MG PO TABS: 650.0000 mg | ORAL_TABLET | Freq: Four times a day (QID) | ORAL | Status: DC | PRN

## 2023-03-21 MED ORDER — ARTIFICIAL TEARS OPHTHALMIC OINT
1.0000 | TOPICAL_OINTMENT | Freq: Every day | OPHTHALMIC | Status: DC
  Filled 2023-03-21: qty 3.5

## 2023-03-21 MED ORDER — INSULIN ASPART 100 UNIT/ML IJ SOLN
0.0000 [IU] | Freq: Three times a day (TID) | INTRAMUSCULAR | Status: DC
  Administered 2023-03-21 – 2023-03-24 (×2): 2 [IU] via SUBCUTANEOUS
  Administered 2023-03-25 (×2): 5 [IU] via SUBCUTANEOUS
  Filled 2023-03-21: qty 0.15

## 2023-03-21 MED ORDER — DEXTROSE 50 % IV SOLN
1.0000 | Freq: Once | INTRAVENOUS | Status: AC
  Administered 2023-03-21: 50 mL via INTRAVENOUS
  Filled 2023-03-21: qty 50

## 2023-03-21 MED ORDER — ONDANSETRON HCL 4 MG/2ML IJ SOLN: 4.0000 mg | Freq: Four times a day (QID) | INTRAMUSCULAR | Status: DC | PRN

## 2023-03-21 MED ORDER — ALBUTEROL SULFATE (2.5 MG/3ML) 0.083% IN NEBU: 2.5000 mg | INHALATION_SOLUTION | RESPIRATORY_TRACT | Status: DC | PRN

## 2023-03-21 MED ORDER — INSULIN ASPART 100 UNIT/ML IJ SOLN
0.0000 [IU] | Freq: Every day | INTRAMUSCULAR | Status: DC
  Filled 2023-03-21: qty 0.05

## 2023-03-21 MED ORDER — FERROUS SULFATE 325 (65 FE) MG PO TABS
325.0000 mg | ORAL_TABLET | ORAL | Status: DC
  Administered 2023-03-22 – 2023-03-26 (×3): 325 mg via ORAL
  Filled 2023-03-21 (×3): qty 1

## 2023-03-21 MED ORDER — CARVEDILOL 12.5 MG PO TABS
12.5000 mg | ORAL_TABLET | Freq: Two times a day (BID) | ORAL | Status: DC
  Administered 2023-03-21 – 2023-03-26 (×10): 12.5 mg via ORAL
  Filled 2023-03-21 (×10): qty 1

## 2023-03-21 MED ORDER — AMLODIPINE BESYLATE 10 MG PO TABS
10.0000 mg | ORAL_TABLET | Freq: Every day | ORAL | Status: DC
  Administered 2023-03-22 – 2023-03-26 (×5): 10 mg via ORAL
  Filled 2023-03-21 (×5): qty 1

## 2023-03-21 NOTE — ED Triage Notes (Signed)
Patient presents from [facility] with a potassium level of 6.2. She also complains of a slight headache and cough which she has had and is being treated for at the facility for a while per EMS.    EMS vitals: 120/80 BP 70 HR Regular

## 2023-03-21 NOTE — ED Provider Notes (Signed)
[place] EMERGENCY DEPARTMENT AT [facility] Provider Note   CSN: [id number] Arrival date & time: 03/21/23  1245     History  Chief Complaint  Patient presents with  . Abnormal Lab    Kayla [name] is a 82 y.o. female.  82 year old female presented from skilled nursing facility at her reported baseline per EMS with abnormal lab.  Reportedly had routine labs done which showed a potassium of 6.2.  Patient is asymptomatic currently.  Still making good urine.  Not having any chest pain or shortness of breath.        Home Medications Prior to Admission medications   Medication Sig Start Date End Date Taking? Authorizing Provider  acetaminophen (TYLENOL) 325 MG tablet Take 650 mg by mouth every 8 (eight) hours as needed (pain).   Yes [provider]  amLODipine (NORVASC) 10 MG tablet Take 10 mg by mouth daily.   Yes [provider]  apixaban (ELIQUIS) 5 MG TABS tablet Take 5 mg by mouth 2 (two) times daily.   Yes [provider]  atorvastatin (LIPITOR) 40 MG tablet Take 40 mg by mouth at bedtime.   Yes [provider]  Carboxymethylcellulose Sod PF (REFRESH CELLUVISC) 1 % GEL Place 1 drop into both eyes in the morning and at bedtime.   Yes [provider]  carvedilol (COREG) 12.5 MG tablet Take 12.5 mg by mouth 2 (two) times daily with a meal.   Yes [provider]  cetirizine (ZYRTEC) 10 MG tablet Take 10 mg by mouth daily.   Yes [provider]  Cranberry 450 MG TABS Take 450 mg by mouth daily.   Yes [provider]  docusate sodium (COLACE) 100 MG capsule Take 100 mg by mouth daily. Take with prune juice   Yes [provider]  ezetimibe (ZETIA) 10 MG tablet Take 10 mg by mouth at bedtime.   Yes [provider]  ferrous sulfate 325 (65 FE) MG tablet Take 325 mg by mouth every other day.   Yes [provider]  fluticasone (FLONASE) 50 MCG/ACT nasal spray Place 2 sprays  into both nostrils in the morning and at bedtime.   Yes [provider]  gabapentin (NEURONTIN) 100 MG capsule Take 1 capsule (100 mg total) by mouth daily. Patient taking differently: Take 100 mg by mouth 2 (two) times daily. 12/03/21  Yes Rhetta Mura, MD  losartan (COZAAR) 50 MG tablet Take 50 mg by mouth in the morning and at bedtime.   Yes [provider]  melatonin 5 MG TABS Take 5 mg by mouth at bedtime as needed (sleep).   Yes [provider]  Menthol, Topical Analgesic, (BIOFREEZE) 4 % GEL Apply 1 application  topically in the morning, at noon, and at bedtime. Apply thin layer topically to right shoulder and arm   Yes [provider]  metFORMIN (GLUCOPHAGE) 500 MG tablet Take 500 mg by mouth in the morning and at bedtime.   Yes [provider]  montelukast (SINGULAIR) 10 MG tablet Take 10 mg by mouth daily.   Yes [provider]  nitroGLYCERIN (NITROSTAT) 0.4 MG SL tablet Take up to 3 tablets Patient taking differently: 0.4 mg every 5 (five) minutes as needed for chest pain (max 3 tablets). 03/31/21  Yes Chandrasekhar, Mahesh A, MD  olopatadine (PATANOL) 0.1 % ophthalmic solution Place 2 drops into both eyes daily.   Yes [provider]  polyethylene glycol (MIRALAX / GLYCOLAX) 17 g packet Take  17 g by mouth daily as needed for mild constipation or moderate constipation.   Yes [provider]  polyethylene glycol (MIRALAX / GLYCOLAX) 17 g packet Take 17 g by mouth 2 (two) times a week. Monday and Thursday   Yes [provider]  spironolactone (ALDACTONE) 50 MG tablet Take 50 mg by mouth daily.   Yes [provider]      Allergies    Patient has no known allergies.    Review of Systems   Review of Systems  Physical Exam Updated Vital Signs BP (!) 140/61   Pulse 66   Temp 97.6 F (36.4 C) (Oral)   Resp 19   SpO2 99%  Physical Exam Vitals and nursing note reviewed.  HENT:     Head:  Normocephalic.     Mouth/Throat:     Mouth: Mucous membranes are moist.  Eyes:     Conjunctiva/sclera: Conjunctivae normal.  Cardiovascular:     Rate and Rhythm: Normal rate and regular rhythm.  Pulmonary:     Effort: Pulmonary effort is normal.  Abdominal:     General: Abdomen is flat. There is no distension.     Palpations: Abdomen is soft.     Tenderness: There is no abdominal tenderness. There is no guarding or rebound.  Musculoskeletal:     Right lower leg: Edema present.     Left lower leg: Edema present.  Skin:    General: Skin is warm.     Capillary Refill: Capillary refill takes less than 2 seconds.  Neurological:     Mental Status: She is alert.     Comments: No gross motor deficits  Psychiatric:        Mood and Affect: Mood normal.        Behavior: Behavior normal.     ED Results / Procedures / Treatments   Labs (all labs ordered are listed, but only abnormal results are displayed) Labs Reviewed  BASIC METABOLIC PANEL - Abnormal; Notable for the following components:      Result Value   Potassium 6.4 (*)    CO2 21 (*)    Glucose, Bld 103 (*)    BUN 39 (*)    Creatinine, Ser 1.17 (*)    Calcium 8.7 (*)    GFR, Estimated 46 (*)    All other components within normal limits  CBC - Abnormal; Notable for the following components:   RBC 3.57 (*)    Hemoglobin 9.6 (*)    HCT 31.8 (*)    All other components within normal limits  CBG MONITORING, ED    EKG EKG Interpretation Date/Time:  Wednesday March 21 2023 13:06:47 EDT Ventricular Rate:  63 PR Interval:  161 QRS Duration:  88 QT Interval:  410 QTC Calculation: 420 R Axis:   5  Text Interpretation: Sinus rhythm Abnormal R-wave progression, early transition Probable left ventricular hypertrophy Confirmed by Estanislado Pandy [phone]) on 03/21/2023 1:18:04 PM  Radiology No results found.  Procedures Procedures    Medications Ordered in ED Medications  calcium gluconate 1 g/ 50 mL sodium chloride  IVPB (has no administration in time range)  insulin aspart (novoLOG) injection 5 Units (5 Units Intravenous Given 03/21/23 1442)    And  dextrose 50 % solution 50 mL (50 mLs Intravenous Given 03/21/23 1443)  furosemide (LASIX) injection 40 mg (40 mg Intravenous Given 03/21/23 1443)  sodium chloride 0.9 % bolus 500 mL (500 mLs Intravenous New Bag/Given 03/21/23 1441)    ED Course/  Medical Decision Making/ A&P Clinical Course as of 03/21/23 1525  Wed Mar 21, 2023  1355 CBC(!) Appears similar to baseline. [TY]  1415 PMH per chart review: cystitis without hematuria, history of metabolic encephalopathy, osteoarthritis, and chronic atrial flutter/atrial fibrillation, COVID-19 infection, class II obesity, type II DM, hyperlipidemia, hypertension, facial droop, lumbar nerve impingement, pulmonary embolism [TY]  1417 Potassium(!!): 6.4 EKG appears similar to prior.  Will initiate medical management.  Will admit for hyperkalemia. [TY]  1418 Per chart review Echo 2022:"1. Left ventricular ejection fraction, by estimation, is 55 to 60%. The  left ventricle has normal function. The left ventricle has no regional  wall motion abnormalities. There is mild left ventricular hypertrophy.  Left ventricular diastolic parameters  are indeterminate.  " [TY]  3467 Is a 82 year old female present emergency department for hyperkalemia.  Repeat testing shows a potassium of 6.4.  EKG without significant changes compared to prior.  Kidney function appears to be at baseline.  She is on ARB, but no other overt cause for her hyperkalemia.  Given calcium, insulin, and Lasix and IV fluids.  Will admit for hyperkalemia [TY]  1524 Look with hospitalist who will see and admit patient for hyperkalemia. [TY]    Clinical Course User Index [TY] Coral Spikes, DO                                 Medical Decision Making Well-appearing 82 year old female presenting emergency department with reported potassium 6.2.  EMS reports  stable vital signs and the patient is at her neurologic baseline per facility reports.  Per chart review appears that labs drawn yesterday did show potassium 6.2, minor elevation in your creatinine, but appears similar to prior.  She is asymptomatic currently.  Will repeat basic labs.  Get EKG to evaluate for hyperkalemia.  Will treat as indicated.  See ED course for further MDM disposition.  Amount and/or Complexity of Data Reviewed Labs: ordered. Decision-making details documented in ED Course. ECG/medicine tests: ordered.  Risk OTC drugs. Prescription drug management. Decision regarding hospitalization.          Final Clinical Impression(s) / ED Diagnoses Final diagnoses:  Hyperkalemia    Rx / DC Orders ED Discharge Orders     None         Coral Spikes, DO 03/21/23 1448

## 2023-03-21 NOTE — ED Notes (Signed)
ED TO INPATIENT HANDOFF REPORT  Name/Age/Gender Kayla [name] 82 y.o. female  Code Status    Code Status Orders  (From admission, onward)           Start     Ordered   03/21/23 1527  Full code  Continuous       Question:  By:  Answer:  Consent: discussion documented in EHR   03/21/23 1528           Code Status History     Date Active Date Inactive Code Status Order ID Comments User Context   11/30/2021 1547 12/03/2021 2207 Full Code [id number]  Bobette Mo, MD ED   08/26/2021 0849 08/29/2021 2034 Full Code [id number]  Marinda Elk, MD ED   06/23/2021 0552 06/30/2021 2123 Full Code [id number]  Angie Fava, DO ED   03/05/2021 0555 03/15/2021 1954 Full Code [id number]  John Giovanni, MD ED       Home/SNF/Other Skilled nursing facility  Chief Complaint Hyperkalemia [E87.5]  Level of Care/Admitting Diagnosis ED Disposition     ED Disposition  Admit   Condition  --   Comment  Hospital Area: [facility] [[id number]]  Level of Care: Med-Surg [16]  May place patient in observation at [facility] or [facility] if equivalent level of care is available:: Yes  Covid Evaluation: Asymptomatic - no recent exposure (last 10 days) testing not required  Diagnosis: Hyperkalemia [244010]  Admitting Physician: Maryln Gottron [[id number]]  Attending Physician: Kirby Crigler, Parks [place]une [[id number]]          Medical History Past Medical History:  Diagnosis Date   Acute cystitis without hematuria 08/26/2021   Acute metabolic encephalopathy 08/26/2021   Arthritis    Atrial fibrillation, chronic (HCC)    Atrial flutter (HCC) 03/06/2021   COVID-19 virus infection 08/26/2021   Diabetes mellitus    Facial droop 08/26/2021   Hypertension    Lumbar nerve root impingement 06/24/2021   Obesity    Pulmonary embolus (HCC)    Type 2 diabetes mellitus (HCC) 03/05/2021    Allergies No Known Allergies  IV Location/Drains/Wounds Patient Lines/Drains/Airways  Status     Active Line/Drains/Airways     Name Placement date Placement time Site Days   Peripheral IV 03/21/23 20 G Right Antecubital 03/21/23  1411  Antecubital  less than 1   Pressure Injury 06/28/21 Heel Left Deep Tissue Pressure Injury - Purple or maroon localized area of discolored intact skin or blood-filled blister due to damage of underlying soft tissue from pressure and/or shear. 06/28/21  0930  -- 631   Pressure Injury 11/30/21 Heel Left Stage 2 -  Partial thickness loss of dermis presenting as a shallow open injury with a red, pink wound bed without slough. partially healed stg 2 PI 11/30/21  1814  -- 476   Pressure Injury 12/02/21 Sacrum Stage 2 -  Partial thickness loss of dermis presenting as a shallow open injury with a red, pink wound bed without slough. 12/02/21  1209  -- 474            Labs/Imaging Results for orders placed or performed during the hospital encounter of 03/21/23 (from the past 48 hour(s))  Basic metabolic panel     Status: Abnormal   Collection Time: 03/21/23  1:14 PM  Result Value Ref Range   Sodium 136 135 - 145 mmol/L   Potassium 6.4 (HH) 3.5 - 5.1 mmol/L    Comment: CRITICAL RESULT CALLED TO, READ BACK  BY AND VERIFIED WITH Laiken Nohr L. @ 1408 ON 03/21/23 CAL    Chloride 108 98 - 111 mmol/L   CO2 21 (L) 22 - 32 mmol/L   Glucose, Bld 103 (H) 70 - 99 mg/dL    Comment: Glucose reference range applies only to samples taken after fasting for at least 8 hours.   BUN 39 (H) 8 - 23 mg/dL   Creatinine, Ser 4.09 (H) 0.44 - 1.00 mg/dL   Calcium 8.7 (L) 8.9 - 10.3 mg/dL   GFR, Estimated 46 (L) >60 mL/min    Comment: (NOTE) Calculated using the CKD-EPI Creatinine Equation (2021)    Anion gap 7 5 - 15    Comment: Performed at [facility], [address] [place], [place] [address]  CBC     Status: Abnormal   Collection Time: 03/21/23  1:14 PM  Result Value Ref Range   WBC 5.4 4.0 - 10.5 K/uL   RBC 3.57 (L) 3.87 - 5.11 MIL/uL    Hemoglobin 9.6 (L) 12.0 - 15.0 g/dL   HCT 47.8 (L) 29.5 - 62.1 %   MCV 89.1 80.0 - 100.0 fL   MCH 26.9 26.0 - 34.0 pg   MCHC 30.2 30.0 - 36.0 g/dL   RDW 30.8 65.7 - 84.6 %   Platelets 224 150 - 400 K/uL   nRBC 0.0 0.0 - 0.2 %    Comment: Performed at [facility], [address] [place], [place] [address]  CBG monitoring, ED     Status: None   Collection Time: 03/21/23  2:39 PM  Result Value Ref Range   Glucose-Capillary 85 70 - 99 mg/dL    Comment: Glucose reference range applies only to samples taken after fasting for at least 8 hours.   No results found.  Pending Labs Unresulted Labs (From admission, onward)     Start     Ordered   03/22/23 0500  Basic metabolic panel  Tomorrow morning,   R        03/21/23 1528   03/22/23 0500  CBC  Tomorrow morning,   R        03/21/23 1528   03/21/23 1528  Hemoglobin A1c  (Glycemic Control (SSI)  Q 4 Hours / Glycemic Control (SSI)  AC +/- HS)  Once,   R       Comments: To assess prior glycemic control    03/21/23 1528            Vitals/Pain Today's Vitals   03/21/23 1415 03/21/23 1430 03/21/23 1600 03/21/23 1630  BP:  (!) 145/66 (!) 153/69 (!) 177/72  Pulse: 66 (!) 59 71 67  Resp: 19 18    Temp:      TempSrc:      SpO2: 99% 100% 99% 98%  PainSc:        Isolation Precautions No active isolations  Medications Medications  amLODipine (NORVASC) tablet 10 mg (has no administration in time range)  atorvastatin (LIPITOR) tablet 40 mg (has no administration in time range)  carvedilol (COREG) tablet 12.5 mg (has no administration in time range)  ezetimibe (ZETIA) tablet 10 mg (has no administration in time range)  losartan (COZAAR) tablet 50 mg (has no administration in time range)  docusate sodium (COLACE) capsule 100 mg (has no administration in time range)  polyethylene glycol (MIRALAX / GLYCOLAX) packet 17 g (has no administration in time range)  apixaban (ELIQUIS) tablet 5 mg (has no administration in  time range)  ferrous  sulfate tablet 325 mg (has no administration in time range)  loratadine (CLARITIN) tablet 10 mg (has no administration in time range)  fluticasone (FLONASE) 50 MCG/ACT nasal spray 2 spray (has no administration in time range)  montelukast (SINGULAIR) tablet 10 mg (has no administration in time range)  Carboxymethylcellulose Sod PF 1 % GEL 1 drop (has no administration in time range)  insulin aspart (novoLOG) injection 0-15 Units (has no administration in time range)  insulin aspart (novoLOG) injection 0-5 Units (has no administration in time range)  acetaminophen (TYLENOL) tablet 650 mg (has no administration in time range)    Or  acetaminophen (TYLENOL) suppository 650 mg (has no administration in time range)  traZODone (DESYREL) tablet 25 mg (has no administration in time range)  ondansetron (ZOFRAN) tablet 4 mg (has no administration in time range)    Or  ondansetron (ZOFRAN) injection 4 mg (has no administration in time range)  albuterol (PROVENTIL) (2.5 MG/3ML) 0.083% nebulizer solution 2.5 mg (has no administration in time range)  insulin aspart (novoLOG) injection 5 Units (5 Units Intravenous Given 03/21/23 1442)    And  dextrose 50 % solution 50 mL (50 mLs Intravenous Given 03/21/23 1443)  furosemide (LASIX) injection 40 mg (40 mg Intravenous Given 03/21/23 1443)  sodium chloride 0.9 % bolus 500 mL (500 mLs Intravenous New Bag/Given 03/21/23 1441)  calcium gluconate 1 g/ 50 mL sodium chloride IVPB (1,000 mg Intravenous New Bag/Given 03/21/23 1451)    Mobility non-ambulatory

## 2023-03-21 NOTE — H&P (Signed)
History and Physical  Kayla [name] FBP:[id number] DOB: 1941-02-01 DOA: 03/21/2023  PCP: Ronnald Nian, MD   Chief Complaint: abnormal labs   HPI: Kayla [name] is a 82 y.o. female with medical history significant for chronic atrial fibrillation, non-insulin-dependent type 2 diabetes, hypertension, obesity who is a resident of [facility] and brought to the ER for evaluation due to elevated potassium.  She had routine labs which showed potassium of 6.2.  Patient tells me that she has been feeling well, other than intermittent headache.  Unable to tell me if she has been on any new medications.  Specifically denies any nausea, vomiting, fevers, chills, confusion.  ED Course: The emergency department she has been a little hypertensive but otherwise vital signs are unremarkable.  Lab work was done and confirmed hyperkalemia 6.4 she has stable CKD and anemia.  EKG which I personally reviewed is in normal sinus rhythm with no T wave changes.  She was given IV calcium gluconate, insulin and glucose protocol, and hospitalist was contacted for admission.  Review of Systems: Please see HPI for pertinent positives and negatives. A complete 10 system review of systems are otherwise negative.  Past Medical History:  Diagnosis Date   Acute cystitis without hematuria 08/26/2021   Acute metabolic encephalopathy 08/26/2021   Arthritis    Atrial fibrillation, chronic (HCC)    Atrial flutter (HCC) 03/06/2021   COVID-19 virus infection 08/26/2021   Diabetes mellitus    Facial droop 08/26/2021   Hypertension    Lumbar nerve root impingement 06/24/2021   Obesity    Pulmonary embolus (HCC)    Type 2 diabetes mellitus (HCC) 03/05/2021   Past Surgical History:  Procedure Laterality Date   ABDOMINAL HYSTERECTOMY     CHOLECYSTECTOMY     JOINT REPLACEMENT  Right    Social History:  reports that she has never smoked. She has never used smokeless tobacco. She reports that she does not drink alcohol  and does not use drugs.   No Known Allergies  Family History  Problem Relation Age of Onset   Hypertension Mother      Prior to Admission medications   Medication Sig Start Date End Date Taking? Authorizing Provider  acetaminophen (TYLENOL) 325 MG tablet Take 650 mg by mouth every 8 (eight) hours as needed (pain).   Yes [provider]  amLODipine (NORVASC) 10 MG tablet Take 10 mg by mouth daily.   Yes [provider]  apixaban (ELIQUIS) 5 MG TABS tablet Take 5 mg by mouth 2 (two) times daily.   Yes [provider]  atorvastatin (LIPITOR) 40 MG tablet Take 40 mg by mouth at bedtime.   Yes [provider]  Carboxymethylcellulose Sod PF (REFRESH CELLUVISC) 1 % GEL Place 1 drop into both eyes in the morning and at bedtime.   Yes [provider]  carvedilol (COREG) 12.5 MG tablet Take 12.5 mg by mouth 2 (two) times daily with a meal.   Yes [provider]  cetirizine (ZYRTEC) 10 MG tablet Take 10 mg by mouth daily.   Yes [provider]  Cranberry 450 MG TABS Take 450 mg by mouth daily.   Yes [provider]  docusate sodium (COLACE) 100 MG capsule Take 100 mg by mouth daily. Take with prune juice   Yes [provider]  ezetimibe (ZETIA) 10 MG tablet Take 10 mg by mouth at bedtime.   Yes [provider]  ferrous sulfate 325 (65 FE) MG tablet  Take 325 mg by mouth every other day.   Yes [provider]  fluticasone (FLONASE) 50 MCG/ACT nasal spray Place 2 sprays into both nostrils in the morning and at bedtime.   Yes [provider]  gabapentin (NEURONTIN) 100 MG capsule Take 1 capsule (100 mg total) by mouth daily. Patient taking differently: Take 100 mg by mouth 2 (two) times daily. 12/03/21  Yes Rhetta Mura, MD  losartan (COZAAR) 50 MG tablet Take 50 mg by mouth in the morning and at bedtime.   Yes [provider]  melatonin 5 MG TABS Take 5 mg by mouth at bedtime as  needed (sleep).   Yes [provider]  Menthol, Topical Analgesic, (BIOFREEZE) 4 % GEL Apply 1 application  topically in the morning, at noon, and at bedtime. Apply thin layer topically to right shoulder and arm   Yes [provider]  metFORMIN (GLUCOPHAGE) 500 MG tablet Take 500 mg by mouth in the morning and at bedtime.   Yes [provider]  montelukast (SINGULAIR) 10 MG tablet Take 10 mg by mouth daily.   Yes [provider]  nitroGLYCERIN (NITROSTAT) 0.4 MG SL tablet Take up to 3 tablets Patient taking differently: 0.4 mg every 5 (five) minutes as needed for chest pain (max 3 tablets). 03/31/21  Yes Chandrasekhar, Mahesh A, MD  olopatadine (PATANOL) 0.1 % ophthalmic solution Place 2 drops into both eyes daily.   Yes [provider]  polyethylene glycol (MIRALAX / GLYCOLAX) 17 g packet Take 17 g by mouth daily as needed for mild constipation or moderate constipation.   Yes [provider]  polyethylene glycol (MIRALAX / GLYCOLAX) 17 g packet Take 17 g by mouth 2 (two) times a week. Monday and Thursday   Yes [provider]  spironolactone (ALDACTONE) 50 MG tablet Take 50 mg by mouth daily.   Yes [provider]    Physical Exam: BP (!) 145/66   Pulse (!) 59   Temp 97.6 F (36.4 C) (Oral)   Resp 18   SpO2 100%   General:  Alert, oriented, calm, in no acute distress, hard of hearing but pleasant and cooperative Eyes: EOMI, clear conjuctivae, white sclerea Neck: supple, no masses, trachea mildline  Cardiovascular: RRR, no murmurs or rubs, no peripheral edema  Respiratory: clear to auscultation bilaterally, no wheezes, no crackles  Abdomen: soft, nontender, nondistended, normal bowel tones heard  Skin: dry, no rashes  Musculoskeletal: no joint effusions, normal range of motion  Psychiatric: appropriate affect, normal speech  Neurologic: extraocular muscles intact, clear speech, moving all extremities with intact  sensorium          Labs on Admission:  Basic Metabolic Panel: Recent Labs  Lab 03/21/23 1314  NA 136  K 6.4*  CL 108  CO2 21*  GLUCOSE 103*  BUN 39*  CREATININE 1.17*  CALCIUM 8.7*   Liver Function Tests: No results for input(s): "AST", "ALT", "ALKPHOS", "BILITOT", "PROT", "ALBUMIN" in the last 168 hours. No results for input(s): "LIPASE", "AMYLASE" in the last 168 hours. No results for input(s): "AMMONIA" in the last 168 hours. CBC: Recent Labs  Lab 03/21/23 1314  WBC 5.4  HGB 9.6*  HCT 31.8*  MCV 89.1  PLT 224   Cardiac Enzymes: No results for input(s): "CKTOTAL", "CKMB", "CKMBINDEX", "TROPONINI" in the last 168 hours.  BNP (last 3 results) No results for input(s): "BNP" in the last 8760 hours.  ProBNP (last 3 results) No results for input(s): "PROBNP" in the  last 8760 hours.  CBG: Recent Labs  Lab 03/21/23 1439  GLUCAP 85    Radiological Exams on Admission: No results found.  Assessment/Plan Kayla [name] is a 82 y.o. female with medical history significant for chronic atrial fibrillation, CKD, non-insulin-dependent type 2 diabetes, hypertension, obesity who is a resident of [facility] and brought to the ER for evaluation due to elevated potassium.  Hyperkalemia-without EKG changes, treated in the ER.  Suspect this is due to Aldactone. -Observation admission -Discontinue Aldactone -Will recheck potassium level in the morning  History of atrial fibrillation-continue Eliquis and Coreg  Hypertension-continue amlodipine and Cozaar, hold Aldactone due to hyperkalemia as above  Type 2 diabetes-diabetic diet, hold metformin, SSI in the meantime  Hyperlipidemia-Zetia and Lipitor  Reactive airway disease-Singulair, Flonase, Claritin  DVT prophylaxis:  Eliquis    Code Status: Full Code-confirmed with patient, and DNR form at the bedside reviewed  Consults called: None  Admission status: Observation  Time spent: 46 minutes  Ashlen Kiger Sharlette Dense MD [facility] Pager [phone]  If 7PM-7AM, please contact night-coverage [email or web address] Password [facility]  03/21/2023, 3:28 PM

## 2023-03-22 DIAGNOSIS — E875 Hyperkalemia: Secondary | ICD-10-CM | POA: Diagnosis not present

## 2023-03-22 LAB — GLUCOSE, CAPILLARY
Glucose-Capillary: 98 mg/dL (ref 70–99)
Glucose-Capillary: 120 mg/dL — ABNORMAL HIGH (ref 70–99)
Glucose-Capillary: 93 mg/dL (ref 70–99)
Glucose-Capillary: 150 mg/dL — ABNORMAL HIGH (ref 70–99)

## 2023-03-22 LAB — CBC
HCT: 31 % — ABNORMAL LOW (ref 36.0–46.0)
Hemoglobin: 9.5 g/dL — ABNORMAL LOW (ref 12.0–15.0)
MCH: 27.3 pg (ref 26.0–34.0)
MCHC: 30.6 g/dL (ref 30.0–36.0)
MCV: 89.1 fL (ref 80.0–100.0)
Platelets: 219 10*3/uL (ref 150–400)
RBC: 3.48 MIL/uL — ABNORMAL LOW (ref 3.87–5.11)
RDW: 14.5 % (ref 11.5–15.5)
WBC: 5.3 10*3/uL (ref 4.0–10.5)
nRBC: 0 % (ref 0.0–0.2)

## 2023-03-22 LAB — BASIC METABOLIC PANEL
Anion gap: 7 (ref 5–15)
BUN: 37 mg/dL — ABNORMAL HIGH (ref 8–23)
CO2: 22 mmol/L (ref 22–32)
Calcium: 8.7 mg/dL — ABNORMAL LOW (ref 8.9–10.3)
Chloride: 106 mmol/L (ref 98–111)
Creatinine, Ser: 1.24 mg/dL — ABNORMAL HIGH (ref 0.44–1.00)
GFR, Estimated: 43 mL/min — ABNORMAL LOW (ref >60)
Glucose, Bld: 94 mg/dL (ref 70–99)
Potassium: 5.3 mmol/L — ABNORMAL HIGH (ref 3.5–5.1)
Sodium: 135 mmol/L (ref 135–145)

## 2023-03-22 LAB — POTASSIUM
Potassium: 6.1 mmol/L — ABNORMAL HIGH (ref 3.5–5.1)
Potassium: 5.5 mmol/L — ABNORMAL HIGH (ref 3.5–5.1)

## 2023-03-22 MED ORDER — SODIUM ZIRCONIUM CYCLOSILICATE 10 G PO PACK
10.0000 g | PACK | Freq: Once | ORAL | Status: AC
  Administered 2023-03-22: 10 g via ORAL
  Filled 2023-03-22: qty 1

## 2023-03-22 NOTE — TOC Initial Note (Signed)
Transition of Care [facility]) - Initial/Assessment Note    Patient Details  Name: Kayla [name] MRN: [id number] Date of Birth: 16-Sep-1940  Transition of [facility]) CM/SW Contact:    Beckie Busing, RN Phone Number:[phone]  03/22/2023, 3:56 PM  Clinical Narrative:                 TOC following patient admitted from [facility]. Patient states that she has been at [facility] for the past year. Patient acknowledges that the plan is that she will return to [place]. CM reached out to [facility] admissions director for [facility] to confirm patient is from the facility long term and can return. There is no answer. Message has been left. CM will follow up.   Expected Discharge Plan: Skilled Nursing Facility Barriers to Discharge: Continued Medical Work up   Patient Goals and CMS Choice Patient states their goals for this hospitalization and ongoing recovery are:: Wants to get better to go back to [facility].gov Compare Post Acute Care list provided to::  (n/a) Choice offered to / list presented to : NA [place] ownership interest in [facility].provided to::  (n/a)    Expected Discharge Plan and Services In-house Referral: NA Discharge Planning Services: CM Consult Post Acute Care Choice: NA Living arrangements for the past 2 months: Skilled Nursing Facility                 DME Arranged: N/A DME Agency: NA       HH Arranged: NA HH Agency: NA        Prior Living Arrangements/Services Living arrangements for the past 2 months: Skilled Nursing Facility Lives with:: Facility Resident Patient language and need for interpreter reviewed:: Yes Do you feel safe going back to the place where you live?: Yes      Need for Family Participation in Patient Care: Yes (Comment) Care giver support system in place?: Yes (comment) Current home services:  (n/a) Criminal Activity/Legal Involvement Pertinent to Current Situation/Hospitalization: No - Comment as  needed  Activities of Daily Living Home Assistive Devices/Equipment: Walker (specify type) ADL Screening (condition at time of admission) Patient's cognitive ability adequate to safely complete daily activities?: No Is the patient deaf or have difficulty hearing?: No Does the patient have difficulty seeing, even when wearing glasses/contacts?: Yes Does the patient have difficulty concentrating, remembering, or making decisions?: Yes Patient able to express need for assistance with ADLs?: Yes Does the patient have difficulty dressing or bathing?: Yes Independently performs ADLs?: No Communication: Independent Dressing (OT): Needs assistance Is this a change from baseline?: Pre-admission baseline Grooming: Needs assistance Is this a change from baseline?: Pre-admission baseline Feeding: Needs assistance Is this a change from baseline?: Pre-admission baseline Bathing: Needs assistance Is this a change from baseline?: Pre-admission baseline Toileting: Needs assistance Is this a change from baseline?: Pre-admission baseline In/Out Bed: Needs assistance Is this a change from baseline?: Pre-admission baseline Walks in Home: Needs assistance Is this a change from baseline?: Pre-admission baseline Does the patient have difficulty walking or climbing stairs?: Yes Weakness of Legs: Both Weakness of Arms/Hands: Both  Permission Sought/Granted Permission sought to share information with : Family Supports Permission granted to share information with : Yes, Verbal Permission Granted  Share Information with NAME: Lavetta Shelburn     Permission granted to share info w Relationship: son  Permission granted to share info w Contact Information: [phone]  Emotional Assessment Appearance:: Appears stated age Attitude/Demeanor/Rapport: Gracious Affect (typically observed): Pleasant, Quiet  Orientation: : Oriented to Self, Oriented to Place, Oriented to Situation Alcohol / Substance Use: Not  Applicable Psych Involvement: No (comment)  Admission diagnosis:  Hyperkalemia [E87.5] Patient Active Problem List   Diagnosis Date Noted   Hyperkalemia 03/21/2023   Pressure injury of skin 12/01/2021   AKI (acute kidney injury) (HCC) 11/30/2021   Protein-calorie malnutrition, severe (HCC) 11/30/2021   Sepsis due to undetermined organism (HCC) 11/30/2021   Acute cystitis without hematuria 08/26/2021   COVID-19 virus infection 08/26/2021   Facial droop 08/26/2021   Pressure ulcer of left heel 08/26/2021   Acute metabolic encephalopathy 08/26/2021   Unspecified atrial fibrillation (HCC) 06/24/2021   Spinal stenosis of lumbar region at multiple levels 06/24/2021   Lumbar nerve root impingement 06/24/2021   Generalized weakness 06/23/2021   Ambulatory dysfunction 06/23/2021   Spinal stenosis 06/23/2021   Atrial flutter (HCC) 03/06/2021   Atrial flutter with rapid ventricular response (HCC) 03/05/2021   Type 2 diabetes mellitus (HCC) 03/05/2021   Elevated troponin    Anemia due to chronic blood loss 08/30/2020   Constipation 08/30/2020   Feces contents abnormal 08/30/2020   Hemorrhoids 08/30/2020   Proctalgia 08/30/2020   Rectal bleeding 08/30/2020   Lymphedema 12/23/2019   Arthritis 12/18/2018   Fatigue 07/09/2018   Gastroesophageal reflux disease without esophagitis 04/16/2018   Dependent edema 01/01/2017   Anemia of chronic illness 02/15/2015   Multiple falls 02/15/2015   Diabetic neuropathy, type II diabetes mellitus (HCC) 09/09/2012   Hyperlipidemia associated with type 2 diabetes mellitus (HCC) 05/22/2011   Primary hypertension 05/22/2011   Class 2 obesity due to excess calories with body mass index (BMI) of 37.0 to 37.9 in adult 12/14/2010   History of pulmonary embolus (PE) 12/14/2010   PCP:  Ronnald Nian, MD Pharmacy:  No Pharmacies Listed    Social Determinants of Health (SDOH) Social History: SDOH Screenings   Food Insecurity: No Food Insecurity  (03/21/2023)  Housing: Low Risk  (03/21/2023)  Transportation Needs: No Transportation Needs (03/21/2023)  Utilities: Not At Risk (03/21/2023)  Depression (PHQ2-9): Low Risk  (04/16/2021)  Financial Resource Strain: Low Risk  (04/16/2021)  Physical Activity: Insufficiently Active (04/16/2021)  Social Connections: Unknown (04/16/2021)  Stress: No Stress Concern Present (04/16/2021)  Tobacco Use: Low Risk  (03/21/2023)   SDOH Interventions:     Readmission Risk Interventions    08/29/2021   12:47 PM  Readmission Risk Prevention Plan  Transportation Screening Complete  PCP or Specialist Appt within 5-7 Days Complete  Home Care Screening Not Complete  Medication Review (RN CM) Referral to Pharmacy

## 2023-03-22 NOTE — Evaluation (Signed)
Occupational Therapy Evaluation Patient Details Name: Kayla [name] MRN: [id number] DOB: 07-12-1941 Today's Date: 03/22/2023   History of Present Illness Pt is an 82 y/o F admitted on 03/21/23 for evaluation of elevated K+. PMH: chronic a-fib, NIDDM2, HTN, obesity   Clinical Impression   Pt presents with decline in function and safety with ADLs and ADL mobility with impaired strength, balance and endurance. PTA pt living at [facility] for ~ 1 year; pt states that it's not permanent and that she's only there for rehab. Pt reports that was able to sit EOB Ind'ly but that staff assists her with w/c and toilet transfers and staff assists her with LB ADLs. Pt currently requires mod A to stand to RW for SPTs, Sup with UB ADLs/grooming, max - total A with LB ADLs and max A wit toileting tasks. Pt would benefit from acute OT services to address impairments to maximize level of function and safety      If plan is discharge home, recommend the following: A lot of help with bathing/dressing/bathroom;A lot of help with walking and/or transfers;Assist for transportation;Direct supervision/assist for medications management    Functional Status Assessment  Patient has had a recent decline in their functional status and demonstrates the ability to make significant improvements in function in a reasonable and predictable amount of time.  Equipment Recommendations  None recommended by OT    Recommendations for Other Services       Precautions / Restrictions Precautions Precautions: Fall Restrictions Weight Bearing Restrictions: No      Mobility Bed Mobility               General bed mobility comments: pt in recliner upon arrival    Transfers Overall transfer level: Needs assistance Equipment used: Rolling walker (2 wheels) Transfers: Sit to/from Stand Sit to Stand: Mod assist                  Balance Overall balance assessment: Needs assistance Sitting-balance support:  Feet supported Sitting balance-Leahy Scale: Fair     Standing balance support: During functional activity, Bilateral upper extremity supported, Reliant on assistive device for balance Standing balance-Leahy Scale: Poor                             ADL either performed or assessed with clinical judgement   ADL Overall ADL's : Needs assistance/impaired Eating/Feeding: Independent;Sitting   Grooming: Wash/dry hands;Wash/dry face;Supervision/safety;Sitting   Upper Body Bathing: Supervision/ safety;Sitting Upper Body Bathing Details (indicate cue type and reason): simulated Lower Body Bathing: Maximal assistance Lower Body Bathing Details (indicate cue type and reason): simulated Upper Body Dressing : Supervision/safety   Lower Body Dressing: Total assistance   Toilet Transfer: Moderate assistance;Minimal assistance;Rolling walker (2 wheels);Stand-pivot;Cueing for safety   Toileting- Clothing Manipulation and Hygiene: Maximal assistance       Functional mobility during ADLs: Moderate assistance;Minimal assistance;Cueing for safety;Rolling walker (2 wheels)       Vision Ability to See in Adequate Light: 0 Adequate Patient Visual Report: No change from baseline       Perception         Praxis         Pertinent Vitals/Pain Pain Assessment Pain Assessment: Faces Pain Location: L foot, B knees when standing at RW Pain Descriptors / Indicators: Aching, Grimacing Pain Intervention(s): Monitored during session, Limited activity within patient's tolerance, Repositioned     Extremity/Trunk Assessment Upper Extremity Assessment Upper Extremity Assessment: Generalized  weakness   Lower Extremity Assessment Lower Extremity Assessment: Defer to PT evaluation       Communication     Cognition Arousal: Alert Behavior During Therapy: [facility] for tasks assessed/performed Overall Cognitive Status: No family/caregiver present to determine baseline cognitive  functioning                                 General Comments: Pt is oriented to time, place, and self but not situation. Follows simple commands with extra time PRN, pleasant throughout session.     General Comments       Exercises     Shoulder Instructions      Home Living Family/patient expects to be discharged to:: Skilled nursing facility                                 Additional Comments: Per chart pt is a resident of [facility], pt reports she has been there for a year but does not belive she's a resident & hoping she's just there for therapy.      Prior Functioning/Environment Prior Level of Function : Patient poor historian/Family not available             Mobility Comments: Pt alludes to working with someone on walking, but says that she uses a w/c ADLs Comments: reports that SNF staff assist her with LB ADLs        OT Problem List: Decreased strength;Impaired balance (sitting and/or standing);Pain;Obesity;Decreased activity tolerance;Decreased knowledge of use of DME or AE      OT Treatment/Interventions: Self-care/ADL training;DME and/or AE instruction;Therapeutic activities;Therapeutic exercise;Patient/family education    OT Goals(Current goals can be found in the care plan section) Acute Rehab OT Goals Patient Stated Goal: "get stronger" OT Goal Formulation: With patient Time For Goal Achievement: 04/04/23 Potential to Achieve Goals: Good  OT Frequency: Min 1X/week    Co-evaluation              AM-PAC OT "6 Clicks" Daily Activity     Outcome Measure Help from another person eating meals?: None Help from another person taking care of personal grooming?: A Little Help from another person toileting, which includes using toliet, bedpan, or urinal?: A Lot Help from another person bathing (including washing, rinsing, drying)?: A Lot Help from another person to put on and taking off regular upper body clothing?: A  Little Help from another person to put on and taking off regular lower body clothing?: A Lot 6 Click Score: 16   End of Session Equipment Utilized During Treatment: Gait belt;Rolling walker (2 wheels)  Activity Tolerance: Patient limited by pain Patient left: in chair;with call bell/phone within reach;with chair alarm set  OT Visit Diagnosis: Unsteadiness on feet (R26.81);Other abnormalities of gait and mobility (R26.89);Muscle weakness (generalized) (M62.81);Pain Pain - Right/Left: Left Pain - part of body: Knee (L ankle, B feet)                Time: 1336-1401 OT Time Calculation (min): 25 min Charges:  OT General Charges $OT Visit: 1 Visit OT Evaluation $OT Eval Low Complexity: 1 Low OT Treatments $Therapeutic Activity: 8-22 mins   Galen Manila 03/22/2023, 2:57 PM

## 2023-03-22 NOTE — Plan of Care (Signed)
  Problem: Education: Goal: Knowledge of General Education information will improve Description Including pain rating scale, medication(s)/side effects and non-pharmacologic comfort measures Outcome: Progressing   

## 2023-03-22 NOTE — Plan of Care (Signed)
  Problem: Education: Goal: Knowledge of General Education information will improve Description Including pain rating scale, medication(s)/side effects and non-pharmacologic comfort measures Outcome: Progressing   Problem: Health Behavior/Discharge Planning: Goal: Ability to manage health-related needs will improve Outcome: Progressing   

## 2023-03-22 NOTE — Evaluation (Signed)
Physical Therapy Evaluation Patient Details Name: Kayla [name] MRN: [id number] DOB: 1941-07-26 Today's Date: 03/22/2023  History of Present Illness  Pt is an 82 y/o F admitted on 03/21/23 for evaluation of elevated K+. PMH: chronic a-fib, NIDDM2, HTN, obesity  Clinical Impression  Pt seen for PT evaluation with pt agreeable to tx, pleasant, oriented x 3. Pt is able to complete STS & ambulate short distance in room with RW & supervision<>CGA. Pt endorses SOB after gait but SPO2 >90% on room air. Pt also endorses pain (see below). Pt declined further mobility 2/2 fatigue at this time. Will continue to follow pt acutely to progress mobility as able.        If plan is discharge home, recommend the following: A little help with walking and/or transfers;A little help with bathing/dressing/bathroom   Can travel by private vehicle        Equipment Recommendations Rolling walker (2 wheels)  Recommendations for Other Services       Functional Status Assessment Patient has had a recent decline in their functional status and demonstrates the ability to make significant improvements in function in a reasonable and predictable amount of time.     Precautions / Restrictions Precautions Precautions: Fall Restrictions Weight Bearing Restrictions: No      Mobility  Bed Mobility               General bed mobility comments: not tested, pt received & left sitting in recliner    Transfers Overall transfer level: Needs assistance Equipment used: Rolling walker (2 wheels) Transfers: Sit to/from Stand Sit to Stand: Supervision, Contact guard assist           General transfer comment: STS from recliner with supervision<>CGA, cuing to transition BUE from pushing to stand on armrests to RW    Ambulation/Gait Ambulation/Gait assistance: Contact guard assist, Supervision Gait Distance (Feet): 10 Feet Assistive device: Rolling walker (2 wheels) Gait Pattern/deviations: Decreased step  length - right, Decreased step length - left, Decreased dorsiflexion - right, Decreased dorsiflexion - left, Decreased stride length, Trunk flexed Gait velocity: decreased     General Gait Details: decreased foot clearance BLE  Stairs            Wheelchair Mobility     Tilt Bed    Modified Rankin (Stroke Patients Only)       Balance Overall balance assessment: Needs assistance Sitting-balance support: Feet supported Sitting balance-Leahy Scale: Fair     Standing balance support: During functional activity, Bilateral upper extremity supported, Reliant on assistive device for balance Standing balance-Leahy Scale: Fair                               Pertinent Vitals/Pain Pain Assessment Pain Assessment: Faces Faces Pain Scale: Hurts even more Pain Location: dorsal aspect of L foot/ankle at rest, notes buttocks pain when sitting, pain in BLE knees when standing Pain Descriptors / Indicators: Aching Pain Intervention(s): Monitored during session, Repositioned, Limited activity within patient's tolerance    Home Living Family/patient expects to be discharged to:: Skilled nursing facility                   Additional Comments: Per chart pt is a resident of [facility], pt reports she has been there for a year but does not belive she's a resident & hoping she's just there for therapy.    Prior Function Prior Level of Function : Patient poor historian/Family  not available             Mobility Comments: Pt alludes to working with someone on walking       Extremity/Trunk Assessment   Upper Extremity Assessment Upper Extremity Assessment: Generalized weakness    Lower Extremity Assessment Lower Extremity Assessment: Generalized weakness       Communication   Communication Communication: No apparent difficulties  Cognition Arousal: Alert Behavior During Therapy: WFL for tasks assessed/performed Overall Cognitive Status: No  family/caregiver present to determine baseline cognitive functioning                                 General Comments: Pt is oriented to time, place, and self but not situation. Follows simple commands with extra time PRN, pleasant throughout session.        General Comments General comments (skin integrity, edema, etc.): Pt c/o being "out of breath" after gait, SpO2 >90% on room air    Exercises     Assessment/Plan    PT Assessment Patient needs continued PT services  PT Problem List Decreased strength;Cardiopulmonary status limiting activity;Decreased activity tolerance;Decreased safety awareness;Decreased balance;Decreased mobility;Decreased knowledge of precautions;Decreased cognition       PT Treatment Interventions DME instruction;Balance training;Modalities;Neuromuscular re-education;Gait training;Stair training;Functional mobility training;Patient/family education;Cognitive remediation;Therapeutic activities;Therapeutic exercise;Manual techniques    PT Goals (Current goals can be found in the Care Plan section)  Acute Rehab PT Goals Patient Stated Goal: decreased BLE foot pain PT Goal Formulation: With patient Time For Goal Achievement: 04/05/23 Potential to Achieve Goals: Good    Frequency Min 1X/week     Co-evaluation               AM-PAC PT "6 Clicks" Mobility  Outcome Measure Help needed turning from your back to your side while in a flat bed without using bedrails?: A Little Help needed moving from lying on your back to sitting on the side of a flat bed without using bedrails?: A Little Help needed moving to and from a bed to a chair (including a wheelchair)?: A Little Help needed standing up from a chair using your arms (e.g., wheelchair or bedside chair)?: A Little Help needed to walk in hospital room?: A Little Help needed climbing 3-5 steps with a railing? : A Lot 6 Click Score: 17    End of Session   Activity Tolerance: Patient  tolerated treatment well;Patient limited by fatigue Patient left: in chair;with call bell/phone within reach;with chair alarm set Nurse Communication: Mobility status PT Visit Diagnosis: Muscle weakness (generalized) (M62.81);Difficulty in walking, not elsewhere classified (R26.2)    Time: 1610-9604 PT Time Calculation (min) (ACUTE ONLY): 11 min   Charges:   PT Evaluation $PT Eval Low Complexity: 1 Low   PT General Charges $$ ACUTE PT VISIT: 1 Visit         Aleda Grana, PT, DPT 03/22/23, 10:58 AM   Sandi Mariscal 03/22/2023, 10:57 AM

## 2023-03-22 NOTE — Progress Notes (Signed)
PROGRESS NOTE    Kayla [name]  DDU:[id number] DOB: 07-Jun-1941 DOA: 03/21/2023 PCP: Ronnald Nian, MD   Brief Narrative:  This 82 years old female with PMH significant for chronic atrial fibrillation, type 2 diabetes, hypertension, obesity who is a resident of [facility] brought in the ED for the evaluation of elevated potassium.  Patient had routine labs which showed potassium of 6.2 patient states she has been feeling well other than intermittent headache.  She has not been on any new medication.  Lab work confirmed hyperkalemia with potassium of 6.4 and stable CKD and anemia.  EKG normal sinus rhythm with no T wave changes.  Patient was given calcium gluconate IV insulin and dextrose admitted for further evaluation.  Assessment & Plan:   Principal Problem:   Hyperkalemia  Hyperkalemia: EKG without changes.  Treated in the ER. Suspects could be due to Aldactone. Discontinue Aldactone. Repeat potassium still remains elevated 6.1. Lokelma 10 mg IV given. Recheck a.m. labs in the morning  Hx. of Atrial fibrillation: Heart rate is well-controlled, continue Coreg and Eliquis.  Essential hypertension. Continue amlodipine and Cozaar.  Hold Aldactone due to hyperkalemia.  Type 2 diabetes: Diabetic diet,  hold metformin, regular insulin sliding scale.  Hyperlipidemia Continue Zetia and Lipitor  Reactive airway disease: Continue Singulair,  Flonase and Claritin.  CKD stage IIIA: Serum creatinine at baseline.  DVT prophylaxis: Eliquis Code Status: Full code Family Communication: No family at bedside Disposition Plan:   Status is: Observation The patient remains OBS appropriate and will d/c before 2 midnights.    Consultants:  None  Procedures: None Antimicrobials: None  Subjective: Patient was seen and examined at bedside.  Overnight events noted. Patient reports feeling weak and tired otherwise denies any other symptoms.  Objective: Vitals:    03/21/23 2359 03/22/23 0616 03/22/23 0820 03/22/23 1314  BP: (!) 149/56 (!) 160/79 (!) 154/65 114/69  Pulse: 73 70 71 67  Resp: 16 16    Temp: (!) 97.5 F (36.4 C) 97.8 F (36.6 C)  (!) 97.4 F (36.3 C)  TempSrc: Oral Oral  Oral  SpO2: 98% 98% 95% 92%  Weight:      Height:        Intake/Output Summary (Last 24 hours) at 03/22/2023 1335 Last data filed at 03/22/2023 0900 Gross per 24 hour  Intake 360 ml  Output 400 ml  Net -40 ml   Filed Weights   03/21/23 1830  Weight: 110.2 kg    Examination:  General exam: Appears calm and comfortable, deconditioned, not in any distress. Respiratory system: Clear to auscultation. Respiratory effort normal.  RR 15 Cardiovascular system: S1 & S2 heard, RRR. No JVD, murmurs, rubs, gallops or clicks. No pedal edema. Gastrointestinal system: Abdomen is non distended, soft and non tender.   Normal bowel sounds heard. Central nervous system: Alert and oriented x 3. No focal neurological deficits. Extremities: No edema, no cyanosis, no clubbing Skin: No rashes, lesions or ulcers Psychiatry: Judgement and insight appear normal. Mood & affect appropriate.     Data Reviewed: I have personally reviewed following labs and imaging studies  CBC: Recent Labs  Lab 03/21/23 1314 03/22/23 0553  WBC 5.4 5.3  HGB 9.6* 9.5*  HCT 31.8* 31.0*  MCV 89.1 89.1  PLT 224 219   Basic Metabolic Panel: Recent Labs  Lab 03/21/23 1314 03/22/23 0553 03/22/23 0945  NA 136 135  --   K 6.4* 5.3* 6.1*  CL 108 106  --  CO2 21* 22  --   GLUCOSE 103* 94  --   BUN 39* 37*  --   CREATININE 1.17* 1.24*  --   CALCIUM 8.7* 8.7*  --    GFR: Estimated Creatinine Clearance: 41 mL/min (A) (by C-G formula based on SCr of 1.24 mg/dL (H)). Liver Function Tests: No results for input(s): "AST", "ALT", "ALKPHOS", "BILITOT", "PROT", "ALBUMIN" in the last 168 hours. No results for input(s): "LIPASE", "AMYLASE" in the last 168 hours. No results for input(s): "AMMONIA"  in the last 168 hours. Coagulation Profile: No results for input(s): "INR", "PROTIME" in the last 168 hours. Cardiac Enzymes: No results for input(s): "CKTOTAL", "CKMB", "CKMBINDEX", "TROPONINI" in the last 168 hours. BNP (last 3 results) No results for input(s): "PROBNP" in the last 8760 hours. HbA1C: Recent Labs    03/21/23 1935  HGBA1C 7.0*   CBG: Recent Labs  Lab 03/21/23 1439 03/21/23 1815 03/21/23 2133 03/22/23 0713 03/22/23 1152  GLUCAP 85 138* 110* 98 120*   Lipid Profile: No results for input(s): "CHOL", "HDL", "LDLCALC", "TRIG", "CHOLHDL", "LDLDIRECT" in the last 72 hours. Thyroid Function Tests: No results for input(s): "TSH", "T4TOTAL", "FREET4", "T3FREE", "THYROIDAB" in the last 72 hours. Anemia Panel: No results for input(s): "VITAMINB12", "FOLATE", "FERRITIN", "TIBC", "IRON", "RETICCTPCT" in the last 72 hours. Sepsis Labs: No results for input(s): "PROCALCITON", "LATICACIDVEN" in the last 168 hours.  No results found for this or any previous visit (from the past 240 hour(s)).   Radiology Studies: No results found.  Scheduled Meds:  amLODipine  10 mg Oral Daily   apixaban  5 mg Oral BID   atorvastatin  40 mg Oral QHS   carvedilol  12.5 mg Oral BID WC   docusate sodium  100 mg Oral Daily   ezetimibe  10 mg Oral QHS   ferrous sulfate  325 mg Oral QODAY   fluticasone  2 spray Each Nare Daily   insulin aspart  0-15 Units Subcutaneous TID WC   insulin aspart  0-5 Units Subcutaneous QHS   loratadine  10 mg Oral Daily   losartan  50 mg Oral Daily   montelukast  10 mg Oral Daily   polyvinyl alcohol  1 drop Both Eyes QHS   Continuous Infusions:   LOS: 0 days    Time spent: 50 Mins    Willeen Niece, MD [facility]   If 7PM-7AM, please contact night-coverage

## 2023-03-23 DIAGNOSIS — E669 Obesity, unspecified: Secondary | ICD-10-CM | POA: Diagnosis present

## 2023-03-23 DIAGNOSIS — N1831 Chronic kidney disease, stage 3a: Secondary | ICD-10-CM | POA: Diagnosis present

## 2023-03-23 DIAGNOSIS — Z86711 Personal history of pulmonary embolism: Secondary | ICD-10-CM | POA: Diagnosis not present

## 2023-03-23 DIAGNOSIS — E1122 Type 2 diabetes mellitus with diabetic chronic kidney disease: Secondary | ICD-10-CM | POA: Diagnosis present

## 2023-03-23 DIAGNOSIS — Z6841 Body Mass Index (BMI) 40.0 and over, adult: Secondary | ICD-10-CM | POA: Diagnosis not present

## 2023-03-23 DIAGNOSIS — I482 Chronic atrial fibrillation, unspecified: Secondary | ICD-10-CM | POA: Diagnosis present

## 2023-03-23 DIAGNOSIS — T500X5A Adverse effect of mineralocorticoids and their antagonists, initial encounter: Secondary | ICD-10-CM | POA: Diagnosis present

## 2023-03-23 DIAGNOSIS — Z7984 Long term (current) use of oral hypoglycemic drugs: Secondary | ICD-10-CM | POA: Diagnosis not present

## 2023-03-23 DIAGNOSIS — E875 Hyperkalemia: Secondary | ICD-10-CM | POA: Diagnosis present

## 2023-03-23 DIAGNOSIS — Z8249 Family history of ischemic heart disease and other diseases of the circulatory system: Secondary | ICD-10-CM | POA: Diagnosis not present

## 2023-03-23 DIAGNOSIS — E785 Hyperlipidemia, unspecified: Secondary | ICD-10-CM | POA: Diagnosis present

## 2023-03-23 DIAGNOSIS — Z79899 Other long term (current) drug therapy: Secondary | ICD-10-CM | POA: Diagnosis not present

## 2023-03-23 DIAGNOSIS — K5900 Constipation, unspecified: Secondary | ICD-10-CM | POA: Diagnosis present

## 2023-03-23 DIAGNOSIS — Z7901 Long term (current) use of anticoagulants: Secondary | ICD-10-CM | POA: Diagnosis not present

## 2023-03-23 DIAGNOSIS — J45909 Unspecified asthma, uncomplicated: Secondary | ICD-10-CM | POA: Diagnosis present

## 2023-03-23 DIAGNOSIS — I129 Hypertensive chronic kidney disease with stage 1 through stage 4 chronic kidney disease, or unspecified chronic kidney disease: Secondary | ICD-10-CM | POA: Diagnosis present

## 2023-03-23 DIAGNOSIS — Z8616 Personal history of COVID-19: Secondary | ICD-10-CM | POA: Diagnosis not present

## 2023-03-23 LAB — BASIC METABOLIC PANEL
Anion gap: 8 (ref 5–15)
BUN: 41 mg/dL — ABNORMAL HIGH (ref 8–23)
CO2: 19 mmol/L — ABNORMAL LOW (ref 22–32)
Calcium: 8.5 mg/dL — ABNORMAL LOW (ref 8.9–10.3)
Chloride: 109 mmol/L (ref 98–111)
Creatinine, Ser: 1.23 mg/dL — ABNORMAL HIGH (ref 0.44–1.00)
GFR, Estimated: 43 mL/min — ABNORMAL LOW (ref >60)
Glucose, Bld: 109 mg/dL — ABNORMAL HIGH (ref 70–99)
Potassium: 5.1 mmol/L (ref 3.5–5.1)
Sodium: 136 mmol/L (ref 135–145)

## 2023-03-23 LAB — POTASSIUM: Potassium: 5.1 mmol/L (ref 3.5–5.1)

## 2023-03-23 LAB — GLUCOSE, CAPILLARY
Glucose-Capillary: 102 mg/dL — ABNORMAL HIGH (ref 70–99)
Glucose-Capillary: 113 mg/dL — ABNORMAL HIGH (ref 70–99)
Glucose-Capillary: 115 mg/dL — ABNORMAL HIGH (ref 70–99)
Glucose-Capillary: 189 mg/dL — ABNORMAL HIGH (ref 70–99)

## 2023-03-23 MED ORDER — SODIUM CHLORIDE 0.9 % IV SOLN: INTRAVENOUS | Status: AC

## 2023-03-23 MED ORDER — SENNOSIDES-DOCUSATE SODIUM 8.6-50 MG PO TABS
1.0000 | ORAL_TABLET | Freq: Two times a day (BID) | ORAL | Status: DC
  Administered 2023-03-23 – 2023-03-26 (×6): 1 via ORAL
  Filled 2023-03-23 (×7): qty 1

## 2023-03-23 NOTE — Plan of Care (Signed)

## 2023-03-23 NOTE — Progress Notes (Signed)
PROGRESS NOTE    Kayla [name]  ZOX:[id number] DOB: Sep 04, 1940 DOA: 03/21/2023 PCP: Ronnald Nian, MD   Brief Narrative:  This 82 years old female with PMH significant for chronic atrial fibrillation, type 2 diabetes, hypertension, obesity who is a resident of [facility] brought in the ED for the evaluation of elevated potassium.  Patient had routine labs which showed potassium of 6.2 patient states she has been feeling well other than intermittent headache.  She has not been on any new medication.  Lab work confirmed hyperkalemia with potassium of 6.4 and stable CKD and anemia.  EKG normal sinus rhythm with no T wave changes.  Patient was given calcium gluconate IV insulin and dextrose admitted for further evaluation.  Assessment & Plan:   Principal Problem:   Hyperkalemia  Hyperkalemia: EKG without acute changes.  Treated in the ER. Suspects could be due to Aldactone. Discontinue Aldactone. Repeat potassium still remains elevated 6.1. Lokelma 10 mg IV given. Recheck Potassium 5.1.  Hx. of Atrial fibrillation: Heart rate is well-controlled, Continue Coreg and Eliquis.  Essential hypertension. Continue amlodipine.  Hold Cozaar and Aldactone due to hyperkalemia.  Type 2 diabetes: Diabetic diet,  hold metformin, regular insulin sliding scale.  Hyperlipidemia Continue Zetia and Lipitor.  Reactive airway disease: Continue Singulair,  Flonase and Claritin.  CKD stage IIIA: Serum creatinine at baseline.  Constipation: Start Senokot and Colace.  DVT prophylaxis: Eliquis Code Status: Full code Family Communication: No family at bedside Disposition Plan:   Status is: Observation The patient remains OBS appropriate and will d/c before 2 midnights.   Admitted for hyperkalemia from a nursing home.  Likely secondary to Aldactone and Cozaar which are on hold.  Consultants:  None  Procedures: None Antimicrobials: None  Subjective: Patient was seen and  examined at bedside.  Overnight events noted. Patient still feeling very weak and tired,  denies any other symptoms.   She has not had a bowel movement in few days.  Objective: Vitals:   03/22/23 1710 03/22/23 2016 03/23/23 0632 03/23/23 1205  BP: (!) 142/59 (!) 118/95 (!) 158/67 (!) 127/57  Pulse: 75 77 80 73  Resp:  16 16 18   Temp:  98.2 F (36.8 C) 98 F (36.7 C) 98.2 F (36.8 C)  TempSrc:  Oral Oral Oral  SpO2:  96% 93% 98%  Weight:      Height:        Intake/Output Summary (Last 24 hours) at 03/23/2023 1307 Last data filed at 03/23/2023 0931 Gross per 24 hour  Intake 580 ml  Output 1200 ml  Net -620 ml   Filed Weights   03/21/23 1830  Weight: 110.2 kg    Examination:  General exam: Appears calm and comfortable, deconditioned, not in any distress. Respiratory system: Clear to auscultation. Respiratory effort normal.  RR 15 Cardiovascular system: S1 & S2 heard, RRR. No JVD, murmurs, rubs, gallops or clicks.  Gastrointestinal system: Abdomen is non distended, soft and non tender.   Normal bowel sounds heard. Central nervous system: Alert and oriented x 3. No focal neurological deficits. Extremities: No edema, no cyanosis, no clubbing Skin: No rashes, lesions or ulcers Psychiatry: Judgement and insight appear normal. Mood & affect appropriate.     Data Reviewed: I have personally reviewed following labs and imaging studies  CBC: Recent Labs  Lab 03/21/23 1314 03/22/23 0553  WBC 5.4 5.3  HGB 9.6* 9.5*  HCT 31.8* 31.0*  MCV 89.1 89.1  PLT 224 219   Basic  Metabolic Panel: Recent Labs  Lab 03/21/23 1314 03/22/23 0553 03/22/23 0945 03/22/23 2133 03/23/23 0810 03/23/23 1112  NA 136 135  --   --  136  --   K 6.4* 5.3* 6.1* 5.5* 5.1 5.1  CL 108 106  --   --  109  --   CO2 21* 22  --   --  19*  --   GLUCOSE 103* 94  --   --  109*  --   BUN 39* 37*  --   --  41*  --   CREATININE 1.17* 1.24*  --   --  1.23*  --   CALCIUM 8.7* 8.7*  --   --  8.5*  --     GFR: Estimated Creatinine Clearance: 41.3 mL/min (A) (by C-G formula based on SCr of 1.23 mg/dL (H)). Liver Function Tests: No results for input(s): "AST", "ALT", "ALKPHOS", "BILITOT", "PROT", "ALBUMIN" in the last 168 hours. No results for input(s): "LIPASE", "AMYLASE" in the last 168 hours. No results for input(s): "AMMONIA" in the last 168 hours. Coagulation Profile: No results for input(s): "INR", "PROTIME" in the last 168 hours. Cardiac Enzymes: No results for input(s): "CKTOTAL", "CKMB", "CKMBINDEX", "TROPONINI" in the last 168 hours. BNP (last 3 results) No results for input(s): "PROBNP" in the last 8760 hours. HbA1C: Recent Labs    03/21/23 1935  HGBA1C 7.0*   CBG: Recent Labs  Lab 03/22/23 1152 03/22/23 1653 03/22/23 2017 03/23/23 0732 03/23/23 1124  GLUCAP 120* 93 150* 102* 113*   Lipid Profile: No results for input(s): "CHOL", "HDL", "LDLCALC", "TRIG", "CHOLHDL", "LDLDIRECT" in the last 72 hours. Thyroid Function Tests: No results for input(s): "TSH", "T4TOTAL", "FREET4", "T3FREE", "THYROIDAB" in the last 72 hours. Anemia Panel: No results for input(s): "VITAMINB12", "FOLATE", "FERRITIN", "TIBC", "IRON", "RETICCTPCT" in the last 72 hours. Sepsis Labs: No results for input(s): "PROCALCITON", "LATICACIDVEN" in the last 168 hours.  No results found for this or any previous visit (from the past 240 hour(s)).   Radiology Studies: No results found.  Scheduled Meds:  amLODipine  10 mg Oral Daily   apixaban  5 mg Oral BID   atorvastatin  40 mg Oral QHS   carvedilol  12.5 mg Oral BID WC   docusate sodium  100 mg Oral Daily   ezetimibe  10 mg Oral QHS   ferrous sulfate  325 mg Oral QODAY   fluticasone  2 spray Each Nare Daily   insulin aspart  0-15 Units Subcutaneous TID WC   insulin aspart  0-5 Units Subcutaneous QHS   loratadine  10 mg Oral Daily   montelukast  10 mg Oral Daily   polyvinyl alcohol  1 drop Both Eyes QHS   senna-docusate  1 tablet Oral BID    Continuous Infusions:   LOS: 0 days    Time spent: 35 Mins    Willeen Niece, MD [facility]   If 7PM-7AM, please contact night-coverage

## 2023-03-23 NOTE — TOC Progression Note (Signed)
Transition of Care [facility]) - Progression Note    Patient Details  Name: Kayla [name] MRN: [id number] Date of Birth: Jun 18, 1941  Transition of Care [facility]) CM/SW Contact  Beckie Busing, RN Phone Number:[phone]  03/23/2023, 9:58 AM  Clinical Narrative:    CM spoke with Star admissions director at [facility]. Star confirms that patient is from [place] long term and she can return once medically ready.    Expected Discharge Plan: Skilled Nursing Facility Barriers to Discharge: Continued Medical Work up  Expected Discharge Plan and Services In-house Referral: NA Discharge Planning Services: CM Consult Post Acute Care Choice: NA Living arrangements for the past 2 months: Skilled Nursing Facility                 DME Arranged: N/A DME Agency: NA       HH Arranged: NA HH Agency: NA         Social Determinants of Health (SDOH) Interventions SDOH Screenings   Food Insecurity: No Food Insecurity (03/21/2023)  Housing: Low Risk  (03/21/2023)  Transportation Needs: No Transportation Needs (03/21/2023)  Utilities: Not At Risk (03/21/2023)  Depression (PHQ2-9): Low Risk  (04/16/2021)  Financial Resource Strain: Low Risk  (04/16/2021)  Physical Activity: Insufficiently Active (04/16/2021)  Social Connections: Unknown (04/16/2021)  Stress: No Stress Concern Present (04/16/2021)  Tobacco Use: Low Risk  (03/21/2023)    Readmission Risk Interventions    08/29/2021   12:47 PM  Readmission Risk Prevention Plan  Transportation Screening Complete  PCP or Specialist Appt within 5-7 Days Complete  Home Care Screening Not Complete  Medication Review (RN CM) Referral to Pharmacy

## 2023-03-23 NOTE — Care Management Obs Status (Signed)
MEDICARE OBSERVATION STATUS NOTIFICATION   Patient Details  Name: Kayla [name] MRN: [id number] Date of Birth: [birth year]   Medicare Observation Status Notification Given:  Yes    Beckie Busing, RN 03/23/2023, 9:38 AM

## 2023-03-23 NOTE — Progress Notes (Signed)
Mobility Specialist - Progress Note   03/23/23 0950  Mobility  Activity Ambulated with assistance in room  Level of Assistance Standby assist, set-up cues, supervision of patient - no hands on  Assistive Device Front wheel walker  Distance Ambulated (ft) 35 ft  Activity Response Tolerated well  Mobility Referral Yes  $Mobility charge 1 Mobility  Mobility Specialist Start Time (ACUTE ONLY) 0933  Mobility Specialist Stop Time (ACUTE ONLY) 0949  Mobility Specialist Time Calculation (min) (ACUTE ONLY) 16 min   Pt received in bed and agreeable to mobility. Pt walked around bed x1 before complaining of knee pain. Pt to recliner after session with all needs met & chair alarm on.    [facility]

## 2023-03-24 DIAGNOSIS — E875 Hyperkalemia: Secondary | ICD-10-CM | POA: Diagnosis not present

## 2023-03-24 LAB — GLUCOSE, CAPILLARY
Glucose-Capillary: 110 mg/dL — ABNORMAL HIGH (ref 70–99)
Glucose-Capillary: 129 mg/dL — ABNORMAL HIGH (ref 70–99)
Glucose-Capillary: 104 mg/dL — ABNORMAL HIGH (ref 70–99)
Glucose-Capillary: 188 mg/dL — ABNORMAL HIGH (ref 70–99)

## 2023-03-24 LAB — BASIC METABOLIC PANEL
Anion gap: 7 (ref 5–15)
BUN: 49 mg/dL — ABNORMAL HIGH (ref 8–23)
CO2: 22 mmol/L (ref 22–32)
Calcium: 8.2 mg/dL — ABNORMAL LOW (ref 8.9–10.3)
Chloride: 107 mmol/L (ref 98–111)
Creatinine, Ser: 1.37 mg/dL — ABNORMAL HIGH (ref 0.44–1.00)
GFR, Estimated: 38 mL/min — ABNORMAL LOW (ref >60)
Glucose, Bld: 165 mg/dL — ABNORMAL HIGH (ref 70–99)
Potassium: 5.3 mmol/L — ABNORMAL HIGH (ref 3.5–5.1)
Sodium: 136 mmol/L (ref 135–145)

## 2023-03-24 MED ORDER — SODIUM CHLORIDE 0.9 % IV BOLUS
500.0000 mL | Freq: Once | INTRAVENOUS | Status: AC
  Administered 2023-03-24: 500 mL via INTRAVENOUS

## 2023-03-24 MED ORDER — SODIUM ZIRCONIUM CYCLOSILICATE 10 G PO PACK
10.0000 g | PACK | Freq: Once | ORAL | Status: AC
  Administered 2023-03-24: 10 g via ORAL
  Filled 2023-03-24: qty 1

## 2023-03-24 NOTE — Discharge Instructions (Signed)
Advised to discontinue Aldactone and losartan as it caused hyperkalemia. Will look his PCP to adjust her blood pressure medication.

## 2023-03-24 NOTE — Plan of Care (Signed)

## 2023-03-24 NOTE — Progress Notes (Signed)
PROGRESS NOTE    Kayla [name]  ZOX:[id number] DOB: 1940-08-15 DOA: 03/21/2023 PCP: Ronnald Nian, MD   Brief Narrative:  This 82 years old female with PMH significant for chronic atrial fibrillation, type 2 diabetes, hypertension, obesity who is a resident of [facility] brought in the ED for the evaluation of elevated potassium.  Patient had routine labs which showed potassium of 6.2 patient states she has been feeling well other than intermittent headache.  She has not been on any new medication.  Lab work confirmed hyperkalemia with potassium of 6.4 and stable CKD and anemia.  EKG normal sinus rhythm with no T wave changes.  Patient was given calcium gluconate IV insulin and dextrose admitted for further evaluation.  Assessment & Plan:   Principal Problem:   Hyperkalemia  Hyperkalemia: EKG without acute changes.  Treated in the ER. Suspects could be due to Aldactone. Discontinue Aldactone and losartan. Repeat potassium still remains elevated  @ 6.1. Recheck Potassium 5.3.  Lokelma given x1,  Check am potassium   Hx. of Atrial fibrillation: Heart rate is well-controlled, Continue Coreg and Eliquis.  Essential hypertension. Continue amlodipine.  Hold Cozaar and Aldactone due to hyperkalemia.  Type 2 diabetes: Diabetic diet,  hold metformin, regular insulin sliding scale.  Hyperlipidemia Continue Zetia and Lipitor.  Reactive airway disease: Continue Singulair,  Flonase and Claritin.  CKD stage IIIA: Serum creatinine at baseline.  Constipation: Continue  Senokot and Colace.  DVT prophylaxis: Eliquis Code Status: Full code Family Communication: No family at bedside Disposition Plan:    Status is: Inpatient Remains inpatient appropriate because:      Admitted for hyperkalemia from a nursing home.  Likely secondary to Aldactone and Cozaar which are on hold.  Consultants:  None  Procedures: None Antimicrobials: None  Subjective: Patient was  seen and examined at bedside.  Overnight events noted. Patient reports feeling better, denies any pain, reports has not had a bowel movement yet.   Objective: Vitals:   03/23/23 2059 03/23/23 2200 03/24/23 0515 03/24/23 1318  BP: (!) 145/59 (!) 125/49 (!) 128/53 116/76  Pulse: 75 75 73 83  Resp: 18  18 16   Temp: 98.4 F (36.9 C)  98.1 F (36.7 C) 98.1 F (36.7 C)  TempSrc: Oral  Oral Oral  SpO2: 98%  96% 98%  Weight:      Height:        Intake/Output Summary (Last 24 hours) at 03/24/2023 1409 Last data filed at 03/24/2023 1300 Gross per 24 hour  Intake 1019.21 ml  Output 1050 ml  Net -30.79 ml   Filed Weights   03/21/23 1830  Weight: 110.2 kg    Examination:  General exam: Appears calm and comfortable, deconditioned, NAD. Respiratory system: CTA bilaterally. Respiratory effort normal.  RR 14 Cardiovascular system: S1 & S2 heard, RRR. No JVD, murmurs, rubs, gallops or clicks.  Gastrointestinal system: Abdomen is non distended, soft and non tender.   Normal bowel sounds heard. Central nervous system: Alert and oriented x 3. No focal neurological deficits. Extremities: No edema, no cyanosis, no clubbing Skin: No rashes, lesions or ulcers Psychiatry: Judgement and insight appear normal. Mood & affect appropriate.     Data Reviewed: I have personally reviewed following labs and imaging studies  CBC: Recent Labs  Lab 03/21/23 1314 03/22/23 0553  WBC 5.4 5.3  HGB 9.6* 9.5*  HCT 31.8* 31.0*  MCV 89.1 89.1  PLT 224 219   Basic Metabolic Panel: Recent Labs  Lab 03/21/23  1314 03/22/23 0553 03/22/23 0945 03/22/23 2133 03/23/23 0810 03/23/23 1112 03/24/23 1334  NA 136 135  --   --  136  --  136  K 6.4* 5.3* 6.1* 5.5* 5.1 5.1 5.3*  CL 108 106  --   --  109  --  107  CO2 21* 22  --   --  19*  --  22  GLUCOSE 103* 94  --   --  109*  --  165*  BUN 39* 37*  --   --  41*  --  49*  CREATININE 1.17* 1.24*  --   --  1.23*  --  1.37*  CALCIUM 8.7* 8.7*  --   --   8.5*  --  8.2*   GFR: Estimated Creatinine Clearance: 37.1 mL/min (A) (by C-G formula based on SCr of 1.37 mg/dL (H)). Liver Function Tests: No results for input(s): "AST", "ALT", "ALKPHOS", "BILITOT", "PROT", "ALBUMIN" in the last 168 hours. No results for input(s): "LIPASE", "AMYLASE" in the last 168 hours. No results for input(s): "AMMONIA" in the last 168 hours. Coagulation Profile: No results for input(s): "INR", "PROTIME" in the last 168 hours. Cardiac Enzymes: No results for input(s): "CKTOTAL", "CKMB", "CKMBINDEX", "TROPONINI" in the last 168 hours. BNP (last 3 results) No results for input(s): "PROBNP" in the last 8760 hours. HbA1C: Recent Labs    03/21/23 1935  HGBA1C 7.0*   CBG: Recent Labs  Lab 03/23/23 1124 03/23/23 1618 03/23/23 2059 03/24/23 0755 03/24/23 1211  GLUCAP 113* 115* 189* 110* 129*   Lipid Profile: No results for input(s): "CHOL", "HDL", "LDLCALC", "TRIG", "CHOLHDL", "LDLDIRECT" in the last 72 hours. Thyroid Function Tests: No results for input(s): "TSH", "T4TOTAL", "FREET4", "T3FREE", "THYROIDAB" in the last 72 hours. Anemia Panel: No results for input(s): "VITAMINB12", "FOLATE", "FERRITIN", "TIBC", "IRON", "RETICCTPCT" in the last 72 hours. Sepsis Labs: No results for input(s): "PROCALCITON", "LATICACIDVEN" in the last 168 hours.  No results found for this or any previous visit (from the past 240 hour(s)).   Radiology Studies: No results found.  Scheduled Meds:  amLODipine  10 mg Oral Daily   apixaban  5 mg Oral BID   atorvastatin  40 mg Oral QHS   carvedilol  12.5 mg Oral BID WC   docusate sodium  100 mg Oral Daily   ezetimibe  10 mg Oral QHS   ferrous sulfate  325 mg Oral QODAY   fluticasone  2 spray Each Nare Daily   insulin aspart  0-15 Units Subcutaneous TID WC   insulin aspart  0-5 Units Subcutaneous QHS   loratadine  10 mg Oral Daily   montelukast  10 mg Oral Daily   polyvinyl alcohol  1 drop Both Eyes QHS   senna-docusate   1 tablet Oral BID   sodium zirconium cyclosilicate  10 g Oral Once   Continuous Infusions:   LOS: 1 day    Time spent: 35 Mins    Willeen Niece, MD [facility]   If 7PM-7AM, please contact night-coverage

## 2023-03-24 NOTE — TOC Transition Note (Addendum)
Transition of Care [facility]) - CM/SW Discharge Note   Patient Details  Name: Kayla [name] MRN: [id number] Date of Birth: July 04, 1941  [facility] CM/SW Contact:  Georgie Chard, LCSW Phone Number: 03/24/2023, 2:08 PM   Clinical Narrative:    CSW has informed facility [facility] that patient will return today. AT this time there are no further TOC needs.    Addend@ 2:14 pm   At this time the provider has cancelled DC. Provider is excepting Patient to DC tomorrow. Please update the med/form which was given to the nurse along with face- sheet when the patient is ready for DC. TOC will continue to follow.      Barriers to Discharge: Continued Medical Work up   Patient Goals and CMS Choice CMS Medicare.gov Compare Post Acute Care list provided to::  (n/a) Choice offered to / list presented to : NA  Discharge Placement                         Discharge Plan and Services Additional resources added to the After Visit Summary for   In-house Referral: NA Discharge Planning Services: CM Consult Post Acute Care Choice: NA          DME Arranged: N/A DME Agency: NA       HH Arranged: NA HH Agency: NA        Social Determinants of Health (SDOH) Interventions SDOH Screenings   Food Insecurity: No Food Insecurity (03/21/2023)  Housing: Low Risk  (03/21/2023)  Transportation Needs: No Transportation Needs (03/21/2023)  Utilities: Not At Risk (03/21/2023)  Depression (PHQ2-9): Low Risk  (04/16/2021)  Financial Resource Strain: Low Risk  (04/16/2021)  Physical Activity: Insufficiently Active (04/16/2021)  Social Connections: Unknown (04/16/2021)  Stress: No Stress Concern Present (04/16/2021)  Tobacco Use: Low Risk  (03/21/2023)     Readmission Risk Interventions    08/29/2021   12:47 PM  Readmission Risk Prevention Plan  Transportation Screening Complete  PCP or Specialist Appt within 5-7 Days Complete  Home Care Screening Not Complete  Medication Review  (RN CM) Referral to Pharmacy

## 2023-03-25 DIAGNOSIS — E875 Hyperkalemia: Secondary | ICD-10-CM | POA: Diagnosis not present

## 2023-03-25 LAB — BASIC METABOLIC PANEL
Anion gap: 4 — ABNORMAL LOW (ref 5–15)
BUN: 41 mg/dL — ABNORMAL HIGH (ref 8–23)
CO2: 24 mmol/L (ref 22–32)
Calcium: 8.4 mg/dL — ABNORMAL LOW (ref 8.9–10.3)
Chloride: 108 mmol/L (ref 98–111)
Creatinine, Ser: 1.1 mg/dL — ABNORMAL HIGH (ref 0.44–1.00)
GFR, Estimated: 50 mL/min — ABNORMAL LOW (ref >60)
Glucose, Bld: 105 mg/dL — ABNORMAL HIGH (ref 70–99)
Potassium: 5.1 mmol/L (ref 3.5–5.1)
Sodium: 136 mmol/L (ref 135–145)

## 2023-03-25 LAB — GLUCOSE, CAPILLARY
Glucose-Capillary: 113 mg/dL — ABNORMAL HIGH (ref 70–99)
Glucose-Capillary: 221 mg/dL — ABNORMAL HIGH (ref 70–99)
Glucose-Capillary: 211 mg/dL — ABNORMAL HIGH (ref 70–99)
Glucose-Capillary: 104 mg/dL — ABNORMAL HIGH (ref 70–99)

## 2023-03-25 NOTE — Progress Notes (Signed)
Chaplain responded to Spiritual Care Consult to provide Bible to patient. Patient was alert and willing to visit, she received comfort and support from Bible that was provided, reflective listening and encouragement.  Chaplain [place], [place]   03/25/23 1430  Spiritual Encounters  Type of Visit Initial  Care provided to: Patient  Referral source Nurse (RN/NT/LPN)  Reason for visit Routine spiritual support  OnCall Visit Yes  Spiritual Framework  Presenting Themes Coping tools  Patient Stress Factors Health changes  Interventions  Spiritual Care Interventions Made Reflective listening;Compassionate presence  Intervention Outcomes  Outcomes Connection to spiritual care

## 2023-03-25 NOTE — Plan of Care (Signed)
  Problem: Education: Goal: Knowledge of General Education information will improve Description: Including pain rating scale, medication(s)/side effects and non-pharmacologic comfort measures Outcome: Progressing   Problem: Health Behavior/Discharge Planning: Goal: Ability to manage health-related needs will improve Outcome: Progressing   Problem: Clinical Measurements: Goal: Ability to maintain clinical measurements within normal limits will improve Outcome: Progressing Goal: Will remain free from infection Outcome: Progressing Goal: Diagnostic test results will improve Outcome: Progressing Goal: Respiratory complications will improve Outcome: Progressing Goal: Cardiovascular complication will be avoided Outcome: Progressing   Problem: Clinical Measurements: Goal: Will remain free from infection Outcome: Progressing   Problem: Clinical Measurements: Goal: Diagnostic test results will improve Outcome: Progressing   Problem: Clinical Measurements: Goal: Respiratory complications will improve Outcome: Progressing   Problem: Clinical Measurements: Goal: Cardiovascular complication will be avoided Outcome: Progressing   Problem: Activity: Goal: Risk for activity intolerance will decrease Outcome: Progressing   Problem: Nutrition: Goal: Adequate nutrition will be maintained Outcome: Progressing   Problem: Coping: Goal: Level of anxiety will decrease Outcome: Progressing   Problem: Elimination: Goal: Will not experience complications related to bowel motility Outcome: Progressing Goal: Will not experience complications related to urinary retention Outcome: Progressing   Problem: Elimination: Goal: Will not experience complications related to urinary retention Outcome: Progressing   Problem: Safety: Goal: Ability to remain free from injury will improve Outcome: Progressing   Problem: Skin Integrity: Goal: Risk for impaired skin integrity will  decrease Outcome: Progressing

## 2023-03-25 NOTE — Plan of Care (Signed)
  Problem: Education: Goal: Knowledge of General Education information will improve Description: Including pain rating scale, medication(s)/side effects and non-pharmacologic comfort measures Outcome: Progressing   Problem: Health Behavior/Discharge Planning: Goal: Ability to manage health-related needs will improve Outcome: Progressing   Problem: Clinical Measurements: Goal: Ability to maintain clinical measurements within normal limits will improve Outcome: Progressing Goal: Will remain free from infection Outcome: Progressing Goal: Diagnostic test results will improve Outcome: Progressing Goal: Cardiovascular complication will be avoided Outcome: Progressing   Problem: Activity: Goal: Risk for activity intolerance will decrease Outcome: Progressing   Problem: Skin Integrity: Goal: Risk for impaired skin integrity will decrease Outcome: Progressing

## 2023-03-25 NOTE — Progress Notes (Signed)
PROGRESS NOTE    Kayla [name]  UEA:[id number]  DOB: 1941/07/22  DOA: 03/21/2023 PCP: Ronnald Nian, MD Outpatient Specialists:   Hospital course:  82 year old female with A-fib, DM 2, HTN was transferred from [facility] for elevated potassium of 6.4.  Patient was treated with Lokelma and potassium has normalized.   Subjective:  Patient states she feels well and would like to go home if possible.   Objective: Vitals:   03/24/23 2054 03/25/23 0520 03/25/23 1128 03/25/23 1419  BP: (!) 142/42 (!) 157/56 (!) 135/52 (!) 137/53  Pulse: 81 74 73 75  Resp: 18 18  17   Temp: 98.5 F (36.9 C) 98.1 F (36.7 C)  97.7 F (36.5 C)  TempSrc: Oral Oral  Oral  SpO2: 97% 99%  98%  Weight:      Height:        Intake/Output Summary (Last 24 hours) at 03/25/2023 1555 Last data filed at 03/25/2023 1047 Gross per 24 hour  Intake 976.92 ml  Output 1650 ml  Net -673.08 ml   Filed Weights   03/21/23 1830  Weight: 110.2 kg     Exam:  General: Well-appearing female in good spirits sitting up in bed in NAD Eyes: sclera anicteric, conjuctiva mild injection bilaterally CVS: S1-S2, regular  Respiratory:  decreased air entry bilaterally secondary to decreased inspiratory effort, rales at bases  GI: NABS, soft, NT  LE: Warm and well-perfused Neuro: A/O x 3,  grossly nonfocal.   Data Reviewed:  Basic Metabolic Panel: Recent Labs  Lab 03/21/23 1314 03/22/23 0553 03/22/23 0945 03/22/23 2133 03/23/23 0810 03/23/23 1112 03/24/23 1334 03/25/23 0800  NA 136 135  --   --  136  --  136 136  K 6.4* 5.3*   < > 5.5* 5.1 5.1 5.3* 5.1  CL 108 106  --   --  109  --  107 108  CO2 21* 22  --   --  19*  --  22 24  GLUCOSE 103* 94  --   --  109*  --  165* 105*  BUN 39* 37*  --   --  41*  --  49* 41*  CREATININE 1.17* 1.24*  --   --  1.23*  --  1.37* 1.10*  CALCIUM 8.7* 8.7*  --   --  8.5*  --  8.2* 8.4*   < > = values in this interval not displayed.    CBC: Recent Labs   Lab 03/21/23 1314 03/22/23 0553  WBC 5.4 5.3  HGB 9.6* 9.5*  HCT 31.8* 31.0*  MCV 89.1 89.1  PLT 224 219     Scheduled Meds:  amLODipine  10 mg Oral Daily   apixaban  5 mg Oral BID   atorvastatin  40 mg Oral QHS   carvedilol  12.5 mg Oral BID WC   docusate sodium  100 mg Oral Daily   ezetimibe  10 mg Oral QHS   ferrous sulfate  325 mg Oral QODAY   fluticasone  2 spray Each Nare Daily   insulin aspart  0-15 Units Subcutaneous TID WC   insulin aspart  0-5 Units Subcutaneous QHS   loratadine  10 mg Oral Daily   montelukast  10 mg Oral Daily   polyvinyl alcohol  1 drop Both Eyes QHS   senna-docusate  1 tablet Oral BID   Continuous Infusions:   Assessment & Plan:   Hyperkalemia HTN Potassium has normalized with treatment EP is well-controlled off  of losartan and Aldactone Continue amlodipine carvedilol  Disposition Patient is medically cleared to go back to [facility] and rehab however case manager has not been able to get in touch with [facility] despite multiple attempts.  DM2 Continue present management  Atrial fibrillation Rate is well-controlled on carvedilol Eliquis for secondary stroke prevention     DVT prophylaxis: Eliquis Code Status: Full Family Communication: None today       Studies: No results found.  Principal Problem:   Hyperkalemia     Pieter Partridge, [facility]  If 7PM-7AM, please contact night-coverage [email or web address]   LOS: 2 days

## 2023-03-25 NOTE — TOC Progression Note (Signed)
Transition of Care [facility]) - Progression Note    Patient Details  Name: Kayla [name] MRN: [id number] Date of Birth: 1941-01-03  [facility] CM/SW Contact  Carmina Miller, LCSWA Phone Number: 03/25/2023, 11:47 AM  Clinical Narrative:    Update-11:47 am- CSW attempted to call AD Lawerance Cruel at [facility] again, no answer, vm left.  CSW notified by MD that pt was ready for dc. CSW called [facility], no answer-vm left, text message sent.   Expected Discharge Plan: Skilled Nursing Facility Barriers to Discharge: Continued Medical Work up  Expected Discharge Plan and Services In-house Referral: NA Discharge Planning Services: CM Consult Post Acute Care Choice: NA Living arrangements for the past 2 months: Skilled Nursing Facility Expected Discharge Date: 03/24/23               DME Arranged: N/A DME Agency: NA       HH Arranged: NA HH Agency: NA         Social Determinants of Health (SDOH) Interventions SDOH Screenings   Food Insecurity: No Food Insecurity (03/21/2023)  Housing: Low Risk  (03/21/2023)  Transportation Needs: No Transportation Needs (03/21/2023)  Utilities: Not At Risk (03/21/2023)  Depression (PHQ2-9): Low Risk  (04/16/2021)  Financial Resource Strain: Low Risk  (04/16/2021)  Physical Activity: Insufficiently Active (04/16/2021)  Social Connections: Unknown (04/16/2021)  Stress: No Stress Concern Present (04/16/2021)  Tobacco Use: Low Risk  (03/21/2023)    Readmission Risk Interventions    08/29/2021   12:47 PM  Readmission Risk Prevention Plan  Transportation Screening Complete  PCP or Specialist Appt within 5-7 Days Complete  Home Care Screening Not Complete  Medication Review (RN CM) Referral to Pharmacy

## 2023-03-26 DIAGNOSIS — E875 Hyperkalemia: Secondary | ICD-10-CM | POA: Diagnosis not present

## 2023-03-26 LAB — BASIC METABOLIC PANEL
Anion gap: 9 (ref 5–15)
BUN: 39 mg/dL — ABNORMAL HIGH (ref 8–23)
CO2: 24 mmol/L (ref 22–32)
Calcium: 8.9 mg/dL (ref 8.9–10.3)
Chloride: 106 mmol/L (ref 98–111)
Creatinine, Ser: 1.06 mg/dL — ABNORMAL HIGH (ref 0.44–1.00)
GFR, Estimated: 52 mL/min — ABNORMAL LOW (ref >60)
Glucose, Bld: 121 mg/dL — ABNORMAL HIGH (ref 70–99)
Potassium: 4.7 mmol/L (ref 3.5–5.1)
Sodium: 139 mmol/L (ref 135–145)

## 2023-03-26 LAB — GLUCOSE, CAPILLARY
Glucose-Capillary: 100 mg/dL — ABNORMAL HIGH (ref 70–99)
Glucose-Capillary: 147 mg/dL — ABNORMAL HIGH (ref 70–99)

## 2023-03-26 NOTE — TOC Progression Note (Signed)
Transition of Care [facility]) - Progression Note    Patient Details  Name: Kayla [name] MRN: [id number] Date of Birth: Mar 31, 1941  [facility] CM/SW Contact  Beckie Busing, RN Phone Number:[phone]  03/26/2023, 9:53 AM  Clinical Narrative:    CM received message from MD inquiring if patient can return to SNF today. CM has confirmed with [facility] admissions director that patient is good to return today. MD has been updated.    Expected Discharge Plan: Skilled Nursing Facility Barriers to Discharge: Continued Medical Work up  Expected Discharge Plan and Services In-house Referral: NA Discharge Planning Services: CM Consult Post Acute Care Choice: NA Living arrangements for the past 2 months: Skilled Nursing Facility Expected Discharge Date: 03/26/23               DME Arranged: N/A DME Agency: NA       HH Arranged: NA HH Agency: NA         Social Determinants of Health (SDOH) Interventions SDOH Screenings   Food Insecurity: No Food Insecurity (03/21/2023)  Housing: Low Risk  (03/21/2023)  Transportation Needs: No Transportation Needs (03/21/2023)  Utilities: Not At Risk (03/21/2023)  Depression (PHQ2-9): Low Risk  (04/16/2021)  Financial Resource Strain: Low Risk  (04/16/2021)  Physical Activity: Insufficiently Active (04/16/2021)  Social Connections: Unknown (04/16/2021)  Stress: No Stress Concern Present (04/16/2021)  Tobacco Use: Low Risk  (03/21/2023)    Readmission Risk Interventions    08/29/2021   12:47 PM  Readmission Risk Prevention Plan  Transportation Screening Complete  PCP or Specialist Appt within 5-7 Days Complete  Home Care Screening Not Complete  Medication Review (RN CM) Referral to Pharmacy

## 2023-03-26 NOTE — Discharge Summary (Signed)
Physician Discharge Summary  ANE METHOD MWU:[id number] DOB: [birth year] DOA: 03/21/2023  PCP: Ronnald Nian, MD  Admit date: 03/21/2023 Discharge date: 03/26/2023  Time spent: 36 minutes  Recommendations for Outpatient Follow-up:  Needs to be off of Aldactone for the foreseeable future-is not really a candidate for this given HFpEF and not HFrEF--would use Lasix for diuresis--discharge weight 110 kg-would add Lasix 40 mg daily in the next 3 to 6 days if gains more than 2 to 3 kg at nursing facility Recommend further follow-up in the outpatient setting with cardiology-CC Dr. Raynelle Jan as has been lost to follow-up since 03/2021--- (at some point needed cardiac cath) Obtain Chem-12, CBC in about a week  Discharge Diagnoses:  MAIN problem for hospitalization   Acute hyperkalemia likely secondary to Aldactone use HFpEF compensated DM TY 2  chronic bilateral lower extremity wounds Lumbar impingement syndrome Chronic A-fibPrior PE on anticoagulation  Please see below for itemized issues addressed in HOpsital- refer to other progress notes for clarity if needed  Discharge Condition: Fair  Diet recommendation: Heart healthy fluid restricted  [facility] Weights   03/21/23 1830  Weight: 110.2 kg    History of present illness:  82 year old skilled nursing resident DM TY 2 obesity PE Chronic A-fib on anticoagulation Osteoarthritis with lumbar nerve root impingement previously Chronic bilateral foot wounds  Patient got into a hospital from [facility] with elevated potassium potassium was 6.2 feeling well other than intermittent headache Was treated with [facility] Course:   Acute hyperkalemia Resolved with treatment with [facility] and should probably not be on Aldactone in the future-I do not see documented HFrEF so therapeutic benefit of Aldactone is in question  Well compensated HFpEF Not in distress not requiring oxygen and is stable for discharge No  Aldactone at this time continue only Coreg 12.5 twice daily Needs outpatient cardiology follow-up as lost to follow-up-CC Dr. Raynelle Jan  DM 2 Continue with metformin 500 twice daily Can continue with gabapentin at discharge Will need labs in about a week but is stable for discharge at this time  Atrial fibrillation CHADVASC >3 on DOAC Eliquis 5 twice daily Stable at this time on Coreg  Memory deficit?  Suggest outpatient MMSE-does not remember that she has possible heart failure   Discharge Exam: Vitals:   03/26/23 0534 03/26/23 0827  BP: (!) 159/65 (!) 150/63  Pulse: 70 73  Resp: 16   Temp: (!) 97.5 F (36.4 C)   SpO2: 97%     Subj on day of d/c   Awake coherent pleasant no distress  General Exam on discharge  EOMI NCAT thick neck Mallampati 4 S1-S2 seems to be in sinus Chest is clear no wheeze rales rhonchi Abdomen soft no rebound no guarding Trace lower extremity edema Neuro intact  Discharge Instructions   Discharge Instructions     Call MD for:  difficulty breathing, headache or visual disturbances   Complete by: As directed    Call MD for:  persistant dizziness or light-headedness   Complete by: As directed    Call MD for:  persistant nausea and vomiting   Complete by: As directed    Diet - low sodium heart healthy   Complete by: As directed    Diet Carb Modified   Complete by: As directed    Discharge instructions   Complete by: As directed    Advised to follow-up with primary care physician in 1 week. Advised to discontinue Aldactone and losartan as it caused  hyperkalemia. Will look his PCP to adjust her blood pressure medication.   Discharge patient   Complete by: As directed    Discharge disposition: 03-Skilled Nursing Facility   Discharge patient date: 03/26/2023   Increase activity slowly   Complete by: As directed       Allergies as of 03/26/2023   No Known Allergies      Medication List     STOP taking these medications     losartan 50 MG tablet Commonly known as: COZAAR   spironolactone 50 MG tablet Commonly known as: ALDACTONE       TAKE these medications    acetaminophen 325 MG tablet Commonly known as: TYLENOL Take 650 mg by mouth every 8 (eight) hours as needed (pain).   amLODipine 10 MG tablet Commonly known as: NORVASC Take 10 mg by mouth daily.   apixaban 5 MG Tabs tablet Commonly known as: ELIQUIS Take 5 mg by mouth 2 (two) times daily.   atorvastatin 40 MG tablet Commonly known as: LIPITOR Take 40 mg by mouth at bedtime.   Biofreeze 4 % Gel Generic drug: Menthol (Topical Analgesic) Apply 1 application  topically in the morning, at noon, and at bedtime. Apply thin layer topically to right shoulder and arm   carvedilol 12.5 MG tablet Commonly known as: COREG Take 12.5 mg by mouth 2 (two) times daily with a meal.   cetirizine 10 MG tablet Commonly known as: ZYRTEC Take 10 mg by mouth daily.   Cranberry 450 MG Tabs Take 450 mg by mouth daily.   docusate sodium 100 MG capsule Commonly known as: COLACE Take 100 mg by mouth daily. Take with prune juice   ezetimibe 10 MG tablet Commonly known as: ZETIA Take 10 mg by mouth at bedtime.   ferrous sulfate 325 (65 FE) MG tablet Take 325 mg by mouth every other day.   fluticasone 50 MCG/ACT nasal spray Commonly known as: FLONASE Place 2 sprays into both nostrils in the morning and at bedtime.   gabapentin 100 MG capsule Commonly known as: NEURONTIN Take 1 capsule (100 mg total) by mouth daily. What changed: when to take this   melatonin 5 MG Tabs Take 5 mg by mouth at bedtime as needed (sleep).   metFORMIN 500 MG tablet Commonly known as: GLUCOPHAGE Take 500 mg by mouth in the morning and at bedtime.   montelukast 10 MG tablet Commonly known as: SINGULAIR Take 10 mg by mouth daily.   nitroGLYCERIN 0.4 MG SL tablet Commonly known as: NITROSTAT Take up to 3 tablets What changed:  how much to take when to take  this reasons to take this additional instructions   olopatadine 0.1 % ophthalmic solution Commonly known as: PATANOL Place 2 drops into both eyes daily.   polyethylene glycol 17 g packet Commonly known as: MIRALAX / GLYCOLAX Take 17 g by mouth daily as needed for mild constipation or moderate constipation.   polyethylene glycol 17 g packet Commonly known as: MIRALAX / GLYCOLAX Take 17 g by mouth 2 (two) times a week. Monday and Thursday   Refresh Celluvisc 1 % Gel Generic drug: Carboxymethylcellulose Sod PF Place 1 drop into both eyes in the morning and at bedtime.       No Known Allergies  Follow-up Information     Ronnald Nian, MD Follow up in 1 week(s).   Specialty: Family Medicine Contact information: [address] [place] [address] [phone]  The results of significant diagnostics from this hospitalization (including imaging, microbiology, ancillary and laboratory) are listed below for reference.    Significant Diagnostic Studies: No results found.  Microbiology: No results found for this or any previous visit (from the past 240 hour(s)).   Labs: Basic Metabolic Panel: Recent Labs  Lab 03/21/23 1314 03/22/23 0553 03/22/23 0945 03/22/23 2133 03/23/23 0810 03/23/23 1112 03/24/23 1334 03/25/23 0800  NA 136 135  --   --  136  --  136 136  K 6.4* 5.3*   < > 5.5* 5.1 5.1 5.3* 5.1  CL 108 106  --   --  109  --  107 108  CO2 21* 22  --   --  19*  --  22 24  GLUCOSE 103* 94  --   --  109*  --  165* 105*  BUN 39* 37*  --   --  41*  --  49* 41*  CREATININE 1.17* 1.24*  --   --  1.23*  --  1.37* 1.10*  CALCIUM 8.7* 8.7*  --   --  8.5*  --  8.2* 8.4*   < > = values in this interval not displayed.   Liver Function Tests: No results for input(s): "AST", "ALT", "ALKPHOS", "BILITOT", "PROT", "ALBUMIN" in the last 168 hours. No results for input(s): "LIPASE", "AMYLASE" in the last 168 hours. No results for input(s):  "AMMONIA" in the last 168 hours. CBC: Recent Labs  Lab 03/21/23 1314 03/22/23 0553  WBC 5.4 5.3  HGB 9.6* 9.5*  HCT 31.8* 31.0*  MCV 89.1 89.1  PLT 224 219   Cardiac Enzymes: No results for input(s): "CKTOTAL", "CKMB", "CKMBINDEX", "TROPONINI" in the last 168 hours. BNP: BNP (last 3 results) No results for input(s): "BNP" in the last 8760 hours.  ProBNP (last 3 results) No results for input(s): "PROBNP" in the last 8760 hours.  CBG: Recent Labs  Lab 03/25/23 0718 03/25/23 1341 03/25/23 1632 03/25/23 2101 03/26/23 0710  GLUCAP 113* 221* 211* 104* 100*       Signed:  Rhetta Mura MD   [facility] 03/26/2023, 9:38 AM

## 2023-03-26 NOTE — TOC Transition Note (Addendum)
Transition of Care [facility]) - CM/SW Discharge Note   Patient Details  Name: Kayla [name] MRN: [id number] Date of Birth: 26-Mar-1941  Transition of Care [facility] CM/SW Contact:  Beckie Busing, RN Phone Number:[phone]  03/26/2023, 11:05 AM   Clinical Narrative:    Patient to discharge back to [facility].Discharge summary has been faxed to [facility]. Son Kayla [name] has been updated. Transportation arranged per [employer]. Discharge packet is at nurses station. TOC will sign off.   Please call report to  [facility]  [phone] Rm # [id number]    Final next level of care: Skilled Nursing Facility Barriers to Discharge: No Barriers Identified   Patient Goals and CMS Choice CMS Medicare.gov Compare Post Acute Care list provided to::  (n/a) Choice offered to / list presented to : NA  Discharge Placement                Patient chooses bed at: [facility] (Long term resident at [facility]) Patient to be transferred to facility by: [facility] Name of family member notified: Kayla [name] Patient and family notified of of transfer: 03/26/23  Discharge Plan and Services Additional resources added to the After Visit Summary for   In-house Referral: NA Discharge Planning Services: CM Consult Post Acute Care Choice: NA          DME Arranged: N/A DME Agency:  (n/a)       HH Arranged: NA HH Agency: NA        Social Determinants of Health (SDOH) Interventions SDOH Screenings   Food Insecurity: No Food Insecurity (03/21/2023)  Housing: Low Risk  (03/21/2023)  Transportation Needs: No Transportation Needs (03/21/2023)  Utilities: Not At Risk (03/21/2023)  Depression (PHQ2-9): Low Risk  (04/16/2021)  Financial Resource Strain: Low Risk  (04/16/2021)  Physical Activity: Insufficiently Active (04/16/2021)  Social Connections: Unknown (04/16/2021)  Stress: No Stress Concern Present (04/16/2021)  Tobacco Use: Low Risk  (03/21/2023)     Readmission Risk Interventions    03/26/2023   10:15 AM 08/29/2021    12:47 PM  Readmission Risk Prevention Plan  Transportation Screening  Complete  PCP or Specialist Appt within 5-7 Days Complete Complete  Home Care Screening Complete Not Complete  Medication Review (RN CM) Referral to Pharmacy Referral to Pharmacy

## 2023-03-26 NOTE — Progress Notes (Addendum)
I called [facility] to give report and no one answers. #[phone]. Discharge papers given to [facility], who is taking the patient to [facility].

## 2023-04-17 NOTE — Progress Notes (Unsigned)
Cardiology Office Note    Patient Name: Kayla [name] Date of Encounter: 04/19/2023  Primary Care Provider:  Ronnald Nian, MD Primary Cardiologist:  Christell Constant, MD Primary Electrophysiologist: None   Past Medical History    Past Medical History:  Diagnosis Date   Acute cystitis without hematuria 08/26/2021   Acute metabolic encephalopathy 08/26/2021   Arthritis    Atrial fibrillation, chronic (HCC)    Atrial flutter (HCC) 03/06/2021   COVID-19 virus infection 08/26/2021   Diabetes mellitus    Facial droop 08/26/2021   Hypertension    Lumbar nerve root impingement 06/24/2021   Obesity    Pulmonary embolus (HCC)    Type 2 diabetes mellitus (HCC) 03/05/2021    History of Present Illness  Kayla [name] is a 82 y.o. female with a PMH of CAD s/p NSTEMI, HFpEF, history of chronic AF (on Eliquis) PE, HTN, DM type II, bradycardia, aortic atherosclerosis, morbid obesity, chronic venous insufficiency who presents today for posthospital follow-up.    [name] was seen initially in 2012 by Dr. Herbie Baltimore and is currently followed by Dr. Raynelle Jan.  She is followed by [facility] for history of chronic venous insufficiency.  She was admitted on 02/2021 with new onset AF with RVR and underwent emergent DCCV due to labile BP with conversion to sinus rhythm.  2D echo was completed showing EF of 55 to 60% with mild MVR and patient was placed on Eliquis for [facility].  During admission patient's troponins were elevated at 1300 and NSTEMI was also diagnosed.  She completed a NM stress test that showed ischemia and moderate basal anterior and apex location.  She also underwent a coronary CTA that showed calcium score of 620 with heavy calcified OM1 and ostial lesion with recommendation for LHC.  Results were discussed with the patient and LHC was declined with medical management elected instead.  She was seen by Dr. Raynelle Jan on 03/31/2021 following her hospitalization and reported doing well.  LHC was  discussed multiple times with family electing to continue  conservative management unless new symptoms occurred.  She was recently admitted 03/21/2023 with elevated potassium.  She was treated with Lokelma and Aldactone was discontinued with plan to use Lasix for diuresis.  During today's visit the patient reports that she has been doing well with exception of right shoulder pain travels to her fingers and causes numbness.  Her blood pressure today is controlled at 116/48 and heart rate is 73 bpm.  She reports sodium indiscretions with her diet due to inability to control foods at her SNF.  Fluid volume status is difficult to ascertain due to body habitus based on weights that she is volume up compared to her discharge weight.  She endorsed some mild discomfort and not pain sub sternally relieved spontaneously. She also reported a productive cough with no evidence of fever or chills.  She is completing PT/OT twice a week and ambulates at least 150 feet.  Patient denies chest pain, palpitations, dyspnea, PND, orthopnea, nausea, vomiting, dizziness, syncope, edema, weight gain, or early satiety.   Review of Systems  Please see the history of present illness.    All other systems reviewed and are otherwise negative except as noted above.  Physical Exam    Wt Readings from Last 3 Encounters:  04/19/23 260 lb (117.9 kg)  03/21/23 242 lb 15.2 oz (110.2 kg)  08/16/22 202 lb 13.2 oz (92 kg)   VS: Vitals:   04/19/23 0842  BP: (!) 116/48  Pulse: 73  SpO2: 95%  ,Body mass index is 44.63 kg/m. GEN: Well nourished, well developed in no acute distress Neck: No JVD; No carotid bruits Pulmonary: Clear to auscultation without rales, wheezing or rhonchi  Cardiovascular: Normal rate. Regular rhythm. Normal S1. Normal S2.   Murmurs: There is no murmur.  ABDOMEN: Soft, non-tender, non-distended EXTREMITIES:  No edema; No deformity   EKG/LABS/ Recent Cardiac Studies   ECG personally reviewed by me today  -completed today  Risk Assessment/Calculations:    CHA2DS2-VASc Score = 6   This indicates a 9.7% annual risk of stroke. The patient's score is based upon: CHF History: 1 HTN History: 1 Diabetes History: 1 Stroke History: 0 Vascular Disease History: 0 Age Score: 2 Gender Score: 1         Lab Results  Component Value Date   WBC 5.3 03/22/2023   HGB 9.5 (L) 03/22/2023   HCT 31.0 (L) 03/22/2023   MCV 89.1 03/22/2023   PLT 219 03/22/2023   Lab Results  Component Value Date   CREATININE 1.06 (H) 03/26/2023   BUN 39 (H) 03/26/2023   NA 139 03/26/2023   K 4.7 03/26/2023   CL 106 03/26/2023   CO2 24 03/26/2023   Lab Results  Component Value Date   CHOL 113 06/24/2021   HDL 32 (L) 06/24/2021   LDLCALC 70 06/24/2021   TRIG 57 06/24/2021   CHOLHDL 3.5 06/24/2021    Lab Results  Component Value Date   HGBA1C 7.0 (H) 03/21/2023   Assessment & Plan    1.  Atrial fibrillation: -Today patient is currently rate controlled on carvedilol 12.5 mg twice daily -Patient's most recent creatinine was 1.0 and hemoglobin was 9.5 -Continue Eliquis 5 mg twice daily -CHA2DS2-VASc Score = 6 [CHF History: 1, HTN History: 1, Diabetes History: 1, Stroke History: 0, Vascular Disease History: 0, Age Score: 2, Gender Score: 1].  Therefore, the patient's annual risk of stroke is 9.7 %.      2.  Coronary artery disease: -NM stress test completed in 2022 showing ischemia and intermediate risk with normal EF and coronary CTA completed showing  calcium score of 620 with heavy calcified OM1 and ostial lesion with recommendation for LHC. -Today patient reports some mild substernal discomfort that she feels may be associated with food. -Patient will start Protonix 20 mg daily -Discussion was also made regarding left heart catheterization and patient still would like to avoid procedure at this time. -Continue current GDMT with carvedilol 12.5 mg twice daily, 10 mg daily, as needed Nitrostat  3.   HFpEF: -2D echo completed 2022 showing EF of 55-60% with no RWMA and mild LVH with mild elevated PASP and mild MVR -Today patient reports indiscretions with salt due to food at SNF. -Volume status was difficult to assess due to body habitus but weight has increased from hospital discharge. -Patient will start Lasix 20 mg twice daily x 3 days and then 20 mg as needed for weight gain of 2 pounds in 24 hours or 5 pounds in 1 week. -Continue spironolactone 50 milligrams daily  4.  DM type II: -Patient's last LDL cholesterol was 7.0 -Continue current treatment plan per PCP  5.  History of venous insufficiency: -Currently followed by [facility] -Patient encouraged to elevate extremities when dependent and use compression hose. -Continue Lasix 20 mg as needed  Disposition: Follow-up with Christell Constant, MD or APP in 3 months    Signed, Napoleon Form, Leodis Rains, NP 04/19/2023, 11:15 AM [facility]

## 2023-04-19 ENCOUNTER — Encounter: Payer: Self-pay | Admitting: Nurse Practitioner

## 2023-04-19 ENCOUNTER — Ambulatory Visit: Payer: Medicare PPO | Attending: Nurse Practitioner | Admitting: Nurse Practitioner

## 2023-04-19 VITALS — BP 116/48 | HR 73 | Ht 64.0 in | Wt 260.0 lb

## 2023-04-19 DIAGNOSIS — I5032 Chronic diastolic (congestive) heart failure: Secondary | ICD-10-CM

## 2023-04-19 DIAGNOSIS — I872 Venous insufficiency (chronic) (peripheral): Secondary | ICD-10-CM

## 2023-04-19 DIAGNOSIS — E119 Type 2 diabetes mellitus without complications: Secondary | ICD-10-CM | POA: Diagnosis not present

## 2023-04-19 DIAGNOSIS — I480 Paroxysmal atrial fibrillation: Secondary | ICD-10-CM | POA: Diagnosis not present

## 2023-04-19 DIAGNOSIS — I2510 Atherosclerotic heart disease of native coronary artery without angina pectoris: Secondary | ICD-10-CM | POA: Diagnosis not present

## 2023-04-19 MED ORDER — PANTOPRAZOLE SODIUM 20 MG PO TBEC: 20.0000 mg | DELAYED_RELEASE_TABLET | Freq: Every day | ORAL | 4 refills | Status: AC

## 2023-04-19 NOTE — Patient Instructions (Addendum)
Medication Instructions:  TAKE LASIX 20MG  TWICE DAILY X3 DAYS THEN TAKE AS NEEDED FOR WEIGHT GAIN >2 DAILY OR >5 POUNDS WEEKLY START PROTONIX 20MG  DAILY *If you need a refill on your cardiac medications before your next appointment, please call your pharmacy*  Lab Work: BMET AND BNP TODAY  If you have labs (blood work) drawn today and your tests are completely normal, you will receive your results only by: MyChart Message (if you have MyChart) OR  A paper copy in the mail If you have any lab test that is abnormal or we need to change your treatment, we will call you to review the results.  Follow-Up: At [facility], you and your health needs are our priority.  As part of our continuing mission to provide you with exceptional heart care, we have created designated Provider Care Teams.  These Care Teams include your primary Cardiologist (physician) and Advanced Practice Providers (APPs -  Physician Assistants and Nurse Practitioners) who all work together to provide you with the care you need, when you need it.  We recommend signing up for the patient portal called "MyChart".  Sign up information is provided on this After Visit Summary.  MyChart is used to connect with patients for Virtual Visits (Telemedicine).  Patients are able to view lab/test results, encounter notes, upcoming appointments, etc.  Non-urgent messages can be sent to your provider as well.   To learn more about what you can do with MyChart, go to [email or web address].    Your next appointment:   3 month(s)  Provider:   Christell Constant, MD  or Robin Searing, NP         Other Instructions

## 2023-04-20 LAB — BASIC METABOLIC PANEL
BUN/Creatinine Ratio: 24 (ref 12–28)
BUN: 25 mg/dL (ref 8–27)
CO2: 25 mmol/L (ref 20–29)
Calcium: 9 mg/dL (ref 8.7–10.3)
Chloride: 106 mmol/L (ref 96–106)
Creatinine, Ser: 1.03 mg/dL — ABNORMAL HIGH (ref 0.57–1.00)
Glucose: 90 mg/dL (ref 70–99)
Potassium: 4.9 mmol/L (ref 3.5–5.2)
Sodium: 142 mmol/L (ref 134–144)
eGFR: 54 mL/min/{1.73_m2} — ABNORMAL LOW (ref >59)

## 2023-04-20 LAB — MAGNESIUM: Magnesium: 1.9 mg/dL (ref 1.6–2.3)

## 2023-08-18 LAB — HEMOGLOBIN A1C: A1c: 7.3

## 2023-11-07 LAB — LAB REPORT - SCANNED
A1c: 8.1
EGFR: 47

## 2023-12-14 LAB — BASIC METABOLIC PANEL WITH GFR: EGFR: 45

## 2023-12-15 LAB — BASIC METABOLIC PANEL WITH GFR: EGFR: 44

## 2023-12-17 ENCOUNTER — Other Ambulatory Visit (HOSPITAL_COMMUNITY): Payer: Self-pay | Admitting: Internal Medicine

## 2023-12-17 ENCOUNTER — Ambulatory Visit: Admitting: Student in an Organized Health Care Education/Training Program

## 2023-12-17 DIAGNOSIS — I509 Heart failure, unspecified: Secondary | ICD-10-CM

## 2023-12-22 LAB — BASIC METABOLIC PANEL WITH GFR: EGFR: 40

## 2024-01-11 ENCOUNTER — Encounter: Payer: Self-pay | Admitting: Internal Medicine

## 2024-01-11 NOTE — Telephone Encounter (Signed)
 Error

## 2024-01-14 ENCOUNTER — Encounter: Payer: Self-pay | Admitting: Student in an Organized Health Care Education/Training Program

## 2024-01-14 ENCOUNTER — Ambulatory Visit (INDEPENDENT_AMBULATORY_CARE_PROVIDER_SITE_OTHER): Admitting: Student in an Organized Health Care Education/Training Program

## 2024-01-14 VITALS — BP 120/72 | HR 80 | Temp 96.9°F | Ht 64.0 in | Wt 275.0 lb

## 2024-01-14 DIAGNOSIS — R053 Chronic cough: Secondary | ICD-10-CM

## 2024-01-14 DIAGNOSIS — Z87891 Personal history of nicotine dependence: Secondary | ICD-10-CM | POA: Diagnosis not present

## 2024-01-14 DIAGNOSIS — R0602 Shortness of breath: Secondary | ICD-10-CM

## 2024-01-14 MED ORDER — FLUTICASONE-SALMETEROL 250-50 MCG/ACT IN AEPB: 1.0000 | INHALATION_SPRAY | Freq: Two times a day (BID) | RESPIRATORY_TRACT | 12 refills | Status: AC

## 2024-01-14 NOTE — Progress Notes (Unsigned)
 Synopsis: Referred in *** by Watson Hacking, MD  Assessment & Plan:   1. Shortness of breath 2. Chronic cough (Primary)  Unable to perform FENO in clinic. Lungs clear on exam. Suspect reflux vs asthma. Will increase ICS dose to 250. Get PFT's, swallowing study ,and chest CT  - Pulmonary Function Test; Future - CT CHEST WO CONTRAST; Future - DG ESOPHAGUS W DOUBLE CM (HD); Future - fluticasone -salmeterol (WIXELA INHUB) 250-50 MCG/ACT AEPB; Inhale 1 puff into the lungs in the morning and at bedtime.  Dispense: 60 each; Refill: 12   Return in about 2 months (around 03/15/2024).  I spent *** minutes caring for this patient today, including {EM billing:[id number]}  Vergia Glasgow, MD [facility] 01/14/2024 9:54 AM    End of visit medications:  Meds ordered this encounter  Medications   fluticasone -salmeterol (WIXELA INHUB) 250-50 MCG/ACT AEPB    Sig: Inhale 1 puff into the lungs in the morning and at bedtime.    Dispense:  60 each    Refill:  12     Current Outpatient Medications:    acetaminophen  (TYLENOL ) 325 MG tablet, Take 650 mg by mouth every 8 (eight) hours as needed (pain)., Disp: , Rfl:    amLODipine  (NORVASC ) 5 MG tablet, Take 5 mg by mouth daily., Disp: , Rfl:    apixaban  (ELIQUIS ) 5 MG TABS tablet, Take 5 mg by mouth 2 (two) times daily., Disp: , Rfl:    Carboxymethylcellulose Sod PF (REFRESH CELLUVISC) 1 % GEL, Place 1 drop into both eyes in the morning and at bedtime., Disp: , Rfl:    carvedilol  (COREG ) 12.5 MG tablet, Take 12.5 mg by mouth 2 (two) times daily with a meal., Disp: , Rfl:    Cranberry 450 MG TABS, Take 450 mg by mouth daily., Disp: , Rfl:    docusate sodium  (COLACE) 100 MG capsule, Take 100 mg by mouth daily. Take with prune juice, Disp: , Rfl:    ezetimibe  (ZETIA ) 10 MG tablet, Take 10 mg by mouth at bedtime., Disp: , Rfl:    ferrous sulfate  325 (65 FE) MG tablet, Take 325 mg by mouth every other day., Disp: , Rfl:    fluticasone   (FLONASE ) 50 MCG/ACT nasal spray, Place 2 sprays into both nostrils in the morning and at bedtime., Disp: , Rfl:    fluticasone -salmeterol (WIXELA INHUB) 250-50 MCG/ACT AEPB, Inhale 1 puff into the lungs in the morning and at bedtime., Disp: 60 each, Rfl: 12   gabapentin  (NEURONTIN ) 100 MG capsule, Take 1 capsule (100 mg total) by mouth daily. (Patient taking differently: Take 100 mg by mouth 3 (three) times daily.), Disp: 30 capsule, Rfl: 0   Ipratropium-Albuterol  (COMBIVENT RESPIMAT) 20-100 MCG/ACT AERS respimat, Inhale 1 puff into the lungs in the morning and at bedtime., Disp: , Rfl:    JARDIANCE 10 MG TABS tablet, Take 10 mg by mouth daily., Disp: , Rfl:    lidocaine  4 %, Place 1 patch onto the skin daily., Disp: , Rfl:    losartan  (COZAAR ) 50 MG tablet, Take 50 mg by mouth daily., Disp: , Rfl:    Menthol , Topical Analgesic, (BIOFREEZE) 4 % GEL, Apply 1 application  topically in the morning, at noon, and at bedtime. Apply thin layer topically to right shoulder and arm, Disp: , Rfl:    montelukast  (SINGULAIR ) 10 MG tablet, Take 10 mg by mouth daily., Disp: , Rfl:    MOUNJARO 5 MG/0.5ML Pen, Inject 5 mg into the skin once a  week., Disp: , Rfl:    nitroGLYCERIN  (NITROSTAT ) 0.4 MG SL tablet, Take up to 3 tablets (Patient taking differently: 0.4 mg every 5 (five) minutes as needed for chest pain (max 3 tablets).), Disp: 25 tablet, Rfl: 3   olopatadine (PATANOL) 0.1 % ophthalmic solution, Place 2 drops into both eyes daily., Disp: , Rfl:    ondansetron  (ZOFRAN -ODT) 4 MG disintegrating tablet, Take 4 mg by mouth every 6 (six) hours as needed., Disp: , Rfl:    pantoprazole  (PROTONIX ) 20 MG tablet, Take 1 tablet (20 mg total) by mouth daily., Disp: 30 tablet, Rfl: 4   polyethylene glycol (MIRALAX  / GLYCOLAX ) 17 g packet, Take 17 g by mouth daily as needed for mild constipation or moderate constipation., Disp: , Rfl:    polyethylene glycol (MIRALAX  / GLYCOLAX ) 17 g packet, Take 17 g by mouth 2 (two) times  a week. Monday and Thursday, Disp: , Rfl:    torsemide (DEMADEX) 20 MG tablet, Take 40 mg by mouth once., Disp: , Rfl:    atorvastatin  (LIPITOR ) 40 MG tablet, Take 40 mg by mouth at bedtime. (Patient not taking: Reported on 01/14/2024), Disp: , Rfl:    cetirizine (ZYRTEC) 10 MG tablet, Take 10 mg by mouth daily. (Patient not taking: Reported on 01/14/2024), Disp: , Rfl:    melatonin 5 MG TABS, Take 5 mg by mouth at bedtime as needed (sleep). (Patient not taking: Reported on 01/14/2024), Disp: , Rfl:    metFORMIN  (GLUCOPHAGE ) 500 MG tablet, Take 500 mg by mouth in the morning and at bedtime. (Patient not taking: Reported on 01/14/2024), Disp: , Rfl:    spironolactone (ALDACTONE) 50 MG tablet, Take 50 mg by mouth daily. Pt takes 1 tablet once daily. (Patient not taking: Reported on 01/14/2024), Disp: , Rfl:    Subjective:   PATIENT ID: Kayla [name] GENDER: female DOB: 1940/11/16, MRN: [id number]  Chief Complaint  Patient presents with   Consult    Cough with yellow sputum. Wheezing. Has been told she had bronchitis.     HPI  63, history of recurrent bronchitis growing up, also smoked for 20 years (18 to 38), 0.5 to 1 ppd. Poor historian, but does report bronchitis growing up. Comes in for cough, productive of sputum (yellowish). No fevers, no other symptoms, not short of breath but is also wheel chair bound. Cough worse at night. No pain reported. No other symptoms. Report of being on advair but this is not clear, also on eliquis  for afib (also report of PE previously)  Ancillary information including prior medications, full medical/surgical/family/social histories, and PFTs (when available) are listed below and have been reviewed.   ROS   Objective:   Vitals:   01/14/24 0909  BP: 120/72  Pulse: 80  Temp: (!) 96.9 F (36.1 C)  SpO2: 92%  Weight: 275 lb (124.7 kg)  Height: 5' 4 (1.626 m)   92% on *** LPM *** RA BMI Readings from Last 3 Encounters:  01/14/24 47.20 kg/m  04/19/23  44.63 kg/m  03/21/23 41.70 kg/m   Wt Readings from Last 3 Encounters:  01/14/24 275 lb (124.7 kg)  04/19/23 260 lb (117.9 kg)  03/21/23 242 lb 15.2 oz (110.2 kg)    Physical Exam    Ancillary Information    Past Medical History:  Diagnosis Date   Acute cystitis without hematuria 08/26/2021   Acute metabolic encephalopathy 08/26/2021   Arthritis    Atrial fibrillation, chronic (HCC)    Atrial flutter (HCC) 03/06/2021   COVID-19  virus infection 08/26/2021   Diabetes mellitus    Facial droop 08/26/2021   Hypertension    Lumbar nerve root impingement 06/24/2021   Obesity    Pulmonary embolus (HCC)    Type 2 diabetes mellitus (HCC) 03/05/2021     Family History  Problem Relation Age of Onset   Hypertension Mother      Past Surgical History:  Procedure Laterality Date   ABDOMINAL HYSTERECTOMY     CHOLECYSTECTOMY     JOINT REPLACEMENT  Right    Social History   Socioeconomic History   Marital status: Widowed    Spouse name: Not on file   Number of children: Not on file   Years of education: Not on file   Highest education level: Not on file  Occupational History   Occupation: retired  Tobacco Use   Smoking status: Former    Current packs/day: 0.00    Types: Cigarettes    Quit date: 1990    Years since quitting: 35.4   Smokeless tobacco: Never   Tobacco comments:    Started smoking in her 89's.    Smoked 1 PPD at her heaviest.    Quit smoking around 45.  Vaping Use   Vaping status: Never Used  Substance and Sexual Activity   Alcohol  use: No   Drug use: No   Sexual activity: Not Currently  Other Topics Concern   Not on file  Social History Narrative   Not on file   Social Drivers of Health   Financial Resource Strain: Low Risk  (04/16/2021)   Overall Financial Resource Strain (CARDIA)    Difficulty of Paying Living Expenses: Not hard at all  Food Insecurity: No Food Insecurity (03/21/2023)   Hunger Vital Sign    Worried About Running Out of Food in  the Last Year: Never true    Ran Out of Food in the Last Year: Never true  Transportation Needs: No Transportation Needs (03/21/2023)   PRAPARE - Administrator, Civil Service (Medical): No    Lack of Transportation (Non-Medical): No  Physical Activity: Insufficiently Active (04/16/2021)   Exercise Vital Sign    Days of Exercise per Week: 3 days    Minutes of Exercise per Session: 30 min  Stress: No Stress Concern Present (04/16/2021)   [employer] of Occupational Health - Occupational Stress Questionnaire    Feeling of Stress : Not at all  Social Connections: Unknown (04/16/2021)   Social Connection and Isolation Panel    Frequency of Communication with Friends and Family: More than three times a week    Frequency of Social Gatherings with Friends and Family: More than three times a week    Attends Religious Services: Not on file    Active Member of Clubs or Organizations: No    Attends Banker Meetings: Never    Marital Status: Widowed  Intimate Partner Violence: Not At Risk (03/21/2023)   Humiliation, Afraid, Rape, and Kick questionnaire    Fear of Current or Ex-Partner: No    Emotionally Abused: No    Physically Abused: No    Sexually Abused: No     No Known Allergies   CBC    Component Value Date/Time   WBC 5.3 03/22/2023 0553   RBC 3.48 (L) 03/22/2023 0553   HGB 9.5 (L) 03/22/2023 0553   HGB 10.3 (L) 06/01/2020 1456   HCT 31.0 (L) 03/22/2023 0553   HCT 32.8 (L) 06/01/2020 1456   PLT 219 03/22/2023 0553  PLT 217 06/01/2020 1456   MCV 89.1 03/22/2023 0553   MCV 84 06/01/2020 1456   MCH 27.3 03/22/2023 0553   MCHC 30.6 03/22/2023 0553   RDW 14.5 03/22/2023 0553   RDW 13.8 06/01/2020 1456   LYMPHSABS 1.9 11/22/2022 0149   LYMPHSABS 1.3 06/01/2020 1456   MONOABS 0.3 11/22/2022 0149   EOSABS 0.3 11/22/2022 0149   EOSABS 0.2 06/01/2020 1456   BASOSABS 0.0 11/22/2022 0149   BASOSABS 0.1 06/01/2020 1456    Pulmonary Functions Testing  Results:     No data to display          Outpatient Medications Prior to Visit  Medication Sig Dispense Refill   acetaminophen  (TYLENOL ) 325 MG tablet Take 650 mg by mouth every 8 (eight) hours as needed (pain).     amLODipine  (NORVASC ) 5 MG tablet Take 5 mg by mouth daily.     apixaban  (ELIQUIS ) 5 MG TABS tablet Take 5 mg by mouth 2 (two) times daily.     Carboxymethylcellulose Sod PF (REFRESH CELLUVISC) 1 % GEL Place 1 drop into both eyes in the morning and at bedtime.     carvedilol  (COREG ) 12.5 MG tablet Take 12.5 mg by mouth 2 (two) times daily with a meal.     Cranberry 450 MG TABS Take 450 mg by mouth daily.     docusate sodium  (COLACE) 100 MG capsule Take 100 mg by mouth daily. Take with prune juice     ezetimibe  (ZETIA ) 10 MG tablet Take 10 mg by mouth at bedtime.     ferrous sulfate  325 (65 FE) MG tablet Take 325 mg by mouth every other day.     fluticasone  (FLONASE ) 50 MCG/ACT nasal spray Place 2 sprays into both nostrils in the morning and at bedtime.     gabapentin  (NEURONTIN ) 100 MG capsule Take 1 capsule (100 mg total) by mouth daily. (Patient taking differently: Take 100 mg by mouth 3 (three) times daily.) 30 capsule 0   Ipratropium-Albuterol  (COMBIVENT RESPIMAT) 20-100 MCG/ACT AERS respimat Inhale 1 puff into the lungs in the morning and at bedtime.     JARDIANCE 10 MG TABS tablet Take 10 mg by mouth daily.     lidocaine  4 % Place 1 patch onto the skin daily.     losartan  (COZAAR ) 50 MG tablet Take 50 mg by mouth daily.     Menthol , Topical Analgesic, (BIOFREEZE) 4 % GEL Apply 1 application  topically in the morning, at noon, and at bedtime. Apply thin layer topically to right shoulder and arm     montelukast  (SINGULAIR ) 10 MG tablet Take 10 mg by mouth daily.     MOUNJARO 5 MG/0.5ML Pen Inject 5 mg into the skin once a week.     nitroGLYCERIN  (NITROSTAT ) 0.4 MG SL tablet Take up to 3 tablets (Patient taking differently: 0.4 mg every 5 (five) minutes as needed for chest  pain (max 3 tablets).) 25 tablet 3   olopatadine (PATANOL) 0.1 % ophthalmic solution Place 2 drops into both eyes daily.     ondansetron  (ZOFRAN -ODT) 4 MG disintegrating tablet Take 4 mg by mouth every 6 (six) hours as needed.     pantoprazole  (PROTONIX ) 20 MG tablet Take 1 tablet (20 mg total) by mouth daily. 30 tablet 4   polyethylene glycol (MIRALAX  / GLYCOLAX ) 17 g packet Take 17 g by mouth daily as needed for mild constipation or moderate constipation.     polyethylene glycol (MIRALAX  / GLYCOLAX ) 17 g packet Take 17  g by mouth 2 (two) times a week. Monday and Thursday     torsemide (DEMADEX) 20 MG tablet Take 40 mg by mouth once.     fluticasone -salmeterol (ADVAIR) 100-50 MCG/ACT AEPB Inhale 1 puff into the lungs 2 (two) times daily.     atorvastatin  (LIPITOR ) 40 MG tablet Take 40 mg by mouth at bedtime. (Patient not taking: Reported on 01/14/2024)     cetirizine (ZYRTEC) 10 MG tablet Take 10 mg by mouth daily. (Patient not taking: Reported on 01/14/2024)     melatonin 5 MG TABS Take 5 mg by mouth at bedtime as needed (sleep). (Patient not taking: Reported on 01/14/2024)     metFORMIN  (GLUCOPHAGE ) 500 MG tablet Take 500 mg by mouth in the morning and at bedtime. (Patient not taking: Reported on 01/14/2024)     spironolactone (ALDACTONE) 50 MG tablet Take 50 mg by mouth daily. Pt takes 1 tablet once daily. (Patient not taking: Reported on 01/14/2024)     amLODipine  (NORVASC ) 10 MG tablet Take 10 mg by mouth daily. (Patient not taking: Reported on 01/14/2024)     No facility-administered medications prior to visit.

## 2024-01-15 ENCOUNTER — Ambulatory Visit: Admitting: Cardiology

## 2024-01-24 ENCOUNTER — Other Ambulatory Visit (HOSPITAL_COMMUNITY)

## 2024-01-30 ENCOUNTER — Telehealth: Payer: Self-pay

## 2024-01-30 NOTE — Telephone Encounter (Signed)
 Copied from CRM [id number]. Topic: Referral - Question >> Jan 30, 2024  3:17 PM Russell PARAS wrote: Reason for CRM:   Harlene, with [facility], is contacting clinic regarding echocardiogram that is scheduled for 07/03 at 2 PM. The hospital performing the test contacted pt advising she would need PA for the procedure to be covered by insurance. Provided provider NPI, Hospital Tax ID to [facility] to help process referral faster.   Harlene is requesting more information from provider, concerning why she requires the echocardiogram to enable it to be covered by insurance. Needs this information as soon as possible due to testing being tomm.   Information can be sent to FX# [phone], make Attn: Harlene

## 2024-01-31 ENCOUNTER — Ambulatory Visit (HOSPITAL_COMMUNITY)
Admission: RE | Admit: 2024-01-31 | Discharge: 2024-01-31 | Disposition: A | Discharge status: Home / Self Care | Source: Ambulatory Visit | Attending: Internal Medicine | Admitting: Internal Medicine

## 2024-01-31 DIAGNOSIS — Z86711 Personal history of pulmonary embolism: Secondary | ICD-10-CM | POA: Insufficient documentation

## 2024-01-31 DIAGNOSIS — I082 Rheumatic disorders of both aortic and tricuspid valves: Secondary | ICD-10-CM | POA: Insufficient documentation

## 2024-01-31 DIAGNOSIS — I4891 Unspecified atrial fibrillation: Secondary | ICD-10-CM | POA: Diagnosis not present

## 2024-01-31 DIAGNOSIS — I110 Hypertensive heart disease with heart failure: Secondary | ICD-10-CM | POA: Diagnosis not present

## 2024-01-31 DIAGNOSIS — E119 Type 2 diabetes mellitus without complications: Secondary | ICD-10-CM | POA: Insufficient documentation

## 2024-01-31 DIAGNOSIS — I5040 Unspecified combined systolic (congestive) and diastolic (congestive) heart failure: Secondary | ICD-10-CM | POA: Diagnosis present

## 2024-01-31 DIAGNOSIS — R9431 Abnormal electrocardiogram [ECG] [EKG]: Secondary | ICD-10-CM | POA: Insufficient documentation

## 2024-01-31 DIAGNOSIS — I509 Heart failure, unspecified: Secondary | ICD-10-CM | POA: Diagnosis not present

## 2024-01-31 LAB — ECHOCARDIOGRAM COMPLETE
Area-P 1/2: 2.53 cm2
Calc EF: 65.5 %
S' Lateral: 2.7 cm
Single Plane A2C EF: 63.7 %
Single Plane A4C EF: 72 %

## 2024-01-31 NOTE — Progress Notes (Signed)
  Echocardiogram 2D Echocardiogram has been performed.  Kayla [name] 01/31/2024, 2:54 PM

## 2024-02-05 ENCOUNTER — Other Ambulatory Visit: Payer: Self-pay | Admitting: Student in an Organized Health Care Education/Training Program

## 2024-02-05 ENCOUNTER — Ambulatory Visit (HOSPITAL_COMMUNITY)
Admission: RE | Admit: 2024-02-05 | Discharge: 2024-02-05 | Disposition: A | Discharge status: Home / Self Care | Source: Ambulatory Visit | Attending: Student in an Organized Health Care Education/Training Program | Admitting: Student in an Organized Health Care Education/Training Program

## 2024-02-05 DIAGNOSIS — R0602 Shortness of breath: Secondary | ICD-10-CM

## 2024-02-05 DIAGNOSIS — R053 Chronic cough: Secondary | ICD-10-CM | POA: Insufficient documentation

## 2024-02-05 NOTE — Telephone Encounter (Signed)
 Noted. I do not have a number to call Jessica back at. Looks like the patient did have the Echo done.  Closing this encounter.

## 2024-02-27 NOTE — Progress Notes (Signed)
 Cardiology Office Note    Date:  03/01/2024  ID:  Kayla, [name] 1941/01/08, MRN [id number] PCP:  Joyce Norleen BROCKS, MD  Cardiologist:  Stanly DELENA Leavens, MD  Electrophysiologist:  None   Chief Complaint: Follow up for CAD   History of Present Illness: .    Kayla [name] is a 83 y.o. female with visit-pertinent history of CAD s/p NSTEMI, HFpEF, history of chronic A-fib on Eliquis , PE, hypertension, type 2 diabetes mellitus, bradycardia, aortic atherosclerosis, morbid obesity, chronic venous insufficiency.  Patient was initially seen in 2012 by Dr. Anner and is currently followed by Dr. Arnetha.  She is also followed by [facility] for history of chronic venous insufficiency.  In 02/2021 patient was admitted with new onset A-fib with RVR and underwent emergent DCCV due to labile BP with conversion to sinus rhythm.  2D echo was completed showing EF of 55 to 60% with mild MVR and patient was started on Eliquis  for [facility].  During admission patient's troponins were elevated at to 1300 and NSTEMI was also diagnosed.  She completed an NM stress test that showed ischemia and moderate basal anterior and apex location.  Coronary CTA showed calcium  score of 620 with heavily calcified OM1 and ostial lesion with recommendation for LHC.  Results were discussed with patient and LHC was declined with medical management instead.  Patient was seen in follow-up and reported doing well.  LHC was discussed multiple times with family electing to continue conservative management unless new symptoms occur.  Patient was admitted on 03/21/2023 with elevated potassium, she was treated with Lokelma  and Aldactone was discontinued plan to use Lasix  for diuretic.  Patient was last in the clinic on 04/19/2023 by Jackee Alberts, NP.  Patient reported that she had been doing well with the exception of right shoulder pain that traveled to her fingers and caused numbness.  Blood pressure was well-controlled at office visit and heart  rate was 73 bpm.  Patient reported sodium indiscretion with diet due to inability control foods at her [facility].  Fluid on status was difficult to ascertain due to body habitus based on weights she is volume up compared to her discharge weight.  On 01/14/2024 patient was seen by pulmonology for evaluation of chronic cough, with report of being on Advair.  On chart review patient had echocardiogram completed on 01/31/2024 which indicated LVEF 65 to 70%, no RWMA, mild concentric LVH, grade 1 diastolic dysfunction, RV cyst dysfunction was mildly reduced, RV mildly enlarged, moderately elevated pulmonary artery systolic pressures, no evidence of mitral valve regurgitation or stenosis, mild calcification of the aortic valve no stenosis or regurgitation was present.  Today she presents for follow-up.  Patient presents alone. She reports that she has been doing well overall. She reports she has been started on nebulizer, she reports her cough has completely resolved and her breathing has significantly improved. She denies chest pain, shortness of breath, worsening lower extremity edema, orthopnea or PND.  Patient reports that she is currently not walking as she injured her right shoulder using her wheelchair, reports that prior to this she was regularly walking with physical therapy using a walker.  Patient reports that she is doing very well today, will prefer not to make any changes at this time given that her breathing has significantly improved with her nebulizer treatments.She reports that in recent weeks she has regular been taking her diuretics that have also improved her breathing, notes that she may have skipped a few doses as  a result of increased urination.  ROS: .   Today she denies chest pain, shortness of breath, lower extremity edema, fatigue, palpitations, melena, hematuria, hemoptysis, diaphoresis, weakness, presyncope, syncope, orthopnea, and PND.  All other systems are reviewed and otherwise  negative. Studies Reviewed: SABRA   EKG:  EKG is ordered today, personally reviewed, demonstrating  EKG Interpretation Date/Time:  Friday February 29 2024 13:30:46 EDT Ventricular Rate:  71 PR Interval:  160 QRS Duration:  82 QT Interval:  422 QTC Calculation: 458 R Axis:   -8  Text Interpretation: Normal sinus rhythm Minimal voltage criteria for LVH, may be normal variant ( R in aVL ) Confirmed by Aamna Mallozzi [phone]) on 02/29/2024 6:21:23 PM   CV Studies: Cardiac studies reviewed are outlined and summarized above. Otherwise please see EMR for full report. Cardiac Studies & Procedures   ______________________________________________________________________________________________   STRESS TESTS  NM MYOCAR MULTI W/SPECT W 03/06/2021  Narrative  There was no ST segment deviation noted during stress.  Findings consistent with ischemia.  This is an intermediate risk study.  The left ventricular ejection fraction is hyperdynamic (>65%).  Nuclear stress EF: 66%.  There is a medium size defect of mild severity present in the basal anterior, mid anterior, apical anterior and apex location. Grossly normal wall motion. LVEF 66%. Findings suggest ischemia in LAD distribution. Intermediate risk study due to TID 1.58 and medium size defect.   ECHOCARDIOGRAM  ECHOCARDIOGRAM COMPLETE 01/31/2024  Narrative ECHOCARDIOGRAM REPORT    Patient Name:   Kayla [name] Date of Exam: 01/31/2024 Medical Rec #:  [id number]       Height:       64.0 in Accession #:    [id number]      Weight:       275.0 lb Date of Birth:  1940/09/02        BSA:          2.240 m Patient Age:    83 years        BP:           159/78 mmHg Patient Gender: F               HR:           76 bpm. Exam Location:  Outpatient  Procedure: 2D Echo, 3D Echo, Cardiac Doppler, Color Doppler and Strain Analysis (Both Spectral and Color Flow Doppler were utilized during procedure).  Indications:    I50.40* Unspecified combined systolic  (congestive) and diastolic (congestive) heart failure  History:        Patient has prior history of Echocardiogram examinations, most recent 03/05/2021. Abnormal ECG, Arrythmias:Atrial Fibrillation and Atrial Flutter, Signs/Symptoms:Edema; Risk Factors:Hypertension and Diabetes. Pulmonary embolus.  Sonographer:    Ellouise Mose RDCS Referring Phys: JUNETTE HERO [facility]   Sonographer Comments: Technically difficult study due to poor echo windows and patient is obese. Image acquisition challenging due to patient body habitus. IMPRESSIONS   1. Left ventricular ejection fraction, by estimation, is 65 to 70%. Left ventricular ejection fraction by 3D volume is 65 %. The left ventricle has normal function. The left ventricle has no regional wall motion abnormalities. There is mild concentric left ventricular hypertrophy. Left ventricular diastolic parameters are consistent with Grade I diastolic dysfunction (impaired relaxation). The global longitudinal strain is normal. 2. Right ventricular systolic function is mildly reduced. The right ventricular size is mildly enlarged. There is moderately elevated pulmonary artery systolic pressure. The estimated right ventricular systolic pressure is 37.6 mmHg. 3. The mitral  valve is degenerative. No evidence of mitral valve regurgitation. No evidence of mitral stenosis. 4. The aortic valve is tricuspid. There is mild calcification of the aortic valve. Aortic valve regurgitation is not visualized. No aortic stenosis is present.  FINDINGS Left Ventricle: Left ventricular ejection fraction, by estimation, is 65 to 70%. Left ventricular ejection fraction by 3D volume is 65 %. The left ventricle has normal function. The left ventricle has no regional wall motion abnormalities. Strain was performed and the global longitudinal strain is normal. The left ventricular internal cavity size was normal in size. There is mild concentric left ventricular hypertrophy. Left ventricular  diastolic parameters are consistent with Grade I diastolic dysfunction (impaired relaxation).  Right Ventricle: The right ventricular size is mildly enlarged. No increase in right ventricular wall thickness. Right ventricular systolic function is mildly reduced. There is moderately elevated pulmonary artery systolic pressure. The tricuspid regurgitant velocity is 2.72 m/s, and with an assumed right atrial pressure of 8 mmHg, the estimated right ventricular systolic pressure is 37.6 mmHg.  Left Atrium: Left atrial size was normal in size.  Right Atrium: Right atrial size was normal in size.  Pericardium: There is no evidence of pericardial effusion.  Mitral Valve: The mitral valve is degenerative in appearance. There is moderate thickening of the mitral valve leaflet(s). There is moderate calcification of the anterior mitral valve leaflet(s). Normal mobility of the mitral valve leaflets. No evidence of mitral valve regurgitation. No evidence of mitral valve stenosis.  Tricuspid Valve: The tricuspid valve is not well visualized. Tricuspid valve regurgitation is mild . No evidence of tricuspid stenosis.  Aortic Valve: The aortic valve is tricuspid. There is mild calcification of the aortic valve. There is mild aortic valve annular calcification. Aortic valve regurgitation is not visualized. No aortic stenosis is present.  Pulmonic Valve: The pulmonic valve was normal in structure. Pulmonic valve regurgitation is not visualized. No evidence of pulmonic stenosis.  Aorta: The aortic root and ascending aorta are structurally normal, with no evidence of dilitation.  IAS/Shunts: No atrial level shunt detected by color flow Doppler.  Additional Comments: 3D was performed not requiring image post processing on an independent workstation and was normal.   LEFT VENTRICLE PLAX 2D LVIDd:         3.90 cm         Diastology LVIDs:         2.70 cm         LV e' medial:    4.46 cm/s LV PW:         1.10  cm         LV E/e' medial:  10.5 LV IVS:        1.30 cm         LV e' lateral:   7.51 cm/s LVOT diam:     2.20 cm         LV E/e' lateral: 6.3 LV SV:         77 LV SV Index:   34 LVOT Area:     3.80 cm        3D Volume EF LV 3D EF:    Left ventricul LV Volumes (MOD)                            ar LV vol d, MOD    41.9 ml  ejection A2C:                                        fraction LV vol d, MOD    61.5 ml                    by 3D A4C:                                        volume is LV vol s, MOD    15.2 ml                    65 %. A2C: LV vol s, MOD    17.2 ml A4C:                           3D Volume EF: LV SV MOD A2C:   26.7 ml       3D EF:        65 % LV SV MOD A4C:   61.5 ml       LV EDV:       136 ml LV SV MOD BP:    33.7 ml       LV ESV:       48 ml LV SV:        89 ml  RIGHT VENTRICLE             IVC RV S prime:     15.90 cm/s  IVC diam: 1.30 cm TAPSE (M-mode): 2.0 cm  LEFT ATRIUM             Index        RIGHT ATRIUM          Index LA diam:        3.30 cm 1.47 cm/m   RA Area:     9.70 cm LA Vol (A2C):   13.4 ml 5.98 ml/m   RA Volume:   18.60 ml 8.30 ml/m LA Vol (A4C):   32.6 ml 14.55 ml/m LA Biplane Vol: 21.1 ml 9.42 ml/m AORTIC VALVE LVOT Vmax:   94.00 cm/s LVOT Vmean:  60.300 cm/s LVOT VTI:    0.203 m  AORTA Ao Root diam: 2.90 cm Ao Asc diam:  3.10 cm  MITRAL VALVE               TRICUSPID VALVE MV Area (PHT): 2.53 cm    TR Peak grad:   29.6 mmHg MV Decel Time: 300 msec    TR Vmax:        272.00 cm/s MV E velocity: 47.00 cm/s MV A velocity: 89.75 cm/s  SHUNTS MV E/A ratio:  0.52        Systemic VTI:  0.20 m Systemic Diam: 2.20 cm  Aditya Sabharwal Electronically signed by Ria Commander Signature Date/Time: 01/31/2024/8:51:59 PM    Final      CT SCANS  CT CORONARY MORPH W/CTA COR W/SCORE 03/07/2021  Addendum 03/07/2021 12:54 PM ADDENDUM REPORT: 03/07/2021 12:51  CLINICAL DATA:  Chest pain  EXAM: Cardiac/Coronary  CTA  TECHNIQUE: A non-contrast, gated CT scan was obtained with axial slices of 3 mm through the heart for calcium  scoring. Calcium  scoring was performed using the Agatston method. A 120 kV prospective, gated,  contrast cardiac scan was obtained. Gantry rotation speed was 250 msecs and collimation was 0.6 mm. Two sublingual nitroglycerin  tablets (0.8 mg) were given. The 3D data set was reconstructed in 5% intervals of the 35-75% of the R-R cycle. Diastolic phases were analyzed on a dedicated workstation using MPR, MIP, and VRT modes. The patient received 95 cc of contrast.  FINDINGS: Image quality: Poor quality scan with significant stair-step artifact. Irregular heart rhythm during scan.  Noise artifact is: Limited.  Coronary Arteries:  Normal coronary origin.  Left dominance.  Left main: The left main is a large caliber vessel with a normal take off from the left coronary cusp that bifurcates to form a left anterior descending artery and a left circumflex artery. There is minimal calcified plaque (<25%).  Left anterior descending artery: There are heavily calcifications of the proximal to mid LAD. There is stair step artifact in this region and cannot be overcome by multi-phase assessment. This segment is non-diagnostic, and cannot exclude significant stenosis. The distal LAD appears patent.  Left circumflex artery: The LCX is dominant and appears patent with no evidence of plaque or stenosis. Significant stair step artifact noted. The LCX terminates as a patent PDA. OM1 is heavily calcified and contains at least a moderate (50-69%) calcified stenosis.  Right coronary artery: The RCA is non-dominant with normal take off from the right coronary cusp. The ostial RCA is heavily calcified and cannot exclude a severe stenosis (70-99%).  Right Atrium: Right atrial size is within normal limits.  Right Ventricle: The right ventricular cavity is within normal limits.  Left  Atrium: Left atrial size is normal in size with no left atrial appendage filling defect.  Left Ventricle: The ventricular cavity size is within normal limits. There are no stigmata of prior infarction. There is no abnormal filling defect.  Pulmonary arteries: Normal in size without proximal filling defect.  Pulmonary veins: Normal pulmonary venous drainage.  Pericardium: Normal thickness with no significant effusion or calcium  present.  Cardiac valves: The aortic valve is trileaflet without significant calcification. The mitral valve is normal structure without significant calcification.  Aorta: Normal caliber with no significant disease.  Extra-cardiac findings: See attached radiology report for non-cardiac structures.  IMPRESSION: 1. Coronary calcium  score of 620. This was 89th percentile for age-, sex, and race-matched controls.  2. Normal coronary origin with left dominance.  3. Poor quality study with poor heart rate control/irregular rhythm during exam.  4. Non-diagnostic proximal to mid LAD due to stair step artifact. Heavily calcified artery and cannot exclude moderate to severe stenosis.  5. Heavily calcified OM1 with concern for moderate stenosis (50-69%).  6. Heavily calcified ostial lesion of the non-dominant RCA and cannot exclude severe stenosis (70-99%).  7. CT FFR not sent as motion prevents accurate assessment.  RECOMMENDATIONS: 1. Non-diagnostic study. Obstructive CAD can't be excluded. Would recommend left heart catheterization.  Darryle Decent, MD   Electronically Signed By: Darryle Decent On: 03/07/2021 12:51  Narrative EXAM: OVER-READ INTERPRETATION  CT CHEST  The following report is an over-read performed by radiologist Dr. Juliene Balder of [facility] on 03/07/2021. This over-read does not include interpretation of cardiac or coronary anatomy or pathology. The coronary calcium  score/coronary CTA interpretation by the  cardiologist is attached.  COMPARISON:  Chest CTA 03/05/2021 and chest CT 11/25/2016  FINDINGS: Vascular: Visualized pulmonary arteries are patent. Atherosclerotic calcifications involving the thoracic aorta.  Mediastinum/Nodes: Visualized mediastinal structures are normal.  Lungs/Pleura: No large pleural effusions. Peripheral patchy densities  in lower lungs are suggestive atelectasis or scarring. No large areas of consolidation. No large pleural effusions. 3 mm nodular density in the left lower lobe on sequence 11, image 18 is along the left major fissure. This nodule appears to be stable since 2018 and most compatible with a benign nodule.  Upper Abdomen: Images of the upper abdomen are unremarkable.  Musculoskeletal: Bridging osteophytes in the visualized thoracic spine. No acute bone abnormality.  IMPRESSION: No acute abnormality involving the extracardiac structures.  Aortic Atherosclerosis (ICD10-I70.0).  Electronically Signed: By: Juliene Balder M.D. On: 03/07/2021 11:03     ______________________________________________________________________________________________       Current Reported Medications:.    Current Meds  Medication Sig   amLODipine  (NORVASC ) 5 MG tablet Take 5 mg by mouth daily.   apixaban  (ELIQUIS ) 5 MG TABS tablet Take 5 mg by mouth 2 (two) times daily.   JARDIANCE 10 MG TABS tablet Take 10 mg by mouth daily.   losartan  (COZAAR ) 50 MG tablet Take 50 mg by mouth daily.   MOUNJARO 5 MG/0.5ML Pen Inject 5 mg into the skin once a week.   nitroGLYCERIN  (NITROSTAT ) 0.4 MG SL tablet Take up to 3 tablets   pantoprazole  (PROTONIX ) 20 MG tablet Take 1 tablet (20 mg total) by mouth daily.   torsemide (DEMADEX) 20 MG tablet Take 40 mg by mouth once.    Physical Exam:    VS:  BP 126/62   Pulse 71   Ht 5' 4 (1.626 m)   Wt 273 lb (123.8 kg)   SpO2 96%   BMI 46.86 kg/m    Wt Readings from Last 3 Encounters:  02/29/24 273 lb (123.8 kg)  01/14/24 275  lb (124.7 kg)  04/19/23 260 lb (117.9 kg)    GEN: Well nourished, well developed in no acute distress NECK: No JVD; No carotid bruits CARDIAC: RRR, no murmurs, rubs, gallops RESPIRATORY:  Clear to auscultation without rales, wheezing or rhonchi  ABDOMEN: Soft, non-tender, non-distended EXTREMITIES:  No edema; No acute deformity     Asessement and Plan:.    CAD: NM stress test completed in 2022 showing ischemia and intermediate risk with normal EF and coronary CTA completed showing calcium  score of 620 with heavily calcified OM1 and ostial lesion with recommendation for LHC.  Patient has declined LHC, again declined today. Today she reports that she is doing well, denies any chest pain, noted that she did have some shortness of breath last month, reports this has resolved with starting of breathing treatments.  Continue amlodipine  5 mg daily, Eliquis  5 mg twice daily, Lipitor  40 mg daily, carvedilol  12.5 mg twice daily, losartan  50 mg daily and torsemide.  Atrial fibrillation: EKG today indicates normal sinus rhythm.  Patient denies any significant palpitations or feeling of atrial fibrillation.  She denies any bleeding problems on Eliquis . Check CBC and BMET today. Continue Eliuqis 5 mg twice daily and carvedilol    HFpEF/RV dysfunction: Echo in 2022 showing EF 55 to 60% with no RWMA and mild LVH with mildly elevated PASP and mild MVR.  Echo on 01/31/2024 which indicated LVEF 65 to 70%, no RWMA, mild concentric LVH, grade 1 diastolic dysfunction, RV cyst dysfunction was mildly reduced, RV mildly enlarged, moderately elevated pulmonary artery systolic pressures, no evidence of mitral valve regurgitation or stenosis, mild calcification of the aortic valve no stenosis or regurgitation was present. Today she reports that she has been doing well, she reports her prior shortness of breath and cough has completely resolved with  use of nebulizers.  She denies any chest pain at this time.  She overall appears  euvolemic and well compensated however difficult to overall ascertain given body habitus.  Discussed further evaluation with cardiac catheterization as noted above, patient continues to decline.  Reviewed ED precautions.  Continue carvedilol  12.5 mg twice daily, amlodipine  5 mg daily, torsemide 40 mg daily. Of note patient has history of hyperkalemia on spironolactone.  Type 2 DM: Last hemoglobin A1c 7.0 on 03/21/23. Monitored and managed per PCP.   History of venous insufficiency:  Patient followed by [facility].  Patient encouraged to elevate extremities when dependent and use compression hose.   Disposition: F/u with Dr. Santo in 5-6 months per patient request.   Signed, Shantelle Alles D Latha Staunton, NP

## 2024-02-29 ENCOUNTER — Ambulatory Visit: Attending: Cardiology | Admitting: Cardiology

## 2024-02-29 VITALS — BP 126/62 | HR 71 | Ht 64.0 in | Wt 273.0 lb

## 2024-02-29 DIAGNOSIS — I2510 Atherosclerotic heart disease of native coronary artery without angina pectoris: Secondary | ICD-10-CM

## 2024-02-29 DIAGNOSIS — I480 Paroxysmal atrial fibrillation: Secondary | ICD-10-CM

## 2024-02-29 DIAGNOSIS — E119 Type 2 diabetes mellitus without complications: Secondary | ICD-10-CM

## 2024-02-29 DIAGNOSIS — I5032 Chronic diastolic (congestive) heart failure: Secondary | ICD-10-CM

## 2024-02-29 DIAGNOSIS — I872 Venous insufficiency (chronic) (peripheral): Secondary | ICD-10-CM

## 2024-02-29 NOTE — Patient Instructions (Addendum)
 Medication Instructions:  Your physician recommends that you continue on your current medications as directed. Please refer to the Current Medication list given to you today.  *If you need a refill on your cardiac medications before your next appointment, please call your pharmacy*  Lab Work: CBC, BMET-TODAY If you have labs (blood work) drawn today and your tests are completely normal, you will receive your results only by: MyChart Message (if you have MyChart) OR A paper copy in the mail If you have any lab test that is abnormal or we need to change your treatment, we will call you to review the results.  Follow-Up: At [facility], you and your health needs are our priority.  As part of our continuing mission to provide you with exceptional heart care, our providers are all part of one team.  This team includes your primary Cardiologist (physician) and Advanced Practice Providers or APPs (Physician Assistants and Nurse Practitioners) who all work together to provide you with the care you need, when you need it.  Your next appointment:   06/30/2024 at 11:20 AM  Provider:   Stanly DELENA Leavens, MD    We recommend signing up for the patient portal called MyChart.  Sign up information is provided on this After Visit Summary.  MyChart is used to connect with patients for Virtual Visits (Telemedicine).  Patients are able to view lab/test results, encounter notes, upcoming appointments, etc.  Non-urgent messages can be sent to your provider as well.   To learn more about what you can do with MyChart, go to [email or web address].

## 2024-03-01 ENCOUNTER — Encounter: Payer: Self-pay | Admitting: Cardiology

## 2024-03-01 LAB — CBC
Hematocrit: 38.6 % (ref 34.0–46.6)
Hemoglobin: 11.7 g/dL (ref 11.1–15.9)
MCH: 25.7 pg — ABNORMAL LOW (ref 26.6–33.0)
MCHC: 30.3 g/dL — ABNORMAL LOW (ref 31.5–35.7)
MCV: 85 fL (ref 79–97)
Platelets: 258 x10E3/uL (ref 150–450)
RBC: 4.55 x10E6/uL (ref 3.77–5.28)
RDW: 13.6 % (ref 11.7–15.4)
WBC: 5.1 x10E3/uL (ref 3.4–10.8)

## 2024-03-01 LAB — BASIC METABOLIC PANEL WITH GFR
BUN/Creatinine Ratio: 27 (ref 12–28)
BUN: 39 mg/dL — ABNORMAL HIGH (ref 8–27)
CO2: 28 mmol/L (ref 20–29)
Calcium: 9 mg/dL (ref 8.7–10.3)
Chloride: 97 mmol/L (ref 96–106)
Creatinine, Ser: 1.43 mg/dL — ABNORMAL HIGH (ref 0.57–1.00)
Glucose: 112 mg/dL — ABNORMAL HIGH (ref 70–99)
Potassium: 4.4 mmol/L (ref 3.5–5.2)
Sodium: 139 mmol/L (ref 134–144)
eGFR: 36 mL/min/1.73 — ABNORMAL LOW (ref >59)

## 2024-03-02 ENCOUNTER — Ambulatory Visit: Payer: Self-pay | Admitting: Cardiology

## 2024-03-02 DIAGNOSIS — Z79899 Other long term (current) drug therapy: Secondary | ICD-10-CM

## 2024-03-04 NOTE — Telephone Encounter (Signed)
-----   Message from Katlyn D West sent at 03/02/2024 10:32 AM EDT ----- Please let Ms. Kohles know that her CBC shows improving hemoglobin and hematocrit, there is no evidence of infection. Her kidney function has had a slight decrease compared to lab work two months ago.  Her electrolytes are normal. Recommend decreasing her Torsemide to 20 mg daily, she can take an additional 20 mg once daily as needed for weight gain of 2-3 lbs overnight, 5 lbs in a week, increased  shortness of breath or worsening lower extremity edema. Recommend repeat BMET in 2 weeks for monitoring.  ----- Message ----- From: Interface, [facility] Lab Results In Sent: 02/29/2024  11:35 PM EDT To: Katlyn D West, NP

## 2024-03-04 NOTE — Telephone Encounter (Signed)
 Left message to call back

## 2024-03-05 ENCOUNTER — Telehealth: Payer: Self-pay | Admitting: Internal Medicine

## 2024-03-05 NOTE — Telephone Encounter (Signed)
  Per answering service, patient is returning a call from the office

## 2024-03-05 NOTE — Telephone Encounter (Signed)
 Left message for patient to call back

## 2024-03-06 NOTE — Telephone Encounter (Signed)
 Patient returned RN's call.

## 2024-03-06 NOTE — Telephone Encounter (Signed)
 Left message with call back number.  Unsure what patient is calling about

## 2024-03-06 NOTE — Telephone Encounter (Signed)
 Patient is currently at [facility] have to talk to on call nurse. Fax number is [phone]

## 2024-03-14 IMAGING — CT CT HEAD W/O CM
4 series · 16 of 47 positions shown, 18 images · non-contrast
Comparison: 09/30/2021

CLINICAL DATA: Altered mental status, slurred speech



[Series 2: head wo · axial · 0.47mm/px · z∈[+1402,+1518]mm · 7 of 31 slices shown, 9 images]
[im 4/31  brain]
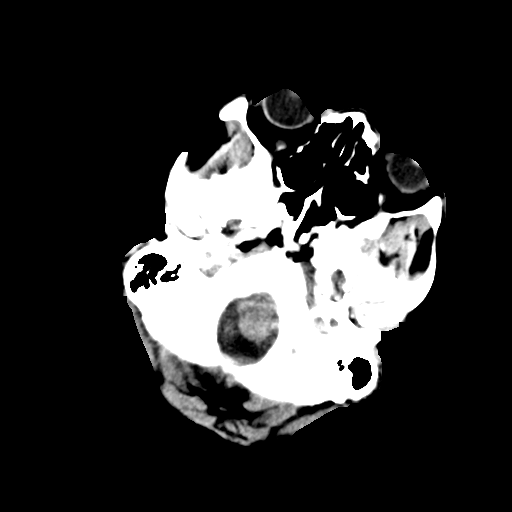
[im 4/31  bone]
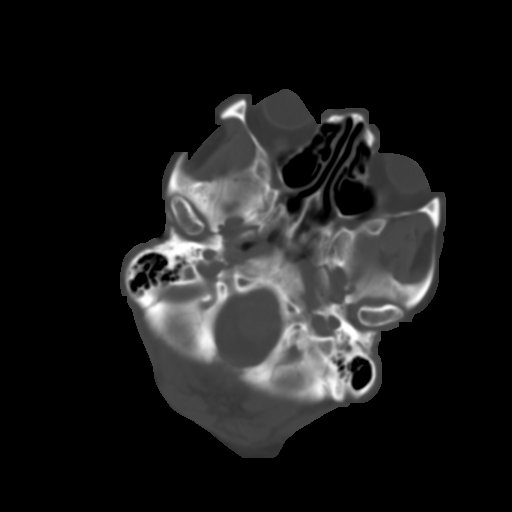
[im 8/31  brain]
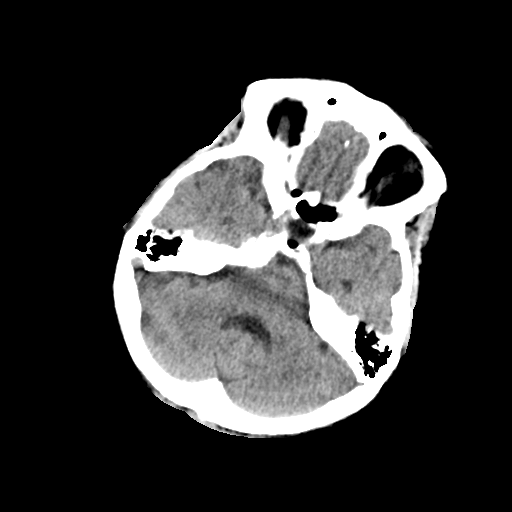
[im 12/31  brain]
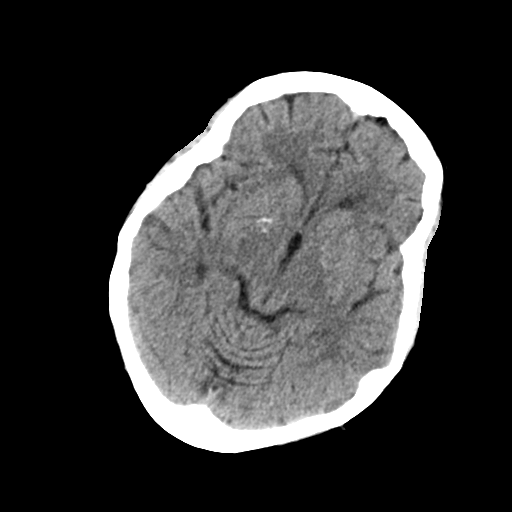
[im 16/31  brain]
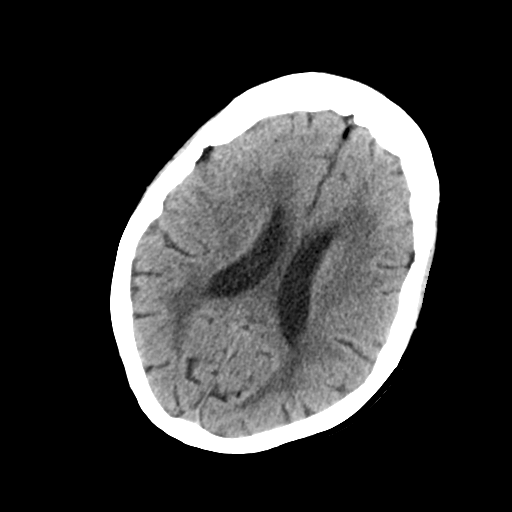
[im 19/31  brain]
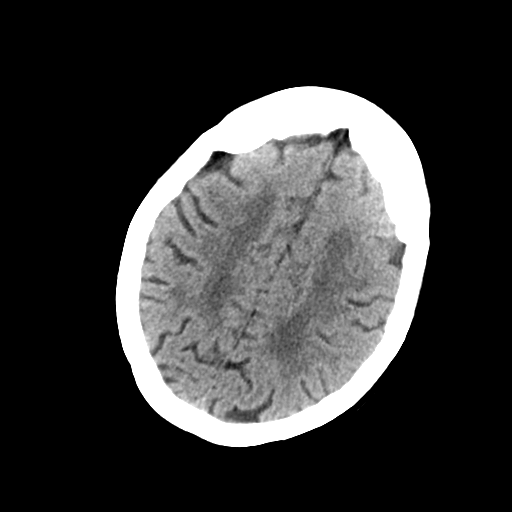
[im 19/31  bone]
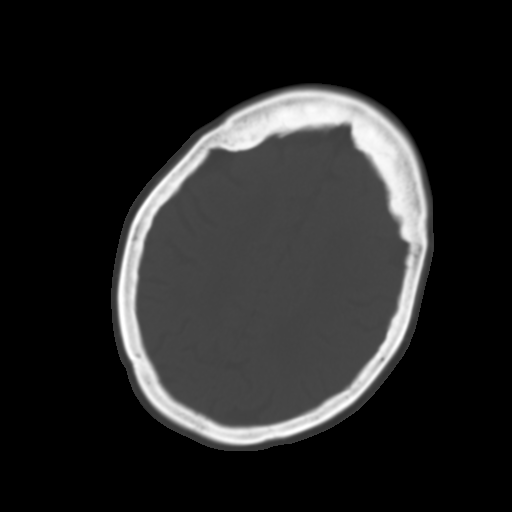
[im 23/31  brain]
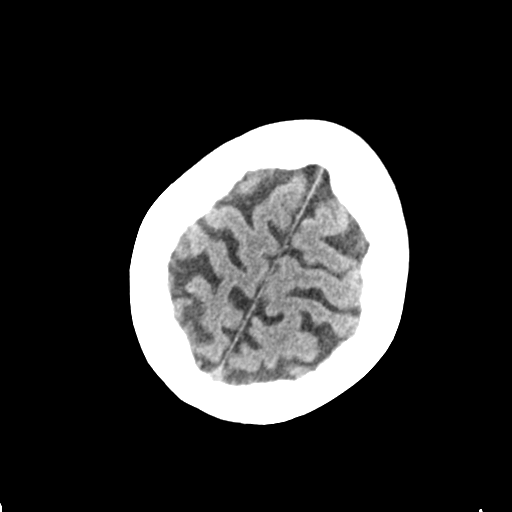
[im 27/31  brain]
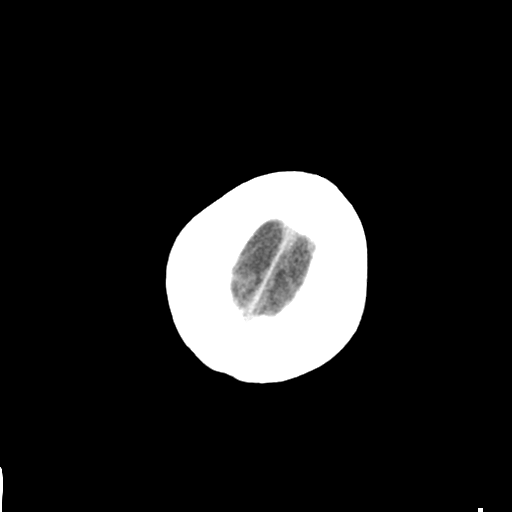

[Series 3: head bone · axial · 0.47mm/px · z∈[+1402,+1432]mm · 3 of 76 slices shown]
[im 8/76  bone]
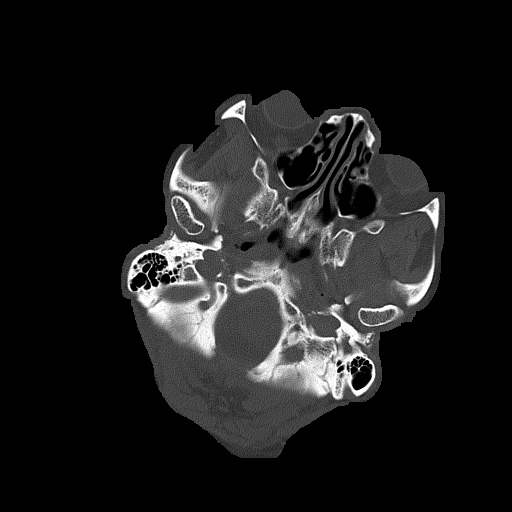
[im 16/76  bone]
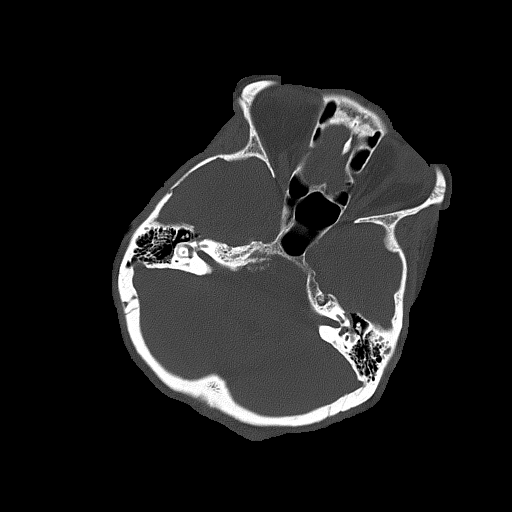
[im 23/76  bone]
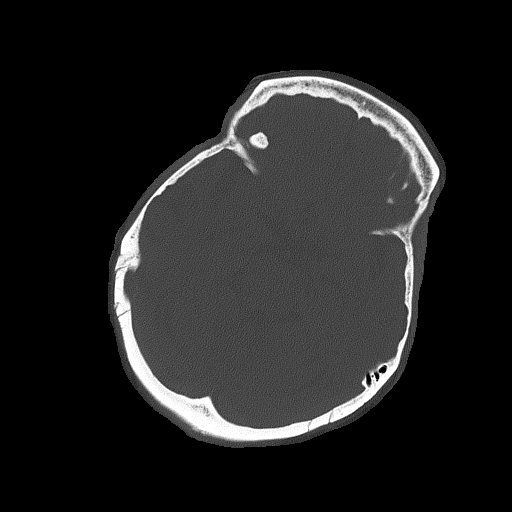

[Series 4: coronal soft tissue · coronal · 0.37mm/px · 3 of 65 slices shown]
[im 22/65  brain]
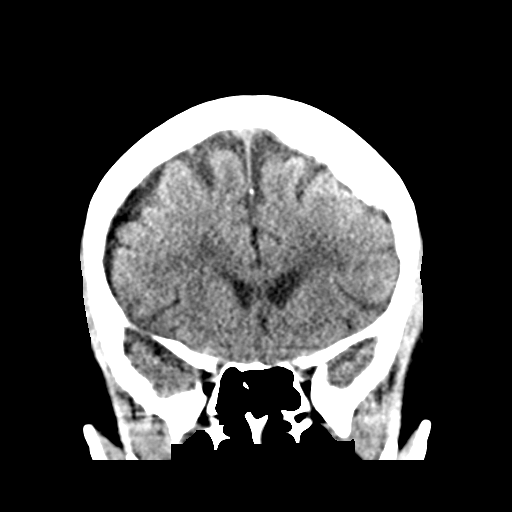
[im 29/65  brain]
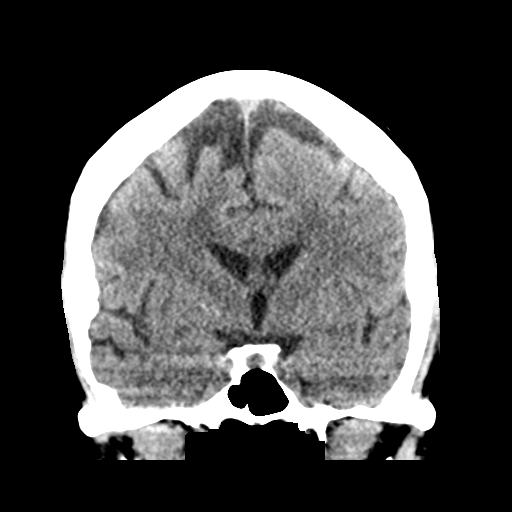
[im 36/65  brain]
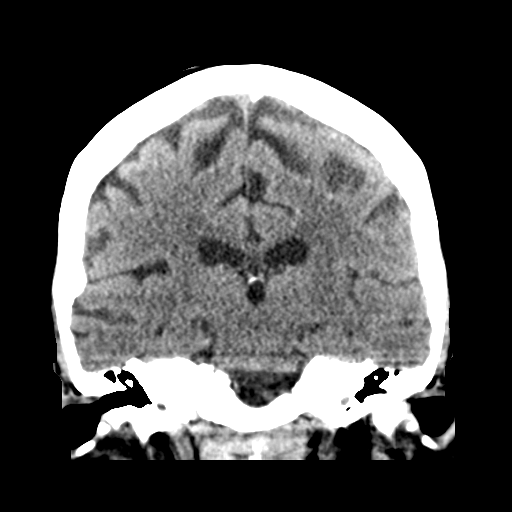

[Series 5: sagittal soft tissue · sagittal · 0.38mm/px · 3 of 51 slices shown]
[im 17/51  brain]
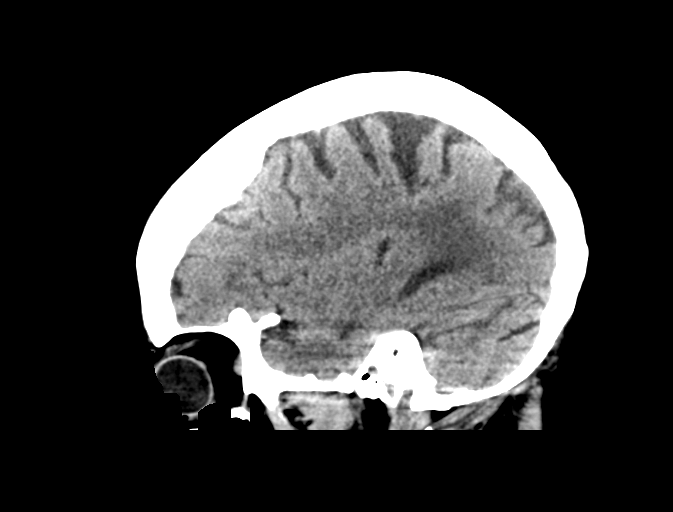
[im 26/51  brain]
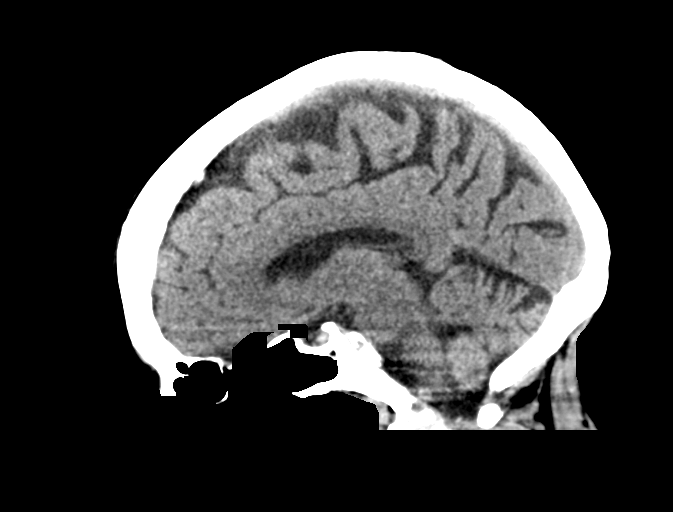
[im 34/51  brain]
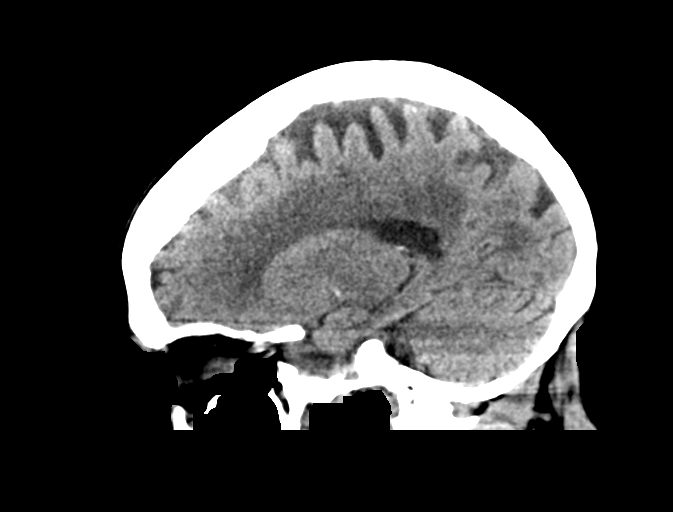

[16 of 47 positions shown; findings below may reference images not displayed]

FINDINGS: Brain: Generalized atrophy. Normal ventricular morphology. No
midline shift or mass effect. Small vessel chronic ischemic changes
of deep cerebral white matter. No intracranial hemorrhage, mass
lesion, evidence of acute infarction, or extra-axial fluid
collection.

Vascular: Mild atherosclerotic calcifications at skull base

Skull: Intact

Sinuses/Orbits: Clear

Other: N/A
IMPRESSION: Atrophy with small vessel chronic ischemic changes of deep cerebral
white matter.

No acute intracranial abnormalities.

## 2024-03-17 ENCOUNTER — Ambulatory Visit (INDEPENDENT_AMBULATORY_CARE_PROVIDER_SITE_OTHER): Admitting: Pulmonary Disease

## 2024-03-17 ENCOUNTER — Encounter

## 2024-03-17 DIAGNOSIS — R0602 Shortness of breath: Secondary | ICD-10-CM

## 2024-03-17 DIAGNOSIS — R053 Chronic cough: Secondary | ICD-10-CM

## 2024-03-17 LAB — PULMONARY FUNCTION TEST
DL/VA % pred: 122 %
DL/VA: 4.96 ml/min/mmHg/L
DLCO cor % pred: 51 %
DLCO cor: 9.46 ml/min/mmHg
DLCO unc % pred: 48 %
DLCO unc: 8.93 ml/min/mmHg
FEF 25-75 Post: 1.29 L/s
FEF 25-75 Pre: 0.77 L/s
FEF2575-%Change-Post: 65 %
FEF2575-%Pred-Post: 111 %
FEF2575-%Pred-Pre: 67 %
FEV1-%Change-Post: 76 %
FEV1-%Pred-Post: 56 %
FEV1-%Pred-Pre: 31 %
FEV1-Post: 0.99 L
FEV1-Pre: 0.56 L
FEV1FVC-%Change-Post: 0 %
FEV1FVC-%Pred-Pre: 132 %
FEV6-%Change-Post: 73 %
FEV6-%Pred-Post: 45 %
FEV6-%Pred-Pre: 25 %
FEV6-Post: 1.01 L
FEV6-Pre: 0.58 L
FEV6FVC-%Pred-Post: 106 %
FEV6FVC-%Pred-Pre: 106 %
FVC-%Change-Post: 78 %
FVC-%Pred-Post: 43 %
FVC-%Pred-Pre: 24 %
FVC-Post: 1.03 L
FVC-Pre: 0.58 L
Post FEV1/FVC ratio: 96 %
Post FEV6/FVC ratio: 100 %
Pre FEV1/FVC ratio: 97 %
Pre FEV6/FVC Ratio: 100 %

## 2024-03-17 NOTE — Progress Notes (Signed)
 Full pft attempted, see notes attached to pft.

## 2024-03-17 NOTE — Patient Instructions (Signed)
 Full pft attempted, see notes attached to pft.

## 2024-03-21 ENCOUNTER — Ambulatory Visit: Admitting: Surgical

## 2024-03-21 ENCOUNTER — Encounter: Payer: Self-pay | Admitting: Surgical

## 2024-03-21 DIAGNOSIS — G8929 Other chronic pain: Secondary | ICD-10-CM

## 2024-03-21 DIAGNOSIS — M25511 Pain in right shoulder: Secondary | ICD-10-CM

## 2024-03-21 NOTE — Progress Notes (Signed)
 Patient was referred here for right shoulder pain.  She reportedly had right shoulder injection that has given her great relief and her right shoulder is doing very well.  She also uses a TENS unit for some bicep region pain.  She says that she is feeling so good that she does not know why she is here today.  She does not want to be seen and she would like to go back to her facility.  She was recommended to return if she has return of her right shoulder pain and our contact information was provided for her.

## 2024-04-26 ENCOUNTER — Ambulatory Visit: Payer: Self-pay | Admitting: Student in an Organized Health Care Education/Training Program

## 2024-04-28 NOTE — Telephone Encounter (Signed)
-----   Message from [place] sent at 04/26/2024  1:54 PM EDT ----- Breathing test shows multiple abnormalities. Patient should schedule a follow up appointment to discuss all these results.  Thanks [employer] ----- Message ----- From: Interface, Lab In [facility] One Sent: 03/17/2024  11:44 AM EDT To: Belva November, MD

## 2024-04-28 NOTE — Telephone Encounter (Signed)
 LMTCB. E2C2 please advise when patient calls back.

## 2024-06-30 ENCOUNTER — Ambulatory Visit: Attending: Internal Medicine | Admitting: Internal Medicine

## 2024-06-30 VITALS — BP 133/73 | HR 69 | Ht 64.0 in | Wt 274.0 lb

## 2024-06-30 DIAGNOSIS — I4892 Unspecified atrial flutter: Secondary | ICD-10-CM

## 2024-06-30 DIAGNOSIS — E1169 Type 2 diabetes mellitus with other specified complication: Secondary | ICD-10-CM

## 2024-06-30 DIAGNOSIS — I4891 Unspecified atrial fibrillation: Secondary | ICD-10-CM

## 2024-06-30 DIAGNOSIS — I2510 Atherosclerotic heart disease of native coronary artery without angina pectoris: Secondary | ICD-10-CM | POA: Diagnosis not present

## 2024-06-30 DIAGNOSIS — E785 Hyperlipidemia, unspecified: Secondary | ICD-10-CM

## 2024-06-30 NOTE — Progress Notes (Signed)
 Cardiology Office Note:  .    Date:  06/30/2024  ID:  Kayla [name], DOB Dec 06, 1940, MRN [id number] PCP: Marston Junette HERO, MD  [facility] Providers Cardiologist:  Stanly DELENA Leavens, MD     CC: Re-establish care   History of Present Illness: .     Kayla [name] is a 83 y.o. female with a history of HTN, hx of PE managed by Dr. Lorren, Hx of AFL.    Kayla [name] is an 83 year old female with obstructive coronary artery disease and atrial flutter who presents for follow-up regarding her cardiac conditions.  She has a history of obstructive coronary artery disease with a calcified OM1. She has declined heart catheterization multiple times in the past. She remains mostly asymptomatic, experiencing only occasional chest discomfort that is neither regular nor significant.  She has a history of atrial flutter and atrial fibrillation. She was previously placed on a blood thinner to reduce the risk of stroke, which she has tolerated well. Her echocardiogram from July 2025 showed a mild decrease in right-sided heart function and some calcium  buildup on a valve, but overall heart function was preserved.  She is currently residing at [facility] for hyperkalemia and severe protein-calorie malnutrition. Her blood pressure and blood sugar levels have been well controlled. She has been at [facility] for approximately three years and engages in some physical activity, such as walking 90 feet, which she feels good about.  She takes torsemide for fluid management. Her blood counts have been stable with no evidence of bleeding issues.   Discussed the use of AI scribe software for clinical note transcription with the patient, who gave verbal consent to proceed.   Relevant histories: .  Social  2019- Established with Dr. Anner. 2022: Established with me 2025: [facility]; me and Katlyn West ROS: As per HPI.   Studies Reviewed: .     Cardiac Studies & Procedures    ______________________________________________________________________________________________   STRESS TESTS  NM MYOCAR MULTI W/SPECT W 03/06/2021  Narrative  There was no ST segment deviation noted during stress.  Findings consistent with ischemia.  This is an intermediate risk study.  The left ventricular ejection fraction is hyperdynamic (>65%).  Nuclear stress EF: 66%.  There is a medium size defect of mild severity present in the basal anterior, mid anterior, apical anterior and apex location. Grossly normal wall motion. LVEF 66%. Findings suggest ischemia in LAD distribution. Intermediate risk study due to TID 1.58 and medium size defect.   ECHOCARDIOGRAM  ECHOCARDIOGRAM COMPLETE 01/31/2024  Narrative ECHOCARDIOGRAM REPORT    Patient Name:   Kayla [name] Date of Exam: 01/31/2024 Medical Rec #:  [id number]       Height:       64.0 in Accession #:    [id number]      Weight:       275.0 lb Date of Birth:  10/14/1940        BSA:          2.240 m Patient Age:    83 years        BP:           159/78 mmHg Patient Gender: F               HR:           76 bpm. Exam Location:  Outpatient  Procedure: 2D Echo, 3D Echo, Cardiac Doppler, Color Doppler and Strain Analysis (Both Spectral and Color Flow  Doppler were utilized during procedure).  Indications:    I50.40* Unspecified combined systolic (congestive) and diastolic (congestive) heart failure  History:        Patient has prior history of Echocardiogram examinations, most recent 03/05/2021. Abnormal ECG, Arrythmias:Atrial Fibrillation and Atrial Flutter, Signs/Symptoms:Edema; Risk Factors:Hypertension and Diabetes. Pulmonary embolus.  Sonographer:    Ellouise Mose RDCS Referring Phys: JUNETTE HERO [facility]   Sonographer Comments: Technically difficult study due to poor echo windows and patient is obese. Image acquisition challenging due to patient body habitus. IMPRESSIONS   1. Left ventricular ejection fraction, by  estimation, is 65 to 70%. Left ventricular ejection fraction by 3D volume is 65 %. The left ventricle has normal function. The left ventricle has no regional wall motion abnormalities. There is mild concentric left ventricular hypertrophy. Left ventricular diastolic parameters are consistent with Grade I diastolic dysfunction (impaired relaxation). The global longitudinal strain is normal. 2. Right ventricular systolic function is mildly reduced. The right ventricular size is mildly enlarged. There is moderately elevated pulmonary artery systolic pressure. The estimated right ventricular systolic pressure is 37.6 mmHg. 3. The mitral valve is degenerative. No evidence of mitral valve regurgitation. No evidence of mitral stenosis. 4. The aortic valve is tricuspid. There is mild calcification of the aortic valve. Aortic valve regurgitation is not visualized. No aortic stenosis is present.  FINDINGS Left Ventricle: Left ventricular ejection fraction, by estimation, is 65 to 70%. Left ventricular ejection fraction by 3D volume is 65 %. The left ventricle has normal function. The left ventricle has no regional wall motion abnormalities. Strain was performed and the global longitudinal strain is normal. The left ventricular internal cavity size was normal in size. There is mild concentric left ventricular hypertrophy. Left ventricular diastolic parameters are consistent with Grade I diastolic dysfunction (impaired relaxation).  Right Ventricle: The right ventricular size is mildly enlarged. No increase in right ventricular wall thickness. Right ventricular systolic function is mildly reduced. There is moderately elevated pulmonary artery systolic pressure. The tricuspid regurgitant velocity is 2.72 m/s, and with an assumed right atrial pressure of 8 mmHg, the estimated right ventricular systolic pressure is 37.6 mmHg.  Left Atrium: Left atrial size was normal in size.  Right Atrium: Right atrial size was  normal in size.  Pericardium: There is no evidence of pericardial effusion.  Mitral Valve: The mitral valve is degenerative in appearance. There is moderate thickening of the mitral valve leaflet(s). There is moderate calcification of the anterior mitral valve leaflet(s). Normal mobility of the mitral valve leaflets. No evidence of mitral valve regurgitation. No evidence of mitral valve stenosis.  Tricuspid Valve: The tricuspid valve is not well visualized. Tricuspid valve regurgitation is mild . No evidence of tricuspid stenosis.  Aortic Valve: The aortic valve is tricuspid. There is mild calcification of the aortic valve. There is mild aortic valve annular calcification. Aortic valve regurgitation is not visualized. No aortic stenosis is present.  Pulmonic Valve: The pulmonic valve was normal in structure. Pulmonic valve regurgitation is not visualized. No evidence of pulmonic stenosis.  Aorta: The aortic root and ascending aorta are structurally normal, with no evidence of dilitation.  IAS/Shunts: No atrial level shunt detected by color flow Doppler.  Additional Comments: 3D was performed not requiring image post processing on an independent workstation and was normal.   LEFT VENTRICLE PLAX 2D LVIDd:         3.90 cm         Diastology LVIDs:  2.70 cm         LV e' medial:    4.46 cm/s LV PW:         1.10 cm         LV E/e' medial:  10.5 LV IVS:        1.30 cm         LV e' lateral:   7.51 cm/s LVOT diam:     2.20 cm         LV E/e' lateral: 6.3 LV SV:         77 LV SV Index:   34 LVOT Area:     3.80 cm        3D Volume EF LV 3D EF:    Left ventricul LV Volumes (MOD)                            ar LV vol d, MOD    41.9 ml                    ejection A2C:                                        fraction LV vol d, MOD    61.5 ml                    by 3D A4C:                                        volume is LV vol s, MOD    15.2 ml                    65 %. A2C: LV vol s,  MOD    17.2 ml A4C:                           3D Volume EF: LV SV MOD A2C:   26.7 ml       3D EF:        65 % LV SV MOD A4C:   61.5 ml       LV EDV:       136 ml LV SV MOD BP:    33.7 ml       LV ESV:       48 ml LV SV:        89 ml  RIGHT VENTRICLE             IVC RV S prime:     15.90 cm/s  IVC diam: 1.30 cm TAPSE (M-mode): 2.0 cm  LEFT ATRIUM             Index        RIGHT ATRIUM          Index LA diam:        3.30 cm 1.47 cm/m   RA Area:     9.70 cm LA Vol (A2C):   13.4 ml 5.98 ml/m   RA Volume:   18.60 ml 8.30 ml/m LA Vol (A4C):   32.6 ml 14.55 ml/m LA Biplane Vol: 21.1 ml 9.42 ml/m AORTIC VALVE LVOT Vmax:   94.00 cm/s LVOT Vmean:  60.300  cm/s LVOT VTI:    0.203 m  AORTA Ao Root diam: 2.90 cm Ao Asc diam:  3.10 cm  MITRAL VALVE               TRICUSPID VALVE MV Area (PHT): 2.53 cm    TR Peak grad:   29.6 mmHg MV Decel Time: 300 msec    TR Vmax:        272.00 cm/s MV E velocity: 47.00 cm/s MV A velocity: 89.75 cm/s  SHUNTS MV E/A ratio:  0.52        Systemic VTI:  0.20 m Systemic Diam: 2.20 cm  Aditya Sabharwal Electronically signed by Ria Commander Signature Date/Time: 01/31/2024/8:51:59 PM    Final      CT SCANS  CT CORONARY MORPH W/CTA COR W/SCORE 03/07/2021  Addendum 03/07/2021 12:54 PM ADDENDUM REPORT: 03/07/2021 12:51  CLINICAL DATA:  Chest pain  EXAM: Cardiac/Coronary CTA  TECHNIQUE: A non-contrast, gated CT scan was obtained with axial slices of 3 mm through the heart for calcium  scoring. Calcium  scoring was performed using the Agatston method. A 120 kV prospective, gated, contrast cardiac scan was obtained. Gantry rotation speed was 250 msecs and collimation was 0.6 mm. Two sublingual nitroglycerin  tablets (0.8 mg) were given. The 3D data set was reconstructed in 5% intervals of the 35-75% of the R-R cycle. Diastolic phases were analyzed on a dedicated workstation using MPR, MIP, and VRT modes. The patient received 95 cc of  contrast.  FINDINGS: Image quality: Poor quality scan with significant stair-step artifact. Irregular heart rhythm during scan.  Noise artifact is: Limited.  Coronary Arteries:  Normal coronary origin.  Left dominance.  Left main: The left main is a large caliber vessel with a normal take off from the left coronary cusp that bifurcates to form a left anterior descending artery and a left circumflex artery. There is minimal calcified plaque (<25%).  Left anterior descending artery: There are heavily calcifications of the proximal to mid LAD. There is stair step artifact in this region and cannot be overcome by multi-phase assessment. This segment is non-diagnostic, and cannot exclude significant stenosis. The distal LAD appears patent.  Left circumflex artery: The LCX is dominant and appears patent with no evidence of plaque or stenosis. Significant stair step artifact noted. The LCX terminates as a patent PDA. OM1 is heavily calcified and contains at least a moderate (50-69%) calcified stenosis.  Right coronary artery: The RCA is non-dominant with normal take off from the right coronary cusp. The ostial RCA is heavily calcified and cannot exclude a severe stenosis (70-99%).  Right Atrium: Right atrial size is within normal limits.  Right Ventricle: The right ventricular cavity is within normal limits.  Left Atrium: Left atrial size is normal in size with no left atrial appendage filling defect.  Left Ventricle: The ventricular cavity size is within normal limits. There are no stigmata of prior infarction. There is no abnormal filling defect.  Pulmonary arteries: Normal in size without proximal filling defect.  Pulmonary veins: Normal pulmonary venous drainage.  Pericardium: Normal thickness with no significant effusion or calcium  present.  Cardiac valves: The aortic valve is trileaflet without significant calcification. The mitral valve is normal structure  without significant calcification.  Aorta: Normal caliber with no significant disease.  Extra-cardiac findings: See attached radiology report for non-cardiac structures.  IMPRESSION: 1. Coronary calcium  score of 620. This was 89th percentile for age-, sex, and race-matched controls.  2. Normal coronary origin with left dominance.  3. Poor  quality study with poor heart rate control/irregular rhythm during exam.  4. Non-diagnostic proximal to mid LAD due to stair step artifact. Heavily calcified artery and cannot exclude moderate to severe stenosis.  5. Heavily calcified OM1 with concern for moderate stenosis (50-69%).  6. Heavily calcified ostial lesion of the non-dominant RCA and cannot exclude severe stenosis (70-99%).  7. CT FFR not sent as motion prevents accurate assessment.  RECOMMENDATIONS: 1. Non-diagnostic study. Obstructive CAD can't be excluded. Would recommend left heart catheterization.  Darryle Decent, MD   Electronically Signed By: Darryle Decent On: 03/07/2021 12:51  Narrative EXAM: OVER-READ INTERPRETATION  CT CHEST  The following report is an over-read performed by radiologist Dr. Juliene Balder of [facility] on 03/07/2021. This over-read does not include interpretation of cardiac or coronary anatomy or pathology. The coronary calcium  score/coronary CTA interpretation by the cardiologist is attached.  COMPARISON:  Chest CTA 03/05/2021 and chest CT 11/25/2016  FINDINGS: Vascular: Visualized pulmonary arteries are patent. Atherosclerotic calcifications involving the thoracic aorta.  Mediastinum/Nodes: Visualized mediastinal structures are normal.  Lungs/Pleura: No large pleural effusions. Peripheral patchy densities in lower lungs are suggestive atelectasis or scarring. No large areas of consolidation. No large pleural effusions. 3 mm nodular density in the left lower lobe on sequence 11, image 18 is along the left major fissure. This  nodule appears to be stable since 2018 and most compatible with a benign nodule.  Upper Abdomen: Images of the upper abdomen are unremarkable.  Musculoskeletal: Bridging osteophytes in the visualized thoracic spine. No acute bone abnormality.  IMPRESSION: No acute abnormality involving the extracardiac structures.  Aortic Atherosclerosis (ICD10-I70.0).  Electronically Signed: By: Juliene Balder M.D. On: 03/07/2021 11:03     ______________________________________________________________________________________________       Physical Exam:    VS:  BP 133/73   Pulse 69   Ht 5' 4 (1.626 m)   Wt 274 lb (124.3 kg)   SpO2 95%   BMI 47.03 kg/m    Wt Readings from Last 3 Encounters:  06/30/24 274 lb (124.3 kg)  02/29/24 273 lb (123.8 kg)  01/14/24 275 lb (124.7 kg)    Gen: no distress, morbid obesity Neck: No JVD Cardiac: No Rubs or Gallops, no Murmur, RRR +2 radial pulses; distant heart sounds Respiratory: Clear to auscultation bilaterally, normal effort, normal  respiratory rate GI: Soft, nontender, non-distended  MS: No  edema;  moves all extremities Integument: Skin feels warm Neuro:  At time of evaluation, alert and oriented to person/place/time/situation  Psych: Normal affect, patient feels ok     ASSESSMENT AND PLAN: .    Obstructive coronary artery disease with calcified OM1 - Asymptomatic with no significant chest pain. Previous CT evaluations showed calcified OM1. Echocardiogram from July 2025 indicated preserved heart function with mild right-sided decrease and calcium  buildup on a valve. No significant structural changes or symptoms warranting intervention.  SDM for [place]; deferred at this time. - Continue current medical management. - Will consider heart catheterization if significant angina develops.  Atrial fibrillation and atrial flutter - Currently in rhythm with no evidence of silent atrial fibrillation or flutter. Echocardiogram from 01/31/24 showed normal  atrial size, indicating no structural remodeling. Blood thinner therapy is well-tolerated with no bleeding issues. - Continue blood thinner therapy to reduce stroke risk; OSH labs WNL - Monitor for symptoms or structural changes that may necessitate more aggressive treatment.  Hypertension Blood pressure is well-controlled with current management. Recent readings from [place] and current visit are within target range.  Hyperlipidemia Cholesterol levels are well-controlled with LDL at 70 or less.  Peripheral edema Mild peripheral edema noted, but no significant pitting edema despite dietary indiscretion. Current diuretic therapy is effective.  Time:   I have spent a total of 40 minutes with the patient reviewing notes, imaging, EKGs, labs, and examining the patient as well as establishing an assessment and plan that was discussed personally with the patient. Discussed disease state education, SDM, and coordinating care [facility]).  Because we were unable to reach her son Jerel to give detailed summary, we wrote a special after visit summary for him as well as for the [facility].  One year with me or Katlyn   Sinjin Amero, MD FASE [facility] Cardiologist [facility]  [address] [place], [address] [place], [place] [address] [phone]  12:14 PM

## 2024-06-30 NOTE — Patient Instructions (Addendum)
 VISIT SUMMARY (for Kayla [name])  Today, you had a follow-up appointment to review your cardiac conditions, including obstructive coronary artery disease, atrial flutter, and other related health issues. Overall, your heart function is stable, and your current treatments are effectively managing your conditions.  Because we were not able to get Kayla [name] on the phone, we wanted to compose a straightforward MyChart Message  YOUR PLAN:  -OBSTRUCTIVE CORONARY ARTERY DISEASE WITH CALCIFIED OM1: This condition involves the narrowing of your coronary arteries due to calcium  buildup. You are currently asymptomatic with no significant chest pain. We will continue with your current medical management and consider heart catheterization if you develop significant chest pain.  -ATRIAL FIBRILLATION AND ATRIAL FLUTTER: These are irregular heart rhythms that can increase the risk of stroke. Your heart is currently in rhythm, and you are tolerating your blood thinner therapy well. We will continue this therapy to reduce your stroke risk and monitor for any symptoms or changes.  -HYPERTENSION: This is high blood pressure. Your blood pressure is well-controlled with your current medications. We will continue your current treatment plan.  -HYPERLIPIDEMIA: This is high cholesterol. Your cholesterol levels are well-controlled with your current medication. We will continue this treatment.  -PERIPHERAL EDEMA: This is swelling in your lower legs. You have mild swelling, but it is well-managed with your current diuretic therapy. We will continue this treatment.  I suspect it is related to the gumbo  INSTRUCTIONS:  Please continue with your current medications and therapies as discussed. If you experience any new or worsening symptoms, contact our office immediately. We will consider further interventions if significant symptoms develop.   Medication Instructions:   NO CHANGES  *If you need a refill on your cardiac medications  before your next appointment, please call your pharmacy*  Follow-Up: At [facility], you and your health needs are our priority.  As part of our continuing mission to provide you with exceptional heart care, our providers are all part of one team.  This team includes your primary Cardiologist (physician) and Advanced Practice Providers or APPs (Physician Assistants and Nurse Practitioners) who all work together to provide you with the care you need, when you need it.  Your next appointment:    12 months with Dr. Santo  We recommend signing up for the patient portal called MyChart.  Sign up information is provided on this After Visit Summary.  MyChart is used to connect with patients for Virtual Visits (Telemedicine).  Patients are able to view lab/test results, encounter notes, upcoming appointments, etc.  Non-urgent messages can be sent to your provider as well.   To learn more about what you can do with MyChart, go to [email or web address].   Other Instructions
